# Patient Record
Sex: Female | Born: 1950 | Race: Black or African American | Hispanic: No | Marital: Married | Smoking: Former smoker
Health system: Southern US, Community
[De-identification: ages and names within clinical notes are randomized; demographics above are authoritative.]

## PROBLEM LIST (undated history)

## (undated) DIAGNOSIS — E119 Type 2 diabetes mellitus without complications: Secondary | ICD-10-CM

## (undated) DIAGNOSIS — Z8614 Personal history of Methicillin resistant Staphylococcus aureus infection: Secondary | ICD-10-CM

## (undated) DIAGNOSIS — C801 Malignant (primary) neoplasm, unspecified: Secondary | ICD-10-CM

## (undated) DIAGNOSIS — N183 Chronic kidney disease, stage 3 unspecified: Secondary | ICD-10-CM

## (undated) DIAGNOSIS — D649 Anemia, unspecified: Secondary | ICD-10-CM

## (undated) DIAGNOSIS — I10 Essential (primary) hypertension: Secondary | ICD-10-CM

## (undated) DIAGNOSIS — C55 Malignant neoplasm of uterus, part unspecified: Secondary | ICD-10-CM

## (undated) DIAGNOSIS — N189 Chronic kidney disease, unspecified: Secondary | ICD-10-CM

## (undated) DIAGNOSIS — M1990 Unspecified osteoarthritis, unspecified site: Secondary | ICD-10-CM

## (undated) DIAGNOSIS — I2510 Atherosclerotic heart disease of native coronary artery without angina pectoris: Secondary | ICD-10-CM

## (undated) DIAGNOSIS — I421 Obstructive hypertrophic cardiomyopathy: Secondary | ICD-10-CM

## (undated) DIAGNOSIS — E669 Obesity, unspecified: Secondary | ICD-10-CM

## (undated) DIAGNOSIS — L409 Psoriasis, unspecified: Secondary | ICD-10-CM

## (undated) DIAGNOSIS — I639 Cerebral infarction, unspecified: Secondary | ICD-10-CM

## (undated) DIAGNOSIS — K219 Gastro-esophageal reflux disease without esophagitis: Secondary | ICD-10-CM

## (undated) DIAGNOSIS — E785 Hyperlipidemia, unspecified: Secondary | ICD-10-CM

## (undated) HISTORY — DX: Hypercalcemia: E83.52

## (undated) HISTORY — DX: Essential (primary) hypertension: I10

## (undated) HISTORY — DX: Personal history of Methicillin resistant Staphylococcus aureus infection: Z86.14

## (undated) HISTORY — DX: Obesity, unspecified: E66.9

## (undated) HISTORY — DX: Malignant neoplasm of uterus, part unspecified: C55

## (undated) HISTORY — DX: Chronic kidney disease, stage 3 unspecified: N18.30

## (undated) HISTORY — PX: ABDOMINAL HYSTERECTOMY: SHX81

## (undated) HISTORY — PX: OTHER SURGICAL HISTORY: SHX169

## (undated) HISTORY — DX: Chronic kidney disease, unspecified: N18.9

## (undated) HISTORY — DX: Type 2 diabetes mellitus without complications: E11.9

## (undated) HISTORY — PX: PARTIAL HYSTERECTOMY: SHX80

## (undated) HISTORY — DX: Obstructive hypertrophic cardiomyopathy: I42.1

## (undated) HISTORY — DX: Psoriasis, unspecified: L40.9

## (undated) HISTORY — DX: Hyperlipidemia, unspecified: E78.5

---

## 2002-03-13 ENCOUNTER — Ambulatory Visit (HOSPITAL_COMMUNITY): Admission: RE | Admit: 2002-03-13 | Discharge: 2002-03-13 | Payer: Self-pay | Admitting: Internal Medicine

## 2002-09-25 ENCOUNTER — Ambulatory Visit (HOSPITAL_COMMUNITY): Admission: RE | Admit: 2002-09-25 | Discharge: 2002-09-25 | Payer: Self-pay | Admitting: Family Medicine

## 2002-09-25 ENCOUNTER — Encounter: Payer: Self-pay | Admitting: Family Medicine

## 2003-11-11 ENCOUNTER — Ambulatory Visit (HOSPITAL_COMMUNITY): Admission: RE | Admit: 2003-11-11 | Discharge: 2003-11-11 | Payer: Self-pay | Admitting: Family Medicine

## 2003-12-28 ENCOUNTER — Ambulatory Visit: Payer: Self-pay | Admitting: *Deleted

## 2003-12-29 ENCOUNTER — Ambulatory Visit (HOSPITAL_COMMUNITY): Admission: RE | Admit: 2003-12-29 | Discharge: 2003-12-29 | Payer: Self-pay | Admitting: Family Medicine

## 2003-12-30 ENCOUNTER — Ambulatory Visit: Payer: Self-pay | Admitting: *Deleted

## 2003-12-30 ENCOUNTER — Ambulatory Visit (HOSPITAL_COMMUNITY): Admission: RE | Admit: 2003-12-30 | Discharge: 2003-12-30 | Payer: Self-pay | Admitting: *Deleted

## 2003-12-31 ENCOUNTER — Ambulatory Visit: Payer: Self-pay | Admitting: Cardiology

## 2004-01-06 ENCOUNTER — Ambulatory Visit (HOSPITAL_COMMUNITY): Admission: RE | Admit: 2004-01-06 | Discharge: 2004-01-06 | Payer: Self-pay | Admitting: *Deleted

## 2004-01-06 ENCOUNTER — Ambulatory Visit: Payer: Self-pay | Admitting: *Deleted

## 2004-01-10 ENCOUNTER — Ambulatory Visit: Payer: Self-pay | Admitting: *Deleted

## 2004-01-11 ENCOUNTER — Inpatient Hospital Stay (HOSPITAL_BASED_OUTPATIENT_CLINIC_OR_DEPARTMENT_OTHER): Admission: RE | Admit: 2004-01-11 | Discharge: 2004-01-11 | Payer: Self-pay | Admitting: *Deleted

## 2004-01-25 ENCOUNTER — Ambulatory Visit: Payer: Self-pay | Admitting: *Deleted

## 2004-01-27 ENCOUNTER — Ambulatory Visit (HOSPITAL_COMMUNITY): Admission: RE | Admit: 2004-01-27 | Discharge: 2004-01-27 | Payer: Self-pay | Admitting: *Deleted

## 2004-02-09 ENCOUNTER — Ambulatory Visit: Payer: Self-pay | Admitting: Family Medicine

## 2004-03-10 ENCOUNTER — Ambulatory Visit: Payer: Self-pay | Admitting: Family Medicine

## 2004-04-21 ENCOUNTER — Ambulatory Visit: Payer: Self-pay | Admitting: Family Medicine

## 2004-05-01 ENCOUNTER — Ambulatory Visit: Payer: Self-pay | Admitting: Family Medicine

## 2004-05-15 ENCOUNTER — Ambulatory Visit: Payer: Self-pay | Admitting: Family Medicine

## 2004-06-07 ENCOUNTER — Ambulatory Visit: Payer: Self-pay | Admitting: Family Medicine

## 2004-06-14 ENCOUNTER — Ambulatory Visit: Payer: Self-pay | Admitting: *Deleted

## 2004-07-11 ENCOUNTER — Ambulatory Visit (HOSPITAL_COMMUNITY): Admission: RE | Admit: 2004-07-11 | Discharge: 2004-07-11 | Payer: Self-pay | Admitting: Nephrology

## 2004-08-29 ENCOUNTER — Ambulatory Visit: Payer: Self-pay | Admitting: *Deleted

## 2004-08-31 ENCOUNTER — Ambulatory Visit: Payer: Self-pay | Admitting: Family Medicine

## 2004-09-07 ENCOUNTER — Encounter (HOSPITAL_COMMUNITY): Admission: RE | Admit: 2004-09-07 | Discharge: 2004-10-07 | Payer: Self-pay | Admitting: *Deleted

## 2004-09-07 ENCOUNTER — Ambulatory Visit: Payer: Self-pay | Admitting: *Deleted

## 2004-09-21 ENCOUNTER — Ambulatory Visit: Payer: Self-pay | Admitting: *Deleted

## 2004-12-01 ENCOUNTER — Ambulatory Visit: Payer: Self-pay | Admitting: Family Medicine

## 2005-01-12 ENCOUNTER — Ambulatory Visit: Payer: Self-pay | Admitting: Family Medicine

## 2005-03-12 ENCOUNTER — Ambulatory Visit: Payer: Self-pay | Admitting: Family Medicine

## 2005-03-13 ENCOUNTER — Ambulatory Visit (HOSPITAL_COMMUNITY): Admission: RE | Admit: 2005-03-13 | Discharge: 2005-03-13 | Payer: Self-pay | Admitting: Family Medicine

## 2005-04-25 ENCOUNTER — Ambulatory Visit (HOSPITAL_COMMUNITY): Admission: RE | Admit: 2005-04-25 | Discharge: 2005-04-25 | Payer: Self-pay | Admitting: Orthopaedic Surgery

## 2005-06-05 ENCOUNTER — Ambulatory Visit: Payer: Self-pay | Admitting: Family Medicine

## 2005-08-08 ENCOUNTER — Other Ambulatory Visit: Admission: RE | Admit: 2005-08-08 | Discharge: 2005-08-08 | Payer: Self-pay | Admitting: Family Medicine

## 2005-08-08 ENCOUNTER — Ambulatory Visit: Payer: Self-pay | Admitting: Family Medicine

## 2005-08-08 LAB — CONVERTED CEMR LAB: Pap Smear: NORMAL

## 2005-08-24 ENCOUNTER — Ambulatory Visit (HOSPITAL_COMMUNITY): Admission: RE | Admit: 2005-08-24 | Discharge: 2005-08-24 | Payer: Self-pay | Admitting: Family Medicine

## 2005-08-24 ENCOUNTER — Ambulatory Visit: Payer: Self-pay | Admitting: Family Medicine

## 2005-08-31 ENCOUNTER — Ambulatory Visit: Payer: Self-pay | Admitting: Family Medicine

## 2005-10-03 ENCOUNTER — Ambulatory Visit: Payer: Self-pay | Admitting: Family Medicine

## 2005-10-31 ENCOUNTER — Ambulatory Visit: Payer: Self-pay | Admitting: Family Medicine

## 2006-01-02 ENCOUNTER — Ambulatory Visit: Payer: Self-pay | Admitting: Family Medicine

## 2006-03-15 ENCOUNTER — Ambulatory Visit (HOSPITAL_COMMUNITY): Admission: RE | Admit: 2006-03-15 | Discharge: 2006-03-15 | Payer: Self-pay | Admitting: Family Medicine

## 2006-04-12 ENCOUNTER — Encounter (INDEPENDENT_AMBULATORY_CARE_PROVIDER_SITE_OTHER): Payer: Self-pay | Admitting: Specialist

## 2006-04-12 ENCOUNTER — Ambulatory Visit (HOSPITAL_COMMUNITY): Admission: RE | Admit: 2006-04-12 | Discharge: 2006-04-12 | Payer: Self-pay | Admitting: Gastroenterology

## 2006-04-12 ENCOUNTER — Encounter: Payer: Self-pay | Admitting: Family Medicine

## 2006-04-12 ENCOUNTER — Ambulatory Visit: Payer: Self-pay | Admitting: Gastroenterology

## 2006-04-12 LAB — CONVERTED CEMR LAB
ALT: 32 units/L (ref 0–35)
AST: 27 units/L (ref 0–37)
Albumin: 4 g/dL (ref 3.5–5.2)
Alkaline Phosphatase: 69 units/L (ref 39–117)
BUN: 19 mg/dL (ref 6–23)
Bilirubin, Direct: 0.1 mg/dL (ref 0.0–0.3)
CO2: 25 meq/L (ref 19–32)
Calcium: 8.7 mg/dL (ref 8.4–10.5)
Chloride: 102 meq/L (ref 96–112)
Cholesterol: 139 mg/dL (ref 0–200)
Creatinine, Ser: 1.08 mg/dL (ref 0.40–1.20)
Glucose, Bld: 186 mg/dL — ABNORMAL HIGH (ref 70–99)
HDL: 30 mg/dL — ABNORMAL LOW (ref >39)
Hgb A1c MFr Bld: 8.3 % — ABNORMAL HIGH (ref 4.6–6.1)
Indirect Bilirubin: 0.3 mg/dL (ref 0.0–0.9)
LDL Cholesterol: 52 mg/dL (ref 0–99)
Potassium: 4.5 meq/L (ref 3.5–5.3)
Sodium: 139 meq/L (ref 135–145)
Total Bilirubin: 0.4 mg/dL (ref 0.3–1.2)
Total CHOL/HDL Ratio: 4.6
Total Protein: 6.6 g/dL (ref 6.0–8.3)
Triglycerides: 285 mg/dL — ABNORMAL HIGH (ref <150)
VLDL: 57 mg/dL — ABNORMAL HIGH (ref 0–40)

## 2006-04-12 LAB — HM COLONOSCOPY: HM Colonoscopy: NORMAL

## 2006-04-25 ENCOUNTER — Ambulatory Visit: Payer: Self-pay | Admitting: Family Medicine

## 2006-05-28 ENCOUNTER — Ambulatory Visit: Payer: Self-pay | Admitting: Family Medicine

## 2006-05-28 LAB — CONVERTED CEMR LAB
BUN: 57 mg/dL — ABNORMAL HIGH (ref 6–23)
CO2: 21 meq/L (ref 19–32)
Calcium: 9.6 mg/dL (ref 8.4–10.5)
Chloride: 104 meq/L (ref 96–112)
Creatinine, Ser: 1.86 mg/dL — ABNORMAL HIGH (ref 0.40–1.20)
Glucose, Bld: 168 mg/dL — ABNORMAL HIGH (ref 70–99)
Potassium: 4.7 meq/L (ref 3.5–5.3)
Sodium: 140 meq/L (ref 135–145)
Uric Acid, Serum: 7.6 mg/dL — ABNORMAL HIGH (ref 2.4–7.0)

## 2006-06-03 ENCOUNTER — Encounter: Payer: Self-pay | Admitting: Family Medicine

## 2006-06-03 LAB — CONVERTED CEMR LAB
BUN: 46 mg/dL — ABNORMAL HIGH (ref 6–23)
CO2: 23 meq/L (ref 19–32)
Calcium: 9.1 mg/dL (ref 8.4–10.5)
Chloride: 106 meq/L (ref 96–112)
Creatinine, Ser: 1.55 mg/dL — ABNORMAL HIGH (ref 0.40–1.20)
Glucose, Bld: 175 mg/dL — ABNORMAL HIGH (ref 70–99)
Potassium: 5 meq/L (ref 3.5–5.3)
Sodium: 137 meq/L (ref 135–145)
Uric Acid, Serum: 5.4 mg/dL (ref 2.4–7.0)

## 2006-07-12 ENCOUNTER — Ambulatory Visit: Payer: Self-pay | Admitting: Family Medicine

## 2006-07-13 ENCOUNTER — Encounter: Payer: Self-pay | Admitting: Family Medicine

## 2006-07-13 LAB — CONVERTED CEMR LAB
Candida species: NEGATIVE
Chlamydia, DNA Probe: NEGATIVE
GC Probe Amp, Genital: NEGATIVE
Gardnerella vaginalis: NEGATIVE
Trichomonal Vaginitis: NEGATIVE

## 2006-08-23 ENCOUNTER — Ambulatory Visit: Payer: Self-pay | Admitting: Family Medicine

## 2006-09-25 ENCOUNTER — Ambulatory Visit: Payer: Self-pay | Admitting: Family Medicine

## 2006-11-04 ENCOUNTER — Ambulatory Visit: Payer: Self-pay | Admitting: Family Medicine

## 2006-11-04 LAB — CONVERTED CEMR LAB: Microalb, Ur: 0.2 mg/dL (ref 0.00–1.89)

## 2007-01-07 ENCOUNTER — Ambulatory Visit: Payer: Self-pay | Admitting: Family Medicine

## 2007-02-27 ENCOUNTER — Encounter: Payer: Self-pay | Admitting: Family Medicine

## 2007-02-27 LAB — CONVERTED CEMR LAB
ALT: 38 units/L — ABNORMAL HIGH (ref 0–35)
AST: 24 units/L (ref 0–37)
Albumin: 4.2 g/dL (ref 3.5–5.2)
Alkaline Phosphatase: 69 units/L (ref 39–117)
BUN: 39 mg/dL — ABNORMAL HIGH (ref 6–23)
Basophils Absolute: 0 10*3/uL (ref 0.0–0.1)
Basophils Relative: 1 % (ref 0–1)
Bilirubin, Direct: 0.1 mg/dL (ref 0.0–0.3)
CO2: 25 meq/L (ref 19–32)
Calcium: 9.3 mg/dL (ref 8.4–10.5)
Chloride: 106 meq/L (ref 96–112)
Cholesterol: 148 mg/dL (ref 0–200)
Creatinine, Ser: 1.38 mg/dL — ABNORMAL HIGH (ref 0.40–1.20)
Eosinophils Absolute: 0.1 10*3/uL (ref 0.0–0.7)
Eosinophils Relative: 3 % (ref 0–5)
Glucose, Bld: 184 mg/dL — ABNORMAL HIGH (ref 70–99)
HCT: 38.3 % (ref 36.0–46.0)
HDL: 30 mg/dL — ABNORMAL LOW (ref >39)
Hemoglobin: 12 g/dL (ref 12.0–15.0)
Indirect Bilirubin: 0.2 mg/dL (ref 0.0–0.9)
LDL Cholesterol: 61 mg/dL (ref 0–99)
Lymphocytes Relative: 43 % (ref 12–46)
Lymphs Abs: 2.5 10*3/uL (ref 0.7–4.0)
MCHC: 31.3 g/dL (ref 30.0–36.0)
MCV: 91.6 fL (ref 78.0–100.0)
Monocytes Absolute: 0.7 10*3/uL (ref 0.1–1.0)
Monocytes Relative: 12 % (ref 3–12)
Neutro Abs: 2.4 10*3/uL (ref 1.7–7.7)
Neutrophils Relative %: 42 % — ABNORMAL LOW (ref 43–77)
Platelets: 181 10*3/uL (ref 150–400)
Potassium: 4.4 meq/L (ref 3.5–5.3)
RBC: 4.18 M/uL (ref 3.87–5.11)
RDW: 15.2 % (ref 11.5–15.5)
Sodium: 142 meq/L (ref 135–145)
Total Bilirubin: 0.3 mg/dL (ref 0.3–1.2)
Total CHOL/HDL Ratio: 4.9
Total Protein: 7 g/dL (ref 6.0–8.3)
Triglycerides: 285 mg/dL — ABNORMAL HIGH (ref <150)
VLDL: 57 mg/dL — ABNORMAL HIGH (ref 0–40)
WBC: 5.7 10*3/uL (ref 4.0–10.5)

## 2007-02-28 ENCOUNTER — Encounter: Payer: Self-pay | Admitting: Family Medicine

## 2007-02-28 LAB — CONVERTED CEMR LAB: Uric Acid, Serum: 7.1 mg/dL — ABNORMAL HIGH (ref 2.4–7.0)

## 2007-03-04 ENCOUNTER — Ambulatory Visit: Payer: Self-pay | Admitting: Family Medicine

## 2007-03-18 ENCOUNTER — Ambulatory Visit (HOSPITAL_COMMUNITY): Admission: RE | Admit: 2007-03-18 | Discharge: 2007-03-18 | Payer: Self-pay | Admitting: Family Medicine

## 2007-04-02 ENCOUNTER — Ambulatory Visit: Payer: Self-pay | Admitting: Family Medicine

## 2007-05-02 ENCOUNTER — Ambulatory Visit: Payer: Self-pay | Admitting: Family Medicine

## 2007-07-02 ENCOUNTER — Encounter: Payer: Self-pay | Admitting: Family Medicine

## 2007-07-02 DIAGNOSIS — E782 Mixed hyperlipidemia: Secondary | ICD-10-CM | POA: Insufficient documentation

## 2007-07-02 DIAGNOSIS — E785 Hyperlipidemia, unspecified: Secondary | ICD-10-CM

## 2007-07-02 DIAGNOSIS — E669 Obesity, unspecified: Secondary | ICD-10-CM | POA: Insufficient documentation

## 2007-07-02 DIAGNOSIS — I10 Essential (primary) hypertension: Secondary | ICD-10-CM | POA: Insufficient documentation

## 2007-07-03 ENCOUNTER — Ambulatory Visit: Payer: Self-pay | Admitting: Family Medicine

## 2007-08-29 ENCOUNTER — Encounter: Payer: Self-pay | Admitting: Family Medicine

## 2007-08-29 LAB — CONVERTED CEMR LAB
ALT: 36 units/L — ABNORMAL HIGH (ref 0–35)
AST: 30 units/L (ref 0–37)
Albumin: 4.2 g/dL (ref 3.5–5.2)
Alkaline Phosphatase: 67 units/L (ref 39–117)
BUN: 36 mg/dL — ABNORMAL HIGH (ref 6–23)
Bilirubin, Direct: 0.1 mg/dL (ref 0.0–0.3)
CO2: 24 meq/L (ref 19–32)
Calcium: 9.2 mg/dL (ref 8.4–10.5)
Chloride: 104 meq/L (ref 96–112)
Cholesterol: 135 mg/dL (ref 0–200)
Creatinine, Ser: 1.48 mg/dL — ABNORMAL HIGH (ref 0.40–1.20)
Glucose, Bld: 193 mg/dL — ABNORMAL HIGH (ref 70–99)
HDL: 29 mg/dL — ABNORMAL LOW (ref >39)
Indirect Bilirubin: 0.2 mg/dL (ref 0.0–0.9)
LDL Cholesterol: 32 mg/dL (ref 0–99)
Potassium: 4.7 meq/L (ref 3.5–5.3)
Sodium: 141 meq/L (ref 135–145)
Total Bilirubin: 0.3 mg/dL (ref 0.3–1.2)
Total Protein: 6.8 g/dL (ref 6.0–8.3)
Triglycerides: 370 mg/dL — ABNORMAL HIGH (ref <150)
VLDL: 74 mg/dL — ABNORMAL HIGH (ref 0–40)

## 2007-09-03 ENCOUNTER — Encounter: Payer: Self-pay | Admitting: Family Medicine

## 2007-09-03 ENCOUNTER — Other Ambulatory Visit: Admission: RE | Admit: 2007-09-03 | Discharge: 2007-09-03 | Payer: Self-pay | Admitting: Family Medicine

## 2007-09-03 ENCOUNTER — Ambulatory Visit: Payer: Self-pay | Admitting: Family Medicine

## 2007-09-03 LAB — CONVERTED CEMR LAB
Glucose, Bld: 197 mg/dL
OCCULT 1: NEGATIVE
Pap Smear: NORMAL
Uric Acid, Serum: 7.4 mg/dL — ABNORMAL HIGH (ref 2.4–7.0)

## 2007-09-11 ENCOUNTER — Encounter: Payer: Self-pay | Admitting: Family Medicine

## 2007-10-28 ENCOUNTER — Ambulatory Visit: Payer: Self-pay | Admitting: Family Medicine

## 2007-10-28 LAB — CONVERTED CEMR LAB
Glucose, Bld: 220 mg/dL
Hgb A1c MFr Bld: 7.8 %

## 2008-01-01 ENCOUNTER — Ambulatory Visit: Payer: Self-pay | Admitting: Family Medicine

## 2008-01-01 LAB — CONVERTED CEMR LAB: Glucose, Bld: 230 mg/dL

## 2008-01-26 ENCOUNTER — Telehealth: Payer: Self-pay | Admitting: Family Medicine

## 2008-02-05 ENCOUNTER — Ambulatory Visit: Payer: Self-pay | Admitting: Family Medicine

## 2008-02-05 LAB — CONVERTED CEMR LAB
Glucose, Bld: 215 mg/dL
Hgb A1c MFr Bld: 8.3 %

## 2008-02-06 ENCOUNTER — Encounter: Payer: Self-pay | Admitting: Family Medicine

## 2008-02-06 LAB — CONVERTED CEMR LAB
ALT: 38 units/L — ABNORMAL HIGH (ref 0–35)
AST: 24 units/L (ref 0–37)
Albumin: 4.1 g/dL (ref 3.5–5.2)
Alkaline Phosphatase: 72 units/L (ref 39–117)
BUN: 39 mg/dL — ABNORMAL HIGH (ref 6–23)
Bilirubin, Direct: 0.1 mg/dL (ref 0.0–0.3)
CO2: 24 meq/L (ref 19–32)
Calcium: 9.3 mg/dL (ref 8.4–10.5)
Chloride: 102 meq/L (ref 96–112)
Cholesterol: 132 mg/dL (ref 0–200)
Creatinine, Ser: 1.55 mg/dL — ABNORMAL HIGH (ref 0.40–1.20)
Creatinine, Urine: 36.5 mg/dL
Glucose, Bld: 169 mg/dL — ABNORMAL HIGH (ref 70–99)
HDL: 28 mg/dL — ABNORMAL LOW (ref >39)
Indirect Bilirubin: 0.3 mg/dL (ref 0.0–0.9)
LDL Cholesterol: 45 mg/dL (ref 0–99)
Microalb Creat Ratio: 5.5 mg/g (ref 0.0–30.0)
Microalb, Ur: 0.2 mg/dL (ref 0.00–1.89)
Potassium: 4.4 meq/L (ref 3.5–5.3)
Sodium: 139 meq/L (ref 135–145)
TSH: 2.552 microintl units/mL (ref 0.350–4.50)
Total Bilirubin: 0.4 mg/dL (ref 0.3–1.2)
Total CHOL/HDL Ratio: 4.7
Total Protein: 6.7 g/dL (ref 6.0–8.3)
Triglycerides: 294 mg/dL — ABNORMAL HIGH (ref <150)
VLDL: 59 mg/dL — ABNORMAL HIGH (ref 0–40)

## 2008-03-16 ENCOUNTER — Ambulatory Visit: Payer: Self-pay | Admitting: Family Medicine

## 2008-03-16 LAB — CONVERTED CEMR LAB: Glucose, Bld: 176 mg/dL

## 2008-05-26 ENCOUNTER — Ambulatory Visit: Payer: Self-pay | Admitting: Family Medicine

## 2008-05-26 DIAGNOSIS — B369 Superficial mycosis, unspecified: Secondary | ICD-10-CM | POA: Insufficient documentation

## 2008-05-26 LAB — CONVERTED CEMR LAB
Glucose, Bld: 172 mg/dL
Hgb A1c MFr Bld: 7.4 %

## 2008-05-30 DIAGNOSIS — F329 Major depressive disorder, single episode, unspecified: Secondary | ICD-10-CM | POA: Insufficient documentation

## 2008-05-30 DIAGNOSIS — F3289 Other specified depressive episodes: Secondary | ICD-10-CM | POA: Insufficient documentation

## 2008-07-28 ENCOUNTER — Ambulatory Visit: Payer: Self-pay | Admitting: Family Medicine

## 2008-07-28 DIAGNOSIS — T148XXA Other injury of unspecified body region, initial encounter: Secondary | ICD-10-CM | POA: Insufficient documentation

## 2008-07-28 LAB — CONVERTED CEMR LAB
Glucose, Bld: 225 mg/dL
Hgb A1c MFr Bld: 7.1 %

## 2008-08-09 ENCOUNTER — Encounter: Payer: Self-pay | Admitting: Family Medicine

## 2008-09-17 ENCOUNTER — Encounter: Payer: Self-pay | Admitting: Family Medicine

## 2008-09-21 ENCOUNTER — Encounter: Payer: Self-pay | Admitting: Family Medicine

## 2008-09-21 LAB — CONVERTED CEMR LAB
ALT: 41 units/L — ABNORMAL HIGH (ref 0–35)
AST: 28 units/L (ref 0–37)
Albumin: 3.9 g/dL (ref 3.5–5.2)
Alkaline Phosphatase: 65 units/L (ref 39–117)
BUN: 38 mg/dL — ABNORMAL HIGH (ref 6–23)
Basophils Absolute: 0.1 10*3/uL (ref 0.0–0.1)
Basophils Relative: 1 % (ref 0–1)
Bilirubin, Direct: 0.1 mg/dL (ref 0.0–0.3)
CO2: 27 meq/L (ref 19–32)
Calcium: 8.5 mg/dL (ref 8.4–10.5)
Chloride: 106 meq/L (ref 96–112)
Cholesterol: 117 mg/dL (ref 0–200)
Creatinine, Ser: 1.43 mg/dL — ABNORMAL HIGH (ref 0.40–1.20)
Eosinophils Absolute: 0.2 10*3/uL (ref 0.0–0.7)
Eosinophils Relative: 5 % (ref 0–5)
Glucose, Bld: 150 mg/dL — ABNORMAL HIGH (ref 70–99)
HCT: 36.8 % (ref 36.0–46.0)
HDL: 28 mg/dL — ABNORMAL LOW (ref >39)
Hemoglobin: 11.2 g/dL — ABNORMAL LOW (ref 12.0–15.0)
Indirect Bilirubin: 0.2 mg/dL (ref 0.0–0.9)
LDL Cholesterol: 43 mg/dL (ref 0–99)
Lymphocytes Relative: 41 % (ref 12–46)
Lymphs Abs: 2 10*3/uL (ref 0.7–4.0)
MCHC: 30.4 g/dL (ref 30.0–36.0)
MCV: 93.9 fL (ref 78.0–100.0)
Monocytes Absolute: 0.6 10*3/uL (ref 0.1–1.0)
Monocytes Relative: 12 % (ref 3–12)
Neutro Abs: 2.1 10*3/uL (ref 1.7–7.7)
Neutrophils Relative %: 42 % — ABNORMAL LOW (ref 43–77)
Platelets: 178 10*3/uL (ref 150–400)
Potassium: 4.5 meq/L (ref 3.5–5.3)
RBC: 3.92 M/uL (ref 3.87–5.11)
RDW: 16.1 % — ABNORMAL HIGH (ref 11.5–15.5)
Sodium: 143 meq/L (ref 135–145)
Total Bilirubin: 0.3 mg/dL (ref 0.3–1.2)
Total Protein: 6.4 g/dL (ref 6.0–8.3)
Triglycerides: 231 mg/dL — ABNORMAL HIGH (ref <150)
Uric Acid, Serum: 6 mg/dL (ref 2.4–7.0)
VLDL: 46 mg/dL — ABNORMAL HIGH (ref 0–40)
WBC: 5 10*3/uL (ref 4.0–10.5)

## 2008-09-22 ENCOUNTER — Ambulatory Visit: Payer: Self-pay | Admitting: Family Medicine

## 2008-09-22 LAB — CONVERTED CEMR LAB: Glucose, Bld: 305 mg/dL

## 2008-10-20 ENCOUNTER — Telehealth: Payer: Self-pay | Admitting: Family Medicine

## 2008-10-20 ENCOUNTER — Encounter: Payer: Self-pay | Admitting: Family Medicine

## 2008-10-30 ENCOUNTER — Emergency Department (HOSPITAL_COMMUNITY): Admission: EM | Admit: 2008-10-30 | Discharge: 2008-10-30 | Payer: Self-pay | Admitting: Emergency Medicine

## 2008-11-04 ENCOUNTER — Ambulatory Visit: Payer: Self-pay | Admitting: Family Medicine

## 2008-11-04 DIAGNOSIS — H60339 Swimmer's ear, unspecified ear: Secondary | ICD-10-CM | POA: Insufficient documentation

## 2008-11-04 DIAGNOSIS — E790 Hyperuricemia without signs of inflammatory arthritis and tophaceous disease: Secondary | ICD-10-CM | POA: Insufficient documentation

## 2008-11-04 LAB — CONVERTED CEMR LAB: Glucose, Bld: 182 mg/dL

## 2008-11-09 ENCOUNTER — Ambulatory Visit (HOSPITAL_COMMUNITY): Admission: RE | Admit: 2008-11-09 | Discharge: 2008-11-09 | Payer: Self-pay | Admitting: Family Medicine

## 2008-11-24 ENCOUNTER — Encounter: Payer: Self-pay | Admitting: Family Medicine

## 2008-12-31 ENCOUNTER — Encounter: Payer: Self-pay | Admitting: Family Medicine

## 2009-01-03 ENCOUNTER — Ambulatory Visit: Payer: Self-pay | Admitting: Family Medicine

## 2009-01-03 LAB — CONVERTED CEMR LAB
Glucose, Bld: 235 mg/dL
Hgb A1c MFr Bld: 7.6 %

## 2009-01-05 ENCOUNTER — Encounter: Payer: Self-pay | Admitting: Family Medicine

## 2009-01-10 ENCOUNTER — Encounter: Payer: Self-pay | Admitting: Family Medicine

## 2009-01-11 ENCOUNTER — Encounter: Payer: Self-pay | Admitting: Family Medicine

## 2009-01-12 ENCOUNTER — Telehealth: Payer: Self-pay | Admitting: Family Medicine

## 2009-01-12 ENCOUNTER — Encounter: Payer: Self-pay | Admitting: Family Medicine

## 2009-02-03 ENCOUNTER — Encounter: Payer: Self-pay | Admitting: Family Medicine

## 2009-03-01 DIAGNOSIS — Z8614 Personal history of Methicillin resistant Staphylococcus aureus infection: Secondary | ICD-10-CM

## 2009-03-01 HISTORY — DX: Personal history of Methicillin resistant Staphylococcus aureus infection: Z86.14

## 2009-03-08 ENCOUNTER — Telehealth: Payer: Self-pay | Admitting: Family Medicine

## 2009-03-09 ENCOUNTER — Ambulatory Visit: Payer: Self-pay | Admitting: Family Medicine

## 2009-03-09 DIAGNOSIS — J209 Acute bronchitis, unspecified: Secondary | ICD-10-CM | POA: Insufficient documentation

## 2009-05-02 ENCOUNTER — Encounter: Payer: Self-pay | Admitting: Family Medicine

## 2009-05-02 LAB — CONVERTED CEMR LAB
ALT: 21 units/L (ref 0–35)
AST: 19 units/L (ref 0–37)
Albumin: 4.1 g/dL (ref 3.5–5.2)
CO2: 29 meq/L (ref 19–32)
Calcium: 9.5 mg/dL (ref 8.4–10.5)
HDL: 26 mg/dL — ABNORMAL LOW (ref >39)
Sodium: 144 meq/L (ref 135–145)
Total Bilirubin: 0.4 mg/dL (ref 0.3–1.2)
Total CHOL/HDL Ratio: 5.1
Uric Acid, Serum: 6.6 mg/dL (ref 2.4–7.0)

## 2009-05-04 ENCOUNTER — Ambulatory Visit: Payer: Self-pay | Admitting: Family Medicine

## 2009-05-05 ENCOUNTER — Encounter: Payer: Self-pay | Admitting: Family Medicine

## 2009-05-05 LAB — CONVERTED CEMR LAB
Creatinine, Urine: 35.7 mg/dL
Microalb Creat Ratio: 14 mg/g
Microalb, Ur: 0.5 mg/dL

## 2009-05-06 ENCOUNTER — Telehealth: Payer: Self-pay | Admitting: Family Medicine

## 2009-05-09 ENCOUNTER — Encounter: Payer: Self-pay | Admitting: Family Medicine

## 2009-05-27 ENCOUNTER — Encounter: Payer: Self-pay | Admitting: Family Medicine

## 2009-07-14 ENCOUNTER — Ambulatory Visit: Payer: Self-pay | Admitting: Family Medicine

## 2009-07-14 DIAGNOSIS — L919 Hypertrophic disorder of the skin, unspecified: Secondary | ICD-10-CM

## 2009-07-14 DIAGNOSIS — L909 Atrophic disorder of skin, unspecified: Secondary | ICD-10-CM | POA: Insufficient documentation

## 2009-08-19 ENCOUNTER — Encounter: Payer: Self-pay | Admitting: Family Medicine

## 2009-09-06 ENCOUNTER — Ambulatory Visit: Payer: Self-pay | Admitting: Family Medicine

## 2009-09-06 ENCOUNTER — Other Ambulatory Visit: Admission: RE | Admit: 2009-09-06 | Discharge: 2009-09-06 | Payer: Self-pay | Admitting: Family Medicine

## 2009-09-06 DIAGNOSIS — G4730 Sleep apnea, unspecified: Secondary | ICD-10-CM | POA: Insufficient documentation

## 2009-09-06 DIAGNOSIS — R5381 Other malaise: Secondary | ICD-10-CM | POA: Insufficient documentation

## 2009-09-06 DIAGNOSIS — R5383 Other fatigue: Secondary | ICD-10-CM | POA: Insufficient documentation

## 2009-09-06 DIAGNOSIS — R609 Edema, unspecified: Secondary | ICD-10-CM | POA: Insufficient documentation

## 2009-09-06 LAB — CONVERTED CEMR LAB: OCCULT 1: NEGATIVE

## 2009-09-09 ENCOUNTER — Encounter: Payer: Self-pay | Admitting: Family Medicine

## 2009-09-15 ENCOUNTER — Encounter: Payer: Self-pay | Admitting: Family Medicine

## 2009-09-15 LAB — CONVERTED CEMR LAB: Pap Smear: NEGATIVE

## 2009-11-07 ENCOUNTER — Ambulatory Visit: Payer: Self-pay | Admitting: Family Medicine

## 2009-11-07 DIAGNOSIS — N183 Chronic kidney disease, stage 3 unspecified: Secondary | ICD-10-CM | POA: Insufficient documentation

## 2009-11-07 LAB — CONVERTED CEMR LAB
Albumin: 4.1 g/dL (ref 3.5–5.2)
Alkaline Phosphatase: 90 units/L (ref 39–117)
BUN: 65 mg/dL — ABNORMAL HIGH (ref 6–23)
CO2: 29 meq/L (ref 19–32)
Chloride: 102 meq/L (ref 96–112)
Creatinine, Ser: 1.97 mg/dL — ABNORMAL HIGH (ref 0.40–1.20)
Hemoglobin: 11.6 g/dL — ABNORMAL LOW (ref 12.0–15.0)
Hgb A1c MFr Bld: 7.9 % — ABNORMAL HIGH (ref <5.7)
Indirect Bilirubin: 0.2 mg/dL (ref 0.0–0.9)
LDL Cholesterol: 14 mg/dL (ref 0–99)
Lymphocytes Relative: 30 % (ref 12–46)
Lymphs Abs: 2.4 10*3/uL (ref 0.7–4.0)
Monocytes Absolute: 1.1 10*3/uL — ABNORMAL HIGH (ref 0.1–1.0)
Monocytes Relative: 15 % — ABNORMAL HIGH (ref 3–12)
Neutro Abs: 3.9 10*3/uL (ref 1.7–7.7)
RBC: 3.95 M/uL (ref 3.87–5.11)
TSH: 2.863 microintl units/mL (ref 0.350–4.500)
Total Bilirubin: 0.3 mg/dL (ref 0.3–1.2)
VLDL: 67 mg/dL — ABNORMAL HIGH (ref 0–40)
WBC: 7.8 10*3/uL (ref 4.0–10.5)

## 2009-11-14 ENCOUNTER — Ambulatory Visit (HOSPITAL_COMMUNITY): Admission: RE | Admit: 2009-11-14 | Discharge: 2009-11-14 | Payer: Self-pay | Admitting: Family Medicine

## 2009-12-12 ENCOUNTER — Ambulatory Visit (HOSPITAL_COMMUNITY): Admission: RE | Admit: 2009-12-12 | Discharge: 2009-12-12 | Payer: Self-pay | Admitting: Nephrology

## 2009-12-14 ENCOUNTER — Encounter: Payer: Self-pay | Admitting: Family Medicine

## 2009-12-15 ENCOUNTER — Encounter: Payer: Self-pay | Admitting: Family Medicine

## 2010-01-02 ENCOUNTER — Ambulatory Visit: Payer: Self-pay | Admitting: Family Medicine

## 2010-01-04 ENCOUNTER — Telehealth (INDEPENDENT_AMBULATORY_CARE_PROVIDER_SITE_OTHER): Payer: Self-pay | Admitting: *Deleted

## 2010-01-05 ENCOUNTER — Encounter: Payer: Self-pay | Admitting: Family Medicine

## 2010-01-06 ENCOUNTER — Encounter: Payer: Self-pay | Admitting: Family Medicine

## 2010-01-29 HISTORY — PX: APPENDECTOMY: SHX54

## 2010-02-18 ENCOUNTER — Encounter: Payer: Self-pay | Admitting: Family Medicine

## 2010-02-20 ENCOUNTER — Encounter: Payer: Self-pay | Admitting: Family Medicine

## 2010-02-27 ENCOUNTER — Ambulatory Visit
Admission: RE | Admit: 2010-02-27 | Discharge: 2010-02-27 | Payer: Self-pay | Source: Home / Self Care | Attending: Family Medicine | Admitting: Family Medicine

## 2010-02-27 DIAGNOSIS — L989 Disorder of the skin and subcutaneous tissue, unspecified: Secondary | ICD-10-CM | POA: Insufficient documentation

## 2010-02-27 LAB — HM DIABETES FOOT EXAM

## 2010-02-28 ENCOUNTER — Encounter: Payer: Self-pay | Admitting: Family Medicine

## 2010-02-28 NOTE — Assessment & Plan Note (Signed)
Summary: OV   Vital Signs:  Patient profile:   60 year old female Menstrual status:  hysterectomy Height:      66 inches Weight:      251.50 pounds BMI:     40.74 O2 Sat:      94 % on Room air Pulse rate:   75 / minute Pulse rhythm:   regular Resp:     16 per minute BP sitting:   130 / 62  (left arm)  Vitals Entered By: Jimmey Ralph LPN (February  9, 624THL 10:18 AM)  Nutrition Counseling: Patient's BMI is greater than 25 and therefore counseled on weight management options.  O2 Flow:  Room air CC: sore throat, hoarse Is Patient Diabetic? Yes Did you bring your meter with you today? No Pain Assessment Patient in pain? no        Primary Care Provider:  Tula Nakayama MD  CC:  sore throat and hoarse.  History of Present Illness: 2 day h/o sore throat and voice loss, with chest congestion and coughproductive of yellowsputum She has had chils, but no fever.  Pt has a lesion on the rifht forearm for 2 weeks which is not healing and which bled up until 1 night ago. she has alsonoted a new blistering lesion on her lowerabdomen, she actually lvs to see the dermatologist after this appt. She also continuesto c/o right ear discomfort, she has seen ENT for this  Current Medications (verified): 1)  Hydrocodone-Acetaminophen 7.5-325 Mg Tabs (Hydrocodone-Acetaminophen) .... One Tab Every 4 Hrs As Needed 2)  Benazepril Hcl 40 Mg  Tabs (Benazepril Hcl) .... Take 1 Tablet By Mouth Once A Day 3)  Klor-Con M10 10 Meq  Tbcr (Potassium Chloride Crys Cr) .... Take Four Tablets By Mouth Twice Daily 4)  Benicar 40 Mg  Tabs (Olmesartan Medoxomil) .... Take 1 Tablet By Mouth Once A Day 5)  Diltiazem Hcl Cr 180 Mg  Cp24 (Diltiazem Hcl) .... Take 1 Tablet By Mouth Once A Day 6)  Metoprolol Tartrate 100 Mg  Tabs (Metoprolol Tartrate) .... Take 1 Tablet By Mouth Two Times A Day 7)  Metolazone 5 Mg  Tabs (Metolazone) .... Take 1 Tablet By Mouth Once A Day 8)  Lasix 40 Mg  Tabs (Furosemide) .... Take  1 Tablet By Mouth Three Times A Day 9)  Pravastatin Sodium 40 Mg  Tabs (Pravastatin Sodium) .... Take Two Tablets By Mouth At Bedtime 10)  Glipizide 10 Mg  Tabs (Glipizide) .... Take 1 Tablet By Mouth Two Times A Day 11)  Allopurinol 300 Mg Tabs (Allopurinol) .... Take One Tab By Mouth Once Daily 12)  Naproxen 500 Mg Tabs (Naproxen) .... Take 1 Tablet By Mouth Two Times A Day 13)  Prodigy Glucose Meter .... Uad 14)  Lantus Solostar 100 Unit/ml Soln (Insulin Glargine) .Marland Kitchen.. 100 Units Daily 15)  Cyclobenzaprine Hcl 10 Mg Tabs (Cyclobenzaprine Hcl) .... One Tab By Mouth Three Times A Day As Needed  Allergies (verified): 1)  ! Pcn 2)  ! Flagyl 3)  ! Sulfa 4)  ! * Mobic  Review of Systems      See HPI General:  See HPI. Eyes:  Denies blurring and discharge. ENT:  Complains of sore throat; sore throat since yesterday. CV:  Denies chest pain or discomfort, palpitations, and swelling of feet. Resp:  Complains of cough and sputum productive; cough with yellow sputyum x 2 days, no nasal drainage, fever orr chills. GI:  Denies abdominal pain, constipation, diarrhea, nausea, and vomiting.  GU:  Denies dysuria and urinary frequency. MS:  Complains of joint pain. Derm:  Complains of lesion(s); skin lesiojn on right elbow x 2 weeks, not healing, and today  she noticed a new blister on a lower abd. Neuro:  Denies seizures and tremors. Psych:  Complains of anxiety; denies depression. Endo:  Denies excessive thirst, excessive urination, and polyuria; reports continued marked fluctuations in her blood sugars with fastings seldom under 155. Heme:  Denies abnormal bruising and bleeding. Allergy:  Denies hives or rash.  Physical Exam  General:  Well-developed,obese,in no acute distress; alert,appropriate and cooperative throughout examination. HEENT: No facial asymmetry,  EOMI, No sinus tenderness, TM's Clear, oropharynx  pink and moist.   Chest: decreased air entry , scattered crackles, no  wheezes CVS: S1, S2, No murmurs, No S3.   Abd: Soft, Nontender.  MS: Adequate ROM spine, hips, shoulders and knees.  Ext: No edema.   CNS: CN 2-12 intact, power tone and sensation normal throughout.   Skin: Intact, no visible lesions or rashes.  Psych: Good eye contact, normal affect.  Memory intact, not anxious or depressed appearing.    Impression & Recommendations:  Problem # 1:  ACUTE BRONCHITIS (ICD-466.0) Assessment Comment Only  Her updated medication list for this problem includes:    Doxycycline Hyclate 100 Mg Caps (Doxycycline hyclate) .Marland Kitchen... Take 1 capsule by mouth two times a day[    Tessalon Perles 100 Mg Caps (Benzonatate) .Marland Kitchen... Take 1 capsule by mouth three times a day  Problem # 2:  BLISTERS (ICD-919.2) Assessment: Comment Only pt has derm appt today  Problem # 3:  OBESITY (ICD-278.00) Assessment: Unchanged  Ht: 66 (03/09/2009)   Wt: 251.50 (03/09/2009)   BMI: 40.74 (03/09/2009)  Problem # 4:  HYPERTENSION (ICD-401.9) Assessment: Unchanged  Her updated medication list for this problem includes:    Benazepril Hcl 40 Mg Tabs (Benazepril hcl) .Marland Kitchen... Take 1 tablet by mouth once a day    Benicar 40 Mg Tabs (Olmesartan medoxomil) .Marland Kitchen... Take 1 tablet by mouth once a day    Diltiazem Hcl Cr 180 Mg Cp24 (Diltiazem hcl) .Marland Kitchen... Take 1 tablet by mouth once a day    Metoprolol Tartrate 100 Mg Tabs (Metoprolol tartrate) .Marland Kitchen... Take 1 tablet by mouth two times a day    Metolazone 5 Mg Tabs (Metolazone) .Marland Kitchen... Take 1 tablet by mouth once a day    Lasix 40 Mg Tabs (Furosemide) .Marland Kitchen... Take 1 tablet by mouth three times a day  BP today: 130/62 Prior BP: 130/70 (01/03/2009)  Labs Reviewed: K+: 4.5 (09/17/2008) Creat: : 1.43 (09/17/2008)   Chol: 117 (09/17/2008)   HDL: 28 (09/17/2008)   LDL: 43 (09/17/2008)   TG: 231 (09/17/2008)  Complete Medication List: 1)  Hydrocodone-acetaminophen 7.5-325 Mg Tabs (Hydrocodone-acetaminophen) .... One tab every 4 hrs as needed 2)  Benazepril  Hcl 40 Mg Tabs (Benazepril hcl) .... Take 1 tablet by mouth once a day 3)  Klor-con M10 10 Meq Tbcr (Potassium chloride crys cr) .... Take four tablets by mouth twice daily 4)  Benicar 40 Mg Tabs (Olmesartan medoxomil) .... Take 1 tablet by mouth once a day 5)  Diltiazem Hcl Cr 180 Mg Cp24 (Diltiazem hcl) .... Take 1 tablet by mouth once a day 6)  Metoprolol Tartrate 100 Mg Tabs (Metoprolol tartrate) .... Take 1 tablet by mouth two times a day 7)  Metolazone 5 Mg Tabs (Metolazone) .... Take 1 tablet by mouth once a day 8)  Lasix 40 Mg Tabs (Furosemide) .... Take  1 tablet by mouth three times a day 9)  Pravastatin Sodium 40 Mg Tabs (Pravastatin sodium) .... Take two tablets by mouth at bedtime 10)  Glipizide 10 Mg Tabs (Glipizide) .... Take 1 tablet by mouth two times a day 11)  Allopurinol 300 Mg Tabs (Allopurinol) .... Take one tab by mouth once daily 12)  Naproxen 500 Mg Tabs (Naproxen) .... Take 1 tablet by mouth two times a day 13)  Prodigy Glucose Meter  .... Uad 14)  Lantus Solostar 100 Unit/ml Soln (Insulin glargine) .Marland Kitchen.. 100 units daily 15)  Cyclobenzaprine Hcl 10 Mg Tabs (Cyclobenzaprine hcl) .... One tab by mouth three times a day as needed 16)  Doxycycline Hyclate 100 Mg Caps (Doxycycline hyclate) .... Take 1 capsule by mouth two times a day[ 17)  Tessalon Perles 100 Mg Caps (Benzonatate) .... Take 1 capsule by mouth three times a day  Patient Instructions: 1)  F/U as before. 2)  You are being treated with doxycycline for broncitis. 3)  Plas take all the med prescibed, 4)  The antibiotic is doxycycline for 10 days, this should also help yoour skin Prescriptions: TESSALON PERLES 100 MG CAPS (BENZONATATE) Take 1 capsule by mouth three times a day  #30 x 0   Entered and Authorized by:   Tula Nakayama MD   Signed by:   Tula Nakayama MD on 03/09/2009   Method used:   Electronically to        [facility] (retail)       [address]        [place], [place]  [address]       Ph: [phone]       Fax: [phone]   [id number] DOXYCYCLINE HYCLATE 100 MG CAPS (DOXYCYCLINE HYCLATE) Take 1 capsule by mouth two times a day[  #20 x 0   Entered and Authorized by:   Tula Nakayama MD   Signed by:   Tula Nakayama MD on 03/09/2009   Method used:   Electronically to        [facility] (retail)       [address]       [place], [place]  [address]       Ph: [phone]       Fax: [phone]   RxID:   [id number]

## 2010-02-28 NOTE — Letter (Signed)
Summary: MED REVIEW  MED REVIEW   Imported By: Dierdre Harness 11/07/2009 15:39:43  _____________________________________________________________________  External Attachment:    Type:   Image     Comment:   External Document

## 2010-02-28 NOTE — Progress Notes (Signed)
Summary: eye exam  eye exam   Imported By: Dierdre Harness 02/07/2009 15:28:49  _____________________________________________________________________  External Attachment:    Type:   Image     Comment:   External Document

## 2010-02-28 NOTE — Miscellaneous (Signed)
Summary: samples  Clinical Lists Changes     benicar samples given lot [id number] exp 01/14 lantus samples given

## 2010-02-28 NOTE — Letter (Signed)
Summary: nephrology care  nephrology care   Imported By: Dierdre Harness 01/06/2010 16:25:02  _____________________________________________________________________  External Attachment:    Type:   Image     Comment:   External Document

## 2010-02-28 NOTE — Assessment & Plan Note (Signed)
Summary: PHY   Vital Signs:  Patient profile:   60 year old female Menstrual status:  hysterectomy Height:      66 inches Weight:      252.25 pounds BMI:     40.86 O2 Sat:      97 % Pulse rate:   76 / minute Pulse rhythm:   regular Resp:     16 per minute BP sitting:   120 / 70  (left arm)  Vitals Entered By: Kate Sable LPN (August  9, 624THL 1:15 PM)  Nutrition Counseling: Patient's BMI is greater than 25 and therefore counseled on weight management options. CC: Follow up chronic problems   Primary Care Provider:  Tula Nakayama MD  CC:  Follow up chronic problems.  History of Present Illness: Reports  that she s dpoing fairly well, she reports excessive fatigue and dytime sl;eepiness. Denies recent fever or chills. Denies sinus pressure, nasal congestion , ear pain or sore throat. Denies chest congestion, or cough productive of sputum. Denies chest pain, palpitations, PND, orthopnea but has a 4 week h/o leg swelling. Denies abdominal pain, nausea, vomitting, diarrhea or constipation. Denies change in bowel movements or bloody stool. Denies dysuria , frequency, incontinence or hesitancy. Denies  joint pain, swelling, or reduced mobility. Denies headaches, vertigo, seizures. Denies depression, anxiety or insomnia. Denies  rash, lesions, or itch.     Allergies: 1)  ! Pcn 2)  ! Flagyl 3)  ! Sulfa 4)  ! * Mobic  Review of Systems      See HPI General:  Complains of fatigue and sleep disorder; excesssive daytime sleepiness and snoring with breath holding and fatigue. Eyes:  Denies blurring, discharge, eye pain, and red eye. CV:  Complains of swelling of feet; bilateral leg swelling since starting januvia. Endo:  Denies excessive thirst and excessive urination; still having uncontrolled blood sugars despite following her diet and taking meds as prescribed. Heme:  Denies abnormal bruising and bleeding. Allergy:  Denies hives or rash and itching eyes.  Physical  Exam  General:  Well-developed,obese,in no acute distress; alert,appropriate and cooperative throughout examination Head:  Normocephalic and atraumatic without obvious abnormalities. No apparent alopecia or balding. Eyes:  vision grossly intact, pupils equal, pupils round, and pupils reactive to light.   Ears:  External ear exam shows no significant lesions or deformities.  Otoscopic examination reveals clear canals, tympanic membranes are intact bilaterally without bulging, retraction, inflammation or discharge. Hearing is grossly normal bilaterally. Nose:  no external deformity, no external erythema, and no nasal discharge.   Mouth:  pharynx pink and moist, fair dentition, and poor dentition.   Neck:  No deformities, masses, or tenderness noted. Chest Wall:  No deformities, masses, or tenderness noted. Breasts:  No mass, nodules, thickening, tenderness, bulging, retraction, inflamation, nipple discharge or skin changes noted.   Lungs:  Normal respiratory effort, chest expands symmetrically. Lungs are clear to auscultation, no crackles or wheezes. Heart:  Normal rate and regular rhythm. S1 and S2 normal without gallop, murmur, click, rub or other extra sounds. Abdomen:  Bowel sounds positive,abdomen soft and non-tender without masses, organomegaly or hernias noted. Rectal:  No external abnormalities noted. Normal sphincter tone. No rectal masses or tenderness. Genitalia:  normal introitus, mucosa pink and moist, and no adnexal masses or tenderness.   Msk:  No deformity or scoliosis noted of thoracic or lumbar spine.   Pulses:  R and L carotid,radial,femoral,dorsalis pedis and posterior tibial pulses are full and equal bilaterally Extremities:  No  clubbing, cyanosis, edema, or deformity noted with normal full range of motion of all joints.   Neurologic:  No cranial nerve deficits noted. Station and gait are normal. Plantar reflexes are down-going bilaterally. DTRs are symmetrical throughout.  Sensory, motor and coordinative functions appear intact. Skin:  Intact multiple skin tags Cervical Nodes:  No lymphadenopathy noted Axillary Nodes:  No palpable lymphadenopathy Inguinal Nodes:  No significant adenopathy Psych:  Cognition and judgment appear intact. Alert and cooperative with normal attention span and concentration. No apparent delusions, illusions, hallucinations   Impression & Recommendations:  Problem # 1:  SPECIAL SCREENING FOR MALIGNANT NEOPLASMS COLON (ICD-V76.51) Assessment Comment Only  Orders: Hemoccult Guaiac-1 spec.(in office) ([id number])negative  Problem # 2:  SCREENING FOR MALIGNANT NEOPLASM OF THE CERVIX (ICD-V76.2) Assessment: Comment Only  Orders: Pap Smear [id number])  Problem # 3:  SLEEP APNEA (ICD-780.57) Assessment: Deteriorated  Orders: Sleep Disorder Referral (Sleep Disorder)  Problem # 4:  HYPERLIPIDEMIA (P102836.4) Assessment: Unchanged  Her updated medication list for this problem includes:    Pravastatin Sodium 40 Mg Tabs (Pravastatin sodium) .Marland Kitchen... Take two tablets by mouth at bedtime  Orders: T-Hepatic Function [phone]) T-Lipid Profile [phone])  Labs Reviewed: SGOT: 19 (04/29/2009)   SGPT: 21 (04/29/2009)   HDL:26 (04/29/2009), 28 (09/17/2008)  LDL:64 (04/29/2009), 43 (09/17/2008)  Chol:133 (04/29/2009), 117 (09/17/2008)  Trig:217 (04/29/2009), 231 (09/17/2008)  Problem # 5:  HYPERTENSION (ICD-401.9) Assessment: Unchanged  Her updated medication list for this problem includes:    Benazepril Hcl 40 Mg Tabs (Benazepril hcl) .Marland Kitchen... Take 1 tablet by mouth once a day    Benicar 40 Mg Tabs (Olmesartan medoxomil) .Marland Kitchen... Take 1 tablet by mouth once a day    Diltiazem Hcl Cr 180 Mg Cp24 (Diltiazem hcl) .Marland Kitchen... Take 1 tablet by mouth once a day    Metoprolol Tartrate 100 Mg Tabs (Metoprolol tartrate) .Marland Kitchen... Take 1 tablet by mouth two times a day    Metolazone 5 Mg Tabs (Metolazone) .Marland Kitchen... Take 1 tablet by mouth once a day    Lasix 40 Mg  Tabs (Furosemide) .Marland Kitchen... Take 1 tablet by mouth three times a day  Orders: T-Basic Metabolic Panel ([id number])  Prior BP: 124/60 (07/14/2009)  Labs Reviewed: K+: 4.6 (04/29/2009) Creat: : 1.48 (04/29/2009)   Chol: 133 (04/29/2009)   HDL: 26 (04/29/2009)   LDL: 64 (04/29/2009)   TG: 217 (04/29/2009)  Problem # 6:  DIABETES MELLITUS, TYPE II (ICD-250.00) Assessment: Improved  The following medications were removed from the medication list:    Januvia 50 Mg Tabs (Sitagliptin phosphate) .Marland Kitchen... Take 1 tablet by mouth once a day Her updated medication list for this problem includes:    Benazepril Hcl 40 Mg Tabs (Benazepril hcl) .Marland Kitchen... Take 1 tablet by mouth once a day    Benicar 40 Mg Tabs (Olmesartan medoxomil) .Marland Kitchen... Take 1 tablet by mouth once a day    Glipizide 10 Mg Tabs (Glipizide) .Marland Kitchen... Take 1 tablet by mouth two times a day    Lantus Solostar 100 Unit/ml Soln (Insulin glargine) .Marland KitchenMarland KitchenMarland KitchenMarland Kitchen 100 units daily  Orders: T- Hemoglobin A1C [id number])  Labs Reviewed: Creat: 1.48 (04/29/2009)    Reviewed HgBA1c results: 7.8 (08/02/2009)  7.9 (05/02/2009)  Complete Medication List: 1)  Hydrocodone-acetaminophen 7.5-325 Mg Tabs (Hydrocodone-acetaminophen) .... One tab every 4 hrs as needed 2)  Benazepril Hcl 40 Mg Tabs (Benazepril hcl) .... Take 1 tablet by mouth once a day 3)  Klor-con M10 10 Meq Tbcr (Potassium chloride crys cr) .... Take four tablets by mouth twice daily  4)  Benicar 40 Mg Tabs (Olmesartan medoxomil) .... Take 1 tablet by mouth once a day 5)  Diltiazem Hcl Cr 180 Mg Cp24 (Diltiazem hcl) .... Take 1 tablet by mouth once a day 6)  Metoprolol Tartrate 100 Mg Tabs (Metoprolol tartrate) .... Take 1 tablet by mouth two times a day 7)  Metolazone 5 Mg Tabs (Metolazone) .... Take 1 tablet by mouth once a day 8)  Lasix 40 Mg Tabs (Furosemide) .... Take 1 tablet by mouth three times a day 9)  Pravastatin Sodium 40 Mg Tabs (Pravastatin sodium) .... Take two tablets by mouth at  bedtime 10)  Glipizide 10 Mg Tabs (Glipizide) .... Take 1 tablet by mouth two times a day 11)  Allopurinol 300 Mg Tabs (Allopurinol) .... Take one tab by mouth once daily 12)  Naproxen 500 Mg Tabs (Naproxen) .... Take 1 tablet by mouth two times a day 13)  Prodigy Glucose Meter  .... Uad 14)  Lantus Solostar 100 Unit/ml Soln (Insulin glargine) .Marland Kitchen.. 100 units daily 15)  Cyclobenzaprine Hcl 10 Mg Tabs (Cyclobenzaprine hcl) .... One tab by mouth three times a day as needed 16)  Accu-chek Compact Strp (Glucose blood) .... Once daily testing  Other Orders: T-CBC w/Diff [id number]) T-TSH [phone]) T-Uric Acid (Blood) [id number]) Furosemide- Lasix Injection [id number]) Admin of Therapeutic Inj  intramuscular or subcutaneous [id number])  Patient Instructions: 1)  F/U second week in October. 2)  BMP prior to visit, ICD-9: 3)  Hepatic Panel prior to visit, ICD-9: 4)  Lipid Panel prior to visit, ICD-9:  fasting in October. 5)  You will be referred for a sleeep study as well as to see the diabetic educator. 6)  PLS STOP JANUVIA, 7)  You  may need to add an insulin mix , we will see how  you do when you start seeing the dietitian 8)    9)  TSH prior to visit, ICD-9: 10)  CBC w/ Diff prior to visit, ICD-9: 11)  HbgA1C prior to visit, ICD-9:   Medication Administration  Injection # 1:    Medication: Furosemide- Lasix Injection    Diagnosis: EDEMA (ICD-782.3)    Route: IM    Site: RUOQ gluteus    Exp Date: 03/2010    Lot #: [id number]     Mfr: [email or web address]    Comments: 10mg  given     Patient tolerated injection without complications    Given by: Kate Sable LPN (August  9, 624THL 3:19 PM)  Orders Added: 1)  Est. Patient 40-64 years [99396] 2)  T-Basic Metabolic Panel 0000000 3)  T-Hepatic Function [phone] 4)  T-Lipid Profile [id number] 5)  T-CBC w/Diff [id number] 6)  T-TSH [id number] 7)  T- Hemoglobin A1C [[id number]] 8)  T-Uric Acid (Blood) [id number] 9)   Furosemide- Lasix Injection [J1940] 10)  Admin of Therapeutic Inj  intramuscular or subcutaneous [96372] 11)  Sleep Disorder Referral [Sleep Disorder] 12)  Pap Smear [88150] 13)  Hemoccult Guaiac-1 spec.(in office) [id number]   Laboratory Results    Stool - Occult Blood Hemmoccult #1: negative Date: 09/06/2009 Comments: [id number] 1L 3/12 118 1012

## 2010-02-28 NOTE — Medication Information (Signed)
Summary: Visual merchandiser   Imported By: Dierdre Harness 09/09/2009 11:06:55  _____________________________________________________________________  External Attachment:    Type:   Image     Comment:   External Document

## 2010-02-28 NOTE — Letter (Signed)
Summary: Letter  Letter   Imported By: Dierdre Harness 09/16/2009 14:37:19  _____________________________________________________________________  External Attachment:    Type:   Image     Comment:   External Document

## 2010-02-28 NOTE — Assessment & Plan Note (Signed)
Summary: f up   Vital Signs:  Patient profile:   60 year old female Menstrual status:  hysterectomy Height:      66 inches Weight:      246 pounds BMI:     39.85 O2 Sat:      97 % Pulse rate:   73 / minute Pulse rhythm:   regular Resp:     16 per minute BP sitting:   124 / 60  (left arm) Cuff size:   large  Vitals Entered By: Kate Sable LPN (June 16, 624THL 075-GRM PM)  Nutrition Counseling: Patient's BMI is greater than 25 and therefore counseled on weight management options. CC: Follow up chronic problems   Primary Care Provider:  Tula Nakayama MD  CC:  Follow up chronic problems.  History of Present Illness: Reports  that she has been doing well. Denies recent fever or chills. Denies sinus pressure, nasal congestion , ear pain or sore throat. Denies chest congestion, or cough productive of sputum. Denies chest pain, palpitations, PND, orthopnea or leg swelling. Denies abdominal pain, nausea, vomitting, diarrhea or constipation. Denies change in bowel movements or bloody stool. Denies dysuria , frequency, incontinence or hesitancy. Denies  joint pain, swelling, or reduced mobility. Denies headaches, vertigo, seizures. Denies depression, anxiety or insomnia.    Current Medications (verified): 1)  Hydrocodone-Acetaminophen 7.5-325 Mg Tabs (Hydrocodone-Acetaminophen) .... One Tab Every 4 Hrs As Needed 2)  Benazepril Hcl 40 Mg  Tabs (Benazepril Hcl) .... Take 1 Tablet By Mouth Once A Day 3)  Klor-Con M10 10 Meq  Tbcr (Potassium Chloride Crys Cr) .... Take Four Tablets By Mouth Twice Daily 4)  Benicar 40 Mg  Tabs (Olmesartan Medoxomil) .... Take 1 Tablet By Mouth Once A Day 5)  Diltiazem Hcl Cr 180 Mg  Cp24 (Diltiazem Hcl) .... Take 1 Tablet By Mouth Once A Day 6)  Metoprolol Tartrate 100 Mg  Tabs (Metoprolol Tartrate) .... Take 1 Tablet By Mouth Two Times A Day 7)  Metolazone 5 Mg  Tabs (Metolazone) .... Take 1 Tablet By Mouth Once A Day 8)  Lasix 40 Mg  Tabs  (Furosemide) .... Take 1 Tablet By Mouth Three Times A Day 9)  Pravastatin Sodium 40 Mg  Tabs (Pravastatin Sodium) .... Take Two Tablets By Mouth At Bedtime 10)  Glipizide 10 Mg  Tabs (Glipizide) .... Take 1 Tablet By Mouth Two Times A Day 11)  Allopurinol 300 Mg Tabs (Allopurinol) .... Take One Tab By Mouth Once Daily 12)  Naproxen 500 Mg Tabs (Naproxen) .... Take 1 Tablet By Mouth Two Times A Day 13)  Prodigy Glucose Meter .... Uad 14)  Lantus Solostar 100 Unit/ml Soln (Insulin Glargine) .Marland Kitchen.. 100 Units Daily 15)  Cyclobenzaprine Hcl 10 Mg Tabs (Cyclobenzaprine Hcl) .... One Tab By Mouth Three Times A Day As Needed 16)  Accu-Chek Compact  Strp (Glucose Blood) .... Once Daily Testing 17)  Humulin 70/30 70-30 % Susp (Insulin Isophane & Regular) .... 30 Units Twice Daily  Allergies (verified): 1)  ! Pcn 2)  ! Flagyl 3)  ! Sulfa 4)  ! * Mobic  Review of Systems      See HPI Derm:  Complains of lesion(s) and rash; lesion on the back of her right elbow persits despite topical treatment from derm she will call back fopr re-eval, wants 4 skin tags removed from upper abdomen and back of neck. Endo:  Denies cold intolerance, excessive hunger, excessive thirst, excessive urination, heat intolerance, polyuria, and weight change; tests onse  or twice daily, did not fill humulin, fasting sugars avg about 90, and pre supper 135. Allergy:  Denies hives or rash and itching eyes.  Physical Exam  General:  Well-developed,obese,in no acute distress; alert,appropriate and cooperative throughout examination. HEENT: No facial asymmetry,  EOMI, No sinus tenderness, TM's Clear, oropharynx  pink and moist.   Chest: clear to ascultation, no crackles or wheezes CVS: S1, S2, No murmurs, No S3.   Abd: Soft, Nontender.  MS: Adequate ROM spine, hips, shoulders and knees.  Ext: No edema.   CNS: CN 2-12 intact, power tone and sensation normal throughout.   Skin: Intact,multiple ski tags, pigmented maculopapular rash  on right arm, post, with scaling Psych: Good eye contact, normal affect.  Memory intact, not anxious or depressed appearing.   Diabetes Management Exam:    Foot Exam (with socks and/or shoes not present):       Sensory-Monofilament:          Left foot: diminished          Right foot: diminished       Inspection:          Left foot: normal          Right foot: normal       Nails:          Left foot: thickened          Right foot: thickened   Impression & Recommendations:  Problem # 1:  SKIN TAG (ICD-701.9) Assessment Comment Only pt to return for removal of 4 tags, she has multiple others  Problem # 2:  GOUT, UNSPECIFIED (ICD-274.9) Assessment: Improved  Her updated medication list for this problem includes:    Allopurinol 300 Mg Tabs (Allopurinol) .Marland Kitchen... Take one tab by mouth once daily  Problem # 3:  OBESITY (ICD-278.00) Assessment: Unchanged  Ht: 66 (07/14/2009)   Wt: 246 (07/14/2009)   BMI: 39.85 (07/14/2009)  Problem # 4:  DIABETES MELLITUS, TYPE II (ICD-250.00) Assessment: Comment Only  The following medications were removed from the medication list:    Humulin 70/30 70-30 % Susp (Insulin isophane & regular) .Marland KitchenMarland KitchenMarland KitchenMarland Kitchen 30 units twice daily Her updated medication list for this problem includes:    Benazepril Hcl 40 Mg Tabs (Benazepril hcl) .Marland Kitchen... Take 1 tablet by mouth once a day    Benicar 40 Mg Tabs (Olmesartan medoxomil) .Marland Kitchen... Take 1 tablet by mouth once a day    Glipizide 10 Mg Tabs (Glipizide) .Marland Kitchen... Take 1 tablet by mouth two times a day    Lantus Solostar 100 Unit/ml Soln (Insulin glargine) .Marland KitchenMarland KitchenMarland KitchenMarland Kitchen 100 units daily  Orders: T- Hemoglobin A1C [id number]) Glucose, (CBG) [phone])  Labs Reviewed: Creat: 1.48 (04/29/2009)    Reviewed HgBA1c results: 7.9 (05/02/2009)  7.6 (01/03/2009)  Problem # 5:  HYPERTENSION (ICD-401.9) Assessment: Unchanged  Her updated medication list for this problem includes:    Benazepril Hcl 40 Mg Tabs (Benazepril hcl) .Marland Kitchen... Take 1  tablet by mouth once a day    Benicar 40 Mg Tabs (Olmesartan medoxomil) .Marland Kitchen... Take 1 tablet by mouth once a day    Diltiazem Hcl Cr 180 Mg Cp24 (Diltiazem hcl) .Marland Kitchen... Take 1 tablet by mouth once a day    Metoprolol Tartrate 100 Mg Tabs (Metoprolol tartrate) .Marland Kitchen... Take 1 tablet by mouth two times a day    Metolazone 5 Mg Tabs (Metolazone) .Marland Kitchen... Take 1 tablet by mouth once a day    Lasix 40 Mg Tabs (Furosemide) .Marland Kitchen... Take 1 tablet by mouth three times  a day  BP today: 124/60 Prior BP: 130/70 (05/04/2009)  Labs Reviewed: K+: 4.6 (04/29/2009) Creat: : 1.48 (04/29/2009)   Chol: 133 (04/29/2009)   HDL: 26 (04/29/2009)   LDL: 64 (04/29/2009)   TG: 217 (04/29/2009)  Complete Medication List: 1)  Hydrocodone-acetaminophen 7.5-325 Mg Tabs (Hydrocodone-acetaminophen) .... One tab every 4 hrs as needed 2)  Benazepril Hcl 40 Mg Tabs (Benazepril hcl) .... Take 1 tablet by mouth once a day 3)  Klor-con M10 10 Meq Tbcr (Potassium chloride crys cr) .... Take four tablets by mouth twice daily 4)  Benicar 40 Mg Tabs (Olmesartan medoxomil) .... Take 1 tablet by mouth once a day 5)  Diltiazem Hcl Cr 180 Mg Cp24 (Diltiazem hcl) .... Take 1 tablet by mouth once a day 6)  Metoprolol Tartrate 100 Mg Tabs (Metoprolol tartrate) .... Take 1 tablet by mouth two times a day 7)  Metolazone 5 Mg Tabs (Metolazone) .... Take 1 tablet by mouth once a day 8)  Lasix 40 Mg Tabs (Furosemide) .... Take 1 tablet by mouth three times a day 9)  Pravastatin Sodium 40 Mg Tabs (Pravastatin sodium) .... Take two tablets by mouth at bedtime 10)  Glipizide 10 Mg Tabs (Glipizide) .... Take 1 tablet by mouth two times a day 11)  Allopurinol 300 Mg Tabs (Allopurinol) .... Take one tab by mouth once daily 12)  Naproxen 500 Mg Tabs (Naproxen) .... Take 1 tablet by mouth two times a day 13)  Prodigy Glucose Meter  .... Uad 14)  Lantus Solostar 100 Unit/ml Soln (Insulin glargine) .Marland Kitchen.. 100 units daily 15)  Cyclobenzaprine Hcl 10 Mg Tabs  (Cyclobenzaprine hcl) .... One tab by mouth three times a day as needed 16)  Accu-chek Compact Strp (Glucose blood) .... Once daily testing  Patient Instructions: 1)  CPE August 5 or after. 2)  OV in 1 to 2 weeks removal of 4 tags 3)  HBA1C july 5 or after 4)  It is important that your Diabetic A1c level is checked every 3 months. 5)  It is important that you exercise regularly at least 20 minutes 5 times a week. If you develop chest pain, have severe difficulty breathing, or feel very tired , stop exercising immediately and seek medical attention. 6)  You need to lose weight. Consider a lower calorie diet and regular exercise.  7)  You need to call derm for an appt regarding your rash on the elbow  Laboratory Results   Blood Tests     Glucose (random): 226 mg/dL   (Normal Range: 70-105)

## 2010-02-28 NOTE — Letter (Signed)
Summary: medical release  medical release   Imported By: Dierdre Harness 05/27/2009 14:02:51  _____________________________________________________________________  External Attachment:    Type:   Image     Comment:   External Document

## 2010-02-28 NOTE — Progress Notes (Signed)
  Phone Note Call from Patient   Summary of Call: left message to pick up samples Initial call taken by: Dierdre Harness,  January 04, 2010 10:57 AM

## 2010-02-28 NOTE — Letter (Signed)
Summary: medical release  medical release   Imported By: Dierdre Harness 05/09/2009 13:00:33  _____________________________________________________________________  External Attachment:    Type:   Image     Comment:   External Document

## 2010-02-28 NOTE — Letter (Signed)
Summary: OUTPATIENT NUTRITIONAL CARE  OUTPATIENT NUTRITIONAL CARE   Imported By: Dierdre Harness 09/09/2009 11:12:33  _____________________________________________________________________  External Attachment:    Type:   Image     Comment:   External Document

## 2010-02-28 NOTE — Miscellaneous (Signed)
Summary: orders  Clinical Lists Changes  Orders: Added new Test order of T- Hemoglobin A1C [id number]) - Signed  Appended Document: orders Patient aware

## 2010-02-28 NOTE — Assessment & Plan Note (Signed)
Summary: F UP   Vital Signs:  Patient profile:   60 year old female Menstrual status:  hysterectomy Height:      66 inches Weight:      248.75 pounds BMI:     40.29 O2 Sat:      96 % on Room air Pulse rate:   76 / minute Pulse rhythm:   regular Resp:     16 per minute BP sitting:   120 / 60  (left arm)  Vitals Entered By: Baldomero Lamy LPN (October 10, 624THL 1:34 PM)  Nutrition Counseling: Patient's BMI is greater than 25 and therefore counseled on weight management options.  O2 Flow:  Room air CC: follow-up visit Is Patient Diabetic? Yes Did you bring your meter with you today? No Pain Assessment Patient in pain? no        Primary Care Provider:  Tula Nakayama MD  CC:  follow-up visit.  History of Present Illness: Reports  that she has been doing fairly well. she still reports uncontrolled blood sugars which fluctuate excessively. She did not fill the mixed insulin because of ost, however i advised that there was an affordable brand, and that she needed thisto get her sugars to goal. Denies recent fever or chills. Denies sinus pressure, nasal congestion , ear pain or sore throat. Denies chest congestion, or cough productive of sputum. Denies chest pain, palpitations, PND, orthopnea or leg swelling. Denies abdominal pain, nausea, vomitting, diarrhea or constipation. Denies change in bowel movements or bloody stool. Denies dysuria , frequency, incontinence or hesitancy. Denies  joint pain, swelling, or reduced mobility.No recent gout flare. Denies headaches, vertigo, seizures. Denies depression, or insomnia. Denies  rash, lesions, or itch.     Current Medications (verified): 1)  Hydrocodone-Acetaminophen 7.5-325 Mg Tabs (Hydrocodone-Acetaminophen) .... One Tab Every 4 Hrs As Needed 2)  Benazepril Hcl 40 Mg  Tabs (Benazepril Hcl) .... Take 1 Tablet By Mouth Once A Day 3)  Klor-Con M10 10 Meq  Tbcr (Potassium Chloride Crys Cr) .... Take Four Tablets By Mouth  Twice Daily 4)  Benicar 40 Mg  Tabs (Olmesartan Medoxomil) .... Take 1 Tablet By Mouth Once A Day 5)  Diltiazem Hcl Cr 180 Mg  Cp24 (Diltiazem Hcl) .... Take 1 Tablet By Mouth Once A Day 6)  Metoprolol Tartrate 100 Mg  Tabs (Metoprolol Tartrate) .... Take 1 Tablet By Mouth Two Times A Day 7)  Metolazone 5 Mg  Tabs (Metolazone) .... Take 1 Tablet By Mouth Once A Day 8)  Lasix 40 Mg  Tabs (Furosemide) .... Take 1 Tablet By Mouth Three Times A Day 9)  Pravastatin Sodium 40 Mg  Tabs (Pravastatin Sodium) .... Take Two Tablets By Mouth At Bedtime 10)  Glipizide 10 Mg  Tabs (Glipizide) .... Take 1 Tablet By Mouth Two Times A Day 11)  Allopurinol 300 Mg Tabs (Allopurinol) .... Take One Tab By Mouth Once Daily 12)  Naproxen 500 Mg Tabs (Naproxen) .... Take 1 Tablet By Mouth Two Times A Day 13)  Prodigy Glucose Meter .... Uad 14)  Lantus Solostar 100 Unit/ml Soln (Insulin Glargine) .Marland Kitchen.. 100 Units Daily 15)  Cyclobenzaprine Hcl 10 Mg Tabs (Cyclobenzaprine Hcl) .... One Tab By Mouth Three Times A Day As Needed 16)  Accu-Chek Compact  Strp (Glucose Blood) .... Once Daily Testing  Allergies (verified): 1)  ! Pcn 2)  ! Flagyl 3)  ! Sulfa 4)  ! * Mobic  Review of Systems      See HPI General:  Complains of fatigue. Eyes:  Denies blurring, discharge, eye pain, and red eye. Endo:  Denies excessive thirst and excessive urination; uncontrolled blood sugars. Heme:  Denies abnormal bruising and bleeding. Allergy:  Complains of seasonal allergies.  Physical Exam  General:  Well-developed,obese,in no acute distress; alert,appropriate and cooperative throughout examination HEENT: No facial asymmetry,  EOMI, No sinus tenderness, TM's Clear, oropharynx  pink and moist.   Chest: Clear to auscultation bilaterally.  CVS: S1, S2, No murmurs, No S3.   Abd: Soft, Nontender.  MS: Adequate ROM spine, hips, shoulders and knees.  Ext: No edema.   CNS: CN 2-12 intact, power tone and sensation normal throughout.    Skin: Intact, no visible lesions or rashes.  Psych: Good eye contact, normal affect.  Memory intact, not anxious or depressed appearing.   Diabetes Management Exam:    Foot Exam (with socks and/or shoes not present):       Sensory-Monofilament:          Left foot: diminished          Right foot: diminished       Inspection:          Left foot: normal          Right foot: normal       Nails:          Left foot: thickened          Right foot: thickened   Impression & Recommendations:  Problem # 1:  OBESITY (ICD-278.00) Assessment Deteriorated  Ht: 66 (11/07/2009)   Wt: 248.75 (11/07/2009)   BMI: 40.29 (11/07/2009) therapeutic lifestyle change discussed and encouraged  Problem # 2:  HYPERTENSION (ICD-401.9) Assessment: Unchanged  The following medications were removed from the medication list:    Lasix 40 Mg Tabs (Furosemide) .Marland Kitchen... Take 1 tablet by mouth three times a day Her updated medication list for this problem includes:    Benazepril Hcl 40 Mg Tabs (Benazepril hcl) .Marland Kitchen... Take 1 tablet by mouth once a day    Benicar 40 Mg Tabs (Olmesartan medoxomil) .Marland Kitchen... Take 1 tablet by mouth once a day    Diltiazem Hcl Cr 180 Mg Cp24 (Diltiazem hcl) .Marland Kitchen... Take 1 tablet by mouth once a day    Metoprolol Tartrate 100 Mg Tabs (Metoprolol tartrate) .Marland Kitchen... Take 1 tablet by mouth two times a day    Metolazone 5 Mg Tabs (Metolazone) .Marland Kitchen... Take 1 tablet by mouth once a day    Furosemide 40 Mg Tabs (Furosemide) .Marland Kitchen... Take 1 tablet by mouth two times a day  Orders: Nephrology Referral (Nephro)  BP today: 120/60 Prior BP: 120/70 (09/06/2009)  Labs Reviewed: K+: 4.5 (11/03/2009) Creat: : 1.97 (11/03/2009)   Chol: 105 (11/03/2009)   HDL: 24 (11/03/2009)   LDL: 14 (11/03/2009)   TG: 336 (11/03/2009)  Problem # 3:  DIABETES MELLITUS, TYPE II (ICD-250.00) Assessment: Unchanged  Her updated medication list for this problem includes:    Benazepril Hcl 40 Mg Tabs (Benazepril hcl) .Marland Kitchen... Take 1  tablet by mouth once a day    Benicar 40 Mg Tabs (Olmesartan medoxomil) .Marland Kitchen... Take 1 tablet by mouth once a day    Glipizide 10 Mg Tabs (Glipizide) .Marland Kitchen... Take 1 tablet by mouth two times a day    Lantus Solostar 100 Unit/ml Soln (Insulin glargine) .Marland KitchenMarland KitchenMarland KitchenMarland Kitchen 100 units daily    Novolin 70/30 70-30 % Susp (Insulin isophane & regular) .Marland KitchenMarland KitchenMarland KitchenMarland Kitchen 5 units twice daily   relion Patient advised to reduce carbs and sweets, commit  to regular physical activity, take meds as prescribed, test blood sugars as directed, and attempt to lose weight , to improve blood sugar control.  Orders: Nephrology Referral (Nephro)  Labs Reviewed: Creat: 1.97 (11/03/2009)    Reviewed HgBA1c results: 7.9 (11/03/2009)  7.8 (08/02/2009)  Problem # 4:  HYPERLIPIDEMIA (ICD-272.4) Assessment: Improved  The following medications were removed from the medication list:    Pravastatin Sodium 40 Mg Tabs (Pravastatin sodium) .Marland Kitchen... Take two tablets by mouth at bedtime Her updated medication list for this problem includes:    Pravastatin Sodium 20 Mg Tabs (Pravastatin sodium) .Marland Kitchen... Take 1 tab by mouth at bedtime  Labs Reviewed: SGOT: 19 (11/03/2009)   SGPT: 22 (11/03/2009)   HDL:24 (11/03/2009), 26 (04/29/2009)  LDL:14 (11/03/2009), 64 (04/29/2009)  Chol:105 (11/03/2009), 133 (04/29/2009)  Trig:336 (11/03/2009), 217 (04/29/2009)  Complete Medication List: 1)  Hydrocodone-acetaminophen 7.5-325 Mg Tabs (Hydrocodone-acetaminophen) .... One tab every 4 hrs as needed 2)  Benazepril Hcl 40 Mg Tabs (Benazepril hcl) .... Take 1 tablet by mouth once a day 3)  Klor-con M10 10 Meq Tbcr (Potassium chloride crys cr) .... Take four tablets by mouth twice daily 4)  Benicar 40 Mg Tabs (Olmesartan medoxomil) .... Take 1 tablet by mouth once a day 5)  Diltiazem Hcl Cr 180 Mg Cp24 (Diltiazem hcl) .... Take 1 tablet by mouth once a day 6)  Metoprolol Tartrate 100 Mg Tabs (Metoprolol tartrate) .... Take 1 tablet by mouth two times a day 7)  Metolazone 5 Mg  Tabs (Metolazone) .... Take 1 tablet by mouth once a day 8)  Glipizide 10 Mg Tabs (Glipizide) .... Take 1 tablet by mouth two times a day 9)  Allopurinol 300 Mg Tabs (Allopurinol) .... Take one tab by mouth once daily 10)  Lantus Solostar 100 Unit/ml Soln (Insulin glargine) .Marland Kitchen.. 100 units daily 11)  Cyclobenzaprine Hcl 10 Mg Tabs (Cyclobenzaprine hcl) .... One tab by mouth three times a day as needed 12)  Accu-chek Compact Strp (Glucose blood) .... Once daily testing 13)  Pravastatin Sodium 20 Mg Tabs (Pravastatin sodium) .... Take 1 tab by mouth at bedtime 14)  Furosemide 40 Mg Tabs (Furosemide) .... Take 1 tablet by mouth two times a day 15)  Novolin 70/30 70-30 % Susp (Insulin isophane & regular) .... 5 units twice daily   relion  Other Orders: Influenza Vaccine NON MCR [id number]) Pneumococcal Vaccine [id number]) Admin 1st Vaccine [id number])  Patient Instructions: 1)  F/u in 6 to 8 weeks 2)  test and record blood sugars 3 times daily, bring this record and meter to [facility]V. call sooner with quest/probs 3)  It is important that you exercise regularly at least 20 minutes 5 times a week. If you develop chest pain, have severe difficulty breathing, or feel very tired , stop exercising immediately and seek medical attention. 4)  You need to lose weight. Consider a lower calorie diet and regular exercise.  5)  reduced dose of prvastatin is 20mg  one at night, pls take 40mg  half at bedtime till done. 6)  You need to follow a low fat diet. 7)  stop naproxen 8)  you are being referred back to the kidney specialist, dr. Birdie Sons. 9)  The medication list was reviewed and reconciled..All changed/newly prescribed medications were explained. A complete medication list was provided to the patient/caregiver.  10)  you need to start additional insulin  relion mix 5, continuie the other diabetes meds as before. Prescriptions: NOVOLIN 70/30 70-30 % SUSP (INSULIN ISOPHANE & REGULAR) 5 units  twice daily   RELION  #300  units x 4   Entered and Authorized by:   Tula Nakayama MD   Signed by:   Tula Nakayama MD on 11/07/2009   Method used:   Print then Give to Patient   RxID:   [id number] FUROSEMIDE 40 MG TABS (FUROSEMIDE) Take 1 tablet by mouth two times a day  #60 x 5   Entered and Authorized by:   Tula Nakayama MD   Signed by:   Tula Nakayama MD on 11/07/2009   Method used:   Printed then faxed to ...       [facility]  [address] (retail)       [place], [place]  [address]       Ph: [phone]       Fax: [phone]   [id number]:[id number] PRAVASTATIN SODIUM 20 MG TABS (PRAVASTATIN SODIUM) Take 1 tab by mouth at bedtime  #30 x 3   Entered and Authorized by:   Tula Nakayama MD   Signed by:   Tula Nakayama MD on 11/07/2009   Method used:   Printed then faxed to ...       [facility]  [address] (retail)       [place],   [address]       Ph: [phone]       Fax: [phone]   RxIDDS:[id number]    Immunizations Administered:  Influenza Vaccine # 1:    Vaccine Type: Fluvax Non-MCR    Site: right deltoid    Mfr: novartis    Dose: 0.5 ml    Route: IM    Given by: Louie Casa, CMA    Exp. Date: 05/2010    Lot #: [id number] 5p    VIS given: 08/23/09 version given November 07, 2009.  Pneumonia Vaccine:    Vaccine Type: Pneumovax    Site: left deltoid    Mfr: Merck    Dose: 0.5 ml    Route: IM    Given by: Louie Casa, CMA    Exp. Date: 04/17/2011    Lot #: [id number]    VIS given: 01/03/09 version given November 07, 2009.     Orders Added: 1)  Est. Patient Level IV [id number] 2)  Influenza Vaccine NON MCR [00028] 3)  Pneumococcal Vaccine [90732] 4)  Admin 1st Vaccine [id number] 5)  Nephrology Referral [Nephro]

## 2010-02-28 NOTE — Progress Notes (Signed)
Summary: sick  Phone Note Call from Patient   Summary of Call: has a really bad cold and can't talk good and would like to get a rx [phone] Initial call taken by: Lenn Cal,  March 08, 2009 2:09 PM  Follow-up for Phone Call        no fever, no chills, no body aches cough with yellow sputum very hoarse, loosing voice  [facility] Follow-up by: Jimmey Ralph LPN,  February  8, 624THL 2:27 PM  Additional Follow-up for Phone Call Additional follow up Details #1::        ov in am or thursday Additional Follow-up by: Tula Nakayama MD,  March 08, 2009 4:48 PM    Additional Follow-up for Phone Call Additional follow up Details #2::    pt was called and left message for her to call office back. that she needed appt for tomorrow are thurs.  Follow-up by: Lenn Cal,  March 08, 2009 4:52 PM  Additional Follow-up for Phone Call Additional follow up Details #3:: Details for Additional Follow-up Action Taken: COMING IN TODAY AT 10:00 Additional Follow-up by: Dierdre Harness,  March 09, 2009 8:17 AM

## 2010-02-28 NOTE — Progress Notes (Signed)
Summary: [facility]  [facility]   Imported By: Dierdre Harness 09/06/2009 14:10:50  _____________________________________________________________________  External Attachment:    Type:   Image     Comment:   External Document

## 2010-02-28 NOTE — Progress Notes (Signed)
Summary: can't afford meds  Phone Note Call from Patient   Summary of Call: her meds id is going to be 246.00 and she can't afford that. [id number]   insulin Initial call taken by: Lenn Cal,  May 06, 2009 10:42 AM  Follow-up for Phone Call        advise a similar type is being sent in , pls wstamp and fax and write on d/c humalog 50/50,she needs to take it in the same dosage which i told her at the [facility]V which is more than on the prescription, pls remind her of this  and ensure she understands Follow-up by: Tula Nakayama MD,  May 06, 2009 1:26 PM  Additional Follow-up for Phone Call Additional follow up Details #1::        returned call, left message Additional Follow-up by: Baldomero Lamy LPN,  April  8, 624THL 1:29 PM    Additional Follow-up for Phone Call Additional follow up Details #2::    patient aware Follow-up by: Baldomero Lamy LPN,  April  8, 624THL 2:28 PM  New/Updated Medications: HUMULIN 70/30 70-30 % SUSP (INSULIN ISOPHANE & REGULAR) 30 units twice daily Prescriptions: HUMULIN 70/30 70-30 % SUSP (INSULIN ISOPHANE & REGULAR) 30 units twice daily  #1800 units x 3   Entered and Authorized by:   Tula Nakayama MD   Signed by:   Tula Nakayama MD on 05/06/2009   Method used:   Printed then faxed to ...       [facility]  [address] (retail)       [address] [place], [place]  [address]       Ph: [phone]       Fax: [phone]   RxID:   [id number]

## 2010-02-28 NOTE — Assessment & Plan Note (Signed)
Summary: FOLLOW UP   Vital Signs:  Patient profile:   60 year old female Menstrual status:  hysterectomy Height:      66 inches Weight:      248 pounds BMI:     40.17 O2 Sat:      97 % Pulse rate:   62 / minute Pulse rhythm:   regular Resp:     16 per minute BP sitting:   130 / 70  (left arm) Cuff size:   large  Vitals Entered By: Kate Sable LPN (April  6, 624THL [id number] PM)  Nutrition Counseling: Patient's BMI is greater than 25 and therefore counseled on weight management options. CC: has an area in the upper inner thigh that is purple, not tender or itchy. Has a mole on her right groin area that she wants cut off because its growing   Primary Care Provider:  Tula Nakayama MD  CC:  has an area in the upper inner thigh that is purple and not tender or itchy. Has a mole on her right groin area that she wants cut off because its growing.  History of Present Illness: Reports  that she has not been doing well. She states that despite her best attempts her blood sugars remain high and uncontrolled which both botheras and frustrates her. Denies recent fever or chills. Denies sinus pressure, nasal congestion , ear pain or sore throat. Denies chest congestion, or cough productive of sputum. Denies chest pain, palpitations, PND, orthopnea or leg swelling. Denies abdominal pain, nausea, vomitting, diarrhea or constipation. Denies change in bowel movements or bloody stool. Denies dysuria , frequency, incontinence or hesitancy. Denies  joint pain, swelling, or reduced mobility. Denies headaches, vertigo, seizures. Denies depression, anxiety or insomnia. Denies  rash, lesions, or itch.     Allergies: 1)  ! Pcn 2)  ! Flagyl 3)  ! Sulfa 4)  ! * Mobic  Past History:  Past Medical History:  HYPERLIPIDEMIA (ICD-272.4) OBESITY (ICD-278.00) DIABETES MELLITUS, TYPE II (ICD-250.00) HYPERTENSION (ICD-401.9) MRSA on ant abdomen 03/2009  Review of Systems General:  Complains of  fatigue; denies chills and fever. Eyes:  Denies blurring, double vision, eye pain, and red eye. ENT:  Denies hoarseness, nasal congestion, sinus pressure, and sore throat. CV:  Denies chest pain or discomfort, difficulty breathing while lying down, palpitations, and swelling of feet. Resp:  Denies cough, shortness of breath, sputum productive, and wheezing. GI:  Denies abdominal pain, constipation, diarrhea, nausea, and vomiting. GU:  Denies dysuria and urinary frequency. MS:  Complains of joint pain, low back pain, mid back pain, and stiffness; dens recent gout flares. Derm:  Complains of lesion(s); SKkin tag on right upper groin that she wants removed. Psych:  Complains of anxiety and depression; denies easily tearful, irritability, mental problems, suicidal thoughts/plans, thoughts of violence, and unusual visions or sounds; depressed over her diabetes management, feels elpless and hopeless at times. Endo:  Complains of excessive thirst and excessive urination; fasting blood sugars elevated between 18020 220 despite herbest efforts to control them. Heme:  Denies abnormal bruising and bleeding. Allergy:  Complains of seasonal allergies.  Physical Exam  General:  Well-developed,obese,in no acute distress; alert,appropriate and cooperative throughout examination. HEENT: No facial asymmetry,  EOMI, No sinus tenderness, TM's Clear, oropharynx  pink and moist.   Chest: decreased air entry , scattered crackles, no wheezes CVS: S1, S2, No murmurs, No S3.   Abd: Soft, Nontender.  MS: Adequate ROM spine, hips, shoulders and knees.  Ext: No edema.  CNS: CN 2-12 intact, power tone and sensation normal throughout.   Skin: Intact, no visible lesions or rashes.  Psych: Good eye contact, normal affect.  Memory intact, not anxious or depressed appearing.    Impression & Recommendations:  Problem # 1:  OBESITY (ICD-278.00) Assessment Deteriorated  Ht: 66 (05/04/2009)   Wt: 248 (05/04/2009)    BMI: 40.17 (05/04/2009)  Problem # 2:  HYPERTENSION (ICD-401.9) Assessment: Unchanged  Her updated medication list for this problem includes:    Benazepril Hcl 40 Mg Tabs (Benazepril hcl) .Marland Kitchen... Take 1 tablet by mouth once a day    Benicar 40 Mg Tabs (Olmesartan medoxomil) .Marland Kitchen... Take 1 tablet by mouth once a day    Diltiazem Hcl Cr 180 Mg Cp24 (Diltiazem hcl) .Marland Kitchen... Take 1 tablet by mouth once a day    Metoprolol Tartrate 100 Mg Tabs (Metoprolol tartrate) .Marland Kitchen... Take 1 tablet by mouth two times a day    Metolazone 5 Mg Tabs (Metolazone) .Marland Kitchen... Take 1 tablet by mouth once a day    Lasix 40 Mg Tabs (Furosemide) .Marland Kitchen... Take 1 tablet by mouth three times a day  BP today: 130/70 Prior BP: 130/62 (03/09/2009)  Labs Reviewed: K+: 4.6 (04/29/2009) Creat: : 1.48 (04/29/2009)   Chol: 133 (04/29/2009)   HDL: 26 (04/29/2009)   LDL: 64 (04/29/2009)   TG: 217 (04/29/2009)  Problem # 3:  DIABETES MELLITUS, TYPE II (ICD-250.00) Assessment: Deteriorated  Her updated medication list for this problem includes:    Benazepril Hcl 40 Mg Tabs (Benazepril hcl) .Marland Kitchen... Take 1 tablet by mouth once a day    Benicar 40 Mg Tabs (Olmesartan medoxomil) .Marland Kitchen... Take 1 tablet by mouth once a day    Glipizide 10 Mg Tabs (Glipizide) .Marland Kitchen... Take 1 tablet by mouth two times a day    Lantus Solostar 100 Unit/ml Soln (Insulin glargine) .Marland KitchenMarland KitchenMarland KitchenMarland Kitchen 100 units daily    Humulin 70/30 70-30 % Susp (Insulin isophane & regular) .Marland KitchenMarland KitchenMarland KitchenMarland Kitchen 30 units twice daily  Orders: T-Urine Microalbumin w/creat. ratio [phone])  Labs Reviewed: Creat: 1.48 (04/29/2009)    Reviewed HgBA1c results: 7.9 (05/02/2009)  7.6 (01/03/2009)  Problem # 4:  GOUT, UNSPECIFIED (ICD-274.9) Assessment: Improved  Her updated medication list for this problem includes:    Allopurinol 300 Mg Tabs (Allopurinol) .Marland Kitchen... Take one tab by mouth once daily  Complete Medication List: 1)  Hydrocodone-acetaminophen 7.5-325 Mg Tabs (Hydrocodone-acetaminophen) .... One tab  every 4 hrs as needed 2)  Benazepril Hcl 40 Mg Tabs (Benazepril hcl) .... Take 1 tablet by mouth once a day 3)  Klor-con M10 10 Meq Tbcr (Potassium chloride crys cr) .... Take four tablets by mouth twice daily 4)  Benicar 40 Mg Tabs (Olmesartan medoxomil) .... Take 1 tablet by mouth once a day 5)  Diltiazem Hcl Cr 180 Mg Cp24 (Diltiazem hcl) .... Take 1 tablet by mouth once a day 6)  Metoprolol Tartrate 100 Mg Tabs (Metoprolol tartrate) .... Take 1 tablet by mouth two times a day 7)  Metolazone 5 Mg Tabs (Metolazone) .... Take 1 tablet by mouth once a day 8)  Lasix 40 Mg Tabs (Furosemide) .... Take 1 tablet by mouth three times a day 9)  Pravastatin Sodium 40 Mg Tabs (Pravastatin sodium) .... Take two tablets by mouth at bedtime 10)  Glipizide 10 Mg Tabs (Glipizide) .... Take 1 tablet by mouth two times a day 11)  Allopurinol 300 Mg Tabs (Allopurinol) .... Take one tab by mouth once daily 12)  Naproxen 500 Mg Tabs (Naproxen) .Marland KitchenMarland KitchenMarland Kitchen  Take 1 tablet by mouth two times a day 13)  Prodigy Glucose Meter  .... Uad 14)  Lantus Solostar 100 Unit/ml Soln (Insulin glargine) .Marland Kitchen.. 100 units daily 15)  Cyclobenzaprine Hcl 10 Mg Tabs (Cyclobenzaprine hcl) .... One tab by mouth three times a day as needed 16)  Doxycycline Hyclate 100 Mg Caps (Doxycycline hyclate) .... Take 1 capsule by mouth two times a day[ 17)  Tessalon Perles 100 Mg Caps (Benzonatate) .... Take 1 capsule by mouth three times a day 18)  Accu-chek Compact Strp (Glucose blood) .... Once daily testing 19)  Humulin 70/30 70-30 % Susp (Insulin isophane & regular) .... 30 units twice daily  Patient Instructions: 1)  F/U in 6 weeks 2)  Start  new insulin and follow the written directions. 3)  Continue lantus and glipizide pls 4)  Call in 3 weeks if your numbers are still high Prescriptions: HUMALOG MIX 50/50 KWIKPEN 50-50 % SUSP (INSULIN LISPRO PROT & LISPRO) 30 units two times a day  #1800 x 3   Entered by:   Kate Sable LPN   Authorized by:    Tula Nakayama MD   Signed by:   Kate Sable LPN on D34-534   Method used:   Historical   [id number] BENICAR 40 MG  TABS (OLMESARTAN MEDOXOMIL) Take 1 tablet by mouth once a day  #112 x 0   Entered by:   Kate Sable LPN   Authorized by:   Tula Nakayama MD   Signed by:   Kate Sable LPN on [id number]   Method used:   Samples Given   RxID:   [id number]

## 2010-03-02 NOTE — Assessment & Plan Note (Signed)
Summary: office visit   Vital Signs:  Patient profile:   60 year old female Menstrual status:  hysterectomy Height:      66 inches Weight:      248.25 pounds BMI:     40.21 O2 Sat:      98 % on Room air Pulse rate:   75 / minute Pulse rhythm:   regular Resp:     16 per minute BP sitting:   120 / 60  (right arm)  Vitals Entered By: Baldomero Lamy LPN (December  5, 624THL 3:11 PM)  Nutrition Counseling: Patient's BMI is greater than 25 and therefore counseled on weight management options.  O2 Flow:  Room air CC: follow-up visit Is Patient Diabetic? Yes Did you bring your meter with you today? No Pain Assessment Patient in pain? no        Primary Care Anneth Brunell:  Tula Nakayama MD  CC:  follow-up visit.  History of Present Illness: Reports  that she has been doing fairly well. Denies recent fever or chills. Denies sinus pressure, nasal congestion , ear pain or sore throat. Denies chest congestion, or cough productive of sputum. Denies chest pain, palpitations, PND, orthopnea or leg swelling. Denies abdominal pain, nausea, vomitting, diarrhea or constipation. Denies change in bowel movements or bloody stool. Denies dysuria , frequency, incontinence or hesitancy. c/o   joint pain, with reduced mobility. Denies headaches, vertigo, seizures. Denies depression, anxiety or insomnia. Denies  rash, lesions, or itch.     Current Medications (verified): 1)  Hydrocodone-Acetaminophen 7.5-325 Mg Tabs (Hydrocodone-Acetaminophen) .... One Tab Every 4 Hrs As Needed 2)  Benazepril Hcl 40 Mg  Tabs (Benazepril Hcl) .... Take 1 Tablet By Mouth Once A Day 3)  Klor-Con M10 10 Meq  Tbcr (Potassium Chloride Crys Cr) .... Take Four Tablets By Mouth Twice Daily 4)  Benicar 40 Mg  Tabs (Olmesartan Medoxomil) .... Take 1 Tablet By Mouth Once A Day 5)  Diltiazem Hcl Cr 180 Mg  Cp24 (Diltiazem Hcl) .... Take 1 Tablet By Mouth Once A Day 6)  Metoprolol Tartrate 100 Mg  Tabs (Metoprolol Tartrate)  .... Take 1 Tablet By Mouth Two Times A Day 7)  Metolazone 5 Mg  Tabs (Metolazone) .... Take 1 Tablet By Mouth Once A Day 8)  Glipizide 10 Mg  Tabs (Glipizide) .... Take 1 Tablet By Mouth Two Times A Day 9)  Allopurinol 300 Mg Tabs (Allopurinol) .... Take One Tab By Mouth Once Daily 10)  Lantus Solostar 100 Unit/ml Soln (Insulin Glargine) .Marland Kitchen.. 100 Units Daily 11)  Cyclobenzaprine Hcl 10 Mg Tabs (Cyclobenzaprine Hcl) .... One Tab By Mouth Three Times A Day As Needed 12)  Accu-Chek Compact  Strp (Glucose Blood) .... Once Daily Testing 13)  Pravastatin Sodium 20 Mg Tabs (Pravastatin Sodium) .... Take 1 Tab By Mouth At Bedtime 14)  Furosemide 40 Mg Tabs (Furosemide) .... Take 1 Tablet By Mouth Two Times A Day 15)  Novolin 70/30 70-30 % Susp (Insulin Isophane & Regular) .... 5 Units Twice Daily   Relion  Allergies (verified): 1)  ! Pcn 2)  ! Flagyl 3)  ! Sulfa 4)  ! * Mobic  Review of Systems      See HPI General:  Complains of fatigue and sleep disorder. Eyes:  Denies blurring, discharge, eye pain, and red eye. MS:  Complains of joint pain, low back pain, mid back pain, and stiffness. Endo:  three times da ily fasting averages  120 , 2 hrs  after lunch  140 and before supper around 170. Heme:  Denies abnormal bruising, bleeding, and pallor. Allergy:  Denies hives or rash, itching eyes, and seasonal allergies.  Physical Exam  General:  Well-developed,obese,in no acute distress; alert,appropriate and cooperative throughout examination HEENT: No facial asymmetry,  EOMI, No sinus tenderness, TM's Clear, oropharynx  pink and moist.   Chest: Clear to auscultation bilaterally.  CVS: S1, S2, No murmurs, No S3.   Abd: Soft, Nontender.  MS: decreased  ROM spine, hips, shoulders and knees.  Ext: No edema.   CNS: CN 2-12 intact, power tone and sensation normal throughout.   Skin: Intact, no visible lesions or rashes.  Psych: Good eye contact, normal affect.  Memory intact, not anxious or  depressed appearing.    Impression & Recommendations:  Problem # 1:  HYPERTENSION (ICD-401.9) Assessment Unchanged  Her updated medication list for this problem includes:    Benazepril Hcl 40 Mg Tabs (Benazepril hcl) .Marland Kitchen... Take 1 tablet by mouth once a day    Benicar 40 Mg Tabs (Olmesartan medoxomil) .Marland Kitchen... Take 1 tablet by mouth once a day    Diltiazem Hcl Cr 180 Mg Cp24 (Diltiazem hcl) .Marland Kitchen... Take 1 tablet by mouth once a day    Metoprolol Tartrate 100 Mg Tabs (Metoprolol tartrate) .Marland Kitchen... Take 1 tablet by mouth two times a day    Metolazone 5 Mg Tabs (Metolazone) .Marland Kitchen... Take 1 tablet by mouth once a day    Furosemide 40 Mg Tabs (Furosemide) .Marland Kitchen... Take 1 tablet by mouth two times a day  Prior BP: 120/60 (11/07/2009)  Labs Reviewed: K+: 4.5 (11/03/2009) Creat: : 1.97 (11/03/2009)   Chol: 105 (11/03/2009)   HDL: 24 (11/03/2009)   LDL: 14 (11/03/2009)   TG: 336 (11/03/2009)  Problem # 2:  CHRONIC KIDNEY DISEASE STAGE III (MODERATE) (ICD-585.3) Assessment: Comment Only currently followed by nephrology  Problem # 3:  OBESITY (ICD-278.00) Assessment: Unchanged  Ht: 66 (01/02/2010)   Wt: 248.25 (01/02/2010)   BMI: 40.21 (01/02/2010) therapeutic lifestyle change discussed and encouraged  Problem # 4:  DIABETES MELLITUS, TYPE II (ICD-250.00) Assessment: Comment Only  Her updated medication list for this problem includes:    Benazepril Hcl 40 Mg Tabs (Benazepril hcl) .Marland Kitchen... Take 1 tablet by mouth once a day    Benicar 40 Mg Tabs (Olmesartan medoxomil) .Marland Kitchen... Take 1 tablet by mouth once a day    Glipizide 10 Mg Tabs (Glipizide) .Marland Kitchen... Take 1 tablet by mouth two times a day    Lantus Solostar 100 Unit/ml Soln (Insulin glargine) .Marland KitchenMarland KitchenMarland KitchenMarland Kitchen 100 units daily    Novolin 70/30 70-30 % Susp (Insulin isophane & regular) .Marland KitchenMarland KitchenMarland KitchenMarland Kitchen 5 units twice daily   relion based on diary which is brought in , pt has improved control, she is encouraged to remain diligent in this. She declines nutrtional education at this  time Orders: T- Hemoglobin A1C [id number])  Labs Reviewed: Creat: 1.97 (11/03/2009)    Reviewed HgBA1c results: 7.9 (11/03/2009)  7.8 (08/02/2009)  Problem # 5:  HYPERLIPIDEMIA (ICD-272.4) Assessment: Comment Only  Her updated medication list for this problem includes:    Pravastatin Sodium 20 Mg Tabs (Pravastatin sodium) .Marland Kitchen... Take 1 tab by mouth at bedtime Low fat dietdiscussed and encouraged  Labs Reviewed: SGOT: 19 (11/03/2009)   SGPT: 22 (11/03/2009)   HDL:24 (11/03/2009), 26 (04/29/2009)  LDL:14 (11/03/2009), 64 (04/29/2009)  Chol:105 (11/03/2009), 133 (04/29/2009)  Trig:336 (11/03/2009), 217 (04/29/2009)  Complete Medication List: 1)  Hydrocodone-acetaminophen 7.5-325 Mg Tabs (Hydrocodone-acetaminophen) .... One tab every 4 hrs as needed  2)  Benazepril Hcl 40 Mg Tabs (Benazepril hcl) .... Take 1 tablet by mouth once a day 3)  Klor-con M10 10 Meq Tbcr (Potassium chloride crys cr) .... Take four tablets by mouth twice daily 4)  Benicar 40 Mg Tabs (Olmesartan medoxomil) .... Take 1 tablet by mouth once a day 5)  Diltiazem Hcl Cr 180 Mg Cp24 (Diltiazem hcl) .... Take 1 tablet by mouth once a day 6)  Metoprolol Tartrate 100 Mg Tabs (Metoprolol tartrate) .... Take 1 tablet by mouth two times a day 7)  Metolazone 5 Mg Tabs (Metolazone) .... Take 1 tablet by mouth once a day 8)  Glipizide 10 Mg Tabs (Glipizide) .... Take 1 tablet by mouth two times a day 9)  Allopurinol 300 Mg Tabs (Allopurinol) .... Take one tab by mouth once daily 10)  Lantus Solostar 100 Unit/ml Soln (Insulin glargine) .Marland Kitchen.. 100 units daily 11)  Cyclobenzaprine Hcl 10 Mg Tabs (Cyclobenzaprine hcl) .... One tab by mouth three times a day as needed 12)  Accu-chek Compact Strp (Glucose blood) .... Once daily testing 13)  Pravastatin Sodium 20 Mg Tabs (Pravastatin sodium) .... Take 1 tab by mouth at bedtime 14)  Furosemide 40 Mg Tabs (Furosemide) .... Take 1 tablet by mouth two times a day 15)  Novolin 70/30 70-30 %  Susp (Insulin isophane & regular) .... 5 units twice daily   relion 16)  Benazepril Hcl 40 Mg Tabs (Benazepril hcl) .... Take 1 tablet by mouth once a day  Other Orders: Medicare Electronic Prescription [id number])  Patient Instructions: 1)  F/u end January. 2)  It is important that you exercise regularly at least 20 minutes 5 times a week. If you develop chest pain, have severe difficulty breathing, or feel very tired , stop exercising immediately and seek medical attention. 3)  You need to lose weight. Consider a lower calorie diet and regular exercise.  4)  HbgA1C prior to visit, ICD-9:  end January, non fasting Prescriptions: BENAZEPRIL HCL 40 MG TABS (BENAZEPRIL HCL) Take 1 tablet by mouth once a day  #30 x 4   Entered and Authorized by:   Tula Nakayama MD   Signed by:   Tula Nakayama MD on 01/08/2010   Method used:   Electronically to        [facility] (retail)       [address]       [place], [place]  [address]       Ph: [phone]       Fax: [phone]   RxID:   [id number]    Orders Added: 1)  Est. Patient Level IV [id number] 2)  Medicare Electronic Prescription [id number] 3)  T- Hemoglobin A1C [[id number]]   lantus samples given lot [id number] exp 01/14

## 2010-03-03 ENCOUNTER — Encounter: Payer: Self-pay | Admitting: Family Medicine

## 2010-03-06 LAB — CONVERTED CEMR LAB
CO2: 27 meq/L (ref 19–32)
Calcium: 9.4 mg/dL (ref 8.4–10.5)
Chloride: 104 meq/L (ref 96–112)
Hgb A1c MFr Bld: 8.8 % — ABNORMAL HIGH (ref <5.7)
Sodium: 143 meq/L (ref 135–145)

## 2010-03-08 ENCOUNTER — Encounter: Payer: Self-pay | Admitting: Family Medicine

## 2010-03-08 NOTE — Letter (Signed)
Summary: Letter  Letter   Imported By: Dierdre Harness 03/03/2010 09:20:35  _____________________________________________________________________  External Attachment:    Type:   Image     Comment:   External Document

## 2010-03-08 NOTE — Letter (Signed)
Summary: dose increase  dose increase   Imported By: Dierdre Harness 02/28/2010 13:11:57  _____________________________________________________________________  External Attachment:    Type:   Image     Comment:   External Document

## 2010-03-08 NOTE — Assessment & Plan Note (Signed)
Summary: follow-up   Vital Signs:  Patient profile:   60 year old female Menstrual status:  hysterectomy Height:      66 inches Weight:      250 pounds BMI:     40.50 O2 Sat:      94 % Pulse rate:   76 / minute Pulse rhythm:   regular Resp:     16 per minute BP sitting:   120 / 70  (left arm)  Vitals Entered By: Kate Sable LPN (January 30, [id number] 3:37 PM)  Nutrition Counseling: Patient's BMI is greater than 25 and therefore counseled on weight management options. CC: Follow up chronic problems, sugars have been elevated    Primary Care Provider:  Tula Nakayama MD  CC:  Follow up chronic problems and sugars have been elevated .  History of Present Illness: Reports  that tshe is doing fairly well, though her blood sugar continues to be a challenge Denies recent fever or chills. Denies sinus pressure, nasal congestion , ear pain or sore throat. Denies chest congestion, or cough productive of sputum. Denies chest pain, palpitations, PND, orthopnea or leg swelling. Denies abdominal pain, nausea, vomitting, diarrhea or constipation. Denies change in bowel movements or bloody stool. Denies dysuria , frequency, incontinence or hesitancy. Denies  joint pain, swelling, or reduced mobility. Denies headaches, vertigo, seizures. Denies depression, anxiety or insomnia. c/o worsening rash o both palms, and the back of the forearms by the elbows despite repeated evaluations by dermatology, wants 2nd opinion understandably.  1   Current Medications (verified): 1)  Hydrocodone-Acetaminophen 7.5-325 Mg Tabs (Hydrocodone-Acetaminophen) .... One Tab Every 4 Hrs As Needed 2)  Klor-Con M10 10 Meq  Tbcr (Potassium Chloride Crys Cr) .... Take Four Tablets By Mouth Twice Daily 3)  Benicar 40 Mg  Tabs (Olmesartan Medoxomil) .... Take 1 Tablet By Mouth Once A Day 4)  Diltiazem Hcl Cr 180 Mg  Cp24 (Diltiazem Hcl) .... Take 1 Tablet By Mouth Once A Day 5)  Metoprolol Tartrate 100 Mg  Tabs  (Metoprolol Tartrate) .... Take 1 Tablet By Mouth Two Times A Day 6)  Metolazone 5 Mg  Tabs (Metolazone) .... Take 1 Tablet By Mouth Once A Day 7)  Glipizide 10 Mg  Tabs (Glipizide) .... Take 1 Tablet By Mouth Two Times A Day 8)  Allopurinol 300 Mg Tabs (Allopurinol) .... Take One Tab By Mouth Once Daily 9)  Lantus Solostar 100 Unit/ml Soln (Insulin Glargine) .Marland Kitchen.. 100 Units Daily 10)  Cyclobenzaprine Hcl 10 Mg Tabs (Cyclobenzaprine Hcl) .... One Tab By Mouth Three Times A Day As Needed 11)  Accu-Chek Compact  Strp (Glucose Blood) .... Once Daily Testing 12)  Pravastatin Sodium 20 Mg Tabs (Pravastatin Sodium) .... Take 1 Tab By Mouth At Bedtime 13)  Furosemide 40 Mg Tabs (Furosemide) .... Take 1 Tablet By Mouth Two Times A Day 14)  Novolin 70/30 70-30 % Susp (Insulin Isophane & Regular) .... 5 Units Twice Daily   Relion 15)  Benazepril Hcl 40 Mg Tabs (Benazepril Hcl) .... Take 1 Tablet By Mouth Once A Day 16)  Aspir-Low 81 Mg Tbec (Aspirin) .... Take 1 Tablet By Mouth Once A Day  Allergies (verified): 1)  ! Pcn 2)  ! Flagyl 3)  ! Sulfa 4)  ! * Mobic  Review of Systems      See HPI General:  Complains of fatigue. Eyes:  Denies discharge and red eye. MS:  Complains of joint pain; no recent gout flares. Derm:  Complains of itching,  lesion(s), and rash; hyperpigmented lesions getting larger on palms, puritic lesions on back of both forearms, also hard painful lesions in anal fissure x 1 month. Endo:  Denies excessive thirst and excessive urination; blood sugars tested on avg twice daily, and average around 200 to 300. Heme:  Denies abnormal bruising and bleeding. Allergy:  Complains of seasonal allergies.  Physical Exam  General:  Well-developed,obese,in no acute distress; alert,appropriate and cooperative throughout examination HEENT: No facial asymmetry,  EOMI, No sinus tenderness, TM's Clear, oropharynx  pink and moist.   Chest: Clear to auscultation bilaterally.  CVS: S1, S2, No  murmurs, No S3.   Abd: Soft, Nontender.  MS: decreased  ROM spine, hips, shoulders and knees.  Ext: No edema.   CNS: CN 2-12 intact, power tone and sensation normal throughout.   Skin: Intact, hyperpigmented irregullar lesions on palms, also erythematous and hypopigmented lesions on posterior forearms Psych: Good eye contact, normal affect.  Memory intact, not anxious or depressed appearing.   Diabetes Management Exam:    Foot Exam (with socks and/or shoes not present):       Sensory-Monofilament:          Left foot: diminished          Right foot: diminished       Inspection:          Left foot: normal          Right foot: normal       Nails:          Left foot: fungal infection          Right foot: fungal infection   Impression & Recommendations:  Problem # 1:  SKIN LESION (ICD-709.9) Assessment Deteriorated  Future Orders: Dermatology Referral (Derma) ... 03/03/2010  Problem # 2:  OBESITY (ICD-278.00) Assessment: Unchanged  Ht: 66 (02/27/2010)   Wt: 250 (02/27/2010)   BMI: 40.50 (02/27/2010) therapeutic lifestyle change discussed and encouraged  Problem # 3:  DIABETES MELLITUS, TYPE II (ICD-250.00) Assessment: Comment Only  The following medications were removed from the medication list:    Benazepril Hcl 40 Mg Tabs (Benazepril hcl) .Marland Kitchen... Take 1 tablet by mouth once a day    Novolin 70/30 70-30 % Susp (Insulin isophane & regular) .Marland KitchenMarland KitchenMarland KitchenMarland Kitchen 5 units twice daily   relion Her updated medication list for this problem includes:    Benicar 40 Mg Tabs (Olmesartan medoxomil) .Marland Kitchen... Take 1 tablet by mouth once a day    Glipizide 10 Mg Tabs (Glipizide) .Marland Kitchen... Take 1 tablet by mouth two times a day    Lantus Solostar 100 Unit/ml Soln (Insulin glargine) .Marland KitchenMarland KitchenMarland KitchenMarland Kitchen 100 units daily    Benazepril Hcl 40 Mg Tabs (Benazepril hcl) .Marland Kitchen... Take 1 tablet by mouth once a day    Aspir-low 81 Mg Tbec (Aspirin) .Marland Kitchen... Take 1 tablet by mouth once a day    Novolin 70/30 70-30 % Susp (Insulin isophane &  regular) .Marland KitchenMarland KitchenMarland KitchenMarland Kitchen 10 units twice daily, dose inc effective 02/27/2010  Orders: Medicare Electronic Prescription [phone]) T- Hemoglobin A1C [id number]) T- Hemoglobin A1C [id number]) Patient advised to reduce carbs and sweets, commit to regular physical activity, take meds as prescribed, test blood sugars as directed, and attempt to lose weight , to improve blood sugar control.  Labs Reviewed: Creat: 1.97 (11/03/2009)    Reviewed HgBA1c results: 7.9 (11/03/2009)  7.8 (08/02/2009)  Problem # 4:  HYPERTENSION (ICD-401.9) Assessment: Unchanged  The following medications were removed from the medication list:    Benazepril Hcl 40 Mg Tabs (  Benazepril hcl) .Marland Kitchen... Take 1 tablet by mouth once a day Her updated medication list for this problem includes:    Benicar 40 Mg Tabs (Olmesartan medoxomil) .Marland Kitchen... Take 1 tablet by mouth once a day    Diltiazem Hcl Cr 180 Mg Cp24 (Diltiazem hcl) .Marland Kitchen... Take 1 tablet by mouth once a day    Metoprolol Tartrate 100 Mg Tabs (Metoprolol tartrate) .Marland Kitchen... Take 1 tablet by mouth two times a day    Metolazone 5 Mg Tabs (Metolazone) .Marland Kitchen... Take 1 tablet by mouth once a day    Furosemide 40 Mg Tabs (Furosemide) .Marland Kitchen... Take 1 tablet by mouth two times a day    Benazepril Hcl 40 Mg Tabs (Benazepril hcl) .Marland Kitchen... Take 1 tablet by mouth once a day  Orders: T-Basic Metabolic Panel ([id number]) T-CMP with estimated GFR ([id number])  BP today: 120/70 Prior BP: 120/60 (01/02/2010)  Labs Reviewed: K+: 4.5 (11/03/2009) Creat: : 1.97 (11/03/2009)   Chol: 105 (11/03/2009)   HDL: 24 (11/03/2009)   LDL: 14 (11/03/2009)   TG: 336 (11/03/2009)  Complete Medication List: 1)  Hydrocodone-acetaminophen 7.5-325 Mg Tabs (Hydrocodone-acetaminophen) .... One tab every 4 hrs as needed 2)  Klor-con M10 10 Meq Tbcr (Potassium chloride crys cr) .... Take four tablets by mouth twice daily 3)  Benicar 40 Mg Tabs (Olmesartan medoxomil) .... Take 1 tablet by mouth once a day 4)  Diltiazem Hcl Cr  180 Mg Cp24 (Diltiazem hcl) .... Take 1 tablet by mouth once a day 5)  Metoprolol Tartrate 100 Mg Tabs (Metoprolol tartrate) .... Take 1 tablet by mouth two times a day 6)  Metolazone 5 Mg Tabs (Metolazone) .... Take 1 tablet by mouth once a day 7)  Glipizide 10 Mg Tabs (Glipizide) .... Take 1 tablet by mouth two times a day 8)  Allopurinol 300 Mg Tabs (Allopurinol) .... Take one tab by mouth once daily 9)  Lantus Solostar 100 Unit/ml Soln (Insulin glargine) .Marland Kitchen.. 100 units daily 10)  Cyclobenzaprine Hcl 10 Mg Tabs (Cyclobenzaprine hcl) .... One tab by mouth three times a day as needed 11)  Accu-chek Compact Strp (Glucose blood) .... Once daily testing 12)  Pravastatin Sodium 20 Mg Tabs (Pravastatin sodium) .... Take 1 tab by mouth at bedtime 13)  Furosemide 40 Mg Tabs (Furosemide) .... Take 1 tablet by mouth two times a day 14)  Benazepril Hcl 40 Mg Tabs (Benazepril hcl) .... Take 1 tablet by mouth once a day 15)  Aspir-low 81 Mg Tbec (Aspirin) .... Take 1 tablet by mouth once a day 16)  Doxycycline Hyclate 100 Mg Caps (Doxycycline hyclate) .... Take 1 capsule by mouth two times a day 17)  Novolin 70/30 70-30 % Susp (Insulin isophane & regular) .Marland Kitchen.. 10 units twice daily, dose inc effective 02/27/2010  Other Orders: T-Uric Acid (Blood) [phone] [phone]) T-Hepatic Function [id number]) T-Lipid Profile [id number])  Patient Instructions: 1)  Please schedule a follow-up appointment in 3 months. 2)  It is important that you exercise regularly at least 30 minutes 5 times a week. If you develop chest pain, have severe difficulty breathing, or feel very tired , stop exercising immediately and seek medical attention. 3)  You need to lose weight. Consider a lower calorie diet and regular exercise.  4)  [facility], in the anal fissure  and the forearms 5)  BMP prior to visit, ICD-9: 6)  HbgA1C prior to visit, ICD-9:   today 7)  Uric acid  8)  Fasting  9)  BMP prior to visit, ICD-9:  and EGFR 10)  Hepatic Panel prior to visit, ICD-9: 11)  Lipid Panel prior to visit, ICD-9: 12)  HbgA1C prior to visit, ICD-9:   fasting in 3 months Prescriptions: FUROSEMIDE 40 MG TABS (FUROSEMIDE) Take 1 tablet by mouth two times a day  #60 x 3   Entered by:   Baldomero Lamy LPN   Authorized by:   Tula Nakayama MD   Signed by:   Baldomero Lamy LPN on [id number]   Method used:   Electronically to        [facility] (retail)       [address] [place] [place]       [place], [place]  [address]       Ph: [phone]       Fax: [phone]   [id number] METOLAZONE 5 MG  TABS (METOLAZONE) Take 1 tablet by mouth once a day  #30 Each x 3   Entered by:   Baldomero Lamy LPN   Authorized by:   Tula Nakayama MD   Signed by:   Baldomero Lamy LPN on [id number]   Method used:   Electronically to        [facility] (retail)       [address]       [place], [place]  [address]       Ph: [phone]       Fax: [phone]   [id number] METOPROLOL TARTRATE 100 MG  TABS (METOPROLOL TARTRATE) Take 1 tablet by mouth two times a day  #60 Each x 3   Entered by:   Baldomero Lamy LPN   Authorized by:   Tula Nakayama MD   Signed by:   Baldomero Lamy LPN on [id number]   Method used:   Electronically to        [facility] (retail)       [address]       [place], [place]  [address]       Ph: [phone]       Fax: [phone]   [id number] DILTIAZEM HCL CR 180 MG  CP24 (DILTIAZEM HCL) Take 1 tablet by mouth once a day  #30 Each x 3   Entered by:   Baldomero Lamy LPN   Authorized by:   Tula Nakayama MD   Signed by:   Baldomero Lamy LPN on [id number]   Method used:   Electronically to        [facility] (retail)       [address]       [place],   [address]       Ph: [phone]       Fax:  [phone]   RxID:   [id number] KLOR-CON M10 10 MEQ  TBCR (POTASSIUM CHLORIDE CRYS CR) Take four tablets by mouth twice daily  #240 Each x 3   Entered by:   Baldomero Lamy LPN   Authorized by:   Tula Nakayama MD   Signed by:   Baldomero Lamy LPN on [id number]   Method used:   Electronically to        [facility] (retail)  [place], [place]  [address]       Ph: [phone]       Fax: [phone]   RxID:   [id number] NOVOLIN 70/30 70-30 % SUSP (INSULIN ISOPHANE & REGULAR) 10 units twice daily, dose inc effective 02/27/2010  #600units x 2   Entered and Authorized by:   Tula Nakayama MD   Signed by:   Tula Nakayama MD on 02/27/2010   Method used:   Printed then faxed to ...       [facility]  [address] [facility] (retail)       [place], [place]  [address]       Ph: [phone]       Fax: [phone]   RxID:   [id number] DOXYCYCLINE HYCLATE 100 MG CAPS (DOXYCYCLINE HYCLATE) Take 1 capsule by mouth two times a day  #20 x 0   Entered and Authorized by:   Tula Nakayama MD   Signed by:   Tula Nakayama MD on 02/27/2010   Method used:   Electronically to        [facility] (retail)       [address]       [place], [place]  [address]       Ph: [phone]       Fax: [phone]   RxID:   [id number]    Orders Added: 1)  Est. Patient Level IV [id number] 2)  Medicare Electronic Prescription [id number])  T-Basic Metabolic Panel 0000000 4)  T- Hemoglobin A1C [83036-23375] 5)  T-Uric Acid (Blood) [84550-23180] 6)  T-CMP with estimated GFR [[address]] 7)  T-Hepatic Function [80076-22960] 8)  T-Lipid Profile [80061-22930] 9)  T- Hemoglobin A1C [83036-23375] 10)  Dermatology Referral [Derma]

## 2010-03-16 NOTE — Letter (Signed)
Summary: dermatology  dermatology   Imported By: Dierdre Harness 03/10/2010 10:45:57  _____________________________________________________________________  External Attachment:    Type:   Image     Comment:   External Document

## 2010-04-28 ENCOUNTER — Other Ambulatory Visit: Payer: Self-pay | Admitting: Family Medicine

## 2010-05-19 ENCOUNTER — Other Ambulatory Visit: Payer: Self-pay | Admitting: Family Medicine

## 2010-05-19 LAB — HEMOGLOBIN A1C
Hgb A1c MFr Bld: 9.3 % — ABNORMAL HIGH (ref <5.7)
Mean Plasma Glucose: 220 mg/dL — ABNORMAL HIGH (ref <117)

## 2010-05-19 LAB — LIPID PANEL
HDL: 26 mg/dL — ABNORMAL LOW (ref >39)
LDL Cholesterol: 32 mg/dL (ref 0–99)
Total CHOL/HDL Ratio: 4.6 Ratio
VLDL: 61 mg/dL — ABNORMAL HIGH (ref 0–40)

## 2010-05-20 LAB — COMPLETE METABOLIC PANEL WITH GFR
ALT: 28 U/L (ref 0–35)
AST: 18 U/L (ref 0–37)
Albumin: 3.8 g/dL (ref 3.5–5.2)
Alkaline Phosphatase: 91 U/L (ref 39–117)
GFR, Est Non African American: 34 mL/min — ABNORMAL LOW (ref >60)
Glucose, Bld: 193 mg/dL — ABNORMAL HIGH (ref 70–99)
Potassium: 4.8 mEq/L (ref 3.5–5.3)
Sodium: 141 mEq/L (ref 135–145)
Total Bilirubin: 0.3 mg/dL (ref 0.3–1.2)
Total Protein: 6.5 g/dL (ref 6.0–8.3)

## 2010-05-24 ENCOUNTER — Encounter: Payer: Self-pay | Admitting: Family Medicine

## 2010-05-25 ENCOUNTER — Encounter: Payer: Self-pay | Admitting: Family Medicine

## 2010-05-29 ENCOUNTER — Ambulatory Visit (HOSPITAL_COMMUNITY)
Admission: RE | Admit: 2010-05-29 | Discharge: 2010-05-29 | Disposition: A | Discharge status: Home / Self Care | Payer: Medicare PPO | Source: Ambulatory Visit | Attending: Family Medicine | Admitting: Family Medicine

## 2010-05-29 ENCOUNTER — Ambulatory Visit (INDEPENDENT_AMBULATORY_CARE_PROVIDER_SITE_OTHER): Payer: Medicare PPO | Admitting: Family Medicine

## 2010-05-29 ENCOUNTER — Inpatient Hospital Stay (HOSPITAL_COMMUNITY)
Admission: EM | Admit: 2010-05-29 | Discharge: 2010-06-01 | DRG: 343 | Disposition: A | Discharge status: Home / Self Care | Payer: Medicare PPO | Attending: General Surgery | Admitting: General Surgery

## 2010-05-29 ENCOUNTER — Other Ambulatory Visit: Payer: Self-pay | Admitting: General Surgery

## 2010-05-29 ENCOUNTER — Encounter: Payer: Self-pay | Admitting: Family Medicine

## 2010-05-29 ENCOUNTER — Emergency Department (HOSPITAL_COMMUNITY): Payer: Medicare PPO

## 2010-05-29 VITALS — BP 100/58 | HR 81 | Temp 100.2°F | Resp 16 | Wt 243.0 lb

## 2010-05-29 DIAGNOSIS — K3580 Unspecified acute appendicitis: Secondary | ICD-10-CM | POA: Insufficient documentation

## 2010-05-29 DIAGNOSIS — R1031 Right lower quadrant pain: Secondary | ICD-10-CM

## 2010-05-29 DIAGNOSIS — E119 Type 2 diabetes mellitus without complications: Secondary | ICD-10-CM

## 2010-05-29 DIAGNOSIS — I9589 Other hypotension: Secondary | ICD-10-CM | POA: Diagnosis not present

## 2010-05-29 DIAGNOSIS — E785 Hyperlipidemia, unspecified: Secondary | ICD-10-CM

## 2010-05-29 DIAGNOSIS — I10 Essential (primary) hypertension: Secondary | ICD-10-CM

## 2010-05-29 DIAGNOSIS — N289 Disorder of kidney and ureter, unspecified: Secondary | ICD-10-CM | POA: Diagnosis present

## 2010-05-29 DIAGNOSIS — E860 Dehydration: Secondary | ICD-10-CM | POA: Diagnosis present

## 2010-05-29 DIAGNOSIS — E7800 Pure hypercholesterolemia, unspecified: Secondary | ICD-10-CM | POA: Diagnosis present

## 2010-05-29 LAB — CBC
HCT: 38.3 % (ref 36.0–46.0)
Hemoglobin: 12.2 g/dL (ref 12.0–15.0)
MCH: 29.3 pg (ref 26.0–34.0)
MCHC: 31.9 g/dL (ref 30.0–36.0)
RDW: 15.7 % — ABNORMAL HIGH (ref 11.5–15.5)

## 2010-05-29 LAB — DIFFERENTIAL
Eosinophils Relative: 1 % (ref 0–5)
Lymphs Abs: 2.6 10*3/uL (ref 0.7–4.0)
Monocytes Absolute: 1.9 10*3/uL — ABNORMAL HIGH (ref 0.1–1.0)
Monocytes Relative: 11 % (ref 3–12)
Neutro Abs: 12.7 10*3/uL — ABNORMAL HIGH (ref 1.7–7.7)

## 2010-05-29 LAB — BASIC METABOLIC PANEL
BUN: 33 mg/dL — ABNORMAL HIGH (ref 6–23)
Chloride: 97 mEq/L (ref 96–112)
GFR calc non Af Amer: 24 mL/min — ABNORMAL LOW (ref >60)
Glucose, Bld: 131 mg/dL — ABNORMAL HIGH (ref 70–99)
Potassium: 4.3 mEq/L (ref 3.5–5.1)
Sodium: 136 mEq/L (ref 135–145)

## 2010-05-29 LAB — GLUCOSE, POCT (MANUAL RESULT ENTRY): POC Glucose: 233

## 2010-05-29 MED ORDER — PRAVASTATIN SODIUM 20 MG PO TABS: 20.0000 mg | ORAL_TABLET | Freq: Every day | ORAL | Status: DC

## 2010-05-29 MED ORDER — BENAZEPRIL HCL 40 MG PO TABS: 40.0000 mg | ORAL_TABLET | Freq: Every day | ORAL | Status: DC

## 2010-05-29 MED ORDER — DILTIAZEM HCL ER 180 MG PO CP24: 180.0000 mg | ORAL_CAPSULE | Freq: Every day | ORAL | Status: DC

## 2010-05-29 MED ORDER — FUROSEMIDE 40 MG PO TABS: 40.0000 mg | ORAL_TABLET | Freq: Two times a day (BID) | ORAL | Status: DC

## 2010-05-29 NOTE — Assessment & Plan Note (Signed)
Controlled, no change in medication  

## 2010-05-29 NOTE — Assessment & Plan Note (Signed)
Medication compliance addressed. Commitment to regular exercise and healthy  food choices, with portion control discussed. DASH diet and low fat diet discussed and literature offered. Changes in medication made at this visit. Referral to endo

## 2010-05-29 NOTE — Patient Instructions (Signed)
You are being referred for scans of your abdomen to further evaluate the pain you are having.Pls go over as soon as you leave here.  You are being referred to Dr. Dorris Fetch , a diabetes specialist here in [place] since your recent labs show worsening control  F/U in 8 weeks  I hope you feel better soon

## 2010-05-29 NOTE — Progress Notes (Signed)
  Subjective:    Patient ID: Krista Strickland, female    DOB: 04-Jan-1951, 60 y.o.   MRN: [id number]  HPI Sick x 3 days, burning in RLQ  And no apetite, , intermittent chills, no fever to her knowledge, no vomit, but nauseated, no change in bowel movements. HYPERTENSION Disease Monitoring Blood pressure range-unknown Chest pain- no      Dyspnea- no Medications Compliance- good Lightheadedness- no   Edema- no   DIABETES Disease Monitoring Blood Sugar ranges-often over 150 fasting Polyuria- no New Visual problems- no Medications Compliance- inadequate dietary coontrol Hypoglycemic symptoms- no   HYPERLIPIDEMIA Disease Monitoring See symptoms for Hypertension Medications Compliance- good RUQ pain- no  Muscle aches- no    Review of Systems    Pt in with acute abdominal symptoms as above. She alos recently hd labwork which is reviewed at this time also  Denies sinus pressure, nasal congestion, ear pain or sore throat. Denies chest congestion, productive cough or wheezing. Denies chest pains, palpitations, paroxysmal nocturnal dyspnea, orthopnea and leg swelling Denies joint pain, swelling and limitation in mobility. Denies headaches, seizure, numbness, or tingling. Denies depression, anxiety or insomnia. Denies skin break down or rash.      Objective:   Physical Exam Patient alert and oriented and in pain  HEENT: No facial asymmetry, EOMI, no sinus tenderness, TM's clear, Oropharynx pink and moist.  Neck supple no adenopathy.  Chest: Clear to auscultation bilaterally.  CVS: S1, S2 no murmurs, no S3.  ABD: obese with rebound tenderness in right lower quadrant  Ext: No edema  MS: decreased  ROM spine,adequate in  shoulders, hips and knees.  Skin: Intact, no ulcerations or rash noted.  Psych: Good eye contact, normal affect. Memory intact not anxious or depressed appearing.  CNS: CN 2-12 intact, power, tone and sensation normal throughout.        Assessment  & Plan:

## 2010-05-29 NOTE — Assessment & Plan Note (Signed)
Uncontrolled, high triglycerides, dietary change discussed,  No med change at this time

## 2010-05-29 NOTE — Assessment & Plan Note (Signed)
3 day history, with loss of apetite, nausea

## 2010-05-30 ENCOUNTER — Encounter: Payer: Self-pay | Admitting: Family Medicine

## 2010-05-30 LAB — BASIC METABOLIC PANEL
BUN: 37 mg/dL — ABNORMAL HIGH (ref 6–23)
CO2: 26 mEq/L (ref 19–32)
Chloride: 98 mEq/L (ref 96–112)
Creatinine, Ser: 2.28 mg/dL — ABNORMAL HIGH (ref 0.4–1.2)
GFR calc Af Amer: 26 mL/min — ABNORMAL LOW (ref >60)
Glucose, Bld: 190 mg/dL — ABNORMAL HIGH (ref 70–99)

## 2010-05-30 LAB — CBC
HCT: 33.7 % — ABNORMAL LOW (ref 36.0–46.0)
Hemoglobin: 10.6 g/dL — ABNORMAL LOW (ref 12.0–15.0)
MCH: 29 pg (ref 26.0–34.0)
MCHC: 31.5 g/dL (ref 30.0–36.0)
MCV: 92.1 fL (ref 78.0–100.0)
RBC: 3.66 MIL/uL — ABNORMAL LOW (ref 3.87–5.11)

## 2010-05-30 LAB — HEPATIC FUNCTION PANEL
Albumin: 2.4 g/dL — ABNORMAL LOW (ref 3.5–5.2)
Alkaline Phosphatase: 82 U/L (ref 39–117)
Bilirubin, Direct: 0.7 mg/dL — ABNORMAL HIGH (ref 0.0–0.3)
Total Bilirubin: 1.1 mg/dL (ref 0.3–1.2)

## 2010-05-30 LAB — DIFFERENTIAL
Lymphocytes Relative: 4 % — ABNORMAL LOW (ref 12–46)
Lymphs Abs: 0.9 10*3/uL (ref 0.7–4.0)
Monocytes Absolute: 2 10*3/uL — ABNORMAL HIGH (ref 0.1–1.0)
Monocytes Relative: 9 % (ref 3–12)
Neutro Abs: 18.3 10*3/uL — ABNORMAL HIGH (ref 1.7–7.7)
Neutrophils Relative %: 86 % — ABNORMAL HIGH (ref 43–77)

## 2010-05-30 LAB — CORTISOL: Cortisol, Plasma: 28.1 ug/dL

## 2010-05-30 LAB — GLUCOSE, CAPILLARY: Glucose-Capillary: 209 mg/dL — ABNORMAL HIGH (ref 70–99)

## 2010-05-30 LAB — LACTIC ACID, PLASMA: Lactic Acid, Venous: 2 mmol/L (ref 0.5–2.2)

## 2010-05-31 LAB — BASIC METABOLIC PANEL
Chloride: 100 mEq/L (ref 96–112)
GFR calc Af Amer: 26 mL/min — ABNORMAL LOW (ref >60)
Potassium: 4.5 mEq/L (ref 3.5–5.1)

## 2010-05-31 LAB — DIFFERENTIAL
Basophils Relative: 0 % (ref 0–1)
Eosinophils Absolute: 0 10*3/uL (ref 0.0–0.7)
Lymphs Abs: 1.2 10*3/uL (ref 0.7–4.0)
Neutro Abs: 14.3 10*3/uL — ABNORMAL HIGH (ref 1.7–7.7)
Neutrophils Relative %: 85 % — ABNORMAL HIGH (ref 43–77)

## 2010-05-31 LAB — GLUCOSE, CAPILLARY
Glucose-Capillary: 193 mg/dL — ABNORMAL HIGH (ref 70–99)
Glucose-Capillary: 158 mg/dL — ABNORMAL HIGH (ref 70–99)

## 2010-05-31 LAB — CBC
Platelets: 195 10*3/uL (ref 150–400)
RBC: 3.23 MIL/uL — ABNORMAL LOW (ref 3.87–5.11)
WBC: 16.7 10*3/uL — ABNORMAL HIGH (ref 4.0–10.5)

## 2010-05-31 NOTE — Op Note (Signed)
Krista Strickland, Krista Strickland              ACCOUNT NO.:  [id number]  MEDICAL RECORD NO.:  [id number]           PATIENT TYPE:  I  LOCATION:  IC06                          FACILITY:  [facility]  PHYSICIAN:  Jamesetta So, M.D.  DATE OF BIRTH:  1950-04-15  DATE OF PROCEDURE:  05/29/2010 DATE OF DISCHARGE:                              OPERATIVE REPORT   PREOPERATIVE DIAGNOSIS:  Acute appendicitis.  POSTOPERATIVE DIAGNOSIS:  Acute appendicitis.  PROCEDURE:  Laparoscopic appendectomy.  SURGEON:  Jamesetta So, MD  ANESTHESIA:  General endotracheal.  INDICATIONS:  The patient is a morbidly obese black female who presents with a 2-day history of worsening right lower quadrant abdominal pain. CT scan of the abdomen and pelvis reveals acute appendicitis.  The risks and benefits of the procedure including bleeding, infection, and the possibly of an open procedure were fully explained to the patient, gave informed consent.  PROCEDURE NOTE:  The patient was placed in supine position.  After induction of general endotracheal anesthesia, the abdomen was prepped and draped using the usual sterile technique with DuraPrep.  Surgical site confirmation was performed.  A supraumbilical incision was made down to the fascia.  A Veress needle was introduced into the abdominal cavity and confirmation of placement was done using the saline drop test.  The abdomen was then insufflated to 16 mmHg pressure.  An 11-mm trocar was introduced into the abdominal cavity under direct visualization without difficulty.  An additional 5- mm trocar was placed in the right lower quadrant region and also along the midline just below the umbilicus.  A 12-mm trocar was placed in the suprapubic region.  The right lower quadrant was inspected and the appendix was noted to be retrocecal in nature, having ruptured in the retrocecal area.  It appeared to be necrotic at its tip and I suspect that this had been present for some  time.  A small amount of purulent fluid was found and this was evacuated without difficulty.  The appendix was freed away from the its retrocecal attachments up to its base.  A vascular Endo-GIA was placed across the base of the appendix and fired. The appendix was removed using EndoCatch bag.  The right lower quadrant was copiously irrigated with normal saline.  All fluid and air were then evacuated from the abdominal cavity prior to removal of the trocars.  All wounds were irrigated with normal saline.  All wounds were injected with 0.5% Sensorcaine.  The supraumbilical fascia as well as suprapubic fascia reapproximated using 0 Vicryl interrupted sutures.  All skin incisions were closed using staples.  Betadine ointment and dry sterile dressings were applied.  All tape and needle counts were correct at the end of the procedure. The patient was extubated in the operating room and went back to recovery room awake in stable condition.  Complications none.  SPECIMEN:  Appendix.  BLOOD LOSS:  Minimal.     Jamesetta So, M.D.     MAJ/MEDQ  D:  05/29/2010  T:  05/30/2010  Job:  [id number]  cc:   Norwood Levo. Moshe Cipro, M.D. [phone]  Electronically Signed by Aviva Signs  M.D. on 05/31/2010 [id number] PM

## 2010-06-01 LAB — DIFFERENTIAL
Basophils Relative: 0 % (ref 0–1)
Eosinophils Absolute: 0.1 10*3/uL (ref 0.0–0.7)
Neutrophils Relative %: 75 % (ref 43–77)

## 2010-06-01 LAB — CBC
MCH: 29.1 pg (ref 26.0–34.0)
Platelets: 180 10*3/uL (ref 150–400)
RBC: 2.96 MIL/uL — ABNORMAL LOW (ref 3.87–5.11)
WBC: 12.3 10*3/uL — ABNORMAL HIGH (ref 4.0–10.5)

## 2010-06-01 LAB — BASIC METABOLIC PANEL
Chloride: 104 mEq/L (ref 96–112)
Creatinine, Ser: 1.49 mg/dL — ABNORMAL HIGH (ref 0.4–1.2)
GFR calc Af Amer: 43 mL/min — ABNORMAL LOW (ref >60)
Potassium: 4 mEq/L (ref 3.5–5.1)

## 2010-06-03 NOTE — Discharge Summary (Signed)
  [name], Krista Strickland              ACCOUNT NO.:  [id number]  MEDICAL RECORD NO.:  [id number]           PATIENT TYPE:  I  LOCATION:  [id number]                          FACILITY:  [facility]  PHYSICIAN:  Jamesetta So, M.D.  DATE OF BIRTH:  1950/05/26  DATE OF ADMISSION:  05/29/2010 DATE OF DISCHARGE:  05/03/2012LH                              DISCHARGE SUMMARY   HOSPITAL COURSE SUMMARY:  The patient is a 60 year old black female who was referred to the radiology department by Dr. Tula Nakayama for a CT scan of the abdomen and pelvis for abdominal pain.  This did reveal acute appendicitis.  Surgery consultation was obtained and the patient was taken to the operating room on May 29, 2010 and underwent a laparoscopic appendectomy.  The appendix was retrocecal and very inflamed.  Postop, the patient was monitored in the Intensive Care Unit due to relative hypotension secondary to surgery, appendicitis, dehydration.  Her situation improved with fluid hydration.  She was ultimately transferred to the regular floor and has continued to do well.  Final pathology did reveal acute appendicitis.  The patient is being discharged home on postoperative day #3 in good and stable condition.  DISCHARGE INSTRUCTIONS:  The patient is follow up with Dr. Aviva Signs on Jun 06, 2010.  DISCHARGE MEDICATIONS: 1. Vicodin 1-2 tablets p.o. q.4 h. p.r.n. pain. 2. Allopurinol 300 mg p.o. daily. 3. Benazepril 40 mg p.o. daily. 4. Diltiazem CD 180 mg p.o. daily. 5. Lasix 40 mg p.o. b.i.d. 6. Glipizide 10 mg p.o. b.i.d. 7. Humulin insulin subcutaneous twice a day. 8. Metolazone 5 mg p.o. daily. 9. Metoprolol 100 mg p.o. b.i.d. 10.Potassium supplements 10 mEq p.o. b.i.d. 11.Pravastatin 20 mg p.o. daily.  PRINCIPAL DIAGNOSES: 1. Acute appendicitis. 2. Hypertension. 3. Insulin-dependent diabetes mellitus. 4. Renal insufficiency. 5. High cholesterol levels. 6. History of gout.  PRINCIPAL PROCEDURE:   Laparoscopic appendectomy on May 29, 2010.     Jamesetta So, M.D.     MAJ/MEDQ  D:  06/01/2010  T:  06/01/2010  Job:  [id number]  cc:   [place]wood Levo. Moshe Cipro, M.D. [phone]  Electronically Signed by Aviva Signs M.D. on 06/03/2010 08:07:20 PM

## 2010-06-05 ENCOUNTER — Telehealth: Payer: Self-pay | Admitting: Family Medicine

## 2010-06-05 NOTE — Telephone Encounter (Signed)
Insulin isn't going too high either during the day.  Please advise.

## 2010-06-05 NOTE — Telephone Encounter (Signed)
Will not need as much medication at this time , she is doing the right thing holding the insulin. She just had her appendix removed, so is unable to eat as much at this time therefore need for insulin is less or none, follow the readings, ok to hold med based on sugar readings I tried to spk with heart lunch, no answer, I left a message

## 2010-06-05 NOTE — Telephone Encounter (Signed)
Called patient,busy signal.

## 2010-06-07 ENCOUNTER — Other Ambulatory Visit (HOSPITAL_COMMUNITY): Payer: Self-pay | Admitting: "Endocrinology

## 2010-06-07 DIAGNOSIS — E049 Nontoxic goiter, unspecified: Secondary | ICD-10-CM

## 2010-06-07 NOTE — Telephone Encounter (Signed)
Called patient, left message.

## 2010-06-09 ENCOUNTER — Ambulatory Visit (HOSPITAL_COMMUNITY)
Admission: RE | Admit: 2010-06-09 | Discharge: 2010-06-09 | Disposition: A | Discharge status: Home / Self Care | Payer: Medicare PPO | Source: Ambulatory Visit | Attending: "Endocrinology | Admitting: "Endocrinology

## 2010-06-09 DIAGNOSIS — E042 Nontoxic multinodular goiter: Secondary | ICD-10-CM | POA: Insufficient documentation

## 2010-06-09 DIAGNOSIS — E049 Nontoxic goiter, unspecified: Secondary | ICD-10-CM

## 2010-06-09 NOTE — Telephone Encounter (Signed)
Patient aware, states Dr Dorris Fetch put her on sliding scale yesterday

## 2010-06-13 ENCOUNTER — Other Ambulatory Visit: Payer: Self-pay | Admitting: Family Medicine

## 2010-06-16 NOTE — Procedures (Signed)
   NAME:  Krista Strickland, Krista Strickland                        ACCOUNT NO.:  [id number]   MEDICAL RECORD NO.:  [id number]                   PATIENT TYPE:  OUT   LOCATION:  RAD                                  FACILITY:  [facility]   PHYSICIAN:  Scarlett Presto, M.D.                DATE OF BIRTH:  02-20-1950   DATE OF PROCEDURE:  03/13/2002  DATE OF DISCHARGE:                                  ECHOCARDIOGRAM   REFERRING PHYSICIAN:  Tesfaye D. Legrand Rams, M.D.   PROCEDURE:  Echocardiogram.  Tape #[id number], tape count 5303 to [id number].   REASON FOR PROCEDURE:  This is a 60 year old woman with hypertension and no  previous cardiac history.  Technical quality of this study was fair.   M-MODE MEASUREMENTS:  1. The aorta is 30 mm.  2. The left atrium is 48 mm, which is enlarged.  3. The septum is 17 mm, which is enlarged.  4. The posterior wall is 15 mm, which is enlarged.  5. The left ventricular diastolic dimension is 40 mm.  6. The left ventricular systolic dimension is 36 mm.   2-D AND DOPPLER IMAGING:  1. The left ventricle is normal size with reasonably severe concentric left     ventricular hypertrophy.  There is  evidence of significant upper septal     hypertrophy with an outflow velocity of approximately 4 mm per second     with cavity obliteration and hyperdynamic ventricular function,     consistent with severe hypertensive heart disease.  There are no obvious     wall motion abnormalities seen and diastolic function was not assessed     due to the hyperdynamic left ventricle.  2. The right ventricle appears to be normal size with normal systolic     function.  3. Both atria appear to be mildly enlarged.  The left atrium most     significantly.  The atrial septum did not have a defect.  4. The aortic valve was not well seen but appeared to be trileaflet with no     significant stenosis or regurgitation.  5. The mitral valve was morphologically unremarkable with mild mitral     regurgitation.  6. The  pulmonic and tricuspid valves were not well seen.  Pericardial     structures were normal.                                               Scarlett Presto, M.D.    JH/MEDQ  D:  03/13/2002  T:  03/13/2002  Job:  [id number]

## 2010-06-16 NOTE — Procedures (Signed)
NAMESAMELLA, SISKO              ACCOUNT NO.:  [id number]   MEDICAL RECORD NO.:  [id number]          PATIENT TYPE:  REC   LOCATION:                                FACILITY:  [facility]   PHYSICIAN:  Scarlett Presto, M.D.   DATE OF BIRTH:  [birth year]   DATE OF PROCEDURE:  DATE OF DISCHARGE:                                    STRESS TEST   HISTORY:  Ms. Moleski is a 60 year old female with normal coronary arteries  by cardiac catheterization in December 2005, normal LV function at that  time, now with atypical chest discomfort.   BASELINE DATA:  Electrocardiogram reveals a sinus rhythm at 70 beats a  minute, nonspecific ST abnormalities and poor R-wave progression. Blood  pressure is 110/60.   Sixty milligrams of adenosine was infused over a 4-minute protocol with  Myoview injected at 3 minutes. The patient reported chest pressure and  flushed feeling that resolved in recovery. EKG revealed no arrhythmias. She  has a baseline abnormal electrocardiogram with worsening ST depression in  inferolateral leads with effusion and increased heart rate.   Final images and results are pending M.D. review.      Cherre Blanc, P.A. [facility]      Scarlett Presto, M.D.  Electronically Signed    AB/MEDQ  D:  09/07/2004  T:  09/07/2004  Job:  [id number]

## 2010-06-16 NOTE — Op Note (Signed)
Krista Strickland, Krista Strickland              ACCOUNT NO.:  [id number]   MEDICAL RECORD NO.:  [id number]          PATIENT TYPE:  AMB   LOCATION:  DAY                           FACILITY:  [facility]   PHYSICIAN:  Caro Hight, M.D.      DATE OF BIRTH:  [birth year]   DATE OF PROCEDURE:  04/12/2006  DATE OF DISCHARGE:                               OPERATIVE REPORT   PROCEDURE:  Colonoscopy with cold forceps polypectomy.   INDICATION FOR EXAM:  Ms. Krista Strickland is a 60 year old female with no  family history of colon cancer or colon polyps.  She presents for  average risk colon cancer screening.   FINDINGS:  5 mm transverse colon polyp removed via cold forceps.  Otherwise, no masses, inflammatory changes, diverticula, AVMs, or  hemorrhoids.   RECOMMENDATIONS:  1. I will call Ms. Krista Strickland with the results were biopsies.  If her      polyp is adenomatous, then her next colonoscopy should be in five      years.  If her polyp is adenomatous, then her brothers, sisters,      and children should have a screening colonoscopy at age 60 and then      every five years.  2. High fiber diet.  Ms. Krista Strickland is given a handout on high fiber      diet and polyps.  She is also given a handout on diverticulosis.  3. Follow up with Dr. Tula Nakayama.   MEDICATIONS:  1. Demerol 100 mg IV.  2. Versed 7 mg IV.   PROCEDURE TECHNIQUE:  Physical exam was performed and informed consent  was obtained from the patient after explaining the benefits, risks and  alternatives to the procedure.  The patient was connected to the monitor  and placed in the left lateral position.  Continuous oxygen was provided  by nasal cannula and IV medicine administered through an indwelling  cannula.  After administration of sedation and rectal exam, the  patient's rectum was intubated.  The scope was advanced under direct visualization to the cecum.  The  scope was subsequently removed carefully by examining the color,  texture, anatomy, and  integrity of the mucosa on the way out.  The  patient was recovered in the endoscopy suite and discharged home in  satisfactory condition.      Caro Hight, M.D.  Electronically Signed     SM/MEDQ  D:  04/12/2006  T:  04/13/2006  Job:  [id number]   cc:   Norwood Levo. Moshe Cipro, M.D.  Fax: [phone]

## 2010-06-16 NOTE — Cardiovascular Report (Signed)
Krista Strickland, Krista Strickland              ACCOUNT NO.:  [id number]   MEDICAL RECORD NO.:  [id number]          PATIENT TYPE:  OIB   LOCATION:  NA                           FACILITY:  [facility]   PHYSICIAN:  Krista Strickland, M.D.   DATE OF BIRTH:  1950-05-20   DATE OF PROCEDURE:  01/11/2004  DATE OF DISCHARGE:                              CARDIAC CATHETERIZATION   PRIMARY CARE PHYSICIAN:  Krista Strickland, M.D.   PROCEDURES PERFORMED:  1.  Right heart catheterization.  2.  Left heart catheterization.  3.  Selective coronary angiography.  4.  Left ventriculography.  5.  Distal aortography.   HISTORY OF PRESENT ILLNESS:  Krista Strickland is a 60 year old African-American  woman who has severe hypertension, chest discomfort, and palpitations.  Her  echocardiogram showed her to have severe left ventricular concentric  hypertrophy, and her chest discomfort was concerning for coronary artery  disease.  There was also a concern for the potential for renal artery  stenosis due to her severe hypertension on multiple medications, and so  after a discussion, it was determined that she would under heart  catheterization.   DETAILS OF THE PROCEDURE:  The patient was brought to the cardiac  catheterization laboratory in the fasting state.  After obtaining informed  consent, she was prepped and draped in the usual sterile manner.  Local  anesthetic was obtained over the right groin using 1% lidocaine without  epinephrine, and the right femoral artery was cannulated using a 5-French 10-  cm sheath.  Right femoral vein was cannulated using a 7-French 10-cm sheath.  Right heart catheterization was performed using a 7-French Swan-Ganz  catheter which was advanced into the pulmonary capillary wedge position  where tracings were obtained.  Pulmonary artery tracings were then obtained  after the balloon was deflated.  The catheter was left in the main pulmonary  artery where FICK cardiac output and thermodilution  cardiac output were both  determined.  The catheter was then moved in a retrograde manner through the  right ventricle, the right atrium, and then out of the body.  Left heart  catheterization was then performed using a 5-French Judkins left #4, a 5-  French Judkins right #4, and a 5-French pigtail catheter.  The pigtail  catheter was used for a left ventriculography in the 30-degree RAO view.  After the left ventriculogram was performed, the catheter was repositioned  at the distal aorta, and distal aortography was performed in the AP view  with a power injector.  At the conclusion of the procedure, the catheters  were removed. The patient was moved back to the cardiology holding area  where the femoral artery sheath and femoral venous sheath were removed.  Hemostasis was obtained using direct manual pressure.  At the completion of  the hold, there was no evidence of ecchymosis of hematoma formation.  Distal  pulses were intact.   RESULTS:   RIGHT HEART PRESSURES:  Right atrium A wave of 18, V wave of 15, mean of 12  mmHg.   Right ventricle 53/1 with an end-diastolic pressure of 12 mmHg.   Pulmonary  artery 56/19 with a mean of 37 mmHg.   Pulmonary capillary wedge pressure A wave of 29, V wave of 30, mean of 24  mmHg.   Left ventricular pressure 160/16 with an end-diastolic pressure of 25 mmHg.   Aortic pressure 154/74 with a mean arterial pressure of 106 mmHg.   FICK cardiac output was 5.0 L/minute, cardiac index is 2.2 L/minute per  meter squared.  Thermodilution cardiac out 6.5 L/minute, cardiac index 2.9  L/minute per meter squared.   Aortic oxygen saturation 92%.  Pulmonary artery saturation 67%.   SELECTIVE CORONARY ANGIOGRAPHY:  The left main coronary artery is a large  dominant vessel which is angiographically normal.   The left anterior descending coronary artery is a moderate caliber  transapical vessel which is angiographically normal.   The left circumflex  coronary artery is a moderate caliber vessel which is  angiographically normal.   The right coronary artery is a large dominant vessel which is  angiographically normal.   Renal arteries are widely patent bilaterally.   LEFT VENTRICULOGRAM:  This reveals hyperdynamic left ventricle and ejection  fraction of 75% with no mitral regurgitation.  There is significant cavity  obliteration.   ASSESSMENT:  1.  Essential hypertension.  2.  Normal coronaries, normal left ventricular size and function.  3.  Patent renal arteries.  4.  Elevated right heart pressures consistent with severe hypertension and      hypertensive heart disease.   PLAN:  Maximize medical therapy, increase diuretics.  I will see her back in  my office for followup.      Merry Proud   JH/MEDQ  D:  01/11/2004  T:  01/11/2004  Job:  [id number]

## 2010-06-16 NOTE — Procedures (Signed)
NAMEKAMBRI, BARTKIEWICZ              ACCOUNT NO.:  [id number]   MEDICAL RECORD NO.:  [id number]          PATIENT TYPE:  OUT   LOCATION:  RAD                           FACILITY:  [facility]   PHYSICIAN:  Jacqulyn Ducking, M.D.  DATE OF BIRTH:  [birth year]   DATE OF PROCEDURE:  12/30/2003  DATE OF DISCHARGE:                                  ECHOCARDIOGRAM   REFERRING PHYSICIAN:  Dr. Moshe Cipro.  Dr. Wilhemina Cash.   CLINICAL DATA:  A 60 year old woman with chest pain and palpitations.   M-MODE:  1.  Aorta 2.6.  2.  Left atrium 4.4.  3.  Septum 1.9.  4.  Posterior wall 1.4.  5.  LV diastole 4.0.  6.  LV systole 2.4.   FINDINGS:  1.  Technically suboptimal but adequate echocardiographic study.  2.  Mild left atrial enlargement; normal right atrial size.  3.  Normal right ventricular size with borderline hypertrophy and normal      function.  4.  Minimal aortic valvular sclerosis.  5.  Slight mitral valve thickening; moderate annular calcification.  6.  Reduced left ventricular internal dimension with moderate hypertrophy      and hyperdynamic function.  7.  Normal IVC.  8.  Comparison with prior study of March 13, 2002, no significant      interval change.     Robe   RR/MEDQ  D:  12/31/2003  T:  12/31/2003  Job:  [id number]

## 2010-06-23 ENCOUNTER — Telehealth: Payer: Self-pay | Admitting: Family Medicine

## 2010-06-23 NOTE — Telephone Encounter (Signed)
Samples should be on the way,  Called patient, no answer

## 2010-06-27 NOTE — Telephone Encounter (Signed)
Patient aware i will call when samples are available

## 2010-08-01 ENCOUNTER — Encounter: Payer: Self-pay | Admitting: Family Medicine

## 2010-08-03 ENCOUNTER — Encounter: Payer: Self-pay | Admitting: Family Medicine

## 2010-08-03 ENCOUNTER — Ambulatory Visit (INDEPENDENT_AMBULATORY_CARE_PROVIDER_SITE_OTHER): Payer: Medicare PPO | Admitting: Family Medicine

## 2010-08-03 VITALS — BP 120/80 | HR 74 | Resp 16 | Ht 65.5 in | Wt 229.1 lb

## 2010-08-03 DIAGNOSIS — E785 Hyperlipidemia, unspecified: Secondary | ICD-10-CM

## 2010-08-03 DIAGNOSIS — E669 Obesity, unspecified: Secondary | ICD-10-CM

## 2010-08-03 DIAGNOSIS — M109 Gout, unspecified: Secondary | ICD-10-CM

## 2010-08-03 DIAGNOSIS — I10 Essential (primary) hypertension: Secondary | ICD-10-CM

## 2010-08-03 DIAGNOSIS — E119 Type 2 diabetes mellitus without complications: Secondary | ICD-10-CM

## 2010-08-03 MED ORDER — POTASSIUM CHLORIDE 10 MEQ PO TBCR: EXTENDED_RELEASE_TABLET | ORAL | Status: DC

## 2010-08-03 MED ORDER — METOPROLOL TARTRATE 100 MG PO TABS: 100.0000 mg | ORAL_TABLET | Freq: Two times a day (BID) | ORAL | Status: DC

## 2010-08-03 NOTE — Assessment & Plan Note (Signed)
Low fat diet encouraged, fasting lipid profile to be obtained

## 2010-08-03 NOTE — Assessment & Plan Note (Signed)
Improved o current regime, she will hopefully be able to access victoza indefinitely

## 2010-08-03 NOTE — Assessment & Plan Note (Signed)
Improved. Pt applauded on succesful weight loss through lifestyle change, and encouraged to continue same. Weight loss goal set for the next several months.  

## 2010-08-03 NOTE — Progress Notes (Signed)
  Subjective:    Patient ID: Krista Strickland, female    DOB: 1950-08-08, 60 y.o.   MRN: [id number]  HPI The PT is here for follow up and re-evaluation of chronic medical conditions, medication management and review of recent lab and radiology data.  Preventive health is updated, specifically  Cancer screening,  and Immunization.   Questions or concerns regarding consultations or procedures which the PT has had in the interim are  addressed. The PT denies any adverse reactions to current medications since the last visit.  She is now on victoza for her diabetes, has very poor apetite with excellent weight loss and improved blood sugars.She is concerned about medication cost, and I am directing her to the health dept to see if she will qualify to get it free as well as to further discuss with the endocrinologist. She has been recording blood sugars at least 4 times daily, and the values are greatly improved , and she has lost a significant amount of weight.   Review of Systems Denies recent fever or chills. Denies sinus pressure, nasal congestion, ear pain or sore throat. Denies chest congestion, productive cough or wheezing. Denies chest pains, palpitations, paroxysmal nocturnal dyspnea, orthopnea and leg swelling Denies abdominal pain, nausea, vomiting,diarrhea or constipation.  Denies rectal bleeding or change in bowel movement. Denies dysuria, frequency, hesitancy or incontinence. Chronic  joint pain,  and limitation in mobility. Denies headaches, seizure, numbness, or tingling. Denies depression, or insomnia.worried about medication cost , and her health, recently had her best friend who was diabetic die unexpectedly  Denies skin break down or rash.        Objective:   Physical Exam Patient alert and oriented and in no Cardiopulmonary distress.  HEENT: No facial asymmetry, EOMI, no sinus tenderness,  Oropharynx pink and moist.  Neck supple no adenopathy.  Chest: Clear to  auscultation bilaterally.  CVS: S1, S2 no murmurs, no S3.  ABD: Soft non tender. Bowel sounds normal.  Ext: No edema  MS: Adequate though reduced ROM spine, shoulders, hips and knees.  Skin: Intact, no ulcerations or rash noted.  Psych: Good eye contact, normal affect. Memory intact not anxious or depressed appearing.  CNS: CN 2-12 intact, power and  tone  normal throughout.        Assessment & Plan:

## 2010-08-03 NOTE — Assessment & Plan Note (Signed)
Controlled, no change in medication  

## 2010-08-03 NOTE — Assessment & Plan Note (Signed)
No recent flares, will check uric acid level

## 2010-08-03 NOTE — Patient Instructions (Addendum)
F/u in 2.5 months.  I am happy that your blood sugars are much better, pls discuss the victoza with Dr Dorris Fetch, I will give a script for a 3 month supply to tru to get help through the health dept  You need a shingles vaccine , check with your insurance to see if covered, if it is , we are happy to give you  Fasting labs in 2.5 months, lipid, uric acid, cmp and egfr

## 2010-09-19 ENCOUNTER — Encounter: Payer: Self-pay | Admitting: Family Medicine

## 2010-09-19 LAB — COMPLETE METABOLIC PANEL WITH GFR
Alkaline Phosphatase: 80 U/L (ref 39–117)
BUN: 49 mg/dL — ABNORMAL HIGH (ref 6–23)
CO2: 29 mEq/L (ref 19–32)
GFR, Est African American: 41 mL/min — ABNORMAL LOW (ref >60)
GFR, Est Non African American: 34 mL/min — ABNORMAL LOW (ref >60)
Glucose, Bld: 117 mg/dL — ABNORMAL HIGH (ref 70–99)
Sodium: 142 mEq/L (ref 135–145)
Total Bilirubin: 0.3 mg/dL (ref 0.3–1.2)
Total Protein: 6.7 g/dL (ref 6.0–8.3)

## 2010-09-19 LAB — LIPID PANEL
Cholesterol: 133 mg/dL (ref 0–200)
HDL: 25 mg/dL — ABNORMAL LOW (ref >39)
LDL Cholesterol: 58 mg/dL (ref 0–99)
Total CHOL/HDL Ratio: 5.3 Ratio
Triglycerides: 249 mg/dL — ABNORMAL HIGH (ref <150)
VLDL: 50 mg/dL — ABNORMAL HIGH (ref 0–40)

## 2010-09-19 LAB — URIC ACID: Uric Acid, Serum: 5.7 mg/dL (ref 2.4–7.0)

## 2010-09-20 ENCOUNTER — Encounter: Payer: Self-pay | Admitting: Family Medicine

## 2010-09-20 ENCOUNTER — Ambulatory Visit (INDEPENDENT_AMBULATORY_CARE_PROVIDER_SITE_OTHER): Payer: Medicare PPO | Admitting: Family Medicine

## 2010-09-20 VITALS — BP 122/70 | HR 80 | Resp 16 | Ht 63.0 in | Wt 226.8 lb

## 2010-09-20 DIAGNOSIS — I10 Essential (primary) hypertension: Secondary | ICD-10-CM

## 2010-09-20 DIAGNOSIS — E785 Hyperlipidemia, unspecified: Secondary | ICD-10-CM

## 2010-09-20 DIAGNOSIS — R5383 Other fatigue: Secondary | ICD-10-CM

## 2010-09-20 DIAGNOSIS — IMO0001 Reserved for inherently not codable concepts without codable children: Secondary | ICD-10-CM

## 2010-09-20 DIAGNOSIS — E119 Type 2 diabetes mellitus without complications: Secondary | ICD-10-CM

## 2010-09-20 DIAGNOSIS — L409 Psoriasis, unspecified: Secondary | ICD-10-CM

## 2010-09-20 DIAGNOSIS — E669 Obesity, unspecified: Secondary | ICD-10-CM

## 2010-09-20 DIAGNOSIS — L408 Other psoriasis: Secondary | ICD-10-CM

## 2010-09-20 DIAGNOSIS — M109 Gout, unspecified: Secondary | ICD-10-CM

## 2010-09-20 DIAGNOSIS — R5381 Other malaise: Secondary | ICD-10-CM

## 2010-09-20 DIAGNOSIS — M899 Disorder of bone, unspecified: Secondary | ICD-10-CM

## 2010-09-20 MED ORDER — BENAZEPRIL HCL 40 MG PO TABS: 40.0000 mg | ORAL_TABLET | Freq: Every day | ORAL | Status: DC

## 2010-09-20 MED ORDER — PRAVASTATIN SODIUM 20 MG PO TABS: 20.0000 mg | ORAL_TABLET | Freq: Every day | ORAL | Status: DC

## 2010-09-20 MED ORDER — DILTIAZEM HCL ER 180 MG PO CP24: 180.0000 mg | ORAL_CAPSULE | Freq: Every day | ORAL | Status: DC

## 2010-09-20 MED ORDER — METOLAZONE 5 MG PO TABS: ORAL_TABLET | ORAL | Status: DC

## 2010-09-20 MED ORDER — FUROSEMIDE 40 MG PO TABS: 40.0000 mg | ORAL_TABLET | Freq: Two times a day (BID) | ORAL | Status: DC

## 2010-09-20 NOTE — Patient Instructions (Addendum)
F/U in early January. Your blood sugars and labs are great, congrats, and keep it up  No med changes at this time  Mammogram due October 18 or after , pls schedule  It is important that you exercise regularly at least 30 minutes 5 times a week. If you develop chest pain, have severe difficulty breathing, or feel very tired, stop exercising immediately and seek medical attention

## 2010-09-29 ENCOUNTER — Encounter: Payer: Self-pay | Admitting: Family Medicine

## 2010-09-29 NOTE — Assessment & Plan Note (Signed)
Controlled, no change in medication  

## 2010-10-02 NOTE — Progress Notes (Signed)
  Subjective:    Patient ID: Krista Strickland, female    DOB: Oct 24, 1950, 60 y.o.   MRN: [id number]  HPI The PT is here for follow up and re-evaluation of chronic medical conditions, medication management and review of any available recent lab and radiology data.  Preventive health is updated, specifically  Cancer screening and Immunization.   Questions or concerns regarding consultations or procedures which the PT has had in the interim are  addressed. The PT denies any adverse reactions to current medications since the last visit.  There are no new concerns.  There are no specific complaints . Since seeing endocrine her blood sugars have vastly improved, and she has also lost a significant amt of weight. I advised that if she stays on same med dose for approx 1 year, I may be able to resume responsibility for her diabetes      Review of Systems See HPI Denies recent fever or chills. Denies sinus pressure, nasal congestion, ear pain or sore throat. Denies chest congestion, productive cough or wheezing. Denies chest pains, palpitations and leg swelling Denies abdominal pain, nausea, vomiting,diarrhea or constipation.   Denies dysuria, frequency, hesitancy or incontinence. Denies joint pain, swelling and limitation in mobility. Denies headaches, seizures, numbness, or tingling. Denies depression, anxiety or insomnia. No recent outbreak of her psoriasis      Objective:   Physical Exam Patient alert and oriented and in no cardiopulmonary distress.  HEENT: No facial asymmetry, EOMI, no sinus tenderness,  oropharynx pink and moist.  Neck supple no adenopathy.  Chest: Clear to auscultation bilaterally.  CVS: S1, S2 no murmurs, no S3.  ABD: Soft non tender. Bowel sounds normal.  Ext: No edema  MS: Adequate ROM spine, shoulders, hips and knees.  Skin: Intact, no ulcerations or rash noted.  Psych: Good eye contact, normal affect. Memory intact not anxious or depressed  appearing.  CNS: CN 2-12 intact, power, tone and sensation normal throughout.        Assessment & Plan:

## 2010-10-02 NOTE — Assessment & Plan Note (Signed)
Elevated triglycerides, pt counseled to reduce butter, cheese and fatty foods to improve control

## 2010-10-02 NOTE — Assessment & Plan Note (Signed)
Improved. Pt applauded on succesful weight loss through lifestyle change, and encouraged to continue same. Weight loss goal set for the next several months.  

## 2010-10-02 NOTE — Assessment & Plan Note (Signed)
Improved and controlled, pt to continue to follow with endo at this time

## 2010-10-11 ENCOUNTER — Ambulatory Visit (INDEPENDENT_AMBULATORY_CARE_PROVIDER_SITE_OTHER): Payer: Medicare PPO

## 2010-10-11 DIAGNOSIS — Z23 Encounter for immunization: Secondary | ICD-10-CM

## 2010-10-11 MED ORDER — INFLUENZA VAC TYPES A & B PF IM SUSP: 0.5000 mL | Freq: Once | INTRAMUSCULAR | Status: DC

## 2010-10-15 NOTE — Progress Notes (Signed)
  Subjective:    Patient ID: Krista Strickland, female    DOB: April 05, 1950, 60 y.o.   MRN: [id number]  HPI    Review of Systems     Objective:   Physical Exam        Assessment & Plan:  Vaccine administered with no adverse effects

## 2010-10-17 ENCOUNTER — Other Ambulatory Visit: Payer: Self-pay | Admitting: Family Medicine

## 2010-10-17 DIAGNOSIS — Z139 Encounter for screening, unspecified: Secondary | ICD-10-CM

## 2010-11-09 ENCOUNTER — Other Ambulatory Visit: Payer: Self-pay | Admitting: Family Medicine

## 2010-11-20 ENCOUNTER — Ambulatory Visit (HOSPITAL_COMMUNITY)
Admission: RE | Admit: 2010-11-20 | Discharge: 2010-11-20 | Disposition: A | Discharge status: Home / Self Care | Payer: Medicare PPO | Source: Ambulatory Visit | Attending: Family Medicine | Admitting: Family Medicine

## 2010-11-20 DIAGNOSIS — Z139 Encounter for screening, unspecified: Secondary | ICD-10-CM

## 2010-11-20 DIAGNOSIS — Z1231 Encounter for screening mammogram for malignant neoplasm of breast: Secondary | ICD-10-CM | POA: Insufficient documentation

## 2010-11-21 ENCOUNTER — Other Ambulatory Visit (HOSPITAL_COMMUNITY): Payer: Self-pay | Admitting: "Endocrinology

## 2010-11-21 DIAGNOSIS — E049 Nontoxic goiter, unspecified: Secondary | ICD-10-CM

## 2010-11-24 ENCOUNTER — Ambulatory Visit (HOSPITAL_COMMUNITY)
Admission: RE | Admit: 2010-11-24 | Discharge: 2010-11-24 | Disposition: A | Discharge status: Home / Self Care | Payer: Medicare PPO | Source: Ambulatory Visit | Attending: "Endocrinology | Admitting: "Endocrinology

## 2010-11-24 DIAGNOSIS — E049 Nontoxic goiter, unspecified: Secondary | ICD-10-CM | POA: Insufficient documentation

## 2010-12-05 ENCOUNTER — Other Ambulatory Visit: Payer: Self-pay | Admitting: Family Medicine

## 2011-01-29 ENCOUNTER — Encounter: Payer: Self-pay | Admitting: Family Medicine

## 2011-01-30 DIAGNOSIS — Z8673 Personal history of transient ischemic attack (TIA), and cerebral infarction without residual deficits: Secondary | ICD-10-CM

## 2011-01-30 HISTORY — DX: Personal history of transient ischemic attack (TIA), and cerebral infarction without residual deficits: Z86.73

## 2011-01-31 ENCOUNTER — Other Ambulatory Visit: Payer: Self-pay | Admitting: Family Medicine

## 2011-02-06 LAB — COMPLETE METABOLIC PANEL WITH GFR
ALT: 44 U/L — ABNORMAL HIGH (ref 0–35)
AST: 34 U/L (ref 0–37)
CO2: 27 mEq/L (ref 19–32)
Calcium: 9.6 mg/dL (ref 8.4–10.5)
Chloride: 106 mEq/L (ref 96–112)
Creat: 1.54 mg/dL — ABNORMAL HIGH (ref 0.50–1.10)
Sodium: 143 mEq/L (ref 135–145)
Total Bilirubin: 0.3 mg/dL (ref 0.3–1.2)
Total Protein: 6.4 g/dL (ref 6.0–8.3)

## 2011-02-06 LAB — LIPID PANEL
HDL: 26 mg/dL — ABNORMAL LOW (ref >39)
LDL Cholesterol: 53 mg/dL (ref 0–99)
Triglycerides: 150 mg/dL — ABNORMAL HIGH (ref <150)
VLDL: 30 mg/dL (ref 0–40)

## 2011-02-06 LAB — TSH: TSH: 1.174 u[IU]/mL (ref 0.350–4.500)

## 2011-02-07 ENCOUNTER — Encounter: Payer: Self-pay | Admitting: Family Medicine

## 2011-02-07 ENCOUNTER — Ambulatory Visit (INDEPENDENT_AMBULATORY_CARE_PROVIDER_SITE_OTHER): Payer: Medicare PPO | Admitting: Family Medicine

## 2011-02-07 VITALS — BP 130/66 | HR 73 | Resp 18 | Ht 63.0 in | Wt 226.0 lb

## 2011-02-07 DIAGNOSIS — E785 Hyperlipidemia, unspecified: Secondary | ICD-10-CM

## 2011-02-07 DIAGNOSIS — E119 Type 2 diabetes mellitus without complications: Secondary | ICD-10-CM

## 2011-02-07 DIAGNOSIS — I10 Essential (primary) hypertension: Secondary | ICD-10-CM

## 2011-02-07 DIAGNOSIS — E669 Obesity, unspecified: Secondary | ICD-10-CM

## 2011-02-07 DIAGNOSIS — Z13820 Encounter for screening for osteoporosis: Secondary | ICD-10-CM

## 2011-02-07 MED ORDER — BENAZEPRIL HCL 40 MG PO TABS: 40.0000 mg | ORAL_TABLET | Freq: Every day | ORAL | Status: DC

## 2011-02-07 MED ORDER — PRAVASTATIN SODIUM 20 MG PO TABS: 20.0000 mg | ORAL_TABLET | Freq: Every day | ORAL | Status: DC

## 2011-02-07 MED ORDER — METOLAZONE 5 MG PO TABS: ORAL_TABLET | ORAL | Status: DC

## 2011-02-07 MED ORDER — POTASSIUM CHLORIDE CRYS ER 10 MEQ PO TBCR: 10.0000 meq | EXTENDED_RELEASE_TABLET | Freq: Two times a day (BID) | ORAL | Status: DC

## 2011-02-07 MED ORDER — METOPROLOL TARTRATE 100 MG PO TABS: 100.0000 mg | ORAL_TABLET | Freq: Two times a day (BID) | ORAL | Status: DC

## 2011-02-07 MED ORDER — FUROSEMIDE 40 MG PO TABS: 40.0000 mg | ORAL_TABLET | Freq: Two times a day (BID) | ORAL | Status: DC

## 2011-02-07 MED ORDER — DILTIAZEM HCL ER 180 MG PO CP24: 180.0000 mg | ORAL_CAPSULE | Freq: Every day | ORAL | Status: DC

## 2011-02-07 NOTE — Patient Instructions (Addendum)
F/U in 4 months  Your cholesterol is much better, continue low fat diet , and change in medication.  You need a bone densiity scan, an appt will be made.  Call Dr Dorris Fetch about your concern with the sugar  It is important that you exercise regularly at least 30 minutes 5 times a week. If you develop chest pain, have severe difficulty breathing, or feel very tired, stop exercising immediately and seek medical attention . I encourage you to join the [employer], and commit to regular exercise   A healthy diet is rich in fruit, vegetables and whole grains. Poultry fish, nuts and beans are a healthy choice for protein rather then red meat. A low sodium diet and drinking 64 ounces of water daily is generally recommended. Oils and sweet should be limited. Carbohydrates especially for those who are diabetic or overweight, should be limited to 34-45 gram per meal. It is important to eat on a regular schedule, at least 3 times daily. Snacks should be primarily fruits, vegetables or nuts.

## 2011-02-18 NOTE — Progress Notes (Signed)
  Subjective:    Patient ID: Krista Strickland, female    DOB: 03-28-1950, 61 y.o.   MRN: [id number]  HPI The PT is here for follow up and re-evaluation of chronic medical conditions, medication management and review of any available recent lab and radiology data.  Preventive health is updated, specifically  Cancer screening and Immunization.   Questions or concerns regarding consultations or procedures which the PT has had in the interim are  addressed. The PT denies any adverse reactions to current medications since the last visit.  C/o low blood sugars particularly in the mornings, will discuss with treating  endo, I have advised she call ahead of her appt since reports sugars aslow as in the 50's    Review of Systems See HPI Denies recent fever or chills. Denies sinus pressure, nasal congestion, ear pain or sore throat. Denies chest congestion, productive cough or wheezing. Denies chest pains, palpitations and leg swelling Denies abdominal pain, nausea, vomiting,diarrhea or constipation.   Denies dysuria, frequency, hesitancy or incontinence.  Denies uncontrolled joint pain, swelling and limitation in mobility. Denies headaches, seizures, numbness, or tingling. Denies depression, anxiety or insomnia. Denies skin break down or rash.No current psoriasis flare       Objective:   Physical Exam Patient alert and oriented and in no cardiopulmonary distress.  HEENT: No facial asymmetry, EOMI, no sinus tenderness,  oropharynx pink and moist.  Neck supple no adenopathy.  Chest: Clear to auscultation bilaterally.  CVS: S1, S2 no murmurs, no S3.  ABD: Soft non tender. Bowel sounds normal.  Ext: No edema  MS: Adequate ROM spine, shoulders, hips and knees.  Skin: Intact, no ulcerations or rash noted.  Psych: Good eye contact, normal affect. Memory intact not anxious or depressed appearing.  CNS: CN 2-12 intact, power, tone and sensation normal throughout.        Assessment  & Plan:

## 2011-02-18 NOTE — Assessment & Plan Note (Signed)
Improved though still not at goal, dietary indiscretion over the holiday season is a major part of the problem

## 2011-02-18 NOTE — Assessment & Plan Note (Signed)
Currently followed by endo, c/o hypoglycemic episodes recurrently in the past 3 to 4 weeks. She is to contact treating MD re these symptoms

## 2011-02-18 NOTE — Assessment & Plan Note (Signed)
Unchanged. Patient re-educated about  the importance of commitment to a  minimum of 150 minutes of exercise per week. The importance of healthy food choices with portion control discussed. Encouraged to start a food diary, count calories and to consider  joining a support group. Sample diet sheets offered. Goals set by the patient for the next several months.    

## 2011-02-18 NOTE — Assessment & Plan Note (Signed)
Controlled, no change in medication  

## 2011-03-21 ENCOUNTER — Encounter: Payer: Self-pay | Admitting: Gastroenterology

## 2011-06-07 ENCOUNTER — Encounter: Payer: Self-pay | Admitting: Family Medicine

## 2011-06-07 ENCOUNTER — Ambulatory Visit (INDEPENDENT_AMBULATORY_CARE_PROVIDER_SITE_OTHER): Payer: Medicare PPO | Admitting: Family Medicine

## 2011-06-07 VITALS — BP 120/80 | HR 85 | Resp 15 | Ht 63.0 in | Wt 222.1 lb

## 2011-06-07 DIAGNOSIS — E785 Hyperlipidemia, unspecified: Secondary | ICD-10-CM

## 2011-06-07 DIAGNOSIS — R5383 Other fatigue: Secondary | ICD-10-CM

## 2011-06-07 DIAGNOSIS — I10 Essential (primary) hypertension: Secondary | ICD-10-CM

## 2011-06-07 DIAGNOSIS — M109 Gout, unspecified: Secondary | ICD-10-CM

## 2011-06-07 DIAGNOSIS — B369 Superficial mycosis, unspecified: Secondary | ICD-10-CM

## 2011-06-07 DIAGNOSIS — E119 Type 2 diabetes mellitus without complications: Secondary | ICD-10-CM

## 2011-06-07 DIAGNOSIS — E669 Obesity, unspecified: Secondary | ICD-10-CM

## 2011-06-07 MED ORDER — METOPROLOL TARTRATE 100 MG PO TABS: 100.0000 mg | ORAL_TABLET | Freq: Two times a day (BID) | ORAL | Status: DC

## 2011-06-07 MED ORDER — POTASSIUM CHLORIDE CRYS ER 10 MEQ PO TBCR: 10.0000 meq | EXTENDED_RELEASE_TABLET | Freq: Two times a day (BID) | ORAL | Status: DC

## 2011-06-07 MED ORDER — DILTIAZEM HCL ER 180 MG PO CP24: 180.0000 mg | ORAL_CAPSULE | Freq: Every day | ORAL | Status: DC

## 2011-06-07 NOTE — Progress Notes (Signed)
  Subjective:    Patient ID: Krista Strickland, female    DOB: 14-Nov-1950, 61 y.o.   MRN: [id number]  HPI The PT is here for follow up and re-evaluation of chronic medical conditions, medication management and review of any available recent lab and radiology data.  Preventive health is updated, specifically  Cancer screening and Immunization.   Questions or concerns regarding consultations or procedures which the PT has had in the interim are  Addressed.She is extremely pleased with her blood sugar management and the fact that it i well controlled and she has consistently been losing weight with new eaing habits. Needs to work on regular exercise The PT denies any adverse reactions to current medications since the last visit.  There are no new concerns.  There are no specific complaints       Review of Systems See HPI Denies recent fever or chills. Denies sinus pressure, nasal congestion, ear pain or sore throat. Denies chest congestion, productive cough or wheezing. Denies chest pains, palpitations and leg swelling Denies abdominal pain, nausea, vomiting,diarrhea or constipation.   Denies dysuria, frequency, hesitancy or incontinence. Mild  joint pain, and limitation in mobility. Denies headaches, seizures, numbness, or tingling. Denies depression, anxiety or insomnia. Denies skin break down or rash.        Objective:   Physical Exam Patient alert and oriented and in no cardiopulmonary distress.  HEENT: No facial asymmetry, EOMI, no sinus tenderness,  oropharynx pink and moist.  Neck supple no adenopathy.  Chest: Clear to auscultation bilaterally.  CVS: S1, S2 no murmurs, no S3.  ABD: Soft non tender. Bowel sounds normal.  Ext: No edema  MS: Adequate ROM spine, shoulders, hips and knees.  Skin: Intact, no ulcerations or rash noted.  Psych: Good eye contact, normal affect. Memory intact not anxious or depressed appearing.  CNS: CN 2-12 intact, power normal  throughout.        Assessment & Plan:

## 2011-06-07 NOTE — Patient Instructions (Addendum)
F/U in 4 to 4.5 month.  Congrats on weight loss, if you continue you will be able to get off medications  It is important that you exercise regularly at least 30 minutes 5 times a week. If you develop chest pain, have severe difficulty breathing, or feel very tired, stop exercising immediately and seek medical attention   Please join the [facility] if possible and you like it, a lot of very good exercise opportunity there.  Fasting lipid, cmp and EGFR,CBC, uric acid level  Just before next visit  Call human a and see if they cover the shingles vaccine, you need it

## 2011-06-11 NOTE — Assessment & Plan Note (Signed)
Improved, no current flare

## 2011-06-11 NOTE — Assessment & Plan Note (Signed)
Controlled, no change in medication  

## 2011-06-11 NOTE — Assessment & Plan Note (Signed)
No recent flare, stable and controlled °

## 2011-06-11 NOTE — Assessment & Plan Note (Signed)
Improved. Pt applauded on succesful weight loss through lifestyle change, and encouraged to continue same. Weight loss goal set for the next several months. Pt to commit to increased and more consistent exercise, weight loss has slowed down

## 2011-06-11 NOTE — Assessment & Plan Note (Signed)
Hyperlipidemia:Low fat diet discussed and encouraged.  Triglycerides elevated, no changes in med at this time

## 2011-06-11 NOTE — Assessment & Plan Note (Signed)
Excellent control in the care of endo

## 2011-07-23 ENCOUNTER — Encounter: Payer: Self-pay | Admitting: Family Medicine

## 2011-07-23 ENCOUNTER — Ambulatory Visit (INDEPENDENT_AMBULATORY_CARE_PROVIDER_SITE_OTHER): Payer: Medicare PPO | Admitting: Family Medicine

## 2011-07-23 ENCOUNTER — Telehealth: Payer: Self-pay | Admitting: Family Medicine

## 2011-07-23 VITALS — BP 128/72 | HR 82 | Resp 18 | Ht 63.0 in | Wt 216.1 lb

## 2011-07-23 DIAGNOSIS — E785 Hyperlipidemia, unspecified: Secondary | ICD-10-CM

## 2011-07-23 DIAGNOSIS — I639 Cerebral infarction, unspecified: Secondary | ICD-10-CM | POA: Insufficient documentation

## 2011-07-23 DIAGNOSIS — I6350 Cerebral infarction due to unspecified occlusion or stenosis of unspecified cerebral artery: Secondary | ICD-10-CM

## 2011-07-23 DIAGNOSIS — I10 Essential (primary) hypertension: Secondary | ICD-10-CM

## 2011-07-23 DIAGNOSIS — E119 Type 2 diabetes mellitus without complications: Secondary | ICD-10-CM

## 2011-07-23 NOTE — Assessment & Plan Note (Signed)
Will need updated lab if none done in [place] trying to get data from the hospital

## 2011-07-23 NOTE — Assessment & Plan Note (Signed)
Still has new neurologic deficits, left weakness, unsteady gait, blurred vision, confusion and expressive aphasia which is mild, my dx is a CVA. Increase aspirin to 325 mg daily.  I believe she needs a rept MRI brain and neurology eval asap. I will contact neurology , will try and obtain any images from hosp in [place] should she have worsening or new neurologic symptoms to go directly to the ED

## 2011-07-23 NOTE — Telephone Encounter (Signed)
Needs appt for follow up

## 2011-07-23 NOTE — Progress Notes (Signed)
  Subjective:    Patient ID: Krista Strickland, female    DOB: 11/08/1950, 61 y.o.   MRN: [id number]  HPI Hospitalized 6/20 to 6/21 at hospital in [place], dx of tIA, as soon as she left the facility she  gait instability , blurry vision and "falling to the side" Also reports she seems to be getting confused more easily and having difficulty with speech. States she had 'multiple tests" in [place] ,  was told to take baby aspirin one daily and follow up with her  PCP. My concern is that not only does the pt report abnormal motor and sensory but her exam also suggests left sided weakness, and her gait is abnormal Pt had driven to [place] the Saturday before, was very busy the following day cooking, and by Thursday of the same week had to be hospitalized.  Review of Systems See HPI Denies recent fever or chills. Denies sinus pressure, nasal congestion, ear pain or sore throat. Denies chest congestion, productive cough or wheezing. Denies chest pains, palpitations and leg swelling Denies abdominal pain, nausea, vomiting,diarrhea or constipation.   Denies dysuria, frequency, hesitancy or incontinence. Denies joint pain, swelling and limitation in mobility. Denies skin break down or rash.        Objective:   Physical Exam Patient alert and oriented and in no cardiopulmonary distress.Pt slightly confused and anxious, during the interview, speech is not fluent, as if having slight difficulty in finding words  HEENT: mild facial asymmetry, left sided weakness, EOMI, no sinus tenderness,  oropharynx pink and moist.  Neck supple no adenopathy.  Chest: Clear to auscultation bilaterally.  CVS: S1, S2 no murmurs, no S3.  ABD: Soft non tender. Bowel sounds normal.  Ext: No edema  MS: Adequate ROM spine, shoulders, hips and knees.abnormal gait, pt tending to fall to left  Skin: Intact, no ulcerations or rash noted.  Psych: Good eye contact, normal affect. Memory intact not anxious or  depressed appearing.  CNS: CN 2-12 intact,decreased  power,and sensation in left upper and lower extremity        Assessment & Plan:

## 2011-07-23 NOTE — Assessment & Plan Note (Signed)
Controlled, no change in medication  

## 2011-07-23 NOTE — Patient Instructions (Signed)
F/u in 5 weeks.  You need to start Aspirin 325mg  one daily or  take 81mg  4 tablets once daily  We will attempt to get discharge summary, also mRI brain and all imaging studies done at the hospital in [place].  Since you still have left sided weakness, I believe you had a stroke rather than a TIA, which is a mini stroke  If you start having new or worsening  symptoms of weakness, blurry vision, numbness, you need to go directly to the ED

## 2011-07-23 NOTE — Assessment & Plan Note (Signed)
Controlled, no change in medication  Followed by endo

## 2011-08-27 ENCOUNTER — Ambulatory Visit (INDEPENDENT_AMBULATORY_CARE_PROVIDER_SITE_OTHER): Payer: Medicare PPO | Admitting: Family Medicine

## 2011-08-27 ENCOUNTER — Encounter: Payer: Self-pay | Admitting: Family Medicine

## 2011-08-27 VITALS — BP 138/70 | HR 65 | Resp 16 | Ht 63.0 in | Wt 225.0 lb

## 2011-08-27 DIAGNOSIS — I10 Essential (primary) hypertension: Secondary | ICD-10-CM

## 2011-08-27 DIAGNOSIS — E785 Hyperlipidemia, unspecified: Secondary | ICD-10-CM

## 2011-08-27 DIAGNOSIS — E669 Obesity, unspecified: Secondary | ICD-10-CM

## 2011-08-27 DIAGNOSIS — E119 Type 2 diabetes mellitus without complications: Secondary | ICD-10-CM

## 2011-08-27 DIAGNOSIS — N183 Chronic kidney disease, stage 3 unspecified: Secondary | ICD-10-CM

## 2011-08-27 NOTE — Patient Instructions (Addendum)
Pelvic and breast and annual wellness in Septnmber visit  Congrats on excellent blood sugar.   No medication changes at this time.  You need the shingles vaccine, I will give you a script  It is important that you exercise regularly at least 30 minutes 5 times a week. If you develop chest pain, have severe difficulty breathing, or feel very tired, stop exercising immediately and seek medical attention   A healthy diet is rich in fruit, vegetables and whole grains. Poultry fish, nuts and beans are a healthy choice for protein rather then red meat. A low sodium diet and drinking 64 ounces of water daily is generally recommended. Oils and sweet should be limited. Carbohydrates especially for those who are diabetic or overweight, should be limited to 30-45 gram per meal. It is important to eat on a regular schedule, at least 3 times daily. Snacks should be primarily fruits, vegetables or nuts.

## 2011-08-29 ENCOUNTER — Other Ambulatory Visit: Payer: Self-pay | Admitting: Family Medicine

## 2011-09-08 NOTE — Assessment & Plan Note (Signed)
Unchanged. Patient re-educated about  the importance of commitment to a  minimum of 150 minutes of exercise per week. The importance of healthy food choices with portion control discussed. Encouraged to start a food diary, count calories and to consider  joining a support group. Sample diet sheets offered. Goals set by the patient for the next several months.    

## 2011-09-08 NOTE — Assessment & Plan Note (Signed)
Controlled, no change in medication Followed by endo

## 2011-09-08 NOTE — Assessment & Plan Note (Signed)
Hyperlipidemia:Low fat diet discussed and encouraged.  Triglycerides elevated, no med change

## 2011-09-08 NOTE — Assessment & Plan Note (Signed)
Controlled, no change in medication DASH diet and commitment to daily physical activity for a minimum of 30 minutes discussed and encouraged, as a part of hypertension management. The importance of attaining a healthy weight is also discussed.  

## 2011-09-08 NOTE — Progress Notes (Signed)
  Subjective:    Patient ID: Krista Strickland, female    DOB: April 28, 1950, 61 y.o.   MRN: [id number]  HPI The PT is here for follow up and re-evaluation of chronic medical conditions, medication management and review of any available recent lab and radiology data.  Preventive health is updated, specifically  Cancer screening and Immunization.   Questions or concerns regarding consultations or procedures which the PT has had in the interim are  Addressed.Has been followed both by neurology for recent possible tIa, and also sees endo regularly, and she is doing well The PT denies any adverse reactions to current medications since the last visit.  There are no new concerns.  There are no specific complaints       Review of Systems See HPI Denies recent fever or chills. Denies sinus pressure, nasal congestion, ear pain or sore throat. Denies chest congestion, productive cough or wheezing. Denies chest pains, palpitations and leg swelling Denies abdominal pain, nausea, vomiting,diarrhea or constipation.   Denies dysuria, frequency, hesitancy or incontinence. Denies joint pain, swelling and limitation in mobility. Denies headaches, seizures, numbness, or tingling. Denies depression, anxiety or insomnia. Denies skin break down or rash.        Objective:   Physical Exam Patient alert and oriented and in no cardiopulmonary distress.  HEENT: No facial asymmetry, EOMI, no sinus tenderness,  oropharynx pink and moist.  Neck supple no adenopathy.  Chest: Clear to auscultation bilaterally.  CVS: S1, S2 no murmurs, no S3.  ABD: Soft non tender. Bowel sounds normal.  Ext: No edema  MS: Adequate ROM spine, shoulders, hips and knees.  Skin: Intact, no ulcerations or rash noted.  Psych: Good eye contact, normal affect. Memory intact not anxious or depressed appearing.  CNS: CN 2-12 intact, power, tone and sensation normal throughout.        Assessment & Plan:

## 2011-09-08 NOTE — Assessment & Plan Note (Signed)
Improved with improved blood sugar control

## 2011-09-13 ENCOUNTER — Emergency Department (HOSPITAL_COMMUNITY)
Admission: EM | Admit: 2011-09-13 | Discharge: 2011-09-13 | Disposition: A | Discharge status: Home / Self Care | Payer: Medicare PPO | Attending: Emergency Medicine | Admitting: Emergency Medicine

## 2011-09-13 ENCOUNTER — Encounter (HOSPITAL_COMMUNITY): Payer: Self-pay | Admitting: Emergency Medicine

## 2011-09-13 DIAGNOSIS — E669 Obesity, unspecified: Secondary | ICD-10-CM | POA: Insufficient documentation

## 2011-09-13 DIAGNOSIS — R221 Localized swelling, mass and lump, neck: Secondary | ICD-10-CM | POA: Insufficient documentation

## 2011-09-13 DIAGNOSIS — E119 Type 2 diabetes mellitus without complications: Secondary | ICD-10-CM | POA: Insufficient documentation

## 2011-09-13 DIAGNOSIS — R220 Localized swelling, mass and lump, head: Secondary | ICD-10-CM | POA: Insufficient documentation

## 2011-09-13 DIAGNOSIS — T7840XA Allergy, unspecified, initial encounter: Secondary | ICD-10-CM

## 2011-09-13 DIAGNOSIS — E785 Hyperlipidemia, unspecified: Secondary | ICD-10-CM | POA: Insufficient documentation

## 2011-09-13 DIAGNOSIS — Z8673 Personal history of transient ischemic attack (TIA), and cerebral infarction without residual deficits: Secondary | ICD-10-CM | POA: Insufficient documentation

## 2011-09-13 DIAGNOSIS — I129 Hypertensive chronic kidney disease with stage 1 through stage 4 chronic kidney disease, or unspecified chronic kidney disease: Secondary | ICD-10-CM | POA: Insufficient documentation

## 2011-09-13 DIAGNOSIS — Z79899 Other long term (current) drug therapy: Secondary | ICD-10-CM | POA: Insufficient documentation

## 2011-09-13 DIAGNOSIS — N189 Chronic kidney disease, unspecified: Secondary | ICD-10-CM | POA: Insufficient documentation

## 2011-09-13 DIAGNOSIS — T50995A Adverse effect of other drugs, medicaments and biological substances, initial encounter: Secondary | ICD-10-CM | POA: Insufficient documentation

## 2011-09-13 HISTORY — DX: Cerebral infarction, unspecified: I63.9

## 2011-09-13 LAB — CBC WITH DIFFERENTIAL/PLATELET
Basophils Relative: 1 % (ref 0–1)
Eosinophils Absolute: 0.2 10*3/uL (ref 0.0–0.7)
Eosinophils Relative: 2 % (ref 0–5)
MCH: 30 pg (ref 26.0–34.0)
MCHC: 33.3 g/dL (ref 30.0–36.0)
MCV: 90.1 fL (ref 78.0–100.0)
Monocytes Relative: 12 % (ref 3–12)
Neutrophils Relative %: 55 % (ref 43–77)
Platelets: 193 10*3/uL (ref 150–400)

## 2011-09-13 LAB — BASIC METABOLIC PANEL
BUN: 42 mg/dL — ABNORMAL HIGH (ref 6–23)
Calcium: 9.6 mg/dL (ref 8.4–10.5)
GFR calc Af Amer: 37 mL/min — ABNORMAL LOW (ref >90)
GFR calc non Af Amer: 32 mL/min — ABNORMAL LOW (ref >90)
Potassium: 4.2 mEq/L (ref 3.5–5.1)
Sodium: 138 mEq/L (ref 135–145)

## 2011-09-13 MED ORDER — PREDNISONE 10 MG PO TABS: 20.0000 mg | ORAL_TABLET | Freq: Every day | ORAL | Status: DC

## 2011-09-13 MED ORDER — METHYLPREDNISOLONE SODIUM SUCC 125 MG IJ SOLR
INTRAMUSCULAR | Status: AC
  Filled 2011-09-13: qty 2

## 2011-09-13 MED ORDER — METHYLPREDNISOLONE SODIUM SUCC 125 MG IJ SOLR
125.0000 mg | Freq: Once | INTRAMUSCULAR | Status: AC
  Administered 2011-09-13: 125 mg via INTRAVENOUS

## 2011-09-13 MED ORDER — SODIUM CHLORIDE 0.9 % IV SOLN
Freq: Once | INTRAVENOUS | Status: AC
  Administered 2011-09-13: 09:00:00 via INTRAVENOUS

## 2011-09-13 MED ORDER — DIPHENHYDRAMINE HCL 50 MG/ML IJ SOLN
50.0000 mg | Freq: Once | INTRAMUSCULAR | Status: AC
  Administered 2011-09-13: 50 mg via INTRAVENOUS

## 2011-09-13 MED ORDER — DIPHENHYDRAMINE HCL 50 MG/ML IJ SOLN
INTRAMUSCULAR | Status: AC
  Administered 2011-09-13: 50 mg via INTRAVENOUS
  Filled 2011-09-13: qty 1

## 2011-09-13 MED ORDER — FAMOTIDINE IN NACL 20-0.9 MG/50ML-% IV SOLN
INTRAVENOUS | Status: AC
  Administered 2011-09-13: 20 mg via INTRAVENOUS
  Filled 2011-09-13: qty 50

## 2011-09-13 MED ORDER — DIPHENHYDRAMINE HCL 50 MG/ML IJ SOLN
50.0000 mg | Freq: Once | INTRAMUSCULAR | Status: AC
  Administered 2011-09-13: 50 mg via INTRAVENOUS
  Filled 2011-09-13: qty 1

## 2011-09-13 MED ORDER — FAMOTIDINE IN NACL 20-0.9 MG/50ML-% IV SOLN
20.0000 mg | Freq: Once | INTRAVENOUS | Status: AC
  Administered 2011-09-13: 20 mg via INTRAVENOUS

## 2011-09-13 NOTE — ED Notes (Signed)
Pt states woke up with swelling to tongue this am. Pt denies new meds or new foods. Denies trouble breathing.

## 2011-09-13 NOTE — Discharge Instructions (Signed)
Stop taking your lotensin bp medicine.  Take benadryl 25 mg every 6 hours for swelling.  Follow up tomorrow with your md. If you cannot see your md tomorrow then come back here.  Return if getting worse

## 2011-09-13 NOTE — ED Notes (Signed)
Patient with no complaints at this time. Respirations even and unlabored. Skin warm/dry. Tongue less swollen and speech clearer.  Discharge instructions reviewed with patient at this time. Pt. Verbalized understanding concerning contacting her MD and not taking Lotensin until seeing MD.  She understands to return to ER if she cannot see PMD by tomorrow. Patient given opportunity to voice concerns/ask questions. IV removed per policy and band-aid applied to site. Patient discharged at this time and left Emergency Department with steady gait.

## 2011-09-13 NOTE — ED Provider Notes (Cosign Needed)
History  This chart was scribed for Krista Diego, MD by Kevan Rosebush. This patient was seen in room APA07/APA07 and the patient's care was started at 09:17.   CSN: [id number]  Arrival date & time 09/13/11  0910   First MD Initiated Contact with Patient 09/13/11 [phone]      Chief Complaint  Patient presents with  . Allergic Reaction    (Consider location/radiation/quality/duration/timing/severity/associated sxs/prior Treatment) Krista BABAUTA is a 61 y.o. female who presents to the Emergency Department complaining of an allergic reaction with associated tongue swelling and stinging since waking this morning. Pt denies every having similar symptoms before. Pt also denies any change in medication.   Patient is a 61 y.o. female presenting with allergic reaction. The history is provided by the patient. No language interpreter was used.  Allergic Reaction The primary symptoms do not include cough, diarrhea or rash. The current episode started 3 to 5 hours ago. The problem has not changed since onset.This is a new problem.  Associated with: bp medicine. Associated symptoms comments: Tongue swelling.   Dr. Moshe Cipro in the pt's PCP.   Past Medical History  Diagnosis Date  . Hyperlipidemia   . Obesity   . Diabetes mellitus, type 2   . Hypertension   . History of MRSA infection 03/2009    on ant abdomen   . Chronic kidney disease   . Gout   . Psoriasis   . Stroke     Past Surgical History  Procedure Date  . Partial hysterectomy   . Rt,. neck biopsy   . Excison of rt breast cyst   . Appendectomy 2012    Family History  Problem Relation Age of Onset  . Gout Sister   . Hypertension Sister   . Stroke Sister   . Gout Brother   . Gout Brother   . Gout Brother   . Hypertension Brother   . Hypertension Brother     History  Substance Use Topics  . Smoking status: Never Smoker   . Smokeless tobacco: Not on file  . Alcohol Use: No    OB History    Grav Para Term Preterm  Abortions TAB SAB Ect Mult Living                  Review of Systems  Constitutional: Negative for fatigue.  HENT: Negative for congestion, sinus pressure and ear discharge.        Tongue swelling  Eyes: Negative for discharge.  Respiratory: Negative for cough.   Gastrointestinal: Negative for diarrhea.  Genitourinary: Negative for frequency and hematuria.  Musculoskeletal: Negative for back pain.  Skin: Negative for rash.  Neurological: Negative for seizures.  Hematological: Negative.   Psychiatric/Behavioral: Negative for hallucinations.    Allergies  Meloxicam; Metronidazole; Penicillins; and Sulfonamide derivatives  Home Medications   Current Outpatient Rx  Name Route Sig Dispense Refill  . ACCU-CHEK COMPACT TEST DRUM VI STRP  USE STRIPS FOR THREE TIMES DAILY TESTING. 102 each 5  . ACCU-CHEK SOFTCLIX LANCETS MISC      . ALLOPURINOL 300 MG PO TABS Oral Take 300 mg by mouth daily.      . ASPIRIN 81 MG PO TBEC Oral Take 81 mg by mouth daily.      Marland Kitchen BENAZEPRIL HCL 40 MG PO TABS  TAKE ONE TABLET BY MOUTH EVERY DAY 30 tablet 3  . CALCITRIOL 0.25 MCG PO CAPS Oral Take 0.25 mcg by mouth daily.    . CYCLOBENZAPRINE HCL  10 MG PO TABS Oral Take 10 mg by mouth 3 (three) times daily as needed.      Marland Kitchen DILTIAZEM HCL ER 180 MG PO CP24 Oral Take 1 capsule (180 mg total) by mouth daily. 30 capsule 4  . DIPHENHYDRAMINE HCL 25 MG PO CAPS Oral Take 25 mg by mouth as needed.      Marland Kitchen DOCUSATE SODIUM 100 MG PO CAPS Oral Take 100 mg by mouth 2 (two) times daily.    Marland Kitchen EXENATIDE 10 MCG/0.04ML [place] SOLN Subcutaneous Inject 0.04 mLs (10 mcg total) into the skin 2 (two) times daily with a meal. 1.2 mL   . FUROSEMIDE 40 MG PO TABS Oral Take 1 tablet (40 mg total) by mouth 2 (two) times daily. 60 tablet 4  . HYDROCODONE-ACETAMINOPHEN 7.5-325 MG PO TABS Oral Take 1 tablet by mouth every 6 (six) hours as needed.      . INSULIN GLARGINE 100 UNIT/ML  SOLN  10 Units.     Marland Kitchen METOLAZONE 5 MG PO TABS  Take 5 mg  by mouth daily. 30 tablet 4  . METOPROLOL TARTRATE 100 MG PO TABS Oral Take 1 tablet (100 mg total) by mouth 2 (two) times daily. 60 tablet 4  . MIDODRINE HCL 5 MG PO TABS Oral Take 5 mg by mouth 2 (two) times daily.    Marland Kitchen POTASSIUM CHLORIDE CRYS ER 10 MEQ PO TBCR Oral Take 1 tablet (10 mEq total) by mouth 2 (two) times daily. 240 tablet 4  . PRAVASTATIN SODIUM 20 MG PO TABS  TAKE ONE TABLET BY MOUTH EVERY DAY AT BEDTIME 30 tablet 3  . RELION INSULIN SYRINGE 31G X 5/16" 0.5 ML MISC        Triage Vitals: BP 136/66  Pulse 75  Temp 98.9 F (37.2 C) (Oral)  Ht 5\' 6"  (1.676 m)  Wt 222 lb (100.699 kg)  BMI 35.83 kg/m2  SpO2 98%  Physical Exam  Nursing note and vitals reviewed. Constitutional: She is oriented to person, place, and time. She appears well-developed.  HENT:  Head: Normocephalic and atraumatic.       Right side of the tongue is swollen but the uvula is fine. Milde swelling to the chin and anterior upper neck.  Eyes: Conjunctivae and EOM are normal. No scleral icterus.  Neck: Normal range of motion. Neck supple. No tracheal deviation present. No thyromegaly present.  Cardiovascular: Normal rate and regular rhythm.  Exam reveals no gallop and no friction rub.   No murmur heard. Pulmonary/Chest: Effort normal and breath sounds normal. No stridor. She has no wheezes. She has no rales. She exhibits no tenderness.  Abdominal: Soft. Bowel sounds are normal. She exhibits no distension. There is no tenderness. There is no rebound.  Musculoskeletal: Normal range of motion. She exhibits edema.       2+ edema in both ankles  Lymphadenopathy:    She has no cervical adenopathy.  Neurological: She is alert and oriented to person, place, and time. Coordination normal.  Skin: Skin is warm and dry. No rash noted. No erythema.  Psychiatric: She has a normal mood and affect. Her behavior is normal.    ED Course  Procedures (including critical care time) DIAGNOSTIC STUDIES: Oxygen Saturation  is 98% on room air, normal by my interpretation.    COORDINATION OF CARE: 09:21--I evaluated the patient and we discussed a treatment plan including Benadryl and pepcid to which the pt agreed.   10:43--I rechecked the pt whose swelling has slightly improved.  11:37--I rechecked the pt who is still only slightly better. I notified her that we would watch her for about another hour.   13:07--I rechecked the pt who is doing better.  Labs Reviewed - No data to display No results found.   No diagnosis found.    MDM   The chart was scribed for me under my direct supervision.  I personally performed the history, physical, and medical decision making and all procedures in the evaluation of this patient.. Pt much improved at discharge  Krista Diego, MD 09/13/11 1409

## 2011-09-14 ENCOUNTER — Encounter: Payer: Self-pay | Admitting: Family Medicine

## 2011-09-14 ENCOUNTER — Ambulatory Visit (INDEPENDENT_AMBULATORY_CARE_PROVIDER_SITE_OTHER): Payer: Medicare PPO | Admitting: Family Medicine

## 2011-09-14 VITALS — BP 138/70 | HR 100 | Resp 18 | Ht 63.0 in | Wt 223.1 lb

## 2011-09-14 DIAGNOSIS — I10 Essential (primary) hypertension: Secondary | ICD-10-CM

## 2011-09-14 DIAGNOSIS — T7840XA Allergy, unspecified, initial encounter: Secondary | ICD-10-CM

## 2011-09-14 MED ORDER — PREDNISONE 10 MG PO TABS: 20.0000 mg | ORAL_TABLET | Freq: Every day | ORAL | Status: DC

## 2011-09-14 NOTE — Progress Notes (Signed)
  Subjective:    Patient ID: Krista Strickland, female    DOB: [birth year], 61 y.o.   MRN: [id number]  HPI Pt here for ER follow-up for allergic reaction. The patient presented to the ER yesterday with right-sided face swelling and tongue swelling. She was treated with high the Pepcid, Solu-Medrol and Benadryl. After a few hours her swelling resolves. She was sent home with prednisone 20 mg daily for 5 days as well as use of Benadryl as needed. She's unaware of cause of reaction there is no change in medications or food. She is on an ACE inhibitor. This was discontinued by the ER for possible cause of reaction. She currently is doing well has not had any further tongue swelling she's not had any difficulty breathing or difficulty swallowing.   Review of Systems   GEN- denies fatigue, fever, weight loss,weakness, recent illness HEENT- denies eye drainage, change in vision, nasal discharge, CVS- denies chest pain, palpitations RESP- denies SOB, cough, wheeze ABD- denies N/V, change in stools, abd pain Neuro- denies headache, dizziness, syncope, seizure activity      Objective:   Physical Exam GEN- NAD, alert and oriented x3 HEENT- PERRL, EOMI, non injected sclera, pink conjunctiva, MMM, oropharynx clear Neck- Supple, no thryomegaly CVS- RRR, no murmur RESP-CTAB EXT- No edema Pulses- Radial, DP- 2+        Assessment & Plan:

## 2011-09-14 NOTE — Patient Instructions (Signed)
Stop the benzapril Take the prednisone as directed Use the benadryl as needed Take blood pressure twice a week and record F/U 6 weeks for blood pressure - Dr. Moshe Cipro

## 2011-09-17 DIAGNOSIS — T7840XA Allergy, unspecified, initial encounter: Secondary | ICD-10-CM | POA: Insufficient documentation

## 2011-09-17 NOTE — Assessment & Plan Note (Signed)
BP stable currently off ACEI, recheck in 6 weeks, if this starts to run high pt to call in, consider trial or ARB in the future as she does have history of proteinuria Consider increase Diltiazem as HR allows in meantime

## 2011-09-17 NOTE — Assessment & Plan Note (Signed)
Will have her proceed with prednisone burst for reaction due to possible severity with recurrence, continue benadryl , off ACEI as possible culprit

## 2011-10-01 ENCOUNTER — Other Ambulatory Visit: Payer: Self-pay | Admitting: Family Medicine

## 2011-10-22 ENCOUNTER — Other Ambulatory Visit: Payer: Self-pay | Admitting: Family Medicine

## 2011-10-23 LAB — COMPLETE METABOLIC PANEL WITH GFR
ALT: 24 U/L (ref 0–35)
AST: 22 U/L (ref 0–37)
Alkaline Phosphatase: 72 U/L (ref 39–117)
BUN: 30 mg/dL — ABNORMAL HIGH (ref 6–23)
Calcium: 9.1 mg/dL (ref 8.4–10.5)
Chloride: 105 mEq/L (ref 96–112)
Creat: 1.33 mg/dL — ABNORMAL HIGH (ref 0.50–1.10)

## 2011-10-23 LAB — CBC WITH DIFFERENTIAL/PLATELET
Eosinophils Absolute: 0.1 10*3/uL (ref 0.0–0.7)
Eosinophils Relative: 2 % (ref 0–5)
HCT: 36.4 % (ref 36.0–46.0)
Hemoglobin: 12.3 g/dL (ref 12.0–15.0)
Lymphocytes Relative: 29 % (ref 12–46)
Lymphs Abs: 2 10*3/uL (ref 0.7–4.0)
MCH: 30 pg (ref 26.0–34.0)
MCV: 88.8 fL (ref 78.0–100.0)
Monocytes Absolute: 0.8 10*3/uL (ref 0.1–1.0)
Monocytes Relative: 11 % (ref 3–12)
Platelets: 212 10*3/uL (ref 150–400)
RBC: 4.1 MIL/uL (ref 3.87–5.11)
WBC: 7 10*3/uL (ref 4.0–10.5)

## 2011-10-23 LAB — LIPID PANEL
HDL: 27 mg/dL — ABNORMAL LOW (ref >39)
LDL Cholesterol: 56 mg/dL (ref 0–99)
Total CHOL/HDL Ratio: 4.6 Ratio
VLDL: 41 mg/dL — ABNORMAL HIGH (ref 0–40)

## 2011-10-24 ENCOUNTER — Ambulatory Visit: Payer: Medicare PPO | Admitting: Family Medicine

## 2011-10-24 ENCOUNTER — Other Ambulatory Visit (HOSPITAL_COMMUNITY)
Admission: RE | Admit: 2011-10-24 | Discharge: 2011-10-24 | Disposition: A | Discharge status: Home / Self Care | Payer: Medicare PPO | Source: Ambulatory Visit | Attending: Family Medicine | Admitting: Family Medicine

## 2011-10-24 ENCOUNTER — Ambulatory Visit (INDEPENDENT_AMBULATORY_CARE_PROVIDER_SITE_OTHER): Payer: Medicare PPO | Admitting: Family Medicine

## 2011-10-24 ENCOUNTER — Encounter: Payer: Self-pay | Admitting: Family Medicine

## 2011-10-24 VITALS — BP 140/70 | HR 86 | Resp 16 | Ht 63.0 in | Wt 228.0 lb

## 2011-10-24 DIAGNOSIS — Z01419 Encounter for gynecological examination (general) (routine) without abnormal findings: Secondary | ICD-10-CM | POA: Insufficient documentation

## 2011-10-24 DIAGNOSIS — Z23 Encounter for immunization: Secondary | ICD-10-CM

## 2011-10-24 DIAGNOSIS — H5461 Unqualified visual loss, right eye, normal vision left eye: Secondary | ICD-10-CM | POA: Insufficient documentation

## 2011-10-24 DIAGNOSIS — Z1211 Encounter for screening for malignant neoplasm of colon: Secondary | ICD-10-CM

## 2011-10-24 DIAGNOSIS — H5460 Unqualified visual loss, one eye, unspecified: Secondary | ICD-10-CM

## 2011-10-24 DIAGNOSIS — Z1272 Encounter for screening for malignant neoplasm of vagina: Secondary | ICD-10-CM

## 2011-10-24 DIAGNOSIS — Z0000 Encounter for general adult medical examination without abnormal findings: Secondary | ICD-10-CM

## 2011-10-24 HISTORY — DX: Unqualified visual loss, right eye, normal vision left eye: H54.61

## 2011-10-24 LAB — POC HEMOCCULT BLD/STL (OFFICE/1-CARD/DIAGNOSTIC): Fecal Occult Blood, POC: NEGATIVE

## 2011-10-24 NOTE — Patient Instructions (Addendum)
Annual wellness in 4.5 month, please call if you need me before  Your labs are excellent, except triglycerides are slightly high, pls cut back on fried and fatty foods and cheese  Kidney function has improved  Flu vaccine today.  Check the pharmacy re shingles vaccine   Please start walking daily, this will improve your health and help with weight loss which you need

## 2011-10-24 NOTE — Progress Notes (Signed)
  Subjective:    Patient ID: Krista Strickland, female    DOB: 30-Aug-1950, 61 y.o.   MRN: [id number]  HPI Pt here for pelvic and breast exam She has no concerns or complaints., except that she has been unable to lose weight Blood sugars are excellent under the care of endo.Has noted intermittent episodes of blurred vision in right eye for the apst several weeks, will refer for further eval by opthal C/o right foot pain also Requests flu vaccine   Review of Systems See HPI Denies recent fever or chills. Denies sinus pressure, nasal congestion, ear pain or sore throat. Denies chest congestion, productive cough or wheezing. Denies chest pains, palpitations and leg swelling Denies abdominal pain, nausea, vomiting,diarrhea or constipation.   Denies dysuria, frequency, hesitancy or incontinence.  Denies headaches, seizures, numbness, or tingling. Denies depression, anxiety or insomnia. Denies skin break down or rash.        Objective:   Physical Exam Pleasant well nourished female, alert and oriented x 3, in no cardio-pulmonary distress. Afebrile. HEENT No facial trauma or asymetry. Sinuses non tender.  EOMI, PERTL, fundoscopic exam no hemorhage or exudate.  External ears normal, tympanic membranes clear. Oropharynx moist, no exudate, fair  dentition. Neck: supple, no adenopathy,JVD or thyromegaly.No bruits.  Chest: Clear to ascultation bilaterally.No crackles or wheezes. Non tender to palpation  Breast: No asymetry,no masses. No nipple discharge or inversion. No axillary or supraclavicular adenopathy. No dimpling of skin or rash on breasts  Cardiovascular system; Heart sounds normal,  S1 and  S2 ,no S3.  No murmur, or thrill. Apical beat not displaced Peripheral pulses normal.  Abdomen: Soft, non tender, no organomegaly or masses. No bruits. Bowel sounds normal. No guarding, tenderness or rebound.  Rectal:  No mass. Guaiac negative stool.  GU:(pt had abnormal pap  in the past) External genitalia normal. No lesions. Vaginal canal normal.Physioplogic  discharge. Uterus absent, no adnexal masses, no  adnexal tenderness.  Musculoskeletal exam: Full ROM of spine, hips , shoulders and knees. No deformity ,swelling or crepitus noted. No muscle wasting or atrophy.   Neurologic: Cranial nerves 2 to 12 intact. Power, tone , and reflexes normal throughout. No disturbance in gait. No tremor.  Skin: Intact, no ulceration or  erythema  noted. Pigmentation normal throughout  Psych; Normal mood and affect. Judgement and concentration normal  Diabetic Foot Check:  Appearance - no lesions, ulcers or calluses Skin - no unusual pallor or redness, onychomycosis Sensation - grossly intact to light touch Monofilament testing -  Right - Great toe, medial, central, lateral ball and posterior foot diminished Left - Great toe, medial, central, lateral ball and posterior foot diminishedPulses Left - Dorsalis Pedis and Posterior Tibia normal Right - Dorsalis Pedis and Posterior Tibia normal       Assessment & Plan:

## 2011-10-25 ENCOUNTER — Other Ambulatory Visit: Payer: Self-pay

## 2011-10-25 ENCOUNTER — Other Ambulatory Visit: Payer: Self-pay | Admitting: Family Medicine

## 2011-10-25 DIAGNOSIS — Z139 Encounter for screening, unspecified: Secondary | ICD-10-CM

## 2011-10-25 MED ORDER — FUROSEMIDE 40 MG PO TABS: 40.0000 mg | ORAL_TABLET | Freq: Two times a day (BID) | ORAL | Status: DC

## 2011-10-25 MED ORDER — DILTIAZEM HCL ER 180 MG PO CP24: 180.0000 mg | ORAL_CAPSULE | Freq: Every day | ORAL | Status: DC

## 2011-10-25 MED ORDER — METOPROLOL TARTRATE 100 MG PO TABS: 100.0000 mg | ORAL_TABLET | Freq: Two times a day (BID) | ORAL | Status: DC

## 2011-10-28 DIAGNOSIS — Z0000 Encounter for general adult medical examination without abnormal findings: Secondary | ICD-10-CM | POA: Insufficient documentation

## 2011-10-28 NOTE — Assessment & Plan Note (Signed)
Reports recent intermittent blurring of right eye vision, will refer for opthal eval

## 2011-10-28 NOTE — Assessment & Plan Note (Signed)
Pelvic and breast exam within normal, documentation is in body of note. Reviewed the need to commit to regular  physical activity, as well as to reduce carb intake and total calories to help with weight loss and improved health

## 2011-11-22 ENCOUNTER — Ambulatory Visit (HOSPITAL_COMMUNITY)
Admission: RE | Admit: 2011-11-22 | Discharge: 2011-11-22 | Disposition: A | Discharge status: Home / Self Care | Payer: Medicare PPO | Source: Ambulatory Visit | Attending: Family Medicine | Admitting: Family Medicine

## 2011-11-22 DIAGNOSIS — Z1231 Encounter for screening mammogram for malignant neoplasm of breast: Secondary | ICD-10-CM | POA: Insufficient documentation

## 2011-11-22 DIAGNOSIS — Z139 Encounter for screening, unspecified: Secondary | ICD-10-CM

## 2012-01-01 ENCOUNTER — Other Ambulatory Visit: Payer: Self-pay | Admitting: Family Medicine

## 2012-02-02 ENCOUNTER — Other Ambulatory Visit: Payer: Self-pay | Admitting: Family Medicine

## 2012-02-20 ENCOUNTER — Other Ambulatory Visit (HOSPITAL_COMMUNITY): Payer: Self-pay | Admitting: "Endocrinology

## 2012-02-20 DIAGNOSIS — E042 Nontoxic multinodular goiter: Secondary | ICD-10-CM

## 2012-03-04 ENCOUNTER — Encounter: Payer: Self-pay | Admitting: Family Medicine

## 2012-03-04 ENCOUNTER — Ambulatory Visit (INDEPENDENT_AMBULATORY_CARE_PROVIDER_SITE_OTHER): Payer: Medicare PPO | Admitting: Family Medicine

## 2012-03-04 ENCOUNTER — Telehealth: Payer: Self-pay | Admitting: Family Medicine

## 2012-03-04 ENCOUNTER — Encounter (HOSPITAL_COMMUNITY): Payer: Self-pay

## 2012-03-04 ENCOUNTER — Ambulatory Visit (HOSPITAL_COMMUNITY)
Admission: RE | Admit: 2012-03-04 | Discharge: 2012-03-04 | Disposition: A | Discharge status: Home / Self Care | Payer: Medicare PPO | Source: Ambulatory Visit | Attending: Family Medicine | Admitting: Family Medicine

## 2012-03-04 VITALS — BP 140/68 | HR 80 | Resp 18 | Ht 63.0 in | Wt 226.1 lb

## 2012-03-04 DIAGNOSIS — R1012 Left upper quadrant pain: Secondary | ICD-10-CM | POA: Insufficient documentation

## 2012-03-04 DIAGNOSIS — R109 Unspecified abdominal pain: Secondary | ICD-10-CM

## 2012-03-04 DIAGNOSIS — E669 Obesity, unspecified: Secondary | ICD-10-CM

## 2012-03-04 DIAGNOSIS — R52 Pain, unspecified: Secondary | ICD-10-CM

## 2012-03-04 DIAGNOSIS — R1084 Generalized abdominal pain: Secondary | ICD-10-CM

## 2012-03-04 DIAGNOSIS — Z1211 Encounter for screening for malignant neoplasm of colon: Secondary | ICD-10-CM

## 2012-03-04 DIAGNOSIS — R1085 Abdominal pain of multiple sites: Secondary | ICD-10-CM

## 2012-03-04 DIAGNOSIS — R319 Hematuria, unspecified: Secondary | ICD-10-CM

## 2012-03-04 DIAGNOSIS — R5383 Other fatigue: Secondary | ICD-10-CM

## 2012-03-04 DIAGNOSIS — R9389 Abnormal findings on diagnostic imaging of other specified body structures: Secondary | ICD-10-CM | POA: Insufficient documentation

## 2012-03-04 DIAGNOSIS — E119 Type 2 diabetes mellitus without complications: Secondary | ICD-10-CM

## 2012-03-04 DIAGNOSIS — R351 Nocturia: Secondary | ICD-10-CM | POA: Insufficient documentation

## 2012-03-04 DIAGNOSIS — R0789 Other chest pain: Secondary | ICD-10-CM

## 2012-03-04 DIAGNOSIS — E785 Hyperlipidemia, unspecified: Secondary | ICD-10-CM

## 2012-03-04 LAB — POCT URINALYSIS DIPSTICK
Bilirubin, UA: NEGATIVE
Ketones, UA: NEGATIVE
Leukocytes, UA: NEGATIVE
Spec Grav, UA: 1.01

## 2012-03-04 MED ORDER — IOHEXOL 300 MG/ML  SOLN
100.0000 mL | Freq: Once | INTRAMUSCULAR | Status: AC | PRN
  Administered 2012-03-04: 100 mL via INTRAVENOUS

## 2012-03-04 MED ORDER — IOHEXOL 300 MG/ML  SOLN
50.0000 mL | Freq: Once | INTRAMUSCULAR | Status: AC | PRN
  Administered 2012-03-04: 50 mL via INTRAVENOUS

## 2012-03-04 NOTE — Patient Instructions (Addendum)
Annual wellness in June, call if you need me before  You are referred for a CT scan of abdomen and pelvis due to acute abdominal pain.  Labs today, cbc and diff, cmp, amylase and lipase   Urine is being tested for infection or blood due to pain and excessive urination.  EKG in office today since pain reportedly goes up left chest to arm and neck  Rectal exam due to change in bowel movement in recent times with abdominal pain.   You will most likely need to be refered to your GI doctor with these symptoms also

## 2012-03-04 NOTE — Progress Notes (Signed)
  Subjective:    Patient ID: Krista Strickland, female    DOB: Jul 20, 1950, 62 y.o.   MRN: [id number]  HPI  6 day h/o left upper quadrant pain radiates to chest, neck , left arm up to a 10. Also has RUQ pain, and tenderness , breathing not upper body movement makes it worse No nausea, vomiting , light headedness or chills , nocturia noted. In the past month stool has been long and stringy. c/o urinary frequency denies  dysuria, no hematuria, fever or chills  Review of Systems See HPI Denies recent fever or chills. Denies sinus pressure, nasal congestion, ear pain or sore throat. Denies chest congestion, productive cough or wheezing. Denies  palpitations and leg swelling   Denies dysuria, , hesitancy or incontinence. Denies joint pain, swelling and limitation in mobility. Denies headaches, seizures, numbness, or tingling. Denies depression, anxiety or insomnia. Denies skin break down or rash.        Objective:   Physical Exam  Patient alert and oriented and in no cardiopulmonary distress.  HEENT: No facial asymmetry, EOMI, no sinus tenderness,  oropharynx pink and moist.  Neck supple no adenopathy.  Chest: Clear to auscultation bilaterally.No reproducible chest wall tenderness  CVS: S1, S2 no murmurs, no S3. EKG: NSR , no ischemia ABD: Soft generalized tenderness, no guarding or rebound. No palpable organomegaly or masses Rectal: no mass heme negative stool  Ext: No edema  MS: Adequate ROM spine, shoulders, hips and knees.  Skin: Intact, no ulcerations or rash noted.  Psych: Good eye contact, normal affect. Memory intact not anxious or depressed appearing.  CNS: CN 2-12 intact, power, tone and sensation normal throughout.       Assessment & Plan:

## 2012-03-05 ENCOUNTER — Other Ambulatory Visit: Payer: Self-pay | Admitting: Family Medicine

## 2012-03-05 DIAGNOSIS — R1900 Intra-abdominal and pelvic swelling, mass and lump, unspecified site: Secondary | ICD-10-CM

## 2012-03-05 LAB — CBC WITH DIFFERENTIAL/PLATELET
Basophils Absolute: 0.1 10*3/uL (ref 0.0–0.1)
Basophils Relative: 1 % (ref 0–1)
Eosinophils Relative: 2 % (ref 0–5)
HCT: 39.1 % (ref 36.0–46.0)
Lymphocytes Relative: 29 % (ref 12–46)
MCHC: 33.5 g/dL (ref 30.0–36.0)
Monocytes Absolute: 0.9 10*3/uL (ref 0.1–1.0)
Neutro Abs: 5 10*3/uL (ref 1.7–7.7)
Platelets: 241 10*3/uL (ref 150–400)
RDW: 16.6 % — ABNORMAL HIGH (ref 11.5–15.5)
WBC: 8.7 10*3/uL (ref 4.0–10.5)

## 2012-03-05 LAB — COMPREHENSIVE METABOLIC PANEL
ALT: 34 U/L (ref 0–35)
AST: 24 U/L (ref 0–37)
Alkaline Phosphatase: 85 U/L (ref 39–117)
BUN: 30 mg/dL — ABNORMAL HIGH (ref 6–23)
Creat: 1.28 mg/dL — ABNORMAL HIGH (ref 0.50–1.10)
Potassium: 3.4 mEq/L — ABNORMAL LOW (ref 3.5–5.3)

## 2012-03-05 LAB — POCT I-STAT, CHEM 8
Calcium, Ion: 1.2 mmol/L (ref 1.13–1.30)
Creatinine, Ser: 1.4 mg/dL — ABNORMAL HIGH (ref 0.50–1.10)
Glucose, Bld: 122 mg/dL — ABNORMAL HIGH (ref 70–99)
Hemoglobin: 13.9 g/dL (ref 12.0–15.0)
TCO2: 36 mmol/L (ref 0–100)

## 2012-03-06 ENCOUNTER — Other Ambulatory Visit: Payer: Self-pay

## 2012-03-06 ENCOUNTER — Telehealth: Payer: Self-pay | Admitting: Family Medicine

## 2012-03-06 MED ORDER — FUROSEMIDE 40 MG PO TABS: 40.0000 mg | ORAL_TABLET | Freq: Two times a day (BID) | ORAL | Status: DC

## 2012-03-06 MED ORDER — POTASSIUM CHLORIDE CRYS ER 10 MEQ PO TBCR: 10.0000 meq | EXTENDED_RELEASE_TABLET | Freq: Two times a day (BID) | ORAL | Status: DC

## 2012-03-06 NOTE — Telephone Encounter (Signed)
Patient is aware 

## 2012-03-06 NOTE — Telephone Encounter (Signed)
authorization number

## 2012-03-07 LAB — URINE CULTURE: Colony Count: 80000

## 2012-03-10 ENCOUNTER — Ambulatory Visit (HOSPITAL_COMMUNITY): Payer: Medicare PPO

## 2012-03-10 ENCOUNTER — Ambulatory Visit (HOSPITAL_COMMUNITY)
Admission: RE | Admit: 2012-03-10 | Discharge: 2012-03-10 | Disposition: A | Discharge status: Home / Self Care | Payer: Medicare PPO | Source: Ambulatory Visit | Attending: Family Medicine | Admitting: Family Medicine

## 2012-03-10 ENCOUNTER — Other Ambulatory Visit (HOSPITAL_COMMUNITY): Payer: Medicare PPO

## 2012-03-10 DIAGNOSIS — R1900 Intra-abdominal and pelvic swelling, mass and lump, unspecified site: Secondary | ICD-10-CM

## 2012-03-10 DIAGNOSIS — R9389 Abnormal findings on diagnostic imaging of other specified body structures: Secondary | ICD-10-CM | POA: Insufficient documentation

## 2012-03-10 DIAGNOSIS — R1903 Right lower quadrant abdominal swelling, mass and lump: Secondary | ICD-10-CM | POA: Insufficient documentation

## 2012-03-10 DIAGNOSIS — N838 Other noninflammatory disorders of ovary, fallopian tube and broad ligament: Secondary | ICD-10-CM | POA: Insufficient documentation

## 2012-03-11 ENCOUNTER — Other Ambulatory Visit: Payer: Self-pay | Admitting: Family Medicine

## 2012-03-11 DIAGNOSIS — N83299 Other ovarian cyst, unspecified side: Secondary | ICD-10-CM

## 2012-03-11 DIAGNOSIS — Z780 Asymptomatic menopausal state: Secondary | ICD-10-CM

## 2012-03-14 NOTE — Assessment & Plan Note (Signed)
New onset severe abdominl pain, scan asap to determine underlying pathology

## 2012-03-14 NOTE — Assessment & Plan Note (Signed)
Controlled, no change in medication Followed by endo

## 2012-03-14 NOTE — Assessment & Plan Note (Signed)
History atypical for cAd and office EKG is normal

## 2012-03-14 NOTE — Assessment & Plan Note (Signed)
Updated lab needed, triglycerides elevated when last checked Hyperlipidemia:Low fat diet discussed and encouraged.

## 2012-03-14 NOTE — Assessment & Plan Note (Signed)
New urinary frequency esp at night, check urine for infection tart with CCUA in office which is essentially negative

## 2012-03-27 ENCOUNTER — Ambulatory Visit (HOSPITAL_COMMUNITY)
Admission: RE | Admit: 2012-03-27 | Discharge: 2012-03-27 | Disposition: A | Discharge status: Home / Self Care | Payer: Medicare PPO | Source: Ambulatory Visit | Attending: "Endocrinology | Admitting: "Endocrinology

## 2012-03-27 DIAGNOSIS — E042 Nontoxic multinodular goiter: Secondary | ICD-10-CM

## 2012-04-15 ENCOUNTER — Encounter (HOSPITAL_COMMUNITY): Payer: Self-pay | Admitting: Pharmacy Technician

## 2012-04-15 NOTE — Patient Instructions (Addendum)
Krista Strickland  04/15/2012   Your procedure is scheduled on:  04/22/2012  Report to [facility] at  900  AM.  Call this number if you have problems the morning of surgery: [phone]   Remember:   Do not eat food or drink liquids after midnight.   Take these medicines the morning of surgery with A SIP OF WATER: norco,allopurinol,flexaril,diltiazem,metoprolol   Do not wear jewelry, make-up or nail polish.  Do not wear lotions, powders, or perfumes.   Do not shave 48 hours prior to surgery. Men may shave face and neck.  Do not bring valuables to the hospital.  Contacts, dentures or bridgework may not be worn into surgery.  Leave suitcase in the car. After surgery it may be brought to your room.  For patients admitted to the hospital, checkout time is 11:00 AM the day of discharge.   Patients discharged the day of surgery will not be allowed to drive  home.  Name and phone number of your driver: family  Special Instructions: Shower using CHG 2 nights before surgery and the night before surgery.  If you shower the day of surgery use CHG.  Use special wash - you have one bottle of CHG for all showers.  You should use approximately 1/3 of the bottle for each shower.   Please read over the following fact sheets that you were given: Pain Booklet, Coughing and Deep Breathing, MRSA Information, Surgical Site Infection Prevention, Anesthesia Post-op Instructions and Care and Recovery After Surgery Bilateral Salpingo-Oophorectomy Removal of both fallopian tubes and ovaries is called a Bilateral Salpingo-oophorectomy (BSO). The fallopian tubes transport the egg from the ovary to the womb (uterus). The fallopian tube is also where the sperm and egg meet and become fertilized and move down into the uterus. Usually when a BSO is done, the uterus was previously removed. Removing both tubes and ovaries will:  Put you into the menopause. You will no longer have menstrual periods.  May cause you to  have symptoms of menopause (hot flashes, night sweats, mood changes).  Not affect your sex drive or physical relationship.  Cause you to not be able to become pregnant (sterile). LET YOUR CAREGIVER KNOW ABOUT:  Allergies to food or medication.  Medications taken including herbs, eye drops, over-the-counter medications, and creams.  Use of steroids (by mouth or creams).  Previous problems with anesthetics or numbing medication.  Possibility of pregnancy, if this applies.  Your smoking habits  History of blood clots (thrombophlebitis).  History of bleeding or blood problems.  Previous surgeries.  Other health problems. RISKS AND COMPLICATIONS All surgery is associated with risks. Some of these risks are:  Injury to surrounding organs.  Bleeding.  Infection.  Blood clots in the legs or lungs.  Problems with the anesthesia.  The surgery does not help the problem.  Death. BEFORE THE PROCEDURE  Do not take aspirin or blood thinners because it can make you bleed.  Do not eat or drink anything at least 8 hours before the surgery.  Let your caregiver know if you develop a cold or an infection.  If you are being admitted the day of surgery, arrive at least 1 hour before the surgery to read and sign the necessary forms and consents.  Arrange for help when you go home from the hospital.  If you smoke, do not smoke for at least 2 weeks before the surgery. PROCEDURE  You will change into a hospital gown. Then,  you will be given an IV (intravenous) and a medication to relax you. You will be put to sleep with an anesthetic. Any hair on your lower belly (abdomen) will be removed, and a catheter will be placed in your bladder. The fallopian tubes and ovaries will be removed either through 2 very small cuts (incisions) or through large incision in the lower abdomen. AFTER THE PROCEDURE  You will be taken to the recovery room for 1 to 3 hours until your blood pressure, pulse,  and temperature are stable and you are waking up.  If you had a laparoscopy, you may be discharged in several hours.  If you had a laparoscopy, you may have shoulder pain for a day or two from air left in the abdomen. The air can irritate the nerve that goes from the diaphragm to the shoulder.  You will be given pain medication as is necessary.  The intravenous and catheter will be removed.  Have someone available to take you home from the hospital. [place]   Only take over-the-counter or prescription medicines for pain, discomfort, or fever as directed by your caregiver.  Do not take aspirin. It can cause bleeding.  Do not drive when taking pain medication.  Follow your caregiver's advice regarding diet, exercise, lifting, driving, and general activities.  You may resume your usual diet as directed and allowed.  Get plenty of rest and sleep.  Do not douche, use tampons, or have sexual intercourse until your caregiver says it is okay.  Change your bandages (dressings) as directed.  Take your temperature twice a day and write it down.  Your caregiver may recommend showers instead of baths for a few weeks.  Do not drink alcohol until your caregiver says it is okay.  If you develop constipation, you may take a mild laxative with your caregiver's permission. Bran foods and drinking fluids helps with constipation problems.  Try to have someone home with you for a week or two to help with the household activities.  Make sure you and your family understands everything about your operation and recovery.  Do not sign any legal documents until you feel normal again.  Keep all your follow-up appointments. SEEK MEDICAL CARE IF:   There is swelling, redness, or increasing pain in the wound area.  Pus is coming from the wound.  You notice a bad smell from the wound or surgical dressing.  You have pain, redness, or swelling from the intravenous site.  The wound  is breaking open (the edges are not staying together).  You feel dizzy or feel like fainting.  You develop pain or bleeding when you urinate.  You develop diarrhea.  You develop nausea and vomiting.  You develop abnormal vaginal discharge.  You develop a rash.  You have any type of abnormal reaction or develop an allergy to your medication.  You need stronger pain medication for your pain. SEEK IMMEDIATE MEDICAL CARE IF:   You develop an unexplained temperature above 100 F (37.8 C).  You develop abdominal pain.  You develop chest pain.  You develop shortness of breath.  You pass out.  You develop pain, swelling, or redness of your leg.  You develop heavy vaginal bleeding with or without blood clots. Document Released: 01/15/2005 Document Revised: 04/09/2011 Document Reviewed: 06/11/2008 [facility] Patient Information 2013 [place]. PATIENT INSTRUCTIONS POST-ANESTHESIA  IMMEDIATELY FOLLOWING SURGERY:  Do not drive or operate machinery for the first twenty four hours after surgery.  Do not make any  important decisions for twenty four hours after surgery or while taking narcotic pain medications or sedatives.  If you develop intractable nausea and vomiting or a severe headache please notify your doctor immediately.  FOLLOW-UP:  Please make an appointment with your surgeon as instructed. You do not need to follow up with anesthesia unless specifically instructed to do so.  WOUND CARE INSTRUCTIONS (if applicable):  Keep a dry clean dressing on the anesthesia/puncture wound site if there is drainage.  Once the wound has quit draining you may leave it open to air.  Generally you should leave the bandage intact for twenty four hours unless there is drainage.  If the epidural site drains for more than 36-48 hours please call the anesthesia department.  QUESTIONS?:  Please feel free to call your physician or the hospital operator if you have any questions, and they will be  happy to assist you.

## 2012-04-16 ENCOUNTER — Encounter (HOSPITAL_COMMUNITY)
Admission: RE | Admit: 2012-04-16 | Discharge: 2012-04-16 | Disposition: A | Discharge status: Home / Self Care | Payer: Medicare PPO | Source: Ambulatory Visit | Attending: Obstetrics and Gynecology | Admitting: Obstetrics and Gynecology

## 2012-04-16 ENCOUNTER — Other Ambulatory Visit: Payer: Self-pay | Admitting: Obstetrics and Gynecology

## 2012-04-16 ENCOUNTER — Encounter (HOSPITAL_COMMUNITY): Payer: Self-pay

## 2012-04-16 HISTORY — DX: Gastro-esophageal reflux disease without esophagitis: K21.9

## 2012-04-16 HISTORY — DX: Atherosclerotic heart disease of native coronary artery without angina pectoris: I25.10

## 2012-04-16 LAB — URINALYSIS, ROUTINE W REFLEX MICROSCOPIC
Bilirubin Urine: NEGATIVE
Glucose, UA: NEGATIVE mg/dL
Ketones, ur: NEGATIVE mg/dL
Protein, ur: NEGATIVE mg/dL
Urobilinogen, UA: 0.2 mg/dL (ref 0.0–1.0)

## 2012-04-16 LAB — CBC
HCT: 35.8 % — ABNORMAL LOW (ref 36.0–46.0)
Hemoglobin: 12 g/dL (ref 12.0–15.0)
MCH: 29 pg (ref 26.0–34.0)
MCHC: 33.5 g/dL (ref 30.0–36.0)
RDW: 14.8 % (ref 11.5–15.5)

## 2012-04-16 LAB — BASIC METABOLIC PANEL
BUN: 22 mg/dL (ref 6–23)
Chloride: 100 mEq/L (ref 96–112)
Creatinine, Ser: 1.2 mg/dL — ABNORMAL HIGH (ref 0.50–1.10)
GFR calc Af Amer: 55 mL/min — ABNORMAL LOW (ref >90)
Glucose, Bld: 147 mg/dL — ABNORMAL HIGH (ref 70–99)

## 2012-04-16 LAB — URINE MICROSCOPIC-ADD ON

## 2012-04-16 LAB — SURGICAL PCR SCREEN: MRSA, PCR: NEGATIVE

## 2012-04-16 NOTE — Progress Notes (Signed)
04/16/12 1012  OBSTRUCTIVE SLEEP APNEA  Have you ever been diagnosed with sleep apnea through a sleep study? No  Do you snore loudly (loud enough to be heard through closed doors)?  1  Do you often feel tired, fatigued, or sleepy during the daytime? 1  Has anyone observed you stop breathing during your sleep? 0  Do you have, or are you being treated for high blood pressure? 1  BMI more than 35 kg/m2? 1  Age over 62 years old? 1  Neck circumference greater than 40 cm/18 inches? 0  Gender: 0  Obstructive Sleep Apnea Score 5  Score 4 or greater  Results sent to PCP

## 2012-04-17 NOTE — Pre-Procedure Instructions (Signed)
Potassium 3.0. Patient already on 20 MeQ bid. Dr Patsey Berthold suggests that patient take 20 MeQ qid with meals until surg date. Dr Glo Herring is aware of this and agrees with this treatment. Called pt to make her aware but got voice mail. Left her a message to call us back at [phone] April 2012.

## 2012-04-17 NOTE — Pre-Procedure Instructions (Signed)
Patient calls back and went over increase of potassium dosage from 2 times a day to 4 times a day and she verbalizes understanding.

## 2012-04-21 NOTE — H&P (Addendum)
Krista Strickland is an 62 y.o. female. She is admitted for laparoscopic bilateral salpingo-oophorectomy for a 7 cm postmenopausal cyst with normal CA 125 level of 5.9. She is referred by    her primary care physician Dr. Moshe Cipro the patient is aware that both benign and malignant causes may be a possibility, but benign etiology is suspected, and final testing will be performed over postop week.  Pertinent Gynecological History: Menses: post-menopausal Bleeding: None Contraception: post menopausal status DES exposure: denies Blood transfusions: none Sexually transmitted diseases: no past history Previous GYN Procedures: GC and Chlamydia cultures negative  Last mammogram: normal Date:  Last pap: normal Date:  OB History: G, P   Menstrual History: Menarche age:   No LMP recorded. Patient has had a hysterectomy.    Past Medical History  Diagnosis Date  . Hyperlipidemia   . Obesity   . Diabetes mellitus, type 2   . Hypertension   . History of MRSA infection 03/2009    on ant abdomen   . Chronic kidney disease   . Gout   . Psoriasis   . Stroke   . Coronary artery disease   . GERD (gastroesophageal reflux disease)     Past Surgical History  Procedure Laterality Date  . Partial hysterectomy    . Rt,. neck biopsy    . Excison of rt breast cyst    . Appendectomy  2012    Family History  Problem Relation Age of Onset  . Gout Sister   . Hypertension Sister   . Stroke Sister   . Gout Brother   . Gout Brother   . Gout Brother   . Hypertension Brother   . Hypertension Brother     Social History:  reports that she has quit smoking. She does not have any smokeless tobacco history on file. She reports that she does not drink alcohol or use illicit drugs.  Allergies:  Allergies  Allergen Reactions  . Benazepril Swelling  . Meloxicam Hives  . Metronidazole Hives  . Penicillins Swelling  . Sulfonamide Derivatives Hives    No prescriptions prior to admission    Review  of Systems  HENT: Negative for hearing loss and tinnitus.   Eyes: Negative.   Cardiovascular: Positive for chest pain. Negative for palpitations.  Gastrointestinal: Negative for heartburn, nausea, abdominal pain, diarrhea, constipation, blood in stool and melena.  Genitourinary: Negative for dysuria, frequency and hematuria.  Neurological: Positive for headaches.  . 2 There were no vitals taken for this visit. Physical Exam Physical Examination HEENT: MUltiple small skin tags on face and neck, bilateral, with removal planned. Pt aware that not all can be removed.  Neck - supple, no significant adenopathy, bilateral symmetric anterior adenopathy Lymphatics - no palpable lymphadenopathy, no hepatosplenomegaly, not examined Heart - normal rate and regular rhythm, S1 and S2 normal Pelvic - efg normal            Vagina normal            Healed cuff, good support            Adnexa cystic mass. Per u/s   No results found for this or any previous visit (from the past 24 hour(s)).  No results found.  Assessment/Plan: Cystic postmenopausal ovarian cyst wht normal ca125 Multiple skin tags, for removal Renate Danh V plan laparoscopic removal ovarian mass  Then ;removal of multiple skin tags.. 04/21/2012, 11:39 PM

## 2012-04-22 ENCOUNTER — Ambulatory Visit (HOSPITAL_COMMUNITY)
Admission: RE | Admit: 2012-04-22 | Discharge: 2012-04-22 | Disposition: A | Discharge status: Home / Self Care | Payer: Medicare PPO | Source: Ambulatory Visit | Attending: Obstetrics and Gynecology | Admitting: Obstetrics and Gynecology

## 2012-04-22 ENCOUNTER — Encounter (HOSPITAL_COMMUNITY): Payer: Self-pay | Admitting: Anesthesiology

## 2012-04-22 ENCOUNTER — Ambulatory Visit (HOSPITAL_COMMUNITY): Payer: Medicare PPO | Admitting: Anesthesiology

## 2012-04-22 ENCOUNTER — Encounter (HOSPITAL_COMMUNITY)
Admission: RE | Disposition: A | Discharge status: Home / Self Care | Payer: Self-pay | Source: Ambulatory Visit | Attending: Obstetrics and Gynecology

## 2012-04-22 DIAGNOSIS — Z01812 Encounter for preprocedural laboratory examination: Secondary | ICD-10-CM | POA: Insufficient documentation

## 2012-04-22 DIAGNOSIS — N83209 Unspecified ovarian cyst, unspecified side: Secondary | ICD-10-CM | POA: Insufficient documentation

## 2012-04-22 DIAGNOSIS — L909 Atrophic disorder of skin, unspecified: Secondary | ICD-10-CM

## 2012-04-22 DIAGNOSIS — L989 Disorder of the skin and subcutaneous tissue, unspecified: Secondary | ICD-10-CM

## 2012-04-22 DIAGNOSIS — N7013 Chronic salpingitis and oophoritis: Secondary | ICD-10-CM

## 2012-04-22 DIAGNOSIS — I10 Essential (primary) hypertension: Secondary | ICD-10-CM | POA: Insufficient documentation

## 2012-04-22 DIAGNOSIS — L919 Hypertrophic disorder of the skin, unspecified: Secondary | ICD-10-CM

## 2012-04-22 DIAGNOSIS — R1084 Generalized abdominal pain: Secondary | ICD-10-CM

## 2012-04-22 DIAGNOSIS — N959 Unspecified menopausal and perimenopausal disorder: Secondary | ICD-10-CM | POA: Insufficient documentation

## 2012-04-22 DIAGNOSIS — E119 Type 2 diabetes mellitus without complications: Secondary | ICD-10-CM | POA: Insufficient documentation

## 2012-04-22 HISTORY — PX: LAPAROSCOPIC SALPINGO OOPHERECTOMY: SHX5927

## 2012-04-22 HISTORY — PX: MASS EXCISION: SHX2000

## 2012-04-22 LAB — POCT I-STAT 4, (NA,K, GLUC, HGB,HCT)
Glucose, Bld: 177 mg/dL — ABNORMAL HIGH (ref 70–99)
HCT: 41 % (ref 36.0–46.0)
Hemoglobin: 13.9 g/dL (ref 12.0–15.0)
Potassium: 3.3 mEq/L — ABNORMAL LOW (ref 3.5–5.1)
Sodium: 141 mEq/L (ref 135–145)

## 2012-04-22 LAB — GLUCOSE, CAPILLARY: Glucose-Capillary: 215 mg/dL — ABNORMAL HIGH (ref 70–99)

## 2012-04-22 SURGERY — SALPINGO-OOPHORECTOMY, LAPAROSCOPIC
Anesthesia: General | Site: Abdomen | Laterality: Right | Wound class: Clean Contaminated

## 2012-04-22 MED ORDER — BUPIVACAINE-EPINEPHRINE PF 0.5-1:200000 % IJ SOLN
INTRAMUSCULAR | Status: AC
  Filled 2012-04-22: qty 10

## 2012-04-22 MED ORDER — ROCURONIUM BROMIDE 100 MG/10ML IV SOLN
INTRAVENOUS | Status: DC | PRN
  Administered 2012-04-22: 10 mg via INTRAVENOUS
  Administered 2012-04-22: 35 mg via INTRAVENOUS
  Administered 2012-04-22: 5 mg via INTRAVENOUS

## 2012-04-22 MED ORDER — ONDANSETRON HCL 4 MG/2ML IJ SOLN
INTRAMUSCULAR | Status: AC
  Filled 2012-04-22: qty 2

## 2012-04-22 MED ORDER — SUCCINYLCHOLINE CHLORIDE 20 MG/ML IJ SOLN
INTRAMUSCULAR | Status: AC
  Filled 2012-04-22: qty 1

## 2012-04-22 MED ORDER — FENTANYL CITRATE 0.05 MG/ML IJ SOLN
INTRAMUSCULAR | Status: AC
  Filled 2012-04-22: qty 5

## 2012-04-22 MED ORDER — GLYCOPYRROLATE 0.2 MG/ML IJ SOLN
INTRAMUSCULAR | Status: AC
  Filled 2012-04-22: qty 1

## 2012-04-22 MED ORDER — ONDANSETRON HCL 4 MG/2ML IJ SOLN: 4.0000 mg | Freq: Once | INTRAMUSCULAR | Status: DC | PRN

## 2012-04-22 MED ORDER — CEFAZOLIN SODIUM-DEXTROSE 2-3 GM-% IV SOLR
INTRAVENOUS | Status: AC
  Filled 2012-04-22: qty 50

## 2012-04-22 MED ORDER — GLYCOPYRROLATE 0.2 MG/ML IJ SOLN
INTRAMUSCULAR | Status: DC | PRN
  Administered 2012-04-22 (×2): 0.2 mg via INTRAVENOUS

## 2012-04-22 MED ORDER — LIDOCAINE HCL (PF) 1 % IJ SOLN
INTRAMUSCULAR | Status: AC
  Filled 2012-04-22: qty 5

## 2012-04-22 MED ORDER — SODIUM CHLORIDE 0.9 % IR SOLN
Status: DC | PRN
  Administered 2012-04-22: 3000 mL

## 2012-04-22 MED ORDER — ROCURONIUM BROMIDE 50 MG/5ML IV SOLN
INTRAVENOUS | Status: AC
  Filled 2012-04-22: qty 1

## 2012-04-22 MED ORDER — MIDAZOLAM HCL 2 MG/2ML IJ SOLN
1.0000 mg | INTRAMUSCULAR | Status: DC | PRN
  Administered 2012-04-22: 2 mg via INTRAVENOUS

## 2012-04-22 MED ORDER — METHYLENE BLUE 1 % INJ SOLN
INTRAMUSCULAR | Status: AC
  Filled 2012-04-22: qty 1

## 2012-04-22 MED ORDER — LACTATED RINGERS IV SOLN
INTRAVENOUS | Status: DC
  Administered 2012-04-22 (×4): via INTRAVENOUS

## 2012-04-22 MED ORDER — FENTANYL CITRATE 0.05 MG/ML IJ SOLN
INTRAMUSCULAR | Status: DC | PRN
  Administered 2012-04-22 (×3): 50 ug via INTRAVENOUS

## 2012-04-22 MED ORDER — SUCCINYLCHOLINE CHLORIDE 20 MG/ML IJ SOLN
INTRAMUSCULAR | Status: DC | PRN
  Administered 2012-04-22: 120 mg via INTRAVENOUS

## 2012-04-22 MED ORDER — MIDAZOLAM HCL 2 MG/2ML IJ SOLN
INTRAMUSCULAR | Status: AC
  Filled 2012-04-22: qty 2

## 2012-04-22 MED ORDER — CEFAZOLIN SODIUM-DEXTROSE 2-3 GM-% IV SOLR
2.0000 g | INTRAVENOUS | Status: AC
  Administered 2012-04-22: 2 g via INTRAVENOUS

## 2012-04-22 MED ORDER — LIDOCAINE HCL (CARDIAC) 10 MG/ML IV SOLN
INTRAVENOUS | Status: DC | PRN
  Administered 2012-04-22: 20 mg via INTRAVENOUS

## 2012-04-22 MED ORDER — OXYCODONE-ACETAMINOPHEN 5-325 MG PO TABS: 1.0000 | ORAL_TABLET | ORAL | Status: DC | PRN

## 2012-04-22 MED ORDER — NEOSTIGMINE METHYLSULFATE 1 MG/ML IJ SOLN
INTRAMUSCULAR | Status: DC | PRN
  Administered 2012-04-22: 1 mg via INTRAVENOUS

## 2012-04-22 MED ORDER — BUPIVACAINE HCL (PF) 0.5 % IJ SOLN
INTRAMUSCULAR | Status: AC
  Filled 2012-04-22: qty 30

## 2012-04-22 MED ORDER — BUPIVACAINE-EPINEPHRINE (PF) 0.5% -1:200000 IJ SOLN
INTRAMUSCULAR | Status: DC | PRN
  Administered 2012-04-22: 30 mL

## 2012-04-22 MED ORDER — ONDANSETRON HCL 4 MG/2ML IJ SOLN
4.0000 mg | Freq: Once | INTRAMUSCULAR | Status: AC
  Administered 2012-04-22: 4 mg via INTRAVENOUS

## 2012-04-22 MED ORDER — 0.9 % SODIUM CHLORIDE (POUR BTL) OPTIME
TOPICAL | Status: DC | PRN
  Administered 2012-04-22: 1000 mL

## 2012-04-22 MED ORDER — PROPOFOL 10 MG/ML IV BOLUS
INTRAVENOUS | Status: DC | PRN
  Administered 2012-04-22: 140 mg via INTRAVENOUS
  Administered 2012-04-22: 20 mg via INTRAVENOUS

## 2012-04-22 MED ORDER — FENTANYL CITRATE 0.05 MG/ML IJ SOLN: 25.0000 ug | INTRAMUSCULAR | Status: DC | PRN

## 2012-04-22 MED ORDER — PROPOFOL 10 MG/ML IV EMUL
INTRAVENOUS | Status: AC
  Filled 2012-04-22: qty 20

## 2012-04-22 SURGICAL SUPPLY — 47 items
BAG HAMPER (MISCELLANEOUS) ×3 IMPLANT
BLADE SURG SZ11 CARB STEEL (BLADE) ×3 IMPLANT
CLOTH BEACON ORANGE TIMEOUT ST (SAFETY) ×3 IMPLANT
COVER LIGHT HANDLE STERIS (MISCELLANEOUS) ×6 IMPLANT
DERMABOND ADVANCED (GAUZE/BANDAGES/DRESSINGS) ×1
DERMABOND ADVANCED .7 DNX12 (GAUZE/BANDAGES/DRESSINGS) ×2 IMPLANT
DRESSING COVERLET 3X1 FLEXIBLE (GAUZE/BANDAGES/DRESSINGS) ×18 IMPLANT
ELECT NEEDLE BLADE 2-5/6 (NEEDLE) ×3 IMPLANT
ELECT REM PT RETURN 9FT ADLT (ELECTROSURGICAL) ×3
ELECTRODE REM PT RTRN 9FT ADLT (ELECTROSURGICAL) ×2 IMPLANT
GLOVE BIOGEL PI IND STRL 7.0 (GLOVE) ×4 IMPLANT
GLOVE BIOGEL PI INDICATOR 7.0 (GLOVE) ×2
GLOVE ECLIPSE 9.0 STRL (GLOVE) ×6 IMPLANT
GLOVE INDICATOR STER SZ 9 (GLOVE) ×3 IMPLANT
GLOVE SS BIOGEL STRL SZ 6.5 (GLOVE) ×4 IMPLANT
GLOVE SUPERSENSE BIOGEL SZ 6.5 (GLOVE) ×2
GOWN STRL REIN 3XL LVL4 (GOWN DISPOSABLE) ×3 IMPLANT
GOWN STRL REIN XL XLG (GOWN DISPOSABLE) ×9 IMPLANT
INST SET LAPROSCOPIC GYN AP (KITS) ×3 IMPLANT
IV NS IRRIG 3000ML ARTHROMATIC (IV SOLUTION) ×3 IMPLANT
KIT ROOM TURNOVER AP CYSTO (KITS) ×3 IMPLANT
MANIFOLD NEPTUNE II (INSTRUMENTS) ×3 IMPLANT
NEEDLE HYPO 18GX1.5 BLUNT FILL (NEEDLE) IMPLANT
NEEDLE HYPO 25X1 1.5 SAFETY (NEEDLE) ×3 IMPLANT
NEEDLE INSUFFLATION 14GA 120MM (NEEDLE) ×3 IMPLANT
PACK PERI GYN (CUSTOM PROCEDURE TRAY) ×3 IMPLANT
PAD ARMBOARD 7.5X6 YLW CONV (MISCELLANEOUS) ×3 IMPLANT
SCALPEL HARMONIC ACE (MISCELLANEOUS) ×3 IMPLANT
SET BASIN LINEN APH (SET/KITS/TRAYS/PACK) ×3 IMPLANT
SET IRRIG TUBING LAPAROSCOPIC (IRRIGATION / IRRIGATOR) ×3 IMPLANT
SET TUBE IRRIG SUCTION NO TIP (IRRIGATION / IRRIGATOR) IMPLANT
SLEEVE Z-THREAD 5X100MM (TROCAR) ×3 IMPLANT
SOL PREP PROV IODINE SCRUB 4OZ (MISCELLANEOUS) IMPLANT
SOLUTION ANTI FOG 6CC (MISCELLANEOUS) ×3 IMPLANT
STRIP CLOSURE SKIN 1/4X3 (GAUZE/BANDAGES/DRESSINGS) ×3 IMPLANT
SUT VIC AB 4-0 PS2 27 (SUTURE) ×3 IMPLANT
SUT VICRYL 0 UR6 27IN ABS (SUTURE) ×3 IMPLANT
SYR BULB IRRIGATION 50ML (SYRINGE) ×3 IMPLANT
SYR CONTROL 10ML LL (SYRINGE) ×3 IMPLANT
SYRINGE 10CC LL (SYRINGE) ×3 IMPLANT
TRAY FOLEY CATH 16FR SILVER (SET/KITS/TRAYS/PACK) ×3 IMPLANT
TROCAR Z-THAD FIOS HNDL 12X100 (TROCAR) ×3 IMPLANT
TROCAR Z-THRD FIOS HNDL 11X100 (TROCAR) ×3 IMPLANT
TROCAR Z-THREAD FIOS 5X100MM (TROCAR) ×3 IMPLANT
TUBING DYE INJECTION 900-617 (MISCELLANEOUS) IMPLANT
TUBING INSUFFLATION HIGH FLOW (TUBING) ×3 IMPLANT
WARMER LAPAROSCOPE (MISCELLANEOUS) ×3 IMPLANT

## 2012-04-22 NOTE — Interval H&P Note (Signed)
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 After consideration of risks, benefits and other options for treatment, the patient has consented to  Procedure(s): LAPAROSCOPIC SALPINGO OOPHORECTOMY (Bilateral) EXCISION SKIN TAGS NECK AND HEAD (N/A) as a surgical intervention .  The patient's history has been reviewed, patient examined, no change in status, stable for surgery.  I have reviewed the patient's chart and labs.  Questions were answered to the patient's satisfaction.     Jonnie Kind

## 2012-04-22 NOTE — Anesthesia Postprocedure Evaluation (Signed)
Anesthesia Post Note  Patient: Krista Strickland  Procedure(s) Performed: Procedure(s) (LRB): LAPAROSCOPIC SALPINGO OOPHORECTOMY (Right) EXCISION SKIN TAGS NECK AND HEAD (N/A)  Anesthesia type: General  Patient location: PACU  Post pain: Pain level controlled  Post assessment: Post-op Vital signs reviewed, Patient's Cardiovascular Status Stable, Respiratory Function Stable, Patent Airway, No signs of Nausea or vomiting and Pain level controlled  Last Vitals:  Filed Vitals:   04/22/12 1210  BP: 170/69  Pulse: 75  Temp: 36.7 C  Resp: 18    Post vital signs: Reviewed and stable  Level of consciousness: awake and alert   Complications: No apparent anesthesia complications

## 2012-04-22 NOTE — Anesthesia Preprocedure Evaluation (Addendum)
Anesthesia Evaluation  Patient identified by MRN, date of birth, ID band Patient awake    Reviewed: Allergy & Precautions, H&P , NPO status , Patient's Chart, lab work & pertinent test results  Airway Mallampati: II TM Distance: >3 FB     Dental  (+) Teeth Intact   Pulmonary sleep apnea , former smoker,  breath sounds clear to auscultation        Cardiovascular hypertension, Pt. on medications + CAD Rhythm:Regular Rate:Normal     Neuro/Psych CVA    GI/Hepatic GERD-  Medicated and Controlled,  Endo/Other  diabetes, Type 2, Insulin Dependent  Renal/GU Renal InsufficiencyRenal disease     Musculoskeletal   Abdominal   Peds  Hematology   Anesthesia Other Findings   Reproductive/Obstetrics                          Anesthesia Physical Anesthesia Plan  ASA: III  Anesthesia Plan: General   Post-op Pain Management:    Induction: Intravenous, Rapid sequence and Cricoid pressure planned  Airway Management Planned: Oral ETT  Additional Equipment:   Intra-op Plan:   Post-operative Plan: Extubation in OR  Informed Consent: I have reviewed the patients History and Physical, chart, labs and discussed the procedure including the risks, benefits and alternatives for the proposed anesthesia with the patient or authorized representative who has indicated his/her understanding and acceptance.     Plan Discussed with:   Anesthesia Plan Comments:         Anesthesia Quick Evaluation

## 2012-04-22 NOTE — Anesthesia Procedure Notes (Signed)
Procedure Name: Intubation Date/Time: 04/22/2012 8:45 AM Performed by: Vista Deck Pre-anesthesia Checklist: Patient identified, Patient being monitored, Timeout performed, Emergency Drugs available and Suction available Patient Re-evaluated:Patient Re-evaluated prior to inductionOxygen Delivery Method: Circle System Utilized Preoxygenation: Pre-oxygenation with 100% oxygen Intubation Type: IV induction, Rapid sequence and Cricoid Pressure applied Laryngoscope Size: Miller and 2 Grade View: Grade I Tube type: Oral Tube size: 7.0 mm Number of attempts: 1 Airway Equipment and Method: stylet Placement Confirmation: ETT inserted through vocal cords under direct vision,  positive ETCO2 and breath sounds checked- equal and bilateral Secured at: 21 cm Tube secured with: Tape Dental Injury: Teeth and Oropharynx as per pre-operative assessment

## 2012-04-22 NOTE — Brief Op Note (Signed)
04/22/2012  12:32 PM  PATIENT:  Krista Strickland  62 y.o. 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  POST-OPERATIVE DIAGNOSIS:  Right hydrosalpinx ,hydrosalpinx right ovary  PROCEDURE:  Procedure(s) with comments: LAPAROSCOPIC SALPINGO OOPHORECTOMY (Right) - end 11:17 EXCISION SKIN TAGS NECK AND HEAD (N/A) - start 11:19  SURGEON:  Surgeon(s) and Role:    * Jonnie Kind, MD - Primary  PHYSICIAN ASSISTANT:   ASSISTANTS: None   ANESTHESIA:   general  EBL:  Total I/O In: 2300 [I.V.:2300] Out: 125 [Urine:100; Blood:25]  BLOOD ADMINISTERED:none  DRAINS: none   LOCAL MEDICATIONS USED:  NONE  SPECIMEN:  Source of Specimen:  Right tube and ovary  DISPOSITION OF SPECIMEN:  PATHOLOGY  COUNTS:  YES  TOURNIQUET:  * No tourniquets in log *  DICTATION: .Dragon Dictation Patient was taken operating room prepped and draped for combined abdominal and vaginal procedure, with Foley catheter in place and timeout conducted. Administration of antibiotics performed. Infraumbilical vertical 1 cm skin incisions as well as a transverse suprapubic incision was made Veress needle was used through the umbilicus due to the pneumoperitoneum under 12 mm mercury pressure. A 11 mm laparoscopic trocar was inserted through the umbilicus and suprapubic trocar inserted also under direct visualization. Right and left lower quadrant 5 mm trochars were inserted. Attention was directed to the pelvis were then adhesions were taken down from the sigmoid colon lying visualization the pelvis which included the cystic structure which came from the right adnexa. Thin filmy adhesions were progressively dissected such that it was identified as a right hydrosalpinx adjacent to relatively normal right ovary approximately 3-1/2 cm maximum length. Harmonic scalpel was used progressively to do to dissect the cystic structure for a from adjacent pelvic sidewall and  sigmoid colon. At no time was there suspicion of bowel injury. Once the specimen was mobilized sufficiently, the infundibulopelvic ligament was isolated by traction and countertraction, retroperitoneal entry allowing direct visualization of peristalsis of the right ureter which allowed isolation of the right infundibulopelvic ligament which was then transected with harmonic scalpel and the specimen amputated off carefully.  On the left side the tube and ovary were visualized but were densely adherent to the pelvic sidewall. One: Was further mobilized to allow visualization, and the retroperitoneum was entered lateral to the left IP ligament. An extensive efforts to identify the ureter were made and the ureter could not be identified. The ovary on the left was very atrophic, but densely adherent to the sidewall.f the clinical decision was made to risk jeopardizing the ureter was a greater risk the patient then leaving the ovary which appeared grossly normal and postmenopausal. Due to this the efforts to remove the left tube and ovary were discontinued. Placed the abdomen followed removal of the specimen through a suprapubic site using Endo Catch bag. Saline was left in the abdomen one 25 cc estimate. The trochars were removed, the fascia closed in the umbilical and suprapubic site and all 4 sites closed subcuticular at the level of the skin.  A skin tag removal. Face and neck were prepped on both sides on the right side and approximately 75 (seventy-five) skin tags varying from 2 mm to 9 mm were removed from the neck and face. A few of them required point cautery using needle tip cautery under very low coagulation, primarily on the neck,, but most responded to Steri-Strip placement or simply local pressure. Procedure was successfully completed and patient went  to recovery room in good condition sponge and needle counts correct pain control was good so she will be discharged later today   PLAN OF CARE:  Discharge to home after PACU  PATIENT DISPOSITION:  PACU - hemodynamically stable.   Delay start of Pharmacological VTE agent (>24hrs) due to surgical blood loss or risk of bleeding: not applicable

## 2012-04-22 NOTE — Transfer of Care (Signed)
Immediate Anesthesia Transfer of Care Note  Patient: Krista Strickland  Procedure(s) Performed: Procedure(s) (LRB): LAPAROSCOPIC SALPINGO OOPHORECTOMY (Right) EXCISION SKIN TAGS NECK AND HEAD (N/A)  Patient Location: PACU  Anesthesia Type: General  Level of Consciousness: awake  Airway & Oxygen Therapy: Patient Spontanous Breathing and non-rebreather face mask  Post-op Assessment: Report given to PACU RN, Post -op Vital signs reviewed and stable and Patient moving all extremities  Post vital signs: Reviewed and stable  Complications: No apparent anesthesia complications

## 2012-04-22 NOTE — Op Note (Signed)
See operative details included in the brief op note

## 2012-04-25 ENCOUNTER — Encounter (HOSPITAL_COMMUNITY): Payer: Self-pay | Admitting: Obstetrics and Gynecology

## 2012-04-27 ENCOUNTER — Other Ambulatory Visit: Payer: Self-pay | Admitting: Family Medicine

## 2012-05-06 ENCOUNTER — Encounter: Payer: Self-pay | Admitting: *Deleted

## 2012-05-07 ENCOUNTER — Encounter: Payer: Self-pay | Admitting: Obstetrics and Gynecology

## 2012-05-07 ENCOUNTER — Ambulatory Visit (INDEPENDENT_AMBULATORY_CARE_PROVIDER_SITE_OTHER): Payer: Medicare PPO | Admitting: Obstetrics and Gynecology

## 2012-05-07 VITALS — BP 142/50 | Wt 220.4 lb

## 2012-05-07 DIAGNOSIS — R109 Unspecified abdominal pain: Secondary | ICD-10-CM

## 2012-05-07 DIAGNOSIS — N7011 Chronic salpingitis: Secondary | ICD-10-CM

## 2012-05-07 DIAGNOSIS — N7013 Chronic salpingitis and oophoritis: Secondary | ICD-10-CM

## 2012-05-07 NOTE — Progress Notes (Deleted)
  Subjective:    Patient ID: Krista Strickland, female    DOB: Dec 12, 1950, 62 y.o.   MRN: [id number]  HPI    Review of Systems     Objective:   Physical Exam        Assessment & Plan:   Subjective:     Krista Strickland is a 62 y.o. female who presents to the clinic {1-10:13787} weeks status post {gyn surgeries:13997} for {gyn surg indications maj:13998}. Eating a regular diet {with-without:5700} difficulty. Bowel movements are {normal/abnormal***:19619}. {pain control:13522::"The patient is not having any pain."}  {Common ambulatory SmartLinks:19316}  Review of Systems {ros; complete:30496}    Objective:    BP 142/50  Wt 220 lb 6.4 oz (99.973 kg)  BMI 35.59 kg/m2 General:  {gen appearance:16600}  Abdomen: {post op abd exam:13497::"soft","bowel sounds active","non-tender"}  Incision:   {incision:13716::"no dehiscence","incision well approximated","healing well","no drainage","no erythema","no hernia","no seroma","no swelling"}     Assessment:    {doing well:13525::"Doing well postoperatively."} Operative findings again reviewed. Pathology report discussed.    Plan:    1. Continue any current medications. 2. Wound care discussed. 3. Activity restrictions: {restrictions:13723} 4. Anticipated return to work: {work return:14002}. 5. Follow up: {1-10:13787} {time; units:18646} for suture removal.

## 2012-05-07 NOTE — Patient Instructions (Signed)
Resume normal activites  May be sexually active followup if pains recur.

## 2012-05-07 NOTE — Progress Notes (Signed)
Postop check after lap salpingoophorectomy. Pain dramatically better. Incisions clean. Suprapubic slowest to heal, but wnl Face: and neck  Well healed x 75 sites Bimanual: slight fullness at apex, mild discomfort, dramatically better  A: normal postop check s/p lap s&o  p  Resume normal activity      Resume sex     F/u prn.

## 2012-06-29 ENCOUNTER — Other Ambulatory Visit: Payer: Self-pay | Admitting: Family Medicine

## 2012-07-09 ENCOUNTER — Ambulatory Visit (INDEPENDENT_AMBULATORY_CARE_PROVIDER_SITE_OTHER): Payer: Medicare PPO | Admitting: Family Medicine

## 2012-07-09 ENCOUNTER — Encounter: Payer: Self-pay | Admitting: Family Medicine

## 2012-07-09 VITALS — BP 130/70 | HR 68 | Resp 16 | Ht 63.0 in | Wt 224.0 lb

## 2012-07-09 DIAGNOSIS — Z0000 Encounter for general adult medical examination without abnormal findings: Secondary | ICD-10-CM

## 2012-07-09 DIAGNOSIS — E119 Type 2 diabetes mellitus without complications: Secondary | ICD-10-CM

## 2012-07-09 MED ORDER — DILTIAZEM HCL ER 180 MG PO CP24: 180.0000 mg | ORAL_CAPSULE | Freq: Every day | ORAL | Status: DC

## 2012-07-09 MED ORDER — METOPROLOL TARTRATE 100 MG PO TABS: ORAL_TABLET | ORAL | Status: DC

## 2012-07-09 NOTE — Patient Instructions (Addendum)
F/u in  4.5 month  Call if you need me before  You are referred to Dr Iona Hansen for eye exam , we will call wwih appointment  It is important that you exercise regularly at least 30 minutes 5 times a week. If you develop chest pain, have severe difficulty breathing, or feel very tired, stop exercising immediately and seek medical attention    A healthy diet is rich in fruit, vegetables and whole grains. Poultry fish, nuts and beans are a healthy choice for protein rather then red meat. A low sodium diet and drinking 64 ounces of water daily is generally recommended. Oils and sweet should be limited. Carbohydrates especially for those who are diabetic or overweight, should be limited to 30-45 gram per meal. It is important to eat on a regular schedule, at least 3 times daily. Snacks should be primarily fruits, vegetables or nuts.

## 2012-07-09 NOTE — Progress Notes (Signed)
Subjective:    Patient ID: Krista Strickland, female    DOB: 06-Apr-1950, 62 y.o.   MRN: [id number]  HPI Preventive Screening-Counseling & Management   Patient present here today for a Medicare annual wellness visit.   Current Problems (verified)   Medications Prior to Visit Allergies (verified)   PAST HISTORY  Family History: 7 sibs living no CAd, CVA , 2 brothers have prostae cancer. No depression or dementia Mom died a t 3, father age 54  Social History  Married mom of son age 19, 2 grand kids. No tobacco , alcohol or street drugs Disabled since 26  Risk Factors  Current exercise habits: walk daily for 1 hour daily   Dietary issues discussed:low sugar, low fat, rich i veg and fruit   Cardiac risk factors:   Depression Screen  (Note: if answer to either of the following is "Yes", a more complete depression screening is indicated)   Over the past two weeks, have you felt down, depressed or hopeless? No  Over the past two weeks, have you felt little interest or pleasure in doing things? No  Have you lost interest or pleasure in daily life? No  Do you often feel hopeless? No  Do you cry easily over simple problems? No   Activities of Daily Living  In your present state of health, do you have any difficulty performing the following activities?  Driving?: No Managing money?: No Feeding yourself?:No Getting from bed to chair?:No Climbing a flight of stairs?:No Preparing food and eating?:No Bathing or showering?:No Getting dressed?:No Getting to the toilet?:No Using the toilet?:No Moving around from place to place?: No  Fall Risk Assessment In the past year have you fallen or had a near fall?:No Are you currently taking any medications that make you dizziness?:No   Hearing Difficulties: No Do you often ask people to speak up or repeat themselves?:No Do you experience ringing or noises in your ears?:No Do you have difficulty understanding soft or whispered  voices?:No  Cognitive Testing  Alert? Yes Normal Appearance?Yes  Oriented to person? Yes Place? Yes  Time? Yes  Displays appropriate judgment?Yes  Can read the correct time from a watch face? yes Are you having problems remembering things?No  Advanced Directives have been discussed with the patient?Yes , full code   List the Names of Other Physician/Practitioners you currently use: dr Dorris Fetch, Dr Vedia Pereyra   Indicate any recent Medical Services you may have received from other than [facility] providers in the past year (date may be approximate).   Assessment:    Annual Wellness Exam   Plan:    During the course of the visit the patient was educated and counseled about appropriate screening and preventive services including:  A healthy diet is rich in fruit, vegetables and whole grains. Poultry fish, nuts and beans are a healthy choice for protein rather then red meat. A low sodium diet and drinking 64 ounces of water daily is generally recommended. Oils and sweet should be limited. Carbohydrates especially for those who are diabetic or overweight, should be limited to 30-45 gram per meal. It is important to eat on a regular schedule, at least 3 times daily. Snacks should be primarily fruits, vegetables or nuts. It is important that you exercise regularly at least 30 minutes 5 times a week. If you develop chest pain, have severe difficulty breathing, or feel very tired, stop exercising immediately and seek medical attention  Immunization reviewed and updated. Cancer screening reviewed and updated  Patient Instructions (the written plan) was given to the patient.  Medicare Attestation  I have personally reviewed:  The patient's medical and social history  Their use of alcohol, tobacco or illicit drugs  Their current medications and supplements  The patient's functional ability including ADLs,fall risks, home safety risks, cognitive, and hearing and visual impairment  Diet and physical  activities  Evidence for depression or mood disorders  The patient's weight, height, BMI, and visual acuity have been recorded in the chart. I have made referrals, counseling, and provided education to the patient based on review of the above and I have provided the patient with a written personalized care plan for preventive services.      Review of Systems     Objective:   Physical Exam        Assessment & Plan:

## 2012-07-20 ENCOUNTER — Encounter: Payer: Self-pay | Admitting: Family Medicine

## 2012-07-20 NOTE — Assessment & Plan Note (Signed)
annual wellness as documented Pt fully functional with no limitations She is motivated to improving her health by lifestyle change

## 2012-09-09 ENCOUNTER — Other Ambulatory Visit: Payer: Self-pay

## 2012-09-09 MED ORDER — PRAVASTATIN SODIUM 20 MG PO TABS: ORAL_TABLET | ORAL | Status: DC

## 2012-10-01 ENCOUNTER — Other Ambulatory Visit: Payer: Self-pay

## 2012-10-01 MED ORDER — METOLAZONE 5 MG PO TABS: ORAL_TABLET | ORAL | Status: DC

## 2012-10-28 ENCOUNTER — Ambulatory Visit (INDEPENDENT_AMBULATORY_CARE_PROVIDER_SITE_OTHER): Payer: Medicare PPO

## 2012-10-28 DIAGNOSIS — Z23 Encounter for immunization: Secondary | ICD-10-CM

## 2012-11-04 ENCOUNTER — Other Ambulatory Visit: Payer: Self-pay | Admitting: Family Medicine

## 2012-11-04 DIAGNOSIS — Z139 Encounter for screening, unspecified: Secondary | ICD-10-CM

## 2012-11-24 ENCOUNTER — Ambulatory Visit (HOSPITAL_COMMUNITY): Payer: Medicare PPO

## 2012-12-01 ENCOUNTER — Encounter: Payer: Self-pay | Admitting: Family Medicine

## 2012-12-01 ENCOUNTER — Encounter (INDEPENDENT_AMBULATORY_CARE_PROVIDER_SITE_OTHER): Payer: Self-pay

## 2012-12-01 ENCOUNTER — Ambulatory Visit (INDEPENDENT_AMBULATORY_CARE_PROVIDER_SITE_OTHER): Payer: Medicare PPO | Admitting: Family Medicine

## 2012-12-01 VITALS — BP 122/74 | HR 84 | Resp 16 | Wt 221.0 lb

## 2012-12-01 DIAGNOSIS — N058 Unspecified nephritic syndrome with other morphologic changes: Secondary | ICD-10-CM

## 2012-12-01 DIAGNOSIS — E1129 Type 2 diabetes mellitus with other diabetic kidney complication: Secondary | ICD-10-CM

## 2012-12-01 DIAGNOSIS — E785 Hyperlipidemia, unspecified: Secondary | ICD-10-CM

## 2012-12-01 DIAGNOSIS — I6350 Cerebral infarction due to unspecified occlusion or stenosis of unspecified cerebral artery: Secondary | ICD-10-CM

## 2012-12-01 DIAGNOSIS — E1169 Type 2 diabetes mellitus with other specified complication: Secondary | ICD-10-CM

## 2012-12-01 DIAGNOSIS — E669 Obesity, unspecified: Secondary | ICD-10-CM

## 2012-12-01 DIAGNOSIS — E11628 Type 2 diabetes mellitus with other skin complications: Secondary | ICD-10-CM

## 2012-12-01 DIAGNOSIS — E1121 Type 2 diabetes mellitus with diabetic nephropathy: Secondary | ICD-10-CM

## 2012-12-01 DIAGNOSIS — L84 Corns and callosities: Secondary | ICD-10-CM

## 2012-12-01 DIAGNOSIS — I639 Cerebral infarction, unspecified: Secondary | ICD-10-CM

## 2012-12-01 NOTE — Progress Notes (Signed)
  Subjective:    Patient ID: Krista Strickland, female    DOB: [birth year], 62 y.o.   MRN: [id number]  HPI The PT is here for follow up and re-evaluation of chronic medical conditions, medication management and review of any available recent lab and radiology data.  Preventive health is updated, specifically  Cancer screening and Immunization.   Questions or concerns regarding consultations or procedures which the PT has had in the interim are  Addressed.Follows regularly with nephrology as well as neurology and orthopedics The PT denies any adverse reactions to current medications since the last visit.  There are no new concerns.  There are no specific complaints       Review of Systems See HPI Denies recent fever or chills. Denies sinus pressure, nasal congestion, ear pain or sore throat. Denies chest congestion, productive cough or wheezing. Denies chest pains, palpitations and leg swelling Denies abdominal pain, nausea, vomiting,diarrhea or constipation.   Denies dysuria, frequency, hesitancy or incontinence. Chronic joint pain, swelling and minor  limitation in mobility. Denies headaches, seizures, numbness, or tingling. Denies depression, anxiety or insomnia. Denies skin break down or rash.        Objective:   Physical Exam  Patient alert and oriented and in no cardiopulmonary distress.  HEENT: No facial asymmetry, EOMI, no sinus tenderness,  oropharynx pink and moist.  Neck supple no adenopathy.  Chest: Clear to auscultation bilaterally.  CVS: S1, S2 no murmurs, no S3.  ABD: Soft non tender. Bowel sounds normal.  Ext: No edema  MS: Adequate ROM spine, shoulders, hips and knees.  Skin: Intact, no ulcerations or rash noted.  Psych: Good eye contact, normal affect. Memory intact not anxious or depressed appearing.  CNS: CN 2-12 intact, power, tone and sensation normal throughout.       Assessment & Plan:

## 2012-12-01 NOTE — Patient Instructions (Signed)
F/u in 3.5 month, call if you need me before  PLEASE call your insurance company, ask if they will cover tetanus and shingles vaccines, you need both, call [facility] if covered, you will be able to come in just for the vaccines.  Based on foot exam today, you do qualify for diabetic shoes and you will get a script, check at [facility] will contact you re labs needed, this week , once we have updated labs from Dr Liliane Channel office

## 2012-12-02 ENCOUNTER — Other Ambulatory Visit: Payer: Self-pay

## 2012-12-02 LAB — MICROALBUMIN / CREATININE URINE RATIO: Microalb, Ur: 1.48 mg/dL (ref 0.00–1.89)

## 2012-12-02 MED ORDER — FUROSEMIDE 40 MG PO TABS: 40.0000 mg | ORAL_TABLET | Freq: Two times a day (BID) | ORAL | Status: DC

## 2012-12-07 ENCOUNTER — Telehealth: Payer: Self-pay | Admitting: Family Medicine

## 2012-12-07 DIAGNOSIS — R5383 Other fatigue: Secondary | ICD-10-CM

## 2012-12-07 DIAGNOSIS — E1165 Type 2 diabetes mellitus with hyperglycemia: Secondary | ICD-10-CM | POA: Insufficient documentation

## 2012-12-07 DIAGNOSIS — E785 Hyperlipidemia, unspecified: Secondary | ICD-10-CM

## 2012-12-07 DIAGNOSIS — I10 Essential (primary) hypertension: Secondary | ICD-10-CM

## 2012-12-07 DIAGNOSIS — R5381 Other malaise: Secondary | ICD-10-CM

## 2012-12-07 DIAGNOSIS — E1121 Type 2 diabetes mellitus with diabetic nephropathy: Secondary | ICD-10-CM

## 2012-12-07 DIAGNOSIS — E559 Vitamin D deficiency, unspecified: Secondary | ICD-10-CM

## 2012-12-07 DIAGNOSIS — M109 Gout, unspecified: Secondary | ICD-10-CM

## 2012-12-07 NOTE — Telephone Encounter (Signed)
Please call pt and let her know that I was able to review labs. For her follow up next year in 3.5 month, she needs fasting lipid , cmp and EGFR, vit D , uric acid and cbc, please order and mail to her.

## 2012-12-07 NOTE — Assessment & Plan Note (Signed)
Deteriorated. Patient re-educated about  the importance of commitment to a  minimum of 150 minutes of exercise per week. The importance of healthy food choices with portion control discussed. Encouraged to start a food diary, count calories and to consider  joining a support group. Sample diet sheets offered. Goals set by the patient for the next several months.    

## 2012-12-07 NOTE — Assessment & Plan Note (Signed)
Lipid 11/11/2012, total cholesterol 107, LDL 31, HDL 27, tG 243, risk ratio 4.9 (average)

## 2012-12-07 NOTE — Assessment & Plan Note (Signed)
Followed by neurology.   

## 2012-12-07 NOTE — Assessment & Plan Note (Signed)
Needs diabetic shoes.  °

## 2012-12-10 NOTE — Addendum Note (Signed)
Addended by: Eual Fines on: 12/10/2012 01:38 PM   Modules accepted: Orders

## 2012-12-10 NOTE — Telephone Encounter (Signed)
Called patient and left message for them to return call at the office   

## 2012-12-11 NOTE — Telephone Encounter (Signed)
Patient aware and faxed to the lab

## 2012-12-31 ENCOUNTER — Encounter: Payer: Self-pay | Admitting: Family Medicine

## 2013-01-05 ENCOUNTER — Ambulatory Visit (HOSPITAL_COMMUNITY)
Admission: RE | Admit: 2013-01-05 | Discharge: 2013-01-05 | Disposition: A | Discharge status: Home / Self Care | Payer: Medicare PPO | Source: Ambulatory Visit | Attending: Family Medicine | Admitting: Family Medicine

## 2013-01-05 DIAGNOSIS — Z1231 Encounter for screening mammogram for malignant neoplasm of breast: Secondary | ICD-10-CM | POA: Insufficient documentation

## 2013-01-05 DIAGNOSIS — Z139 Encounter for screening, unspecified: Secondary | ICD-10-CM

## 2013-01-24 ENCOUNTER — Other Ambulatory Visit: Payer: Self-pay | Admitting: Family Medicine

## 2013-03-02 ENCOUNTER — Other Ambulatory Visit: Payer: Self-pay | Admitting: Family Medicine

## 2013-03-20 LAB — CBC WITH DIFFERENTIAL/PLATELET
Basophils Absolute: 0.1 10*3/uL (ref 0.0–0.1)
Basophils Relative: 1 % (ref 0–1)
Eosinophils Absolute: 0.2 10*3/uL (ref 0.0–0.7)
Eosinophils Relative: 3 % (ref 0–5)
HCT: 37.6 % (ref 36.0–46.0)
Hemoglobin: 12.6 g/dL (ref 12.0–15.0)
Lymphocytes Relative: 32 % (ref 12–46)
Lymphs Abs: 2.2 10*3/uL (ref 0.7–4.0)
MCH: 29.6 pg (ref 26.0–34.0)
MCHC: 33.5 g/dL (ref 30.0–36.0)
MCV: 88.5 fL (ref 78.0–100.0)
Monocytes Absolute: 0.8 10*3/uL (ref 0.1–1.0)
Monocytes Relative: 11 % (ref 3–12)
Neutro Abs: 3.8 10*3/uL (ref 1.7–7.7)
Neutrophils Relative %: 53 % (ref 43–77)
Platelets: 234 10*3/uL (ref 150–400)
RBC: 4.25 MIL/uL (ref 3.87–5.11)
RDW: 15.1 % (ref 11.5–15.5)
WBC: 7 10*3/uL (ref 4.0–10.5)

## 2013-03-20 LAB — LIPID PANEL
Cholesterol: 110 mg/dL (ref 0–200)
HDL: 28 mg/dL — ABNORMAL LOW (ref >39)
LDL Cholesterol: 26 mg/dL (ref 0–99)
Total CHOL/HDL Ratio: 3.9 Ratio
Triglycerides: 281 mg/dL — ABNORMAL HIGH (ref <150)
VLDL: 56 mg/dL — ABNORMAL HIGH (ref 0–40)

## 2013-03-20 LAB — COMPLETE METABOLIC PANEL WITH GFR
ALT: 25 U/L (ref 0–35)
AST: 21 U/L (ref 0–37)
Albumin: 4 g/dL (ref 3.5–5.2)
Alkaline Phosphatase: 68 U/L (ref 39–117)
BUN: 44 mg/dL — ABNORMAL HIGH (ref 6–23)
CO2: 28 mEq/L (ref 19–32)
Calcium: 9.3 mg/dL (ref 8.4–10.5)
Chloride: 101 mEq/L (ref 96–112)
Creat: 1.64 mg/dL — ABNORMAL HIGH (ref 0.50–1.10)
GFR, Est African American: 38 mL/min — ABNORMAL LOW
GFR, Est Non African American: 33 mL/min — ABNORMAL LOW
Glucose, Bld: 156 mg/dL — ABNORMAL HIGH (ref 70–99)
Potassium: 3.5 mEq/L (ref 3.5–5.3)
Sodium: 139 mEq/L (ref 135–145)
Total Bilirubin: 0.3 mg/dL (ref 0.2–1.2)
Total Protein: 6.7 g/dL (ref 6.0–8.3)

## 2013-03-20 LAB — URIC ACID: Uric Acid, Serum: 7.1 mg/dL — ABNORMAL HIGH (ref 2.4–7.0)

## 2013-03-21 LAB — VITAMIN D 25 HYDROXY (VIT D DEFICIENCY, FRACTURES): Vit D, 25-Hydroxy: 53 ng/mL (ref 30–89)

## 2013-03-24 ENCOUNTER — Ambulatory Visit: Payer: Medicare PPO | Admitting: Family Medicine

## 2013-04-15 ENCOUNTER — Emergency Department (HOSPITAL_COMMUNITY)
Admission: EM | Admit: 2013-04-15 | Discharge: 2013-04-16 | Disposition: A | Discharge status: Home / Self Care | Payer: Medicare Other | Attending: Emergency Medicine | Admitting: Emergency Medicine

## 2013-04-15 ENCOUNTER — Encounter (HOSPITAL_COMMUNITY): Payer: Self-pay | Admitting: Emergency Medicine

## 2013-04-15 DIAGNOSIS — M545 Low back pain, unspecified: Secondary | ICD-10-CM | POA: Insufficient documentation

## 2013-04-15 DIAGNOSIS — Z79899 Other long term (current) drug therapy: Secondary | ICD-10-CM | POA: Insufficient documentation

## 2013-04-15 DIAGNOSIS — Z794 Long term (current) use of insulin: Secondary | ICD-10-CM | POA: Insufficient documentation

## 2013-04-15 DIAGNOSIS — I2510 Atherosclerotic heart disease of native coronary artery without angina pectoris: Secondary | ICD-10-CM | POA: Insufficient documentation

## 2013-04-15 DIAGNOSIS — K219 Gastro-esophageal reflux disease without esophagitis: Secondary | ICD-10-CM | POA: Insufficient documentation

## 2013-04-15 DIAGNOSIS — I129 Hypertensive chronic kidney disease with stage 1 through stage 4 chronic kidney disease, or unspecified chronic kidney disease: Secondary | ICD-10-CM | POA: Insufficient documentation

## 2013-04-15 DIAGNOSIS — Z87891 Personal history of nicotine dependence: Secondary | ICD-10-CM | POA: Insufficient documentation

## 2013-04-15 DIAGNOSIS — M79609 Pain in unspecified limb: Secondary | ICD-10-CM | POA: Insufficient documentation

## 2013-04-15 DIAGNOSIS — N189 Chronic kidney disease, unspecified: Secondary | ICD-10-CM | POA: Insufficient documentation

## 2013-04-15 DIAGNOSIS — E119 Type 2 diabetes mellitus without complications: Secondary | ICD-10-CM | POA: Insufficient documentation

## 2013-04-15 DIAGNOSIS — E669 Obesity, unspecified: Secondary | ICD-10-CM | POA: Insufficient documentation

## 2013-04-15 DIAGNOSIS — M109 Gout, unspecified: Secondary | ICD-10-CM | POA: Insufficient documentation

## 2013-04-15 DIAGNOSIS — Z8673 Personal history of transient ischemic attack (TIA), and cerebral infarction without residual deficits: Secondary | ICD-10-CM | POA: Insufficient documentation

## 2013-04-15 DIAGNOSIS — Z7982 Long term (current) use of aspirin: Secondary | ICD-10-CM | POA: Insufficient documentation

## 2013-04-15 DIAGNOSIS — Z8614 Personal history of Methicillin resistant Staphylococcus aureus infection: Secondary | ICD-10-CM | POA: Insufficient documentation

## 2013-04-15 DIAGNOSIS — Z880 Allergy status to penicillin: Secondary | ICD-10-CM | POA: Insufficient documentation

## 2013-04-15 NOTE — ED Notes (Signed)
Pt c/o lower back pain since 1700, she denies any injury.

## 2013-04-16 ENCOUNTER — Encounter: Payer: Self-pay | Admitting: Family Medicine

## 2013-04-16 ENCOUNTER — Emergency Department (HOSPITAL_COMMUNITY): Payer: Medicare Other

## 2013-04-16 ENCOUNTER — Ambulatory Visit (INDEPENDENT_AMBULATORY_CARE_PROVIDER_SITE_OTHER): Payer: Medicare Other | Admitting: Family Medicine

## 2013-04-16 VITALS — BP 138/64 | HR 70 | Resp 18 | Ht 63.0 in | Wt 226.1 lb

## 2013-04-16 DIAGNOSIS — M5432 Sciatica, left side: Secondary | ICD-10-CM

## 2013-04-16 DIAGNOSIS — E785 Hyperlipidemia, unspecified: Secondary | ICD-10-CM

## 2013-04-16 DIAGNOSIS — E669 Obesity, unspecified: Secondary | ICD-10-CM

## 2013-04-16 DIAGNOSIS — I10 Essential (primary) hypertension: Secondary | ICD-10-CM

## 2013-04-16 DIAGNOSIS — N058 Unspecified nephritic syndrome with other morphologic changes: Secondary | ICD-10-CM

## 2013-04-16 DIAGNOSIS — E1129 Type 2 diabetes mellitus with other diabetic kidney complication: Secondary | ICD-10-CM

## 2013-04-16 DIAGNOSIS — E1169 Type 2 diabetes mellitus with other specified complication: Secondary | ICD-10-CM

## 2013-04-16 DIAGNOSIS — M549 Dorsalgia, unspecified: Secondary | ICD-10-CM | POA: Insufficient documentation

## 2013-04-16 DIAGNOSIS — L84 Corns and callosities: Secondary | ICD-10-CM

## 2013-04-16 DIAGNOSIS — E11628 Type 2 diabetes mellitus with other skin complications: Secondary | ICD-10-CM

## 2013-04-16 DIAGNOSIS — E1121 Type 2 diabetes mellitus with diabetic nephropathy: Secondary | ICD-10-CM

## 2013-04-16 DIAGNOSIS — M109 Gout, unspecified: Secondary | ICD-10-CM

## 2013-04-16 DIAGNOSIS — M5430 Sciatica, unspecified side: Secondary | ICD-10-CM

## 2013-04-16 MED ORDER — ONDANSETRON 4 MG PO TBDP
4.0000 mg | ORAL_TABLET | Freq: Once | ORAL | Status: AC
  Administered 2013-04-16: 4 mg via ORAL
  Filled 2013-04-16: qty 1

## 2013-04-16 MED ORDER — CYCLOBENZAPRINE HCL 10 MG PO TABS
10.0000 mg | ORAL_TABLET | Freq: Once | ORAL | Status: AC
  Administered 2013-04-16: 10 mg via ORAL
  Filled 2013-04-16: qty 1

## 2013-04-16 MED ORDER — PREDNISONE 5 MG PO TABS: 5.0000 mg | ORAL_TABLET | Freq: Two times a day (BID) | ORAL | Status: AC

## 2013-04-16 MED ORDER — HYDROMORPHONE HCL PF 1 MG/ML IJ SOLN
1.0000 mg | Freq: Once | INTRAMUSCULAR | Status: AC
  Administered 2013-04-16: 1 mg via INTRAMUSCULAR
  Filled 2013-04-16: qty 1

## 2013-04-16 MED ORDER — EZETIMIBE 10 MG PO TABS: 10.0000 mg | ORAL_TABLET | Freq: Every day | ORAL | Status: DC

## 2013-04-16 NOTE — Discharge Instructions (Signed)
Back Pain, Adult Low back pain is very common. About 1 in 5 people have back pain.The cause of low back pain is rarely dangerous. The pain often gets better over time.About half of people with a sudden onset of back pain feel better in just 2 weeks. About 8 in 10 people feel better by 6 weeks.  CAUSES Some common causes of back pain include:  Strain of the muscles or ligaments supporting the spine.  Wear and tear (degeneration) of the spinal discs.  Arthritis.  Direct injury to the back. DIAGNOSIS Most of the time, the direct cause of low back pain is not known.However, back pain can be treated effectively even when the exact cause of the pain is unknown.Answering your caregiver's questions about your overall health and symptoms is one of the most accurate ways to make sure the cause of your pain is not dangerous. If your caregiver needs more information, he or she may order lab work or imaging tests (X-rays or MRIs).However, even if imaging tests show changes in your back, this usually does not require surgery. HOME CARE INSTRUCTIONS For many people, back pain returns.Since low back pain is rarely dangerous, it is often a condition that people can learn to manageon their own.   Remain active. It is stressful on the back to sit or stand in one place. Do not sit, drive, or stand in one place for more than 30 minutes at a time. Take short walks on level surfaces as soon as pain allows.Try to increase the length of time you walk each day.  Do not stay in bed.Resting more than 1 or 2 days can delay your recovery.  Do not avoid exercise or work.Your body is made to move.It is not dangerous to be active, even though your back may hurt.Your back will likely heal faster if you return to being active before your pain is gone.  Pay attention to your body when you bend and lift. Many people have less discomfortwhen lifting if they bend their knees, keep the load close to their bodies,and  avoid twisting. Often, the most comfortable positions are those that put less stress on your recovering back.  Find a comfortable position to sleep. Use a firm mattress and lie on your side with your knees slightly bent. If you lie on your back, put a pillow under your knees.  Only take over-the-counter or prescription medicines as directed by your caregiver. Over-the-counter medicines to reduce pain and inflammation are often the most helpful.Your caregiver may prescribe muscle relaxant drugs.These medicines help dull your pain so you can more quickly return to your normal activities and healthy exercise.  Put ice on the injured area.  Put ice in a plastic bag.  Place a towel between your skin and the bag.  Leave the ice on for 15-20 minutes, 03-04 times a day for the first 2 to 3 days. After that, ice and heat may be alternated to reduce pain and spasms.  Ask your caregiver about trying back exercises and gentle massage. This may be of some benefit.  Avoid feeling anxious or stressed.Stress increases muscle tension and can worsen back pain.It is important to recognize when you are anxious or stressed and learn ways to manage it.Exercise is a great option. SEEK MEDICAL CARE IF:  You have pain that is not relieved with rest or medicine.  You have pain that does not improve in 1 week.  You have new symptoms.  You are generally not feeling well. SEEK   IMMEDIATE MEDICAL CARE IF:   You have pain that radiates from your back into your legs.  You develop new bowel or bladder control problems.  You have unusual weakness or numbness in your arms or legs.  You develop nausea or vomiting.  You develop abdominal pain.  You feel faint. Document Released: 01/15/2005 Document Revised: 07/17/2011 Document Reviewed: 06/05/2010 [employer] Patient Information 2014 [employer].  

## 2013-04-16 NOTE — ED Notes (Signed)
Pt reporting back pain began about 5 pm this evening.  Reports pain in lower back, radiating into left hip and into left leg.  Denies injury.

## 2013-04-16 NOTE — Patient Instructions (Addendum)
F/U in 4 month, call if you need me before  It is important to call your insurance company you NEED tetanus vaccine and also the shingles vaccine, call us if they will cover them  New additional medication for cholesterol is zetia ,also 5 day course of prednisone is prescribed for your back pain  Fasting lipid, cmp and EGFR in 4 month, before follow up

## 2013-04-16 NOTE — ED Provider Notes (Signed)
CSN: [id number]     Arrival date & time 04/15/13  2246 History  This chart was scribed for Teressa Lower, MD by Maree Erie, ED Scribe. The patient was seen in room APA05/APA05. Patient's care was started at 12:33 AM.    Chief Complaint  Patient presents with  . Back Pain    Patient is a 63 y.o. female presenting with back pain. The history is provided by the patient. No language interpreter was used.  Back Pain Location:  Lumbar spine Radiates to:  L thigh, L posterior upper leg and L knee Onset quality:  Gradual Duration:  7 hours Timing:  Constant Chronicity:  New Worsened by:  Movement Ineffective treatments: hydrocodone. Associated symptoms: no bladder incontinence, no bowel incontinence, no fever, no numbness, no perianal numbness, no tingling and no weakness     HPI Comments: Krista Strickland is a 63 y.o. female who presents to the Emergency Department complaining of constant lower back pain that began seven hours ago. The pain radiates down her left leg. She describes the pain as "spasming" and rates it as a 9/10 in severity. The pain is worsened by walking and movement. She reports taking Hydrocodone at home without relief. She denies recent lifting, bending, twisting, injury or trauma. She denies similar prior pain. She denies numbness or weakness in extremities, bowel or bladder incontinence, fever, cough or congestion.   PCP is Dr. Tula Nakayama.  Past Medical History  Diagnosis Date  . Hyperlipidemia   . Obesity   . Diabetes mellitus, type 2   . Hypertension   . History of MRSA infection 03/2009    on ant abdomen   . Chronic kidney disease   . Gout   . Psoriasis   . Stroke   . Coronary artery disease   . GERD (gastroesophageal reflux disease)    Past Surgical History  Procedure Laterality Date  . Partial hysterectomy    . Rt,. neck biopsy    . Excison of rt breast cyst    . Appendectomy  2012  . Laparoscopic salpingo oopherectomy Right 04/22/2012   Procedure: LAPAROSCOPIC SALPINGO OOPHORECTOMY;  Surgeon: Jonnie Kind, MD;  Location: [facility];  Service: Gynecology;  Laterality: Right;  end 11:17  . Mass excision N/A 04/22/2012    Procedure: EXCISION SKIN TAGS NECK AND HEAD;  Surgeon: Jonnie Kind, MD;  Location: [facility];  Service: Gynecology;  Laterality: N/A;  start 11:19   Family History  Problem Relation Age of Onset  . Gout Sister   . Hypertension Sister   . Stroke Sister   . Gout Brother   . Gout Brother   . Gout Brother   . Hypertension Brother   . Hypertension Brother    History  Substance Use Topics  . Smoking status: Former Research scientist (life sciences)  . Smokeless tobacco: Not on file  . Alcohol Use: No   OB History   Grav Para Term Preterm Abortions TAB SAB Ect Mult Living   1 1             Review of Systems  Constitutional: Negative for fever.  HENT: Negative for congestion.   Respiratory: Negative for cough.   Gastrointestinal: Negative for bowel incontinence.  Genitourinary: Negative for bladder incontinence.  Musculoskeletal: Positive for back pain.  Neurological: Negative for tingling, weakness and numbness.  All other systems reviewed and are negative.      Allergies  Benazepril; Fish allergy; Metronidazole; Mobic; Penicillins; and Sulfonamide derivatives  Home Medications  Current Outpatient Rx  Name  Route  Sig  Dispense  Refill  . allopurinol (ZYLOPRIM) 300 MG tablet   Oral   Take 300 mg by mouth daily.           Marland Kitchen aspirin 325 MG tablet   Oral   Take 325 mg by mouth daily.         . calcitRIOL (ROCALTROL) 0.25 MCG capsule   Oral   Take 0.25 mcg by mouth every other day.          . cyclobenzaprine (FLEXERIL) 10 MG tablet   Oral   Take 10 mg by mouth 3 (three) times daily as needed. Muscle Spasms         . diltiazem (DILACOR XR) 180 MG 24 hr capsule   Oral   Take 1 capsule (180 mg total) by mouth daily.   30 capsule   5   . diltiazem (DILACOR XR) 180 MG 24 hr capsule      TAKE ONE  CAPSULE BY MOUTH ONCE DAILY   30 capsule   1   . docusate sodium (COLACE) 100 MG capsule   Oral   Take 100 mg by mouth daily as needed. Constipation         . exenatide (BYETTA 10 MCG PEN) 10 MCG/0.04ML SOLN   Subcutaneous   Inject 5 mcg into the skin 2 (two) times daily with a meal.    1.2 mL      . furosemide (LASIX) 40 MG tablet   Oral   Take 1 tablet (40 mg total) by mouth 2 (two) times daily.   60 tablet   3   . HYDROcodone-acetaminophen (NORCO) 7.5-325 MG per tablet   Oral   Take 1 tablet by mouth every 6 (six) hours as needed. Pain         . insulin glargine (LANTUS SOLOSTAR) 100 UNIT/ML injection   Subcutaneous   Inject 40 Units into the skin at bedtime.          Marland Kitchen lisinopril (PRINIVIL,ZESTRIL) 5 MG tablet   Oral   Take 5 mg by mouth daily.         . metolazone (ZAROXOLYN) 5 MG tablet      TAKE ONE TABLET BY MOUTH ONCE DAILY   30 tablet   2   . metoprolol (LOPRESSOR) 100 MG tablet      TAKE ONE TABLET BY MOUTH TWICE DAILY   60 tablet   5   . metoprolol (LOPRESSOR) 100 MG tablet      TAKE ONE TABLET BY MOUTH TWICE DAILY   60 tablet   1   . midodrine (PROAMATINE) 5 MG tablet   Oral   Take 5 mg by mouth 2 (two) times daily.         . potassium chloride (K-DUR,KLOR-CON) 10 MEQ tablet   Oral   Take 20 mEq by mouth 2 (two) times daily.         . pravastatin (PRAVACHOL) 20 MG tablet      TAKE ONE TABLET BY MOUTH EVERY DAY AT BEDTIME   30 tablet   2    Ht 5\' 6"  (1.676 m)  Wt 220 lb (99.791 kg)  BMI 35.53 kg/m2 Physical Exam  Nursing note and vitals reviewed. Constitutional: She is oriented to person, place, and time. She appears well-developed and well-nourished.  HENT:  Head: Normocephalic and atraumatic.  Eyes: EOM are normal.  Cardiovascular: Normal rate and regular rhythm.   Pulmonary/Chest:  Effort normal.  Abdominal: Soft.  Musculoskeletal: She exhibits tenderness.  Left paralumbar tenderness.   Neurological: She is alert  and oriented to person, place, and time.  Symmetric DTR to lower extremites. Equal dorsal plantar flexion. Equal sensory to light touch intact.   Skin: Skin is warm and dry.  Psychiatric: She has a normal mood and affect.    ED Course  Procedures (including critical care time)      COORDINATION OF CARE: 12:37 AM -Will order Flexeril and Dilaudid injections and a x-ray of the lumbar spine. Patient verbalizes understanding and agrees with treatment plan.    Labs Review Labs Reviewed - No data to display Imaging Review Dg Lumbar Spine Complete  04/16/2013   CLINICAL DATA:  Pain.  EXAM: LUMBAR SPINE - COMPLETE 4+ VIEW  COMPARISON:  CT 03/04/2012.  FINDINGS: Diffuse degenerative change present. There is no evidence of fracture. No acute abnormality identified. Aortoiliac atherosclerotic vascular disease. Rounded opacities left upper quadrant most likely pill fragments.  IMPRESSION: Diffuse degenerative change, no acute abnormality.   Electronically Signed   By: Marcello Moores  Register   On: 04/16/2013 01:20    2:10 AM recheck after medications, feeling a lot better - now able to ambulate with pain and spasm much improved.   Plan d/c home. PT will continue her hydrocodone and flexeril as needed. She has scheduled follow up with her PCP tomorrow and agrees to keep that appt. Return precautions provided.   MDM   Dx: low back pain  Evaluated with xrays as above - no acute Fx or abnormality identified. No red flags to suggest indication for emergent MRI tonight, improved with medications including Im narcotics.  VS and nurses notes reviewed.    I personally performed the services described in this documentation, which was scribed in my presence. The recorded information has been reviewed and is accurate.     Teressa Lower, MD 04/16/13 [id number]

## 2013-04-16 NOTE — ED Notes (Signed)
Pt experiencing some nausea following returning from X-ray.

## 2013-04-16 NOTE — Progress Notes (Signed)
   Subjective:    Patient ID: Krista Strickland, female    DOB: Jun 09, 1950, 63 y.o.   MRN: [id number]  HPI The PT is here for follow up and re-evaluation of chronic medical conditions, medication management and review of any available recent lab and radiology data.  Preventive health is updated, specifically  Cancer screening and Immunization.   Was seen yesterday in the Ed due to severe disabling back pain radiiting down left leg, she has established disc disease, no trigger known for this episode. No recent gout flares The PT denies any adverse reactions to current medications since the last visit.  Blood sugar reportedly is well controlled, she denies polyuria, polydipsia , blurred vision or hypoglycemic episodes    Review of Systems See HPI Denies recent fever or chills. Denies sinus pressure, nasal congestion, ear pain or sore throat. Denies chest congestion, productive cough or wheezing. Denies chest pains, palpitations and leg swelling Denies abdominal pain, nausea, vomiting,diarrhea or constipation.   Denies dysuria, frequency, hesitancy or incontinence.  Denies headaches, seizures, numbness, or tingling. Denies depression, anxiety or insomnia. Denies skin break down or rash.        Objective:   Physical Exam BP 138/64  Pulse 70  Resp 18  Ht 5\' 3"  (1.6 m)  Wt 226 lb 1.9 oz (102.567 kg)  BMI 40.07 kg/m2  SpO2 94% Patient alert and oriented and in no cardiopulmonary distress.  HEENT: No facial asymmetry, EOMI, no sinus tenderness,  oropharynx pink and moist.  Neck supple no adenopathy.  Chest: Clear to auscultation bilaterally.  CVS: S1, S2 no murmurs, no S3.  ABD: Soft non tender. Bowel sounds normal.  Ext: No edema  MS: Adequate though reduced  ROM spine, shoulders, hips and knees.  Skin: Intact, no ulcerations or rash noted.  Psych: Good eye contact, normal affect. Memory intact not anxious or depressed appearing.  CNS: CN 2-12 intact, power, tone and  sensation normal throughout.        Assessment & Plan:  Back pain with left-sided sciatica Acute flare , treated in Ed 1 day ago, now resolved  HYPERTENSION Controlled, no change in medication DASH diet and commitment to daily physical activity for a minimum of 30 minutes discussed and encouraged, as a part of hypertension management. The importance of attaining a healthy weight is also discussed.   Type 2 diabetes mellitus with diabetic nephropathy Improved,  Treated by endo Patient advised to reduce carb and sweets, commit to regular physical activity, take meds as prescribed, test blood as directed, and attempt to lose weight, to improve blood sugar control.   HYPERLIPIDEMIA Elevated TG, add zetia, continue statin as before Hyperlipidemia:Low fat diet discussed and encouraged.  Updated lab needed at/ before next visit.   OBESITY Unchanged Patient re-educated about  the importance of commitment to a  minimum of 150 minutes of exercise per week. The importance of healthy food choices with portion control discussed. Encouraged to start a food diary, count calories and to consider  joining a support group. Sample diet sheets offered. Goals set by the patient for the next several months.     GOUT, UNSPECIFIED Stable, no recent f;lare, f/u uric acid level is slightly elevated at 7.1, dietary discretion stressed , no change in med at this time

## 2013-04-16 NOTE — ED Notes (Signed)
Pt ambulated around nurses station.  Reporting improvement in ease of movement.

## 2013-04-18 NOTE — Assessment & Plan Note (Addendum)
Stable, no recent f;lare, f/u uric acid level is slightly elevated at 7.1, dietary discretion stressed , no change in med at this time

## 2013-04-18 NOTE — Assessment & Plan Note (Signed)
Improved,  Treated by endo Patient advised to reduce carb and sweets, commit to regular physical activity, take meds as prescribed, test blood as directed, and attempt to lose weight, to improve blood sugar control.

## 2013-04-18 NOTE — Assessment & Plan Note (Signed)
Elevated TG, add zetia, continue statin as before Hyperlipidemia:Low fat diet discussed and encouraged.  Updated lab needed at/ before next visit.

## 2013-04-18 NOTE — Assessment & Plan Note (Signed)
Controlled, no change in medication DASH diet and commitment to daily physical activity for a minimum of 30 minutes discussed and encouraged, as a part of hypertension management. The importance of attaining a healthy weight is also discussed.  

## 2013-04-18 NOTE — Assessment & Plan Note (Signed)
Unchanged. Patient re-educated about  the importance of commitment to a  minimum of 150 minutes of exercise per week. The importance of healthy food choices with portion control discussed. Encouraged to start a food diary, count calories and to consider  joining a support group. Sample diet sheets offered. Goals set by the patient for the next several months.    

## 2013-04-18 NOTE — Assessment & Plan Note (Signed)
Acute flare , treated in Ed 1 day ago, now resolved

## 2013-04-23 ENCOUNTER — Other Ambulatory Visit: Payer: Self-pay

## 2013-04-23 MED ORDER — PRAVASTATIN SODIUM 20 MG PO TABS: ORAL_TABLET | ORAL | Status: DC

## 2013-04-23 MED ORDER — METOPROLOL TARTRATE 100 MG PO TABS: ORAL_TABLET | ORAL | Status: DC

## 2013-04-23 MED ORDER — METOLAZONE 5 MG PO TABS: ORAL_TABLET | ORAL | Status: DC

## 2013-04-23 MED ORDER — DILTIAZEM HCL ER 180 MG PO CP24: 180.0000 mg | ORAL_CAPSULE | Freq: Every day | ORAL | Status: DC

## 2013-06-11 ENCOUNTER — Encounter (HOSPITAL_COMMUNITY): Payer: Self-pay | Admitting: Emergency Medicine

## 2013-06-11 ENCOUNTER — Emergency Department (HOSPITAL_COMMUNITY)
Admission: EM | Admit: 2013-06-11 | Discharge: 2013-06-11 | Disposition: A | Discharge status: Home / Self Care | Payer: Medicare Other | Attending: Emergency Medicine | Admitting: Emergency Medicine

## 2013-06-11 DIAGNOSIS — T465X5A Adverse effect of other antihypertensive drugs, initial encounter: Secondary | ICD-10-CM | POA: Insufficient documentation

## 2013-06-11 DIAGNOSIS — Z8619 Personal history of other infectious and parasitic diseases: Secondary | ICD-10-CM | POA: Insufficient documentation

## 2013-06-11 DIAGNOSIS — N189 Chronic kidney disease, unspecified: Secondary | ICD-10-CM | POA: Insufficient documentation

## 2013-06-11 DIAGNOSIS — Z87891 Personal history of nicotine dependence: Secondary | ICD-10-CM | POA: Insufficient documentation

## 2013-06-11 DIAGNOSIS — Z7982 Long term (current) use of aspirin: Secondary | ICD-10-CM | POA: Insufficient documentation

## 2013-06-11 DIAGNOSIS — Z794 Long term (current) use of insulin: Secondary | ICD-10-CM | POA: Insufficient documentation

## 2013-06-11 DIAGNOSIS — T783XXA Angioneurotic edema, initial encounter: Secondary | ICD-10-CM | POA: Insufficient documentation

## 2013-06-11 DIAGNOSIS — I2510 Atherosclerotic heart disease of native coronary artery without angina pectoris: Secondary | ICD-10-CM | POA: Insufficient documentation

## 2013-06-11 DIAGNOSIS — Z8719 Personal history of other diseases of the digestive system: Secondary | ICD-10-CM | POA: Insufficient documentation

## 2013-06-11 DIAGNOSIS — Z8614 Personal history of Methicillin resistant Staphylococcus aureus infection: Secondary | ICD-10-CM | POA: Insufficient documentation

## 2013-06-11 DIAGNOSIS — Z880 Allergy status to penicillin: Secondary | ICD-10-CM | POA: Insufficient documentation

## 2013-06-11 DIAGNOSIS — Z79899 Other long term (current) drug therapy: Secondary | ICD-10-CM | POA: Insufficient documentation

## 2013-06-11 DIAGNOSIS — E119 Type 2 diabetes mellitus without complications: Secondary | ICD-10-CM | POA: Insufficient documentation

## 2013-06-11 DIAGNOSIS — Z791 Long term (current) use of non-steroidal anti-inflammatories (NSAID): Secondary | ICD-10-CM | POA: Insufficient documentation

## 2013-06-11 DIAGNOSIS — Z8673 Personal history of transient ischemic attack (TIA), and cerebral infarction without residual deficits: Secondary | ICD-10-CM | POA: Insufficient documentation

## 2013-06-11 DIAGNOSIS — E669 Obesity, unspecified: Secondary | ICD-10-CM | POA: Insufficient documentation

## 2013-06-11 DIAGNOSIS — T464X5A Adverse effect of angiotensin-converting-enzyme inhibitors, initial encounter: Secondary | ICD-10-CM

## 2013-06-11 DIAGNOSIS — E785 Hyperlipidemia, unspecified: Secondary | ICD-10-CM | POA: Insufficient documentation

## 2013-06-11 DIAGNOSIS — I129 Hypertensive chronic kidney disease with stage 1 through stage 4 chronic kidney disease, or unspecified chronic kidney disease: Secondary | ICD-10-CM | POA: Insufficient documentation

## 2013-06-11 MED ORDER — PREDNISONE 10 MG PO TABS: 20.0000 mg | ORAL_TABLET | Freq: Two times a day (BID) | ORAL | Status: DC

## 2013-06-11 MED ORDER — METHYLPREDNISOLONE SODIUM SUCC 125 MG IJ SOLR
125.0000 mg | Freq: Once | INTRAMUSCULAR | Status: AC
  Administered 2013-06-11: 125 mg via INTRAVENOUS
  Filled 2013-06-11 (×2): qty 2

## 2013-06-11 MED ORDER — DIPHENHYDRAMINE HCL 50 MG/ML IJ SOLN
25.0000 mg | Freq: Once | INTRAMUSCULAR | Status: AC
  Administered 2013-06-11: 25 mg via INTRAVENOUS
  Filled 2013-06-11: qty 1

## 2013-06-11 NOTE — ED Notes (Signed)
Pt reports mild decrease in swelling of tongue.  No drastic changes noted to swelling.  No evidence of swelling worsening.

## 2013-06-11 NOTE — ED Notes (Signed)
Pt states she woke up with her tongue swollen.

## 2013-06-11 NOTE — ED Provider Notes (Signed)
CSN: [id number]     Arrival date & time 06/11/13  0920 History  This chart was scribed for Veryl Speak, MD,  by Stacy Gardner, ED Scribe. The patient was seen in room APA08/[facility]08 and the patient's care was started at 9:38 AM.   First MD Initiated Contact with Patient 06/11/13 [phone]     Chief Complaint  Patient presents with  . Oral Swelling     (Consider location/radiation/quality/duration/timing/severity/associated sxs/prior Treatment) The history is provided by the patient and medical records. No language interpreter was used.   HPI Comments: Krista Strickland is a 63 y.o. female who presents to the Emergency Department complaining of oral swell, onset this morning. She was awaken this morning with a stinging sensation and that is when she noticed the swelling.  Pt takes  Lisinopril. She is able trouble talking.  Pt had a similar episode 2-3 years ago that was much worse than today. Pt reports she as unable to talk in the prior event. She was taken off of the HTN medication during that event and have not taken it since. Denies trouble breathing and trouble swallowing.  Past Medical History  Diagnosis Date  . Hyperlipidemia   . Obesity   . Diabetes mellitus, type 2   . Hypertension   . History of MRSA infection 03/2009    on ant abdomen   . Chronic kidney disease   . Gout   . Psoriasis   . Stroke   . Coronary artery disease   . GERD (gastroesophageal reflux disease)    Past Surgical History  Procedure Laterality Date  . Partial hysterectomy    . Rt,. neck biopsy    . Excison of rt breast cyst    . Appendectomy  2012  . Laparoscopic salpingo oopherectomy Right 04/22/2012    Procedure: LAPAROSCOPIC SALPINGO OOPHORECTOMY;  Surgeon: Jonnie Kind, MD;  Location: [facility];  Service: Gynecology;  Laterality: Right;  end 11:17  . Mass excision N/A 04/22/2012    Procedure: EXCISION SKIN TAGS NECK AND HEAD;  Surgeon: Jonnie Kind, MD;  Location: [facility];  Service: Gynecology;   Laterality: N/A;  start 11:19   Family History  Problem Relation Age of Onset  . Gout Sister   . Hypertension Sister   . Stroke Sister   . Gout Brother   . Gout Brother   . Gout Brother   . Hypertension Brother   . Hypertension Brother    History  Substance Use Topics  . Smoking status: Former Research scientist (life sciences)  . Smokeless tobacco: Not on file  . Alcohol Use: No   OB History   Grav Para Term Preterm Abortions TAB SAB Ect Mult Living   1 1             Review of Systems  HENT: Negative for trouble swallowing.        Oral swelling.      Allergies  Benazepril; Fish allergy; Metronidazole; Mobic; Penicillins; and Sulfonamide derivatives  Home Medications   Prior to Admission medications   Medication Sig Start Date End Date Taking? Authorizing Provider  allopurinol (ZYLOPRIM) 300 MG tablet Take 300 mg by mouth daily.      Historical Provider, MD  aspirin 325 MG tablet Take 325 mg by mouth daily.    Historical Provider, MD  calcitRIOL (ROCALTROL) 0.25 MCG capsule Take 0.25 mcg by mouth every other day.     Historical Provider, MD  cyclobenzaprine (FLEXERIL) 10 MG tablet Take 10 mg by mouth 3 (  three) times daily as needed. Muscle Spasms    Historical Provider, MD  diltiazem (DILACOR XR) 180 MG 24 hr capsule Take 1 capsule (180 mg total) by mouth daily. 04/23/13   Fayrene Helper, MD  docusate sodium (COLACE) 100 MG capsule Take 100 mg by mouth daily as needed. Constipation    Historical Provider, MD  exenatide (BYETTA 10 MCG PEN) 10 MCG/0.04ML SOLN Inject 5 mcg into the skin 2 (two) times daily with a meal.  02/07/11   Fayrene Helper, MD  ezetimibe (ZETIA) 10 MG tablet Take 1 tablet (10 mg total) by mouth daily. 04/16/13   Fayrene Helper, MD  furosemide (LASIX) 40 MG tablet Take 1 tablet (40 mg total) by mouth 2 (two) times daily. 12/02/12   Fayrene Helper, MD  HYDROcodone-acetaminophen (NORCO) 7.5-325 MG per tablet Take 1 tablet by mouth every 6 (six) hours as needed. Pain     Historical Provider, MD  insulin glargine (LANTUS SOLOSTAR) 100 UNIT/ML injection Inject 40 Units into the skin at bedtime.  11/09/10   Fayrene Helper, MD  lisinopril (PRINIVIL,ZESTRIL) 5 MG tablet Take 5 mg by mouth daily.    Historical Provider, MD  metolazone (ZAROXOLYN) 5 MG tablet TAKE ONE TABLET BY MOUTH ONCE DAILY 04/23/13   Fayrene Helper, MD  metoprolol (LOPRESSOR) 100 MG tablet TAKE ONE TABLET BY MOUTH TWICE DAILY 04/23/13   Fayrene Helper, MD  midodrine (PROAMATINE) 5 MG tablet Take 5 mg by mouth 2 (two) times daily.    Historical Provider, MD  potassium chloride (K-DUR,KLOR-CON) 10 MEQ tablet Take 20 mEq by mouth 2 (two) times daily. 03/06/12   Fayrene Helper, MD  pravastatin (PRAVACHOL) 20 MG tablet TAKE ONE TABLET BY MOUTH EVERY DAY AT BEDTIME 04/23/13   Fayrene Helper, MD  VOLTAREN 1 % GEL Apply 4 g topically 3 (three) times daily. 03/06/13   Sanjuana Kava, MD   BP 151/63  Pulse 71  Temp(Src) 98.9 F (37.2 C) (Oral)  Resp 16  Ht 5\' 6"  (1.676 m)  Wt 260 lb (117.935 kg)  BMI 41.99 kg/m2  SpO2 96% Physical Exam  Nursing note and vitals reviewed. Constitutional: She is oriented to person, place, and time. She appears well-developed and well-nourished.  HENT:  Head: Normocephalic.  Mouth/Throat: Oropharynx is clear and moist.  There is swelling of the left side of the tongue. There is no stridor and no airway occlusion.  Eyes: EOM are normal. Pupils are equal, round, and reactive to light.  Neck: Normal range of motion.  Cardiovascular: Normal rate, regular rhythm and normal heart sounds.  Exam reveals no gallop and no friction rub.   No murmur heard. Pulmonary/Chest: Effort normal and breath sounds normal. No respiratory distress. She has no wheezes. She has no rales.  Musculoskeletal: Normal range of motion.  Neurological: She is alert and oriented to person, place, and time. No cranial nerve deficit. She exhibits normal muscle tone. Coordination normal.   Skin: Skin is warm and dry.  Psychiatric: She has a normal mood and affect. Her behavior is normal.    ED Course  Procedures (including critical care time) DIAGNOSTIC STUDIES: Oxygen Saturation is 96% on room air, normal by my interpretation.    COORDINATION OF CARE:  9:40 AM Discussed course of care with pt which includes Benadryl and Solu-Medrol. Advised pt to discontinue the use of Lisinopril.  Pt understands and agrees.   Labs Review Labs Reviewed - No data to display  Imaging  Review No results found.   EKG Interpretation None      MDM   Final diagnoses:  None    Patient is a 63 year old female who presents with swelling of the tongue. This appears to be related to angioedema do to her ACE inhibitor treatment. She was given IV steroids and antihistamines and observed for a period of 3 hours. Her symptoms are improving and I feel as though she is appropriate for discharge. I will prescribe prednisone and Benadryl and advised her to stop her lisinopril. She understands to return if she develops difficulty swallowing or breathing.  I personally performed the services described in this documentation, which was scribed in my presence. The recorded information has been reviewed and is accurate.      Veryl Speak, MD 06/11/13 [phone]

## 2013-06-11 NOTE — Discharge Instructions (Signed)
Prednisone as prescribed. Benadryl 25 mg every 6 hours for the next 3 days.  Stop your lisinopril and keep a record of your blood pressures. Followup with your primary Dr. next week to discuss your medications.  Return to the emergency department if you develop difficulty breathing or swallowing or any other new and concerning symptoms.   Angioedema Angioedema is a sudden swelling of tissues, often of the skin. It can occur on the face or genitals or in the abdomen or other body parts. The swelling usually develops over a short period and gets better in 24 to 48 hours. It often begins during the night and is found when the person wakes up. The person may also get red, itchy patches of skin (hives). Angioedema can be dangerous if it involves swelling of the air passages.  Depending on the cause, episodes of angioedema may only happen once, come back in unpredictable patterns, or repeat for several years and then gradually fade away.  CAUSES  Angioedema can be caused by an allergic reaction to various triggers. It can also result from nonallergic causes, including reactions to drugs, immune system disorders, viral infections, or an abnormal gene that is passed to you from your parents (hereditary). For some people with angioedema, the cause is unknown.  Some things that can trigger angioedema include:   Foods.   Medicines, such as ACE inhibitors, ARBs, nonsteroidal anti-inflammatory agents, or estrogen.   Latex.   Animal saliva.   Insect stings.   Dyes used in X-rays.   Mild injury.   Dental work.  Surgery.  Stress.   Sudden changes in temperature.   Exercise. SIGNS AND SYMPTOMS   Swelling of the skin.  Hives. If these are present, there is also intense itching.  Redness in the affected area.   Pain in the affected area.  Swollen lips or tongue.  Breathing problems. This may happen if the air passages swell.  Wheezing. If internal organs are involved, there  may be:   Nausea.   Abdominal pain.   Vomiting.   Difficulty swallowing.   Difficulty passing urine. DIAGNOSIS   Your health care provider will examine the affected area and take a medical and family history.  Various tests may be done to help determine the cause. Tests may include:  Allergy skin tests to see if the problem is an allergic reaction.   Blood tests to check for hereditary angioedema.   Tests to check for underlying diseases that could cause the condition.   A review of your medicines, including over the counter medicines, may be done. TREATMENT  Treatment will depend on the cause of the angioedema. Possible treatments include:   Removal of anything that triggered the condition (such as stopping certain medicines).   Medicines to treat symptoms or prevent attacks. Medicines given may include:   Antihistamines.   Epinephrine injection.   Steroids.   Hospitalization may be required for severe attacks. If the air passages are affected, it can be an emergency. Tubes may need to be placed to keep the airway open. HOME CARE INSTRUCTIONS   Only take over-the-counter or prescription medicines as directed by your health care provider.  If you were given medicines for emergency allergy treatment, always carry them with you.  Wear a medical bracelet as directed by your health care provider.   Avoid known triggers. SEEK MEDICAL CARE IF:   You have repeat attacks of angioedema.   Your attacks are more frequent or more severe despite preventive measures.  You have hereditary angioedema and are considering having children. It is important to discuss the risks of passing the condition on to your children with your health care provider. SEEK IMMEDIATE MEDICAL CARE IF:   You have severe swelling of the mouth, tongue, or lips.  You have difficulty breathing.   You have difficulty swallowing.   You faint. MAKE SURE YOU:  Understand these  instructions.  Will watch your condition.  Will get help right away if you are not doing well or get worse. Document Released: 03/26/2001 Document Revised: 11/05/2012 Document Reviewed: 09/08/2012 [facility] Patient Information 2014 [place], [place].

## 2013-06-28 ENCOUNTER — Other Ambulatory Visit: Payer: Self-pay | Admitting: Family Medicine

## 2013-07-30 ENCOUNTER — Other Ambulatory Visit: Payer: Self-pay | Admitting: Family Medicine

## 2013-07-31 LAB — LIPID PANEL
Cholesterol: 115 mg/dL (ref 0–200)
HDL: 26 mg/dL — ABNORMAL LOW (ref >39)
LDL Cholesterol: 30 mg/dL (ref 0–99)
Total CHOL/HDL Ratio: 4.4 Ratio
Triglycerides: 293 mg/dL — ABNORMAL HIGH (ref <150)
VLDL: 59 mg/dL — ABNORMAL HIGH (ref 0–40)

## 2013-07-31 LAB — COMPLETE METABOLIC PANEL WITH GFR
ALBUMIN: 4 g/dL (ref 3.5–5.2)
ALK PHOS: 69 U/L (ref 39–117)
ALT: 23 U/L (ref 0–35)
AST: 19 U/L (ref 0–37)
BUN: 33 mg/dL — AB (ref 6–23)
CO2: 36 mEq/L — ABNORMAL HIGH (ref 19–32)
CREATININE: 1.28 mg/dL — AB (ref 0.50–1.10)
Calcium: 9.3 mg/dL (ref 8.4–10.5)
Chloride: 97 mEq/L (ref 96–112)
GFR, Est African American: 51 mL/min — ABNORMAL LOW
GFR, Est Non African American: 45 mL/min — ABNORMAL LOW
Glucose, Bld: 163 mg/dL — ABNORMAL HIGH (ref 70–99)
POTASSIUM: 2.9 meq/L — AB (ref 3.5–5.3)
Sodium: 142 mEq/L (ref 135–145)
Total Bilirubin: 0.5 mg/dL (ref 0.2–1.2)
Total Protein: 6.4 g/dL (ref 6.0–8.3)

## 2013-08-02 ENCOUNTER — Other Ambulatory Visit: Payer: Self-pay | Admitting: Family Medicine

## 2013-08-04 LAB — URIC ACID: Uric Acid, Serum: 8.2 mg/dL — ABNORMAL HIGH (ref 2.4–7.0)

## 2013-08-05 ENCOUNTER — Ambulatory Visit (INDEPENDENT_AMBULATORY_CARE_PROVIDER_SITE_OTHER): Payer: Medicare Other | Admitting: Family Medicine

## 2013-08-05 ENCOUNTER — Encounter (INDEPENDENT_AMBULATORY_CARE_PROVIDER_SITE_OTHER): Payer: Self-pay

## 2013-08-05 ENCOUNTER — Encounter: Payer: Self-pay | Admitting: Family Medicine

## 2013-08-05 VITALS — BP 142/76 | HR 76 | Resp 18 | Ht 63.0 in | Wt 212.0 lb

## 2013-08-05 DIAGNOSIS — N183 Chronic kidney disease, stage 3 unspecified: Secondary | ICD-10-CM

## 2013-08-05 DIAGNOSIS — I10 Essential (primary) hypertension: Secondary | ICD-10-CM

## 2013-08-05 DIAGNOSIS — E669 Obesity, unspecified: Secondary | ICD-10-CM

## 2013-08-05 DIAGNOSIS — E876 Hypokalemia: Secondary | ICD-10-CM

## 2013-08-05 DIAGNOSIS — E785 Hyperlipidemia, unspecified: Secondary | ICD-10-CM

## 2013-08-05 DIAGNOSIS — Z23 Encounter for immunization: Secondary | ICD-10-CM

## 2013-08-05 DIAGNOSIS — N058 Unspecified nephritic syndrome with other morphologic changes: Secondary | ICD-10-CM

## 2013-08-05 DIAGNOSIS — E1121 Type 2 diabetes mellitus with diabetic nephropathy: Secondary | ICD-10-CM

## 2013-08-05 DIAGNOSIS — M109 Gout, unspecified: Secondary | ICD-10-CM

## 2013-08-05 DIAGNOSIS — E781 Pure hyperglyceridemia: Secondary | ICD-10-CM | POA: Insufficient documentation

## 2013-08-05 DIAGNOSIS — E1129 Type 2 diabetes mellitus with other diabetic kidney complication: Secondary | ICD-10-CM

## 2013-08-05 MED ORDER — EZETIMIBE 10 MG PO TABS: 10.0000 mg | ORAL_TABLET | Freq: Every day | ORAL | Status: DC

## 2013-08-05 NOTE — Patient Instructions (Addendum)
Complete physical exam ion 3.5 month, call if you need me before  You are referred to diabetes specialist in [facility] per your request  Your tG are too high, reduce cheese , butter , red meat and egg yolk, new for TG is zetia, continue pravachol as before   Prevnar today  Fasting lipid, cmp and EGFr in 3.5 month  You are referred for eye exam

## 2013-08-06 LAB — BASIC METABOLIC PANEL
BUN: 38 mg/dL — ABNORMAL HIGH (ref 6–23)
CO2: 32 meq/L (ref 19–32)
Calcium: 10 mg/dL (ref 8.4–10.5)
Chloride: 98 mEq/L (ref 96–112)
Creat: 1.52 mg/dL — ABNORMAL HIGH (ref 0.50–1.10)
GLUCOSE: 130 mg/dL — AB (ref 70–99)
POTASSIUM: 3 meq/L — AB (ref 3.5–5.3)
SODIUM: 141 meq/L (ref 135–145)

## 2013-08-07 ENCOUNTER — Other Ambulatory Visit: Payer: Self-pay

## 2013-08-07 MED ORDER — FUROSEMIDE 40 MG PO TABS: ORAL_TABLET | ORAL | Status: DC

## 2013-08-09 NOTE — Assessment & Plan Note (Addendum)
No recent flare updated uric acid level is still elevated despite daily allopurinol 300mg , dietary change discussed to help with lowering uric acid , pt denies excessive red meat or cheese and does not drink wine or alcohol

## 2013-08-09 NOTE — Assessment & Plan Note (Signed)
Hyperlipidemia:Low fat diet discussed and encouraged.  Start zetia and continue statin

## 2013-08-09 NOTE — Progress Notes (Signed)
Subjective:    Patient ID: Krista Strickland, female    DOB: 1950/03/19, 63 y.o.   MRN: [id number]  HPI The PT is here for follow up and re-evaluation of chronic medical conditions, medication management and review of any available recent lab and radiology data.  Preventive health is updated, specifically  Cancer screening and Immunization.   Pt states categorically that she wishes to change her current endocrinologist, concerned that she is not listened to, states will go to [place], which is the nearest Md States had allergic reaction to ACE which had been prescribed by endo Denies polyuria, polydipsia or hypoglycemic episodes        Review of Systems See HPI Denies recent fever or chills. Denies sinus pressure, nasal congestion, ear pain or sore throat. Denies chest congestion, productive cough or wheezing. Denies chest pains, palpitations and leg swelling Denies abdominal pain, nausea, vomiting,diarrhea or constipation.   Denies dysuria, frequency, hesitancy or incontinence. Denies significant  joint pain, swelling and limitation in mobility. Denies headaches, seizures, numbness, or tingling. Denies depression, or insomnia.Does have anxiety as far as her health is concerned C/o lesion on buttock present several weeks ago, better , though it was related to her diabetes       Objective:   Physical Exam  BP 142/76  Pulse 76  Resp 18  Ht 5\' 3"  (1.6 m)  Wt 212 lb 0.6 oz (96.181 kg)  BMI 37.57 kg/m2  SpO2 94% Patient alert and oriented and in no cardiopulmonary distress.  HEENT: No facial asymmetry, EOMI,   oropharynx pink and moist.  Neck supple no JVD, no mass.  Chest: Clear to auscultation bilaterally.  CVS: S1, S2 no murmurs, no S3.Regular rate.  ABD: Soft non tender.   Ext: No edema  MS: Adequate ROM spine, shoulders, hips and knees.  Skin: Intact, no ulcerations or rash noted.No palpable or visible lesion in area of concern on buttock  Psych: Good eye  contact, normal affect. Memory intact not anxious or depressed appearing.  CNS: CN 2-12 intact, power,  normal throughout.no focal deficits noted.       Assessment & Plan:  Type 2 diabetes mellitus with diabetic nephropathy Improved, note from endo in 04/2013 reports HBA1C of 7.2 Patient advised to reduce carb and sweets, commit to regular physical activity, take meds as prescribed, test blood as directed, and attempt to lose weight, to improve blood sugar control. Pt reports  That she wishes to change to a new endo, she understands that she will need to go to [facility] , and she states that this is what she wants to do, I will therefore refer Med assistance sent by this office for her lantus and byetta  Hypertriglyceridemia Hyperlipidemia:Low fat diet discussed and encouraged.  Start zetia and continue statin  HYPERLIPIDEMIA Cholesterol controlled , though HDL is low, increased and more regular exercise is encouraged. No change in statin Hyperlipidemia:Low fat diet discussed and encouraged.    OBESITY Improved. Pt applauded on succesful weight loss through lifestyle change, and encouraged to continue same. Weight loss goal set for the next several months. Goals set by the patient for the next several months.     GOUT, UNSPECIFIED No recent flare updated uric acid level is still elevated despite daily allopurinol 300mg , dietary change discussed to help with lowering uric acid , pt denies excessive red meat or cheese and does not drink wine or alcohol  CHRONIC KIDNEY DISEASE STAGE III (MODERATE) Improved following acute decline, pt reports change  in med prescribed by endo positively affected this. Counselled re avoidance of NSAID use, and encouraged to improve blood sugar control  HYPERTENSION UnControlled, at this visit, no change in medication DASH diet and commitment to daily physical activity for a minimum of 30 minutes discussed and encouraged, as a part of  hypertension management. The importance of attaining a healthy weight is also discussed.

## 2013-08-09 NOTE — Assessment & Plan Note (Addendum)
Improved. Pt applauded on succesful weight loss through lifestyle change, and encouraged to continue same. Weight loss goal set for the next several months. Goals set by the patient for the next several months.

## 2013-08-09 NOTE — Assessment & Plan Note (Signed)
Improved following acute decline, pt reports change in med prescribed by endo positively affected this. Counselled re avoidance of NSAID use, and encouraged to improve blood sugar control

## 2013-08-09 NOTE — Assessment & Plan Note (Signed)
Improved, note from endo in 04/2013 reports HBA1C of 7.2 Patient advised to reduce carb and sweets, commit to regular physical activity, take meds as prescribed, test blood as directed, and attempt to lose weight, to improve blood sugar control. Pt reports  That she wishes to change to a new endo, she understands that she will need to go to [facility] , and she states that this is what she wants to do, I will therefore refer Med assistance sent by this office for her lantus and byetta

## 2013-08-09 NOTE — Assessment & Plan Note (Signed)
Cholesterol controlled , though HDL is low, increased and more regular exercise is encouraged. No change in statin Hyperlipidemia:Low fat diet discussed and encouraged.

## 2013-08-09 NOTE — Assessment & Plan Note (Addendum)
UnControlled, at this visit, no change in medication DASH diet and commitment to daily physical activity for a minimum of 30 minutes discussed and encouraged, as a part of hypertension management. The importance of attaining a healthy weight is also discussed.

## 2013-08-12 LAB — BASIC METABOLIC PANEL
BUN: 27 mg/dL — ABNORMAL HIGH (ref 6–23)
CALCIUM: 9.3 mg/dL (ref 8.4–10.5)
CO2: 32 meq/L (ref 19–32)
CREATININE: 1.33 mg/dL — AB (ref 0.50–1.10)
Chloride: 97 mEq/L (ref 96–112)
GLUCOSE: 146 mg/dL — AB (ref 70–99)
Potassium: 3 mEq/L — ABNORMAL LOW (ref 3.5–5.3)
Sodium: 140 mEq/L (ref 135–145)

## 2013-08-13 NOTE — Addendum Note (Signed)
Addended by: Eual Fines on: 08/13/2013 03:24 PM   Modules accepted: Orders

## 2013-08-20 ENCOUNTER — Encounter: Payer: Self-pay | Admitting: Endocrinology

## 2013-08-20 ENCOUNTER — Ambulatory Visit (INDEPENDENT_AMBULATORY_CARE_PROVIDER_SITE_OTHER): Payer: Medicare Other | Admitting: Endocrinology

## 2013-08-20 VITALS — BP 122/60 | HR 80 | Temp 99.2°F | Ht 65.5 in | Wt 208.0 lb

## 2013-08-20 DIAGNOSIS — E11628 Type 2 diabetes mellitus with other skin complications: Secondary | ICD-10-CM

## 2013-08-20 DIAGNOSIS — L84 Corns and callosities: Secondary | ICD-10-CM

## 2013-08-20 DIAGNOSIS — E1169 Type 2 diabetes mellitus with other specified complication: Secondary | ICD-10-CM

## 2013-08-20 LAB — MICROALBUMIN / CREATININE URINE RATIO
Creatinine,U: 58.6 mg/dL
MICROALB UR: 0.5 mg/dL (ref 0.0–1.9)
Microalb Creat Ratio: 0.9 mg/g (ref 0.0–30.0)

## 2013-08-20 LAB — HEMOGLOBIN A1C: Hgb A1c MFr Bld: 7.3 % — ABNORMAL HIGH (ref 4.6–6.5)

## 2013-08-20 MED ORDER — INSULIN NPH (HUMAN) (ISOPHANE) 100 UNIT/ML ~~LOC~~ SUSP: 40.0000 [IU] | SUBCUTANEOUS | Status: DC

## 2013-08-20 NOTE — Progress Notes (Signed)
Subjective:    Patient ID: Krista Strickland, female    DOB: 08-16-1950, 63 y.o.   MRN: [id number]  HPI pt states DM was dx'ed in 2005; she has moderate neuropathy of the lower extremities; she has associated renal insufficiency and CVA; he has been on insulin since 2010; pt says her diet and exercise are good; she has never had GDM, pancreatitis, severe hypoglycemia or DKA.   she brings a record of her cbg's which i have reviewed today.  It varies from 111-183.  There is no trend throughout the day.  She says she cannot afford meds.  Past Medical History  Diagnosis Date  . Hyperlipidemia   . Obesity   . Diabetes mellitus, type 2   . Hypertension   . History of MRSA infection 03/2009    on ant abdomen   . Chronic kidney disease   . Gout   . Psoriasis   . Stroke   . Coronary artery disease   . GERD (gastroesophageal reflux disease)     Past Surgical History  Procedure Laterality Date  . Partial hysterectomy    . Rt,. neck biopsy    . Excison of rt breast cyst    . Appendectomy  2012  . Laparoscopic salpingo oopherectomy Right 04/22/2012    Procedure: LAPAROSCOPIC SALPINGO OOPHORECTOMY;  Surgeon: Jonnie Kind, MD;  Location: [facility];  Service: Gynecology;  Laterality: Right;  end 11:17  . Mass excision N/A 04/22/2012    Procedure: EXCISION SKIN TAGS NECK AND HEAD;  Surgeon: Jonnie Kind, MD;  Location: [facility];  Service: Gynecology;  Laterality: N/A;  start 11:19    History   Social History  . Marital Status: Married    Spouse Name: N/A    Number of Children: 1  . Years of Education: N/A   Occupational History  . disabled     Social History Main Topics  . Smoking status: Former Research scientist (life sciences)  . Smokeless tobacco: Not on file  . Alcohol Use: No  . Drug Use: No  . Sexual Activity: Yes   Other Topics Concern  . Not on file   Social History Narrative  . No narrative on file    Current Outpatient Prescriptions on File Prior to Visit  Medication Sig Dispense Refill  .  allopurinol (ZYLOPRIM) 300 MG tablet Take 300 mg by mouth daily.        Marland Kitchen aspirin 325 MG tablet Take 325 mg by mouth daily.      . calcitRIOL (ROCALTROL) 0.25 MCG capsule Take 0.25 mcg by mouth every other day.       . cyclobenzaprine (FLEXERIL) 10 MG tablet Take 10 mg by mouth 3 (three) times daily as needed. Muscle Spasms      . diltiazem (DILACOR XR) 180 MG 24 hr capsule Take 1 capsule (180 mg total) by mouth daily.  30 capsule  5  . docusate sodium (COLACE) 100 MG capsule Take 100 mg by mouth daily as needed. Constipation      . ezetimibe (ZETIA) 10 MG tablet Take 1 tablet (10 mg total) by mouth daily.  30 tablet  5  . furosemide (LASIX) 40 MG tablet TAKE ONE TABLET BY MOUTH TWICE DAILY  60 tablet  2  . HYDROcodone-acetaminophen (NORCO) 7.5-325 MG per tablet Take 1 tablet by mouth every 6 (six) hours as needed. Pain      . metolazone (ZAROXOLYN) 5 MG tablet TAKE ONE TABLET BY MOUTH ONCE DAILY  30 tablet  4  . metoprolol (LOPRESSOR) 100 MG tablet TAKE ONE TABLET BY MOUTH TWICE DAILY  60 tablet  4  . midodrine (PROAMATINE) 5 MG tablet Take 5 mg by mouth daily.      . potassium chloride (KLOR-CON M10) 10 MEQ tablet Take 20 mEq by mouth 6 (six) times daily.   120 tablet  0  . pravastatin (PRAVACHOL) 20 MG tablet TAKE ONE TABLET BY MOUTH EVERY DAY AT BEDTIME  30 tablet  4  . VOLTAREN 1 % GEL Apply 4 g topically 3 (three) times daily.       No current facility-administered medications on file prior to visit.    Allergies  Allergen Reactions  . Benazepril Swelling  . Fish Allergy   . Metronidazole Hives  . Mobic [Meloxicam] Hives  . Penicillins Swelling  . Sulfonamide Derivatives Hives    Family History  Problem Relation Age of Onset  . Gout Sister   . Hypertension Sister   . Stroke Sister   . Gout Brother   . Gout Brother   . Gout Brother   . Hypertension Brother   . Hypertension Brother   . Diabetes Neg Hx     BP 122/60  Pulse 80  Temp(Src) 99.2 F (37.3 C) (Oral)  Ht 5'  5.5" (1.664 m)  Wt 208 lb (94.348 kg)  BMI 34.07 kg/m2  SpO2 91%   Review of Systems denies blurry vision, headache, chest pain, sob, n/v, muscle cramps, excessive diaphoresis, depression, cold intolerance, rhinorrhea, and easy bruising.  She has lost weight.  She has urinary frequency and leg cramps.      Objective:   Physical Exam VS: see vs page.  GEN: no distress. HEAD: head: no deformity. eyes: no periorbital swelling, no proptosis.  external nose and ears are normal.   mouth: no lesion seen NECK: supple, thyroid is not enlarged CHEST WALL: no deformity LUNGS:  Clear to auscultation CV: reg rate and rhythm, no murmur ABD: abdomen is soft, nontender.  no hepatosplenomegaly.  not distended.  no hernia MUSCULOSKELETAL: muscle bulk and strength are grossly normal.  no obvious joint swelling.  gait is normal and steady EXTEMITIES: no deformity.  no ulcer on the feet.  feet are of normal color and temp.  1+ bilat leg edema PULSES: dorsalis pedis intact bilat.  no carotid bruit NEURO:  cn 2-12 grossly intact.   readily moves all 4's.  sensation is intact to touch on the feet SKIN:  Normal texture and temperature.  No rash or suspicious lesion is visible.   NODES:  None palpable at the neck PSYCH: alert, well-oriented.  Does not appear anxious nor depressed.   Lab Results  Component Value Date   HGBA1C 7.3* 08/20/2013    i have reviewed the following outside records: Office notes   i reviewed electrocardiogram (04/16/12)    Assessment & Plan:  DM: mild exacerbation Obesity, new to me: byetta might help, but she cannot afford it. Hypertriglyceridemia: this may improve further with glycemic control--we'll plan to recheck in the future.      Patient is advised the following: Patient Instructions  good diet and exercise habits significanly improve the control of your diabetes.  please let me know if you wish to be referred to a dietician.  high blood sugar is very risky to  your health.  you should see an eye doctor and dentist every year.  You are at higher than average risk for pneumonia and hepatitis-B.  You should be vaccinated  against both.   controlling your blood pressure and cholesterol drastically reduces the damage diabetes does to your body.  this also applies to quitting smoking.  please discuss these with your doctor.  check your blood sugar twice a day.  vary the time of day when you check, between before the 3 meals, and at bedtime.  also check if you have symptoms of your blood sugar being too high or too low.  please keep a record of the readings and bring it to your next appointment here.  You can write it on any piece of paper.  please call us sooner if your blood sugar goes below 70, or if you have a lot of readings over 200. You can stop the byetta. Change lantus to NPH, 40 units each morning. Please come back for a follow-up appointment in 3 months.   blood tests are being requested for you today.  We'll contact you with results.

## 2013-08-20 NOTE — Patient Instructions (Addendum)
good diet and exercise habits significanly improve the control of your diabetes.  please let me know if you wish to be referred to a dietician.  high blood sugar is very risky to your health.  you should see an eye doctor and dentist every year.  You are at higher than average risk for pneumonia and hepatitis-B.  You should be vaccinated against both.   controlling your blood pressure and cholesterol drastically reduces the damage diabetes does to your body.  this also applies to quitting smoking.  please discuss these with your doctor.  check your blood sugar twice a day.  vary the time of day when you check, between before the 3 meals, and at bedtime.  also check if you have symptoms of your blood sugar being too high or too low.  please keep a record of the readings and bring it to your next appointment here.  You can write it on any piece of paper.  please call us sooner if your blood sugar goes below 70, or if you have a lot of readings over 200. You can stop the byetta. Change lantus to NPH, 40 units each morning. Please come back for a follow-up appointment in 3 months.   blood tests are being requested for you today.  We'll contact you with results.

## 2013-08-25 ENCOUNTER — Other Ambulatory Visit: Payer: Self-pay | Admitting: Family Medicine

## 2013-08-25 LAB — BASIC METABOLIC PANEL
BUN: 37 mg/dL — ABNORMAL HIGH (ref 6–23)
CALCIUM: 9.7 mg/dL (ref 8.4–10.5)
CO2: 31 mEq/L (ref 19–32)
CREATININE: 1.48 mg/dL — AB (ref 0.50–1.10)
Chloride: 96 mEq/L (ref 96–112)
Glucose, Bld: 222 mg/dL — ABNORMAL HIGH (ref 70–99)
Potassium: 2.8 mEq/L — ABNORMAL LOW (ref 3.5–5.3)
Sodium: 139 mEq/L (ref 135–145)

## 2013-08-26 ENCOUNTER — Telehealth: Payer: Self-pay | Admitting: Family Medicine

## 2013-08-26 DIAGNOSIS — E876 Hypokalemia: Secondary | ICD-10-CM

## 2013-08-26 LAB — CREATININE WITH EST GFR
Creat: 1.56 mg/dL — ABNORMAL HIGH (ref 0.50–1.10)
GFR, Est African American: 40 mL/min — ABNORMAL LOW
GFR, Est Non African American: 35 mL/min — ABNORMAL LOW

## 2013-08-26 MED ORDER — POTASSIUM CHLORIDE CRYS ER 10 MEQ PO TBCR: 30.0000 meq | EXTENDED_RELEASE_TABLET | Freq: Three times a day (TID) | ORAL | Status: DC

## 2013-08-26 MED ORDER — SPIRONOLACTONE 25 MG PO TABS: 25.0000 mg | ORAL_TABLET | Freq: Every day | ORAL | Status: DC

## 2013-08-26 NOTE — Telephone Encounter (Signed)
Spoke directly with the pt, she will take 4 tabs this morning, currently taking 120 meq daily, will spk with nephrology as to where we go from here

## 2013-08-26 NOTE — Telephone Encounter (Signed)
Direct contact made, he recommends an additional 20 meq daily of potassium and spironolactone 25 mg one daily, will need to rept in 2 days, may need to reduce an antihypertensive med, she will be in the office this pm with her spouse will adress at that time and sched close f/u here

## 2013-08-26 NOTE — Telephone Encounter (Signed)
Patient in to office.  Patient given instructions for Potassium and Spironolactone.  Scheduled appointment for 2 weeks from now.  Spironolactone 25mg  qd and KCL 10MEQ 3 TID sent to [facility].  Patient given repeat lab orders for 7/31 STAT and 8/5.

## 2013-08-26 NOTE — Telephone Encounter (Signed)
Noted, thanks!

## 2013-08-27 LAB — HM DIABETES EYE EXAM

## 2013-08-28 LAB — BASIC METABOLIC PANEL
BUN: 42 mg/dL — AB (ref 6–23)
CHLORIDE: 100 meq/L (ref 96–112)
CO2: 29 meq/L (ref 19–32)
Calcium: 9.7 mg/dL (ref 8.4–10.5)
Creat: 1.54 mg/dL — ABNORMAL HIGH (ref 0.50–1.10)
Glucose, Bld: 241 mg/dL — ABNORMAL HIGH (ref 70–99)
Potassium: 3.8 mEq/L (ref 3.5–5.3)
Sodium: 142 mEq/L (ref 135–145)

## 2013-08-31 ENCOUNTER — Telehealth: Payer: Self-pay

## 2013-08-31 NOTE — Telephone Encounter (Signed)
Pt advised. She states she will schedule with Vaughan Basta.

## 2013-08-31 NOTE — Telephone Encounter (Signed)
Pt called stating she is having issues with her injection sites. Pt is currently injecting in her legs. Pt confirms she is rotating site appropriately.Pt states that when she injects her skin around the injection site become knotted up and and very painful. Pt just started Humulin N on Wednesday of this past week. She states she has not had issues like this in the past and did not know if it was due to the insulin. Pt states she did not want to start injecting into her stomach due to the issues she is having with her legs. Please advise during Dr. Cordelia Pen absence. Thanks!

## 2013-08-31 NOTE — Telephone Encounter (Signed)
Please have her come and see Vaughan Basta

## 2013-09-02 ENCOUNTER — Encounter: Payer: Medicare Other | Attending: Endocrinology | Admitting: Nutrition

## 2013-09-02 ENCOUNTER — Telehealth: Payer: Self-pay | Admitting: Endocrinology

## 2013-09-02 DIAGNOSIS — N058 Unspecified nephritic syndrome with other morphologic changes: Secondary | ICD-10-CM | POA: Diagnosis not present

## 2013-09-02 DIAGNOSIS — Z794 Long term (current) use of insulin: Secondary | ICD-10-CM | POA: Insufficient documentation

## 2013-09-02 DIAGNOSIS — E1121 Type 2 diabetes mellitus with diabetic nephropathy: Secondary | ICD-10-CM

## 2013-09-02 DIAGNOSIS — E1129 Type 2 diabetes mellitus with other diabetic kidney complication: Secondary | ICD-10-CM | POA: Insufficient documentation

## 2013-09-02 LAB — BASIC METABOLIC PANEL
BUN: 41 mg/dL — AB (ref 6–23)
CHLORIDE: 102 meq/L (ref 96–112)
CO2: 26 mEq/L (ref 19–32)
CREATININE: 1.54 mg/dL — AB (ref 0.50–1.10)
Calcium: 9.3 mg/dL (ref 8.4–10.5)
Glucose, Bld: 240 mg/dL — ABNORMAL HIGH (ref 70–99)
Potassium: 3.9 mEq/L (ref 3.5–5.3)
Sodium: 138 mEq/L (ref 135–145)

## 2013-09-02 NOTE — Telephone Encounter (Signed)
This patient was started on NPH insulin last week--40u q AM.  She is having small hardened areas under the skin that are itching and are present for 3-5 days.  I am able to palpate 4 hardened area, where she has said that she has injected her NPH.  FBSs 189-200s,  AcS: 130-170. Please advise

## 2013-09-02 NOTE — Patient Instructions (Signed)
Take 20u of Humalog 75/25 10 min. Before breakfast and supper. Call if blood sugars drop low Make an appt to see Dr. Loanne Drilling next week and bring meter.

## 2013-09-02 NOTE — Progress Notes (Signed)
Pt. Reported having started the NPH insulin  "40 u q AM last week.  She immediately noticed hard areas under the skin where she injected, that remained there for 3-5 days.  These sites also itched   I was also to palpate 4 small hard areas where she said she has injected the NPH.  She reports that they eventually will go away, by are "very uncomfortable".    Dr. Dwyane Dee recommend that she change to Humalog 75/25 ,and she was given a vial of this, and a copay card for a free vial.  She was told to take 20u acB and acS.  Written instructions were given to her for this.   She was also told to make an appt. With Dr. Loanne Drilling for next week, when he returns,  She was told to call if blood sugars drop low

## 2013-09-03 NOTE — Telephone Encounter (Signed)
She can try Humalog mix 75/25, 20 units twice a day to see if it'll cause less nodules

## 2013-09-14 ENCOUNTER — Ambulatory Visit (INDEPENDENT_AMBULATORY_CARE_PROVIDER_SITE_OTHER): Payer: Medicare Other | Admitting: Family Medicine

## 2013-09-14 ENCOUNTER — Encounter (INDEPENDENT_AMBULATORY_CARE_PROVIDER_SITE_OTHER): Payer: Self-pay

## 2013-09-14 ENCOUNTER — Encounter: Payer: Self-pay | Admitting: Family Medicine

## 2013-09-14 VITALS — BP 140/72 | HR 76 | Resp 18 | Ht 63.0 in | Wt 205.0 lb

## 2013-09-14 DIAGNOSIS — E1121 Type 2 diabetes mellitus with diabetic nephropathy: Secondary | ICD-10-CM

## 2013-09-14 DIAGNOSIS — E042 Nontoxic multinodular goiter: Secondary | ICD-10-CM

## 2013-09-14 DIAGNOSIS — N183 Chronic kidney disease, stage 3 unspecified: Secondary | ICD-10-CM

## 2013-09-14 DIAGNOSIS — E669 Obesity, unspecified: Secondary | ICD-10-CM

## 2013-09-14 DIAGNOSIS — N058 Unspecified nephritic syndrome with other morphologic changes: Secondary | ICD-10-CM

## 2013-09-14 DIAGNOSIS — E1129 Type 2 diabetes mellitus with other diabetic kidney complication: Secondary | ICD-10-CM

## 2013-09-14 DIAGNOSIS — I10 Essential (primary) hypertension: Secondary | ICD-10-CM

## 2013-09-14 DIAGNOSIS — E781 Pure hyperglyceridemia: Secondary | ICD-10-CM

## 2013-09-14 DIAGNOSIS — I639 Cerebral infarction, unspecified: Secondary | ICD-10-CM

## 2013-09-14 DIAGNOSIS — E785 Hyperlipidemia, unspecified: Secondary | ICD-10-CM

## 2013-09-14 DIAGNOSIS — I6350 Cerebral infarction due to unspecified occlusion or stenosis of unspecified cerebral artery: Secondary | ICD-10-CM

## 2013-09-14 MED ORDER — POTASSIUM CHLORIDE CRYS ER 10 MEQ PO TBCR: 30.0000 meq | EXTENDED_RELEASE_TABLET | Freq: Three times a day (TID) | ORAL | Status: DC

## 2013-09-14 MED ORDER — EZETIMIBE 10 MG PO TABS: 10.0000 mg | ORAL_TABLET | Freq: Every day | ORAL | Status: DC

## 2013-09-14 MED ORDER — SPIRONOLACTONE 25 MG PO TABS: 25.0000 mg | ORAL_TABLET | Freq: Every day | ORAL | Status: DC

## 2013-09-14 NOTE — Patient Instructions (Signed)
Annual wellness in early December , call if you need me before  Continue medications for blood pressure, spironolactone and potassium are refilled for 5 months  Pls continue to work on weight loss and regular exercise, blood pressure is still a little too high  Cholesterol is good but triglycerides are high, new additional medication zetia 10 m,g will  Be sent in anmd you will get a $25  Coupon, CALL and let us knwo if you are unable to use this please  Fasting lipid, cmp and EGFR and TSH  in Dec  You are referred for [place] of thyroid gland

## 2013-09-20 ENCOUNTER — Encounter: Payer: Self-pay | Admitting: Family Medicine

## 2013-09-20 DIAGNOSIS — E042 Nontoxic multinodular goiter: Secondary | ICD-10-CM | POA: Insufficient documentation

## 2013-09-20 NOTE — Progress Notes (Signed)
   Subjective:    Patient ID: Krista Strickland, female    DOB: 10-Jul-1950, 63 y.o.   MRN: [id number]  HPI The PT is here for follow up and re-evaluation of chronic medical conditions, medication management and review of any available recent lab and radiology data. She is specifically here to re evaluate her blood pressure and potassium levels on new addition of spironolactone. She denies intolerance of the spironolactone and feels well Preventive health is updated, specifically  Cancer screening and Immunization.   She has established with a new endo per her decision The PT denies any adverse reactions to current medications since the last visit.  There are no new concerns.  There are no specific complaints       Review of Systems See HPI Denies recent fever or chills. Denies sinus pressure, nasal congestion, ear pain or sore throat. Denies chest congestion, productive cough or wheezing. Denies chest pains, palpitations and leg swelling Denies abdominal pain, nausea, vomiting,diarrhea or constipation.   Denies dysuria, frequency, hesitancy or incontinence. Denies headaches, seizures, numbness, or tingling. Denies depression, anxiety or insomnia. Denies skin break down or rash.         Objective:   Physical Exam BP 140/72  Pulse 76  Resp 18  Ht 5\' 3"  (1.6 m)  Wt 205 lb (92.987 kg)  BMI 36.32 kg/m2  SpO2 96% Patient alert and oriented and in no cardiopulmonary distress.  HEENT: No facial asymmetry, EOMI,   oropharynx pink and moist.  Neck supple no JVD, no mass.  Chest: Clear to auscultation bilaterally.  CVS: S1, S2 no murmurs, no S3.Regular rate.  ABD: Soft non tender.   Ext: No edema  MS: Adequate ROM spine, shoulders, hips and knees.  Skin: Intact, no ulcerations or rash noted.  Psych: Good eye contact, normal affect. Memory intact not anxious or depressed appearing.  CNS: CN 2-12 intact, power,  normal throughout.no focal deficits noted.           Assessment & Plan:  HYPERTENSION uncontrolled DASH diet and commitment to daily physical activity for a minimum of 30 minutes discussed and encouraged, as a part of hypertension management. The importance of attaining a healthy weight is also discussed. No med change at [facility] visit Recent labs reveal normal potassium. Main concern for voisit os to ensure that potassium remains normal and blood pressure is not overcorrected  Type 2 diabetes mellitus with diabetic nephropathy Pt has electively re established with new Provider, and has seen Dr Loanne Drilling once. She intends to remain in his care , and is now ab;le to afford her meds Continued close endo f/u is strongly advised, and she agrees to this  CVA (cerebral vascular accident) No residual motor  or sensory loss  CHRONIC KIDNEY DISEASE STAGE III (MODERATE) Pt aware of need to avoid NSAIDS and to continue to follow with nephrology  HYPERLIPIDEMIA uncontrolled with elevated TG, coupon for zetia   provided, hopefully this will work Hyperlipidemia:Low fat diet discussed and encouraged.  Updated lab needed at/ before next visit.   Multinodular goiter (nontoxic) Updated thyroid US, repeat US for re eval also lab update  OBESITY Improved. Pt applauded on succesful weight loss through lifestyle change, and encouraged to continue same. Weight loss goal set for the next several months.

## 2013-09-20 NOTE — Assessment & Plan Note (Signed)
Updated thyroid US, repeat US for re eval also lab update

## 2013-09-20 NOTE — Assessment & Plan Note (Signed)
No residual motor  or sensory loss

## 2013-09-20 NOTE — Assessment & Plan Note (Signed)
uncontrolled DASH diet and commitment to daily physical activity for a minimum of 30 minutes discussed and encouraged, as a part of hypertension management. The importance of attaining a healthy weight is also discussed. No med change at [facility] visit Recent labs reveal normal potassium. Main concern for voisit os to ensure that potassium remains normal and blood pressure is not overcorrected

## 2013-09-20 NOTE — Assessment & Plan Note (Signed)
Pt has electively re established with new Provider, and has seen Dr Loanne Drilling once. She intends to remain in his care , and is now ab;le to afford her meds Continued close endo f/u is strongly advised, and she agrees to this

## 2013-09-20 NOTE — Assessment & Plan Note (Signed)
uncontrolled with elevated TG, coupon for zetia   provided, hopefully this will work Hyperlipidemia:Low fat diet discussed and encouraged.  Updated lab needed at/ before next visit.

## 2013-09-20 NOTE — Assessment & Plan Note (Signed)
Improved. Pt applauded on succesful weight loss through lifestyle change, and encouraged to continue same. Weight loss goal set for the next several months.  

## 2013-09-20 NOTE — Assessment & Plan Note (Signed)
Pt aware of need to avoid NSAIDS and to continue to follow with nephrology

## 2013-09-21 ENCOUNTER — Telehealth: Payer: Self-pay | Admitting: Endocrinology

## 2013-09-21 ENCOUNTER — Other Ambulatory Visit: Payer: Self-pay | Admitting: Family Medicine

## 2013-09-21 NOTE — Telephone Encounter (Signed)
Pt advised, to start alternating injection sites. Pt states she would. While on the phone pt lat blood sugar readings. Pt states on 8/22 at breakfast 200, at lunch 190 and supper 213. On 2/23 at breakfast 214, at lunch 200 and at supper 181. Pt states that she is taking 20 units of Humulin every morning.  Please advise, Thanks!

## 2013-09-21 NOTE — Telephone Encounter (Signed)
Pt is having swelling and rash since starting humulin around the insulin injection site

## 2013-09-21 NOTE — Telephone Encounter (Signed)
Please increase to 30 units qam

## 2013-09-21 NOTE — Telephone Encounter (Signed)
There are alternative, but they are much more expensive.  For now, try injecting half into 1 area, and half into another area.

## 2013-09-21 NOTE — Telephone Encounter (Signed)
See below and please advise, Thanks!  

## 2013-09-22 NOTE — Telephone Encounter (Signed)
Pt advised of increase in medication. Pt to keep appointment as scheduled for 8.26.2015

## 2013-09-23 ENCOUNTER — Encounter: Payer: Self-pay | Admitting: Endocrinology

## 2013-09-23 ENCOUNTER — Other Ambulatory Visit: Payer: Self-pay

## 2013-09-23 ENCOUNTER — Ambulatory Visit (INDEPENDENT_AMBULATORY_CARE_PROVIDER_SITE_OTHER): Payer: Medicare Other | Admitting: Endocrinology

## 2013-09-23 VITALS — BP 126/66 | HR 58 | Temp 97.9°F | Ht 63.0 in | Wt 205.0 lb

## 2013-09-23 DIAGNOSIS — E1129 Type 2 diabetes mellitus with other diabetic kidney complication: Secondary | ICD-10-CM

## 2013-09-23 DIAGNOSIS — E1121 Type 2 diabetes mellitus with diabetic nephropathy: Secondary | ICD-10-CM

## 2013-09-23 DIAGNOSIS — N058 Unspecified nephritic syndrome with other morphologic changes: Secondary | ICD-10-CM

## 2013-09-23 MED ORDER — INSULIN DETEMIR 100 UNIT/ML FLEXPEN: PEN_INJECTOR | SUBCUTANEOUS | Status: DC

## 2013-09-23 MED ORDER — INSULIN DETEMIR 100 UNIT/ML ~~LOC~~ SOLN: 60.0000 [IU] | Freq: Every day | SUBCUTANEOUS | Status: DC

## 2013-09-23 NOTE — Patient Instructions (Addendum)
check your blood sugar twice a day.  vary the time of day when you check, between before the 3 meals, and at bedtime.  also check if you have symptoms of your blood sugar being too high or too low.  please keep a record of the readings and bring it to your next appointment here.  You can write it on any piece of paper.  please call us sooner if your blood sugar goes below 70, or if you have a lot of readings over 200. Change insulin to levemir, 60 units each morning.   Please come back for a follow-up appointment in 2 weeks.

## 2013-09-23 NOTE — Progress Notes (Signed)
Subjective:    Patient ID: Krista Strickland, female    DOB: 06-25-1950, 63 y.o.   MRN: [id number]  HPI pt returns for f/u of insulin-requiring DM (dx'ed 2005; she has moderate neuropathy of the lower extremities, renal insufficiency and CVA; she has been on insulin since 2010; she has never had GDM, pancreatitis, severe hypoglycemia or DKA).she brings a record of her cbg's which i have reviewed today.  It varies from 150-200.  There is no trend throughout the day.   She developed slight "nodules" under the skin of the abdomen, and assoc itching.  She was changed to humalog 75/25, but she developed the same problems.  Past Medical History  Diagnosis Date  . Hyperlipidemia   . Obesity   . Diabetes mellitus, type 2   . Hypertension   . History of MRSA infection 03/2009    on ant abdomen   . Chronic kidney disease   . Gout   . Psoriasis   . Stroke   . Coronary artery disease   . GERD (gastroesophageal reflux disease)     Past Surgical History  Procedure Laterality Date  . Partial hysterectomy    . Rt,. neck biopsy    . Excison of rt breast cyst    . Appendectomy  2012  . Laparoscopic salpingo oopherectomy Right 04/22/2012    Procedure: LAPAROSCOPIC SALPINGO OOPHORECTOMY;  Surgeon: Jonnie Kind, MD;  Location: [facility];  Service: Gynecology;  Laterality: Right;  end 11:17  . Mass excision N/A 04/22/2012    Procedure: EXCISION SKIN TAGS NECK AND HEAD;  Surgeon: Jonnie Kind, MD;  Location: [facility];  Service: Gynecology;  Laterality: N/A;  start 11:19    History   Social History  . Marital Status: Married    Spouse Name: N/A    Number of Children: 1  . Years of Education: N/A   Occupational History  . disabled     Social History Main Topics  . Smoking status: Former Research scientist (life sciences)  . Smokeless tobacco: Not on file  . Alcohol Use: No  . Drug Use: No  . Sexual Activity: Yes   Other Topics Concern  . Not on file   Social History Narrative  . No narrative on file    Current  Outpatient Prescriptions on File Prior to Visit  Medication Sig Dispense Refill  . allopurinol (ZYLOPRIM) 300 MG tablet Take 300 mg by mouth daily.        Marland Kitchen aspirin 325 MG tablet Take 325 mg by mouth daily.      . calcitRIOL (ROCALTROL) 0.25 MCG capsule Take 0.25 mcg by mouth every other day.       . cyclobenzaprine (FLEXERIL) 10 MG tablet Take 10 mg by mouth 3 (three) times daily as needed. Muscle Spasms      . diltiazem (DILACOR XR) 180 MG 24 hr capsule Take 1 capsule (180 mg total) by mouth daily.  30 capsule  5  . docusate sodium (COLACE) 100 MG capsule Take 100 mg by mouth daily as needed. Constipation      . ezetimibe (ZETIA) 10 MG tablet Take 1 tablet (10 mg total) by mouth daily.  30 tablet  5  . furosemide (LASIX) 40 MG tablet TAKE ONE TABLET BY MOUTH TWICE DAILY  60 tablet  2  . HYDROcodone-acetaminophen (NORCO) 7.5-325 MG per tablet Take 1 tablet by mouth every 6 (six) hours as needed. Pain      . metolazone (ZAROXOLYN) 5 MG tablet TAKE ONE  TABLET BY MOUTH ONCE DAILY  30 tablet  2  . metoprolol (LOPRESSOR) 100 MG tablet TAKE ONE TABLET BY MOUTH TWICE DAILY  60 tablet  2  . midodrine (PROAMATINE) 5 MG tablet Take 5 mg by mouth daily.      . potassium chloride (KLOR-CON M10) 10 MEQ tablet Take 3 tablets (30 mEq total) by mouth 3 (three) times daily.  270 tablet  4  . pravastatin (PRAVACHOL) 20 MG tablet TAKE ONE TABLET BY MOUTH ONCE DAILY AT BEDTIME  30 tablet  2  . spironolactone (ALDACTONE) 25 MG tablet Take 1 tablet (25 mg total) by mouth daily.  30 tablet  4  . VOLTAREN 1 % GEL Apply 4 g topically 3 (three) times daily.       No current facility-administered medications on file prior to visit.    Allergies  Allergen Reactions  . Benazepril Swelling  . Fish Allergy   . Metronidazole Hives  . Mobic [Meloxicam] Hives  . Penicillins Swelling  . Sulfonamide Derivatives Hives    Family History  Problem Relation Age of Onset  . Gout Sister   . Hypertension Sister   . Stroke  Sister   . Gout Brother   . Gout Brother   . Gout Brother   . Hypertension Brother   . Hypertension Brother   . Diabetes Neg Hx     BP 126/66  Pulse 58  Temp(Src) 97.9 F (36.6 C) (Oral)  Ht 5\' 3"  (1.6 m)  Wt 205 lb (92.987 kg)  BMI 36.32 kg/m2  SpO2 97%    Review of Systems She denies hypoglycemia and weight change.    Objective:   Physical Exam VITAL SIGNS:  See vs page GENERAL: no distress SKIN:  Insulin injection sites at the anterior abdomen are normal, except for a few ecchymoses.  EXTEMITIES: no deformity. no ulcer on the feet. feet are of normal color and temp. 1+ bilat leg edema  PULSES: dorsalis pedis intact bilat.  NEURO: sensation is intact to touch on the feet.    Lab Results  Component Value Date   HGBA1C 7.3* 08/20/2013      Assessment & Plan:  DM: moderate exacerbation. Skin irritation, new to me, possibly due to insulin   Patient is advised the following: Patient Instructions  check your blood sugar twice a day.  vary the time of day when you check, between before the 3 meals, and at bedtime.  also check if you have symptoms of your blood sugar being too high or too low.  please keep a record of the readings and bring it to your next appointment here.  You can write it on any piece of paper.  please call us sooner if your blood sugar goes below 70, or if you have a lot of readings over 200. Change insulin to levemir, 60 units each morning.   Please come back for a follow-up appointment in 2 weeks.

## 2013-09-24 ENCOUNTER — Ambulatory Visit (HOSPITAL_COMMUNITY)
Admission: RE | Admit: 2013-09-24 | Discharge: 2013-09-24 | Disposition: A | Discharge status: Home / Self Care | Payer: Medicare Other | Source: Ambulatory Visit | Attending: Family Medicine | Admitting: Family Medicine

## 2013-09-24 DIAGNOSIS — E042 Nontoxic multinodular goiter: Secondary | ICD-10-CM | POA: Diagnosis present

## 2013-09-25 ENCOUNTER — Telehealth: Payer: Self-pay | Admitting: Family Medicine

## 2013-09-25 DIAGNOSIS — M792 Neuralgia and neuritis, unspecified: Secondary | ICD-10-CM

## 2013-09-25 NOTE — Telephone Encounter (Signed)
Noted, will enter generic referral

## 2013-09-25 NOTE — Telephone Encounter (Signed)
Noted  

## 2013-10-19 ENCOUNTER — Ambulatory Visit (INDEPENDENT_AMBULATORY_CARE_PROVIDER_SITE_OTHER): Payer: Medicare Other | Admitting: Endocrinology

## 2013-10-19 ENCOUNTER — Encounter: Payer: Self-pay | Admitting: Endocrinology

## 2013-10-19 ENCOUNTER — Telehealth: Payer: Self-pay | Admitting: Endocrinology

## 2013-10-19 VITALS — BP 122/64 | HR 81 | Temp 98.5°F | Ht 63.0 in | Wt 202.0 lb

## 2013-10-19 DIAGNOSIS — E1169 Type 2 diabetes mellitus with other specified complication: Secondary | ICD-10-CM

## 2013-10-19 DIAGNOSIS — L84 Corns and callosities: Secondary | ICD-10-CM

## 2013-10-19 DIAGNOSIS — E11628 Type 2 diabetes mellitus with other skin complications: Secondary | ICD-10-CM

## 2013-10-19 MED ORDER — INSULIN DETEMIR 100 UNIT/ML FLEXPEN: 80.0000 [IU] | PEN_INJECTOR | SUBCUTANEOUS | Status: DC

## 2013-10-19 NOTE — Progress Notes (Signed)
Subjective:    Patient ID: Krista Strickland, female    DOB: 02-Dec-1950, 63 y.o.   MRN: [id number]  HPI Pt returns for f/u of diabetes mellitus:  DM type: insulin-requiring type 2 Dx'ed: [id number] Complications: sensory neuropathy of the lower extremities, renal insufficiency and CVA. Therapy: insulin since 2010 GDM: never DKA: never Severe hypoglycemia: never Pancreatitis: never Other info: she takes levemir, after cutaneous rxn to 2 other insulins.    Interval history: no cbg record, but states cbg's vary from 276-400.  There is no trend throughout the day.  She says she never mises the insulin.  pt states she feels well in general.  Pt was noted to have multinodular goiter in 2012 (bx was benign; he has been euthyroid).  Past Medical History  Diagnosis Date  . Hyperlipidemia   . Obesity   . Diabetes mellitus, type 2   . Hypertension   . History of MRSA infection 03/2009    on ant abdomen   . Chronic kidney disease   . Gout   . Psoriasis   . Stroke   . Coronary artery disease   . GERD (gastroesophageal reflux disease)     Past Surgical History  Procedure Laterality Date  . Partial hysterectomy    . Rt,. neck biopsy    . Excison of rt breast cyst    . Appendectomy  2012  . Laparoscopic salpingo oopherectomy Right 04/22/2012    Procedure: LAPAROSCOPIC SALPINGO OOPHORECTOMY;  Surgeon: Jonnie Kind, MD;  Location: [facility];  Service: Gynecology;  Laterality: Right;  end 11:17  . Mass excision N/A 04/22/2012    Procedure: EXCISION SKIN TAGS NECK AND HEAD;  Surgeon: Jonnie Kind, MD;  Location: [facility];  Service: Gynecology;  Laterality: N/A;  start 11:19    History   Social History  . Marital Status: Married    Spouse Name: N/A    Number of Children: 1  . Years of Education: N/A   Occupational History  . disabled     Social History Main Topics  . Smoking status: Former Research scientist (life sciences)  . Smokeless tobacco: Not on file  . Alcohol Use: No  . Drug Use: No  . Sexual  Activity: Yes   Other Topics Concern  . Not on file   Social History Narrative  . No narrative on file    Current Outpatient Prescriptions on File Prior to Visit  Medication Sig Dispense Refill  . allopurinol (ZYLOPRIM) 300 MG tablet Take 300 mg by mouth daily.        Marland Kitchen aspirin 325 MG tablet Take 325 mg by mouth daily.      . calcitRIOL (ROCALTROL) 0.25 MCG capsule Take 0.25 mcg by mouth every other day.       . cyclobenzaprine (FLEXERIL) 10 MG tablet Take 10 mg by mouth 3 (three) times daily as needed. Muscle Spasms      . diltiazem (DILACOR XR) 180 MG 24 hr capsule Take 1 capsule (180 mg total) by mouth daily.  30 capsule  5  . docusate sodium (COLACE) 100 MG capsule Take 100 mg by mouth daily as needed. Constipation      . ezetimibe (ZETIA) 10 MG tablet Take 1 tablet (10 mg total) by mouth daily.  30 tablet  5  . furosemide (LASIX) 40 MG tablet TAKE ONE TABLET BY MOUTH TWICE DAILY  60 tablet  2  . HYDROcodone-acetaminophen (NORCO) 7.5-325 MG per tablet Take 1 tablet by mouth every 6 (six) hours  as needed. Pain      . metolazone (ZAROXOLYN) 5 MG tablet TAKE ONE TABLET BY MOUTH ONCE DAILY  30 tablet  2  . metoprolol (LOPRESSOR) 100 MG tablet TAKE ONE TABLET BY MOUTH TWICE DAILY  60 tablet  2  . midodrine (PROAMATINE) 5 MG tablet Take 5 mg by mouth daily.      . potassium chloride (KLOR-CON M10) 10 MEQ tablet Take 3 tablets (30 mEq total) by mouth 3 (three) times daily.  270 tablet  4  . pravastatin (PRAVACHOL) 20 MG tablet TAKE ONE TABLET BY MOUTH ONCE DAILY AT BEDTIME  30 tablet  2  . spironolactone (ALDACTONE) 25 MG tablet Take 1 tablet (25 mg total) by mouth daily.  30 tablet  4  . VOLTAREN 1 % GEL Apply 4 g topically 3 (three) times daily.       No current facility-administered medications on file prior to visit.    Allergies  Allergen Reactions  . Benazepril Swelling  . Fish Allergy   . Metronidazole Hives  . Mobic [Meloxicam] Hives  . Penicillins Swelling  . Sulfonamide  Derivatives Hives    Family History  Problem Relation Age of Onset  . Gout Sister   . Hypertension Sister   . Stroke Sister   . Gout Brother   . Gout Brother   . Gout Brother   . Hypertension Brother   . Hypertension Brother   . Diabetes Neg Hx     BP 122/64  Pulse 81  Temp(Src) 98.5 F (36.9 C) (Oral)  Ht 5\' 3"  (1.6 m)  Wt 202 lb (91.627 kg)  BMI 35.79 kg/m2  SpO2 97%  Review of Systems Denies n/v.  She denies hypoglycemia    Objective:   Physical Exam VITAL SIGNS:  See vs page GENERAL: no distress Pulses: dorsalis pedis intact bilat.   Feet: no deformity.  2+ bilat pedal edema Skin:  no ulcer on the feet.  normal color and temp. Neuro: sensation is intact to touch on the feet  Lab Results  Component Value Date   HGBA1C 7.3* 08/20/2013   Lab Results  Component Value Date   TSH 1.174 02/05/2011   (i reviewed thyroid US result)  i have reviewed the following outside records: Office notes    Assessment & Plan:  DM: moderate exacerbation.  Multinodular goiter, new to me, benign.  i'll follow her with serial ultrasound.     Patient is advised the following: Patient Instructions  check your blood sugar twice a day.  vary the time of day when you check, between before the 3 meals, and at bedtime.  also check if you have symptoms of your blood sugar being too high or too low.  please keep a record of the readings and bring it to your next appointment here.  You can write it on any piece of paper.  please call us sooner if your blood sugar goes below 70, or if you have a lot of readings over 200. Please increase levemir to 80 units each morning.   Please come back for a follow-up appointment in 2 weeks.

## 2013-10-19 NOTE — Telephone Encounter (Signed)
please call patient: Dr Moshe Cipro asked me to address your thyroid. My apologies that I did not address this on Monday. i have reviewed your records.  All you need for the thyroid is an annual blood test, and for a doctor to examine your neck once a year.  I would be happy to do this for you. i'll see you next time.

## 2013-10-19 NOTE — Patient Instructions (Addendum)
check your blood sugar twice a day.  vary the time of day when you check, between before the 3 meals, and at bedtime.  also check if you have symptoms of your blood sugar being too high or too low.  please keep a record of the readings and bring it to your next appointment here.  You can write it on any piece of paper.  please call us sooner if your blood sugar goes below 70, or if you have a lot of readings over 200. Please increase levemir to 80 units each morning.   Please come back for a follow-up appointment in 2 weeks.

## 2013-10-20 NOTE — Telephone Encounter (Signed)
Contacted pt and advised of below. Pt voiced understanding.

## 2013-10-25 ENCOUNTER — Other Ambulatory Visit: Payer: Self-pay | Admitting: Family Medicine

## 2013-11-02 ENCOUNTER — Other Ambulatory Visit: Payer: Self-pay

## 2013-11-02 ENCOUNTER — Encounter: Payer: Self-pay | Admitting: Endocrinology

## 2013-11-02 ENCOUNTER — Ambulatory Visit (INDEPENDENT_AMBULATORY_CARE_PROVIDER_SITE_OTHER): Payer: Medicare Other | Admitting: Endocrinology

## 2013-11-02 VITALS — BP 124/64 | HR 69 | Temp 98.8°F | Ht 63.0 in | Wt 204.0 lb

## 2013-11-02 DIAGNOSIS — E1121 Type 2 diabetes mellitus with diabetic nephropathy: Secondary | ICD-10-CM

## 2013-11-02 MED ORDER — INSULIN PEN NEEDLE 31G X 8 MM MISC: Status: DC

## 2013-11-02 MED ORDER — INSULIN DETEMIR 100 UNIT/ML FLEXPEN: 120.0000 [IU] | PEN_INJECTOR | SUBCUTANEOUS | Status: DC

## 2013-11-02 NOTE — Patient Instructions (Addendum)
check your blood sugar twice a day.  vary the time of day when you check, between before the 3 meals, and at bedtime.  also check if you have symptoms of your blood sugar being too high or too low.  please keep a record of the readings and bring it to your next appointment here.  You can write it on any piece of paper.  please call us sooner if your blood sugar goes below 70, or if you have a lot of readings over 200. Please increase levemir to 120 units each morning.   Please come back for a follow-up appointment in 2 weeks.   It would be cheaper to take NPH insulin.  Let me know if you want to take a test injection, here in the office, to see if the skin reaction happens again.

## 2013-11-02 NOTE — Progress Notes (Signed)
Subjective:    Patient ID: Krista Strickland, female    DOB: 11/23/1950, 63 y.o.   MRN: [id number]  HPI DM type: insulin-requiring type 2 Dx'ed: [id number] Complications: sensory neuropathy of the lower extremities, renal insufficiency and CVA. Therapy: insulin since 2010 GDM: never DKA: never Severe hypoglycemia: never Pancreatitis: never Other info: she takes levemir, after cutaneous rxn to 2 other insulins.    Interval history: no cbg record, but states cbg's vary from 231-387.  There is no trend throughout the day.  She says she never mises the insulin.  pt states she feels well in general.   Past Medical History  Diagnosis Date  . Hyperlipidemia   . Obesity   . Diabetes mellitus, type 2   . Hypertension   . History of MRSA infection 03/2009    on ant abdomen   . Chronic kidney disease   . Gout   . Psoriasis   . Stroke   . Coronary artery disease   . GERD (gastroesophageal reflux disease)     Past Surgical History  Procedure Laterality Date  . Partial hysterectomy    . Rt,. neck biopsy    . Excison of rt breast cyst    . Appendectomy  2012  . Laparoscopic salpingo oopherectomy Right 04/22/2012    Procedure: LAPAROSCOPIC SALPINGO OOPHORECTOMY;  Surgeon: Jonnie Kind, MD;  Location: [facility];  Service: Gynecology;  Laterality: Right;  end 11:17  . Mass excision N/A 04/22/2012    Procedure: EXCISION SKIN TAGS NECK AND HEAD;  Surgeon: Jonnie Kind, MD;  Location: [facility];  Service: Gynecology;  Laterality: N/A;  start 11:19    History   Social History  . Marital Status: Married    Spouse Name: N/A    Number of Children: 1  . Years of Education: N/A   Occupational History  . disabled     Social History Main Topics  . Smoking status: Former Research scientist (life sciences)  . Smokeless tobacco: Not on file  . Alcohol Use: No  . Drug Use: No  . Sexual Activity: Yes   Other Topics Concern  . Not on file   Social History Narrative  . No narrative on file    Current Outpatient  Prescriptions on File Prior to Visit  Medication Sig Dispense Refill  . allopurinol (ZYLOPRIM) 300 MG tablet Take 300 mg by mouth daily.        Marland Kitchen aspirin 325 MG tablet Take 325 mg by mouth daily.      . calcitRIOL (ROCALTROL) 0.25 MCG capsule Take 0.25 mcg by mouth every other day.       . cyclobenzaprine (FLEXERIL) 10 MG tablet Take 10 mg by mouth 3 (three) times daily as needed. Muscle Spasms      . diltiazem (DILACOR XR) 180 MG 24 hr capsule TAKE ONE CAPSULE BY MOUTH ONCE DAILY  30 capsule  2  . docusate sodium (COLACE) 100 MG capsule Take 100 mg by mouth daily as needed. Constipation      . ezetimibe (ZETIA) 10 MG tablet Take 1 tablet (10 mg total) by mouth daily.  30 tablet  5  . furosemide (LASIX) 40 MG tablet TAKE ONE TABLET BY MOUTH TWICE DAILY  60 tablet  2  . furosemide (LASIX) 40 MG tablet TAKE ONE TABLET BY MOUTH TWICE DAILY  60 tablet  2  . HYDROcodone-acetaminophen (NORCO) 7.5-325 MG per tablet Take 1 tablet by mouth every 6 (six) hours as needed. Pain      .  metolazone (ZAROXOLYN) 5 MG tablet TAKE ONE TABLET BY MOUTH ONCE DAILY  30 tablet  2  . metoprolol (LOPRESSOR) 100 MG tablet TAKE ONE TABLET BY MOUTH TWICE DAILY  60 tablet  2  . midodrine (PROAMATINE) 5 MG tablet Take 5 mg by mouth daily.      . potassium chloride (KLOR-CON M10) 10 MEQ tablet Take 3 tablets (30 mEq total) by mouth 3 (three) times daily.  270 tablet  4  . pravastatin (PRAVACHOL) 20 MG tablet TAKE ONE TABLET BY MOUTH ONCE DAILY AT BEDTIME  30 tablet  2  . spironolactone (ALDACTONE) 25 MG tablet Take 1 tablet (25 mg total) by mouth daily.  30 tablet  4  . VOLTAREN 1 % GEL Apply 4 g topically 3 (three) times daily.       No current facility-administered medications on file prior to visit.    Allergies  Allergen Reactions  . Benazepril Swelling  . Fish Allergy   . Metronidazole Hives  . Mobic [Meloxicam] Hives  . Penicillins Swelling  . Sulfonamide Derivatives Hives    Family History  Problem Relation  Age of Onset  . Gout Sister   . Hypertension Sister   . Stroke Sister   . Gout Brother   . Gout Brother   . Gout Brother   . Hypertension Brother   . Hypertension Brother   . Diabetes Neg Hx     BP 124/64  Pulse 69  Temp(Src) 98.8 F (37.1 C) (Oral)  Ht 5\' 3"  (1.6 m)  Wt 204 lb (92.534 kg)  BMI 36.15 kg/m2  SpO2 94%   Review of Systems she denies hypoglycemia and weight change    Objective:   Physical Exam VITAL SIGNS:  See vs page GENERAL: no distress Pulses: dorsalis pedis intact bilat.   Feet: no deformity.  no edema.   Skin:  no ulcer on the feet.  normal color and temp. Neuro: sensation is intact to touch on the feet.    Lab Results  Component Value Date   HGBA1C 7.3* 08/20/2013      Assessment & Plan:  DM: severe exacerbation. Skin rxn, by hx.    Patient is advised the following: Patient Instructions  check your blood sugar twice a day.  vary the time of day when you check, between before the 3 meals, and at bedtime.  also check if you have symptoms of your blood sugar being too high or too low.  please keep a record of the readings and bring it to your next appointment here.  You can write it on any piece of paper.  please call us sooner if your blood sugar goes below 70, or if you have a lot of readings over 200. Please increase levemir to 120 units each morning.   Please come back for a follow-up appointment in 2 weeks.   It would be cheaper to take NPH insulin.  Let me know if you want to take a test injection, here in the office, to see if the skin reaction happens again.

## 2013-11-10 ENCOUNTER — Ambulatory Visit: Payer: Medicare Other | Admitting: Endocrinology

## 2013-11-16 ENCOUNTER — Telehealth: Payer: Self-pay

## 2013-11-16 NOTE — Telephone Encounter (Signed)
Please increase to 160 units qam i'll see you in a few days

## 2013-11-16 NOTE — Telephone Encounter (Signed)
Pt called reporting blood sugars.  Friday:  Am: 315  Evening: 366   Saturday:  Am: 435  Mid morning: 453  Lunch: 575  Evening 544   Sunday:  Am: 331  Evening: 389   Pt confirmed that she is taking 120 units of levemir every morning. Please advise, thanks!

## 2013-11-16 NOTE — Telephone Encounter (Signed)
Pt advised of new instructions.

## 2013-11-20 ENCOUNTER — Ambulatory Visit (INDEPENDENT_AMBULATORY_CARE_PROVIDER_SITE_OTHER): Payer: Medicare Other | Admitting: Endocrinology

## 2013-11-20 VITALS — BP 122/68 | HR 80 | Temp 98.6°F | Ht 64.0 in | Wt 204.0 lb

## 2013-11-20 DIAGNOSIS — E1121 Type 2 diabetes mellitus with diabetic nephropathy: Secondary | ICD-10-CM

## 2013-11-20 MED ORDER — INSULIN DETEMIR 100 UNIT/ML FLEXPEN: 200.0000 [IU] | PEN_INJECTOR | SUBCUTANEOUS | Status: DC

## 2013-11-20 NOTE — Patient Instructions (Addendum)
check your blood sugar twice a day.  vary the time of day when you check, between before the 3 meals, and at bedtime.  also check if you have symptoms of your blood sugar being too high or too low.  please keep a record of the readings and bring it to your next appointment here.  You can write it on any piece of paper.  please call us sooner if your blood sugar goes below 70, or if you have a lot of readings over 200. Please increase levemir to 200 units each morning.  i have sent a prescription to your pharmacy.   Please call in 3 days, to tell us how the blood sugar is doing.  Please come back for a follow-up appointment in 2 weeks.

## 2013-11-20 NOTE — Progress Notes (Signed)
Subjective:    Patient ID: Krista Strickland, female    DOB: 05/18/1950, 63 y.o.   MRN: [id number]  HPI DM type: insulin-requiring type 2 Dx'ed: [id number] Complications: sensory neuropathy of the lower extremities, renal insufficiency and CVA. Therapy: insulin since 2010 GDM: never DKA: never Severe hypoglycemia: never Pancreatitis: never Other info: she takes levemir, after cutaneous rxn to 2 other insulins.   Interval history: she brings a record of her cbg's which i have reviewed today.  It varies from 271-466.  There is no trend throughout the day.  She says she never mises the insulin.  pt states she feels well in general.  She has slight redness at the anterior abdomen, in the context of insulin injections.  No assoc itching. The redness gets better in 1-2 days, after each injection.   Past Medical History  Diagnosis Date  . Hyperlipidemia   . Obesity   . Diabetes mellitus, type 2   . Hypertension   . History of MRSA infection 03/2009    on ant abdomen   . Chronic kidney disease   . Gout   . Psoriasis   . Stroke   . Coronary artery disease   . GERD (gastroesophageal reflux disease)     Past Surgical History  Procedure Laterality Date  . Partial hysterectomy    . Rt,. neck biopsy    . Excison of rt breast cyst    . Appendectomy  2012  . Laparoscopic salpingo oopherectomy Right 04/22/2012    Procedure: LAPAROSCOPIC SALPINGO OOPHORECTOMY;  Surgeon: Jonnie Kind, MD;  Location: [facility];  Service: Gynecology;  Laterality: Right;  end 11:17  . Mass excision N/A 04/22/2012    Procedure: EXCISION SKIN TAGS NECK AND HEAD;  Surgeon: Jonnie Kind, MD;  Location: [facility];  Service: Gynecology;  Laterality: N/A;  start 11:19    History   Social History  . Marital Status: Married    Spouse Name: N/A    Number of Children: 1  . Years of Education: N/A   Occupational History  . disabled     Social History Main Topics  . Smoking status: Former Research scientist (life sciences)  . Smokeless tobacco:  Not on file  . Alcohol Use: No  . Drug Use: No  . Sexual Activity: Yes   Other Topics Concern  . Not on file   Social History Narrative  . No narrative on file    Current Outpatient Prescriptions on File Prior to Visit  Medication Sig Dispense Refill  . allopurinol (ZYLOPRIM) 300 MG tablet Take 300 mg by mouth daily.        Marland Kitchen aspirin 325 MG tablet Take 325 mg by mouth daily.      . calcitRIOL (ROCALTROL) 0.25 MCG capsule Take 0.25 mcg by mouth every other day.       . cyclobenzaprine (FLEXERIL) 10 MG tablet Take 10 mg by mouth 3 (three) times daily as needed. Muscle Spasms      . diltiazem (DILACOR XR) 180 MG 24 hr capsule TAKE ONE CAPSULE BY MOUTH ONCE DAILY  30 capsule  2  . docusate sodium (COLACE) 100 MG capsule Take 100 mg by mouth daily as needed. Constipation      . ezetimibe (ZETIA) 10 MG tablet Take 1 tablet (10 mg total) by mouth daily.  30 tablet  5  . furosemide (LASIX) 40 MG tablet TAKE ONE TABLET BY MOUTH TWICE DAILY  60 tablet  2  . furosemide (LASIX) 40 MG tablet  TAKE ONE TABLET BY MOUTH TWICE DAILY  60 tablet  2  . HYDROcodone-acetaminophen (NORCO) 7.5-325 MG per tablet Take 1 tablet by mouth every 6 (six) hours as needed. Pain      . Insulin Pen Needle (RELION PEN NEEDLE 31G/8MM) 31G X 8 MM MISC Use to inject 3 times per day  100 each  2  . metolazone (ZAROXOLYN) 5 MG tablet TAKE ONE TABLET BY MOUTH ONCE DAILY  30 tablet  2  . metoprolol (LOPRESSOR) 100 MG tablet TAKE ONE TABLET BY MOUTH TWICE DAILY  60 tablet  2  . midodrine (PROAMATINE) 5 MG tablet Take 5 mg by mouth daily.      . potassium chloride (KLOR-CON M10) 10 MEQ tablet Take 3 tablets (30 mEq total) by mouth 3 (three) times daily.  270 tablet  4  . pravastatin (PRAVACHOL) 20 MG tablet TAKE ONE TABLET BY MOUTH ONCE DAILY AT BEDTIME  30 tablet  2  . spironolactone (ALDACTONE) 25 MG tablet Take 1 tablet (25 mg total) by mouth daily.  30 tablet  4  . VOLTAREN 1 % GEL Apply 4 g topically 3 (three) times daily.        No current facility-administered medications on file prior to visit.    Allergies  Allergen Reactions  . Benazepril Swelling  . Fish Allergy   . Metronidazole Hives  . Mobic [Meloxicam] Hives  . Penicillins Swelling  . Sulfonamide Derivatives Hives    Family History  Problem Relation Age of Onset  . Gout Sister   . Hypertension Sister   . Stroke Sister   . Gout Brother   . Gout Brother   . Gout Brother   . Hypertension Brother   . Hypertension Brother   . Diabetes Neg Hx    BP 122/68  Pulse 80  Temp(Src) 98.6 F (37 C) (Oral)  Ht 5\' 4"  (1.626 m)  Wt 204 lb (92.534 kg)  BMI 35.00 kg/m2  SpO2 94%  Review of Systems She denies hypoglycemia and n/v.      Objective:   Physical Exam VITAL SIGNS:  See vs page GENERAL: no distress SKIN:  Insulin injection sites at the anterior abdomen have slight erythema  Lab Results  Component Value Date   HGBA1C 7.3* 08/20/2013      Assessment & Plan:  DM: moderate exacerbation.  Erythema, apparently due to insulin injections, recurrent.  We'll follow for now.    Patient is advised the following: Patient Instructions  check your blood sugar twice a day.  vary the time of day when you check, between before the 3 meals, and at bedtime.  also check if you have symptoms of your blood sugar being too high or too low.  please keep a record of the readings and bring it to your next appointment here.  You can write it on any piece of paper.  please call us sooner if your blood sugar goes below 70, or if you have a lot of readings over 200. Please increase levemir to 200 units each morning.  i have sent a prescription to your pharmacy.   Please call in 3 days, to tell us how the blood sugar is doing.  Please come back for a follow-up appointment in 2 weeks.

## 2013-11-26 ENCOUNTER — Telehealth: Payer: Self-pay | Admitting: Endocrinology

## 2013-11-26 NOTE — Telephone Encounter (Signed)
please call patient: Increase levemir to 250 units qam. i'll see you next time.

## 2013-11-26 NOTE — Telephone Encounter (Signed)
Pt called about blood sugar readings.  11/23/2013  Am: 282   Pm 273   11/24/2013 Am: 242  Pm: 360  11/25/2013   Am: 326  Pm: 275   11/26/2013:  Am 328. Pt is currently taking 200 units of Levemir every morning. Please advise pt. Thanks!

## 2013-11-26 NOTE — Telephone Encounter (Signed)
Pt advised of below and voiced understanding. Pt to call at the beginning of next week to give sugar readings

## 2013-11-26 NOTE — Telephone Encounter (Signed)
Pt needs to relay blood sugar levels to Dr. Loanne Drilling. Please call asap

## 2013-11-30 ENCOUNTER — Encounter: Payer: Self-pay | Admitting: Endocrinology

## 2013-12-04 ENCOUNTER — Encounter: Payer: Self-pay | Admitting: Endocrinology

## 2013-12-04 ENCOUNTER — Ambulatory Visit (INDEPENDENT_AMBULATORY_CARE_PROVIDER_SITE_OTHER): Payer: Medicare Other | Admitting: Endocrinology

## 2013-12-04 VITALS — BP 138/70 | HR 70 | Temp 98.7°F | Ht 64.0 in | Wt 208.0 lb

## 2013-12-04 DIAGNOSIS — E1121 Type 2 diabetes mellitus with diabetic nephropathy: Secondary | ICD-10-CM

## 2013-12-04 MED ORDER — INSULIN DETEMIR 100 UNIT/ML FLEXPEN: 300.0000 [IU] | PEN_INJECTOR | SUBCUTANEOUS | Status: DC

## 2013-12-04 NOTE — Progress Notes (Signed)
Subjective:    Patient ID: Krista Strickland, female    DOB: 12-16-1950, 63 y.o.   MRN: [id number]  HPI DM type: insulin-requiring type 2 Dx'ed: [id number] Complications: sensory neuropathy of the lower extremities, renal insufficiency and CVA. Therapy: insulin since 2010 GDM: never DKA: never Severe hypoglycemia: never Pancreatitis: never Other info: she takes levemir, after cutaneous rxn to 2 other insulins.  She has requested qd insulin. Interval history: she brings a record of her cbg's which i have reviewed today.  It varies from 151-350.  It is in general higher as the day goes on.    Past Medical History  Diagnosis Date  . Hyperlipidemia   . Obesity   . Diabetes mellitus, type 2   . Hypertension   . History of MRSA infection 03/2009    on ant abdomen   . Chronic kidney disease   . Gout   . Psoriasis   . Stroke   . Coronary artery disease   . GERD (gastroesophageal reflux disease)     Past Surgical History  Procedure Laterality Date  . Partial hysterectomy    . Rt,. neck biopsy    . Excison of rt breast cyst    . Appendectomy  2012  . Laparoscopic salpingo oopherectomy Right 04/22/2012    Procedure: LAPAROSCOPIC SALPINGO OOPHORECTOMY;  Surgeon: Jonnie Kind, MD;  Location: [facility];  Service: Gynecology;  Laterality: Right;  end 11:17  . Mass excision N/A 04/22/2012    Procedure: EXCISION SKIN TAGS NECK AND HEAD;  Surgeon: Jonnie Kind, MD;  Location: [facility];  Service: Gynecology;  Laterality: N/A;  start 11:19    History   Social History  . Marital Status: Married    Spouse Name: N/A    Number of Children: 1  . Years of Education: N/A   Occupational History  . disabled     Social History Main Topics  . Smoking status: Former Research scientist (life sciences)  . Smokeless tobacco: Not on file  . Alcohol Use: No  . Drug Use: No  . Sexual Activity: Yes   Other Topics Concern  . Not on file   Social History Narrative    Current Outpatient Prescriptions on File Prior to Visit    Medication Sig Dispense Refill  . allopurinol (ZYLOPRIM) 300 MG tablet Take 300 mg by mouth daily.      Marland Kitchen aspirin 325 MG tablet Take 325 mg by mouth daily.    . calcitRIOL (ROCALTROL) 0.25 MCG capsule Take 0.25 mcg by mouth every other day.     . cyclobenzaprine (FLEXERIL) 10 MG tablet Take 10 mg by mouth 3 (three) times daily as needed. Muscle Spasms    . diltiazem (DILACOR XR) 180 MG 24 hr capsule TAKE ONE CAPSULE BY MOUTH ONCE DAILY 30 capsule 2  . docusate sodium (COLACE) 100 MG capsule Take 100 mg by mouth daily as needed. Constipation    . ezetimibe (ZETIA) 10 MG tablet Take 1 tablet (10 mg total) by mouth daily. 30 tablet 5  . furosemide (LASIX) 40 MG tablet TAKE ONE TABLET BY MOUTH TWICE DAILY 60 tablet 2  . furosemide (LASIX) 40 MG tablet TAKE ONE TABLET BY MOUTH TWICE DAILY 60 tablet 2  . HYDROcodone-acetaminophen (NORCO) 7.5-325 MG per tablet Take 1 tablet by mouth every 6 (six) hours as needed. Pain    . Insulin Pen Needle (RELION PEN NEEDLE 31G/8MM) 31G X 8 MM MISC Use to inject 3 times per day 100 each 2  .  metolazone (ZAROXOLYN) 5 MG tablet TAKE ONE TABLET BY MOUTH ONCE DAILY 30 tablet 2  . metoprolol (LOPRESSOR) 100 MG tablet TAKE ONE TABLET BY MOUTH TWICE DAILY 60 tablet 2  . midodrine (PROAMATINE) 5 MG tablet Take 5 mg by mouth daily.    . potassium chloride (KLOR-CON M10) 10 MEQ tablet Take 3 tablets (30 mEq total) by mouth 3 (three) times daily. 270 tablet 4  . pravastatin (PRAVACHOL) 20 MG tablet TAKE ONE TABLET BY MOUTH ONCE DAILY AT BEDTIME 30 tablet 2  . spironolactone (ALDACTONE) 25 MG tablet Take 1 tablet (25 mg total) by mouth daily. 30 tablet 4  . VOLTAREN 1 % GEL Apply 4 g topically 3 (three) times daily.     No current facility-administered medications on file prior to visit.    Allergies  Allergen Reactions  . Benazepril Swelling  . Fish Allergy   . Metronidazole Hives  . Mobic [Meloxicam] Hives  . Penicillins Swelling  . Sulfonamide Derivatives Hives     Family History  Problem Relation Age of Onset  . Gout Sister   . Hypertension Sister   . Stroke Sister   . Gout Brother   . Gout Brother   . Gout Brother   . Hypertension Brother   . Hypertension Brother   . Diabetes Neg Hx     BP 138/70 mmHg  Pulse 70  Temp(Src) 98.7 F (37.1 C) (Oral)  Ht 5\' 4"  (1.626 m)  Wt 208 lb (94.348 kg)  BMI 35.69 kg/m2  SpO2 96%    Review of Systems She denies hypoglycemia and weight change.    Objective:   Physical Exam VITAL SIGNS:  See vs page GENERAL: no distress Pulses: dorsalis pedis intact bilat.   Feet: no deformity.  1+ bilat leg and foot edema Skin:  no ulcer on the feet.  normal color and temp. Neuro: sensation is intact to touch on the feet       Assessment & Plan:  DM: she needs increased rx  Patient is advised the following: Patient Instructions  check your blood sugar twice a day.  vary the time of day when you check, between before the 3 meals, and at bedtime.  also check if you have symptoms of your blood sugar being too high or too low.  please keep a record of the readings and bring it to your next appointment here.  You can write it on any piece of paper.  please call us sooner if your blood sugar goes below 70, or if you have a lot of readings over 200. Please increase levemir to 300 units each morning.  i have sent a prescription to your pharmacy.   Please call next week, to tell us how the blood sugar is doing.    Please come back for a follow-up appointment in 1 month.

## 2013-12-04 NOTE — Patient Instructions (Addendum)
check your blood sugar twice a day.  vary the time of day when you check, between before the 3 meals, and at bedtime.  also check if you have symptoms of your blood sugar being too high or too low.  please keep a record of the readings and bring it to your next appointment here.  You can write it on any piece of paper.  please call us sooner if your blood sugar goes below 70, or if you have a lot of readings over 200. Please increase levemir to 300 units each morning.  i have sent a prescription to your pharmacy.   Please call next week, to tell us how the blood sugar is doing.    Please come back for a follow-up appointment in 1 month.

## 2013-12-04 NOTE — Progress Notes (Signed)
Pre visit review using our clinic review tool, if applicable. No additional management support is needed unless otherwise documented below in the visit note. 

## 2013-12-09 ENCOUNTER — Telehealth: Payer: Self-pay | Admitting: Endocrinology

## 2013-12-09 NOTE — Telephone Encounter (Signed)
See below, Pt is currently taking 300 units of Levemir each morning.  Please advise, Thanks!

## 2013-12-09 NOTE — Telephone Encounter (Addendum)
Patient B/S readings  Morning                  Evening 11/7    Sat    131     242 11/8    Sun   117     179 11/10  Tue   149     330 11/11  Wed 127

## 2013-12-09 NOTE — Telephone Encounter (Signed)
PT advised of note below and voiced understanding. Pt advised to call back in a few days to let us know how her sugars are doing.

## 2013-12-09 NOTE — Telephone Encounter (Signed)
Please increase to 330 units qam i'll see you next time.

## 2013-12-09 NOTE — Telephone Encounter (Signed)
Pt is returning your call

## 2013-12-09 NOTE — Telephone Encounter (Signed)
Requested call back to discuss.  

## 2013-12-14 ENCOUNTER — Other Ambulatory Visit: Payer: Self-pay | Admitting: Family Medicine

## 2013-12-14 DIAGNOSIS — Z1231 Encounter for screening mammogram for malignant neoplasm of breast: Secondary | ICD-10-CM

## 2013-12-14 LAB — LIPID PANEL
CHOLESTEROL: 113 mg/dL (ref 0–200)
HDL: 28 mg/dL — ABNORMAL LOW (ref >39)
LDL Cholesterol: 48 mg/dL (ref 0–99)
Total CHOL/HDL Ratio: 4 Ratio
Triglycerides: 187 mg/dL — ABNORMAL HIGH (ref <150)
VLDL: 37 mg/dL (ref 0–40)

## 2013-12-14 LAB — COMPLETE METABOLIC PANEL WITH GFR
ALBUMIN: 3.9 g/dL (ref 3.5–5.2)
ALK PHOS: 81 U/L (ref 39–117)
ALT: 39 U/L — ABNORMAL HIGH (ref 0–35)
AST: 24 U/L (ref 0–37)
BUN: 54 mg/dL — AB (ref 6–23)
CO2: 27 mEq/L (ref 19–32)
Calcium: 9.5 mg/dL (ref 8.4–10.5)
Chloride: 99 mEq/L (ref 96–112)
Creat: 1.91 mg/dL — ABNORMAL HIGH (ref 0.50–1.10)
GFR, Est African American: 32 mL/min — ABNORMAL LOW
GFR, Est Non African American: 27 mL/min — ABNORMAL LOW
Glucose, Bld: 167 mg/dL — ABNORMAL HIGH (ref 70–99)
Potassium: 4.5 mEq/L (ref 3.5–5.3)
Sodium: 137 mEq/L (ref 135–145)
Total Bilirubin: 0.4 mg/dL (ref 0.2–1.2)
Total Protein: 6.6 g/dL (ref 6.0–8.3)

## 2013-12-16 ENCOUNTER — Encounter: Payer: Self-pay | Admitting: Family Medicine

## 2013-12-16 ENCOUNTER — Encounter (INDEPENDENT_AMBULATORY_CARE_PROVIDER_SITE_OTHER): Payer: Self-pay

## 2013-12-16 ENCOUNTER — Ambulatory Visit (INDEPENDENT_AMBULATORY_CARE_PROVIDER_SITE_OTHER): Payer: Medicare Other | Admitting: Family Medicine

## 2013-12-16 ENCOUNTER — Ambulatory Visit (INDEPENDENT_AMBULATORY_CARE_PROVIDER_SITE_OTHER): Payer: Medicare Other

## 2013-12-16 ENCOUNTER — Other Ambulatory Visit (HOSPITAL_COMMUNITY)
Admission: RE | Admit: 2013-12-16 | Discharge: 2013-12-16 | Disposition: A | Discharge status: Home / Self Care | Payer: Medicare Other | Source: Ambulatory Visit | Attending: Family Medicine | Admitting: Family Medicine

## 2013-12-16 VITALS — BP 146/78 | HR 70 | Resp 18 | Ht 64.0 in | Wt 207.0 lb

## 2013-12-16 DIAGNOSIS — Z124 Encounter for screening for malignant neoplasm of cervix: Secondary | ICD-10-CM

## 2013-12-16 DIAGNOSIS — Z01419 Encounter for gynecological examination (general) (routine) without abnormal findings: Secondary | ICD-10-CM | POA: Diagnosis present

## 2013-12-16 DIAGNOSIS — Z1151 Encounter for screening for human papillomavirus (HPV): Secondary | ICD-10-CM | POA: Diagnosis present

## 2013-12-16 DIAGNOSIS — E1121 Type 2 diabetes mellitus with diabetic nephropathy: Secondary | ICD-10-CM

## 2013-12-16 DIAGNOSIS — Z23 Encounter for immunization: Secondary | ICD-10-CM | POA: Insufficient documentation

## 2013-12-16 DIAGNOSIS — E790 Hyperuricemia without signs of inflammatory arthritis and tophaceous disease: Secondary | ICD-10-CM

## 2013-12-16 DIAGNOSIS — Z1211 Encounter for screening for malignant neoplasm of colon: Secondary | ICD-10-CM

## 2013-12-16 DIAGNOSIS — G479 Sleep disorder, unspecified: Secondary | ICD-10-CM

## 2013-12-16 DIAGNOSIS — Z0000 Encounter for general adult medical examination without abnormal findings: Secondary | ICD-10-CM | POA: Insufficient documentation

## 2013-12-16 LAB — HEMOCCULT GUIAC POC 1CARD (OFFICE): Fecal Occult Blood, POC: NEGATIVE

## 2013-12-16 MED ORDER — PRAVASTATIN SODIUM 20 MG PO TABS: 20.0000 mg | ORAL_TABLET | Freq: Every day | ORAL | Status: DC

## 2013-12-16 MED ORDER — METOLAZONE 5 MG PO TABS: 5.0000 mg | ORAL_TABLET | Freq: Every day | ORAL | Status: DC

## 2013-12-16 MED ORDER — SPIRONOLACTONE 25 MG PO TABS: 25.0000 mg | ORAL_TABLET | Freq: Every day | ORAL | Status: DC

## 2013-12-16 MED ORDER — METOPROLOL TARTRATE 100 MG PO TABS: 100.0000 mg | ORAL_TABLET | Freq: Two times a day (BID) | ORAL | Status: DC

## 2013-12-16 NOTE — Assessment & Plan Note (Signed)
Vaccine administered at visit.  

## 2013-12-16 NOTE — Patient Instructions (Addendum)
F/u in  4 month, call if you need me before  Flu vaccine and TdAP today  Pls sign up for the diabetic class here, and also look into getting an individual appt with a nutrtionist for help with you diabetes, to make sure youa re eating as you should  Please try to commit to walking every day for a total of 30 minutes  Triglycerides and cholesterol are better  Examine feet every day since you do not feel as well as you should  You are referred to Dr Merlene Laughter to be evaluated for sleep apnea  Fasting lipid, cmp and EGFr, Uric acid level in 4 month

## 2013-12-18 LAB — CYTOLOGY - PAP

## 2014-01-04 ENCOUNTER — Other Ambulatory Visit: Payer: Self-pay

## 2014-01-04 ENCOUNTER — Encounter: Payer: Self-pay | Admitting: Endocrinology

## 2014-01-04 ENCOUNTER — Ambulatory Visit (INDEPENDENT_AMBULATORY_CARE_PROVIDER_SITE_OTHER): Payer: Medicare Other | Admitting: Endocrinology

## 2014-01-04 VITALS — BP 130/84 | HR 86 | Temp 99.2°F | Ht 63.0 in | Wt 208.0 lb

## 2014-01-04 DIAGNOSIS — E1121 Type 2 diabetes mellitus with diabetic nephropathy: Secondary | ICD-10-CM

## 2014-01-04 LAB — HEMOGLOBIN A1C: Hgb A1c MFr Bld: 10.9 % — ABNORMAL HIGH (ref 4.6–6.5)

## 2014-01-04 MED ORDER — INSULIN DETEMIR 100 UNIT/ML FLEXPEN: 330.0000 [IU] | PEN_INJECTOR | Freq: Every day | SUBCUTANEOUS | Status: DC

## 2014-01-04 MED ORDER — INSULIN PEN NEEDLE 31G X 8 MM MISC: Status: DC

## 2014-01-04 NOTE — Patient Instructions (Addendum)
check your blood sugar twice a day.  vary the time of day when you check, between before the 3 meals, and at bedtime.  also check if you have symptoms of your blood sugar being too high or too low.  please keep a record of the readings and bring it to your next appointment here.  You can write it on any piece of paper.  please call us sooner if your blood sugar goes below 70, or if you have a lot of readings over 200. Please continue the same insulin.  There is noting we can do about the redness at the insulin injection sites. blood tests are being requested for you today.  We'll let you know about the results. good diet and exercise habits significanly improve the control of your diabetes.  please let me know if you wish to be referred to a dietician.  Please come back for a follow-up appointment in 2 months.

## 2014-01-04 NOTE — Progress Notes (Signed)
Subjective:    Patient ID: Krista Strickland, female    DOB: February 23, 1950, 63 y.o.   MRN: [id number]  HPI DM type: insulin-requiring type 2 Dx'ed: [id number] Complications: sensory neuropathy of the lower extremities, renal insufficiency and CVA. Therapy: insulin since 2010 GDM: never DKA: never Severe hypoglycemia: never.   Pancreatitis: never. Other info: she takes levemir, after cutaneous reactions to NPH and lantus.  She has requested qd insulin.  Interval history: she brings a record of her cbg's which i have reviewed today.  It varies from 72-235.  It is in general higher as the day goes on.  Past Medical History  Diagnosis Date  . Hyperlipidemia   . Obesity   . Diabetes mellitus, type 2   . Hypertension   . History of MRSA infection 03/2009    on ant abdomen   . Chronic kidney disease   . Gout   . Psoriasis   . Stroke   . Coronary artery disease   . GERD (gastroesophageal reflux disease)     Past Surgical History  Procedure Laterality Date  . Partial hysterectomy    . Rt,. neck biopsy    . Excison of rt breast cyst    . Appendectomy  2012  . Laparoscopic salpingo oopherectomy Right 04/22/2012    Procedure: LAPAROSCOPIC SALPINGO OOPHORECTOMY;  Surgeon: Jonnie Kind, MD;  Location: [facility];  Service: Gynecology;  Laterality: Right;  end 11:17  . Mass excision N/A 04/22/2012    Procedure: EXCISION SKIN TAGS NECK AND HEAD;  Surgeon: Jonnie Kind, MD;  Location: [facility];  Service: Gynecology;  Laterality: N/A;  start 11:19    History   Social History  . Marital Status: Married    Spouse Name: N/A    Number of Children: 1  . Years of Education: N/A   Occupational History  . disabled     Social History Main Topics  . Smoking status: Former Research scientist (life sciences)  . Smokeless tobacco: Not on file  . Alcohol Use: No  . Drug Use: No  . Sexual Activity: Yes   Other Topics Concern  . Not on file   Social History Narrative    Current Outpatient Prescriptions on File Prior to  Visit  Medication Sig Dispense Refill  . allopurinol (ZYLOPRIM) 300 MG tablet Take 300 mg by mouth daily.      Marland Kitchen aspirin 325 MG tablet Take 325 mg by mouth daily.    . calcitRIOL (ROCALTROL) 0.25 MCG capsule Take 0.25 mcg by mouth every other day.     . cyclobenzaprine (FLEXERIL) 10 MG tablet Take 10 mg by mouth 3 (three) times daily as needed. Muscle Spasms    . diltiazem (DILACOR XR) 180 MG 24 hr capsule TAKE ONE CAPSULE BY MOUTH ONCE DAILY 30 capsule 2  . docusate sodium (COLACE) 100 MG capsule Take 100 mg by mouth daily as needed. Constipation    . ezetimibe (ZETIA) 10 MG tablet Take 1 tablet (10 mg total) by mouth daily. 30 tablet 5  . furosemide (LASIX) 40 MG tablet TAKE ONE TABLET BY MOUTH TWICE DAILY 60 tablet 2  . HYDROcodone-acetaminophen (NORCO) 7.5-325 MG per tablet Take 1 tablet by mouth every 6 (six) hours as needed. Pain    . metolazone (ZAROXOLYN) 5 MG tablet Take 1 tablet (5 mg total) by mouth daily. 30 tablet 2  . metoprolol (LOPRESSOR) 100 MG tablet Take 1 tablet (100 mg total) by mouth 2 (two) times daily. 60 tablet 2  .  midodrine (PROAMATINE) 5 MG tablet Take 5 mg by mouth daily.    . potassium chloride (KLOR-CON M10) 10 MEQ tablet Take 3 tablets (30 mEq total) by mouth 3 (three) times daily. 270 tablet 4  . pravastatin (PRAVACHOL) 20 MG tablet Take 1 tablet (20 mg total) by mouth daily with breakfast. 30 tablet 2  . spironolactone (ALDACTONE) 25 MG tablet Take 1 tablet (25 mg total) by mouth daily. 30 tablet 4  . VOLTAREN 1 % GEL Apply 4 g topically 3 (three) times daily.     No current facility-administered medications on file prior to visit.    Allergies  Allergen Reactions  . Benazepril Swelling  . Fish Allergy   . Metronidazole Hives  . Mobic [Meloxicam] Hives  . Penicillins Swelling  . Sulfonamide Derivatives Hives    Family History  Problem Relation Age of Onset  . Gout Sister   . Hypertension Sister   . Stroke Sister   . Gout Brother   . Gout Brother    . Gout Brother   . Hypertension Brother   . Hypertension Brother   . Diabetes Neg Hx     BP 130/84 mmHg  Pulse 86  Temp(Src) 99.2 F (37.3 C) (Oral)  Ht 5\' 3"  (1.6 m)  Wt 208 lb (94.348 kg)  BMI 36.85 kg/m2  SpO2 97%    Review of Systems She denies hypoglycemia.  She has gained a few lbs.    Objective:   Physical Exam VITAL SIGNS:  See vs page GENERAL: no distress Pulses: dorsalis pedis intact bilat.   Feet: no deformity. 1+ bilat leg and foot edema  Skin: no ulcer on the feet. normal color and temp.  Neuro: sensation is intact to touch on the feet SKIN:  Insulin injection sites at the anterior abdomen continue to have erythema.   Lab Results  Component Value Date   HGBA1C 10.9* 01/04/2014      Assessment & Plan:  DM: moderate exacerbation Cutaneous reaction to insulin injections, persistent.  This limits rx options, as the pattern of cbg's suggests she should take a faster-acting insulin.     Patient is advised the following: Patient Instructions  check your blood sugar twice a day.  vary the time of day when you check, between before the 3 meals, and at bedtime.  also check if you have symptoms of your blood sugar being too high or too low.  please keep a record of the readings and bring it to your next appointment here.  You can write it on any piece of paper.  please call us sooner if your blood sugar goes below 70, or if you have a lot of readings over 200. Please continue the same insulin.  There is noting we can do about the redness at the insulin injection sites. blood tests are being requested for you today.  We'll let you know about the results. good diet and exercise habits significanly improve the control of your diabetes.  please let me know if you wish to be referred to a dietician.  Please come back for a follow-up appointment in 2 months.     increase levemir to 370 units qam.

## 2014-01-05 ENCOUNTER — Telehealth: Payer: Self-pay | Admitting: Endocrinology

## 2014-01-05 MED ORDER — INSULIN DETEMIR 100 UNIT/ML FLEXPEN: 370.0000 [IU] | PEN_INJECTOR | SUBCUTANEOUS | Status: DC

## 2014-01-05 NOTE — Telephone Encounter (Signed)
Pt advised note below and voiced understanding.

## 2014-01-05 NOTE — Telephone Encounter (Signed)
please call patient: Blood sugar is high.  Please increase levemir to 370 units qam Please come back for a follow-up appointment in 6 weeks

## 2014-01-05 NOTE — Telephone Encounter (Signed)
Requested call back to discuss.  

## 2014-01-07 ENCOUNTER — Ambulatory Visit (HOSPITAL_COMMUNITY)
Admission: RE | Admit: 2014-01-07 | Discharge: 2014-01-07 | Disposition: A | Discharge status: Home / Self Care | Payer: Medicare Other | Source: Ambulatory Visit | Attending: Family Medicine | Admitting: Family Medicine

## 2014-01-07 DIAGNOSIS — Z1231 Encounter for screening mammogram for malignant neoplasm of breast: Secondary | ICD-10-CM | POA: Diagnosis present

## 2014-01-15 ENCOUNTER — Encounter: Payer: Medicare Other | Admitting: Family Medicine

## 2014-01-30 ENCOUNTER — Other Ambulatory Visit: Payer: Self-pay | Admitting: Family Medicine

## 2014-02-02 ENCOUNTER — Other Ambulatory Visit: Payer: Self-pay | Admitting: Family Medicine

## 2014-02-08 DIAGNOSIS — Z1211 Encounter for screening for malignant neoplasm of colon: Secondary | ICD-10-CM | POA: Insufficient documentation

## 2014-02-08 NOTE — Progress Notes (Signed)
   Subjective:    Patient ID: Krista Strickland, female    DOB: [birth year], 64 y.o.   MRN: [id number]  HPI Patient is in for pelvic and breast exam. Diabetic foot exam is performed Immunization needs addressed Pt is referred for re eval and management of sleep apnea Referred to local diabetic class in house and encouraged to seek individual conselling since her blood suagr is uncontrolled   Review of Systems See HPI     Objective:   Physical Exam BP 146/78 mmHg  Pulse 70  Resp 18  Ht 5\' 4"  (1.626 m)  Wt 207 lb (93.895 kg)  BMI 35.51 kg/m2  SpO2 92%  Pleasant well nourished female, alert and oriented x 3, in no cardio-pulmonary distress. Afebrile. HEENT No facial trauma or asymetry. Sinuses non tender.  Extra occullar muscles intact, pupils equally reactive to light. External ears normal, tympanic membranes clear. Oropharynx moist, no exudate, fairly good dentition. Neck: supple, no adenopathy,JVD or thyromegaly.No bruits.  Chest: Clear to ascultation bilaterally.No crackles or wheezes. Non tender to palpation  Breast: No asymetry,no masses or lumps. No tenderness. No nipple discharge or inversion. No axillary or supraclavicular adenopathy  Cardiovascular system; Heart sounds normal,  S1 and  S2 ,no S3.  No murmur, or thrill. Apical beat not displaced Peripheral pulses normal.  Abdomen: Soft, non tender, no organomegaly or masses. No bruits. Bowel sounds normal. No guarding, tenderness or rebound.  Rectal:  Normal sphincter tone. No mass.No rectal masses.  Guaiac negative stool.  GU: External genitalia normal female genitalia , female distribution of hair. No lesions. Urethral meatus normal in size, no  Prolapse, no lesions visibly  Present. Bladder non tender. Vagina pink and moist , with no visible lesions , discharge present . Adequate pelvic support no  cystocele or rectocele noted Uterus absent , no adnexal masses, no  adnexal  tenderness.   Musculoskeletal exam: Decreased though adequate ROM of spine, hips , shoulders and knees. No deformity ,swelling or crepitus noted. No muscle wasting or atrophy.   Neurologic: Cranial nerves 2 to 12 intact. Power, tone ,sensation and reflexes normal throughout. No disturbance in gait. No tremor.  Skin: Intact, no ulceration, erythema , scaling or rash noted. Pigmentation normal throughout  Psych; Normal mood and affect. Judgement and concentration normal         Assessment & Plan:  Need for prophylactic vaccination and inoculation against influenza Vaccine administered at visit.   Need for Tdap vaccination Vaccine administered at visit.   Annual physical exam Annual exam as documented. Counseling done  re healthy lifestyle involving commitment to 150 minutes exercise per week, heart healthy diet, and attaining healthy weight.The importance of adequate sleep also discussed. Regular seat belt use and home safety, is also discussed. Changes in health habits are decided on by the patient with goals and time frames  set for achieving them. Immunization and cancer screening needs are specifically addressed at this visit.   Special screening for malignant neoplasms, colon Heme negative stool, no mass

## 2014-02-08 NOTE — Assessment & Plan Note (Signed)
Heme negative stool, no mass 

## 2014-02-08 NOTE — Assessment & Plan Note (Signed)

## 2014-03-04 ENCOUNTER — Other Ambulatory Visit: Payer: Self-pay | Admitting: Family Medicine

## 2014-03-08 ENCOUNTER — Ambulatory Visit (INDEPENDENT_AMBULATORY_CARE_PROVIDER_SITE_OTHER): Payer: PPO | Admitting: Endocrinology

## 2014-03-08 ENCOUNTER — Encounter: Payer: Self-pay | Admitting: Endocrinology

## 2014-03-08 VITALS — BP 134/84 | HR 73 | Ht 63.0 in | Wt 220.0 lb

## 2014-03-08 DIAGNOSIS — E11628 Type 2 diabetes mellitus with other skin complications: Secondary | ICD-10-CM

## 2014-03-08 DIAGNOSIS — L84 Corns and callosities: Secondary | ICD-10-CM

## 2014-03-08 MED ORDER — INSULIN NPH (HUMAN) (ISOPHANE) 100 UNIT/ML ~~LOC~~ SUSP: 250.0000 [IU] | SUBCUTANEOUS | Status: DC

## 2014-03-08 NOTE — Progress Notes (Signed)
Subjective:    Patient ID: Krista Strickland, female    DOB: February 22, 1950, 64 y.o.   MRN: [id number]  HPI DM type: insulin-requiring type 2 Dx'ed: [id number] Complications: sensory neuropathy of the lower extremities, renal insufficiency and CVA. Therapy: insulin since 2010 GDM: never DKA: never Severe hypoglycemia: never.   Pancreatitis: never. Other info: she takes levemir, after cutaneous reactions to NPH and lantus.  She has requested qd insulin.   Interval history: Pt says she cannot afford the levemir any longer.  pt states she feels well in general.   no cbg record, but states cbg's vary from 100-200's.  There is no trend throughout the day.   Past Medical History  Diagnosis Date  . Hyperlipidemia   . Obesity   . Diabetes mellitus, type 2   . Hypertension   . History of MRSA infection 03/2009    on ant abdomen   . Chronic kidney disease   . Gout   . Psoriasis   . Stroke   . Coronary artery disease   . GERD (gastroesophageal reflux disease)     Past Surgical History  Procedure Laterality Date  . Partial hysterectomy    . Rt,. neck biopsy    . Excison of rt breast cyst    . Appendectomy  2012  . Laparoscopic salpingo oopherectomy Right 04/22/2012    Procedure: LAPAROSCOPIC SALPINGO OOPHORECTOMY;  Surgeon: Jonnie Kind, MD;  Location: [facility];  Service: Gynecology;  Laterality: Right;  end 11:17  . Mass excision N/A 04/22/2012    Procedure: EXCISION SKIN TAGS NECK AND HEAD;  Surgeon: Jonnie Kind, MD;  Location: [facility];  Service: Gynecology;  Laterality: N/A;  start 11:19    History   Social History  . Marital Status: Married    Spouse Name: N/A    Number of Children: 1  . Years of Education: N/A   Occupational History  . disabled     Social History Main Topics  . Smoking status: Former Research scientist (life sciences)  . Smokeless tobacco: Not on file  . Alcohol Use: No  . Drug Use: No  . Sexual Activity: Yes   Other Topics Concern  . Not on file   Social History Narrative     Current Outpatient Prescriptions on File Prior to Visit  Medication Sig Dispense Refill  . ACCU-CHEK SOFTCLIX LANCETS lancets USE LANCETS TO CHECK BLOOD SUGAR TWICE DAILY 100 each 2  . allopurinol (ZYLOPRIM) 300 MG tablet Take 300 mg by mouth daily.      Marland Kitchen aspirin 325 MG tablet Take 325 mg by mouth daily.    . calcitRIOL (ROCALTROL) 0.25 MCG capsule Take 0.25 mcg by mouth every other day.     . cyclobenzaprine (FLEXERIL) 10 MG tablet Take 10 mg by mouth 3 (three) times daily as needed. Muscle Spasms    . diltiazem (DILACOR XR) 180 MG 24 hr capsule TAKE ONE CAPSULE BY MOUTH ONCE DAILY 30 capsule 3  . docusate sodium (COLACE) 100 MG capsule Take 100 mg by mouth daily as needed. Constipation    . ezetimibe (ZETIA) 10 MG tablet Take 1 tablet (10 mg total) by mouth daily. 30 tablet 5  . furosemide (LASIX) 40 MG tablet TAKE ONE TABLET BY MOUTH TWICE DAILY 60 tablet 3  . HYDROcodone-acetaminophen (NORCO) 7.5-325 MG per tablet Take 1 tablet by mouth every 6 (six) hours as needed. Pain    . metolazone (ZAROXOLYN) 5 MG tablet Take 1 tablet (5 mg total) by mouth daily.  30 tablet 2  . metoprolol (LOPRESSOR) 100 MG tablet Take 1 tablet (100 mg total) by mouth 2 (two) times daily. 60 tablet 2  . midodrine (PROAMATINE) 5 MG tablet Take 5 mg by mouth daily.    . potassium chloride (K-DUR) 10 MEQ tablet TAKE THREE TABLETS BY MOUTH THREE TIMES DAILY 270 tablet 0  . potassium chloride (KLOR-CON M10) 10 MEQ tablet Take 3 tablets (30 mEq total) by mouth 3 (three) times daily. 270 tablet 4  . pravastatin (PRAVACHOL) 20 MG tablet Take 1 tablet (20 mg total) by mouth daily with breakfast. 30 tablet 2  . spironolactone (ALDACTONE) 25 MG tablet Take 1 tablet (25 mg total) by mouth daily. 30 tablet 4  . VOLTAREN 1 % GEL Apply 4 g topically 3 (three) times daily.     No current facility-administered medications on file prior to visit.    Allergies  Allergen Reactions  . Benazepril Swelling  . Fish Allergy   .  Metronidazole Hives  . Mobic [Meloxicam] Hives  . Penicillins Swelling  . Sulfonamide Derivatives Hives    Family History  Problem Relation Age of Onset  . Gout Sister   . Hypertension Sister   . Stroke Sister   . Gout Brother   . Gout Brother   . Gout Brother   . Hypertension Brother   . Hypertension Brother   . Diabetes Neg Hx     BP 134/84 mmHg  Pulse 73  Temp(Src)   Ht 5\' 3"  (1.6 m)  Wt 220 lb (99.791 kg)  BMI 38.98 kg/m2  SpO2 97%   Review of Systems She denies hypoglycemia.  She has gained weight.      Objective:   Physical Exam VITAL SIGNS:  See vs page.  GENERAL: no distress.  SKIN:  Insulin injection sites at the anterior abdomen are normal, except for a few ecchymoses.  Pulses: dorsalis pedis intact bilat.   MSK: no deformity of the feet CV: no leg edema Skin:  no ulcer on the feet.  normal color and temp on the feet. Neuro: sensation is intact to touch on the feet, but decreased from normal.      Assessment & Plan:  Morbid obesity, worse DM: moderate exacerbation.   Cutaneous reaction to insulin: due to financial factors, we have no choice but to re-try the NPH insulin.   Patient is advised the following: Patient Instructions  check your blood sugar twice a day.  vary the time of day when you check, between before the 3 meals, and at bedtime.  also check if you have symptoms of your blood sugar being too high or too low.  please keep a record of the readings and bring it to your next appointment here.  You can write it on any piece of paper.  please call us sooner if your blood sugar goes below 70, or if you have a lot of readings over 200. Please change to NPH insulin.  i have sent a prescription to your pharmacy.  If you have some skin irritation with this, please see how it goes.   Please come back for a follow-up appointment in 1 month.   Please let me know if you decide to consider the weight loss surgery.

## 2014-03-08 NOTE — Patient Instructions (Addendum)
check your blood sugar twice a day.  vary the time of day when you check, between before the 3 meals, and at bedtime.  also check if you have symptoms of your blood sugar being too high or too low.  please keep a record of the readings and bring it to your next appointment here.  You can write it on any piece of paper.  please call us sooner if your blood sugar goes below 70, or if you have a lot of readings over 200. Please change to NPH insulin.  i have sent a prescription to your pharmacy.  If you have some skin irritation with this, please see how it goes.   Please come back for a follow-up appointment in 1 month.   Please let me know if you decide to consider the weight loss surgery.

## 2014-03-12 ENCOUNTER — Telehealth: Payer: Self-pay | Admitting: Family Medicine

## 2014-03-12 NOTE — Telephone Encounter (Signed)
Pls let pt know that I see from Dr Cordelia Pen note that her blood sugar, continues to be uncontrolled, despite her best efforts, and that she is needing a lot of insulin Let her know that i agree with his suggestion of bariatric surgery to treat her diabetes. Offer info on how to attend the education sessions so that she can understand the process if she is  Interested. Let her know that this is not an attempt to pressure her into surgery that she does not want or understand the benefit of  For her. It is just adding support to recommendation by her endo Doc, because of her diabetes

## 2014-03-12 NOTE — Telephone Encounter (Signed)
Called and left message for patient to return call.  

## 2014-03-16 NOTE — Telephone Encounter (Signed)
Patient aware.  She will look for information.

## 2014-03-31 ENCOUNTER — Other Ambulatory Visit: Payer: Self-pay | Admitting: Family Medicine

## 2014-04-06 ENCOUNTER — Encounter: Payer: Self-pay | Admitting: Endocrinology

## 2014-04-06 ENCOUNTER — Ambulatory Visit (INDEPENDENT_AMBULATORY_CARE_PROVIDER_SITE_OTHER): Payer: PPO | Admitting: Endocrinology

## 2014-04-06 DIAGNOSIS — E1121 Type 2 diabetes mellitus with diabetic nephropathy: Secondary | ICD-10-CM

## 2014-04-06 LAB — HEMOGLOBIN A1C: Hgb A1c MFr Bld: 8.8 % — ABNORMAL HIGH (ref 4.6–6.5)

## 2014-04-06 MED ORDER — INSULIN NPH ISOPHANE & REGULAR (70-30) 100 UNIT/ML ~~LOC~~ SUSP: 250.0000 [IU] | Freq: Every day | SUBCUTANEOUS | Status: DC

## 2014-04-06 MED ORDER — ONETOUCH DELICA LANCETS 33G MISC: 1.0000 | Freq: Two times a day (BID) | Status: DC

## 2014-04-06 NOTE — Progress Notes (Signed)
Subjective:    Patient ID: Krista Strickland, female    DOB: [birth year], 64 y.o.   MRN: [id number]  HPI DM type: insulin-requiring type 2 Dx'ed: [id number] Complications: polyneuropathy, renal insufficiency and CVA. Therapy: insulin since 2010 GDM: never DKA: never Severe hypoglycemia: never.   Pancreatitis: never.   Other: she declines weight loss surgery; she takes levemir, after cutaneous reactions to NPH and lantus; she has requested qd insulin, but she cannot afford analogs, so we had to re-try NPH (no reaction this time).   Interval history: pt states she feels well in general.   no cbg record, but states cbg's vary from 76-236.  It is in general higher as the day goes on.  Pt says she never misses the insulin.   Past Medical History  Diagnosis Date  . Hyperlipidemia   . Obesity   . Diabetes mellitus, type 2   . Hypertension   . History of MRSA infection 03/2009    on ant abdomen   . Chronic kidney disease   . Gout   . Psoriasis   . Stroke   . Coronary artery disease   . GERD (gastroesophageal reflux disease)     Past Surgical History  Procedure Laterality Date  . Partial hysterectomy    . Rt,. neck biopsy    . Excison of rt breast cyst    . Appendectomy  2012  . Laparoscopic salpingo oopherectomy Right 04/22/2012    Procedure: LAPAROSCOPIC SALPINGO OOPHORECTOMY;  Surgeon: Jonnie Kind, MD;  Location: [facility];  Service: Gynecology;  Laterality: Right;  end 11:17  . Mass excision N/A 04/22/2012    Procedure: EXCISION SKIN TAGS NECK AND HEAD;  Surgeon: Jonnie Kind, MD;  Location: [facility];  Service: Gynecology;  Laterality: N/A;  start 11:19    History   Social History  . Marital Status: Married    Spouse Name: N/A  . Number of Children: 1  . Years of Education: N/A   Occupational History  . disabled     Social History Main Topics  . Smoking status: Former Research scientist (life sciences)  . Smokeless tobacco: Not on file  . Alcohol Use: No  . Drug Use: No  . Sexual Activity: Yes    Other Topics Concern  . Not on file   Social History Narrative    Current Outpatient Prescriptions on File Prior to Visit  Medication Sig Dispense Refill  . allopurinol (ZYLOPRIM) 300 MG tablet Take 300 mg by mouth daily.      Marland Kitchen aspirin 325 MG tablet Take 325 mg by mouth daily.    . calcitRIOL (ROCALTROL) 0.25 MCG capsule Take 0.25 mcg by mouth every other day.     . cyclobenzaprine (FLEXERIL) 10 MG tablet Take 10 mg by mouth 3 (three) times daily as needed. Muscle Spasms    . diltiazem (DILACOR XR) 180 MG 24 hr capsule TAKE ONE CAPSULE BY MOUTH ONCE DAILY 30 capsule 3  . docusate sodium (COLACE) 100 MG capsule Take 100 mg by mouth daily as needed. Constipation    . ezetimibe (ZETIA) 10 MG tablet Take 1 tablet (10 mg total) by mouth daily. 30 tablet 5  . furosemide (LASIX) 40 MG tablet TAKE ONE TABLET BY MOUTH TWICE DAILY 60 tablet 3  . HYDROcodone-acetaminophen (NORCO) 7.5-325 MG per tablet Take 1 tablet by mouth every 6 (six) hours as needed. Pain    . metolazone (ZAROXOLYN) 5 MG tablet TAKE ONE TABLET BY MOUTH ONCE DAILY 30 tablet 1  .  metoprolol (LOPRESSOR) 100 MG tablet TAKE ONE TABLET BY MOUTH TWICE DAILY 60 tablet 1  . midodrine (PROAMATINE) 5 MG tablet Take 5 mg by mouth daily.    . potassium chloride (K-DUR) 10 MEQ tablet TAKE THREE TABLETS BY MOUTH THREE TIMES DAILY 270 tablet 0  . potassium chloride (KLOR-CON M10) 10 MEQ tablet Take 3 tablets (30 mEq total) by mouth 3 (three) times daily. 270 tablet 4  . pravastatin (PRAVACHOL) 20 MG tablet TAKE ONE TABLET BY MOUTH ONCE DAILY WITH BREAKFAST 30 tablet 1  . spironolactone (ALDACTONE) 25 MG tablet Take 1 tablet (25 mg total) by mouth daily. 30 tablet 4  . VOLTAREN 1 % GEL Apply 4 g topically 3 (three) times daily.     No current facility-administered medications on file prior to visit.    Allergies  Allergen Reactions  . Benazepril Swelling  . Fish Allergy   . Metronidazole Hives  . Mobic [Meloxicam] Hives  .  Penicillins Swelling  . Sulfonamide Derivatives Hives    Family History  Problem Relation Age of Onset  . Gout Sister   . Hypertension Sister   . Stroke Sister   . Gout Brother   . Gout Brother   . Gout Brother   . Hypertension Brother   . Hypertension Brother   . Diabetes Neg Hx     BP 151/71 mmHg  Pulse 71  Temp(Src) 98.2 F (36.8 C) (Oral)  Wt 221 lb 9.6 oz (100.517 kg)  Review of Systems She denies hypoglycemia and weight change.      Objective:   Physical Exam VITAL SIGNS:  See vs page GENERAL: no distress Pulses: dorsalis pedis intact bilat.   MSK: no deformity of the feet CV: 1+ bilat leg edema Skin:  no ulcer on the feet.  normal color and temp on the feet. Neuro: sensation is intact to touch on the feet, but decreased from normal   Lab Results  Component Value Date   HGBA1C 8.8* 04/06/2014      Assessment & Plan:  DM: moderate exacerbation.  Based on the pattern of her cbg's, she needs a faster-acting insulin.   Patient is advised the following: Patient Instructions  check your blood sugar twice a day.  vary the time of day when you check, between before the 3 meals, and at bedtime.  also check if you have symptoms of your blood sugar being too high or too low.  please keep a record of the readings and bring it to your next appointment here.  You can write it on any piece of paper.  please call us sooner if your blood sugar goes below 70, or if you have a lot of readings over 200. blood tests are being requested for you today.  We'll let you know about the results. If it is high, we can change to "70/30" insulin.  Please come back for a follow-up appointment in 3 months    addendum: i have sent a prescription to your pharmacy, to change NPH to "70/30."

## 2014-04-06 NOTE — Patient Instructions (Addendum)
check your blood sugar twice a day.  vary the time of day when you check, between before the 3 meals, and at bedtime.  also check if you have symptoms of your blood sugar being too high or too low.  please keep a record of the readings and bring it to your next appointment here.  You can write it on any piece of paper.  please call us sooner if your blood sugar goes below 70, or if you have a lot of readings over 200. blood tests are being requested for you today.  We'll let you know about the results. If it is high, we can change to "70/30" insulin.  Please come back for a follow-up appointment in 3 months

## 2014-04-08 ENCOUNTER — Encounter: Payer: Self-pay | Admitting: Family Medicine

## 2014-04-08 ENCOUNTER — Ambulatory Visit (INDEPENDENT_AMBULATORY_CARE_PROVIDER_SITE_OTHER): Payer: PPO | Admitting: Family Medicine

## 2014-04-08 VITALS — BP 128/84 | HR 73 | Resp 16 | Ht 63.0 in | Wt 219.0 lb

## 2014-04-08 DIAGNOSIS — E790 Hyperuricemia without signs of inflammatory arthritis and tophaceous disease: Secondary | ICD-10-CM

## 2014-04-08 DIAGNOSIS — I10 Essential (primary) hypertension: Secondary | ICD-10-CM

## 2014-04-08 DIAGNOSIS — Z1159 Encounter for screening for other viral diseases: Secondary | ICD-10-CM

## 2014-04-08 DIAGNOSIS — E785 Hyperlipidemia, unspecified: Secondary | ICD-10-CM

## 2014-04-08 DIAGNOSIS — E781 Pure hyperglyceridemia: Secondary | ICD-10-CM

## 2014-04-08 DIAGNOSIS — Z0000 Encounter for general adult medical examination without abnormal findings: Secondary | ICD-10-CM

## 2014-04-08 LAB — COMPLETE METABOLIC PANEL WITH GFR
ALK PHOS: 82 U/L (ref 39–117)
ALT: 48 U/L — AB (ref 0–35)
AST: 31 U/L (ref 0–37)
Albumin: 3.9 g/dL (ref 3.5–5.2)
BILIRUBIN TOTAL: 0.4 mg/dL (ref 0.2–1.2)
BUN: 51 mg/dL — ABNORMAL HIGH (ref 6–23)
CHLORIDE: 103 meq/L (ref 96–112)
CO2: 24 mEq/L (ref 19–32)
CREATININE: 1.52 mg/dL — AB (ref 0.50–1.10)
Calcium: 10.1 mg/dL (ref 8.4–10.5)
GFR, EST AFRICAN AMERICAN: 41 mL/min — AB
GFR, EST NON AFRICAN AMERICAN: 36 mL/min — AB
Glucose, Bld: 73 mg/dL (ref 70–99)
POTASSIUM: 3.9 meq/L (ref 3.5–5.3)
SODIUM: 143 meq/L (ref 135–145)
Total Protein: 6.8 g/dL (ref 6.0–8.3)

## 2014-04-08 LAB — LIPID PANEL
CHOLESTEROL: 141 mg/dL (ref 0–200)
HDL: 31 mg/dL — ABNORMAL LOW (ref >=46)
LDL Cholesterol: 73 mg/dL (ref 0–99)
TRIGLYCERIDES: 183 mg/dL — AB (ref <150)
Total CHOL/HDL Ratio: 4.5 Ratio
VLDL: 37 mg/dL (ref 0–40)

## 2014-04-08 LAB — URIC ACID: Uric Acid, Serum: 7.7 mg/dL — ABNORMAL HIGH (ref 2.4–7.0)

## 2014-04-08 NOTE — Progress Notes (Signed)
Subjective:    Patient ID: Krista Strickland, female    DOB: April 06, 1950, 64 y.o.   MRN: [id number]  HPI  Preventive Screening-Counseling & Management   Patient present here today for a Medicare annual wellness visit.   Current Problems (verified)   Medications Prior to Visit Allergies (verified)   PAST HISTORY  Family History (verified)   Social History Married for 33 years, 2 children between them. Retired    Risk Factors  Current exercise habits:  Walks a few days a week for 20-30 mins   Dietary issues discussed: Heart healthy diet discussed, eats more fruits and vegetables and limit fried fatty foods    Cardiac risk factors: dm   Depression Screen  (Note: if answer to either of the following is "Yes", a more complete depression screening is indicated)   Over the past two weeks, have you felt down, depressed or hopeless? No  Over the past two weeks, have you felt little interest or pleasure in doing things? No  Have you lost interest or pleasure in daily life? No  Do you often feel hopeless? No  Do you cry easily over simple problems? No   Activities of Daily Living  In your present state of health, do you have any difficulty performing the following activities?  Driving?: No Managing money?: No Feeding yourself?:No Getting from bed to chair?:No Climbing a flight of stairs?:yes due to right hip pain Preparing food and eating?:No Bathing or showering?:No Getting dressed?:No Getting to the toilet?:No Using the toilet?:No Moving around from place to place?: No  Fall Risk Assessment In the past year have you fallen or had a near fall?:No Are you currently taking any medications that make you dizzy?:No   Hearing Difficulties: No Do you often ask people to speak up or repeat themselves?:No Do you experience ringing or noises in your ears?:No Do you have difficulty understanding soft or whispered voices?:No  Cognitive Testing  Alert? Yes Normal  Appearance?Yes  Oriented to person? Yes Place? Yes  Time? Yes  Displays appropriate judgment?Yes  Can read the correct time from a watch face? yes Are you having problems remembering things?No  Advanced Directives have been discussed with the patient?Yes, brochure given    List the Names of Other Physician/Practitioners you currently use:  Dr Loanne Drilling (END[facility]) , Ilda Basset any recent Medical Services you may have received from other than [facility] providers in the past year (date may be approximate).   Assessment:    Annual Wellness Exam   Plan:    Medicare Attestation  I have personally reviewed:  The patient's medical and social history  Their use of alcohol, tobacco or illicit drugs  Their current medications and supplements  The patient's functional ability including ADLs,fall risks, home safety risks, cognitive, and hearing and visual impairment  Diet and physical activities  Evidence for depression or mood disorders  The patient's weight, height, BMI, and visual acuity have been recorded in the chart. I have made referrals, counseling, and provided education to the patient based on review of the above and I have provided the patient with a written personalized care plan for preventive services.     Review of Systems     Objective:   Physical Exam BP 128/84 mmHg  Pulse 73  Resp 16  Ht 5\' 3"  (1.6 m)  Wt 219 lb (99.338 kg)  BMI 38.80 kg/m2  SpO2 98%        Assessment & Plan:  Medicare annual wellness  visit, subsequent Annual exam as documented. Counseling done  re healthy lifestyle involving commitment to 150 minutes exercise per week, heart healthy diet, and attaining healthy weight.The importance of adequate sleep also discussed. Regular seat belt use and home safety, is also discussed. Changes in health habits are decided on by the patient with goals and time frames  set for achieving them. Immunization and cancer screening needs are specifically  addressed at this visit.

## 2014-04-08 NOTE — Patient Instructions (Addendum)
F/u in 4 months, call if you need me before  Please work on living will  You need to have a small snack at bedtime IF your bedtime sugar is unde 130, (like crackers with peanut butter) Bedtime sugar should be between 130 to 170  Please reduce fatty  Foods, eggs, butter, cheese and red meat, triglycerides are high  Fasting lipid, cmp and EGFR, lipids, uric acid and TSH , hIV in 4 month

## 2014-04-08 NOTE — Assessment & Plan Note (Signed)

## 2014-04-09 MED ORDER — EZETIMIBE 10 MG PO TABS: 10.0000 mg | ORAL_TABLET | Freq: Every day | ORAL | Status: DC

## 2014-04-09 MED ORDER — POTASSIUM CHLORIDE CRYS ER 10 MEQ PO TBCR: 30.0000 meq | EXTENDED_RELEASE_TABLET | Freq: Three times a day (TID) | ORAL | Status: DC

## 2014-04-09 MED ORDER — SPIRONOLACTONE 25 MG PO TABS
25.0000 mg | ORAL_TABLET | Freq: Every day | ORAL | Status: DC
Start: 2014-04-09 — End: 2015-02-13

## 2014-04-09 MED ORDER — PRAVASTATIN SODIUM 20 MG PO TABS: ORAL_TABLET | ORAL | Status: DC

## 2014-05-27 ENCOUNTER — Other Ambulatory Visit: Payer: Self-pay | Admitting: Family Medicine

## 2014-07-07 ENCOUNTER — Ambulatory Visit (INDEPENDENT_AMBULATORY_CARE_PROVIDER_SITE_OTHER): Payer: PPO | Admitting: Endocrinology

## 2014-07-07 ENCOUNTER — Encounter: Payer: Self-pay | Admitting: Endocrinology

## 2014-07-07 VITALS — BP 132/80 | HR 72 | Ht 63.0 in | Wt 223.0 lb

## 2014-07-07 DIAGNOSIS — E1121 Type 2 diabetes mellitus with diabetic nephropathy: Secondary | ICD-10-CM | POA: Diagnosis not present

## 2014-07-07 LAB — HEMOGLOBIN A1C: Hgb A1c MFr Bld: 8.6 % — ABNORMAL HIGH (ref 4.6–6.5)

## 2014-07-07 NOTE — Patient Instructions (Addendum)
check your blood sugar twice a day.  vary the time of day when you check, between before the 3 meals, and at bedtime.  also check if you have symptoms of your blood sugar being too high or too low.  please keep a record of the readings and bring it to your next appointment here.  You can write it on any piece of paper.  please call us sooner if your blood sugar goes below 70, or if you have a lot of readings over 200. A diabetes blood test is requested for you today.  We'll let you know about the results.  Please come back for a follow-up appointment in 3 months.

## 2014-07-07 NOTE — Progress Notes (Signed)
Subjective:    Patient ID: Krista Strickland, female    DOB: 1950/06/14, 64 y.o.   MRN: [id number]  HPI DM type: insulin-requiring type 2 Dx'ed: [id number] Complications: polyneuropathy, renal insufficiency and CVA. Therapy: insulin since 2010 GDM: never DKA: never Severe hypoglycemia: never.   Pancreatitis: never.   Other: she declines weight loss surgery; she takes levemir, after cutaneous reactions to NPH and lantus; she has requested qd insulin, but she cannot afford analogs, so we had to re-try NPH (no reaction this time).   Interval history: pt states she feels well in general.   no cbg record, but states cbg's vary from 64-300, but most are in the 100's.  It is in general highest in am.  Pt says she never misses the insulin.   Past Medical History  Diagnosis Date  . Hyperlipidemia   . Obesity   . Diabetes mellitus, type 2   . Hypertension   . History of MRSA infection 03/2009    on ant abdomen   . Chronic kidney disease   . Gout   . Psoriasis   . Stroke   . Coronary artery disease   . GERD (gastroesophageal reflux disease)     Past Surgical History  Procedure Laterality Date  . Partial hysterectomy    . Rt,. neck biopsy    . Excison of rt breast cyst    . Appendectomy  2012  . Laparoscopic salpingo oopherectomy Right 04/22/2012    Procedure: LAPAROSCOPIC SALPINGO OOPHORECTOMY;  Surgeon: Jonnie Kind, MD;  Location: [facility];  Service: Gynecology;  Laterality: Right;  end 11:17  . Mass excision N/A 04/22/2012    Procedure: EXCISION SKIN TAGS NECK AND HEAD;  Surgeon: Jonnie Kind, MD;  Location: [facility];  Service: Gynecology;  Laterality: N/A;  start 11:19    History   Social History  . Marital Status: Married    Spouse Name: N/A  . Number of Children: 1  . Years of Education: N/A   Occupational History  . disabled     Social History Main Topics  . Smoking status: Former Research scientist (life sciences)  . Smokeless tobacco: Not on file  . Alcohol Use: No  . Drug Use: No  . Sexual  Activity: Yes   Other Topics Concern  . Not on file   Social History Narrative    Current Outpatient Prescriptions on File Prior to Visit  Medication Sig Dispense Refill  . allopurinol (ZYLOPRIM) 300 MG tablet Take 300 mg by mouth daily.      Marland Kitchen aspirin 325 MG tablet Take 325 mg by mouth daily.    . calcitRIOL (ROCALTROL) 0.25 MCG capsule Take 0.25 mcg by mouth every other day.     . cyclobenzaprine (FLEXERIL) 10 MG tablet Take 10 mg by mouth 3 (three) times daily as needed. Muscle Spasms    . diltiazem (DILACOR XR) 180 MG 24 hr capsule TAKE ONE CAPSULE BY MOUTH ONCE DAILY 30 capsule 1  . docusate sodium (COLACE) 100 MG capsule Take 100 mg by mouth daily as needed. Constipation    . ezetimibe (ZETIA) 10 MG tablet Take 1 tablet (10 mg total) by mouth daily. 30 tablet 5  . furosemide (LASIX) 40 MG tablet TAKE ONE TABLET BY MOUTH TWICE DAILY 60 tablet 1  . HYDROcodone-acetaminophen (NORCO) 7.5-325 MG per tablet Take 1 tablet by mouth every 4 (four) hours as needed. Pain    . insulin NPH-regular Human (NOVOLIN 70/30) (70-30) 100 UNIT/ML injection Inject 250 Units into  the skin daily with breakfast. (Patient taking differently: 225 units with breakfast, and 25 units with supper) 90 mL 11  . metolazone (ZAROXOLYN) 5 MG tablet TAKE ONE TABLET BY MOUTH ONCE DAILY 30 tablet 1  . metoprolol (LOPRESSOR) 100 MG tablet TAKE ONE TABLET BY MOUTH TWICE DAILY 60 tablet 1  . midodrine (PROAMATINE) 5 MG tablet Take 5 mg by mouth daily.    Glory Rosebush DELICA LANCETS [id number] MISC 1 Device by Does not apply route 2 (two) times daily. 60 each 11  . potassium chloride (K-DUR) 10 MEQ tablet TAKE THREE TABLETS BY MOUTH THREE TIMES DAILY 270 tablet 1  . potassium chloride (KLOR-CON M10) 10 MEQ tablet Take 3 tablets (30 mEq total) by mouth 3 (three) times daily. 270 tablet 5  . pravastatin (PRAVACHOL) 20 MG tablet TAKE ONE TABLET BY MOUTH ONCE DAILY WITH BREAKFAST 30 tablet 5  . spironolactone (ALDACTONE) 25 MG tablet Take  1 tablet (25 mg total) by mouth daily. 30 tablet 5  . VOLTAREN 1 % GEL Apply 4 g topically 3 (three) times daily.     No current facility-administered medications on file prior to visit.    Allergies  Allergen Reactions  . Benazepril Swelling  . Fish Allergy   . Metronidazole Hives  . Mobic [Meloxicam] Hives  . Penicillins Swelling  . Sulfonamide Derivatives Hives    Family History  Problem Relation Age of Onset  . Gout Sister   . Hypertension Brother   . Gout Brother   . Hypertension Brother   . Diabetes Neg Hx   . Hypertension Sister   . Gout Sister     BP 132/80 mmHg  Pulse 72  Ht 5\' 3"  (1.6 m)  Wt 223 lb (101.152 kg)  BMI 39.51 kg/m2  SpO2 95%  Review of Systems Denies LOC    Objective:   Physical Exam VITAL SIGNS:  See vs page GENERAL: no distress Pulses: dorsalis pedis intact bilat.   MSK: no deformity of the feet CV: no leg edema Skin:  no ulcer on the feet.  normal color and temp on the feet. Neuro: sensation is intact to touch on the feet.     Lab Results  Component Value Date   HGBA1C 8.6* 07/07/2014      Assessment & Plan:  DM: Based on the pattern of her cbg's, she needs some adjustment in her therapy.   Patient is advised the following: Patient Instructions  check your blood sugar twice a day.  vary the time of day when you check, between before the 3 meals, and at bedtime.  also check if you have symptoms of your blood sugar being too high or too low.  please keep a record of the readings and bring it to your next appointment here.  You can write it on any piece of paper.  please call us sooner if your blood sugar goes below 70, or if you have a lot of readings over 200. A diabetes blood test is requested for you today.  We'll let you know about the results.  Please come back for a follow-up appointment in 3 months.    addendum: change insulin to 225 units with breakfast, and 25 units with supper.

## 2014-07-29 ENCOUNTER — Other Ambulatory Visit: Payer: Self-pay | Admitting: Family Medicine

## 2014-08-07 LAB — COMPLETE METABOLIC PANEL WITH GFR
ALK PHOS: 81 U/L (ref 39–117)
ALT: 33 U/L (ref 0–35)
AST: 29 U/L (ref 0–37)
Albumin: 3.9 g/dL (ref 3.5–5.2)
BUN: 34 mg/dL — ABNORMAL HIGH (ref 6–23)
CHLORIDE: 103 meq/L (ref 96–112)
CO2: 26 mEq/L (ref 19–32)
Calcium: 9.6 mg/dL (ref 8.4–10.5)
Creat: 1.57 mg/dL — ABNORMAL HIGH (ref 0.50–1.10)
GFR, Est African American: 40 mL/min — ABNORMAL LOW
GFR, Est Non African American: 35 mL/min — ABNORMAL LOW
Glucose, Bld: 139 mg/dL — ABNORMAL HIGH (ref 70–99)
Potassium: 4.3 mEq/L (ref 3.5–5.3)
Sodium: 141 mEq/L (ref 135–145)
TOTAL PROTEIN: 6.5 g/dL (ref 6.0–8.3)
Total Bilirubin: 0.4 mg/dL (ref 0.2–1.2)

## 2014-08-07 LAB — TSH: TSH: 3.059 u[IU]/mL (ref 0.350–4.500)

## 2014-08-07 LAB — HIV ANTIBODY (ROUTINE TESTING W REFLEX): HIV: NONREACTIVE

## 2014-08-07 LAB — URIC ACID: Uric Acid, Serum: 6 mg/dL (ref 2.4–7.0)

## 2014-08-10 ENCOUNTER — Encounter: Payer: Self-pay | Admitting: Family Medicine

## 2014-08-10 ENCOUNTER — Ambulatory Visit (INDEPENDENT_AMBULATORY_CARE_PROVIDER_SITE_OTHER): Payer: PPO | Admitting: Family Medicine

## 2014-08-10 VITALS — BP 142/70 | HR 90 | Resp 18 | Ht 63.0 in | Wt 222.1 lb

## 2014-08-10 DIAGNOSIS — M5432 Sciatica, left side: Secondary | ICD-10-CM

## 2014-08-10 DIAGNOSIS — E785 Hyperlipidemia, unspecified: Secondary | ICD-10-CM

## 2014-08-10 DIAGNOSIS — N183 Chronic kidney disease, stage 3 unspecified: Secondary | ICD-10-CM

## 2014-08-10 DIAGNOSIS — M1000 Idiopathic gout, unspecified site: Secondary | ICD-10-CM

## 2014-08-10 DIAGNOSIS — E559 Vitamin D deficiency, unspecified: Secondary | ICD-10-CM

## 2014-08-10 DIAGNOSIS — M5431 Sciatica, right side: Secondary | ICD-10-CM

## 2014-08-10 DIAGNOSIS — I10 Essential (primary) hypertension: Secondary | ICD-10-CM | POA: Diagnosis not present

## 2014-08-10 DIAGNOSIS — E1121 Type 2 diabetes mellitus with diabetic nephropathy: Secondary | ICD-10-CM

## 2014-08-10 MED ORDER — METHYLPREDNISOLONE ACETATE 80 MG/ML IJ SUSP
80.0000 mg | Freq: Once | INTRAMUSCULAR | Status: AC
  Administered 2014-08-10: 80 mg via INTRAMUSCULAR

## 2014-08-10 MED ORDER — PREDNISONE 5 MG (21) PO TBPK: 5.0000 mg | ORAL_TABLET | ORAL | Status: DC

## 2014-08-10 NOTE — Assessment & Plan Note (Signed)
Uncontrolled, no med change ,nurse BP re check in 6 weeks DASH diet and commitment to daily physical activity for a minimum of 30 minutes discussed and encouraged, as a part of hypertension management. The importance of attaining a healthy weight is also discussed.  BP/Weight 08/10/2014 07/07/2014 04/08/2014 04/06/2014 03/08/2014 01/04/2014 [id number]  Systolic BP [id number] [id number]  Diastolic BP 70 80 84 71 84 84 78  Wt. (Lbs) 222.12 223 219 221.6 220 208 207  BMI 39.36 39.51 38.8 39.26 38.98 36.85 35.51

## 2014-08-10 NOTE — Patient Instructions (Addendum)
Annual physical exam 11/20 or after, call if you need me before  Nurse BP eval in 6 weeks  Your labs show good liver function, good uric acid level and kidney function about the same, we will fax to you kidney Doc  You will get injection in office for sciatic nerve pain and 6 days of prednisone prescribed , call back if persists  It is important that you exercise regularly at least 30 minutes 5 times a week. If you develop chest pain, have severe difficulty breathing, or feel very tired, stop exercising immediately and seek medical attention   Please work on weight loss, increase vegetable , reduce carbs , sweets and protein portion size  Thanks for choosing [facility], we consider it a privelige to serve you.   Fasting lipid, cmp and EGFr and CBc and vit D and microalb in [employer]

## 2014-08-10 NOTE — Assessment & Plan Note (Signed)
1 week huistory, depo medrol 80 mg in office , then pred dose pack

## 2014-08-27 ENCOUNTER — Other Ambulatory Visit: Payer: Self-pay | Admitting: Family Medicine

## 2014-09-01 LAB — HM DIABETES EYE EXAM

## 2014-09-21 ENCOUNTER — Ambulatory Visit: Payer: PPO

## 2014-09-21 VITALS — BP 130/80 | Wt 223.0 lb

## 2014-09-21 DIAGNOSIS — I10 Essential (primary) hypertension: Secondary | ICD-10-CM

## 2014-09-21 NOTE — Progress Notes (Signed)
BP was 130/80. Advised to continue same meds and return for follow up visit

## 2014-10-11 ENCOUNTER — Ambulatory Visit (INDEPENDENT_AMBULATORY_CARE_PROVIDER_SITE_OTHER): Payer: PPO | Admitting: Endocrinology

## 2014-10-11 ENCOUNTER — Encounter: Payer: Self-pay | Admitting: Endocrinology

## 2014-10-11 VITALS — BP 137/86 | HR 78 | Temp 98.3°F | Ht 63.0 in | Wt 219.0 lb

## 2014-10-11 DIAGNOSIS — E1121 Type 2 diabetes mellitus with diabetic nephropathy: Secondary | ICD-10-CM

## 2014-10-11 LAB — POCT GLYCOSYLATED HEMOGLOBIN (HGB A1C): Hemoglobin A1C: 8.4

## 2014-10-11 MED ORDER — INSULIN REGULAR HUMAN 100 UNIT/ML IJ SOLN: 50.0000 [IU] | Freq: Every day | INTRAMUSCULAR | Status: DC

## 2014-10-11 MED ORDER — INSULIN NPH ISOPHANE & REGULAR (70-30) 100 UNIT/ML ~~LOC~~ SUSP: 200.0000 [IU] | Freq: Every day | SUBCUTANEOUS | Status: DC

## 2014-10-11 NOTE — Progress Notes (Signed)
Subjective:    Patient ID: Krista Strickland, female    DOB: Apr 19, 1950, 64 y.o.   MRN: [id number]  HPI DM type: insulin-requiring type 2 Dx'ed: [id number] Complications: polyneuropathy, renal insufficiency and CVA. Therapy: insulin since 2010 GDM: never DKA: never Severe hypoglycemia: never.   Pancreatitis: never.   Other: she declines weight loss surgery; she takes levemir, after cutaneous reactions to NPH and lantus; she has requested qd insulin, but she cannot afford analogs, so we had to re-try NPH (no reaction this time).   Interval history: Pt says she never misses the insulin.  She takes 200 units qam and 50 units qpm.  Meter is downloaded today, and the printout is scanned into the record.  It varies from 70-400.  It is highest at hs, and lower at other times of day.   Past Medical History  Diagnosis Date  . Hyperlipidemia   . Obesity   . Diabetes mellitus, type 2   . Hypertension   . History of MRSA infection 03/2009    on ant abdomen   . Chronic kidney disease   . Gout   . Psoriasis   . Stroke   . Coronary artery disease   . GERD (gastroesophageal reflux disease)     Past Surgical History  Procedure Laterality Date  . Partial hysterectomy    . Rt,. neck biopsy    . Excison of rt breast cyst    . Appendectomy  2012  . Laparoscopic salpingo oopherectomy Right 04/22/2012    Procedure: LAPAROSCOPIC SALPINGO OOPHORECTOMY;  Surgeon: Jonnie Kind, MD;  Location: [facility];  Service: Gynecology;  Laterality: Right;  end 11:17  . Mass excision N/A 04/22/2012    Procedure: EXCISION SKIN TAGS NECK AND HEAD;  Surgeon: Jonnie Kind, MD;  Location: [facility];  Service: Gynecology;  Laterality: N/A;  start 11:19    Social History   Social History  . Marital Status: Married    Spouse Name: N/A  . Number of Children: 1  . Years of Education: N/A   Occupational History  . disabled     Social History Main Topics  . Smoking status: Former Research scientist (life sciences)  . Smokeless tobacco: Not on  file  . Alcohol Use: No  . Drug Use: No  . Sexual Activity: Yes   Other Topics Concern  . Not on file   Social History Narrative    Current Outpatient Prescriptions on File Prior to Visit  Medication Sig Dispense Refill  . allopurinol (ZYLOPRIM) 300 MG tablet Take 300 mg by mouth daily.      Marland Kitchen aspirin 325 MG tablet Take 325 mg by mouth daily.    . calcitRIOL (ROCALTROL) 0.25 MCG capsule Take 0.25 mcg by mouth every other day.     . cyclobenzaprine (FLEXERIL) 10 MG tablet Take 10 mg by mouth 3 (three) times daily as needed. Muscle Spasms    . diltiazem (DILACOR XR) 180 MG 24 hr capsule TAKE ONE CAPSULE BY MOUTH ONCE DAILY 30 capsule 2  . docusate sodium (COLACE) 100 MG capsule Take 100 mg by mouth daily as needed. Constipation    . ezetimibe (ZETIA) 10 MG tablet Take 1 tablet (10 mg total) by mouth daily. 30 tablet 5  . furosemide (LASIX) 40 MG tablet TAKE ONE TABLET BY MOUTH TWICE DAILY 60 tablet 2  . HYDROcodone-acetaminophen (NORCO) 7.5-325 MG per tablet Take 1 tablet by mouth every 4 (four) hours as needed. Pain    . metolazone (ZAROXOLYN) 5 MG  tablet TAKE ONE TABLET BY MOUTH ONCE DAILY 30 tablet 2  . metoprolol (LOPRESSOR) 100 MG tablet TAKE ONE TABLET BY MOUTH TWICE DAILY 60 tablet 2  . midodrine (PROAMATINE) 5 MG tablet Take 5 mg by mouth daily.    Glory Rosebush DELICA LANCETS [id number] MISC 1 Device by Does not apply route 2 (two) times daily. 60 each 11  . potassium chloride (K-DUR) 10 MEQ tablet TAKE THREE TABLETS BY MOUTH THREE TIMES DAILY 270 tablet 2  . predniSONE (STERAPRED UNI-PAK 21 TAB) 5 MG (21) TBPK tablet Take 1 tablet (5 mg total) by mouth as directed. Use as directed 21 tablet 0  . spironolactone (ALDACTONE) 25 MG tablet Take 1 tablet (25 mg total) by mouth daily. 30 tablet 5  . VOLTAREN 1 % GEL Apply 4 g topically 3 (three) times daily.     No current facility-administered medications on file prior to visit.    Allergies  Allergen Reactions  . Benazepril Swelling  .  Fish Allergy   . Metronidazole Hives  . Mobic [Meloxicam] Hives  . Penicillins Swelling  . Sulfonamide Derivatives Hives    Family History  Problem Relation Age of Onset  . Gout Sister   . Hypertension Brother   . Gout Brother   . Hypertension Brother   . Diabetes Neg Hx   . Hypertension Sister   . Gout Sister     BP 137/86 mmHg  Pulse 78  Temp(Src) 98.3 F (36.8 C) (Oral)  Ht 5\' 3"  (1.6 m)  Wt 219 lb (99.338 kg)  BMI 38.80 kg/m2  SpO2 94%  Review of Systems She denies hypoglycemia.     Objective:   Physical Exam VITAL SIGNS:  See vs page GENERAL: no distress Pulses: dorsalis pedis intact bilat.   MSK: no deformity of the feet CV: trace bilat leg edema Skin:  no ulcer on the feet.  normal color and temp on the feet. Neuro: sensation is intact to touch on the feet.   Ext: There is bilateral onychomycosis of the toenails.    A1c=8.4%    Assessment & Plan:  DM: Based on the pattern of her cbg's, she needs some adjustment in her therapy.  Patient is advised the following: Patient Instructions  check your blood sugar twice a day.  vary the time of day when you check, between before the 3 meals, and at bedtime.  also check if you have symptoms of your blood sugar being too high or too low.  please keep a record of the readings and bring it to your next appointment here.  You can write it on any piece of paper.  please call us sooner if your blood sugar goes below 70, or if you have a lot of readings over 200.   Please continue the same 70/30 insulin with breakfast.  However, change the evening insulin to regular (same 50 units).   Please come back for a follow-up appointment in 2-3 months.

## 2014-10-11 NOTE — Patient Instructions (Addendum)
check your blood sugar twice a day.  vary the time of day when you check, between before the 3 meals, and at bedtime.  also check if you have symptoms of your blood sugar being too high or too low.  please keep a record of the readings and bring it to your next appointment here.  You can write it on any piece of paper.  please call us sooner if your blood sugar goes below 70, or if you have a lot of readings over 200.   Please continue the same 70/30 insulin with breakfast.  However, change the evening insulin to regular (same 50 units).   Please come back for a follow-up appointment in 2-3 months.

## 2014-10-18 NOTE — Assessment & Plan Note (Signed)
Unchanged, importance of good blood sugar and blood pressure  control is stressed. Recent lab sent to pt's nephrologist

## 2014-10-18 NOTE — Assessment & Plan Note (Signed)
Uncontrolled.Toradol and depo medrol administered IM in the office , to be followed by a short course of oral prednisone and NSAIDS.  

## 2014-10-18 NOTE — Progress Notes (Signed)
Subjective:    Patient ID: Krista Strickland, female    DOB: Oct 16, 1950, 64 y.o.   MRN: [id number]  HPI The PT is here for follow up and re-evaluation of chronic medical conditions, medication management and review of any available recent lab and radiology data.  Preventive health is updated, specifically  Cancer screening and Immunization.   Questions or concerns regarding consultations or procedures which the PT has had in the interim are  addressed. The PT denies any adverse reactions to current medications since the last visit.  2 week h/o back pain radiating to both lower extremities, has established disc disease, no inciting agent. Denies weakness, numbness or incontinence Denies polyuria, polydipsia, blurred vision , or hypoglycemic episodes.  followed by endo      Review of Systems See HPI Denies recent fever or chills. Denies sinus pressure, nasal congestion, ear pain or sore throat. Denies chest congestion, productive cough or wheezing. Denies chest pains, palpitations and leg swelling Denies abdominal pain, nausea, vomiting,diarrhea or constipation.   Denies dysuria, frequency, hesitancy or incontinence. Denies headaches, seizures, numbness, or tingling. Denies depression, anxiety or insomnia. Denies skin break down or rash.        Objective:   Physical Exam BP 142/70 mmHg  Pulse 90  Resp 18  Ht 5\' 3"  (1.6 m)  Wt 222 lb 1.9 oz (100.753 kg)  BMI 39.36 kg/m2  SpO2 97% Pt in pain  Patient alert and oriented and in no cardiopulmonary distress.  HEENT: No facial asymmetry, EOMI,   oropharynx pink and moist.  Neck supple no JVD, no mass.  Chest: Clear to auscultation bilaterally.  CVS: S1, S2 no murmurs, no S3.Regular rate.  ABD: Soft non tender.   Ext: No edema  MS: Decreased  ROM spine, adequate in shoulders, hips and knees.  Skin: Intact, no ulcerations or rash noted.  Psych: Good eye contact, normal affect. Memory intact not anxious or depressed  appearing.  CNS: CN 2-12 intact, power,  normal throughout.no focal deficits noted.        Assessment & Plan:  Bilateral sciatica 1 week huistory, depo medrol 80 mg in office , then pred dose pack  Essential hypertension Uncontrolled, no med change ,nurse BP re check in 6 weeks DASH diet and commitment to daily physical activity for a minimum of 30 minutes discussed and encouraged, as a part of hypertension management. The importance of attaining a healthy weight is also discussed.  BP/Weight 08/10/2014 07/07/2014 04/08/2014 04/06/2014 03/08/2014 01/04/2014 Q000111Q  Systolic BP A999333 Q000111Q 0000000 [id number] Q000111Q [id number]  Diastolic BP 70 80 84 71 84 84 78  Wt. (Lbs) 222.12 223 219 221.6 220 208 207  BMI 39.36 39.51 38.8 39.26 38.98 36.85 35.51        Back pain with left-sided sciatica Uncontrolled.Toradol and depo medrol administered IM in the office , to be followed by a short course of oral prednisone and NSAIDS.   Hyperlipidemia LDL goal <100 Hyperlipidemia:Low fat diet discussed and encouraged.   Lipid Panel  Lab Results  Component Value Date   CHOL 141 04/07/2014   HDL 31* 04/07/2014   LDLCALC 73 04/07/2014   TRIG 183* 04/07/2014   CHOLHDL 4.5 04/07/2014   Needs to reduce fried and fatty foods Updated lab needed at/ before next visit.      Morbid obesity Deteriorated. Patient re-educated about  the importance of commitment to a  minimum of 150 minutes of exercise per week.  The importance of healthy food choices  with portion control discussed. Encouraged to start a food diary, count calories and to consider  joining a support group. Sample diet sheets offered. Goals set by the patient for the next several months.   Weight /BMI 10/11/2014 09/21/2014 08/10/2014  WEIGHT 219 lb 223 lb 222 lb 1.9 oz  HEIGHT 5\' 3"  - 5\' 3"   BMI 38.8 kg/m2 39.51 kg/m2 39.36 kg/m2    Current exercise per week 90 minutes.   Gout No recent flare and uric acid level well controlled  CHRONIC  KIDNEY DISEASE STAGE III (MODERATE) Unchanged, importance of good blood sugar and blood pressure  control is stressed. Recent lab sent to pt's nephrologist

## 2014-10-18 NOTE — Assessment & Plan Note (Signed)
Hyperlipidemia:Low fat diet discussed and encouraged.   Lipid Panel  Lab Results  Component Value Date   CHOL 141 04/07/2014   HDL 31* 04/07/2014   LDLCALC 73 04/07/2014   TRIG 183* 04/07/2014   CHOLHDL 4.5 04/07/2014   Needs to reduce fried and fatty foods Updated lab needed at/ before next visit.

## 2014-10-18 NOTE — Assessment & Plan Note (Signed)
No recent flare and uric acid level well controlled

## 2014-10-18 NOTE — Assessment & Plan Note (Signed)
Deteriorated. Patient re-educated about  the importance of commitment to a  minimum of 150 minutes of exercise per week.  The importance of healthy food choices with portion control discussed. Encouraged to start a food diary, count calories and to consider  joining a support group. Sample diet sheets offered. Goals set by the patient for the next several months.   Weight /BMI 10/11/2014 09/21/2014 08/10/2014  WEIGHT 219 lb 223 lb 222 lb 1.9 oz  HEIGHT 5\' 3"  - 5\' 3"   BMI 38.8 kg/m2 39.51 kg/m2 39.36 kg/m2    Current exercise per week 90 minutes.

## 2014-11-11 DIAGNOSIS — M5416 Radiculopathy, lumbar region: Secondary | ICD-10-CM | POA: Insufficient documentation

## 2014-11-29 ENCOUNTER — Telehealth: Payer: Self-pay

## 2014-11-29 ENCOUNTER — Other Ambulatory Visit: Payer: Self-pay | Admitting: Family Medicine

## 2014-11-29 ENCOUNTER — Telehealth: Payer: Self-pay | Admitting: Endocrinology

## 2014-11-29 DIAGNOSIS — R601 Generalized edema: Secondary | ICD-10-CM

## 2014-11-29 DIAGNOSIS — E785 Hyperlipidemia, unspecified: Secondary | ICD-10-CM

## 2014-11-29 NOTE — Telephone Encounter (Signed)
Pt is not feeling well, is swelling, when she takes the insulin as directed it makes her levels drop too low

## 2014-11-29 NOTE — Addendum Note (Signed)
Addended by: Denman George B on: 11/29/2014 01:48 PM   Modules accepted: Orders

## 2014-11-29 NOTE — Telephone Encounter (Signed)
See note below and please advise, Thanks! 

## 2014-11-29 NOTE — Telephone Encounter (Signed)
Patient will go and have labs stat in the am at [facility] and then will come for appt on 11/1 @ 2

## 2014-11-29 NOTE — Telephone Encounter (Signed)
please call patient: What time of day is it going low?

## 2014-11-29 NOTE — Telephone Encounter (Signed)
pls advise fassting lipid, cmp and EGFR, but drink water , tho fasting , needs visit this week as work in for further eval and mabmanagement pls add on

## 2014-11-30 ENCOUNTER — Encounter: Payer: Self-pay | Admitting: Family Medicine

## 2014-11-30 ENCOUNTER — Ambulatory Visit (INDEPENDENT_AMBULATORY_CARE_PROVIDER_SITE_OTHER): Payer: PPO | Admitting: Family Medicine

## 2014-11-30 ENCOUNTER — Other Ambulatory Visit (HOSPITAL_COMMUNITY)
Admission: RE | Admit: 2014-11-30 | Discharge: 2014-11-30 | Disposition: A | Discharge status: Home / Self Care | Payer: PPO | Source: Ambulatory Visit | Attending: Family Medicine | Admitting: Family Medicine

## 2014-11-30 VITALS — BP 146/76 | HR 80 | Resp 18 | Ht 63.0 in | Wt 225.0 lb

## 2014-11-30 DIAGNOSIS — I10 Essential (primary) hypertension: Secondary | ICD-10-CM | POA: Diagnosis present

## 2014-11-30 DIAGNOSIS — R600 Localized edema: Secondary | ICD-10-CM | POA: Diagnosis not present

## 2014-11-30 DIAGNOSIS — E11628 Type 2 diabetes mellitus with other skin complications: Secondary | ICD-10-CM | POA: Diagnosis not present

## 2014-11-30 DIAGNOSIS — E785 Hyperlipidemia, unspecified: Secondary | ICD-10-CM | POA: Insufficient documentation

## 2014-11-30 DIAGNOSIS — M5432 Sciatica, left side: Secondary | ICD-10-CM | POA: Diagnosis not present

## 2014-11-30 DIAGNOSIS — L84 Corns and callosities: Secondary | ICD-10-CM

## 2014-11-30 DIAGNOSIS — Z23 Encounter for immunization: Secondary | ICD-10-CM

## 2014-11-30 LAB — COMPREHENSIVE METABOLIC PANEL
ALBUMIN: 3.6 g/dL (ref 3.5–5.0)
ALT: 52 U/L (ref 14–54)
ANION GAP: 8 (ref 5–15)
AST: 42 U/L — ABNORMAL HIGH (ref 15–41)
Alkaline Phosphatase: 54 U/L (ref 38–126)
BUN: 50 mg/dL — ABNORMAL HIGH (ref 6–20)
CHLORIDE: 102 mmol/L (ref 101–111)
CO2: 30 mmol/L (ref 22–32)
Calcium: 10.3 mg/dL (ref 8.9–10.3)
Creatinine, Ser: 1.47 mg/dL — ABNORMAL HIGH (ref 0.44–1.00)
GFR calc non Af Amer: 37 mL/min — ABNORMAL LOW (ref >60)
GFR, EST AFRICAN AMERICAN: 42 mL/min — AB (ref >60)
GLUCOSE: 80 mg/dL (ref 65–99)
Potassium: 3.9 mmol/L (ref 3.5–5.1)
SODIUM: 140 mmol/L (ref 135–145)
Total Bilirubin: 0.3 mg/dL (ref 0.3–1.2)
Total Protein: 7 g/dL (ref 6.5–8.1)

## 2014-11-30 LAB — LIPID PANEL
CHOL/HDL RATIO: 4.1 ratio
Cholesterol: 196 mg/dL (ref 0–200)
HDL: 48 mg/dL (ref >40)
LDL CALC: 129 mg/dL — AB (ref 0–99)
TRIGLYCERIDES: 95 mg/dL (ref <150)
VLDL: 19 mg/dL (ref 0–40)

## 2014-11-30 NOTE — Assessment & Plan Note (Addendum)
5 day history, increase lasix to two in am, and one in pm for 1 week, then re eval, likley due to nephropathy, no symptoms or signs of CHF

## 2014-11-30 NOTE — Patient Instructions (Signed)
F/u in 8 days, call if you need me before  Chem 7 and EGFR in 7 days  INCREASe furosemide  (lasix) for this week start today to THREE tabs EVERY DAY, reduce salt in diet   Blood pressure slight high, again reduce salt, eat fresh or frozen vegetable  Flu vaccine today  Reduce nighttime insulin to 25 units tonight if you  Do not get a call about it since blood sugar in the morning is still low at 79 REMEMBER you mUST EAT regularly on aschedule

## 2014-11-30 NOTE — Progress Notes (Signed)
   Subjective:    Patient ID: Krista Strickland, female    DOB: 23-May-1950, 64 y.o.   MRN: [id number]  HPI    Review of Systems     Objective:   Physical Exam        Assessment & Plan:

## 2014-12-07 ENCOUNTER — Telehealth: Payer: Self-pay | Admitting: Family Medicine

## 2014-12-07 ENCOUNTER — Other Ambulatory Visit: Payer: Self-pay | Admitting: Family Medicine

## 2014-12-07 ENCOUNTER — Encounter: Payer: Self-pay | Admitting: Family Medicine

## 2014-12-07 ENCOUNTER — Ambulatory Visit (INDEPENDENT_AMBULATORY_CARE_PROVIDER_SITE_OTHER): Payer: PPO | Admitting: Family Medicine

## 2014-12-07 VITALS — BP 132/68 | HR 76 | Resp 18 | Ht 63.0 in | Wt 227.0 lb

## 2014-12-07 DIAGNOSIS — N183 Chronic kidney disease, stage 3 unspecified: Secondary | ICD-10-CM

## 2014-12-07 DIAGNOSIS — E1121 Type 2 diabetes mellitus with diabetic nephropathy: Secondary | ICD-10-CM | POA: Diagnosis not present

## 2014-12-07 DIAGNOSIS — R600 Localized edema: Secondary | ICD-10-CM

## 2014-12-07 DIAGNOSIS — I10 Essential (primary) hypertension: Secondary | ICD-10-CM

## 2014-12-07 DIAGNOSIS — Z794 Long term (current) use of insulin: Secondary | ICD-10-CM

## 2014-12-07 DIAGNOSIS — Z1231 Encounter for screening mammogram for malignant neoplasm of breast: Secondary | ICD-10-CM

## 2014-12-07 LAB — COMPLETE METABOLIC PANEL WITH GFR
ALT: 60 U/L — ABNORMAL HIGH (ref 6–29)
AST: 40 U/L — ABNORMAL HIGH (ref 10–35)
Albumin: 3.8 g/dL (ref 3.6–5.1)
Alkaline Phosphatase: 65 U/L (ref 33–130)
BUN: 52 mg/dL — ABNORMAL HIGH (ref 7–25)
CHLORIDE: 99 mmol/L (ref 98–110)
CO2: 33 mmol/L — AB (ref 20–31)
Calcium: 9.5 mg/dL (ref 8.6–10.4)
Creat: 1.72 mg/dL — ABNORMAL HIGH (ref 0.50–0.99)
GFR, EST NON AFRICAN AMERICAN: 31 mL/min — AB (ref >=60)
GFR, Est African American: 36 mL/min — ABNORMAL LOW (ref >=60)
GLUCOSE: 60 mg/dL — AB (ref 65–99)
POTASSIUM: 4.3 mmol/L (ref 3.5–5.3)
SODIUM: 143 mmol/L (ref 135–146)
Total Bilirubin: 0.4 mg/dL (ref 0.2–1.2)
Total Protein: 6.7 g/dL (ref 6.1–8.1)

## 2014-12-07 NOTE — Patient Instructions (Addendum)
Annual wellness March 12 or after , call if you need me sooner  Return to prior dose of lasix one twice daily  You are referred to you kidney specialist for help with swelling  If you  Do not get a message from your diabetes Doc by end week I recommend that  you call for an appt, if you need [place] to help with that pls call and let us know  Please work on good  health habits so that your health will improve. 1. Commitment to daily physical activity for 30 to 60  minutes, if you are able to do this.  2. Commitment to wise food choices. Aim for half of your  food intake to be vegetable and fruit, one quarter starchy foods, and one quarter protein. Try to eat on a regular schedule  3 meals per day, snacking between meals should be limited to vegetables or fruits or small portions of nuts. 64 ounces of water per day is generally recommended, unless you have specific health conditions, like heart failure or kidney failure where you will need to limit fluid intake.  3. Commitment to sufficient and a  good quality of physical and mental rest daily, generally between 6 to 8 hours per day.  WITH PERSISTANCE AND PERSEVERANCE, THE IMPOSSIBLE , BECOMES THE NORM! Thanks for choosing [facility], we consider it a privelige to serve you.

## 2014-12-07 NOTE — Assessment & Plan Note (Signed)
After obtaining informed consent, the vaccine is  administered by LPN.  

## 2014-12-07 NOTE — Progress Notes (Signed)
Subjective:    Patient ID: Krista Strickland, female    DOB: [birth year], 65 y.o.   MRN: [id number]  HPI Pt i for re evaluation of bilateral leg edema following recent increase in diuretic. Unfortunately, she has had no response and renal function has deteriorated. She will return to prior dose of lasix and is to see nephrology as soon as is possible  She denies pND , orthopnea, palpitation or chest pain She continues tpo experience hypoglycemia and I have advised that apart from trying to self adjust med, if unable to et  Advice from her endo office , she needs to sched an appt   Review of Systems See HPI Denies recent fever or chills.c/o fatigue Denies sinus pressure, nasal congestion, ear pain or sore throat. Denies chest congestion, productive cough or wheezing.  Denies abdominal pain, nausea, vomiting,diarrhea or constipation.   Denies dysuria, frequency, hesitancy or incontinence. Chronic  joint pain, swelling and limitation in mobility. Denies headaches, seizures, numbness, or tingling. Denies depression, does have some  anxiety Denies skin break down or rash.         Objective:   Physical Exam BP 132/68 mmHg  Pulse 76  Resp 18  Ht 5\' 3"  (1.6 m)  Wt 227 lb (102.967 kg)  BMI 40.22 kg/m2  SpO2 95% Patient alert and oriented and in no cardiopulmonary distress.  HEENT: No facial asymmetry, EOMI,   oropharynx pink and moist.  Neck supple no JVD, no mass.  Chest: Clear to auscultation bilaterally.  CVS: S1, S2 no murmurs, no S3.Regular rate.  ABD: Soft non tender.   Ext: one plus pitting  Edema bilaterally  MS: decreased  ROM spine, shoulders, hips and knees.  Skin: Intact, no ulcerations or rash noted.  Psych: Good eye contact, normal affect. Memory intact not anxious or depressed appearing.  CNS: CN 2-12 intact, power,  normal throughout.no focal deficits noted.        Assessment & Plan:  CKD (chronic kidney disease) stage 3, GFR 30-59 ml/min No  significant response to increased dose of diuretic as far as fluid retention is concerned, weight is unchanged, and renal failure appears worsened  Bilateral leg edema unchanged , despite increased diuretic dose , no s/s of heart failure , needs nephrology input as soon as possible  Essential hypertension Controlled, no change in medication DASH diet and commitment to daily physical activity for a minimum of 30 minutes discussed and encouraged, as a part of hypertension management. The importance of attaining a healthy weight is also discussed.  BP/Weight 12/07/2014 11/30/2014 10/11/2014 09/21/2014 08/10/2014 07/07/2014 Q000111Q  Systolic BP Q000111Q 123456 0000000 [id number] Q000111Q 0000000  Diastolic BP 68 76 86 80 70 80 84  Wt. (Lbs) 227 225 219 223 222.12 223 219  BMI 40.22 39.87 38.8 39.51 39.36 39.51 38.8        Type 2 diabetes mellitus with diabetic nephropathy Followed by endo Reports multiple episodeds of hypoglycemia and has been adjusting her medication. Has attempted contact with endo office , i have advised her to sched appt for re eval if this continues to be a problem  Morbid obesity Deteriorated. Patient re-educated about  the importance of commitment to a  minimum of 150 minutes of exercise per week.  The importance of healthy food choices with portion control discussed. Encouraged to start a food diary, count calories and to consider  joining a support group. Sample diet sheets offered. Goals set by the patient for the next several months.  Weight /BMI 12/07/2014 11/30/2014 10/11/2014  WEIGHT 227 lb 225 lb 219 lb  HEIGHT 5\' 3"  5\' 3"  5\' 3"   BMI 40.22 kg/m2 39.87 kg/m2 38.8 kg/m2    Current exercise per week 48minutes.

## 2014-12-07 NOTE — Assessment & Plan Note (Signed)
Uncontrolled, hopefully additional lasix will be benficial DASH diet and commitment to daily physical activity for a minimum of 30 minutes discussed and encouraged, as a part of hypertension management. The importance of attaining a healthy weight is also discussed.  BP/Weight 11/30/2014 10/11/2014 09/21/2014 08/10/2014 07/07/2014 Q000111Q 0000000  Systolic BP 123456 0000000 [id number] Q000111Q 0000000 123XX123  Diastolic BP 76 86 80 70 80 84 71  Wt. (Lbs) 225 219 223 222.12 223 219 221.6  BMI 39.87 38.8 39.51 39.36 39.51 38.8 39.26

## 2014-12-07 NOTE — Assessment & Plan Note (Signed)
Ms. Krista Strickland is reminded of the importance of commitment to daily physical activity for 30 minutes or more, as able and the need to limit carbohydrate intake to 30 to 60 grams per meal to help with blood sugar control.   The need to take medication as prescribed, test blood sugar as directed, and to call between visits if there is a concern that blood sugar is uncontrolled is also discussed.   Ms. Krista Strickland is reminded of the importance of daily foot exam, annual eye examination, and good blood sugar, blood pressure and cholesterol control.  Diabetic Labs Latest Ref Rng 12/06/2014 11/30/2014 10/11/2014 08/06/2014 07/07/2014  HbA1c - - - 8.4 - 8.6(H)  Microalbumin 0.0 - 1.9 mg/dL - - - - -  Micro/Creat Ratio 0.0 - 30.0 mg/g - - - - -  Chol 0 - 200 mg/dL - 196 - - -  HDL >40 mg/dL - 48 - - -  Calc LDL 0 - 99 mg/dL - 129(H) - - -  Triglycerides <150 mg/dL - 95 - - -  Creatinine 0.50 - 0.99 mg/dL 1.72(H) 1.47(H) - 1.57(H) -   BP/Weight 11/30/2014 10/11/2014 09/21/2014 08/10/2014 07/07/2014 Q000111Q 0000000  Systolic BP 123456 0000000 [id number] Q000111Q 0000000 123XX123  Diastolic BP 76 86 80 70 80 84 71  Wt. (Lbs) 225 219 223 222.12 223 219 221.6  BMI 39.87 38.8 39.51 39.36 39.51 38.8 39.26   Foot/eye exam completion dates Latest Ref Rng 10/11/2014 09/01/2014  Eye Exam No Retinopathy - No Retinopathy  Foot exam Order - - -  Foot Form Completion - Done -   Followed by endo, improving but still inadequately controlled

## 2014-12-07 NOTE — Assessment & Plan Note (Signed)
Improved, had recent epidurals which caused signifiant fluctuation in blood sugar

## 2014-12-07 NOTE — Assessment & Plan Note (Signed)
Deteriorated. Patient re-educated about  the importance of commitment to a  minimum of 150 minutes of exercise per week.  The importance of healthy food choices with portion control discussed. Encouraged to start a food diary, count calories and to consider  joining a support group. Sample diet sheets offered. Goals set by the patient for the next several months.   Weight /BMI 11/30/2014 10/11/2014 09/21/2014  WEIGHT 225 lb 219 lb 223 lb  HEIGHT 5\' 3"  5\' 3"  -  BMI 39.87 kg/m2 38.8 kg/m2 39.51 kg/m2    Current exercise per week 90 minutes.

## 2014-12-07 NOTE — Progress Notes (Signed)
Krista Strickland     MRN: [id number]      DOB: [birth year]   HPI Ms. Krista Strickland is here for follow up and re-evaluation of chronic medical conditions, medication management and review of any available recent lab and radiology data.  Main reason for visit is 4 day h/o increased swelling noted in legs, hands and face. Denies change in diet. Denies PND, orthopnea or new exertional fatigue or chest pain Preventive health is updated, specifically  Cancer screening and Immunization.   Questions or concerns regarding consultations or procedures which the PT has had in the interim are  Addressed.Epidural injections improved back pain but have caused worsened blood sugar control The PT denies any adverse reactions to current medications since the last visit.   ROS Denies recent fever or chills. Denies sinus pressure, nasal congestion, ear pain or sore throat. Denies chest congestion, productive cough or wheezing.  Denies abdominal pain, nausea, vomiting,diarrhea or constipation.   Denies dysuria, frequency, hesitancy or incontinence.  Denies headaches, seizures, numbness, or tingling. Denies depression, anxiety or insomnia. Denies skin break down or rash.   PE  BP 146/76 mmHg  Pulse 80  Resp 18  Ht 5\' 3"  (1.6 m)  Wt 225 lb (102.059 kg)  BMI 39.87 kg/m2  SpO2 97%  Patient alert and oriented and in no cardiopulmonary distress.  HEENT: No facial asymmetry, EOMI,   oropharynx pink and moist.  Neck supple no JVD, no mass.  Chest: Clear to auscultation bilaterally.  CVS: S1, S2 no murmurs, no S3.Regular rate.  ABD: Soft non tender.   Ext: one plus pitting  Edema in both lower extremities  MS: Adequate though reduced  ROM spine, normal in  shoulders, hips and knees.  Skin: Intact, no ulcerations or rash noted.  Psych: Good eye contact, normal affect. Memory intact not anxious or depressed appearing.  CNS: CN 2-12 intact, power,  normal throughout.no focal deficits  noted.   Assessment & Plan   Bilateral leg edema 5 day history, increase lasix to two in am, and one in pm for 1 week, then re eval, likley due to nephropathy, no symptoms or signs of CHF   Type 2 diabetes mellitus with pressure callus Ms. Krista Strickland is reminded of the importance of commitment to daily physical activity for 30 minutes or more, as able and the need to limit carbohydrate intake to 30 to 60 grams per meal to help with blood sugar control.   The need to take medication as prescribed, test blood sugar as directed, and to call between visits if there is a concern that blood sugar is uncontrolled is also discussed.   Ms. Krista Strickland is reminded of the importance of daily foot exam, annual eye examination, and good blood sugar, blood pressure and cholesterol control.  Diabetic Labs Latest Ref Rng 12/06/2014 11/30/2014 10/11/2014 08/06/2014 07/07/2014  HbA1c - - - 8.4 - 8.6(H)  Microalbumin 0.0 - 1.9 mg/dL - - - - -  Micro/Creat Ratio 0.0 - 30.0 mg/g - - - - -  Chol 0 - 200 mg/dL - 196 - - -  HDL >40 mg/dL - 48 - - -  Calc LDL 0 - 99 mg/dL - 129(H) - - -  Triglycerides <150 mg/dL - 95 - - -  Creatinine 0.50 - 0.99 mg/dL 1.72(H) 1.47(H) - 1.57(H) -   BP/Weight 11/30/2014 10/11/2014 09/21/2014 08/10/2014 07/07/2014 Q000111Q 0000000  Systolic BP 123456 0000000 [id number] Q000111Q 0000000 [id number]  Diastolic BP 76 86 80 70 80 84  71  Wt. (Lbs) 225 219 223 222.12 223 219 221.6  BMI 39.87 38.8 39.51 39.36 39.51 38.8 39.26   Foot/eye exam completion dates Latest Ref Rng 10/11/2014 09/01/2014  Eye Exam No Retinopathy - No Retinopathy  Foot exam Order - - -  Foot Form Completion - Done -   Followed by endo, improving but still inadequately controlled      Back pain with left-sided sciatica Improved, had recent epidurals which caused signifiant fluctuation in blood sugar  Need for prophylactic vaccination and inoculation against influenza After obtaining informed consent, the vaccine is  administered by  LPN.   Essential hypertension Uncontrolled, hopefully additional lasix will be benficial DASH diet and commitment to daily physical activity for a minimum of 30 minutes discussed and encouraged, as a part of hypertension management. The importance of attaining a healthy weight is also discussed.  BP/Weight 11/30/2014 10/11/2014 09/21/2014 08/10/2014 07/07/2014 Q000111Q 0000000  Systolic BP 123456 0000000 [id number] Q000111Q 0000000 [id number]  Diastolic BP 76 86 80 70 80 84 71  Wt. (Lbs) 225 219 223 222.12 223 219 221.6  BMI 39.87 38.8 39.51 39.36 39.51 38.8 39.26        Morbid obesity Deteriorated. Patient re-educated about  the importance of commitment to a  minimum of 150 minutes of exercise per week.  The importance of healthy food choices with portion control discussed. Encouraged to start a food diary, count calories and to consider  joining a support group. Sample diet sheets offered. Goals set by the patient for the next several months.   Weight /BMI 11/30/2014 10/11/2014 09/21/2014  WEIGHT 225 lb 219 lb 223 lb  HEIGHT 5\' 3"  5\' 3"  -  BMI 39.87 kg/m2 38.8 kg/m2 39.51 kg/m2    Current exercise per week 90 minutes.

## 2014-12-09 LAB — MICROALBUMIN / CREATININE URINE RATIO
CREATININE, URINE: 63 mg/dL (ref 20–320)
MICROALB UR: 6.1 mg/dL
MICROALB/CREAT RATIO: 97 ug/mg{creat} — AB (ref <30)

## 2014-12-19 NOTE — Assessment & Plan Note (Signed)
Followed by endo Reports multiple episodeds of hypoglycemia and has been adjusting her medication. Has attempted contact with endo office , i have advised her to sched appt for re eval if this continues to be a problem

## 2014-12-19 NOTE — Assessment & Plan Note (Signed)
Deteriorated. Patient re-educated about  the importance of commitment to a  minimum of 150 minutes of exercise per week.  The importance of healthy food choices with portion control discussed. Encouraged to start a food diary, count calories and to consider  joining a support group. Sample diet sheets offered. Goals set by the patient for the next several months.   Weight /BMI 12/07/2014 11/30/2014 10/11/2014  WEIGHT 227 lb 225 lb 219 lb  HEIGHT 5\' 3"  5\' 3"  5\' 3"   BMI 40.22 kg/m2 39.87 kg/m2 38.8 kg/m2    Current exercise per week 56minutes.

## 2014-12-19 NOTE — Assessment & Plan Note (Signed)
unchanged , despite increased diuretic dose , no s/s of heart failure , needs nephrology input as soon as possible

## 2014-12-19 NOTE — Assessment & Plan Note (Signed)
No significant response to increased dose of diuretic as far as fluid retention is concerned, weight is unchanged, and renal failure appears worsened

## 2014-12-19 NOTE — Assessment & Plan Note (Signed)
Controlled, no change in medication DASH diet and commitment to daily physical activity for a minimum of 30 minutes discussed and encouraged, as a part of hypertension management. The importance of attaining a healthy weight is also discussed.  BP/Weight 12/07/2014 11/30/2014 10/11/2014 09/21/2014 08/10/2014 07/07/2014 Q000111Q  Systolic BP Q000111Q 123456 0000000 [id number] Q000111Q 0000000  Diastolic BP 68 76 86 80 70 80 84  Wt. (Lbs) 227 225 219 223 222.12 223 219  BMI 40.22 39.87 38.8 39.51 39.36 39.51 38.8

## 2014-12-28 ENCOUNTER — Other Ambulatory Visit: Payer: Self-pay | Admitting: Family Medicine

## 2015-01-10 ENCOUNTER — Other Ambulatory Visit (INDEPENDENT_AMBULATORY_CARE_PROVIDER_SITE_OTHER): Payer: PPO | Admitting: *Deleted

## 2015-01-10 ENCOUNTER — Ambulatory Visit (INDEPENDENT_AMBULATORY_CARE_PROVIDER_SITE_OTHER): Payer: PPO | Admitting: Endocrinology

## 2015-01-10 ENCOUNTER — Encounter: Payer: Self-pay | Admitting: Endocrinology

## 2015-01-10 ENCOUNTER — Ambulatory Visit (HOSPITAL_COMMUNITY)
Admission: RE | Admit: 2015-01-10 | Discharge: 2015-01-10 | Disposition: A | Discharge status: Home / Self Care | Payer: PPO | Source: Ambulatory Visit | Attending: Family Medicine | Admitting: Family Medicine

## 2015-01-10 VITALS — BP 132/88 | HR 77 | Temp 98.1°F | Resp 12 | Wt 228.0 lb

## 2015-01-10 DIAGNOSIS — E1121 Type 2 diabetes mellitus with diabetic nephropathy: Secondary | ICD-10-CM | POA: Diagnosis not present

## 2015-01-10 DIAGNOSIS — Z1231 Encounter for screening mammogram for malignant neoplasm of breast: Secondary | ICD-10-CM | POA: Diagnosis present

## 2015-01-10 LAB — POCT GLYCOSYLATED HEMOGLOBIN (HGB A1C): HEMOGLOBIN A1C: 7.7

## 2015-01-10 MED ORDER — INSULIN NPH ISOPHANE & REGULAR (70-30) 100 UNIT/ML ~~LOC~~ SUSP: 190.0000 [IU] | Freq: Every day | SUBCUTANEOUS | Status: DC

## 2015-01-10 MED ORDER — INSULIN REGULAR HUMAN 100 UNIT/ML IJ SOLN: 10.0000 [IU] | Freq: Every day | INTRAMUSCULAR | Status: DC

## 2015-01-10 NOTE — Patient Instructions (Addendum)
check your blood sugar twice a day.  vary the time of day when you check, between before the 3 meals, and at bedtime.  also check if you have symptoms of your blood sugar being too high or too low.  please keep a record of the readings and bring it to your next appointment here.  You can write it on any piece of paper.  please call us sooner if your blood sugar goes below 70, or if you have a lot of readings over 200.   Please reduce the 70/30 insulin to 190 units with breakfast.  Please continue the same reg insulin with supper.   Please come back for a follow-up appointment in 3 months.

## 2015-01-10 NOTE — Progress Notes (Signed)
Subjective:    Patient ID: Krista Strickland, female    DOB: 1950/12/03, 64 y.o.   MRN: [id number]  HPI DM type: insulin-requiring type 2 Dx'ed: [id number] Complications: polyneuropathy, renal insufficiency and CVA. Therapy: insulin since 2010 GDM: never DKA: never Severe hypoglycemia: never.   Pancreatitis: never.   Other: she declines weight loss surgery; she takes levemir, after cutaneous reactions to NPH and lantus; she has requested a bid insulin regimen, but she cannot afford analogs, so we had to re-try NPH (no reaction this time).   Interval history: Pt says she never misses the insulin.  She takes 70/30, 200 units qam and Reg, 10 units with the evening meal.  no cbg record, but states cbg's are still mildly low in the afternoon, or the middle of the night.  pt states she feels well in general, except during these episodes.  Past Medical History  Diagnosis Date  . Hyperlipidemia   . Obesity   . Diabetes mellitus, type 2 ([place])   . Hypertension   . History of MRSA infection 03/2009    on ant abdomen   . Chronic kidney disease   . Gout   . Psoriasis   . Stroke ([place])   . Coronary artery disease   . GERD (gastroesophageal reflux disease)     Past Surgical History  Procedure Laterality Date  . Partial hysterectomy    . Rt,. neck biopsy    . Excison of rt breast cyst    . Appendectomy  2012  . Laparoscopic salpingo oopherectomy Right 04/22/2012    Procedure: LAPAROSCOPIC SALPINGO OOPHORECTOMY;  Surgeon: Jonnie Kind, MD;  Location: [facility];  Service: Gynecology;  Laterality: Right;  end 11:17  . Mass excision N/A 04/22/2012    Procedure: EXCISION SKIN TAGS NECK AND HEAD;  Surgeon: Jonnie Kind, MD;  Location: [facility];  Service: Gynecology;  Laterality: N/A;  start 11:19    Social History   Social History  . Marital Status: Married    Spouse Name: N/A  . Number of Children: 1  . Years of Education: N/A   Occupational History  . disabled     Social History Main Topics   . Smoking status: Former Research scientist (life sciences)  . Smokeless tobacco: Not on file  . Alcohol Use: No  . Drug Use: No  . Sexual Activity: Yes   Other Topics Concern  . Not on file   Social History Narrative    Current Outpatient Prescriptions on File Prior to Visit  Medication Sig Dispense Refill  . allopurinol (ZYLOPRIM) 300 MG tablet Take 300 mg by mouth daily.      Marland Kitchen aspirin 325 MG tablet Take 325 mg by mouth daily.    . calcitRIOL (ROCALTROL) 0.25 MCG capsule Take 0.25 mcg by mouth every other day.     . cyclobenzaprine (FLEXERIL) 10 MG tablet Take 10 mg by mouth 3 (three) times daily as needed. Muscle Spasms    . diltiazem (DILACOR XR) 180 MG 24 hr capsule TAKE ONE CAPSULE BY MOUTH ONCE DAILY 30 capsule 3  . docusate sodium (COLACE) 100 MG capsule Take 100 mg by mouth daily as needed. Constipation    . ezetimibe (ZETIA) 10 MG tablet Take 1 tablet (10 mg total) by mouth daily. 30 tablet 5  . furosemide (LASIX) 40 MG tablet TAKE ONE TABLET BY MOUTH TWICE DAILY (Patient taking differently: TAKE TWO TABLETS IN THE MORNING AND ONE TABLET AT NIGHT) 60 tablet 0  . HYDROcodone-acetaminophen (NORCO) 7.5-325  MG per tablet Take 1 tablet by mouth every 4 (four) hours as needed. Pain    . metolazone (ZAROXOLYN) 5 MG tablet TAKE ONE TABLET BY MOUTH ONCE DAILY 30 tablet 3  . metoprolol (LOPRESSOR) 100 MG tablet TAKE ONE TABLET BY MOUTH TWICE DAILY 60 tablet 3  . midodrine (PROAMATINE) 5 MG tablet Take 5 mg by mouth daily.    Glory Rosebush DELICA LANCETS 99991111 MISC 1 Device by Does not apply route 2 (two) times daily. 60 each 11  . potassium chloride (K-DUR) 10 MEQ tablet TAKE THREE TABLETS BY MOUTH THREE TIMES DAILY 270 tablet 3  . pravastatin (PRAVACHOL) 20 MG tablet TAKE ONE TABLET BY MOUTH ONCE DAILY WITH BREAKFAST 30 tablet 3  . spironolactone (ALDACTONE) 25 MG tablet Take 1 tablet (25 mg total) by mouth daily. 30 tablet 5  . VOLTAREN 1 % GEL Apply 4 g topically 3 (three) times daily.     No current  facility-administered medications on file prior to visit.    Allergies  Allergen Reactions  . Benazepril Swelling  . Fish Allergy   . Metronidazole Hives  . Mobic [Meloxicam] Hives  . Penicillins Swelling  . Sulfonamide Derivatives Hives    Family History  Problem Relation Age of Onset  . Gout Sister   . Hypertension Brother   . Gout Brother   . Hypertension Brother   . Diabetes Neg Hx   . Hypertension Sister   . Gout Sister     BP 132/88 mmHg  Pulse 77  Temp(Src) 98.1 F (36.7 C) (Oral)  Resp 12  Wt 228 lb (103.42 kg)  SpO2 95%  Review of Systems Denies LOC    Objective:   Physical Exam VITAL SIGNS:  See vs page GENERAL: no distress SKIN:  Insulin injection sites at the anterior abdomen are normal, except for a few ecchymoses.     A1c=7.7%    Assessment & Plan:  DM: this is the best control this pt should aim for, given this regimen, which does match insulin to her changing needs throughout the day  Patient is advised the following: Patient Instructions  check your blood sugar twice a day.  vary the time of day when you check, between before the 3 meals, and at bedtime.  also check if you have symptoms of your blood sugar being too high or too low.  please keep a record of the readings and bring it to your next appointment here.  You can write it on any piece of paper.  please call us sooner if your blood sugar goes below 70, or if you have a lot of readings over 200.   Please reduce the 70/30 insulin to 190 units with breakfast.  Please continue the same reg insulin with supper.   Please come back for a follow-up appointment in 3 months.

## 2015-01-11 ENCOUNTER — Encounter: Payer: PPO | Admitting: Family Medicine

## 2015-01-30 HISTORY — DX: Hypercalcemia: E83.52

## 2015-02-13 ENCOUNTER — Other Ambulatory Visit: Payer: Self-pay | Admitting: Family Medicine

## 2015-02-26 ENCOUNTER — Other Ambulatory Visit: Payer: Self-pay | Admitting: "Endocrinology

## 2015-02-27 NOTE — Telephone Encounter (Signed)
No message sent

## 2015-03-02 ENCOUNTER — Other Ambulatory Visit: Payer: Self-pay

## 2015-03-02 MED ORDER — GLUCOSE BLOOD VI STRP: ORAL_STRIP | Status: DC

## 2015-03-08 DIAGNOSIS — M4806 Spinal stenosis, lumbar region: Secondary | ICD-10-CM | POA: Diagnosis not present

## 2015-03-08 DIAGNOSIS — M5416 Radiculopathy, lumbar region: Secondary | ICD-10-CM | POA: Diagnosis not present

## 2015-03-15 ENCOUNTER — Other Ambulatory Visit: Payer: Self-pay

## 2015-03-15 MED ORDER — "INSULIN SYRINGE 30G X 5/16"" 1 ML MISC": Status: DC

## 2015-03-18 DIAGNOSIS — Z79899 Other long term (current) drug therapy: Secondary | ICD-10-CM | POA: Diagnosis not present

## 2015-03-18 DIAGNOSIS — N183 Chronic kidney disease, stage 3 (moderate): Secondary | ICD-10-CM | POA: Diagnosis not present

## 2015-03-18 DIAGNOSIS — E559 Vitamin D deficiency, unspecified: Secondary | ICD-10-CM | POA: Diagnosis not present

## 2015-03-18 DIAGNOSIS — D509 Iron deficiency anemia, unspecified: Secondary | ICD-10-CM | POA: Diagnosis not present

## 2015-03-18 DIAGNOSIS — I10 Essential (primary) hypertension: Secondary | ICD-10-CM | POA: Diagnosis not present

## 2015-03-18 DIAGNOSIS — R809 Proteinuria, unspecified: Secondary | ICD-10-CM | POA: Diagnosis not present

## 2015-03-23 DIAGNOSIS — I509 Heart failure, unspecified: Secondary | ICD-10-CM | POA: Diagnosis not present

## 2015-03-23 DIAGNOSIS — E1129 Type 2 diabetes mellitus with other diabetic kidney complication: Secondary | ICD-10-CM | POA: Diagnosis not present

## 2015-03-23 DIAGNOSIS — N184 Chronic kidney disease, stage 4 (severe): Secondary | ICD-10-CM | POA: Diagnosis not present

## 2015-03-23 DIAGNOSIS — D649 Anemia, unspecified: Secondary | ICD-10-CM | POA: Diagnosis not present

## 2015-03-25 ENCOUNTER — Other Ambulatory Visit: Payer: Self-pay | Admitting: Family Medicine

## 2015-03-25 ENCOUNTER — Telehealth: Payer: Self-pay | Admitting: Orthopaedic Surgery

## 2015-03-28 NOTE — Telephone Encounter (Signed)
Rx done. 

## 2015-04-04 DIAGNOSIS — D649 Anemia, unspecified: Secondary | ICD-10-CM | POA: Diagnosis not present

## 2015-04-04 DIAGNOSIS — E559 Vitamin D deficiency, unspecified: Secondary | ICD-10-CM | POA: Diagnosis not present

## 2015-04-04 DIAGNOSIS — N183 Chronic kidney disease, stage 3 (moderate): Secondary | ICD-10-CM | POA: Diagnosis not present

## 2015-04-04 DIAGNOSIS — R809 Proteinuria, unspecified: Secondary | ICD-10-CM | POA: Diagnosis not present

## 2015-04-04 DIAGNOSIS — Z79899 Other long term (current) drug therapy: Secondary | ICD-10-CM | POA: Diagnosis not present

## 2015-04-04 DIAGNOSIS — I10 Essential (primary) hypertension: Secondary | ICD-10-CM | POA: Diagnosis not present

## 2015-04-06 DIAGNOSIS — E1129 Type 2 diabetes mellitus with other diabetic kidney complication: Secondary | ICD-10-CM | POA: Diagnosis not present

## 2015-04-06 DIAGNOSIS — I509 Heart failure, unspecified: Secondary | ICD-10-CM | POA: Diagnosis not present

## 2015-04-06 DIAGNOSIS — N183 Chronic kidney disease, stage 3 (moderate): Secondary | ICD-10-CM | POA: Diagnosis not present

## 2015-04-06 DIAGNOSIS — D649 Anemia, unspecified: Secondary | ICD-10-CM | POA: Diagnosis not present

## 2015-04-10 DIAGNOSIS — E119 Type 2 diabetes mellitus without complications: Secondary | ICD-10-CM | POA: Insufficient documentation

## 2015-04-10 NOTE — Progress Notes (Signed)
Subjective:    Patient ID: Krista Strickland, female    DOB: [birth year], 65 y.o.   MRN: [id number]  HPI DM type: insulin-requiring type 2 Dx'ed: [id number] Complications: polyneuropathy, renal insufficiency and CVA. Therapy: insulin since 2010 GDM: never DKA: never Severe hypoglycemia: never.   Pancreatitis: never.   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 she cannot afford analogs, so we had to try 70/30 (the NPH caused no reaction this time).   Interval history: Pt says she never misses the insulin.  She takes 70/30, 190 units qam and Reg, 10 units with the evening meal.  no cbg record, but states cbg's are well-controlled.  pt states she feels well in general.  She says she never misses the insulin.   Past Medical History  Diagnosis Date  . Hyperlipidemia   . Obesity   . Diabetes mellitus, type 2 ([place])   . Hypertension   . History of MRSA infection 03/2009    on ant abdomen   . Chronic kidney disease   . Gout   . Psoriasis   . Stroke ([place])   . Coronary artery disease   . GERD (gastroesophageal reflux disease)     Past Surgical History  Procedure Laterality Date  . Partial hysterectomy    . Rt,. neck biopsy    . Excison of rt breast cyst    . Appendectomy  2012  . Laparoscopic salpingo oopherectomy Right 04/22/2012    Procedure: LAPAROSCOPIC SALPINGO OOPHORECTOMY;  Surgeon: Jonnie Kind, MD;  Location: [facility];  Service: Gynecology;  Laterality: Right;  end 11:17  . Mass excision N/A 04/22/2012    Procedure: EXCISION SKIN TAGS NECK AND HEAD;  Surgeon: Jonnie Kind, MD;  Location: [facility];  Service: Gynecology;  Laterality: N/A;  start 11:19    Social History   Social History  . Marital Status: Married    Spouse Name: N/A  . Number of Children: 1  . Years of Education: N/A   Occupational History  . disabled     Social History Main Topics  . Smoking status: Former Research scientist (life sciences)  .  Smokeless tobacco: Not on file  . Alcohol Use: No  . Drug Use: No  . Sexual Activity: Yes   Other Topics Concern  . Not on file   Social History Narrative    Current Outpatient Prescriptions on File Prior to Visit  Medication Sig Dispense Refill  . allopurinol (ZYLOPRIM) 300 MG tablet TAKE ONE TABLET BY MOUTH ONCE DAILY 30 tablet 5  . aspirin 325 MG tablet Take 325 mg by mouth daily.    . calcitRIOL (ROCALTROL) 0.25 MCG capsule Take 0.25 mcg by mouth every other day.     . cyclobenzaprine (FLEXERIL) 10 MG tablet Take 10 mg by mouth 3 (three) times daily as needed. Muscle Spasms    . diltiazem (DILACOR XR) 180 MG 24 hr capsule TAKE ONE CAPSULE BY MOUTH ONCE DAILY 30 capsule 3  . docusate sodium (COLACE) 100 MG capsule Take 100 mg by mouth daily as needed. Constipation    . glucose blood (ONE TOUCH ULTRA TEST) test strip Use to check blood sugar 2 times per day. DX CODE: e11.9 100 each 5  . HYDROcodone-acetaminophen (NORCO) 7.5-325 MG per tablet Take 1 tablet by mouth every 4 (four) hours as needed. Pain    . insulin NPH-regular Human (NOVOLIN 70/30) (70-30) 100 UNIT/ML injection Inject 190 Units into  the skin daily with breakfast. 90 mL 11  . Insulin Syringe-Needle U-100 (INSULIN SYRINGE 1CC/30GX5/16") 30G X 5/16" 1 ML MISC Use to inject insulin 3 times per day. 150 each 2  . metolazone (ZAROXOLYN) 5 MG tablet TAKE ONE TABLET BY MOUTH ONCE DAILY 30 tablet 3  . metoprolol (LOPRESSOR) 100 MG tablet TAKE ONE TABLET BY MOUTH TWICE DAILY 60 tablet 3  . midodrine (PROAMATINE) 5 MG tablet Take 5 mg by mouth daily.    Glory Rosebush DELICA LANCETS 99991111 MISC 1 Device by Does not apply route 2 (two) times daily. 60 each 11  . potassium chloride (K-DUR) 10 MEQ tablet TAKE THREE TABLETS BY MOUTH THREE TIMES DAILY 270 tablet 3  . pravastatin (PRAVACHOL) 20 MG tablet TAKE ONE TABLET BY MOUTH ONCE DAILY WITH BREAKFAST 30 tablet 3  . spironolactone (ALDACTONE) 25 MG tablet TAKE ONE TABLET BY MOUTH ONCE DAILY  30 tablet 2  . VOLTAREN 1 % GEL Apply 4 g topically 3 (three) times daily.    Marland Kitchen ZETIA 10 MG tablet TAKE ONE TABLET BY MOUTH ONCE DAILY 30 tablet 3   No current facility-administered medications on file prior to visit.    Allergies  Allergen Reactions  . Benazepril Swelling  . Fish Allergy   . Metronidazole Hives  . Mobic [Meloxicam] Hives  . Penicillins Swelling  . Sulfonamide Derivatives Hives    Family History  Problem Relation Age of Onset  . Gout Sister   . Hypertension Brother   . Gout Brother   . Hypertension Brother   . Diabetes Neg Hx   . Hypertension Sister   . Gout Sister     BP 120/76 mmHg  Pulse 83  Temp(Src) 98.3 F (36.8 C) (Oral)  Resp 12  Wt 221 lb 12.8 oz (100.608 kg)  SpO2 93%  Review of Systems She denies hypoglycemia.      Objective:   Physical Exam VITAL SIGNS:  See vs page GENERAL: no distress.  Pulses: dorsalis pedis intact bilat.  MSK: no deformity of the feet CV: 2+ bilat leg edema Skin: no ulcer on the feet. normal color and temp on the feet. Neuro: sensation is intact to touch on the feet.  Ext: There is bilateral onychomycosis of the toenails.   A1c=8.2%  Lab Results  Component Value Date   CREATININE 1.72* 12/06/2014   BUN 52* 12/06/2014   NA 143 12/06/2014   K 4.3 12/06/2014   CL 99 12/06/2014   CO2 33* 12/06/2014      Assessment & Plan:  DM: slightly worse.  Renal insufficiency: in this setting, she does not need a slower-release premixed insulin.    Patient is advised the following: Patient Instructions  check your blood sugar twice a day.  vary the time of day when you check, between before the 3 meals, and at bedtime.  also check if you have symptoms of your blood sugar being too high or too low.  please keep a record of the readings and bring it to your next appointment here.  You can write it on any piece of paper.  please call us sooner if your blood sugar goes below 70, or if you have a lot of readings  over 200.   Please continue the same 70/30 insulin (190 units with breakfast).  Please increase the reg insulin to 20 units with supper.   On this type of insulin schedule, you should eat meals on a regular schedule.  If a meal is missed  or significantly delayed, your blood sugar could go low.  Please come back for a follow-up appointment in 2 months.

## 2015-04-10 NOTE — Patient Instructions (Addendum)
check your blood sugar twice a day.  vary the time of day when you check, between before the 3 meals, and at bedtime.  also check if you have symptoms of your blood sugar being too high or too low.  please keep a record of the readings and bring it to your next appointment here.  You can write it on any piece of paper.  please call us sooner if your blood sugar goes below 70, or if you have a lot of readings over 200.   Please continue the same 70/30 insulin (190 units with breakfast).  Please increase the reg insulin to 20 units with supper.   On this type of insulin schedule, you should eat meals on a regular schedule.  If a meal is missed or significantly delayed, your blood sugar could go low.  Please come back for a follow-up appointment in 2 months.

## 2015-04-11 ENCOUNTER — Encounter: Payer: Self-pay | Admitting: Endocrinology

## 2015-04-11 ENCOUNTER — Ambulatory Visit (INDEPENDENT_AMBULATORY_CARE_PROVIDER_SITE_OTHER): Payer: PPO | Admitting: Endocrinology

## 2015-04-11 ENCOUNTER — Other Ambulatory Visit (INDEPENDENT_AMBULATORY_CARE_PROVIDER_SITE_OTHER): Payer: PPO | Admitting: *Deleted

## 2015-04-11 ENCOUNTER — Encounter: Payer: PPO | Admitting: Family Medicine

## 2015-04-11 VITALS — BP 120/76 | HR 83 | Temp 98.3°F | Resp 12 | Wt 221.8 lb

## 2015-04-11 DIAGNOSIS — Z794 Long term (current) use of insulin: Secondary | ICD-10-CM

## 2015-04-11 DIAGNOSIS — L84 Corns and callosities: Secondary | ICD-10-CM

## 2015-04-11 DIAGNOSIS — E11628 Type 2 diabetes mellitus with other skin complications: Secondary | ICD-10-CM

## 2015-04-11 DIAGNOSIS — N183 Chronic kidney disease, stage 3 (moderate): Secondary | ICD-10-CM | POA: Diagnosis not present

## 2015-04-11 DIAGNOSIS — E1122 Type 2 diabetes mellitus with diabetic chronic kidney disease: Secondary | ICD-10-CM

## 2015-04-11 LAB — POCT GLYCOSYLATED HEMOGLOBIN (HGB A1C): Hemoglobin A1C: 8.2

## 2015-04-11 MED ORDER — INSULIN REGULAR HUMAN 100 UNIT/ML IJ SOLN: 20.0000 [IU] | Freq: Every day | INTRAMUSCULAR | Status: DC

## 2015-04-23 ENCOUNTER — Other Ambulatory Visit: Payer: Self-pay | Admitting: Family Medicine

## 2015-05-26 DIAGNOSIS — M4806 Spinal stenosis, lumbar region: Secondary | ICD-10-CM | POA: Diagnosis not present

## 2015-05-26 DIAGNOSIS — M5416 Radiculopathy, lumbar region: Secondary | ICD-10-CM | POA: Diagnosis not present

## 2015-05-27 ENCOUNTER — Other Ambulatory Visit: Payer: Self-pay | Admitting: Family Medicine

## 2015-06-01 DIAGNOSIS — M4806 Spinal stenosis, lumbar region: Secondary | ICD-10-CM | POA: Diagnosis not present

## 2015-06-09 ENCOUNTER — Other Ambulatory Visit: Payer: Self-pay | Admitting: Neurosurgery

## 2015-06-09 DIAGNOSIS — M48061 Spinal stenosis, lumbar region without neurogenic claudication: Secondary | ICD-10-CM

## 2015-06-13 ENCOUNTER — Ambulatory Visit (INDEPENDENT_AMBULATORY_CARE_PROVIDER_SITE_OTHER): Payer: PPO | Admitting: Endocrinology

## 2015-06-13 ENCOUNTER — Encounter: Payer: Self-pay | Admitting: Endocrinology

## 2015-06-13 VITALS — BP 136/84 | HR 80 | Ht 63.0 in | Wt 210.0 lb

## 2015-06-13 DIAGNOSIS — L84 Corns and callosities: Secondary | ICD-10-CM

## 2015-06-13 DIAGNOSIS — E11628 Type 2 diabetes mellitus with other skin complications: Secondary | ICD-10-CM

## 2015-06-13 LAB — POCT GLYCOSYLATED HEMOGLOBIN (HGB A1C): HEMOGLOBIN A1C: 8.5

## 2015-06-13 MED ORDER — INSULIN REGULAR HUMAN 100 UNIT/ML IJ SOLN: 30.0000 [IU] | Freq: Every day | INTRAMUSCULAR | Status: DC

## 2015-06-13 MED ORDER — INSULIN NPH ISOPHANE & REGULAR (70-30) 100 UNIT/ML ~~LOC~~ SUSP: 180.0000 [IU] | Freq: Every day | SUBCUTANEOUS | Status: DC

## 2015-06-13 NOTE — Patient Instructions (Addendum)
check your blood sugar twice a day.  vary the time of day when you check, between before the 3 meals, and at bedtime.  also check if you have symptoms of your blood sugar being too high or too low.  please keep a record of the readings and bring it to your next appointment here.  You can write it on any piece of paper.  please call us sooner if your blood sugar goes below 70, or if you have a lot of readings over 200.   Please reduce 70/30 insulin to 180 units with breakfast, and increase the reg insulin to 30 units with supper.   On this type of insulin schedule, you should eat meals on a regular schedule.  If a meal is missed or significantly delayed, your blood sugar could go low.  Please come back for a follow-up appointment in 2 months.

## 2015-06-13 NOTE — Progress Notes (Signed)
Subjective:    Patient ID: Krista Strickland, female    DOB: 04/10/1950, 65 y.o.   MRN: [id number]  HPI DM type: insulin-requiring type 2 Dx'ed: [id number] Complications: polyneuropathy, renal insufficiency and CVA. Therapy: insulin since 2010 GDM: never DKA: never Severe hypoglycemia: never.   Pancreatitis: never.   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 she cannot afford analogs, so we had to try 70/30 (the NPH caused no reaction this time).   Interval history:  pt states she feels well in general.  She says she never misses the insulin.  Pt says she has mild hypoglycemia in the afternoon, approx twice a week, in the afternoon.  She says cbg is highest at hs (200).   Past Medical History  Diagnosis Date  . Hyperlipidemia   . Obesity   . Diabetes mellitus, type 2 ([place])   . Hypertension   . History of MRSA infection 03/2009    on ant abdomen   . Chronic kidney disease   . Gout   . Psoriasis   . Stroke ([place])   . Coronary artery disease   . GERD (gastroesophageal reflux disease)     Past Surgical History  Procedure Laterality Date  . Partial hysterectomy    . Rt,. neck biopsy    . Excison of rt breast cyst    . Appendectomy  2012  . Laparoscopic salpingo oopherectomy Right 04/22/2012    Procedure: LAPAROSCOPIC SALPINGO OOPHORECTOMY;  Surgeon: Jonnie Kind, MD;  Location: [facility];  Service: Gynecology;  Laterality: Right;  end 11:17  . Mass excision N/A 04/22/2012    Procedure: EXCISION SKIN TAGS NECK AND HEAD;  Surgeon: Jonnie Kind, MD;  Location: [facility];  Service: Gynecology;  Laterality: N/A;  start 11:19    Social History   Social History  . Marital Status: Married    Spouse Name: N/A  . Number of Children: 1  . Years of Education: N/A   Occupational History  . disabled     Social History Main Topics  . Smoking status: Former Research scientist (life sciences)  . Smokeless tobacco: Not on file  .  Alcohol Use: No  . Drug Use: No  . Sexual Activity: Yes   Other Topics Concern  . Not on file   Social History Narrative    Current Outpatient Prescriptions on File Prior to Visit  Medication Sig Dispense Refill  . allopurinol (ZYLOPRIM) 300 MG tablet TAKE ONE TABLET BY MOUTH ONCE DAILY 30 tablet 5  . aspirin 325 MG tablet Take 325 mg by mouth daily.    . cyclobenzaprine (FLEXERIL) 10 MG tablet Take 10 mg by mouth 3 (three) times daily as needed. Muscle Spasms    . diltiazem (DILACOR XR) 180 MG 24 hr capsule TAKE ONE CAPSULE BY MOUTH ONCE DAILY 30 capsule 3  . docusate sodium (COLACE) 100 MG capsule Take 100 mg by mouth daily as needed. Constipation    . glucose blood (ONE TOUCH ULTRA TEST) test strip Use to check blood sugar 2 times per day. DX CODE: e11.9 100 each 5  . HYDROcodone-acetaminophen (NORCO) 7.5-325 MG per tablet Take 1 tablet by mouth every 4 (four) hours as needed. Pain    . Insulin Syringe-Needle U-100 (INSULIN SYRINGE 1CC/30GX5/16") 30G X 5/16" 1 ML MISC Use to inject insulin 3 times per day. 150 each 2  . metolazone (ZAROXOLYN) 5 MG tablet TAKE ONE TABLET BY MOUTH ONCE  DAILY 30 tablet 3  . metoprolol (LOPRESSOR) 100 MG tablet TAKE ONE TABLET BY MOUTH TWICE DAILY 60 tablet 3  . midodrine (PROAMATINE) 5 MG tablet Take 5 mg by mouth daily.    Glory Rosebush DELICA LANCETS 99991111 MISC 1 Device by Does not apply route 2 (two) times daily. 60 each 11  . potassium chloride (K-DUR) 10 MEQ tablet TAKE THREE TABLETS BY MOUTH THREE TIMES DAILY 270 tablet 3  . pravastatin (PRAVACHOL) 20 MG tablet TAKE ONE TABLET BY MOUTH ONCE DAILY WITH  BREAKFAST 30 tablet 3  . spironolactone (ALDACTONE) 25 MG tablet TAKE ONE TABLET BY MOUTH ONCE DAILY 30 tablet 3  . torsemide (DEMADEX) 20 MG tablet Take 20 mg by mouth. TAKE 2 TABLETS EVERY OTHER DAY AND THEN 4 TABLETS EVERY OTHER DAY    . VOLTAREN 1 % GEL Apply 4 g topically 3 (three) times daily.    Marland Kitchen ZETIA 10 MG tablet TAKE ONE TABLET BY MOUTH ONCE  DAILY 30 tablet 3  . calcitRIOL (ROCALTROL) 0.25 MCG capsule Take 0.25 mcg by mouth every other day. Reported on 06/13/2015     No current facility-administered medications on file prior to visit.    Allergies  Allergen Reactions  . Benazepril Swelling  . Fish Allergy   . Metronidazole Hives  . Mobic [Meloxicam] Hives  . Penicillins Swelling  . Sulfonamide Derivatives Hives    Family History  Problem Relation Age of Onset  . Gout Sister   . Hypertension Brother   . Gout Brother   . Hypertension Brother   . Diabetes Neg Hx   . Hypertension Sister   . Gout Sister     BP 136/84 mmHg  Pulse 80  Ht 5\' 3"  (1.6 m)  Wt 210 lb (95.255 kg)  BMI 37.21 kg/m2  SpO2 96%  Review of Systems Denies LOC.      Objective:   Physical Exam VITAL SIGNS:  See vs page GENERAL: no distress.  Pulses: dorsalis pedis intact bilat.  MSK: no deformity of the feet CV: 2+ bilat leg edema Skin: no ulcer on the feet. normal color and temp on the feet. Neuro: sensation is intact to touch on the feet.  Ext: There is bilateral onychomycosis of the toenails.    A1c=8.5%    Assessment & Plan:  DM: Based on the pattern of her cbg's, she needs some adjustment in her therapy  Patient is advised the following: Patient Instructions  check your blood sugar twice a day.  vary the time of day when you check, between before the 3 meals, and at bedtime.  also check if you have symptoms of your blood sugar being too high or too low.  please keep a record of the readings and bring it to your next appointment here.  You can write it on any piece of paper.  please call us sooner if your blood sugar goes below 70, or if you have a lot of readings over 200.   Please reduce 70/30 insulin to 180 units with breakfast, and increase the reg insulin to 30 units with supper.   On this type of insulin schedule, you should eat meals on a regular schedule.  If a meal is missed or significantly delayed, your blood  sugar could go low.  Please come back for a follow-up appointment in 2 months.

## 2015-06-17 ENCOUNTER — Ambulatory Visit
Admission: RE | Admit: 2015-06-17 | Discharge: 2015-06-17 | Disposition: A | Discharge status: Home / Self Care | Payer: PPO | Source: Ambulatory Visit | Attending: Neurosurgery | Admitting: Neurosurgery

## 2015-06-17 VITALS — BP 110/50 | HR 68

## 2015-06-17 DIAGNOSIS — M5432 Sciatica, left side: Secondary | ICD-10-CM

## 2015-06-17 DIAGNOSIS — M5431 Sciatica, right side: Secondary | ICD-10-CM

## 2015-06-17 DIAGNOSIS — M4806 Spinal stenosis, lumbar region: Secondary | ICD-10-CM | POA: Diagnosis not present

## 2015-06-17 DIAGNOSIS — M48061 Spinal stenosis, lumbar region without neurogenic claudication: Secondary | ICD-10-CM

## 2015-06-17 MED ORDER — MEPERIDINE HCL 100 MG/ML IJ SOLN
75.0000 mg | Freq: Once | INTRAMUSCULAR | Status: AC
  Administered 2015-06-17: 75 mg via INTRAMUSCULAR

## 2015-06-17 MED ORDER — DIAZEPAM 5 MG PO TABS
5.0000 mg | ORAL_TABLET | Freq: Once | ORAL | Status: AC
  Administered 2015-06-17: 5 mg via ORAL

## 2015-06-17 MED ORDER — IOPAMIDOL (ISOVUE-M 200) INJECTION 41%
15.0000 mL | Freq: Once | INTRAMUSCULAR | Status: AC
  Administered 2015-06-17: 15 mL via INTRATHECAL

## 2015-06-17 MED ORDER — ONDANSETRON HCL 4 MG/2ML IJ SOLN
4.0000 mg | Freq: Once | INTRAMUSCULAR | Status: AC
  Administered 2015-06-17: 4 mg via INTRAMUSCULAR

## 2015-06-17 NOTE — Discharge Instructions (Signed)

## 2015-06-23 ENCOUNTER — Other Ambulatory Visit: Payer: Self-pay | Admitting: Family Medicine

## 2015-06-24 DIAGNOSIS — E559 Vitamin D deficiency, unspecified: Secondary | ICD-10-CM | POA: Diagnosis not present

## 2015-06-24 DIAGNOSIS — I10 Essential (primary) hypertension: Secondary | ICD-10-CM | POA: Diagnosis not present

## 2015-06-24 DIAGNOSIS — R809 Proteinuria, unspecified: Secondary | ICD-10-CM | POA: Diagnosis not present

## 2015-06-24 DIAGNOSIS — D509 Iron deficiency anemia, unspecified: Secondary | ICD-10-CM | POA: Diagnosis not present

## 2015-06-24 DIAGNOSIS — Z79899 Other long term (current) drug therapy: Secondary | ICD-10-CM | POA: Diagnosis not present

## 2015-06-24 DIAGNOSIS — N183 Chronic kidney disease, stage 3 (moderate): Secondary | ICD-10-CM | POA: Diagnosis not present

## 2015-06-28 DIAGNOSIS — I509 Heart failure, unspecified: Secondary | ICD-10-CM | POA: Diagnosis not present

## 2015-06-28 DIAGNOSIS — R809 Proteinuria, unspecified: Secondary | ICD-10-CM | POA: Diagnosis not present

## 2015-06-28 DIAGNOSIS — N183 Chronic kidney disease, stage 3 (moderate): Secondary | ICD-10-CM | POA: Diagnosis not present

## 2015-06-28 DIAGNOSIS — D638 Anemia in other chronic diseases classified elsewhere: Secondary | ICD-10-CM | POA: Diagnosis not present

## 2015-06-28 DIAGNOSIS — E1129 Type 2 diabetes mellitus with other diabetic kidney complication: Secondary | ICD-10-CM | POA: Diagnosis not present

## 2015-07-04 DIAGNOSIS — I639 Cerebral infarction, unspecified: Secondary | ICD-10-CM | POA: Diagnosis not present

## 2015-07-04 DIAGNOSIS — E785 Hyperlipidemia, unspecified: Secondary | ICD-10-CM | POA: Diagnosis not present

## 2015-07-04 DIAGNOSIS — I951 Orthostatic hypotension: Secondary | ICD-10-CM | POA: Diagnosis not present

## 2015-07-04 DIAGNOSIS — E119 Type 2 diabetes mellitus without complications: Secondary | ICD-10-CM | POA: Diagnosis not present

## 2015-07-04 DIAGNOSIS — R2681 Unsteadiness on feet: Secondary | ICD-10-CM | POA: Diagnosis not present

## 2015-07-05 DIAGNOSIS — N183 Chronic kidney disease, stage 3 (moderate): Secondary | ICD-10-CM | POA: Diagnosis not present

## 2015-07-05 DIAGNOSIS — Z79899 Other long term (current) drug therapy: Secondary | ICD-10-CM | POA: Diagnosis not present

## 2015-07-05 DIAGNOSIS — I10 Essential (primary) hypertension: Secondary | ICD-10-CM | POA: Diagnosis not present

## 2015-07-20 ENCOUNTER — Encounter (INDEPENDENT_AMBULATORY_CARE_PROVIDER_SITE_OTHER): Payer: Self-pay

## 2015-07-20 ENCOUNTER — Ambulatory Visit (INDEPENDENT_AMBULATORY_CARE_PROVIDER_SITE_OTHER): Payer: PPO | Admitting: Family Medicine

## 2015-07-20 ENCOUNTER — Encounter: Payer: Self-pay | Admitting: Family Medicine

## 2015-07-20 VITALS — BP 130/70 | HR 71 | Resp 16 | Ht 63.0 in | Wt 209.0 lb

## 2015-07-20 DIAGNOSIS — E785 Hyperlipidemia, unspecified: Secondary | ICD-10-CM

## 2015-07-20 DIAGNOSIS — I10 Essential (primary) hypertension: Secondary | ICD-10-CM

## 2015-07-20 DIAGNOSIS — Z23 Encounter for immunization: Secondary | ICD-10-CM | POA: Diagnosis not present

## 2015-07-20 DIAGNOSIS — Z0000 Encounter for general adult medical examination without abnormal findings: Secondary | ICD-10-CM | POA: Diagnosis not present

## 2015-07-20 DIAGNOSIS — Z1159 Encounter for screening for other viral diseases: Secondary | ICD-10-CM

## 2015-07-20 DIAGNOSIS — Z13820 Encounter for screening for osteoporosis: Secondary | ICD-10-CM

## 2015-07-20 DIAGNOSIS — E559 Vitamin D deficiency, unspecified: Secondary | ICD-10-CM | POA: Diagnosis not present

## 2015-07-20 NOTE — Patient Instructions (Signed)
Annual exam in 3.5 month, call if you need me before  Pneumonia 23 vsccine today  Labs today  You are referred for bone density test  Please work on good  health habits so that your health will improve. 1. Commitment to daily physical activity for 30 to 60  minutes, if you are able to do this.  2. Commitment to wise food choices. Aim for half of your  food intake to be vegetable and fruit, one quarter starchy foods, and one quarter protein. Try to eat on a regular schedule  3 meals per day, snacking between meals should be limited to vegetables or fruits or small portions of nuts. 64 ounces of water per day is generally recommended, unless you have specific health conditions, like heart failure or kidney failure where you will need to limit fluid intake.  3. Commitment to sufficient and a  good quality of physical and mental rest daily, generally between 6 to 8 hours per day.  WITH PERSISTANCE AND PERSEVERANCE, THE IMPOSSIBLE , BECOMES THE NORM!  Thank you  for choosing [facility]. We consider it a privelige to serve you.  Delivering excellent health care in a caring and  compassionate way is our goal.  Partnering with you,  so that together we can achieve this goal is our strategy.

## 2015-07-20 NOTE — Progress Notes (Signed)
Preventive Screening-Counseling & Management   Patient present here today for a Medicare annual wellness visit.   Current Problems (verified)   Medications Prior to Visit Allergies (verified)   PAST HISTORY  Family History (verified)   Social History Married 34 years, 2 children, retired    Risk Factors  Current exercise habits:  Walks a few days a week for 30 minutes   Dietary issues discussed: heart healthy diet discussed, limiting fried fatty foods and red meat and carbs    Cardiac risk factors:  Type 2 dm   Depression Screen  (Note: if answer to either of the following is "Yes", a more complete depression screening is indicated)   Over the past two weeks, have you felt down, depressed or hopeless? No  Over the past two weeks, have you felt little interest or pleasure in doing things? No  Have you lost interest or pleasure in daily life? No  Do you often feel hopeless? No  Do you cry easily over simple problems? No   Activities of Daily Living  In your present state of health, do you have any difficulty performing the following activities?  Driving?: No Managing money?: No Feeding yourself?:No Getting from bed to chair?:No Climbing a flight of stairs?: not well due to hip pain  Preparing food and eating?:No Bathing or showering?: no, uses rail and shower chair  Getting dressed?:No Getting to the toilet?:No Using the toilet?:No Moving around from place to place?: No  Fall Risk Assessment In the past year have you fallen or had a near fall?:No Are you currently taking any medications that make you dizzy?:No   Hearing Difficulties: No Do you often ask people to speak up or repeat themselves?:No Do you experience ringing or noises in your ears?:No Do you have difficulty understanding soft or whispered voices?:No  Cognitive Testing  Alert? Yes Normal Appearance?Yes  Oriented to person? Yes Place? Yes  Time? Yes  Displays appropriate judgment?Yes  Can read  the correct time from a watch face? yes Are you having problems remembering things?No  Advanced Directives have been discussed with the patient?Yes, will given another brochure , full code   List the Names of Other Physician/Practitioners you currently use: updated    Indicate any recent Medical Services you may have received from other than [facility] providers in the past year (date may be approximate).   Assessment:    Annual Wellness Exam   Plan:      Patient Instructions (the written plan) was given to the patient.  Medicare Attestation  I have personally reviewed:  The patient's medical and social history  Their use of alcohol, tobacco or illicit drugs  Their current medications and supplements  The patient's functional ability including ADLs,fall risks, home safety risks, cognitive, and hearing and visual impairment  Diet and physical activities  Evidence for depression or mood disorders  The patient's weight, height, BMI, and visual acuity have been recorded in the chart. I have made referrals, counseling, and provided education to the patient based on review of the above and I have provided the patient with a written personalized care plan for preventive services.    BP 130/70 mmHg  Pulse 71  Resp 16  Ht 5\' 3"  (1.6 m)  Wt 209 lb (94.802 kg)  BMI 37.03 kg/m2  SpO2 98%   Medicare annual wellness visit, subsequent Annual exam as documented. Counseling done  re healthy lifestyle involving commitment to 150 minutes exercise per week, heart healthy diet, and attaining healthy  weight.The importance of adequate sleep also discussed. Regular seat belt use and home safety, is also discussed. Changes in health habits are decided on by the patient with goals and time frames  set for achieving them. Immunization and cancer screening needs are specifically addressed at this visit.

## 2015-07-21 LAB — LIPID PANEL
CHOL/HDL RATIO: 4.8 ratio (ref <=5.0)
Cholesterol: 100 mg/dL — ABNORMAL LOW (ref 125–200)
HDL: 21 mg/dL — ABNORMAL LOW (ref >=46)
LDL CALC: 29 mg/dL (ref <130)
TRIGLYCERIDES: 252 mg/dL — AB (ref <150)
VLDL: 50 mg/dL — AB (ref <30)

## 2015-07-21 LAB — HEPATITIS C ANTIBODY: HCV AB: NEGATIVE

## 2015-07-21 LAB — HEPATIC FUNCTION PANEL
ALT: 31 U/L — ABNORMAL HIGH (ref 6–29)
AST: 31 U/L (ref 10–35)
Albumin: 4 g/dL (ref 3.6–5.1)
Alkaline Phosphatase: 83 U/L (ref 33–130)
BILIRUBIN DIRECT: 0.1 mg/dL (ref <=0.2)
BILIRUBIN INDIRECT: 0.3 mg/dL (ref 0.2–1.2)
TOTAL PROTEIN: 6.8 g/dL (ref 6.1–8.1)
Total Bilirubin: 0.4 mg/dL (ref 0.2–1.2)

## 2015-07-21 LAB — VITAMIN D 25 HYDROXY (VIT D DEFICIENCY, FRACTURES): VIT D 25 HYDROXY: 23 ng/mL — AB (ref 30–100)

## 2015-07-22 DIAGNOSIS — Z0000 Encounter for general adult medical examination without abnormal findings: Secondary | ICD-10-CM | POA: Insufficient documentation

## 2015-07-22 NOTE — Assessment & Plan Note (Signed)

## 2015-07-26 ENCOUNTER — Other Ambulatory Visit: Payer: Self-pay | Admitting: Endocrinology

## 2015-07-27 ENCOUNTER — Other Ambulatory Visit: Payer: Self-pay

## 2015-07-27 ENCOUNTER — Other Ambulatory Visit: Payer: Self-pay | Admitting: Family Medicine

## 2015-07-27 MED ORDER — ERGOCALCIFEROL 1.25 MG (50000 UT) PO CAPS: 50000.0000 [IU] | ORAL_CAPSULE | ORAL | Status: DC

## 2015-08-04 ENCOUNTER — Other Ambulatory Visit: Payer: Self-pay

## 2015-08-04 MED ORDER — EZETIMIBE 10 MG PO TABS: 10.0000 mg | ORAL_TABLET | Freq: Every day | ORAL | Status: DC

## 2015-08-05 ENCOUNTER — Other Ambulatory Visit: Payer: Self-pay | Admitting: Family Medicine

## 2015-08-15 ENCOUNTER — Ambulatory Visit (INDEPENDENT_AMBULATORY_CARE_PROVIDER_SITE_OTHER): Payer: PPO | Admitting: Endocrinology

## 2015-08-15 ENCOUNTER — Encounter: Payer: Self-pay | Admitting: Endocrinology

## 2015-08-15 VITALS — BP 122/58 | HR 67 | Wt 207.0 lb

## 2015-08-15 DIAGNOSIS — Z794 Long term (current) use of insulin: Secondary | ICD-10-CM

## 2015-08-15 DIAGNOSIS — IMO0001 Reserved for inherently not codable concepts without codable children: Secondary | ICD-10-CM

## 2015-08-15 DIAGNOSIS — E119 Type 2 diabetes mellitus without complications: Secondary | ICD-10-CM | POA: Diagnosis not present

## 2015-08-15 LAB — POCT GLYCOSYLATED HEMOGLOBIN (HGB A1C): Hemoglobin A1C: 7.7

## 2015-08-15 NOTE — Progress Notes (Signed)
Subjective:    Patient ID: Krista Strickland, female    DOB: [birth year], 65 y.o.   MRN: [id number]  HPI DM type: insulin-requiring type 2 Dx'ed: [id number] Complications: polyneuropathy, renal insufficiency and CVA. Therapy: insulin since 2010 GDM: never DKA: never Severe hypoglycemia: never.   Pancreatitis: never.   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 she cannot afford analogs, so we had to try 70/30 (the NPH caused no reaction this time).  She takes 70/30 QAM and reg QPM, due to the pattern of cbg's.   Interval history:  pt states she feels well in general.  She says she never misses the insulin.  Meter is downloaded today, and the printout is scanned into the record.  cbg varies from 100-300.  It is in general higher as the day goes on, but not necessarily so.  Past Medical History  Diagnosis Date  . Hyperlipidemia   . Obesity   . Diabetes mellitus, type 2 ([place])   . Hypertension   . History of MRSA infection 03/2009    on ant abdomen   . Chronic kidney disease   . Gout   . Psoriasis   . Stroke ([place])   . Coronary artery disease   . GERD (gastroesophageal reflux disease)     no medication in 2017  . Hypercalcemia 2017    managed by nephrology    Past Surgical History  Procedure Laterality Date  . Partial hysterectomy    . Rt,. neck biopsy    . Excison of rt breast cyst    . Appendectomy  2012  . Laparoscopic salpingo oopherectomy Right 04/22/2012    Procedure: LAPAROSCOPIC SALPINGO OOPHORECTOMY;  Surgeon: Jonnie Kind, MD;  Location: [facility];  Service: Gynecology;  Laterality: Right;  end 11:17  . Mass excision N/A 04/22/2012    Procedure: EXCISION SKIN TAGS NECK AND HEAD;  Surgeon: Jonnie Kind, MD;  Location: [facility];  Service: Gynecology;  Laterality: N/A;  start 11:19    Social History   Social History  . Marital Status: Married    Spouse Name: N/A  . Number of Children:  1  . Years of Education: N/A   Occupational History  . disabled     Social History Main Topics  . Smoking status: Former Smoker    Quit date: 07/16/1978  . Smokeless tobacco: Not on file  . Alcohol Use: No  . Drug Use: No  . Sexual Activity: Yes   Other Topics Concern  . Not on file   Social History Narrative    Current Outpatient Prescriptions on File Prior to Visit  Medication Sig Dispense Refill  . allopurinol (ZYLOPRIM) 300 MG tablet TAKE ONE TABLET BY MOUTH ONCE DAILY 30 tablet 5  . aspirin 325 MG tablet Take 325 mg by mouth daily.    . cyclobenzaprine (FLEXERIL) 10 MG tablet Take 10 mg by mouth 3 (three) times daily as needed. Muscle Spasms    . diltiazem (DILACOR XR) 180 MG 24 hr capsule TAKE ONE CAPSULE BY MOUTH ONCE DAILY 30 capsule 3  . docusate sodium (COLACE) 100 MG capsule Take 100 mg by mouth daily as needed. Constipation    . ergocalciferol (VITAMIN D2) 50000 units capsule Take 1 capsule (50,000 Units total) by mouth once a week. One capsule once weekly 4 capsule 11  . ezetimibe (ZETIA) 10 MG tablet TAKE ONE TABLET BY MOUTH ONCE DAILY 30 tablet  2  . glucose blood (ONE TOUCH ULTRA TEST) test strip Use to check blood sugar 2 times per day. DX CODE: e11.9 100 each 5  . HYDROcodone-acetaminophen (NORCO) 7.5-325 MG per tablet Take 1 tablet by mouth every 4 (four) hours as needed. Pain    . insulin NPH-regular Human (NOVOLIN 70/30) (70-30) 100 UNIT/ML injection Inject 180 Units into the skin daily with breakfast. 90 mL 11  . insulin regular (NOVOLIN R RELION) 100 units/mL injection Inject 0.3 mLs (30 Units total) into the skin daily with supper. 10 mL 11  . metolazone (ZAROXOLYN) 5 MG tablet TAKE ONE TABLET BY MOUTH ONCE DAILY 30 tablet 3  . metoprolol (LOPRESSOR) 100 MG tablet TAKE ONE TABLET BY MOUTH TWICE DAILY 60 tablet 3  . midodrine (PROAMATINE) 5 MG tablet Take 5 mg by mouth daily.    Glory Rosebush DELICA LANCETS 99991111 MISC 1 Device by Does not apply route 2 (two) times  daily. 60 each 11  . potassium chloride (K-DUR) 10 MEQ tablet TAKE THREE TABLETS BY MOUTH THREE TIMES DAILY 270 tablet 0  . pravastatin (PRAVACHOL) 20 MG tablet TAKE ONE TABLET BY MOUTH ONCE DAILY WITH  BREAKFAST 30 tablet 3  . RELION INSULIN SYR 1CC/30G 30G X 5/16" 1 ML MISC USE ONE SYRINGE TO INJECT INSULIN SUBCUTANEOUSLY THREE TIMES DAILY AS DIRECTED 150 each 0  . spironolactone (ALDACTONE) 25 MG tablet TAKE ONE TABLET BY MOUTH ONCE DAILY 30 tablet 3  . torsemide (DEMADEX) 20 MG tablet Take 20 mg by mouth. TAKE 2 TABLETS EVERY OTHER DAY AND THEN 4 TABLETS EVERY OTHER DAY     No current facility-administered medications on file prior to visit.    Allergies  Allergen Reactions  . Benazepril Swelling  . Fish Allergy   . Metronidazole Hives  . Mobic [Meloxicam] Hives  . Penicillins Swelling  . Sulfonamide Derivatives Hives    Family History  Problem Relation Age of Onset  . Hypertension Brother   . Gout Brother   . Hypertension Brother   . Diabetes Neg Hx   . Hypertension Sister   . Gout Sister   . Cancer Sister 28    pancreatic   . Gout Sister     BP 122/58 mmHg  Pulse 67  Wt 207 lb (93.895 kg)  SpO2 97%  Review of Systems She denies hypoglycemia.      Objective:   Physical Exam VITAL SIGNS:  See vs page GENERAL: no distress.  Pulses: dorsalis pedis intact bilat.  MSK: no deformity of the feet CV: 2+ bilat leg edema Skin: no ulcer on the feet. normal color and temp on the feet. Neuro: sensation is intact to touch on the feet.  Ext: There is bilateral onychomycosis of the toenails.    A1c=7.7%    Assessment & Plan:  Insulin-requiring type 2: this is the best control this pt should aim for, given this regimen, which does match insulin to her changing needs throughout the day.  Patient is advised the following: Patient Instructions  check your blood sugar twice a day.  vary the time of day when you check, between before the 3 meals, and at bedtime.   also check if you have symptoms of your blood sugar being too high or too low.  please keep a record of the readings and bring it to your next appointment here.  You can write it on any piece of paper.  please call us sooner if your blood sugar goes below 70, or  if you have a lot of readings over 200.   Please continue the same insulins.   On this type of insulin schedule, you should eat meals on a regular schedule.  If a meal is missed or significantly delayed, your blood sugar could go low.  Please come back for a follow-up appointment in 3-4 months.      Renato Shin, MD

## 2015-08-15 NOTE — Patient Instructions (Addendum)
check your blood sugar twice a day.  vary the time of day when you check, between before the 3 meals, and at bedtime.  also check if you have symptoms of your blood sugar being too high or too low.  please keep a record of the readings and bring it to your next appointment here.  You can write it on any piece of paper.  please call us sooner if your blood sugar goes below 70, or if you have a lot of readings over 200.   Please continue the same insulins.   On this type of insulin schedule, you should eat meals on a regular schedule.  If a meal is missed or significantly delayed, your blood sugar could go low.  Please come back for a follow-up appointment in 3-4 months.

## 2015-08-17 DIAGNOSIS — M5416 Radiculopathy, lumbar region: Secondary | ICD-10-CM | POA: Diagnosis not present

## 2015-08-17 DIAGNOSIS — M4806 Spinal stenosis, lumbar region: Secondary | ICD-10-CM | POA: Diagnosis not present

## 2015-08-17 DIAGNOSIS — Z6832 Body mass index (BMI) 32.0-32.9, adult: Secondary | ICD-10-CM | POA: Diagnosis not present

## 2015-08-18 ENCOUNTER — Other Ambulatory Visit: Payer: Self-pay | Admitting: Family Medicine

## 2015-08-31 DIAGNOSIS — H20012 Primary iridocyclitis, left eye: Secondary | ICD-10-CM | POA: Diagnosis not present

## 2015-09-02 DIAGNOSIS — I10 Essential (primary) hypertension: Secondary | ICD-10-CM | POA: Diagnosis not present

## 2015-09-02 DIAGNOSIS — Z79899 Other long term (current) drug therapy: Secondary | ICD-10-CM | POA: Diagnosis not present

## 2015-09-02 DIAGNOSIS — E559 Vitamin D deficiency, unspecified: Secondary | ICD-10-CM | POA: Diagnosis not present

## 2015-09-02 DIAGNOSIS — D509 Iron deficiency anemia, unspecified: Secondary | ICD-10-CM | POA: Diagnosis not present

## 2015-09-02 DIAGNOSIS — N183 Chronic kidney disease, stage 3 (moderate): Secondary | ICD-10-CM | POA: Diagnosis not present

## 2015-09-02 DIAGNOSIS — D649 Anemia, unspecified: Secondary | ICD-10-CM | POA: Diagnosis not present

## 2015-09-05 DIAGNOSIS — H20012 Primary iridocyclitis, left eye: Secondary | ICD-10-CM | POA: Diagnosis not present

## 2015-09-06 DIAGNOSIS — E1129 Type 2 diabetes mellitus with other diabetic kidney complication: Secondary | ICD-10-CM | POA: Diagnosis not present

## 2015-09-06 DIAGNOSIS — I509 Heart failure, unspecified: Secondary | ICD-10-CM | POA: Diagnosis not present

## 2015-09-06 DIAGNOSIS — R809 Proteinuria, unspecified: Secondary | ICD-10-CM | POA: Diagnosis not present

## 2015-09-06 DIAGNOSIS — N183 Chronic kidney disease, stage 3 (moderate): Secondary | ICD-10-CM | POA: Diagnosis not present

## 2015-09-06 DIAGNOSIS — N250 Renal osteodystrophy: Secondary | ICD-10-CM | POA: Diagnosis not present

## 2015-09-19 ENCOUNTER — Other Ambulatory Visit: Payer: Self-pay | Admitting: Family Medicine

## 2015-09-21 DIAGNOSIS — Z6833 Body mass index (BMI) 33.0-33.9, adult: Secondary | ICD-10-CM | POA: Diagnosis not present

## 2015-09-21 DIAGNOSIS — M4310 Spondylolisthesis, site unspecified: Secondary | ICD-10-CM | POA: Insufficient documentation

## 2015-09-27 ENCOUNTER — Other Ambulatory Visit: Payer: Self-pay | Admitting: Endocrinology

## 2015-09-27 ENCOUNTER — Telehealth: Payer: Self-pay | Admitting: Orthopaedic Surgery

## 2015-10-05 DIAGNOSIS — E119 Type 2 diabetes mellitus without complications: Secondary | ICD-10-CM | POA: Diagnosis not present

## 2015-10-05 DIAGNOSIS — H2513 Age-related nuclear cataract, bilateral: Secondary | ICD-10-CM | POA: Diagnosis not present

## 2015-10-05 LAB — HM DIABETES EYE EXAM

## 2015-10-21 ENCOUNTER — Other Ambulatory Visit: Payer: Self-pay

## 2015-10-21 MED ORDER — "INSULIN SYRINGE-NEEDLE U-100 31G X 5/16"" 1 ML MISC": 0 refills | Status: DC

## 2015-11-09 ENCOUNTER — Encounter: Payer: Self-pay | Admitting: Family Medicine

## 2015-11-09 ENCOUNTER — Ambulatory Visit (INDEPENDENT_AMBULATORY_CARE_PROVIDER_SITE_OTHER): Payer: PPO | Admitting: Family Medicine

## 2015-11-09 VITALS — BP 148/70 | HR 79 | Ht 63.0 in | Wt 205.0 lb

## 2015-11-09 DIAGNOSIS — E119 Type 2 diabetes mellitus without complications: Secondary | ICD-10-CM

## 2015-11-09 DIAGNOSIS — E0841 Diabetes mellitus due to underlying condition with diabetic mononeuropathy: Secondary | ICD-10-CM

## 2015-11-09 DIAGNOSIS — IMO0001 Reserved for inherently not codable concepts without codable children: Secondary | ICD-10-CM

## 2015-11-09 DIAGNOSIS — E790 Hyperuricemia without signs of inflammatory arthritis and tophaceous disease: Secondary | ICD-10-CM

## 2015-11-09 DIAGNOSIS — Z23 Encounter for immunization: Secondary | ICD-10-CM

## 2015-11-09 DIAGNOSIS — E559 Vitamin D deficiency, unspecified: Secondary | ICD-10-CM

## 2015-11-09 DIAGNOSIS — E781 Pure hyperglyceridemia: Secondary | ICD-10-CM

## 2015-11-09 DIAGNOSIS — E785 Hyperlipidemia, unspecified: Secondary | ICD-10-CM | POA: Diagnosis not present

## 2015-11-09 DIAGNOSIS — E1140 Type 2 diabetes mellitus with diabetic neuropathy, unspecified: Secondary | ICD-10-CM | POA: Insufficient documentation

## 2015-11-09 DIAGNOSIS — I10 Essential (primary) hypertension: Secondary | ICD-10-CM

## 2015-11-09 DIAGNOSIS — Z794 Long term (current) use of insulin: Secondary | ICD-10-CM

## 2015-11-09 MED ORDER — GABAPENTIN 100 MG PO CAPS: 100.0000 mg | ORAL_CAPSULE | Freq: Every day | ORAL | 5 refills | Status: DC

## 2015-11-09 NOTE — Patient Instructions (Addendum)
Annual physical exam in 7 to 8 weeks, call if you need me sooner  Flu vaccine today  New is gabapentin for pain, to take at night   Congrats on improved blood sugar, keep it up  Blood pressure elevated today, work on plant based diet and also smaller portions so you continue to lose weight and improve overall health , including blood sugar, limit sodium   It is important that you exercise regularly at least 30 minutes 5 times a week. If you develop chest pain, have severe difficulty breathing, or feel very tired, stop exercising immediately and seek medical attention    Fasting lipid, cmp and EGFr, TSH, vit D, uric acid in this next 1 week

## 2015-11-13 ENCOUNTER — Encounter: Payer: Self-pay | Admitting: Family Medicine

## 2015-11-13 NOTE — Assessment & Plan Note (Signed)
Hyperlipidemia:Low fat diet discussed and encouraged.   Lipid Panel  Lab Results  Component Value Date   CHOL 100 (L) 07/20/2015   HDL 21 (L) 07/20/2015   LDLCALC 29 07/20/2015   TRIG 252 (H) 07/20/2015   CHOLHDL 4.8 07/20/2015     Updated lab needed at/ before next visit.

## 2015-11-13 NOTE — Assessment & Plan Note (Signed)
Updated lab needed at/ before next visit. No recent gout flares

## 2015-11-13 NOTE — Assessment & Plan Note (Signed)
Sub optimal control despite med compliance and multiple meds. No change in meds DASH diet and commitment to daily physical activity for a minimum of 30 minutes discussed and encouraged, as a part of hypertension management. The importance of attaining a healthy weight is also discussed.  BP/Weight 11/09/2015 08/15/2015 07/20/2015 06/17/2015 06/13/2015 04/11/2015 84/41/7127  Systolic BP 871 836 725 500 164 290 379  Diastolic BP 70 58 70 50 84 76 88  Wt. (Lbs) 205 207 209 - 210 221.8 228  BMI 36.31 36.68 37.03 - 37.21 39.3 40.4

## 2015-11-13 NOTE — Assessment & Plan Note (Signed)
Improved and treated by endo Ms. Hubers is reminded of the importance of commitment to daily physical activity for 30 minutes or more, as able and the need to limit carbohydrate intake to 30 to 60 grams per meal to help with blood sugar control.   The need to take medication as prescribed, test blood sugar as directed, and to call between visits if there is a concern that blood sugar is uncontrolled is also discussed.   Ms. Guerry is reminded of the importance of daily foot exam, annual eye examination, and good blood sugar, blood pressure and cholesterol control. Improved , treated by endo  Diabetic Labs Latest Ref Rng & Units 08/15/2015 07/20/2015 06/13/2015 04/11/2015 01/10/2015  HbA1c - 7.7 - 8.5 8.2 7.7  Microalbumin Not estab mg/dL - - - - -  Micro/Creat Ratio <30 mcg/mg creat - - - - -  Chol 125 - 200 mg/dL - 100(L) - - -  HDL >=46 mg/dL - 21(L) - - -  Calc LDL <130 mg/dL - 29 - - -  Triglycerides <150 mg/dL - 252(H) - - -  Creatinine 0.50 - 0.99 mg/dL - - - - -   BP/Weight 11/09/2015 08/15/2015 07/20/2015 06/17/2015 06/13/2015 04/11/2015 45/99/7741  Systolic BP 423 953 202 334 356 861 683  Diastolic BP 70 58 70 50 84 76 88  Wt. (Lbs) 205 207 209 - 210 221.8 228  BMI 36.31 36.68 37.03 - 37.21 39.3 40.4   Foot/eye exam completion dates Latest Ref Rng & Units 10/05/2015 08/15/2015  Eye Exam No Retinopathy No Retinopathy -  Foot exam Order - - -  Foot Form Completion - - Done

## 2015-11-13 NOTE — Assessment & Plan Note (Signed)
Improved Patient re-educated about  the importance of commitment to a  minimum of 150 minutes of exercise per week.  The importance of healthy food choices with portion control discussed. Encouraged to start a food diary, count calories and to consider  joining a support group. Sample diet sheets offered. Goals set by the patient for the next several months.   Weight /BMI 11/09/2015 08/15/2015 07/20/2015  WEIGHT 205 lb 207 lb 209 lb  HEIGHT 5\' 3"  - 5\' 3"   BMI 36.31 kg/m2 36.68 kg/m2 37.03 kg/m2

## 2015-11-13 NOTE — Assessment & Plan Note (Signed)
uncontrolled pain at night, resume gabapentin low dose

## 2015-11-13 NOTE — Progress Notes (Signed)
Krista Strickland     MRN: [id number]      DOB: 26-Aug-1950   HPI Krista Strickland is here for follow up and re-evaluation of chronic medical conditions, medication management and review of any available recent lab and radiology data.  Preventive health is updated, specifically  Cancer screening and Immunization.   Questions or concerns regarding consultations or procedures which the PT has had in the interim are  addressed. The PT denies any adverse reactions to current medications since the last visit.  Denies polyuria, polydipsia, blurred vision , or hypoglycemic episodes.Reports improved blood sugars and feels generally better, able to afford medication also C/o increased and uncontrolled lower extremity pain and numbness, wants medication esp at night  ROS Denies recent fever or chills. Denies sinus pressure, nasal congestion, ear pain or sore throat. Denies chest congestion, productive cough or wheezing. Denies chest pains, palpitations and leg swelling Denies abdominal pain, nausea, vomiting,diarrhea or constipation.   Denies dysuria, frequency, hesitancy or incontinence. Denies joint pain, swelling and limitation in mobility. Denies headaches, seizures,  Denies depression, anxiety or insomnia. Denies skin break down or rash.   PE  BP (!) 148/70   Pulse 79   Ht 5\' 3"  (1.6 m)   Wt 205 lb (93 kg)   SpO2 95%   BMI 36.31 kg/m   Patient alert and oriented and in no cardiopulmonary distress.  HEENT: No facial asymmetry, EOMI,   oropharynx pink and moist.  Neck supple no JVD, no mass.  Chest: Clear to auscultation bilaterally.  CVS: S1, S2 no murmurs, no S3.Regular rate.  ABD: Soft non tender.   Ext: No edema  MS: Adequate ROM spine, shoulders, hips and knees.  Skin: Intact, no ulcerations or rash noted.  Psych: Good eye contact, normal affect. Memory intact not anxious or depressed appearing.  CNS: CN 2-12 intact, power,  normal throughout.no focal deficits  noted.   Assessment & Plan  Essential hypertension Sub optimal control despite med compliance and multiple meds. No change in meds DASH diet and commitment to daily physical activity for a minimum of 30 minutes discussed and encouraged, as a part of hypertension management. The importance of attaining a healthy weight is also discussed.  BP/Weight 11/09/2015 08/15/2015 07/20/2015 06/17/2015 06/13/2015 04/11/2015 67/54/4920  Systolic BP 100 712 197 588 325 498 264  Diastolic BP 70 58 70 50 84 76 88  Wt. (Lbs) 205 207 209 - 210 221.8 228  BMI 36.31 36.68 37.03 - 37.21 39.3 40.4       Diabetes mellitus, insulin dependent (IDDM), controlled ([place]) Improved and treated by endo Krista Strickland is reminded of the importance of commitment to daily physical activity for 30 minutes or more, as able and the need to limit carbohydrate intake to 30 to 60 grams per meal to help with blood sugar control.   The need to take medication as prescribed, test blood sugar as directed, and to call between visits if there is a concern that blood sugar is uncontrolled is also discussed.   Krista Strickland is reminded of the importance of daily foot exam, annual eye examination, and good blood sugar, blood pressure and cholesterol control. Improved , treated by endo  Diabetic Labs Latest Ref Rng & Units 08/15/2015 07/20/2015 06/13/2015 04/11/2015 01/10/2015  HbA1c - 7.7 - 8.5 8.2 7.7  Microalbumin Not estab mg/dL - - - - -  Micro/Creat Ratio <30 mcg/mg creat - - - - -  Chol 125 - 200 mg/dL - 100(L) - - -  HDL >=46 mg/dL - 21(L) - - -  Calc LDL <130 mg/dL - 29 - - -  Triglycerides <150 mg/dL - 252(H) - - -  Creatinine 0.50 - 0.99 mg/dL - - - - -   BP/Weight 11/09/2015 08/15/2015 07/20/2015 06/17/2015 06/13/2015 04/11/2015 19/37/9024  Systolic BP 097 353 299 242 683 419 622  Diastolic BP 70 58 70 50 84 76 88  Wt. (Lbs) 205 207 209 - 210 221.8 228  BMI 36.31 36.68 37.03 - 37.21 39.3 40.4   Foot/eye exam completion dates  Latest Ref Rng & Units 10/05/2015 08/15/2015  Eye Exam No Retinopathy No Retinopathy -  Foot exam Order - - -  Foot Form Completion - - Done        Diabetic neuropathy (HCC) uncontrolled pain at night, resume gabapentin low dose  Hypertriglyceridemia Hyperlipidemia:Low fat diet discussed and encouraged.   Lipid Panel  Lab Results  Component Value Date   CHOL 100 (L) 07/20/2015   HDL 21 (L) 07/20/2015   LDLCALC 29 07/20/2015   TRIG 252 (H) 07/20/2015   CHOLHDL 4.8 07/20/2015     Updated lab needed at/ before next visit.   Morbid obesity Improved Patient re-educated about  the importance of commitment to a  minimum of 150 minutes of exercise per week.  The importance of healthy food choices with portion control discussed. Encouraged to start a food diary, count calories and to consider  joining a support group. Sample diet sheets offered. Goals set by the patient for the next several months.   Weight /BMI 11/09/2015 08/15/2015 07/20/2015  WEIGHT 205 lb 207 lb 209 lb  HEIGHT 5\' 3"  - 5\' 3"   BMI 36.31 kg/m2 36.68 kg/m2 37.03 kg/m2      Hyperuricemia Updated lab needed at/ before next visit. No recent gout flares

## 2015-11-16 DIAGNOSIS — M5416 Radiculopathy, lumbar region: Secondary | ICD-10-CM | POA: Diagnosis not present

## 2015-11-16 DIAGNOSIS — M48062 Spinal stenosis, lumbar region with neurogenic claudication: Secondary | ICD-10-CM | POA: Diagnosis not present

## 2015-11-16 DIAGNOSIS — M4310 Spondylolisthesis, site unspecified: Secondary | ICD-10-CM | POA: Diagnosis not present

## 2015-11-16 DIAGNOSIS — Z6833 Body mass index (BMI) 33.0-33.9, adult: Secondary | ICD-10-CM | POA: Diagnosis not present

## 2015-11-23 ENCOUNTER — Other Ambulatory Visit: Payer: Self-pay

## 2015-11-23 MED ORDER — "INSULIN SYRINGE-NEEDLE U-100 31G X 5/16"" 1 ML MISC": 0 refills | Status: DC

## 2015-11-25 DIAGNOSIS — I10 Essential (primary) hypertension: Secondary | ICD-10-CM | POA: Diagnosis not present

## 2015-11-25 DIAGNOSIS — D509 Iron deficiency anemia, unspecified: Secondary | ICD-10-CM | POA: Diagnosis not present

## 2015-11-25 DIAGNOSIS — D649 Anemia, unspecified: Secondary | ICD-10-CM | POA: Diagnosis not present

## 2015-11-25 DIAGNOSIS — R809 Proteinuria, unspecified: Secondary | ICD-10-CM | POA: Diagnosis not present

## 2015-11-25 DIAGNOSIS — Z79899 Other long term (current) drug therapy: Secondary | ICD-10-CM | POA: Diagnosis not present

## 2015-11-25 DIAGNOSIS — N183 Chronic kidney disease, stage 3 (moderate): Secondary | ICD-10-CM | POA: Diagnosis not present

## 2015-11-29 DIAGNOSIS — E1129 Type 2 diabetes mellitus with other diabetic kidney complication: Secondary | ICD-10-CM | POA: Diagnosis not present

## 2015-11-29 DIAGNOSIS — D649 Anemia, unspecified: Secondary | ICD-10-CM | POA: Diagnosis not present

## 2015-11-29 DIAGNOSIS — I509 Heart failure, unspecified: Secondary | ICD-10-CM | POA: Diagnosis not present

## 2015-11-29 DIAGNOSIS — N183 Chronic kidney disease, stage 3 (moderate): Secondary | ICD-10-CM | POA: Diagnosis not present

## 2015-11-29 DIAGNOSIS — R809 Proteinuria, unspecified: Secondary | ICD-10-CM | POA: Diagnosis not present

## 2015-12-11 NOTE — Progress Notes (Signed)
Subjective:    Patient ID: Krista Strickland, female    DOB: 1950/09/19, 65 y.o.   MRN: [id number]  HPI DM type: insulin-requiring type 2 Dx'ed: 7619 Complications: polyneuropathy, renal insufficiency and CVA.  Therapy: insulin since 2010 GDM: never DKA: never Severe hypoglycemia: never.   Pancreatitis: never.   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 she cannot afford analogs, so we had to try 70/30 (the NPH caused no reaction this time).  She takes 70/30 QAM and reg QPM, due to the pattern of cbg's.   Interval history:  pt states she feels well in general.  She says she never misses the insulin.  Meter is downloaded today, and the printout is scanned into the record.  cbg varies from 70-340.  It is lowest fasting and in the afternoon, and highest at hs  Past Medical History:  Diagnosis Date  . Chronic kidney disease   . Coronary artery disease   . Diabetes mellitus, type 2 ([place])   . GERD (gastroesophageal reflux disease)    no medication in 2017  . Gout   . History of MRSA infection 03/2009   on ant abdomen   . Hypercalcemia 2017   managed by nephrology  . Hyperlipidemia   . Hypertension   . Obesity   . Psoriasis   . Stroke [facility])     Past Surgical History:  Procedure Laterality Date  . APPENDECTOMY  2012  . excison of rt breast cyst    . LAPAROSCOPIC SALPINGO OOPHERECTOMY Right 04/22/2012   Procedure: LAPAROSCOPIC SALPINGO OOPHORECTOMY;  Surgeon: Jonnie Kind, MD;  Location: [facility];  Service: Gynecology;  Laterality: Right;  end 11:17  . MASS EXCISION N/A 04/22/2012   Procedure: EXCISION SKIN TAGS NECK AND HEAD;  Surgeon: Jonnie Kind, MD;  Location: [facility];  Service: Gynecology;  Laterality: N/A;  start 11:19  . PARTIAL HYSTERECTOMY    . rt,. neck biopsy      Social History   Social History  . Marital status: Married    Spouse name: N/A  . Number of children: 1  .  Years of education: N/A   Occupational History  . disabled  Unemployed   Social History Main Topics  . Smoking status: Former Smoker    Quit date: 07/16/1978  . Smokeless tobacco: Not on file  . Alcohol use No  . Drug use: No  . Sexual activity: Yes   Other Topics Concern  . Not on file   Social History Narrative  . No narrative on file    Current Outpatient Prescriptions on File Prior to Visit  Medication Sig Dispense Refill  . allopurinol (ZYLOPRIM) 300 MG tablet TAKE ONE TABLET BY MOUTH ONCE DAILY 30 tablet 5  . aspirin 325 MG tablet Take 325 mg by mouth daily.    . cyclobenzaprine (FLEXERIL) 10 MG tablet Take 10 mg by mouth 3 (three) times daily as needed. Muscle Spasms    . diltiazem (DILACOR XR) 180 MG 24 hr capsule TAKE ONE CAPSULE BY MOUTH ONCE DAILY 30 capsule 3  . docusate sodium (COLACE) 100 MG capsule Take 100 mg by mouth daily as needed. Constipation    . ergocalciferol (VITAMIN D2) 50000 units capsule Take 1 capsule (50,000 Units total) by mouth once a week. One capsule once weekly 4 capsule 11  . ezetimibe (ZETIA) 10 MG tablet TAKE ONE TABLET BY MOUTH ONCE DAILY 30 tablet  2  . gabapentin (NEURONTIN) 100 MG capsule Take 1 capsule (100 mg total) by mouth at bedtime. 30 capsule 5  . glucose blood (ONE TOUCH ULTRA TEST) test strip Use to check blood sugar 2 times per day. DX CODE: e11.9 100 each 5  . HYDROcodone-acetaminophen (NORCO) 7.5-325 MG per tablet Take 1 tablet by mouth every 4 (four) hours as needed. Pain    . Insulin Syringe-Needle U-100 (RELION INSULIN SYRINGE 1ML/31G) 31G X 5/16" 1 ML MISC USE ONE SYRINGE TO INJECT INSULIN SUBCUTANEOUSLY THREE TIMES DAILY AS DIRECTED 150 each 0  . metolazone (ZAROXOLYN) 5 MG tablet TAKE ONE TABLET BY MOUTH ONCE DAILY 30 tablet 3  . metoprolol (LOPRESSOR) 100 MG tablet TAKE ONE TABLET BY MOUTH TWICE DAILY 60 tablet 3  . midodrine (PROAMATINE) 5 MG tablet Take 5 mg by mouth daily.    Glory Rosebush DELICA LANCETS 53G MISC 1 Device  by Does not apply route 2 (two) times daily. 60 each 11  . potassium chloride (K-DUR) 10 MEQ tablet TAKE THREE TABLETS BY MOUTH THREE TIMES DAILY 270 tablet 3  . pravastatin (PRAVACHOL) 20 MG tablet TAKE ONE TABLET BY MOUTH ONCE DAILY WITH BREAKFAST 30 tablet 3  . RELION INSULIN SYR 1CC/30G 30G X 5/16" 1 ML MISC USE ONE SYRINGE TO INJECT INSULIN SUBCUTANEOUSLY THREE TIMES DAILY AS DIRECTED 150 each 0  . spironolactone (ALDACTONE) 25 MG tablet TAKE ONE TABLET BY MOUTH ONCE DAILY 30 tablet 3  . torsemide (DEMADEX) 20 MG tablet Take 20 mg by mouth. TAKE 2 TABLETS EVERY OTHER DAY AND THEN 4 TABLETS EVERY OTHER DAY     No current facility-administered medications on file prior to visit.     Allergies  Allergen Reactions  . Benazepril Swelling  . Fish Allergy   . Metronidazole Hives  . Mobic [Meloxicam] Hives  . Penicillins Swelling  . Sulfonamide Derivatives Hives    Family History  Problem Relation Age of Onset  . Hypertension Brother   . Gout Brother   . Hypertension Brother   . Diabetes Neg Hx   . Hypertension Sister   . Gout Sister   . Cancer Sister 54    pancreatic   . Gout Sister     BP 136/70   Pulse 66   Ht 5\' 3"  (1.6 m)   Wt 201 lb (91.2 kg)   SpO2 97%   BMI 35.61 kg/m    Review of Systems She has lost a few lbs, due to her efforts.      Objective:   Physical Exam VITAL SIGNS:  See vs page GENERAL: no distress.  NECK: the thyroid is slightly enlarged, with an irreg surface, but no palpable nodule Pulses: dorsalis pedis intact bilat.  MSK: no deformity of the feet CV: 1+ bilat leg edema Skin: no ulcer on the feet. normal color and temp on the feet. Neuro: sensation is intact to touch on the feet.  Ext: There is bilateral onychomycosis of the toenails.     A1c=7.1%  Lab Results  Component Value Date   TSH 1.41 12/12/2015      Assessment & Plan:  Insulin-requiring type 2 DM: Based on the pattern of her cbg's, she needs some adjustment in her  therapy Multinodular goiter, new to me: euthyroid.  due for recheck.   Patient is advised the following: Patient Instructions  check your blood sugar twice a day.  vary the time of day when you check, between before the 3 meals, and at bedtime.  also check if you have symptoms of your blood sugar being too high or too low.  please keep a record of the readings and bring it to your next appointment here.  You can write it on any piece of paper.  please call us sooner if your blood sugar goes below 70, or if you have a lot of readings over 200.   Please change the insulins to the numbers listed below. On this type of insulin schedule, you should eat meals on a regular schedule.  If a meal is missed or significantly delayed, your blood sugar could go low.  Let's recheck the ultrasound.  you will receive a phone call, about a day and time for an appointment. blood tests are requested for you today.  We'll let you know about the results.  Please come back for a follow-up appointment in 4 months.

## 2015-12-12 ENCOUNTER — Encounter: Payer: Self-pay | Admitting: Endocrinology

## 2015-12-12 ENCOUNTER — Ambulatory Visit (INDEPENDENT_AMBULATORY_CARE_PROVIDER_SITE_OTHER): Payer: PPO | Admitting: Endocrinology

## 2015-12-12 VITALS — BP 136/70 | HR 66 | Ht 63.0 in | Wt 201.0 lb

## 2015-12-12 DIAGNOSIS — E119 Type 2 diabetes mellitus without complications: Secondary | ICD-10-CM

## 2015-12-12 DIAGNOSIS — E042 Nontoxic multinodular goiter: Secondary | ICD-10-CM

## 2015-12-12 DIAGNOSIS — Z794 Long term (current) use of insulin: Secondary | ICD-10-CM

## 2015-12-12 DIAGNOSIS — IMO0001 Reserved for inherently not codable concepts without codable children: Secondary | ICD-10-CM

## 2015-12-12 LAB — POCT GLYCOSYLATED HEMOGLOBIN (HGB A1C): HEMOGLOBIN A1C: 7.1

## 2015-12-12 LAB — TSH: TSH: 1.41 u[IU]/mL (ref 0.35–4.50)

## 2015-12-12 MED ORDER — INSULIN NPH ISOPHANE & REGULAR (70-30) 100 UNIT/ML ~~LOC~~ SUSP: 170.0000 [IU] | Freq: Every day | SUBCUTANEOUS | 11 refills | Status: DC

## 2015-12-12 MED ORDER — INSULIN REGULAR HUMAN 100 UNIT/ML IJ SOLN: 40.0000 [IU] | Freq: Every day | INTRAMUSCULAR | 11 refills | Status: DC

## 2015-12-12 NOTE — Patient Instructions (Addendum)
check your blood sugar twice a day.  vary the time of day when you check, between before the 3 meals, and at bedtime.  also check if you have symptoms of your blood sugar being too high or too low.  please keep a record of the readings and bring it to your next appointment here.  You can write it on any piece of paper.  please call us sooner if your blood sugar goes below 70, or if you have a lot of readings over 200.   Please change the insulins to the numbers listed below. On this type of insulin schedule, you should eat meals on a regular schedule.  If a meal is missed or significantly delayed, your blood sugar could go low.  Let's recheck the ultrasound.  you will receive a phone call, about a day and time for an appointment. blood tests are requested for you today.  We'll let you know about the results.  Please come back for a follow-up appointment in 4 months.

## 2015-12-15 ENCOUNTER — Other Ambulatory Visit: Payer: Self-pay | Admitting: Family Medicine

## 2015-12-15 DIAGNOSIS — Z1231 Encounter for screening mammogram for malignant neoplasm of breast: Secondary | ICD-10-CM

## 2015-12-20 ENCOUNTER — Ambulatory Visit
Admission: RE | Admit: 2015-12-20 | Discharge: 2015-12-20 | Disposition: A | Discharge status: Home / Self Care | Payer: PPO | Source: Ambulatory Visit | Attending: Endocrinology | Admitting: Endocrinology

## 2015-12-20 DIAGNOSIS — E042 Nontoxic multinodular goiter: Secondary | ICD-10-CM

## 2015-12-23 ENCOUNTER — Other Ambulatory Visit: Payer: Self-pay | Admitting: Family Medicine

## 2015-12-27 DIAGNOSIS — I69898 Other sequelae of other cerebrovascular disease: Secondary | ICD-10-CM | POA: Diagnosis not present

## 2015-12-27 DIAGNOSIS — E785 Hyperlipidemia, unspecified: Secondary | ICD-10-CM | POA: Diagnosis not present

## 2015-12-27 DIAGNOSIS — I951 Orthostatic hypotension: Secondary | ICD-10-CM | POA: Diagnosis not present

## 2015-12-27 DIAGNOSIS — R2681 Unsteadiness on feet: Secondary | ICD-10-CM | POA: Diagnosis not present

## 2015-12-27 DIAGNOSIS — E1143 Type 2 diabetes mellitus with diabetic autonomic (poly)neuropathy: Secondary | ICD-10-CM | POA: Diagnosis not present

## 2016-01-11 ENCOUNTER — Ambulatory Visit (HOSPITAL_COMMUNITY)
Admission: RE | Admit: 2016-01-11 | Discharge: 2016-01-11 | Disposition: A | Discharge status: Home / Self Care | Payer: PPO | Source: Ambulatory Visit | Attending: Family Medicine | Admitting: Family Medicine

## 2016-01-11 DIAGNOSIS — Z1231 Encounter for screening mammogram for malignant neoplasm of breast: Secondary | ICD-10-CM | POA: Insufficient documentation

## 2016-01-16 ENCOUNTER — Other Ambulatory Visit: Payer: Self-pay | Admitting: Endocrinology

## 2016-01-16 ENCOUNTER — Other Ambulatory Visit: Payer: Self-pay | Admitting: Family Medicine

## 2016-01-20 DIAGNOSIS — E559 Vitamin D deficiency, unspecified: Secondary | ICD-10-CM | POA: Diagnosis not present

## 2016-01-20 DIAGNOSIS — E119 Type 2 diabetes mellitus without complications: Secondary | ICD-10-CM | POA: Diagnosis not present

## 2016-01-20 DIAGNOSIS — Z794 Long term (current) use of insulin: Secondary | ICD-10-CM | POA: Diagnosis not present

## 2016-01-20 DIAGNOSIS — E785 Hyperlipidemia, unspecified: Secondary | ICD-10-CM | POA: Diagnosis not present

## 2016-01-20 DIAGNOSIS — E790 Hyperuricemia without signs of inflammatory arthritis and tophaceous disease: Secondary | ICD-10-CM | POA: Diagnosis not present

## 2016-01-20 DIAGNOSIS — I10 Essential (primary) hypertension: Secondary | ICD-10-CM | POA: Diagnosis not present

## 2016-01-21 LAB — COMPLETE METABOLIC PANEL WITH GFR
ALT: 26 U/L (ref 6–29)
AST: 25 U/L (ref 10–35)
Albumin: 4.1 g/dL (ref 3.6–5.1)
Alkaline Phosphatase: 84 U/L (ref 33–130)
BUN: 79 mg/dL — AB (ref 7–25)
CHLORIDE: 95 mmol/L — AB (ref 98–110)
CO2: 24 mmol/L (ref 20–31)
Calcium: 10.1 mg/dL (ref 8.6–10.4)
Creat: 1.95 mg/dL — ABNORMAL HIGH (ref 0.50–0.99)
GFR, Est African American: 30 mL/min — ABNORMAL LOW (ref >=60)
GFR, Est Non African American: 26 mL/min — ABNORMAL LOW (ref >=60)
GLUCOSE: 141 mg/dL — AB (ref 65–99)
POTASSIUM: 3.6 mmol/L (ref 3.5–5.3)
SODIUM: 140 mmol/L (ref 135–146)
Total Bilirubin: 0.5 mg/dL (ref 0.2–1.2)
Total Protein: 7 g/dL (ref 6.1–8.1)

## 2016-01-21 LAB — LIPID PANEL
Cholesterol: 110 mg/dL (ref <200)
HDL: 25 mg/dL — AB (ref >50)
LDL CALC: 44 mg/dL (ref <100)
Total CHOL/HDL Ratio: 4.4 Ratio (ref <5.0)
Triglycerides: 203 mg/dL — ABNORMAL HIGH (ref <150)
VLDL: 41 mg/dL — ABNORMAL HIGH (ref <30)

## 2016-01-21 LAB — VITAMIN D 25 HYDROXY (VIT D DEFICIENCY, FRACTURES): VIT D 25 HYDROXY: 66 ng/mL (ref 30–100)

## 2016-01-21 LAB — TSH: TSH: 2.6 m[IU]/L

## 2016-01-21 LAB — URIC ACID: URIC ACID, SERUM: 6.2 mg/dL (ref 2.5–7.0)

## 2016-01-25 ENCOUNTER — Ambulatory Visit (INDEPENDENT_AMBULATORY_CARE_PROVIDER_SITE_OTHER): Payer: PPO | Admitting: Family Medicine

## 2016-01-25 ENCOUNTER — Other Ambulatory Visit (HOSPITAL_COMMUNITY)
Admission: RE | Admit: 2016-01-25 | Discharge: 2016-01-25 | Disposition: A | Discharge status: Home / Self Care | Payer: PPO | Source: Ambulatory Visit | Attending: Family Medicine | Admitting: Family Medicine

## 2016-01-25 ENCOUNTER — Encounter: Payer: Self-pay | Admitting: Family Medicine

## 2016-01-25 VITALS — BP 140/62 | HR 67 | Resp 16 | Ht 63.0 in | Wt 205.0 lb

## 2016-01-25 DIAGNOSIS — E119 Type 2 diabetes mellitus without complications: Secondary | ICD-10-CM | POA: Diagnosis not present

## 2016-01-25 DIAGNOSIS — IMO0001 Reserved for inherently not codable concepts without codable children: Secondary | ICD-10-CM

## 2016-01-25 DIAGNOSIS — Z0000 Encounter for general adult medical examination without abnormal findings: Secondary | ICD-10-CM

## 2016-01-25 DIAGNOSIS — E781 Pure hyperglyceridemia: Secondary | ICD-10-CM

## 2016-01-25 DIAGNOSIS — Z1211 Encounter for screening for malignant neoplasm of colon: Secondary | ICD-10-CM | POA: Diagnosis not present

## 2016-01-25 DIAGNOSIS — E0849 Diabetes mellitus due to underlying condition with other diabetic neurological complication: Secondary | ICD-10-CM

## 2016-01-25 DIAGNOSIS — M899 Disorder of bone, unspecified: Secondary | ICD-10-CM

## 2016-01-25 DIAGNOSIS — Z794 Long term (current) use of insulin: Secondary | ICD-10-CM

## 2016-01-25 DIAGNOSIS — M949 Disorder of cartilage, unspecified: Secondary | ICD-10-CM

## 2016-01-25 DIAGNOSIS — Z13820 Encounter for screening for osteoporosis: Secondary | ICD-10-CM

## 2016-01-25 LAB — HEMOCCULT GUIAC POC 1CARD (OFFICE): FECAL OCCULT BLD: NEGATIVE

## 2016-01-25 MED ORDER — GABAPENTIN 100 MG PO CAPS: 100.0000 mg | ORAL_CAPSULE | Freq: Every day | ORAL | 3 refills | Status: DC

## 2016-01-25 MED ORDER — GABAPENTIN 100 MG PO CAPS: 100.0000 mg | ORAL_CAPSULE | Freq: Every day | ORAL | 5 refills | Status: DC

## 2016-01-25 NOTE — Patient Instructions (Addendum)
F/u ion early may, call if you need me sooner  Foot exam and microalb today  You are referred for bone density test  Gabapentin sent for bedtime use for diabetic nerve pain  Reduce fat in diet, triglycerides still high  CBC, fasting lipid, cmp and eGFR, vit d for May appt   Please work on good  health habits so that your health will improve. 1. Commitment to daily physical activity for 30 to 60  minutes, if you are able to do this.  2. Commitment to wise food choices. Aim for half of your  food intake to be vegetable and fruit, one quarter starchy foods, and one quarter protein. Try to eat on a regular schedule  3 meals per day, snacking between meals should be limited to vegetables or fruits or small portions of nuts. 64 ounces of water per day is generally recommended, unless you have specific health conditions, like heart failure or kidney failure where you will need to limit fluid intake.  3. Commitment to sufficient and a  good quality of physical and mental rest daily, generally between 6 to 8 hours per day.  WITH PERSISTANCE AND PERSEVERANCE, THE IMPOSSIBLE , BECOMES THE NORM!   Thanks for choosing [facility], we consider it a privelige to serve you.

## 2016-01-25 NOTE — Assessment & Plan Note (Signed)

## 2016-01-25 NOTE — Assessment & Plan Note (Signed)
Unchanged, send in gabapentin

## 2016-01-26 LAB — MICROALBUMIN / CREATININE URINE RATIO
Creatinine, Urine: 30.4 mg/dL
Microalb Creat Ratio: <9.9 mg/g creat (ref 0.0–30.0)
Microalb, Ur: <3 ug/mL — ABNORMAL HIGH

## 2016-01-26 NOTE — Assessment & Plan Note (Signed)
Managed by endo and controlled 

## 2016-01-26 NOTE — Progress Notes (Signed)
    Krista Strickland     MRN: [id number]      DOB: 1950/10/26  HPI: Patient is in for annual physical exam. No other health concerns are expressed or addressed at the visit. Recent labs, if available are reviewed. Immunization is reviewed , and  updated if needed.   PE: BP 140/62   Pulse 67   Resp 16   Ht 5\' 3"  (1.6 m)   Wt 205 lb (93 kg)   SpO2 97%   BMI 36.31 kg/m   Pleasant  female, alert and oriented x 3, in no cardio-pulmonary distress. Afebrile. HEENT No facial trauma or asymetry. Sinuses non tender.  Extra occullar muscles intact,  External ears normal, tympanic membranes clear. Oropharynx moist, no exudate. Neck: supple, no adenopathy,JVD or thyromegaly.No bruits.  Chest: Clear to ascultation bilaterally.No crackles or wheezes. Non tender to palpation  Breast: No asymetry,no masses or lumps. No tenderness. No nipple discharge or inversion. No axillary or supraclavicular adenopathy  Cardiovascular system; Heart sounds normal,  S1 and  S2 ,no S3.  No murmur, or thrill. Apical beat not displaced Peripheral pulses normal.  Abdomen: Soft, non tender, no organomegaly or masses. No bruits. Bowel sounds normal. No guarding, tenderness or rebound.  Rectal:  Normal sphincter tone. No rectal mass. Guaiac negative stool.  GU: External genitalia normal female genitalia , normal female distribution of hair. No lesions. Urethral meatus normal in size, no  Prolapse, no lesions visibly  Present. Bladder non tender. Vagina pink and moist , with no visible lesions , discharge present . Adequate pelvic support no  cystocele or rectocele noted  Uterus absent, no adnexal masses, no  adnexal tenderness.   Musculoskeletal exam: Full ROM of spine, hips , shoulders and knees. No deformity ,swelling or crepitus noted. No muscle wasting or atrophy.   Neurologic: Cranial nerves 2 to 12 intact. Power, tone ,sensation and reflexes normal throughout. No disturbance in  gait. No tremor.  Skin: Intact, no ulceration, erythema , scaling or rash noted. Pigmentation normal throughout  Psych; Normal mood and affect. Judgement and concentration normal   Assessment & Plan:  Annual physical exam Annual exam as documented. Counseling done  re healthy lifestyle involving commitment to 150 minutes exercise per week, heart healthy diet, and attaining healthy weight.The importance of adequate sleep also discussed. Regular seat belt use and home safety, is also discussed. Changes in health habits are decided on by the patient with goals and time frames  set for achieving them. Immunization and cancer screening needs are specifically addressed at this visit.   Diabetic neuropathy (HCC) Unchanged, send in gabapentin

## 2016-02-02 ENCOUNTER — Ambulatory Visit (HOSPITAL_COMMUNITY)
Admission: RE | Admit: 2016-02-02 | Discharge: 2016-02-02 | Disposition: A | Discharge status: Home / Self Care | Payer: PPO | Source: Ambulatory Visit | Attending: Family Medicine | Admitting: Family Medicine

## 2016-02-02 DIAGNOSIS — E2839 Other primary ovarian failure: Secondary | ICD-10-CM | POA: Diagnosis not present

## 2016-02-02 DIAGNOSIS — M899 Disorder of bone, unspecified: Secondary | ICD-10-CM | POA: Diagnosis not present

## 2016-02-02 DIAGNOSIS — M949 Disorder of cartilage, unspecified: Secondary | ICD-10-CM | POA: Insufficient documentation

## 2016-02-03 ENCOUNTER — Encounter: Payer: Self-pay | Admitting: Family Medicine

## 2016-02-18 ENCOUNTER — Other Ambulatory Visit: Payer: Self-pay | Admitting: Family Medicine

## 2016-02-23 DIAGNOSIS — I10 Essential (primary) hypertension: Secondary | ICD-10-CM | POA: Diagnosis not present

## 2016-02-23 DIAGNOSIS — M5416 Radiculopathy, lumbar region: Secondary | ICD-10-CM | POA: Diagnosis not present

## 2016-02-23 DIAGNOSIS — M48062 Spinal stenosis, lumbar region with neurogenic claudication: Secondary | ICD-10-CM | POA: Diagnosis not present

## 2016-02-23 DIAGNOSIS — Z6833 Body mass index (BMI) 33.0-33.9, adult: Secondary | ICD-10-CM | POA: Diagnosis not present

## 2016-02-24 DIAGNOSIS — Z79899 Other long term (current) drug therapy: Secondary | ICD-10-CM | POA: Diagnosis not present

## 2016-02-24 DIAGNOSIS — I10 Essential (primary) hypertension: Secondary | ICD-10-CM | POA: Diagnosis not present

## 2016-02-24 DIAGNOSIS — R809 Proteinuria, unspecified: Secondary | ICD-10-CM | POA: Diagnosis not present

## 2016-02-24 DIAGNOSIS — D509 Iron deficiency anemia, unspecified: Secondary | ICD-10-CM | POA: Diagnosis not present

## 2016-02-24 DIAGNOSIS — E559 Vitamin D deficiency, unspecified: Secondary | ICD-10-CM | POA: Diagnosis not present

## 2016-02-24 DIAGNOSIS — N183 Chronic kidney disease, stage 3 (moderate): Secondary | ICD-10-CM | POA: Diagnosis not present

## 2016-02-27 ENCOUNTER — Telehealth: Payer: Self-pay | Admitting: Family Medicine

## 2016-02-28 DIAGNOSIS — E1129 Type 2 diabetes mellitus with other diabetic kidney complication: Secondary | ICD-10-CM | POA: Diagnosis not present

## 2016-02-28 DIAGNOSIS — E559 Vitamin D deficiency, unspecified: Secondary | ICD-10-CM | POA: Diagnosis not present

## 2016-02-28 DIAGNOSIS — I509 Heart failure, unspecified: Secondary | ICD-10-CM | POA: Diagnosis not present

## 2016-02-28 DIAGNOSIS — N250 Renal osteodystrophy: Secondary | ICD-10-CM | POA: Diagnosis not present

## 2016-02-28 DIAGNOSIS — D649 Anemia, unspecified: Secondary | ICD-10-CM | POA: Diagnosis not present

## 2016-02-28 DIAGNOSIS — N183 Chronic kidney disease, stage 3 (moderate): Secondary | ICD-10-CM | POA: Diagnosis not present

## 2016-03-01 ENCOUNTER — Other Ambulatory Visit: Payer: Self-pay | Admitting: Endocrinology

## 2016-03-04 ENCOUNTER — Other Ambulatory Visit: Payer: Self-pay | Admitting: Family Medicine

## 2016-03-09 NOTE — Telephone Encounter (Signed)
Appt scheduled for 03-13-2016

## 2016-03-10 ENCOUNTER — Other Ambulatory Visit: Payer: Self-pay | Admitting: Family Medicine

## 2016-03-13 ENCOUNTER — Ambulatory Visit (INDEPENDENT_AMBULATORY_CARE_PROVIDER_SITE_OTHER): Payer: PPO

## 2016-03-13 VITALS — BP 138/76 | HR 78 | Temp 98.7°F | Ht 63.0 in | Wt 201.1 lb

## 2016-03-13 DIAGNOSIS — Z0000 Encounter for general adult medical examination without abnormal findings: Secondary | ICD-10-CM

## 2016-03-13 NOTE — Patient Instructions (Addendum)
Health maintenance: You are due for your shingles vaccine, please check with your insurance company to see if they cover any part of this vaccine. If you decide you want this vaccine please contact our office.   Abnormal screenings: None   Patient concerns: None   Nurse concerns: Obesity. Recommend eating 3 meals a day and cutting back on late night snacks.   Next PCP appt: Follow up with Dr. Moshe Cipro on 06/05/2016 at 10:20 am. Follow up in 1 year for your annual wellness visit.   Advance directive discussed with patient today. Copy provided for patient to complete at home and have notarized. Patient agrees to have copy sent to our office once it is complete.   Preventive Care 66 Years and Older, Female Preventive care refers to lifestyle choices and visits with your health care provider that can promote health and wellness. What does preventive care include?  A yearly physical exam. This is also called an annual well check.  Dental exams once or twice a year.  Routine eye exams. Ask your health care provider how often you should have your eyes checked.  Personal lifestyle choices, including:  Daily care of your teeth and gums.  Regular physical activity.  Eating a healthy diet.  Avoiding tobacco and drug use.  Limiting alcohol use.  Taking low-dose aspirin every day.  Taking vitamin and mineral supplements as recommended by your health care provider. What happens during an annual well check? The services and screenings done by your health care provider during your annual well check will depend on your age, overall health, lifestyle risk factors, and family history of disease. Counseling  Your health care provider may ask you questions about your:  Alcohol use.  Tobacco use.  Drug use.  Emotional well-being.  Home and relationship well-being.  Sexual activity.  Eating habits.  History of falls.  Memory and ability to understand (cognition).  Work and work  Statistician.  Reproductive health. Screening  You may have the following tests or measurements:  Height, weight, and BMI.  Blood pressure.  Lipid and cholesterol levels. These may be checked every 5 years, or more frequently if you are over 38 years old.  Skin check.  Lung cancer screening. You may have this screening every year starting at age 90 if you have a 30-pack-year history of smoking and currently smoke or have quit within the past 15 years.  Fecal occult blood test (FOBT) of the stool. You may have this test every year starting at age 52.  Flexible sigmoidoscopy or colonoscopy. You may have a sigmoidoscopy every 5 years or a colonoscopy every 10 years starting at age 62.  Hepatitis C blood test.  Diabetes screening. This is done by checking your blood sugar (glucose) after you have not eaten for a while (fasting). You may have this done every 1-3 years.  Bone density scan. This is done to screen for osteoporosis. You may have this done starting at age 90.  Mammogram. This may be done every 1-2 years. Talk to your health care provider about how often you should have regular mammograms. Talk with your health care provider about your test results, treatment options, and if necessary, the need for more tests. Vaccines  Your health care provider may recommend certain vaccines, such as:  Influenza vaccine. This is recommended every year.  Tetanus, diphtheria, and acellular pertussis (Tdap, Td) vaccine. You may need a Td booster every 10 years.  Zoster vaccine. You may need this after age 94.Marland Kitchen  Pneumococcal 13-valent conjugate (PCV13) vaccine. One dose is recommended after age 10.  Pneumococcal polysaccharide (PPSV23) vaccine. One dose is recommended after age 45.  Talk to your health care provider about which screenings and vaccines you need and how often you need them. This information is not intended to replace advice given to you by your health care provider. Make  sure you discuss any questions you have with your health care provider. Document Released: 02/11/2015 Document Revised: 10/05/2015 Document Reviewed: 11/16/2014 Elsevier Interactive Patient Education  2017 [employer].

## 2016-03-13 NOTE — Progress Notes (Signed)
Subjective:   Krista Strickland is a 66 y.o. female who presents for Medicare Annual (Subsequent) preventive examination.  Review of Systems: N/A  Cardiac Risk Factors include: advanced age (>51men, >52 women);diabetes mellitus;dyslipidemia;hypertension;family history of premature cardiovascular disease;obesity (BMI >30kg/m2)     Objective:     Vitals: BP 138/76   Pulse 78   Temp 98.7 F (37.1 C) (Oral)   Ht 5\' 3"  (1.6 m)   Wt 201 lb 1.3 oz (91.2 kg)   SpO2 96%   BMI 35.62 kg/m   Body mass index is 35.62 kg/m.   Tobacco History  Smoking Status  . Former Smoker  . Packs/day: 0.25  . Years: 12.00  . Quit date: 07/16/1978  Smokeless Tobacco  . Never Used     Counseling given: Not Answered   Past Medical History:  Diagnosis Date  . Chronic kidney disease   . Coronary artery disease   . Diabetes mellitus, type 2 ([place])   . GERD (gastroesophageal reflux disease)    no medication in 2017  . Gout   . History of MRSA infection 03/2009   on ant abdomen   . Hypercalcemia 2017   managed by nephrology  . Hyperlipidemia   . Hypertension   . Obesity   . Psoriasis   . Stroke [facility])    Past Surgical History:  Procedure Laterality Date  . APPENDECTOMY  2012  . excison of rt breast cyst    . LAPAROSCOPIC SALPINGO OOPHERECTOMY Right 04/22/2012   Procedure: LAPAROSCOPIC SALPINGO OOPHORECTOMY;  Surgeon: Jonnie Kind, MD;  Location: [facility];  Service: Gynecology;  Laterality: Right;  end 11:17  . MASS EXCISION N/A 04/22/2012   Procedure: EXCISION SKIN TAGS NECK AND HEAD;  Surgeon: Jonnie Kind, MD;  Location: [facility];  Service: Gynecology;  Laterality: N/A;  start 11:19  . PARTIAL HYSTERECTOMY    . rt,. neck biopsy     Family History  Problem Relation Age of Onset  . Hypertension Brother   . Gout Brother   . Prostate cancer Brother   . Hypertension Brother   . Prostate cancer Brother   . Gout Brother   . Hypertension Sister   . Gout Sister   . Cancer Sister 34   pancreatic   . Leukemia Sister 85  . Gout Sister   . Prostate cancer Brother   . Diabetes Neg Hx    History  Sexual Activity  . Sexual activity: Yes    Outpatient Encounter Prescriptions as of 03/13/2016  Medication Sig  . allopurinol (ZYLOPRIM) 300 MG tablet TAKE ONE TABLET BY MOUTH ONCE DAILY  . aspirin 325 MG tablet Take 325 mg by mouth daily.  . calcitRIOL (ROCALTROL) 0.25 MCG capsule Take 0.25 mcg by mouth daily.  . cyclobenzaprine (FLEXERIL) 10 MG tablet Take 10 mg by mouth 3 (three) times daily as needed. Muscle Spasms  . diltiazem (DILACOR XR) 180 MG 24 hr capsule TAKE ONE CAPSULE BY MOUTH ONCE DAILY  . docusate sodium (COLACE) 100 MG capsule Take 100 mg by mouth 2 (two) times daily. Constipation  . ergocalciferol (VITAMIN D2) 50000 units capsule Take 1 capsule (50,000 Units total) by mouth once a week. One capsule once weekly  . ezetimibe (ZETIA) 10 MG tablet TAKE ONE TABLET BY MOUTH ONCE DAILY  . gabapentin (NEURONTIN) 100 MG capsule Take 1 capsule (100 mg total) by mouth at bedtime.  Marland Kitchen HYDROcodone-acetaminophen (NORCO) 7.5-325 MG per tablet Take 1 tablet by mouth every 4 (four) hours  as needed. Pain  . insulin NPH-regular Human (NOVOLIN 70/30) (70-30) 100 UNIT/ML injection Inject 170 Units into the skin daily with breakfast. (Patient taking differently: Inject 170 Units into the skin daily with breakfast. 170 UNITS DAILY WITH BREAKFAST AND 10 UNITS DAILY AT 5PM)  . insulin regular (NOVOLIN R RELION) 250 units/2.89mL (100 units/mL) injection Inject 0.4 mLs (40 Units total) into the skin daily with supper. (Patient taking differently: Inject 30 Units into the skin daily with supper. )  . metolazone (ZAROXOLYN) 5 MG tablet TAKE ONE TABLET BY MOUTH ONCE DAILY  . metoprolol (LOPRESSOR) 100 MG tablet TAKE ONE TABLET BY MOUTH TWICE DAILY  . midodrine (PROAMATINE) 5 MG tablet Take 5 mg by mouth daily.  . ONE TOUCH ULTRA TEST test strip USE ONE STRIP TO CHECK GLUCOSE TWICE DAILY  .  ONETOUCH DELICA LANCETS 75Z MISC 1 Device by Does not apply route 2 (two) times daily.  . potassium chloride (K-DUR) 10 MEQ tablet TAKE THREE TABLETS BY MOUTH THREE TIMES DAILY  . pravastatin (PRAVACHOL) 20 MG tablet TAKE ONE TABLET BY MOUTH ONCE DAILY WITH  BREAKFAST  . RELION INSULIN SYR 1CC/30G 30G X 5/16" 1 ML MISC USE ONE SYRINGE TO INJECT INSULIN SUBCUTANEOUSLY THREE TIMES DAILY AS DIRECTED  . RELION INSULIN SYRINGE 31G X 15/64" 1 ML MISC USE ONE SYRINGE THREE TIMES DAILY AS DIRECTED  . spironolactone (ALDACTONE) 25 MG tablet TAKE ONE TABLET BY MOUTH ONCE DAILY  . torsemide (DEMADEX) 20 MG tablet Take 20 mg by mouth daily. TAKE 4 TABLETS EVERY DAY (80MG )  . [DISCONTINUED] NOVOLIN 70/30 RELION (70-30) 100 UNIT/ML injection INJECT 190 UNITS SUBCUTANEOUSLY DAILY WITH BREAKFAST (Patient taking differently: INJECT 170 UNITS SUBCUTANEOUSLY DAILY WITH BREAKFAST)   No facility-administered encounter medications on file as of 03/13/2016.     Activities of Daily Living In your present state of health, do you have any difficulty performing the following activities: 03/13/2016  Hearing? N  Vision? N  Difficulty concentrating or making decisions? N  Walking or climbing stairs? Y  Dressing or bathing? N  Doing errands, shopping? N  Preparing Food and eating ? Y  Using the Toilet? N  In the past six months, have you accidently leaked urine? N  Do you have problems with loss of bowel control? N  Managing your Medications? N  Managing your Finances? N  Housekeeping or managing your Housekeeping? N  Some recent data might be hidden    Patient Care Team: Fayrene Helper, MD as PCP - General Phillips Odor, MD as Attending Physician (Neurology) Fran Lowes, MD as Attending Physician (Nephrology) Renato Shin, MD as Consulting Physician (Endocrinology) Nelwyn Salisbury Ardis Rowan, PA-C as Physician Assistant (Neurosurgery)    Assessment:    Exercise Activities and Dietary  recommendations Current Exercise Habits: Home exercise routine, Type of exercise: walking;stretching, Time (Minutes): 30, Frequency (Times/Week): 5, Weekly Exercise (Minutes/Week): 150, Intensity: Mild  Goals    . Have 3 meals a day          Recommend eating 3 balanced meals a day.      Fall Risk Fall Risk  03/13/2016 07/20/2015 04/08/2014 07/09/2012  Falls in the past year? No No No No   Depression Screen PHQ 2/9 Scores 03/13/2016 07/20/2015 09/14/2013  PHQ - 2 Score 0 0 0  PHQ- 9 Score - 7 6     Cognitive Function: Normal MMSE - Mini Mental State Exam 04/08/2014  Orientation to time 5  Orientation to Place 5  Registration 3  Attention/ Calculation 5  Recall 2  Language- name 2 objects 2  Language- repeat 1  Language- follow 3 step command 3  Language- read & follow direction 1  Write a sentence 1  Copy design 1  Total score 29     6CIT Screen 03/13/2016  What Year? 0 points  What month? 0 points  What time? 0 points  Count back from 20 0 points  Months in reverse 0 points  Repeat phrase 0 points  Total Score 0    Immunization History  Administered Date(s) Administered  . H1N1 01/01/2008  . Influenza Split 10/24/2011  . Influenza Whole 11/04/2006, 10/28/2007, 11/04/2008, 11/07/2009, 10/11/2010  . Influenza,inj,Quad PF,36+ Mos 10/28/2012, 12/16/2013, 11/30/2014, 11/09/2015  . Pneumococcal Conjugate-13 08/05/2013  . Pneumococcal Polysaccharide-23 06/07/2004, 11/07/2009, 07/20/2015  . Td 09/21/2002  . Tdap 12/16/2013   Screening Tests Health Maintenance  Topic Date Due  . ZOSTAVAX  09/19/2016 (Originally 03/27/2010)  . COLONOSCOPY  04/11/2016  . HEMOGLOBIN A1C  06/10/2016  . OPHTHALMOLOGY EXAM  10/04/2016  . PAP SMEAR  12/16/2016  . URINE MICROALBUMIN  01/24/2017  . FOOT EXAM  01/25/2017  . MAMMOGRAM  01/10/2018  . TETANUS/TDAP  12/17/2023  . INFLUENZA VACCINE  Completed  . DEXA SCAN  Completed  . Hepatitis C Screening  Completed  . HIV Screening  Completed   . PNA vac Low Risk Adult  Completed      Plan:  I have personally reviewed and addressed the Medicare Annual Wellness questionnaire and have noted the following in the patient's chart:  A. Medical and social history B. Use of alcohol, tobacco or illicit drugs  C. Current medications and supplements D. Functional ability and status E.  Nutritional status F.  Physical activity G. Advance directives discussed today H. List of other physicians I.  Hospitalizations, surgeries, and ER visits in previous 12 months J.  [place] to include cognitive, depression, and falls L. Referrals and appointments - none  In addition, I have reviewed and discussed with patient certain preventive protocols, quality metrics, and best practice recommendations. A written personalized care plan for preventive services as well as general preventive health recommendations were provided to patient.  Signed,   Stormy Fabian, LPN Lead Nurse Health Advisor

## 2016-03-22 ENCOUNTER — Telehealth: Payer: Self-pay | Admitting: Orthopaedic Surgery

## 2016-03-22 ENCOUNTER — Other Ambulatory Visit: Payer: Self-pay | Admitting: Family Medicine

## 2016-04-10 ENCOUNTER — Ambulatory Visit: Payer: PPO | Admitting: Endocrinology

## 2016-04-12 ENCOUNTER — Encounter: Payer: Self-pay | Admitting: Endocrinology

## 2016-04-12 ENCOUNTER — Ambulatory Visit (INDEPENDENT_AMBULATORY_CARE_PROVIDER_SITE_OTHER): Payer: PPO | Admitting: Endocrinology

## 2016-04-12 VITALS — BP 136/70 | HR 66 | Ht 63.0 in | Wt 201.0 lb

## 2016-04-12 DIAGNOSIS — E119 Type 2 diabetes mellitus without complications: Secondary | ICD-10-CM

## 2016-04-12 DIAGNOSIS — IMO0001 Reserved for inherently not codable concepts without codable children: Secondary | ICD-10-CM

## 2016-04-12 DIAGNOSIS — Z794 Long term (current) use of insulin: Secondary | ICD-10-CM

## 2016-04-12 LAB — POCT GLYCOSYLATED HEMOGLOBIN (HGB A1C): HEMOGLOBIN A1C: 7.5

## 2016-04-12 NOTE — Patient Instructions (Addendum)
check your blood sugar twice a day.  vary the time of day when you check, between before the 3 meals, and at bedtime.  also check if you have symptoms of your blood sugar being too high or too low.  please keep a record of the readings and bring it to your next appointment here.  You can write it on any piece of paper.  please call us sooner if your blood sugar goes below 70, or if you have a lot of readings over 200.   Please continue the same insulins.  On this type of insulin schedule, you should eat meals on a regular schedule.  If a meal is missed or significantly delayed, your blood sugar could go low.  Please come back for a follow-up appointment in 4 months.

## 2016-04-12 NOTE — Progress Notes (Signed)
Subjective:    Patient ID: Krista Strickland, female    DOB: [birth year], 66 y.o.   MRN: [id number]  HPI  Pt returns for f/u of diabetes.   DM type: insulin-requiring type 2 Dx'ed: 9476 Complications: polyneuropathy, renal insufficiency and CVA.  Therapy: insulin since 2010 GDM: never DKA: never Severe hypoglycemia: never.   Pancreatitis: never.   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 she cannot afford analogs, so we had to try 70/30 (the NPH caused no reaction this time).  She takes 70/30 QAM and reg QPM, due to the pattern of cbg's.   Interval history:  pt states she feels well in general.  She says she never misses the insulin.  Meter is downloaded today, and the printout is scanned into the record.  cbg varies from 80-450.  It is lowest fasting and in the afternoon, and highest at hs.  However, it is most variable at HS.   Pt also has small multinodular goiter (euthyroid; f/u US in 2017 showed no change).   Past Medical History:  Diagnosis Date  . Chronic kidney disease   . Coronary artery disease   . Diabetes mellitus, type 2 ([place])   . GERD (gastroesophageal reflux disease)    no medication in 2017  . Gout   . History of MRSA infection 03/2009   on ant abdomen   . Hypercalcemia 2017   managed by nephrology  . Hyperlipidemia   . Hypertension   . Obesity   . Psoriasis   . Stroke [facility])     Past Surgical History:  Procedure Laterality Date  . APPENDECTOMY  2012  . excison of rt breast cyst    . LAPAROSCOPIC SALPINGO OOPHERECTOMY Right 04/22/2012   Procedure: LAPAROSCOPIC SALPINGO OOPHORECTOMY;  Surgeon: Jonnie Kind, MD;  Location: [facility];  Service: Gynecology;  Laterality: Right;  end 11:17  . MASS EXCISION N/A 04/22/2012   Procedure: EXCISION SKIN TAGS NECK AND HEAD;  Surgeon: Jonnie Kind, MD;  Location: [facility];  Service: Gynecology;  Laterality: N/A;  start 11:19  .  PARTIAL HYSTERECTOMY    . rt,. neck biopsy      Social History   Social History  . Marital status: Married    Spouse name: N/A  . Number of children: 1  . Years of education: N/A   Occupational History  . disabled  Unemployed   Social History Main Topics  . Smoking status: Former Smoker    Packs/day: 0.25    Years: 12.00    Quit date: 07/16/1978  . Smokeless tobacco: Never Used  . Alcohol use No  . Drug use: No  . Sexual activity: Yes   Other Topics Concern  . Not on file   Social History Narrative  . No narrative on file    Current Outpatient Prescriptions on File Prior to Visit  Medication Sig Dispense Refill  . allopurinol (ZYLOPRIM) 300 MG tablet TAKE ONE TABLET BY MOUTH ONCE DAILY 30 tablet 5  . aspirin 325 MG tablet Take 325 mg by mouth daily.    . calcitRIOL (ROCALTROL) 0.25 MCG capsule Take 0.25 mcg by mouth daily.    . cyclobenzaprine (FLEXERIL) 10 MG tablet Take 10 mg by mouth 3 (three) times daily as needed. Muscle Spasms    . diltiazem (DILACOR XR) 180 MG 24 hr capsule TAKE ONE CAPSULE BY MOUTH ONCE DAILY 90 capsule 0  . docusate  sodium (COLACE) 100 MG capsule Take 100 mg by mouth 2 (two) times daily. Constipation    . ergocalciferol (VITAMIN D2) 50000 units capsule Take 1 capsule (50,000 Units total) by mouth once a week. One capsule once weekly 4 capsule 11  . ezetimibe (ZETIA) 10 MG tablet TAKE ONE TABLET BY MOUTH ONCE DAILY 90 tablet 1  . gabapentin (NEURONTIN) 100 MG capsule Take 1 capsule (100 mg total) by mouth at bedtime. 90 capsule 3  . HYDROcodone-acetaminophen (NORCO) 7.5-325 MG per tablet Take 1 tablet by mouth every 4 (four) hours as needed. Pain    . insulin NPH-regular Human (NOVOLIN 70/30) (70-30) 100 UNIT/ML injection Inject 170 Units into the skin daily with breakfast. (Patient taking differently: Inject 170 Units into the skin daily with breakfast. 170 UNITS DAILY WITH BREAKFAST AND 10 UNITS DAILY AT 5PM) 90 mL 11  . insulin regular (NOVOLIN  R RELION) 250 units/2.71mL (100 units/mL) injection Inject 0.4 mLs (40 Units total) into the skin daily with supper. (Patient taking differently: Inject 30 Units into the skin daily with supper. ) 10 mL 11  . metolazone (ZAROXOLYN) 5 MG tablet TAKE ONE TABLET BY MOUTH ONCE DAILY 90 tablet 0  . metoprolol (LOPRESSOR) 100 MG tablet TAKE ONE TABLET BY MOUTH TWICE DAILY 180 tablet 0  . midodrine (PROAMATINE) 5 MG tablet Take 5 mg by mouth daily.    . ONE TOUCH ULTRA TEST test strip USE ONE STRIP TO CHECK GLUCOSE TWICE DAILY 100 each 11  . ONETOUCH DELICA LANCETS 29N MISC 1 Device by Does not apply route 2 (two) times daily. 60 each 11  . potassium chloride (K-DUR) 10 MEQ tablet TAKE THREE TABLETS BY MOUTH THREE TIMES DAILY 270 tablet 5  . pravastatin (PRAVACHOL) 20 MG tablet TAKE ONE TABLET BY MOUTH ONCE DAILY WITH BREAKFAST 90 tablet 1  . RELION INSULIN SYR 1CC/30G 30G X 5/16" 1 ML MISC USE ONE SYRINGE TO INJECT INSULIN SUBCUTANEOUSLY THREE TIMES DAILY AS DIRECTED 150 each 0  . RELION INSULIN SYRINGE 31G X 15/64" 1 ML MISC USE ONE SYRINGE THREE TIMES DAILY AS DIRECTED 150 each 0  . spironolactone (ALDACTONE) 25 MG tablet TAKE ONE TABLET BY MOUTH ONCE DAILY 90 tablet 1  . torsemide (DEMADEX) 20 MG tablet Take 20 mg by mouth daily. TAKE 4 TABLETS EVERY DAY (80MG )     No current facility-administered medications on file prior to visit.     Allergies  Allergen Reactions  . Benazepril Swelling  . Fish Allergy   . Metronidazole Hives  . Mobic [Meloxicam] Hives  . Penicillins Swelling  . Sulfonamide Derivatives Hives    Family History  Problem Relation Age of Onset  . Hypertension Brother   . Gout Brother   . Prostate cancer Brother   . Hypertension Brother   . Prostate cancer Brother   . Gout Brother   . Hypertension Sister   . Gout Sister   . Cancer Sister 58    pancreatic   . Leukemia Sister 27  . Gout Sister   . Prostate cancer Brother   . Diabetes Neg Hx     BP 136/70   Pulse  66   Ht 5\' 3"  (1.6 m)   Wt 201 lb (91.2 kg)   SpO2 96%   BMI 35.61 kg/m    Review of Systems She denies hypoglycemia.      Objective:   Physical Exam VITAL SIGNS:  See vs page GENERAL: no distress.  Pulses: dorsalis pedis intact  bilat.  MSK: no deformity of the feet CV: 1+ bilat leg edema.   Skin: no ulcer on the feet. normal color and temp on the feet. Neuro: sensation is intact to touch on the feet.  Ext: There is bilateral onychomycosis of the toenails.    a1c=7.5%    Assessment & Plan:  Insulin-requiring type 2 DM, with renal insufficiency: this is the best control this pt should aim for, given this regimen, which does match insulin to her changing needs throughout the day.   Patient is advised the following: Patient Instructions  check your blood sugar twice a day.  vary the time of day when you check, between before the 3 meals, and at bedtime.  also check if you have symptoms of your blood sugar being too high or too low.  please keep a record of the readings and bring it to your next appointment here.  You can write it on any piece of paper.  please call us sooner if your blood sugar goes below 70, or if you have a lot of readings over 200.   Please continue the same insulins.  On this type of insulin schedule, you should eat meals on a regular schedule.  If a meal is missed or significantly delayed, your blood sugar could go low.  Please come back for a follow-up appointment in 4 months.

## 2016-04-24 ENCOUNTER — Other Ambulatory Visit: Payer: Self-pay | Admitting: Endocrinology

## 2016-05-17 DIAGNOSIS — M4310 Spondylolisthesis, site unspecified: Secondary | ICD-10-CM | POA: Diagnosis not present

## 2016-05-17 DIAGNOSIS — M48062 Spinal stenosis, lumbar region with neurogenic claudication: Secondary | ICD-10-CM | POA: Diagnosis not present

## 2016-05-17 DIAGNOSIS — I10 Essential (primary) hypertension: Secondary | ICD-10-CM | POA: Diagnosis not present

## 2016-05-17 DIAGNOSIS — M5416 Radiculopathy, lumbar region: Secondary | ICD-10-CM | POA: Diagnosis not present

## 2016-05-28 DIAGNOSIS — R2681 Unsteadiness on feet: Secondary | ICD-10-CM | POA: Diagnosis not present

## 2016-05-28 DIAGNOSIS — I69898 Other sequelae of other cerebrovascular disease: Secondary | ICD-10-CM | POA: Diagnosis not present

## 2016-05-28 DIAGNOSIS — E1143 Type 2 diabetes mellitus with diabetic autonomic (poly)neuropathy: Secondary | ICD-10-CM | POA: Diagnosis not present

## 2016-05-28 DIAGNOSIS — I951 Orthostatic hypotension: Secondary | ICD-10-CM | POA: Diagnosis not present

## 2016-05-28 DIAGNOSIS — E785 Hyperlipidemia, unspecified: Secondary | ICD-10-CM | POA: Diagnosis not present

## 2016-06-01 DIAGNOSIS — Z794 Long term (current) use of insulin: Secondary | ICD-10-CM | POA: Diagnosis not present

## 2016-06-01 DIAGNOSIS — D509 Iron deficiency anemia, unspecified: Secondary | ICD-10-CM | POA: Diagnosis not present

## 2016-06-01 DIAGNOSIS — R809 Proteinuria, unspecified: Secondary | ICD-10-CM | POA: Diagnosis not present

## 2016-06-01 DIAGNOSIS — E119 Type 2 diabetes mellitus without complications: Secondary | ICD-10-CM | POA: Diagnosis not present

## 2016-06-01 DIAGNOSIS — E0849 Diabetes mellitus due to underlying condition with other diabetic neurological complication: Secondary | ICD-10-CM | POA: Diagnosis not present

## 2016-06-01 DIAGNOSIS — M949 Disorder of cartilage, unspecified: Secondary | ICD-10-CM | POA: Diagnosis not present

## 2016-06-01 DIAGNOSIS — M899 Disorder of bone, unspecified: Secondary | ICD-10-CM | POA: Diagnosis not present

## 2016-06-01 DIAGNOSIS — Z79899 Other long term (current) drug therapy: Secondary | ICD-10-CM | POA: Diagnosis not present

## 2016-06-01 DIAGNOSIS — N183 Chronic kidney disease, stage 3 (moderate): Secondary | ICD-10-CM | POA: Diagnosis not present

## 2016-06-01 DIAGNOSIS — I10 Essential (primary) hypertension: Secondary | ICD-10-CM | POA: Diagnosis not present

## 2016-06-01 DIAGNOSIS — E781 Pure hyperglyceridemia: Secondary | ICD-10-CM | POA: Diagnosis not present

## 2016-06-01 DIAGNOSIS — E559 Vitamin D deficiency, unspecified: Secondary | ICD-10-CM | POA: Diagnosis not present

## 2016-06-02 LAB — COMPLETE METABOLIC PANEL WITH GFR
ALT: 28 U/L (ref 6–29)
AST: 24 U/L (ref 10–35)
Albumin: 4.1 g/dL (ref 3.6–5.1)
Alkaline Phosphatase: 75 U/L (ref 33–130)
BUN: 74 mg/dL — AB (ref 7–25)
CHLORIDE: 97 mmol/L — AB (ref 98–110)
CO2: 30 mmol/L (ref 20–31)
Calcium: 10.2 mg/dL (ref 8.6–10.4)
Creat: 2.03 mg/dL — ABNORMAL HIGH (ref 0.50–0.99)
GFR, EST NON AFRICAN AMERICAN: 25 mL/min — AB (ref >=60)
GFR, Est African American: 29 mL/min — ABNORMAL LOW (ref >=60)
GLUCOSE: 187 mg/dL — AB (ref 65–99)
POTASSIUM: 4.2 mmol/L (ref 3.5–5.3)
SODIUM: 139 mmol/L (ref 135–146)
Total Bilirubin: 0.4 mg/dL (ref 0.2–1.2)
Total Protein: 6.7 g/dL (ref 6.1–8.1)

## 2016-06-02 LAB — LIPID PANEL
CHOL/HDL RATIO: 5.5 ratio — AB (ref <5.0)
CHOLESTEROL: 122 mg/dL (ref <200)
HDL: 22 mg/dL — ABNORMAL LOW (ref >50)
LDL Cholesterol: 45 mg/dL (ref <100)
TRIGLYCERIDES: 275 mg/dL — AB (ref <150)
VLDL: 55 mg/dL — ABNORMAL HIGH (ref <30)

## 2016-06-02 LAB — CBC
HCT: 40.4 % (ref 35.0–45.0)
Hemoglobin: 13.1 g/dL (ref 11.7–15.5)
MCH: 29.6 pg (ref 27.0–33.0)
MCHC: 32.4 g/dL (ref 32.0–36.0)
MCV: 91.4 fL (ref 80.0–100.0)
MPV: 11.4 fL (ref 7.5–12.5)
PLATELETS: 222 10*3/uL (ref 140–400)
RBC: 4.42 MIL/uL (ref 3.80–5.10)
RDW: 16.4 % — AB (ref 11.0–15.0)
WBC: 8.3 10*3/uL (ref 3.8–10.8)

## 2016-06-02 LAB — VITAMIN D 25 HYDROXY (VIT D DEFICIENCY, FRACTURES): VIT D 25 HYDROXY: 29 ng/mL — AB (ref 30–100)

## 2016-06-05 ENCOUNTER — Encounter: Payer: Self-pay | Admitting: Family Medicine

## 2016-06-05 ENCOUNTER — Ambulatory Visit (INDEPENDENT_AMBULATORY_CARE_PROVIDER_SITE_OTHER): Payer: PPO | Admitting: Family Medicine

## 2016-06-05 VITALS — BP 130/70 | HR 75 | Resp 16 | Ht 63.0 in | Wt 201.0 lb

## 2016-06-05 DIAGNOSIS — IMO0001 Reserved for inherently not codable concepts without codable children: Secondary | ICD-10-CM

## 2016-06-05 DIAGNOSIS — E785 Hyperlipidemia, unspecified: Secondary | ICD-10-CM | POA: Diagnosis not present

## 2016-06-05 DIAGNOSIS — E119 Type 2 diabetes mellitus without complications: Secondary | ICD-10-CM

## 2016-06-05 DIAGNOSIS — E781 Pure hyperglyceridemia: Secondary | ICD-10-CM | POA: Diagnosis not present

## 2016-06-05 DIAGNOSIS — R809 Proteinuria, unspecified: Secondary | ICD-10-CM | POA: Diagnosis not present

## 2016-06-05 DIAGNOSIS — D649 Anemia, unspecified: Secondary | ICD-10-CM | POA: Diagnosis not present

## 2016-06-05 DIAGNOSIS — E790 Hyperuricemia without signs of inflammatory arthritis and tophaceous disease: Secondary | ICD-10-CM | POA: Diagnosis not present

## 2016-06-05 DIAGNOSIS — N200 Calculus of kidney: Secondary | ICD-10-CM | POA: Diagnosis not present

## 2016-06-05 DIAGNOSIS — Z1211 Encounter for screening for malignant neoplasm of colon: Secondary | ICD-10-CM

## 2016-06-05 DIAGNOSIS — I10 Essential (primary) hypertension: Secondary | ICD-10-CM

## 2016-06-05 DIAGNOSIS — Z794 Long term (current) use of insulin: Secondary | ICD-10-CM | POA: Diagnosis not present

## 2016-06-05 DIAGNOSIS — E1129 Type 2 diabetes mellitus with other diabetic kidney complication: Secondary | ICD-10-CM | POA: Diagnosis not present

## 2016-06-05 DIAGNOSIS — N183 Chronic kidney disease, stage 3 unspecified: Secondary | ICD-10-CM

## 2016-06-05 DIAGNOSIS — N184 Chronic kidney disease, stage 4 (severe): Secondary | ICD-10-CM | POA: Diagnosis not present

## 2016-06-05 DIAGNOSIS — E559 Vitamin D deficiency, unspecified: Secondary | ICD-10-CM | POA: Diagnosis not present

## 2016-06-05 DIAGNOSIS — I509 Heart failure, unspecified: Secondary | ICD-10-CM | POA: Diagnosis not present

## 2016-06-05 NOTE — Patient Instructions (Addendum)
f/u in 5 month, call if you need me before   Please cut back / out butter, don't need this  Fasting lipid, cmp and eGFr and uric acid 1 week before next visit  I am no longer prescribing gabapentin and will send a note to your pain Doc Spero Geralds to ask her to to take over all your pain management  You are referred to Dr Laural Golden for screening colonoscopy which is due , his office will mail you a letter to call them  All the best with your health  exercise` Please work on good  health habits so that your health will improve. 1. Commitment to daily physical activity for 30 to 60  minutes, if you are able to do this.  2. Commitment to wise food choices. Aim for half of your  food intake to be vegetable and fruit, one quarter starchy foods, and one quarter protein. Try to eat on a regular schedule  3 meals per day, snacking between meals should be limited to vegetables or fruits or small portions of nuts. 64 ounces of water per day is generally recommended, unless you have specific health conditions, like heart failure or kidney failure where you will need to limit fluid intake.  3. Commitment to sufficient and a  good quality of physical and mental rest daily, generally between 6 to 8 hours per day.  WITH PERSISTANCE AND PERSEVERANCE, THE IMPOSSIBLE , BECOMES THE NORM!`   Thanks for choosing [facility], we consider it a privelige to serve you.

## 2016-06-06 ENCOUNTER — Encounter (INDEPENDENT_AMBULATORY_CARE_PROVIDER_SITE_OTHER): Payer: Self-pay | Admitting: *Deleted

## 2016-06-06 NOTE — Assessment & Plan Note (Signed)
Unchanged Patient re-educated about  the importance of commitment to a  minimum of 150 minutes of exercise per week.  The importance of healthy food choices with portion control discussed. Encouraged to start a food diary, count calories and to consider  joining a support group. Sample diet sheets offered. Goals set by the patient for the next several months.   Weight /BMI 06/05/2016 04/12/2016 03/13/2016  WEIGHT 201 lb 201 lb 201 lb 1.3 oz  HEIGHT 5\' 3"  5\' 3"  5\' 3"   BMI 35.61 kg/m2 35.61 kg/m2 35.62 kg/m2

## 2016-06-06 NOTE — Assessment & Plan Note (Signed)
Unchanged has appt with nephrology today following this appt

## 2016-06-06 NOTE — Addendum Note (Signed)
Addended by: Fayrene Helper on: 06/06/2016 06:28 AM   Modules accepted: Orders

## 2016-06-06 NOTE — Assessment & Plan Note (Signed)
Maintained on aspirin 325 mg daily

## 2016-06-06 NOTE — Assessment & Plan Note (Signed)
Controlled, no change in medication DASH diet and commitment to daily physical activity for a minimum of 30 minutes discussed and encouraged, as a part of hypertension management. The importance of attaining a healthy weight is also discussed.  BP/Weight 06/05/2016 04/12/2016 03/13/2016 01/25/2016 12/12/2015 11/09/2015 6/98/6148  Systolic BP 307 354 301 484 039 795 369  Diastolic BP 70 70 76 62 70 70 58  Wt. (Lbs) 201 201 201.08 205 201 205 207  BMI 35.61 35.61 35.62 36.31 35.61 36.31 36.68

## 2016-06-06 NOTE — Assessment & Plan Note (Signed)
Managed by endo Krista Strickland is reminded of the importance of commitment to daily physical activity for 30 minutes or more, as able and the need to limit carbohydrate intake to 30 to 60 grams per meal to help with blood sugar control.   The need to take medication as prescribed, test blood sugar as directed, and to call between visits if there is a concern that blood sugar is uncontrolled is also discussed.   Krista Strickland is reminded of the importance of daily foot exam, annual eye examination, and good blood sugar, blood pressure and cholesterol control.  Diabetic Labs Latest Ref Rng & Units 06/01/2016 04/12/2016 01/25/2016 01/20/2016 12/12/2015  HbA1c - - 7.5 - - 7.1  Microalbumin Not Estab. ug/mL - - <3.0(H) - -  Micro/Creat Ratio 0.0 - 30.0 mg/g creat - - <9.9 - -  Chol <200 mg/dL 122 - - 110 -  HDL >50 mg/dL 22(L) - - 25(L) -  Calc LDL <100 mg/dL 45 - - 44 -  Triglycerides <150 mg/dL 275(H) - - 203(H) -  Creatinine 0.50 - 0.99 mg/dL 2.03(H) - - 1.95(H) -   BP/Weight 06/05/2016 04/12/2016 03/13/2016 01/25/2016 12/12/2015 11/09/2015 0/12/2239  Systolic BP 146 431 427 670 110 034 961  Diastolic BP 70 70 76 62 70 70 58  Wt. (Lbs) 201 201 201.08 205 201 205 207  BMI 35.61 35.61 35.62 36.31 35.61 36.31 36.68   Foot/eye exam completion dates Latest Ref Rng & Units 04/12/2016 01/25/2016  Eye Exam No Retinopathy - -  Foot exam Order - - -  Foot Form Completion - Done Done

## 2016-06-06 NOTE — Assessment & Plan Note (Signed)
Uncontrolled, needs to reduce fatty food intake this is discussed, no additional medication change Hyperlipidemia:Low fat diet discussed and encouraged.   Lipid Panel  Lab Results  Component Value Date   CHOL 122 06/01/2016   HDL 22 (L) 06/01/2016   LDLCALC 45 06/01/2016   TRIG 275 (H) 06/01/2016   CHOLHDL 5.5 (H) 06/01/2016

## 2016-06-06 NOTE — Progress Notes (Signed)
Krista Strickland     MRN: [id number]      DOB: [birth year]   HPI Krista Strickland is here for follow up and re-evaluation of chronic medical conditions, medication management and review of any available recent lab and radiology data.  Preventive health is updated, specifically  Cancer screening and Immunization.   Questions or concerns regarding consultations or procedures which the PT has had in the interim are  addressed. Blood sugar has deteriorated slightly but is still controlled. Sees a pain specialist, reports the gabapentin is not benefiting the pain in her feet, and I am deferring all pain management to her specialist and will send a message The PT denies any adverse reactions to current medications since the last visit.   ROS Denies recent fever or chills. Denies sinus pressure, nasal congestion, ear pain or sore throat. Denies chest congestion, productive cough or wheezing. Denies chest pains, palpitations and leg swelling Denies abdominal pain, nausea, vomiting,diarrhea or constipation.   Denies dysuria, frequency, hesitancy or incontinence.  Denies depression, anxiety or insomnia. Denies skin break down or rash.   PE  BP 130/70   Pulse 75   Resp 16   Ht 5\' 3"  (1.6 m)   Wt 201 lb (91.2 kg)   SpO2 95%   BMI 35.61 kg/m   Patient alert and oriented and in no cardiopulmonary distress.  HEENT: No facial asymmetry, EOMI,   oropharynx pink and moist.  Neck supple no JVD, no mass.  Chest: Clear to auscultation bilaterally.  CVS: S1, S2 no murmurs, no S3.Regular rate.  ABD: Soft non tender.   Ext: No edema  MS: Adequate though reduced  ROM spine, normal in  shoulders, hips and knees.  Skin: Intact, no ulcerations or rash noted.  Psych: Good eye contact, normal affect. Memory intact not anxious or depressed appearing.  CNS: CN 2-12 intact, power,  normal throughout.no focal deficits noted.   Assessment & Plan  Essential hypertension Controlled, no change in  medication DASH diet and commitment to daily physical activity for a minimum of 30 minutes discussed and encouraged, as a part of hypertension management. The importance of attaining a healthy weight is also discussed.  BP/Weight 06/05/2016 04/12/2016 03/13/2016 01/25/2016 12/12/2015 11/09/2015 9/76/7341  Systolic BP 937 902 409 735 329 924 268  Diastolic BP 70 70 76 62 70 70 58  Wt. (Lbs) 201 201 201.08 205 201 205 207  BMI 35.61 35.61 35.62 36.31 35.61 36.31 36.68       Diabetes mellitus, insulin dependent (IDDM), controlled (Krista Strickland) Managed by endo Krista Strickland is reminded of the importance of commitment to daily physical activity for 30 minutes or more, as able and the need to limit carbohydrate intake to 30 to 60 grams per meal to help with blood sugar control.   The need to take medication as prescribed, test blood sugar as directed, and to call between visits if there is a concern that blood sugar is uncontrolled is also discussed.   Krista Strickland is reminded of the importance of daily foot exam, annual eye examination, and good blood sugar, blood pressure and cholesterol control.  Diabetic Labs Latest Ref Rng & Units 06/01/2016 04/12/2016 01/25/2016 01/20/2016 12/12/2015  HbA1c - - 7.5 - - 7.1  Microalbumin Not Estab. ug/mL - - <3.0(H) - -  Micro/Creat Ratio 0.0 - 30.0 mg/g creat - - <9.9 - -  Chol <200 mg/dL 122 - - 110 -  HDL >50 mg/dL 22(L) - - 25(L) -  Calc  LDL <100 mg/dL 45 - - 44 -  Triglycerides <150 mg/dL 275(H) - - 203(H) -  Creatinine 0.50 - 0.99 mg/dL 2.03(H) - - 1.95(H) -   BP/Weight 06/05/2016 04/12/2016 03/13/2016 01/25/2016 12/12/2015 11/09/2015 5/32/9924  Systolic BP 268 341 962 229 798 921 194  Diastolic BP 70 70 76 62 70 70 58  Wt. (Lbs) 201 201 201.08 205 201 205 207  BMI 35.61 35.61 35.62 36.31 35.61 36.31 36.68   Foot/eye exam completion dates Latest Ref Rng & Units 04/12/2016 01/25/2016  Eye Exam No Retinopathy - -  Foot exam Order - - -  Foot Form Completion -  Done Done        CKD (chronic kidney disease) stage 3, GFR 30-59 ml/min Unchanged has appt with nephrology today following this appt  Hypertriglyceridemia Uncontrolled, needs to reduce fatty food intake this is discussed, no additional medication change Hyperlipidemia:Low fat diet discussed and encouraged.   Lipid Panel  Lab Results  Component Value Date   CHOL 122 06/01/2016   HDL 22 (L) 06/01/2016   LDLCALC 45 06/01/2016   TRIG 275 (H) 06/01/2016   CHOLHDL 5.5 (H) 06/01/2016       Morbid obesity Unchanged Patient re-educated about  the importance of commitment to a  minimum of 150 minutes of exercise per week.  The importance of healthy food choices with portion control discussed. Encouraged to start a food diary, count calories and to consider  joining a support group. Sample diet sheets offered. Goals set by the patient for the next several months.   Weight /BMI 06/05/2016 04/12/2016 03/13/2016  WEIGHT 201 lb 201 lb 201 lb 1.3 oz  HEIGHT 5\' 3"  5\' 3"  5\' 3"   BMI 35.61 kg/m2 35.61 kg/m2 35.62 kg/m2      CVA (cerebral vascular accident) Maintained on aspirin 325 mg daily

## 2016-06-18 ENCOUNTER — Other Ambulatory Visit: Payer: Self-pay | Admitting: Endocrinology

## 2016-06-18 ENCOUNTER — Other Ambulatory Visit: Payer: Self-pay | Admitting: Family Medicine

## 2016-06-28 ENCOUNTER — Other Ambulatory Visit: Payer: Self-pay | Admitting: Family Medicine

## 2016-07-05 ENCOUNTER — Other Ambulatory Visit (INDEPENDENT_AMBULATORY_CARE_PROVIDER_SITE_OTHER): Payer: Self-pay | Admitting: *Deleted

## 2016-07-05 DIAGNOSIS — Z1211 Encounter for screening for malignant neoplasm of colon: Secondary | ICD-10-CM

## 2016-07-06 ENCOUNTER — Telehealth (INDEPENDENT_AMBULATORY_CARE_PROVIDER_SITE_OTHER): Payer: Self-pay | Admitting: *Deleted

## 2016-07-06 ENCOUNTER — Encounter (INDEPENDENT_AMBULATORY_CARE_PROVIDER_SITE_OTHER): Payer: Self-pay | Admitting: *Deleted

## 2016-07-06 DIAGNOSIS — Z1211 Encounter for screening for malignant neoplasm of colon: Secondary | ICD-10-CM | POA: Insufficient documentation

## 2016-07-06 NOTE — Telephone Encounter (Signed)
Patient needs trilyte 

## 2016-07-09 MED ORDER — PEG 3350-KCL-NA BICARB-NACL 420 G PO SOLR: 4000.0000 mL | Freq: Once | ORAL | 0 refills | Status: AC

## 2016-07-30 ENCOUNTER — Telehealth (INDEPENDENT_AMBULATORY_CARE_PROVIDER_SITE_OTHER): Payer: Self-pay | Admitting: *Deleted

## 2016-07-30 NOTE — Telephone Encounter (Signed)
agee 

## 2016-07-30 NOTE — Telephone Encounter (Signed)
Referring MD/PCP: simpson   Procedure: tcs  Reason/Indication:  screening  Has patient had this procedure before?  Yes, 10 yrs ago  If so, when, by whom and where?    Is there a family history of colon cancer?  no  Who?  What age when diagnosed?    Is patient diabetic?   yes      Does patient have prosthetic heart valve or mechanical valve?  no  Do you have a pacemaker?  no  Has patient ever had endocarditis? no  Has patient had joint replacement within last 12 months?  no  Does patient tend to be constipated or take laxatives? some  Does patient have a history of alcohol/drug use?  no  Is patient on Coumadin, Plavix and/or Aspirin? yes  Medications: see epic  Allergies: see epic  Medication Adjustment per Dr Laural Golden: asa 2 days, decrease novolin to 80 units day before and decrease novolin R to 15 units night before  Procedure date & time: 08/24/16 at 1030 preop 7/24 @ 11

## 2016-08-03 ENCOUNTER — Other Ambulatory Visit: Payer: Self-pay | Admitting: Endocrinology

## 2016-08-13 ENCOUNTER — Ambulatory Visit (INDEPENDENT_AMBULATORY_CARE_PROVIDER_SITE_OTHER): Payer: PPO | Admitting: Endocrinology

## 2016-08-13 ENCOUNTER — Encounter: Payer: Self-pay | Admitting: Endocrinology

## 2016-08-13 VITALS — BP 140/72 | HR 70 | Ht 63.0 in | Wt 200.0 lb

## 2016-08-13 DIAGNOSIS — E119 Type 2 diabetes mellitus without complications: Secondary | ICD-10-CM

## 2016-08-13 DIAGNOSIS — Z794 Long term (current) use of insulin: Secondary | ICD-10-CM

## 2016-08-13 DIAGNOSIS — IMO0001 Reserved for inherently not codable concepts without codable children: Secondary | ICD-10-CM

## 2016-08-13 LAB — POCT GLYCOSYLATED HEMOGLOBIN (HGB A1C): Hemoglobin A1C: 7.7

## 2016-08-13 MED ORDER — INSULIN NPH ISOPHANE & REGULAR (70-30) 100 UNIT/ML ~~LOC~~ SUSP: 170.0000 [IU] | Freq: Every day | SUBCUTANEOUS | 11 refills | Status: DC

## 2016-08-13 NOTE — Progress Notes (Addendum)
Subjective:    Patient ID: Krista Strickland, female    DOB: 1951-01-11, 66 y.o.   MRN: [id number]  HPI Pt returns for f/u of diabetes.   DM type: insulin-requiring type 2 Dx'ed: 3790 Complications: polyneuropathy, renal insufficiency and CVA.  Therapy: insulin since 2010 GDM: never DKA: never Severe hypoglycemia: never.   Pancreatitis: never.   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 she cannot afford analogs, so we had to try 70/30 (the NPH caused no reaction this time).  She takes 70/30 QAM and reg QPM, due to the pattern of cbg's.   Interval history:  pt states she feels well in general.  She says she never misses the insulin.  no cbg record, but states cbg's vary from 70-200's.  She takes 170 units qam and reg insulin, 30 units qpm. It is lowest fasting.  However, it is most variable at HS.   Pt also has small multinodular goiter (euthyroid; f/u US in 2017 showed no change).   Past Medical History:  Diagnosis Date  . Chronic kidney disease   . Coronary artery disease   . Diabetes mellitus, type 2 ([place])   . GERD (gastroesophageal reflux disease)    no medication in 2017  . Gout   . History of MRSA infection 03/2009   on ant abdomen   . Hypercalcemia 2017   managed by nephrology  . Hyperlipidemia   . Hypertension   . Obesity   . Psoriasis   . Stroke [facility])     Past Surgical History:  Procedure Laterality Date  . APPENDECTOMY  2012  . excison of rt breast cyst    . LAPAROSCOPIC SALPINGO OOPHERECTOMY Right 04/22/2012   Procedure: LAPAROSCOPIC SALPINGO OOPHORECTOMY;  Surgeon: Jonnie Kind, MD;  Location: [facility];  Service: Gynecology;  Laterality: Right;  end 11:17  . MASS EXCISION N/A 04/22/2012   Procedure: EXCISION SKIN TAGS NECK AND HEAD;  Surgeon: Jonnie Kind, MD;  Location: [facility];  Service: Gynecology;  Laterality: N/A;  start 11:19  . PARTIAL HYSTERECTOMY    . rt,. neck  biopsy      Social History   Social History  . Marital status: Married    Spouse name: N/A  . Number of children: 1  . Years of education: N/A   Occupational History  . disabled  Unemployed   Social History Main Topics  . Smoking status: Former Smoker    Packs/day: 0.25    Years: 12.00    Quit date: 07/16/1978  . Smokeless tobacco: Never Used  . Alcohol use No  . Drug use: No  . Sexual activity: Yes   Other Topics Concern  . Not on file   Social History Narrative  . No narrative on file    Current Outpatient Prescriptions on File Prior to Visit  Medication Sig Dispense Refill  . allopurinol (ZYLOPRIM) 300 MG tablet TAKE ONE TABLET BY MOUTH ONCE DAILY 30 tablet 5  . aspirin 325 MG tablet Take 325 mg by mouth daily.    . calcitRIOL (ROCALTROL) 0.25 MCG capsule Take 0.25 mcg by mouth daily.    . cyclobenzaprine (FLEXERIL) 10 MG tablet Take 10 mg by mouth 3 (three) times daily as needed. Muscle Spasms    . diltiazem (DILACOR XR) 180 MG 24 hr capsule TAKE 1 CAPSULE BY MOUTH ONCE DAILY 90 capsule 1  . docusate sodium (COLACE) 100 MG capsule Take  100 mg by mouth 2 (two) times daily. Constipation    . ergocalciferol (VITAMIN D2) 50000 units capsule Take 1 capsule (50,000 Units total) by mouth once a week. One capsule once weekly 4 capsule 11  . ezetimibe (ZETIA) 10 MG tablet TAKE ONE TABLET BY MOUTH ONCE DAILY 90 tablet 1  . HYDROcodone-acetaminophen (NORCO) 7.5-325 MG per tablet Take 1 tablet by mouth every 4 (four) hours as needed. Pain    . metolazone (ZAROXOLYN) 5 MG tablet TAKE 1 TABLET BY MOUTH ONCE DAILY 90 tablet 1  . metoprolol tartrate (LOPRESSOR) 100 MG tablet TAKE 1 TABLET BY MOUTH TWICE DAILY 180 tablet 1  . midodrine (PROAMATINE) 5 MG tablet Take 5 mg by mouth daily.    Marland Kitchen NOVOLIN R RELION 100 UNIT/ML injection INJECT 30 UNITS INTO THE SKIN DAILY WITH SUPPER 10 mL 4  . ONE TOUCH ULTRA TEST test strip USE ONE STRIP TO CHECK GLUCOSE TWICE DAILY 100 each 11  . ONETOUCH  DELICA LANCETS 24M MISC 1 Device by Does not apply route 2 (two) times daily. 60 each 11  . potassium chloride (K-DUR) 10 MEQ tablet TAKE THREE TABLETS BY MOUTH THREE TIMES DAILY 270 tablet 5  . pravastatin (PRAVACHOL) 20 MG tablet TAKE ONE TABLET BY MOUTH ONCE DAILY WITH BREAKFAST 90 tablet 1  . RELION INSULIN SYR 1CC/30G 30G X 5/16" 1 ML MISC USE ONE SYRINGE TO INJECT INSULIN SUBCUTANEOUSLY THREE TIMES DAILY AS DIRECTED 150 each 0  . RELION INSULIN SYRINGE 31G X 15/64" 1 ML MISC USE ONE SYRINGE THREE TIMES DAILY  AS DIRECTED. 150 each 0  . spironolactone (ALDACTONE) 25 MG tablet TAKE ONE TABLET BY MOUTH ONCE DAILY 90 tablet 1  . torsemide (DEMADEX) 20 MG tablet Take 20 mg by mouth daily. TAKE 4 TABLETS EVERY DAY (80MG )     No current facility-administered medications on file prior to visit.     Allergies  Allergen Reactions  . Benazepril Swelling  . Fish Allergy   . Metronidazole Hives  . Mobic [Meloxicam] Hives  . Penicillins Swelling  . Sulfonamide Derivatives Hives    Family History  Problem Relation Age of Onset  . Hypertension Brother   . Gout Brother   . Prostate cancer Brother   . Hypertension Brother   . Prostate cancer Brother   . Gout Brother   . Hypertension Sister   . Gout Sister   . Cancer Sister 1       pancreatic   . Leukemia Sister 63  . Gout Sister   . Prostate cancer Brother   . Diabetes Neg Hx     BP 140/72   Pulse 70   Ht 5\' 3"  (1.6 m)   Wt 200 lb (90.7 kg)   SpO2 97%   BMI 35.43 kg/m    Review of Systems She denies hypoglycemia    Objective:   Physical Exam VITAL SIGNS:  See vs page GENERAL: no distress.  Pulses: foot pulses are intact bilaterally.   MSK: no deformity of the feet or ankles.  CV: 1+ bilat edema of the legs.  Skin:  no ulcer on the feet or ankles.  normal color and temp on the feet and ankles.  Neuro: sensation is intact to touch on the feet and ankles.   Ext: There is bilateral onychomycosis of the toenails.    A1c=7.7%    Assessment & Plan:  Insulin-requiring type 2 DM, with renal insuff: this is the best control this pt should aim for,  given this regimen, which does match insulin to her changing needs throughout the day.   HTN: recheck next time.   Patient Instructions  check your blood sugar twice a day.  vary the time of day when you check, between before the 3 meals, and at bedtime.  also check if you have symptoms of your blood sugar being too high or too low.  please keep a record of the readings and bring it to your next appointment here.  You can write it on any piece of paper.  please call us sooner if your blood sugar goes below 70, or if you have a lot of readings over 200.   Please continue the same insulins.  On this type of insulin schedule, you should eat meals on a regular schedule.  If a meal is missed or significantly delayed, your blood sugar could go low.  Please come back for a follow-up appointment in 4 months.

## 2016-08-13 NOTE — Patient Instructions (Addendum)
check your blood sugar twice a day.  vary the time of day when you check, between before the 3 meals, and at bedtime.  also check if you have symptoms of your blood sugar being too high or too low.  please keep a record of the readings and bring it to your next appointment here.  You can write it on any piece of paper.  please call us sooner if your blood sugar goes below 70, or if you have a lot of readings over 200.   Please continue the same insulins.  On this type of insulin schedule, you should eat meals on a regular schedule.  If a meal is missed or significantly delayed, your blood sugar could go low.  Please come back for a follow-up appointment in 4 months.

## 2016-08-21 ENCOUNTER — Encounter (HOSPITAL_COMMUNITY)
Admission: RE | Admit: 2016-08-21 | Discharge: 2016-08-21 | Disposition: A | Discharge status: Home / Self Care | Payer: PPO | Source: Ambulatory Visit | Attending: Internal Medicine | Admitting: Internal Medicine

## 2016-08-21 ENCOUNTER — Encounter (HOSPITAL_COMMUNITY): Payer: Self-pay

## 2016-08-21 ENCOUNTER — Other Ambulatory Visit: Payer: Self-pay | Admitting: Family Medicine

## 2016-08-21 DIAGNOSIS — I2510 Atherosclerotic heart disease of native coronary artery without angina pectoris: Secondary | ICD-10-CM | POA: Diagnosis not present

## 2016-08-21 DIAGNOSIS — Z87891 Personal history of nicotine dependence: Secondary | ICD-10-CM | POA: Diagnosis not present

## 2016-08-21 DIAGNOSIS — D125 Benign neoplasm of sigmoid colon: Secondary | ICD-10-CM | POA: Diagnosis not present

## 2016-08-21 DIAGNOSIS — Z8542 Personal history of malignant neoplasm of other parts of uterus: Secondary | ICD-10-CM | POA: Diagnosis not present

## 2016-08-21 DIAGNOSIS — N189 Chronic kidney disease, unspecified: Secondary | ICD-10-CM | POA: Diagnosis not present

## 2016-08-21 DIAGNOSIS — E785 Hyperlipidemia, unspecified: Secondary | ICD-10-CM | POA: Diagnosis not present

## 2016-08-21 DIAGNOSIS — K219 Gastro-esophageal reflux disease without esophagitis: Secondary | ICD-10-CM | POA: Diagnosis not present

## 2016-08-21 DIAGNOSIS — Z8673 Personal history of transient ischemic attack (TIA), and cerebral infarction without residual deficits: Secondary | ICD-10-CM | POA: Diagnosis not present

## 2016-08-21 DIAGNOSIS — Z7982 Long term (current) use of aspirin: Secondary | ICD-10-CM | POA: Diagnosis not present

## 2016-08-21 DIAGNOSIS — E669 Obesity, unspecified: Secondary | ICD-10-CM | POA: Diagnosis not present

## 2016-08-21 DIAGNOSIS — Z6835 Body mass index (BMI) 35.0-35.9, adult: Secondary | ICD-10-CM | POA: Diagnosis not present

## 2016-08-21 DIAGNOSIS — E1122 Type 2 diabetes mellitus with diabetic chronic kidney disease: Secondary | ICD-10-CM | POA: Diagnosis not present

## 2016-08-21 DIAGNOSIS — M109 Gout, unspecified: Secondary | ICD-10-CM | POA: Diagnosis not present

## 2016-08-21 DIAGNOSIS — L409 Psoriasis, unspecified: Secondary | ICD-10-CM | POA: Diagnosis not present

## 2016-08-21 DIAGNOSIS — Z794 Long term (current) use of insulin: Secondary | ICD-10-CM | POA: Diagnosis not present

## 2016-08-21 DIAGNOSIS — K644 Residual hemorrhoidal skin tags: Secondary | ICD-10-CM | POA: Diagnosis not present

## 2016-08-21 DIAGNOSIS — K5730 Diverticulosis of large intestine without perforation or abscess without bleeding: Secondary | ICD-10-CM | POA: Diagnosis not present

## 2016-08-21 DIAGNOSIS — Z1211 Encounter for screening for malignant neoplasm of colon: Secondary | ICD-10-CM

## 2016-08-21 DIAGNOSIS — K6389 Other specified diseases of intestine: Secondary | ICD-10-CM | POA: Diagnosis not present

## 2016-08-21 DIAGNOSIS — G4730 Sleep apnea, unspecified: Secondary | ICD-10-CM | POA: Diagnosis not present

## 2016-08-21 DIAGNOSIS — Z79899 Other long term (current) drug therapy: Secondary | ICD-10-CM | POA: Diagnosis not present

## 2016-08-21 DIAGNOSIS — M1990 Unspecified osteoarthritis, unspecified site: Secondary | ICD-10-CM | POA: Diagnosis not present

## 2016-08-21 DIAGNOSIS — Z8614 Personal history of Methicillin resistant Staphylococcus aureus infection: Secondary | ICD-10-CM | POA: Diagnosis not present

## 2016-08-21 DIAGNOSIS — I129 Hypertensive chronic kidney disease with stage 1 through stage 4 chronic kidney disease, or unspecified chronic kidney disease: Secondary | ICD-10-CM | POA: Diagnosis not present

## 2016-08-21 HISTORY — DX: Unspecified osteoarthritis, unspecified site: M19.90

## 2016-08-21 HISTORY — DX: Malignant (primary) neoplasm, unspecified: C80.1

## 2016-08-21 NOTE — Patient Instructions (Signed)
Krista Strickland  08/21/2016     @PREFPERIOPPHARMACY @   Your procedure is scheduled on  08/24/2016   Report to [facility] at  900  A.M.  Call this number if you have problems the morning of surgery:  [phone]   Remember:  Do not eat food or drink liquids after midnight.  Take these medicines the morning of surgery with A SIP OF WATER  Allopurinol, flexaril, diltiazem, hydrocodone, metoprolol, midodrine. Take 1/2 of your usual insulin dosage the night before your surgery. DO NOT take any medications for diabetes the morning of your procedure.   Do not wear jewelry, make-up or nail polish.  Do not wear lotions, powders, or perfumes, or deoderant.  Do not shave 48 hours prior to surgery.  Men may shave face and neck.  Do not bring valuables to the hospital.  [facility] is not responsible for any belongings or valuables.  Contacts, dentures or bridgework may not be worn into surgery.  Leave your suitcase in the car.  After surgery it may be brought to your room.  For patients admitted to the hospital, discharge time will be determined by your treatment team.  Patients discharged the day of surgery will not be allowed to drive home.   Name and phone number of your driver:   family Special instructions:  Follow the diet and prep instructions given to you by Dr Olevia Perches office.  Please read over the following fact sheets that you were given. Anesthesia Post-op Instructions and Care and Recovery After Surgery       Colonoscopy, Adult A colonoscopy is an exam to look at the entire large intestine. During the exam, a lubricated, bendable tube is inserted into the anus and then passed into the rectum, colon, and other parts of the large intestine. A colonoscopy is often done as a part of normal colorectal screening or in response to certain symptoms, such as anemia, persistent diarrhea, abdominal pain, and blood in the stool. The exam can help screen for and diagnose  medical problems, including:  Tumors.  Polyps.  Inflammation.  Areas of bleeding.  Tell a health care provider about:  Any allergies you have.  All medicines you are taking, including vitamins, herbs, eye drops, creams, and over-the-counter medicines.  Any problems you or family members have had with anesthetic medicines.  Any blood disorders you have.  Any surgeries you have had.  Any medical conditions you have.  Any problems you have had passing stool. What are the risks? Generally, this is a safe procedure. However, problems may occur, including:  Bleeding.  A tear in the intestine.  A reaction to medicines given during the exam.  Infection (rare).  What happens before the procedure? Eating and drinking restrictions Follow instructions from your health care provider about eating and drinking, which may include:  A few days before the procedure - follow a low-fiber diet. Avoid nuts, seeds, dried fruit, raw fruits, and vegetables.  1-3 days before the procedure - follow a clear liquid diet. Drink only clear liquids, such as clear broth or bouillon, black coffee or tea, clear juice, clear soft drinks or sports drinks, gelatin dessert, and popsicles. Avoid any liquids that contain red or purple dye.  On the day of the procedure - do not eat or drink anything during the 2 hours before the procedure, or within the time period that your health care provider recommends.  Bowel prep If you  were prescribed an oral bowel prep to clean out your colon:  Take it as told by your health care provider. Starting the day before your procedure, you will need to drink a large amount of medicated liquid. The liquid will cause you to have multiple loose stools until your stool is almost clear or light green.  If your skin or anus gets irritated from diarrhea, you may use these to relieve the irritation: ? Medicated wipes, such as adult wet wipes with aloe and vitamin E. ? A skin  soothing-product like petroleum jelly.  If you vomit while drinking the bowel prep, take a break for up to 60 minutes and then begin the bowel prep again. If vomiting continues and you cannot take the bowel prep without vomiting, call your health care provider.  General instructions  Ask your health care provider about changing or stopping your regular medicines. This is especially important if you are taking diabetes medicines or blood thinners.  Plan to have someone take you home from the hospital or clinic. What happens during the procedure?  An IV tube may be inserted into one of your veins.  You will be given medicine to help you relax (sedative).  To reduce your risk of infection: ? Your health care team will wash or sanitize their hands. ? Your anal area will be washed with soap.  You will be asked to lie on your side with your knees bent.  Your health care provider will lubricate a long, thin, flexible tube. The tube will have a camera and a light on the end.  The tube will be inserted into your anus.  The tube will be gently eased through your rectum and colon.  Air will be delivered into your colon to keep it open. You may feel some pressure or cramping.  The camera will be used to take images during the procedure.  A small tissue sample may be removed from your body to be examined under a microscope (biopsy). If any potential problems are found, the tissue will be sent to a lab for testing.  If small polyps are found, your health care provider may remove them and have them checked for cancer cells.  The tube that was inserted into your anus will be slowly removed. The procedure may vary among health care providers and hospitals. What happens after the procedure?  Your blood pressure, heart rate, breathing rate, and blood oxygen level will be monitored until the medicines you were given have worn off.  Do not drive for 24 hours after the exam.  You may have a  small amount of blood in your stool.  You may pass gas and have mild abdominal cramping or bloating due to the air that was used to inflate your colon during the exam.  It is up to you to get the results of your procedure. Ask your health care provider, or the department performing the procedure, when your results will be ready. This information is not intended to replace advice given to you by your health care provider. Make sure you discuss any questions you have with your health care provider. Document Released: 01/13/2000 Document Revised: 11/16/2015 Document Reviewed: 03/29/2015 Elsevier Interactive Patient Education  2018 [employer].  Colonoscopy, Adult, Care After This sheet gives you information about how to care for yourself after your procedure. Your health care provider may also give you more specific instructions. If you have problems or questions, contact your health care provider. What can I expect  after the procedure? After the procedure, it is common to have:  A small amount of blood in your stool for 24 hours after the procedure.  Some gas.  Mild abdominal cramping or bloating.  Follow these instructions at home: General instructions   For the first 24 hours after the procedure: ? Do not drive or use machinery. ? Do not sign important documents. ? Do not drink alcohol. ? Do your regular daily activities at a slower pace than normal. ? Eat soft, easy-to-digest foods. ? Rest often.  Take over-the-counter or prescription medicines only as told by your health care provider.  It is up to you to get the results of your procedure. Ask your health care provider, or the department performing the procedure, when your results will be ready. Relieving cramping and bloating  Try walking around when you have cramps or feel bloated.  Apply heat to your abdomen as told by your health care provider. Use a heat source that your health care provider recommends, such as a moist  heat pack or a heating pad. ? Place a towel between your skin and the heat source. ? Leave the heat on for 20-30 minutes. ? Remove the heat if your skin turns bright red. This is especially important if you are unable to feel pain, heat, or cold. You may have a greater risk of getting burned. Eating and drinking  Drink enough fluid to keep your urine clear or pale yellow.  Resume your normal diet as instructed by your health care provider. Avoid heavy or fried foods that are hard to digest.  Avoid drinking alcohol for as long as instructed by your health care provider. Contact a health care provider if:  You have blood in your stool 2-3 days after the procedure. Get help right away if:  You have more than a small spotting of blood in your stool.  You pass large blood clots in your stool.  Your abdomen is swollen.  You have nausea or vomiting.  You have a fever.  You have increasing abdominal pain that is not relieved with medicine. This information is not intended to replace advice given to you by your health care provider. Make sure you discuss any questions you have with your health care provider. Document Released: 08/30/2003 Document Revised: 10/10/2015 Document Reviewed: 03/29/2015 Elsevier Interactive Patient Education  2018 [place] Anesthesia is a term that refers to techniques, procedures, and medicines that help a person stay safe and comfortable during a medical procedure. Monitored anesthesia care, or sedation, is one type of anesthesia. Your anesthesia specialist may recommend sedation if you will be having a procedure that does not require you to be unconscious, such as:  Cataract surgery.  A dental procedure.  A biopsy.  A colonoscopy.  During the procedure, you may receive a medicine to help you relax (sedative). There are three levels of sedation:  Mild sedation. At this level, you may feel awake and relaxed. You will be  able to follow directions.  Moderate sedation. At this level, you will be sleepy. You may not remember the procedure.  Deep sedation. At this level, you will be asleep. You will not remember the procedure.  The more medicine you are given, the deeper your level of sedation will be. Depending on how you respond to the procedure, the anesthesia specialist may change your level of sedation or the type of anesthesia to fit your needs. An anesthesia specialist will monitor you closely during  the procedure. Let your health care provider know about:  Any allergies you have.  All medicines you are taking, including vitamins, herbs, eye drops, creams, and over-the-counter medicines.  Any use of steroids (by mouth or as a cream).  Any problems you or family members have had with sedatives and anesthetic medicines.  Any blood disorders you have.  Any surgeries you have had.  Any medical conditions you have, such as sleep apnea.  Whether you are pregnant or may be pregnant.  Any use of cigarettes, alcohol, or street drugs. What are the risks? Generally, this is a safe procedure. However, problems may occur, including:  Getting too much medicine (oversedation).  Nausea.  Allergic reaction to medicines.  Trouble breathing. If this happens, a breathing tube may be used to help with breathing. It will be removed when you are awake and breathing on your own.  Heart trouble.  Lung trouble.  Before the procedure Staying hydrated Follow instructions from your health care provider about hydration, which may include:  Up to 2 hours before the procedure - you may continue to drink clear liquids, such as water, clear fruit juice, black coffee, and plain tea.  Eating and drinking restrictions Follow instructions from your health care provider about eating and drinking, which may include:  8 hours before the procedure - stop eating heavy meals or foods such as meat, fried foods, or fatty  foods.  6 hours before the procedure - stop eating light meals or foods, such as toast or cereal.  6 hours before the procedure - stop drinking milk or drinks that contain milk.  2 hours before the procedure - stop drinking clear liquids.  Medicines Ask your health care provider about:  Changing or stopping your regular medicines. This is especially important if you are taking diabetes medicines or blood thinners.  Taking medicines such as aspirin and ibuprofen. These medicines can thin your blood. Do not take these medicines before your procedure if your health care provider instructs you not to.  Tests and exams  You will have a physical exam.  You may have blood tests done to show: ? How well your kidneys and liver are working. ? How well your blood can clot.  General instructions  Plan to have someone take you home from the hospital or clinic.  If you will be going home right after the procedure, plan to have someone with you for 24 hours.  What happens during the procedure?  Your blood pressure, heart rate, breathing, level of pain and overall condition will be monitored.  An IV tube will be inserted into one of your veins.  Your anesthesia specialist will give you medicines as needed to keep you comfortable during the procedure. This may mean changing the level of sedation.  The procedure will be performed. After the procedure  Your blood pressure, heart rate, breathing rate, and blood oxygen level will be monitored until the medicines you were given have worn off.  Do not drive for 24 hours if you received a sedative.  You may: ? Feel sleepy, clumsy, or nauseous. ? Feel forgetful about what happened after the procedure. ? Have a sore throat if you had a breathing tube during the procedure. ? Vomit. This information is not intended to replace advice given to you by your health care provider. Make sure you discuss any questions you have with your health care  provider. Document Released: 10/11/2004 Document Revised: 06/24/2015 Document Reviewed: 05/08/2015 Elsevier Interactive Patient Education  2018 [place], Care After These instructions provide you with information about caring for yourself after your procedure. Your health care provider may also give you more specific instructions. Your treatment has been planned according to current medical practices, but problems sometimes occur. Call your health care provider if you have any problems or questions after your procedure. What can I expect after the procedure? After your procedure, it is common to:  Feel sleepy for several hours.  Feel clumsy and have poor balance for several hours.  Feel forgetful about what happened after the procedure.  Have poor judgment for several hours.  Feel nauseous or vomit.  Have a sore throat if you had a breathing tube during the procedure.  Follow these instructions at home: For at least 24 hours after the procedure:   Do not: ? Participate in activities in which you could fall or become injured. ? Drive. ? Use heavy machinery. ? Drink alcohol. ? Take sleeping pills or medicines that cause drowsiness. ? Make important decisions or sign legal documents. ? Take care of children on your own.  Rest. Eating and drinking  Follow the diet that is recommended by your health care provider.  If you vomit, drink water, juice, or soup when you can drink without vomiting.  Make sure you have little or no nausea before eating solid foods. General instructions  Have a responsible adult stay with you until you are awake and alert.  Take over-the-counter and prescription medicines only as told by your health care provider.  If you smoke, do not smoke without supervision.  Keep all follow-up visits as told by your health care provider. This is important. Contact a health care provider if:  You keep feeling nauseous or you  keep vomiting.  You feel light-headed.  You develop a rash.  You have a fever. Get help right away if:  You have trouble breathing. This information is not intended to replace advice given to you by your health care provider. Make sure you discuss any questions you have with your health care provider. Document Released: 05/08/2015 Document Revised: 09/07/2015 Document Reviewed: 05/08/2015 Elsevier Interactive Patient Education  [employer].

## 2016-08-24 ENCOUNTER — Ambulatory Visit (HOSPITAL_COMMUNITY): Payer: PPO | Admitting: Anesthesiology

## 2016-08-24 ENCOUNTER — Encounter (HOSPITAL_COMMUNITY)
Admission: RE | Disposition: A | Discharge status: Home / Self Care | Payer: Self-pay | Source: Ambulatory Visit | Attending: Internal Medicine

## 2016-08-24 ENCOUNTER — Ambulatory Visit (HOSPITAL_COMMUNITY)
Admission: RE | Admit: 2016-08-24 | Discharge: 2016-08-24 | Disposition: A | Discharge status: Home / Self Care | Payer: PPO | Source: Ambulatory Visit | Attending: Internal Medicine | Admitting: Internal Medicine

## 2016-08-24 ENCOUNTER — Encounter (HOSPITAL_COMMUNITY): Payer: Self-pay | Admitting: *Deleted

## 2016-08-24 DIAGNOSIS — Z8042 Family history of malignant neoplasm of prostate: Secondary | ICD-10-CM | POA: Insufficient documentation

## 2016-08-24 DIAGNOSIS — I129 Hypertensive chronic kidney disease with stage 1 through stage 4 chronic kidney disease, or unspecified chronic kidney disease: Secondary | ICD-10-CM | POA: Diagnosis not present

## 2016-08-24 DIAGNOSIS — E1122 Type 2 diabetes mellitus with diabetic chronic kidney disease: Secondary | ICD-10-CM | POA: Insufficient documentation

## 2016-08-24 DIAGNOSIS — D125 Benign neoplasm of sigmoid colon: Secondary | ICD-10-CM | POA: Diagnosis not present

## 2016-08-24 DIAGNOSIS — Z794 Long term (current) use of insulin: Secondary | ICD-10-CM | POA: Insufficient documentation

## 2016-08-24 DIAGNOSIS — L409 Psoriasis, unspecified: Secondary | ICD-10-CM | POA: Insufficient documentation

## 2016-08-24 DIAGNOSIS — Z9079 Acquired absence of other genital organ(s): Secondary | ICD-10-CM | POA: Insufficient documentation

## 2016-08-24 DIAGNOSIS — Z8614 Personal history of Methicillin resistant Staphylococcus aureus infection: Secondary | ICD-10-CM | POA: Diagnosis not present

## 2016-08-24 DIAGNOSIS — M109 Gout, unspecified: Secondary | ICD-10-CM | POA: Insufficient documentation

## 2016-08-24 DIAGNOSIS — E669 Obesity, unspecified: Secondary | ICD-10-CM | POA: Insufficient documentation

## 2016-08-24 DIAGNOSIS — N189 Chronic kidney disease, unspecified: Secondary | ICD-10-CM | POA: Diagnosis not present

## 2016-08-24 DIAGNOSIS — K5730 Diverticulosis of large intestine without perforation or abscess without bleeding: Secondary | ICD-10-CM | POA: Diagnosis not present

## 2016-08-24 DIAGNOSIS — K644 Residual hemorrhoidal skin tags: Secondary | ICD-10-CM | POA: Diagnosis not present

## 2016-08-24 DIAGNOSIS — Z880 Allergy status to penicillin: Secondary | ICD-10-CM | POA: Insufficient documentation

## 2016-08-24 DIAGNOSIS — K219 Gastro-esophageal reflux disease without esophagitis: Secondary | ICD-10-CM | POA: Insufficient documentation

## 2016-08-24 DIAGNOSIS — Z8673 Personal history of transient ischemic attack (TIA), and cerebral infarction without residual deficits: Secondary | ICD-10-CM | POA: Insufficient documentation

## 2016-08-24 DIAGNOSIS — K6389 Other specified diseases of intestine: Secondary | ICD-10-CM | POA: Insufficient documentation

## 2016-08-24 DIAGNOSIS — E785 Hyperlipidemia, unspecified: Secondary | ICD-10-CM | POA: Insufficient documentation

## 2016-08-24 DIAGNOSIS — Z7982 Long term (current) use of aspirin: Secondary | ICD-10-CM | POA: Insufficient documentation

## 2016-08-24 DIAGNOSIS — Z9889 Other specified postprocedural states: Secondary | ICD-10-CM | POA: Insufficient documentation

## 2016-08-24 DIAGNOSIS — Z9049 Acquired absence of other specified parts of digestive tract: Secondary | ICD-10-CM | POA: Insufficient documentation

## 2016-08-24 DIAGNOSIS — M1990 Unspecified osteoarthritis, unspecified site: Secondary | ICD-10-CM | POA: Insufficient documentation

## 2016-08-24 DIAGNOSIS — Z90710 Acquired absence of both cervix and uterus: Secondary | ICD-10-CM | POA: Insufficient documentation

## 2016-08-24 DIAGNOSIS — I2510 Atherosclerotic heart disease of native coronary artery without angina pectoris: Secondary | ICD-10-CM | POA: Insufficient documentation

## 2016-08-24 DIAGNOSIS — Z6835 Body mass index (BMI) 35.0-35.9, adult: Secondary | ICD-10-CM | POA: Insufficient documentation

## 2016-08-24 DIAGNOSIS — Z8249 Family history of ischemic heart disease and other diseases of the circulatory system: Secondary | ICD-10-CM | POA: Insufficient documentation

## 2016-08-24 DIAGNOSIS — Z1211 Encounter for screening for malignant neoplasm of colon: Secondary | ICD-10-CM

## 2016-08-24 DIAGNOSIS — Z8542 Personal history of malignant neoplasm of other parts of uterus: Secondary | ICD-10-CM | POA: Insufficient documentation

## 2016-08-24 DIAGNOSIS — Z800 Family history of malignant neoplasm of digestive organs: Secondary | ICD-10-CM | POA: Insufficient documentation

## 2016-08-24 DIAGNOSIS — Z79899 Other long term (current) drug therapy: Secondary | ICD-10-CM | POA: Insufficient documentation

## 2016-08-24 DIAGNOSIS — Z882 Allergy status to sulfonamides status: Secondary | ICD-10-CM | POA: Insufficient documentation

## 2016-08-24 DIAGNOSIS — Z888 Allergy status to other drugs, medicaments and biological substances status: Secondary | ICD-10-CM | POA: Insufficient documentation

## 2016-08-24 DIAGNOSIS — G4730 Sleep apnea, unspecified: Secondary | ICD-10-CM | POA: Insufficient documentation

## 2016-08-24 DIAGNOSIS — Z806 Family history of leukemia: Secondary | ICD-10-CM | POA: Insufficient documentation

## 2016-08-24 DIAGNOSIS — Z8349 Family history of other endocrine, nutritional and metabolic diseases: Secondary | ICD-10-CM | POA: Insufficient documentation

## 2016-08-24 DIAGNOSIS — Z87891 Personal history of nicotine dependence: Secondary | ICD-10-CM | POA: Insufficient documentation

## 2016-08-24 HISTORY — PX: POLYPECTOMY: SHX5525

## 2016-08-24 HISTORY — PX: COLONOSCOPY WITH PROPOFOL: SHX5780

## 2016-08-24 LAB — GLUCOSE, CAPILLARY
GLUCOSE-CAPILLARY: 124 mg/dL — AB (ref 65–99)
Glucose-Capillary: 124 mg/dL — ABNORMAL HIGH (ref 65–99)

## 2016-08-24 SURGERY — COLONOSCOPY WITH PROPOFOL
Anesthesia: Monitor Anesthesia Care

## 2016-08-24 MED ORDER — PROPOFOL 10 MG/ML IV BOLUS
INTRAVENOUS | Status: DC | PRN
  Administered 2016-08-24 (×3): 20 mg via INTRAVENOUS

## 2016-08-24 MED ORDER — FENTANYL CITRATE (PF) 100 MCG/2ML IJ SOLN
25.0000 ug | Freq: Once | INTRAMUSCULAR | Status: AC
  Administered 2016-08-24: 25 ug via INTRAVENOUS

## 2016-08-24 MED ORDER — MIDAZOLAM HCL 2 MG/2ML IJ SOLN
1.0000 mg | INTRAMUSCULAR | Status: AC
  Administered 2016-08-24: 2 mg via INTRAVENOUS

## 2016-08-24 MED ORDER — PROPOFOL 500 MG/50ML IV EMUL
INTRAVENOUS | Status: DC | PRN
  Administered 2016-08-24: 75 ug/kg/min via INTRAVENOUS

## 2016-08-24 MED ORDER — CHLORHEXIDINE GLUCONATE CLOTH 2 % EX PADS: 6.0000 | MEDICATED_PAD | Freq: Once | CUTANEOUS | Status: DC

## 2016-08-24 MED ORDER — PROPOFOL 10 MG/ML IV BOLUS
INTRAVENOUS | Status: AC
  Filled 2016-08-24: qty 40

## 2016-08-24 MED ORDER — FENTANYL CITRATE (PF) 100 MCG/2ML IJ SOLN
INTRAMUSCULAR | Status: AC
  Filled 2016-08-24: qty 2

## 2016-08-24 MED ORDER — LACTATED RINGERS IV SOLN
INTRAVENOUS | Status: DC
  Administered 2016-08-24: 10:00:00 via INTRAVENOUS

## 2016-08-24 MED ORDER — MIDAZOLAM HCL 2 MG/2ML IJ SOLN
INTRAMUSCULAR | Status: AC
  Filled 2016-08-24: qty 2

## 2016-08-24 NOTE — Discharge Instructions (Signed)
Resume aspirin on 08/25/2016. Resume other medications as before. High fiber diet. No driving for 24 hours. Physician will call with biopsy results.   Colonoscopy, Adult, Care After This sheet gives you information about how to care for yourself after your procedure. Your health care provider may also give you more specific instructions. If you have problems or questions, contact your health care provider. What can I expect after the procedure? After the procedure, it is common to have:  A small amount of blood in your stool for 24 hours after the procedure.  Some gas.  Mild abdominal cramping or bloating.  Follow these instructions at home: General instructions   For the first 24 hours after the procedure: ? Do not drive or use machinery. ? Do not sign important documents. ? Do not drink alcohol. ? Do your regular daily activities at a slower pace than normal. ? Eat soft, easy-to-digest foods. ? Rest often.  Take over-the-counter or prescription medicines only as told by your health care provider.  It is up to you to get the results of your procedure. Ask your health care provider, or the department performing the procedure, when your results will be ready. Relieving cramping and bloating  Try walking around when you have cramps or feel bloated.  Apply heat to your abdomen as told by your health care provider. Use a heat source that your health care provider recommends, such as a moist heat pack or a heating pad. ? Place a towel between your skin and the heat source. ? Leave the heat on for 20-30 minutes. ? Remove the heat if your skin turns bright red. This is especially important if you are unable to feel pain, heat, or cold. You may have a greater risk of getting burned. Eating and drinking  Drink enough fluid to keep your urine clear or pale yellow.  Resume your normal diet as instructed by your health care provider. Avoid heavy or fried foods that are hard to  digest.  Avoid drinking alcohol for as long as instructed by your health care provider. Contact a health care provider if:  You have blood in your stool 2-3 days after the procedure. Get help right away if:  You have more than a small spotting of blood in your stool.  You pass large blood clots in your stool.  Your abdomen is swollen.  You have nausea or vomiting.  You have a fever.  You have increasing abdominal pain that is not relieved with medicine. This information is not intended to replace advice given to you by your health care provider. Make sure you discuss any questions you have with your health care provider. Document Released: 08/30/2003 Document Revised: 10/10/2015 Document Reviewed: 03/29/2015 Elsevier Interactive Patient Education  2018 [employer].   Colon Polyps Polyps are tissue growths inside the body. Polyps can grow in many places, including the large intestine (colon). A polyp may be a round bump or a mushroom-shaped growth. You could have one polyp or several. Most colon polyps are noncancerous (benign). However, some colon polyps can become cancerous over time. What are the causes? The exact cause of colon polyps is not known. What increases the risk? This condition is more likely to develop in people who:  Have a family history of colon cancer or colon polyps.  Are older than 27 or older than 45 if they are African American.  Have inflammatory bowel disease, such as ulcerative colitis or Crohn disease.  Are overweight.  Smoke cigarettes.  Do not get enough exercise.  Drink too much alcohol.  Eat a diet that is: ? High in fat and red meat. ? Low in fiber.  Had childhood cancer that was treated with abdominal radiation.  What are the signs or symptoms? Most polyps do not cause symptoms. If you have symptoms, they may include:  Blood coming from your rectum when having a bowel movement.  Blood in your stool.The stool may look dark red  or black.  A change in bowel habits, such as constipation or diarrhea.  How is this diagnosed? This condition is diagnosed with a colonoscopy. This is a procedure that uses a lighted, flexible scope to look at the inside of your colon. How is this treated? Treatment for this condition involves removing any polyps that are found. Those polyps will then be tested for cancer. If cancer is found, your health care provider will talk to you about options for colon cancer treatment. Follow these instructions at home: Diet  Eat plenty of fiber, such as fruits, vegetables, and whole grains.  Eat foods that are high in calcium and vitamin D, such as milk, cheese, yogurt, eggs, liver, fish, and broccoli.  Limit foods high in fat, red meats, and processed meats, such as hot dogs, sausage, bacon, and lunch meats.  Maintain a healthy weight, or lose weight if recommended by your health care provider. General instructions  Do not smoke cigarettes.  Do not drink alcohol excessively.  Keep all follow-up visits as told by your health care provider. This is important. This includes keeping regularly scheduled colonoscopies. Talk to your health care provider about when you need a colonoscopy.  Exercise every day or as told by your health care provider. Contact a health care provider if:  You have new or worsening bleeding during a bowel movement.  You have new or increased blood in your stool.  You have a change in bowel habits.  You unexpectedly lose weight. This information is not intended to replace advice given to you by your health care provider. Make sure you discuss any questions you have with your health care provider. Document Released: 10/12/2003 Document Revised: 06/23/2015 Document Reviewed: 12/06/2014 Elsevier Interactive Patient Education  [employer].

## 2016-08-24 NOTE — Anesthesia Preprocedure Evaluation (Signed)
Anesthesia Evaluation  Patient identified by MRN, date of birth, ID band Patient awake    Reviewed: Allergy & Precautions, H&P , NPO status , Patient's Chart, lab work & pertinent test results  Airway Mallampati: II  TM Distance: >3 FB     Dental  (+) Teeth Intact   Pulmonary sleep apnea , former smoker,    breath sounds clear to auscultation       Cardiovascular hypertension, Pt. on medications + CAD   Rhythm:Regular Rate:Normal     Neuro/Psych CVA    GI/Hepatic GERD  Medicated and Controlled,  Endo/Other  diabetes, Type 2, Insulin Dependent  Renal/GU Renal InsufficiencyRenal disease     Musculoskeletal   Abdominal   Peds  Hematology   Anesthesia Other Findings   Reproductive/Obstetrics                             Anesthesia Physical Anesthesia Plan  ASA: III  Anesthesia Plan: MAC   Post-op Pain Management:    Induction: Intravenous  PONV Risk Score and Plan:   Airway Management Planned: Simple Face Mask  Additional Equipment:   Intra-op Plan:   Post-operative Plan:   Informed Consent: I have reviewed the patients History and Physical, chart, labs and discussed the procedure including the risks, benefits and alternatives for the proposed anesthesia with the patient or authorized representative who has indicated his/her understanding and acceptance.     Plan Discussed with:   Anesthesia Plan Comments:         Anesthesia Quick Evaluation

## 2016-08-24 NOTE — Anesthesia Procedure Notes (Signed)
Procedure Name: MAC Date/Time: 08/24/2016 10:13 AM Performed by: Vista Deck Pre-anesthesia Checklist: Patient identified, Emergency Drugs available, Suction available, Timeout performed and Patient being monitored Patient Re-evaluated:Patient Re-evaluated prior to induction Oxygen Delivery Method: Non-rebreather mask

## 2016-08-24 NOTE — H&P (Signed)
Krista Strickland is an 66 y.o. female.   Chief Complaint: Patient is here for colonoscopy. HPI: Patient is 66 year old African-American female with multiple medical problems was here for screening colonoscopy. She denies abdominal pain change in bowel habits or rectal bleeding. Last colonoscopy was about 10 years ago and was normal. Family history is negative for CRC. Last aspirin dose was 2 days ago.  Past Medical History:  Diagnosis Date  . Arthritis   . Cancer ([place])    uterine  . Chronic kidney disease    Mild MR noted on echo of February 2004.    . Diabetes mellitus, type 2 ([place])   . GERD (gastroesophageal reflux disease)    no medication in 2017  . Gout   . History of MRSA infection 03/2009   pt denies this  . Hypercalcemia 2017   managed by nephrology  . Hyperlipidemia   . Hypertension   . Obesity   . Psoriasis   . Stroke [facility] 2013   "light"    Past Surgical History:  Procedure Laterality Date  . ABDOMINAL HYSTERECTOMY    . APPENDECTOMY  2012  . excison of rt breast cyst    . LAPAROSCOPIC SALPINGO OOPHERECTOMY Right 04/22/2012   Procedure: LAPAROSCOPIC SALPINGO OOPHORECTOMY;  Surgeon: Jonnie Kind, MD;  Location: [facility];  Service: Gynecology;  Laterality: Right;  end 11:17  . MASS EXCISION N/A 04/22/2012   Procedure: EXCISION SKIN TAGS NECK AND HEAD;  Surgeon: Jonnie Kind, MD;  Location: [facility];  Service: Gynecology;  Laterality: N/A;  start 11:19  . PARTIAL HYSTERECTOMY    . rt,. neck biopsy      Family History  Problem Relation Age of Onset  . Hypertension Brother   . Gout Brother   . Prostate cancer Brother   . Hypertension Brother   . Prostate cancer Brother   . Gout Brother   . Hypertension Sister   . Gout Sister   . Cancer Sister 20       pancreatic   . Leukemia Sister 79  . Gout Sister   . Prostate cancer Brother   . Diabetes Neg Hx    Social History:  reports that she quit smoking about 38 years ago. She has a 3.00 pack-year smoking  history. She has never used smokeless tobacco. She reports that she does not drink alcohol or use drugs.  Allergies:  Allergies  Allergen Reactions  . Benazepril Swelling  . Metronidazole Hives  . Mobic [Meloxicam] Hives  . Penicillins Hives and Swelling    Has patient had a PCN reaction causing immediate rash, facial/tongue/throat swelling, SOB or lightheadedness with hypotension: No Has patient had a PCN reaction causing severe rash involving mucus membranes or skin necrosis: Yes Has patient had a PCN reaction that required hospitalization: No Has patient had a PCN reaction occurring within the last 10 years: No If all of the above answers are "NO", then may proceed with Cephalosporin use.   . Sulfonamide Derivatives Hives    Medications Prior to Admission  Medication Sig Dispense Refill  . allopurinol (ZYLOPRIM) 300 MG tablet TAKE ONE TABLET BY MOUTH ONCE DAILY 30 tablet 5  . aspirin 325 MG tablet Take 325 mg by mouth daily.    . calcitRIOL (ROCALTROL) 0.25 MCG capsule Take 0.25 mcg by mouth daily.    . cyclobenzaprine (FLEXERIL) 10 MG tablet Take 10 mg by mouth 3 (three) times daily as needed for muscle spasms.     Marland Kitchen diltiazem (DILACOR  XR) 180 MG 24 hr capsule TAKE 1 CAPSULE BY MOUTH ONCE DAILY 90 capsule 1  . docusate sodium (COLACE) 100 MG capsule Take 100 mg by mouth 2 (two) times daily. Constipation    . HYDROcodone-acetaminophen (NORCO) 7.5-325 MG per tablet Take 1 tablet by mouth 3 (three) times daily as needed for moderate pain.     Marland Kitchen insulin NPH-regular Human (NOVOLIN 70/30) (70-30) 100 UNIT/ML injection Inject 170 Units into the skin daily with breakfast. (Patient taking differently: Inject 180 Units into the skin daily with breakfast. ) 90 mL 11  . metolazone (ZAROXOLYN) 5 MG tablet TAKE 1 TABLET BY MOUTH ONCE DAILY 90 tablet 1  . metoprolol tartrate (LOPRESSOR) 100 MG tablet TAKE 1 TABLET BY MOUTH TWICE DAILY 180 tablet 1  . midodrine (PROAMATINE) 5 MG tablet Take 5 mg by  mouth 2 (two) times daily.     Marland Kitchen NOVOLIN R RELION 100 UNIT/ML injection INJECT 30 UNITS INTO THE SKIN DAILY WITH SUPPER 10 mL 4  . potassium chloride (K-DUR) 10 MEQ tablet TAKE THREE TABLETS BY MOUTH THREE TIMES DAILY 270 tablet 5  . pravastatin (PRAVACHOL) 20 MG tablet TAKE ONE TABLET BY MOUTH ONCE DAILY WITH BREAKFAST (Patient taking differently: TAKE ONE TABLET BY MOUTH ONCE DAILY IN THE EVENING) 90 tablet 1  . spironolactone (ALDACTONE) 25 MG tablet TAKE ONE TABLET BY MOUTH ONCE DAILY 90 tablet 1  . torsemide (DEMADEX) 20 MG tablet Take 80 mg by mouth daily.     Marland Kitchen ezetimibe (ZETIA) 10 MG tablet TAKE ONE TABLET BY MOUTH ONCE DAILY 90 tablet 1  . ONE TOUCH ULTRA TEST test strip USE ONE STRIP TO CHECK GLUCOSE TWICE DAILY 100 each 11  . ONETOUCH DELICA LANCETS 70J MISC 1 Device by Does not apply route 2 (two) times daily. 60 each 11  . RELION INSULIN SYR 1CC/30G 30G X 5/16" 1 ML MISC USE ONE SYRINGE TO INJECT INSULIN SUBCUTANEOUSLY THREE TIMES DAILY AS DIRECTED 150 each 0  . RELION INSULIN SYRINGE 31G X 15/64" 1 ML MISC USE ONE SYRINGE THREE TIMES DAILY  AS DIRECTED. 150 each 0    Results for orders placed or performed during the hospital encounter of 08/24/16 (from the past 48 hour(s))  Glucose, capillary     Status: Abnormal   Collection Time: 08/24/16  9:29 AM  Result Value Ref Range   Glucose-Capillary 124 (H) 65 - 99 mg/dL   No results found.  ROS  There were no vitals taken for this visit. Physical Exam  Constitutional: She appears well-developed and well-nourished.  HENT:  Mouth/Throat: Oropharynx is clear and moist.  Eyes: Conjunctivae are normal. No scleral icterus.  Neck: No thyromegaly present.  Cardiovascular: Normal rate and regular rhythm.   Murmur (grade 2/6 systolic ejection murmur best heard at left sternal border) heard. Respiratory: Effort normal and breath sounds normal.  GI:  Abdomen is symmetrical. She has left paramedian and lower midline scar. No organomegaly  or masses.  Musculoskeletal: She exhibits no edema.  Lymphadenopathy:    She has no cervical adenopathy.  Neurological: She is alert.  Skin: Skin is warm and dry.     Assessment/Plan Average risk screening colonoscopy under monitored anesthesia care.  Hildred Laser, MD 08/24/2016, 9:34 AM

## 2016-08-24 NOTE — Transfer of Care (Signed)
Immediate Anesthesia Transfer of Care Note  Patient: Krista Strickland  Procedure(s) Performed: Procedure(s) with comments: COLONOSCOPY WITH PROPOFOL (N/A) - 10:30 POLYPECTOMY - colon  Patient Location: PACU  Anesthesia Type:MAC  Level of Consciousness: awake and patient cooperative  Airway & Oxygen Therapy: Patient Spontanous Breathing and Patient connected to face mask oxygen  Post-op Assessment: Report given to RN, Post -op Vital signs reviewed and stable and Patient moving all extremities  Post vital signs: Reviewed and stable  Last Vitals:  Vitals:   08/24/16 0943  BP: 129/66  Pulse: 67  Resp: 12  Temp: 36.9 C    Last Pain:  Vitals:   08/24/16 0943  TempSrc: Oral      Patients Stated Pain Goal: 9 (27/74/12 8786)  Complications: No apparent anesthesia complications

## 2016-08-24 NOTE — Anesthesia Postprocedure Evaluation (Signed)
Anesthesia Post Note  Patient: Krista Strickland  Procedure(s) Performed: Procedure(s) (LRB): COLONOSCOPY WITH PROPOFOL (N/A) POLYPECTOMY  Patient location during evaluation: PACU Anesthesia Type: MAC Level of consciousness: awake and alert and patient cooperative Pain management: pain level controlled Vital Signs Assessment: post-procedure vital signs reviewed and stable Respiratory status: spontaneous breathing, nonlabored ventilation and respiratory function stable Cardiovascular status: blood pressure returned to baseline Postop Assessment: no signs of nausea or vomiting Anesthetic complications: no     Last Vitals:  Vitals:   08/24/16 0943  BP: 129/66  Pulse: 67  Resp: 12  Temp: 36.9 C    Last Pain:  Vitals:   08/24/16 0943  TempSrc: Oral                 Leahna Hewson J

## 2016-08-24 NOTE — Op Note (Signed)
[facility] Patient Name: Krista Strickland Procedure Date: 08/24/2016 10:10 AM MRN: [id number] Date of Birth: 10/02/1950 Attending MD: Hildred Laser , MD CSN: [id number] Age: 66 Admit Type: Outpatient Procedure:                Colonoscopy Indications:              Screening for colorectal malignant neoplasm Providers:                Hildred Laser, MD, Otis Peak B. Sharon Seller, RN, Randa Spike, Technician Referring MD:             Norwood Levo. Moshe Cipro, MD Medicines:                Propofol per Anesthesia Complications:            No immediate complications. Estimated Blood Loss:     Estimated blood loss was minimal. Procedure:                Pre-Anesthesia Assessment:                           - Prior to the procedure, a History and Physical                            was performed, and patient medications and                            allergies were reviewed. The patient's tolerance of                            previous anesthesia was also reviewed. The risks                            and benefits of the procedure and the sedation                            options and risks were discussed with the patient.                            All questions were answered, and informed consent                            was obtained. Prior Anticoagulants: The patient                            last took aspirin 2 days prior to the procedure.                            ASA Grade Assessment: III - A patient with severe                            systemic disease. After reviewing the risks and  benefits, the patient was deemed in satisfactory                            condition to undergo the procedure.                           After obtaining informed consent, the colonoscope                            was passed under direct vision. Throughout the                            procedure, the patient's blood pressure, pulse, and               oxygen saturations were monitored continuously. The                            EC-[id number] ([id number]) scope was introduced through                            the and advanced to the the cecum, identified by                            appendiceal orifice and ileocecal valve. The                            colonoscopy was performed without difficulty. The                            patient tolerated the procedure well. The quality                            of the bowel preparation was good. The terminal                            ileum, ileocecal valve, appendiceal orifice, and                            rectum were photographed. Scope In: 10:22:46 AM Scope Out: 10:44:04 AM Scope Withdrawal Time: 0 hours 11 minutes 35 seconds  Total Procedure Duration: 0 hours 21 minutes 18 seconds  Findings:      The perianal and digital rectal examinations were normal.      A diffuse area of mild melanosis was found in the entire colon.      Multiple medium-mouthed diverticula were found in the sigmoid colon and       hepatic flexure.      A 3 mm polyp was found in the distal sigmoid colon. The polyp was       removed with a cold snare. Resection and retrieval were complete.      External hemorrhoids were found during retroflexion. The hemorrhoids       were small. Impression:               - Melanosis in the colon.                           -  Diverticulosis in the sigmoid colon and at the                            hepatic flexure.                           - One 3 mm polyp in the distal sigmoid colon,                            removed with a cold snare. Resected and retrieved.                           - External hemorrhoids. Moderate Sedation:      Per Anesthesia Care Recommendation:           - Patient has a contact number available for                            emergencies. The signs and symptoms of potential                            delayed complications were discussed with the                             patient. Return to normal activities tomorrow.                            Written discharge instructions were provided to the                            patient.                           - High fiber diet today.                           - Continue present medications.                           - Resume aspirin at prior dose tomorrow.                           - Await pathology results.                           - Repeat colonoscopy date to be determined after                            pending pathology results are reviewed. Procedure Code(s):        --- Professional ---                           [phone], Colonoscopy, flexible; with removal of                            tumor(s), polyp(s), or other lesion(s) by snare  technique Diagnosis Code(s):        --- Professional ---                           Z12.11, Encounter for screening for malignant                            neoplasm of colon                           K63.89, Other specified diseases of intestine                           K64.4, Residual hemorrhoidal skin tags                           D12.5, Benign neoplasm of sigmoid colon                           K57.30, Diverticulosis of large intestine without                            perforation or abscess without bleeding CPT copyright 2016 American Medical Association. All rights reserved. The codes documented in this report are preliminary and upon coder review may  be revised to meet current compliance requirements. Hildred Laser, MD Hildred Laser, MD 08/24/2016 10:53:16 AM This report has been signed electronically. Number of Addenda: 0

## 2016-08-27 ENCOUNTER — Encounter (HOSPITAL_COMMUNITY): Payer: Self-pay | Admitting: Internal Medicine

## 2016-09-04 ENCOUNTER — Other Ambulatory Visit: Payer: Self-pay | Admitting: Family Medicine

## 2016-09-07 DIAGNOSIS — R809 Proteinuria, unspecified: Secondary | ICD-10-CM | POA: Diagnosis not present

## 2016-09-07 DIAGNOSIS — M4310 Spondylolisthesis, site unspecified: Secondary | ICD-10-CM | POA: Diagnosis not present

## 2016-09-07 DIAGNOSIS — M5416 Radiculopathy, lumbar region: Secondary | ICD-10-CM | POA: Diagnosis not present

## 2016-09-07 DIAGNOSIS — D509 Iron deficiency anemia, unspecified: Secondary | ICD-10-CM | POA: Diagnosis not present

## 2016-09-07 DIAGNOSIS — N183 Chronic kidney disease, stage 3 (moderate): Secondary | ICD-10-CM | POA: Diagnosis not present

## 2016-09-07 DIAGNOSIS — I10 Essential (primary) hypertension: Secondary | ICD-10-CM | POA: Diagnosis not present

## 2016-09-07 DIAGNOSIS — E559 Vitamin D deficiency, unspecified: Secondary | ICD-10-CM | POA: Diagnosis not present

## 2016-09-07 DIAGNOSIS — Z79899 Other long term (current) drug therapy: Secondary | ICD-10-CM | POA: Diagnosis not present

## 2016-09-07 DIAGNOSIS — M48062 Spinal stenosis, lumbar region with neurogenic claudication: Secondary | ICD-10-CM | POA: Diagnosis not present

## 2016-09-11 DIAGNOSIS — E1129 Type 2 diabetes mellitus with other diabetic kidney complication: Secondary | ICD-10-CM | POA: Diagnosis not present

## 2016-09-11 DIAGNOSIS — I10 Essential (primary) hypertension: Secondary | ICD-10-CM | POA: Diagnosis not present

## 2016-09-11 DIAGNOSIS — I509 Heart failure, unspecified: Secondary | ICD-10-CM | POA: Diagnosis not present

## 2016-09-11 DIAGNOSIS — R809 Proteinuria, unspecified: Secondary | ICD-10-CM | POA: Diagnosis not present

## 2016-09-11 DIAGNOSIS — E559 Vitamin D deficiency, unspecified: Secondary | ICD-10-CM | POA: Diagnosis not present

## 2016-09-11 DIAGNOSIS — N184 Chronic kidney disease, stage 4 (severe): Secondary | ICD-10-CM | POA: Diagnosis not present

## 2016-09-19 ENCOUNTER — Telehealth: Payer: Self-pay | Admitting: Orthopaedic Surgery

## 2016-09-26 ENCOUNTER — Other Ambulatory Visit: Payer: Self-pay | Admitting: Endocrinology

## 2016-10-09 ENCOUNTER — Other Ambulatory Visit: Payer: Self-pay | Admitting: Family Medicine

## 2016-10-09 NOTE — Telephone Encounter (Signed)
Seen [phone]

## 2016-11-02 DIAGNOSIS — E790 Hyperuricemia without signs of inflammatory arthritis and tophaceous disease: Secondary | ICD-10-CM | POA: Diagnosis not present

## 2016-11-02 DIAGNOSIS — I10 Essential (primary) hypertension: Secondary | ICD-10-CM | POA: Diagnosis not present

## 2016-11-02 DIAGNOSIS — E785 Hyperlipidemia, unspecified: Secondary | ICD-10-CM | POA: Diagnosis not present

## 2016-11-02 LAB — COMPLETE METABOLIC PANEL WITH GFR
AG RATIO: 1.5 (calc) (ref 1.0–2.5)
ALKALINE PHOSPHATASE (APISO): 63 U/L (ref 33–130)
ALT: 22 U/L (ref 6–29)
AST: 19 U/L (ref 10–35)
Albumin: 3.9 g/dL (ref 3.6–5.1)
BUN/Creatinine Ratio: 38 (calc) — ABNORMAL HIGH (ref 6–22)
BUN: 83 mg/dL — ABNORMAL HIGH (ref 7–25)
CHLORIDE: 97 mmol/L — AB (ref 98–110)
CO2: 32 mmol/L (ref 20–32)
Calcium: 9.8 mg/dL (ref 8.6–10.4)
Creat: 2.19 mg/dL — ABNORMAL HIGH (ref 0.50–0.99)
GFR, Est African American: 26 mL/min/{1.73_m2} — ABNORMAL LOW (ref >=60)
GFR, Est Non African American: 23 mL/min/{1.73_m2} — ABNORMAL LOW (ref >=60)
GLOBULIN: 2.6 g/dL (ref 1.9–3.7)
Glucose, Bld: 95 mg/dL (ref 65–99)
POTASSIUM: 3.2 mmol/L — AB (ref 3.5–5.3)
SODIUM: 140 mmol/L (ref 135–146)
Total Bilirubin: 0.5 mg/dL (ref 0.2–1.2)
Total Protein: 6.5 g/dL (ref 6.1–8.1)

## 2016-11-02 LAB — LIPID PANEL
CHOLESTEROL: 111 mg/dL (ref <200)
HDL: 31 mg/dL — ABNORMAL LOW (ref >50)
LDL Cholesterol (Calc): 57 mg/dL (calc)
Non-HDL Cholesterol (Calc): 80 mg/dL (calc) (ref <130)
Total CHOL/HDL Ratio: 3.6 (calc) (ref <5.0)
Triglycerides: 155 mg/dL — ABNORMAL HIGH (ref <150)

## 2016-11-02 LAB — URIC ACID: URIC ACID, SERUM: 6.5 mg/dL (ref 2.5–7.0)

## 2016-11-05 ENCOUNTER — Ambulatory Visit (INDEPENDENT_AMBULATORY_CARE_PROVIDER_SITE_OTHER): Payer: PPO | Admitting: Family Medicine

## 2016-11-05 ENCOUNTER — Encounter: Payer: Self-pay | Admitting: Family Medicine

## 2016-11-05 VITALS — BP 118/62 | HR 72 | Temp 98.1°F | Resp 16 | Ht 63.0 in | Wt 200.0 lb

## 2016-11-05 DIAGNOSIS — M205X2 Other deformities of toe(s) (acquired), left foot: Secondary | ICD-10-CM | POA: Diagnosis not present

## 2016-11-05 DIAGNOSIS — E785 Hyperlipidemia, unspecified: Secondary | ICD-10-CM | POA: Diagnosis not present

## 2016-11-05 DIAGNOSIS — E1140 Type 2 diabetes mellitus with diabetic neuropathy, unspecified: Secondary | ICD-10-CM | POA: Diagnosis not present

## 2016-11-05 DIAGNOSIS — Z794 Long term (current) use of insulin: Secondary | ICD-10-CM

## 2016-11-05 DIAGNOSIS — I10 Essential (primary) hypertension: Secondary | ICD-10-CM

## 2016-11-05 DIAGNOSIS — E119 Type 2 diabetes mellitus without complications: Secondary | ICD-10-CM | POA: Diagnosis not present

## 2016-11-05 DIAGNOSIS — Z23 Encounter for immunization: Secondary | ICD-10-CM

## 2016-11-05 DIAGNOSIS — IMO0001 Reserved for inherently not codable concepts without codable children: Secondary | ICD-10-CM

## 2016-11-05 DIAGNOSIS — M79675 Pain in left toe(s): Secondary | ICD-10-CM | POA: Diagnosis not present

## 2016-11-05 DIAGNOSIS — M2042 Other hammer toe(s) (acquired), left foot: Secondary | ICD-10-CM | POA: Diagnosis not present

## 2016-11-05 NOTE — Patient Instructions (Signed)
Annual physical exam in January, call if you need me sooner  Flu vaccine today  Please eat foods rich in potassium this is slightly low  Cholesterol is MUCH better , keep up the great work!  It is important that you exercise regularly at least 30 minutes 5 times a week. If you develop chest pain, have severe difficulty breathing, or feel very tired, stop exercising immediately and seek medical attention    Thank you  for choosing [facility]. We consider it a privelige to serve you.  Delivering excellent health care in a caring and  compassionate way is our goal.  Partnering with you,  so that together we can achieve this goal is our strategy.

## 2016-11-05 NOTE — Assessment & Plan Note (Addendum)
Managed by endo, aduequately controlled Krista Strickland is reminded of the importance of commitment to daily physical activity for 30 minutes or more, as able and the need to limit carbohydrate intake to 30 to 60 grams per meal to help with blood sugar control.   The need to take medication as prescribed, test blood sugar as directed, and to call between visits if there is a concern that blood sugar is uncontrolled is also discussed.   Krista Strickland is reminded of the importance of daily foot exam, annual eye examination, and good blood sugar, blood pressure and cholesterol control.  Diabetic Labs Latest Ref Rng & Units 11/02/2016 08/13/2016 06/01/2016 04/12/2016 01/25/2016  HbA1c - - 7.7 - 7.5 -  Microalbumin Not Estab. ug/mL - - - - <3.0(H)  Micro/Creat Ratio 0.0 - 30.0 mg/g creat - - - - <9.9  Chol <200 mg/dL 111 - 122 - -  HDL >50 mg/dL 31(L) - 22(L) - -  Calc LDL <100 mg/dL - - 45 - -  Triglycerides <150 mg/dL 155(H) - 275(H) - -  Creatinine 0.50 - 0.99 mg/dL 2.19(H) - 2.03(H) - -   BP/Weight 11/05/2016 08/24/2016 08/21/2016 08/13/2016 06/05/2016 04/12/2016 1/62/4469  Systolic BP 507 225 750 518 335 825 189  Diastolic BP 62 59 90 72 70 70 76  Wt. (Lbs) 200 - 202 200 201 201 201.08  BMI 35.43 - 35.78 35.43 35.61 35.61 35.62   Foot/eye exam completion dates Latest Ref Rng & Units 04/12/2016 01/25/2016  Eye Exam No Retinopathy - -  Foot exam Order - - -  Foot Form Completion - Done Done      Refer for eye exam which is past due

## 2016-11-06 ENCOUNTER — Other Ambulatory Visit: Payer: Self-pay | Admitting: Endocrinology

## 2016-11-10 NOTE — Assessment & Plan Note (Signed)
Controlled, no change in medication DASH diet and commitment to daily physical activity for a minimum of 30 minutes discussed and encouraged, as a part of hypertension management. The importance of attaining a healthy weight is also discussed.  BP/Weight 11/05/2016 08/24/2016 08/21/2016 08/13/2016 06/05/2016 04/12/2016 05/10/9045  Systolic BP 533 917 921 783 754 237 023  Diastolic BP 62 59 90 72 70 70 76  Wt. (Lbs) 200 - 202 200 201 201 201.08  BMI 35.43 - 35.78 35.43 35.61 35.61 35.62

## 2016-11-10 NOTE — Assessment & Plan Note (Signed)
Improved Hyperlipidemia:Low fat diet discussed and encouraged.   Lipid Panel  Lab Results  Component Value Date   CHOL 111 11/02/2016   HDL 31 (L) 11/02/2016   LDLCALC 45 06/01/2016   TRIG 155 (H) 11/02/2016   CHOLHDL 3.6 11/02/2016   Needs to increase exercise

## 2016-11-10 NOTE — Progress Notes (Signed)
Krista Strickland     MRN: [id number]      DOB: 09-Dec-1950   HPI Krista Strickland is here for follow up and re-evaluation of chronic medical conditions, medication management and review of any available recent lab and radiology data.  Preventive health is updated, specifically  Cancer screening and Immunization.   Questions or concerns regarding consultations or procedures which the PT has had in the interim are  addressed. The PT denies any adverse reactions to current medications since the last visit.  There are no new concerns.  There are no specific complaints   ROS Denies recent fever or chills. Denies sinus pressure, nasal congestion, ear pain or sore throat. Denies chest congestion, productive cough or wheezing. Denies chest pains, palpitations and leg swelling Denies abdominal pain, nausea, vomiting,diarrhea or constipation.   Denies dysuria, frequency, hesitancy or incontinence. Denies joint pain, swelling and limitation in mobility. Denies headaches, seizures, numbness, or tingling. Denies depression, anxiety or insomnia. Denies skin break down or rash.   PE  BP 118/62 (BP Location: Left Arm, Patient Position: Sitting, Cuff Size: Normal)   Pulse 72   Temp 98.1 F (36.7 C) (Other (Comment))   Resp 16   Ht 5\' 3"  (1.6 m)   Wt 200 lb (90.7 kg)   SpO2 (!) 72%   BMI 35.43 kg/m   Patient alert and oriented and in no cardiopulmonary distress.  HEENT: No facial asymmetry, EOMI,   oropharynx pink and moist.  Neck supple no JVD, no mass.  Chest: Clear to auscultation bilaterally.  CVS: S1, S2 no murmurs, no S3.Regular rate.  ABD: Soft non tender.   Ext: No edema  MS: Adequate ROM spine, shoulders, hips and knees.  Skin: Intact, no ulcerations or rash noted.  Psych: Good eye contact, normal affect. Memory intact not anxious or depressed appearing.  CNS: CN 2-12 intact, power,  normal throughout.no focal deficits noted.   Assessment & Plan  Diabetes mellitus,  insulin dependent (IDDM), controlled ([place]) Managed by endo, aduequately controlled Krista Strickland is reminded of the importance of commitment to daily physical activity for 30 minutes or more, as able and the need to limit carbohydrate intake to 30 to 60 grams per meal to help with blood sugar control.   The need to take medication as prescribed, test blood sugar as directed, and to call between visits if there is a concern that blood sugar is uncontrolled is also discussed.   Krista Strickland is reminded of the importance of daily foot exam, annual eye examination, and good blood sugar, blood pressure and cholesterol control.  Diabetic Labs Latest Ref Rng & Units 11/02/2016 08/13/2016 06/01/2016 04/12/2016 01/25/2016  HbA1c - - 7.7 - 7.5 -  Microalbumin Not Estab. ug/mL - - - - <3.0(H)  Micro/Creat Ratio 0.0 - 30.0 mg/g creat - - - - <9.9  Chol <200 mg/dL 111 - 122 - -  HDL >50 mg/dL 31(L) - 22(L) - -  Calc LDL <100 mg/dL - - 45 - -  Triglycerides <150 mg/dL 155(H) - 275(H) - -  Creatinine 0.50 - 0.99 mg/dL 2.19(H) - 2.03(H) - -   BP/Weight 11/05/2016 08/24/2016 08/21/2016 08/13/2016 06/05/2016 04/12/2016 4/81/8563  Systolic BP 149 702 637 858 850 277 412  Diastolic BP 62 59 90 72 70 70 76  Wt. (Lbs) 200 - 202 200 201 201 201.08  BMI 35.43 - 35.78 35.43 35.61 35.61 35.62   Foot/eye exam completion dates Latest Ref Rng & Units 04/12/2016 01/25/2016  Eye  Exam No Retinopathy - -  Foot exam Order - - -  Foot Form Completion - Done Done      Refer for eye exam which is past due  Essential hypertension Controlled, no change in medication DASH diet and commitment to daily physical activity for a minimum of 30 minutes discussed and encouraged, as a part of hypertension management. The importance of attaining a healthy weight is also discussed.  BP/Weight 11/05/2016 08/24/2016 08/21/2016 08/13/2016 06/05/2016 04/12/2016 0/98/1191  Systolic BP 478 295 621 308 657 846 962  Diastolic BP 62 59 90 72 70 70 76    Wt. (Lbs) 200 - 202 200 201 201 201.08  BMI 35.43 - 35.78 35.43 35.61 35.61 35.62       Hyperlipidemia LDL goal <100 Improved Hyperlipidemia:Low fat diet discussed and encouraged.   Lipid Panel  Lab Results  Component Value Date   CHOL 111 11/02/2016   HDL 31 (L) 11/02/2016   LDLCALC 45 06/01/2016   TRIG 155 (H) 11/02/2016   CHOLHDL 3.6 11/02/2016   Needs to increase exercise    Morbid obesity Deteriorated. Patient re-educated about  the importance of commitment to a  minimum of 150 minutes of exercise per week.  The importance of healthy food choices with portion control discussed. Encouraged to start a food diary, count calories and to consider  joining a support group. Sample diet sheets offered. Goals set by the patient for the next several months.   Weight /BMI 11/05/2016 08/21/2016 08/13/2016  WEIGHT 200 lb 202 lb 200 lb  HEIGHT 5\' 3"  5\' 3"  5\' 3"   BMI 35.43 kg/m2 35.78 kg/m2 35.43 kg/m2

## 2016-11-10 NOTE — Assessment & Plan Note (Signed)
Deteriorated. Patient re-educated about  the importance of commitment to a  minimum of 150 minutes of exercise per week.  The importance of healthy food choices with portion control discussed. Encouraged to start a food diary, count calories and to consider  joining a support group. Sample diet sheets offered. Goals set by the patient for the next several months.   Weight /BMI 11/05/2016 08/21/2016 08/13/2016  WEIGHT 200 lb 202 lb 200 lb  HEIGHT 5\' 3"  5\' 3"  5\' 3"   BMI 35.43 kg/m2 35.78 kg/m2 35.43 kg/m2

## 2016-11-14 ENCOUNTER — Ambulatory Visit (INDEPENDENT_AMBULATORY_CARE_PROVIDER_SITE_OTHER): Payer: PPO | Admitting: Endocrinology

## 2016-11-14 ENCOUNTER — Encounter: Payer: Self-pay | Admitting: Endocrinology

## 2016-11-14 VITALS — BP 132/72 | HR 63 | Wt 201.2 lb

## 2016-11-14 DIAGNOSIS — E119 Type 2 diabetes mellitus without complications: Secondary | ICD-10-CM | POA: Diagnosis not present

## 2016-11-14 DIAGNOSIS — IMO0001 Reserved for inherently not codable concepts without codable children: Secondary | ICD-10-CM

## 2016-11-14 DIAGNOSIS — Z794 Long term (current) use of insulin: Secondary | ICD-10-CM

## 2016-11-14 LAB — POCT GLYCOSYLATED HEMOGLOBIN (HGB A1C): Hemoglobin A1C: 7.3

## 2016-11-14 MED ORDER — INSULIN REGULAR HUMAN 100 UNIT/ML IJ SOLN: 25.0000 [IU] | Freq: Every day | INTRAMUSCULAR | 4 refills | Status: DC

## 2016-11-14 MED ORDER — INSULIN NPH ISOPHANE & REGULAR (70-30) 100 UNIT/ML ~~LOC~~ SUSP: 180.0000 [IU] | Freq: Every day | SUBCUTANEOUS | 11 refills | Status: DC

## 2016-11-14 NOTE — Patient Instructions (Addendum)
check your blood sugar twice a day.  vary the time of day when you check, between before the 3 meals, and at bedtime.  also check if you have symptoms of your blood sugar being too high or too low.  please keep a record of the readings and bring it to your next appointment here.  You can write it on any piece of paper.  please call us sooner if your blood sugar goes below 70, or if you have a lot of readings over 200.   Please continue the same morning insulin, and Vary the evening regular insulin from 25-35 units, according to how big your evening meal is. On this type of insulin schedule, you should eat meals on a regular schedule.  If a meal is missed or significantly delayed, your blood sugar could go low.  If you want to have an easy and painless circulation test, please let us know Please come back for a follow-up appointment in 4 months.

## 2016-11-14 NOTE — Progress Notes (Signed)
Subjective:    Patient ID: Krista Strickland, female    DOB: 1950-04-27, 66 y.o.   MRN: [id number]  HPI Pt returns for f/u of diabetes.   DM type: insulin-requiring type 2 Dx'ed: 3893 Complications: polyneuropathy, renal insufficiency and CVA.  Therapy: insulin since 2010 GDM: never DKA: never Severe hypoglycemia: never.   Pancreatitis: never.   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 she cannot afford analogs, so we had to try 70/30 (the NPH caused no reaction this time).  She takes 70/30 QAM and reg QPM, due to the pattern of cbg's.   Interval history:  She wants to know if she qualifies for DM shoes.  Meter is downloaded today, and the printout is scanned into the record.  cbg varies from 80-330.  It is in general higher as the day goes on, but not necessarily so.  She says variability of hs-cbg is due to variable size of evening meal.  pt states she feels well in general.  She says she never misses the insulin.   Pt also has small multinodular goiter (euthyroid; f/u US in 2017 showed no change).   Past Medical History:  Diagnosis Date  . Arthritis   . Cancer ([place])    uterine  . Chronic kidney disease   . Coronary artery disease   . Diabetes mellitus, type 2 ([place])   . GERD (gastroesophageal reflux disease)    no medication in 2017  . Gout   . History of MRSA infection 03/2009   pt denies this  . Hypercalcemia 2017   managed by nephrology  . Hyperlipidemia   . Hypertension   . Obesity   . Psoriasis   . Stroke [facility]) 2013   "light"    Past Surgical History:  Procedure Laterality Date  . ABDOMINAL HYSTERECTOMY    . APPENDECTOMY  2012  . COLONOSCOPY WITH PROPOFOL N/A 08/24/2016   Procedure: COLONOSCOPY WITH PROPOFOL;  Surgeon: Rogene Houston, MD;  Location: [facility];  Service: Endoscopy;  Laterality: N/A;  10:30  . excison of rt breast cyst    . LAPAROSCOPIC SALPINGO  OOPHERECTOMY Right 04/22/2012   Procedure: LAPAROSCOPIC SALPINGO OOPHORECTOMY;  Surgeon: Jonnie Kind, MD;  Location: [facility] ORS;  Service: Gynecology;  Laterality: Right;  end 11:17  . MASS EXCISION N/A 04/22/2012   Procedure: EXCISION SKIN TAGS NECK AND HEAD;  Surgeon: Jonnie Kind, MD;  Location: [facility] ORS;  Service: Gynecology;  Laterality: N/A;  start 11:19  . PARTIAL HYSTERECTOMY    . POLYPECTOMY  08/24/2016   Procedure: POLYPECTOMY;  Surgeon: Rogene Houston, MD;  Location: [facility];  Service: Endoscopy;;  colon  . rt,. neck biopsy      Social History   Social History  . Marital status: Married    Spouse name: N/A  . Number of children: 1  . Years of education: N/A   Occupational History  . disabled  Unemployed   Social History Main Topics  . Smoking status: Former Smoker    Packs/day: 0.25    Years: 12.00    Quit date: 07/16/1978  . Smokeless tobacco: Never Used  . Alcohol use No  . Drug use: No  . Sexual activity: Yes    Birth control/ protection: Surgical   Other Topics Concern  . Not on file   Social History Narrative  . No narrative on file    Current Outpatient  Prescriptions on File Prior to Visit  Medication Sig Dispense Refill  . allopurinol (ZYLOPRIM) 300 MG tablet TAKE 1 TABLET BY MOUTH ONCE DAILY 30 tablet 5  . aspirin 325 MG tablet Take 1 tablet (325 mg total) by mouth daily.    . calcitRIOL (ROCALTROL) 0.25 MCG capsule Take 0.25 mcg by mouth daily.    . cyclobenzaprine (FLEXERIL) 10 MG tablet Take 10 mg by mouth 3 (three) times daily as needed for muscle spasms.     Marland Kitchen diltiazem (DILACOR XR) 180 MG 24 hr capsule TAKE 1 CAPSULE BY MOUTH ONCE DAILY 90 capsule 1  . docusate sodium (COLACE) 100 MG capsule Take 100 mg by mouth 2 (two) times daily. Constipation    . ezetimibe (ZETIA) 10 MG tablet TAKE ONE TABLET BY MOUTH ONCE DAILY 90 tablet 1  . HYDROcodone-acetaminophen (NORCO) 7.5-325 MG per tablet Take 1 tablet by mouth 3 (three) times daily as needed  for moderate pain.     . metolazone (ZAROXOLYN) 5 MG tablet TAKE 1 TABLET BY MOUTH ONCE DAILY 90 tablet 1  . metoprolol tartrate (LOPRESSOR) 100 MG tablet TAKE 1 TABLET BY MOUTH TWICE DAILY 180 tablet 1  . midodrine (PROAMATINE) 5 MG tablet Take 5 mg by mouth 2 (two) times daily.     . ONE TOUCH ULTRA TEST test strip USE ONE STRIP TO CHECK GLUCOSE TWICE DAILY 100 each 11  . ONETOUCH DELICA LANCETS 99J MISC 1 Device by Does not apply route 2 (two) times daily. 60 each 11  . potassium chloride (K-DUR) 10 MEQ tablet TAKE THREE TABLETS BY MOUTH THREE TIMES DAILY 270 tablet 5  . pravastatin (PRAVACHOL) 20 MG tablet TAKE 1 TABLET BY MOUTH ONCE DAILY WITH BREAKFAST 90 tablet 1  . RELION INSULIN SYR 1CC/30G 30G X 5/16" 1 ML MISC USE ONE SYRINGE TO INJECT INSULIN SUBCUTANEOUSLY THREE TIMES DAILY AS DIRECTED 150 each 0  . RELION INSULIN SYRINGE 31G X 15/64" 1 ML MISC USE 1 SYRINGE THREE TIMES DAILY AS DIRECTED 150 each 0  . spironolactone (ALDACTONE) 25 MG tablet TAKE ONE TABLET BY MOUTH ONCE DAILY 90 tablet 1  . torsemide (DEMADEX) 20 MG tablet Take 80 mg by mouth daily.      No current facility-administered medications on file prior to visit.     Allergies  Allergen Reactions  . Benazepril Swelling  . Metronidazole Hives  . Mobic [Meloxicam] Hives  . Penicillins Hives and Swelling    Has patient had a PCN reaction causing immediate rash, facial/tongue/throat swelling, SOB or lightheadedness with hypotension: No Has patient had a PCN reaction causing severe rash involving mucus membranes or skin necrosis: Yes Has patient had a PCN reaction that required hospitalization: No Has patient had a PCN reaction occurring within the last 10 years: No If all of the above answers are "NO", then may proceed with Cephalosporin use.   . Sulfonamide Derivatives Hives    Family History  Problem Relation Age of Onset  . Hypertension Brother   . Gout Brother   . Prostate cancer Brother   . Hypertension  Brother   . Prostate cancer Brother   . Gout Brother   . Hypertension Sister   . Gout Sister   . Cancer Sister 35       pancreatic   . Leukemia Sister 84  . Gout Sister   . Prostate cancer Brother   . Diabetes Neg Hx     BP 132/72   Pulse 63   Wt 201  lb 3.2 oz (91.3 kg)   SpO2 98%   BMI 35.64 kg/m     Review of Systems She denies hypoglycemia    Objective:   Physical Exam VITAL SIGNS:  See vs page GENERAL: no distress.  Pulses: foot pulses are intact bilaterally.   MSK: no deformity of the feet or ankles.  CV: 2+ bilat edema of the legs.  Skin:  no ulcer on the feet or ankles.  normal color and temp on the feet and ankles.  Neuro: sensation is intact to touch on the feet and ankles.   Ext: There is bilateral onychomycosis of the toenails.    Lab Results  Component Value Date   HGBA1C 7.3 11/14/2016      Assessment & Plan:  Insulin-requiring type 2 DM, with CVA.  He needs to vary reg insulin.   Patient Instructions  check your blood sugar twice a day.  vary the time of day when you check, between before the 3 meals, and at bedtime.  also check if you have symptoms of your blood sugar being too high or too low.  please keep a record of the readings and bring it to your next appointment here.  You can write it on any piece of paper.  please call us sooner if your blood sugar goes below 70, or if you have a lot of readings over 200.   Please continue the same morning insulin, and Vary the evening regular insulin from 25-35 units, according to how big your evening meal is. On this type of insulin schedule, you should eat meals on a regular schedule.  If a meal is missed or significantly delayed, your blood sugar could go low.  If you want to have an easy and painless circulation test, please let us know Please come back for a follow-up appointment in 4 months.

## 2016-11-19 DIAGNOSIS — I69898 Other sequelae of other cerebrovascular disease: Secondary | ICD-10-CM | POA: Diagnosis not present

## 2016-11-19 DIAGNOSIS — R2681 Unsteadiness on feet: Secondary | ICD-10-CM | POA: Diagnosis not present

## 2016-11-19 DIAGNOSIS — I951 Orthostatic hypotension: Secondary | ICD-10-CM | POA: Diagnosis not present

## 2016-11-19 DIAGNOSIS — E1143 Type 2 diabetes mellitus with diabetic autonomic (poly)neuropathy: Secondary | ICD-10-CM | POA: Diagnosis not present

## 2016-11-23 ENCOUNTER — Other Ambulatory Visit: Payer: Self-pay | Admitting: Endocrinology

## 2016-11-29 ENCOUNTER — Other Ambulatory Visit: Payer: Self-pay | Admitting: Endocrinology

## 2016-12-03 DIAGNOSIS — M79674 Pain in right toe(s): Secondary | ICD-10-CM | POA: Diagnosis not present

## 2016-12-03 DIAGNOSIS — E1140 Type 2 diabetes mellitus with diabetic neuropathy, unspecified: Secondary | ICD-10-CM | POA: Diagnosis not present

## 2016-12-03 DIAGNOSIS — M79675 Pain in left toe(s): Secondary | ICD-10-CM | POA: Diagnosis not present

## 2016-12-03 DIAGNOSIS — B351 Tinea unguium: Secondary | ICD-10-CM | POA: Diagnosis not present

## 2016-12-04 ENCOUNTER — Other Ambulatory Visit: Payer: Self-pay | Admitting: Endocrinology

## 2016-12-06 DIAGNOSIS — M48062 Spinal stenosis, lumbar region with neurogenic claudication: Secondary | ICD-10-CM | POA: Diagnosis not present

## 2016-12-06 DIAGNOSIS — Z6832 Body mass index (BMI) 32.0-32.9, adult: Secondary | ICD-10-CM | POA: Diagnosis not present

## 2016-12-06 DIAGNOSIS — M62838 Other muscle spasm: Secondary | ICD-10-CM | POA: Diagnosis not present

## 2016-12-06 DIAGNOSIS — M5416 Radiculopathy, lumbar region: Secondary | ICD-10-CM | POA: Diagnosis not present

## 2016-12-07 ENCOUNTER — Other Ambulatory Visit: Payer: Self-pay | Admitting: Family Medicine

## 2016-12-10 DIAGNOSIS — D509 Iron deficiency anemia, unspecified: Secondary | ICD-10-CM | POA: Diagnosis not present

## 2016-12-10 DIAGNOSIS — R809 Proteinuria, unspecified: Secondary | ICD-10-CM | POA: Diagnosis not present

## 2016-12-10 DIAGNOSIS — Z79899 Other long term (current) drug therapy: Secondary | ICD-10-CM | POA: Diagnosis not present

## 2016-12-10 DIAGNOSIS — I10 Essential (primary) hypertension: Secondary | ICD-10-CM | POA: Diagnosis not present

## 2016-12-10 DIAGNOSIS — N183 Chronic kidney disease, stage 3 (moderate): Secondary | ICD-10-CM | POA: Diagnosis not present

## 2016-12-10 DIAGNOSIS — E559 Vitamin D deficiency, unspecified: Secondary | ICD-10-CM | POA: Diagnosis not present

## 2016-12-11 DIAGNOSIS — E876 Hypokalemia: Secondary | ICD-10-CM | POA: Diagnosis not present

## 2016-12-11 DIAGNOSIS — I509 Heart failure, unspecified: Secondary | ICD-10-CM | POA: Diagnosis not present

## 2016-12-11 DIAGNOSIS — D649 Anemia, unspecified: Secondary | ICD-10-CM | POA: Diagnosis not present

## 2016-12-11 DIAGNOSIS — E1129 Type 2 diabetes mellitus with other diabetic kidney complication: Secondary | ICD-10-CM | POA: Diagnosis not present

## 2016-12-11 DIAGNOSIS — R809 Proteinuria, unspecified: Secondary | ICD-10-CM | POA: Diagnosis not present

## 2016-12-11 DIAGNOSIS — I10 Essential (primary) hypertension: Secondary | ICD-10-CM | POA: Diagnosis not present

## 2016-12-11 DIAGNOSIS — N183 Chronic kidney disease, stage 3 (moderate): Secondary | ICD-10-CM | POA: Diagnosis not present

## 2016-12-17 ENCOUNTER — Ambulatory Visit: Payer: PPO | Admitting: Endocrinology

## 2016-12-27 ENCOUNTER — Other Ambulatory Visit: Payer: Self-pay | Admitting: Family Medicine

## 2016-12-27 ENCOUNTER — Other Ambulatory Visit: Payer: Self-pay | Admitting: Endocrinology

## 2017-01-01 ENCOUNTER — Encounter: Payer: Self-pay | Admitting: Family Medicine

## 2017-02-01 ENCOUNTER — Other Ambulatory Visit: Payer: Self-pay | Admitting: Family Medicine

## 2017-02-01 DIAGNOSIS — I10 Essential (primary) hypertension: Secondary | ICD-10-CM | POA: Diagnosis not present

## 2017-02-01 DIAGNOSIS — E785 Hyperlipidemia, unspecified: Secondary | ICD-10-CM | POA: Diagnosis not present

## 2017-02-01 DIAGNOSIS — E790 Hyperuricemia without signs of inflammatory arthritis and tophaceous disease: Secondary | ICD-10-CM | POA: Diagnosis not present

## 2017-02-01 DIAGNOSIS — E119 Type 2 diabetes mellitus without complications: Secondary | ICD-10-CM | POA: Diagnosis not present

## 2017-02-02 LAB — LIPID PANEL
CHOLESTEROL: 124 mg/dL (ref <200)
HDL: 27 mg/dL — ABNORMAL LOW (ref >50)
LDL Cholesterol (Calc): 73 mg/dL (calc)
Non-HDL Cholesterol (Calc): 97 mg/dL (calc) (ref <130)
Total CHOL/HDL Ratio: 4.6 (calc) (ref <5.0)
Triglycerides: 159 mg/dL — ABNORMAL HIGH (ref <150)

## 2017-02-02 LAB — TSH: TSH: 1.86 m[IU]/L (ref 0.40–4.50)

## 2017-02-02 LAB — COMPLETE METABOLIC PANEL WITH GFR
AG Ratio: 1.6 (calc) (ref 1.0–2.5)
ALBUMIN MSPROF: 4.1 g/dL (ref 3.6–5.1)
ALT: 22 U/L (ref 6–29)
AST: 20 U/L (ref 10–35)
Alkaline phosphatase (APISO): 65 U/L (ref 33–130)
BUN / CREAT RATIO: 38 (calc) — AB (ref 6–22)
BUN: 75 mg/dL — AB (ref 7–25)
CO2: 32 mmol/L (ref 20–32)
CREATININE: 2 mg/dL — AB (ref 0.50–0.99)
Calcium: 10.3 mg/dL (ref 8.6–10.4)
Chloride: 98 mmol/L (ref 98–110)
GFR, EST AFRICAN AMERICAN: 29 mL/min/{1.73_m2} — AB (ref >=60)
GFR, Est Non African American: 25 mL/min/{1.73_m2} — ABNORMAL LOW (ref >=60)
GLUCOSE: 157 mg/dL — AB (ref 65–99)
Globulin: 2.5 g/dL (calc) (ref 1.9–3.7)
Potassium: 3.7 mmol/L (ref 3.5–5.3)
Sodium: 141 mmol/L (ref 135–146)
TOTAL PROTEIN: 6.6 g/dL (ref 6.1–8.1)
Total Bilirubin: 0.5 mg/dL (ref 0.2–1.2)

## 2017-02-02 LAB — URIC ACID: Uric Acid, Serum: 6.8 mg/dL (ref 2.5–7.0)

## 2017-02-02 LAB — VITAMIN D 25 HYDROXY (VIT D DEFICIENCY, FRACTURES): Vit D, 25-Hydroxy: 27 ng/mL — ABNORMAL LOW (ref 30–100)

## 2017-02-04 ENCOUNTER — Ambulatory Visit (INDEPENDENT_AMBULATORY_CARE_PROVIDER_SITE_OTHER): Payer: PPO | Admitting: Family Medicine

## 2017-02-04 ENCOUNTER — Encounter: Payer: Self-pay | Admitting: Family Medicine

## 2017-02-04 VITALS — BP 130/68 | HR 67 | Resp 16 | Ht 63.0 in | Wt 201.0 lb

## 2017-02-04 DIAGNOSIS — Z1211 Encounter for screening for malignant neoplasm of colon: Secondary | ICD-10-CM

## 2017-02-04 DIAGNOSIS — E119 Type 2 diabetes mellitus without complications: Secondary | ICD-10-CM

## 2017-02-04 DIAGNOSIS — Z0000 Encounter for general adult medical examination without abnormal findings: Secondary | ICD-10-CM

## 2017-02-04 DIAGNOSIS — Z794 Long term (current) use of insulin: Secondary | ICD-10-CM

## 2017-02-04 DIAGNOSIS — IMO0001 Reserved for inherently not codable concepts without codable children: Secondary | ICD-10-CM

## 2017-02-04 NOTE — Progress Notes (Signed)
Krista Strickland     MRN: [id number]      DOB: 12/08/1950  HPI: Patient is in for annual physical exam. No other health concerns are expressed or addressed at the visit. Recent labs,  are reviewed. Immunization is reviewed , and  Is up to date   PE: BP 130/68   Pulse 67   Resp 16   Ht 5\' 3"  (1.6 m)   Wt 201 lb (91.2 kg)   SpO2 95%   BMI 35.61 kg/m   Pleasant  female, alert and oriented x 3, in no cardio-pulmonary distress. Afebrile. HEENT No facial trauma or asymetry. Sinuses non tender.  Extra occullar muscles intact, . External ears normal, tympanic membranes clear. Oropharynx moist, no exudate. Neck: supple, no adenopathy,JVD or thyromegaly.No bruits.  Chest: Clear to ascultation bilaterally.No crackles or wheezes. Non tender to palpation  Breast: No asymetry,no masses or lumps. No tenderness. No nipple discharge or inversion. No axillary or supraclavicular adenopathy  Cardiovascular system; Heart sounds normal,  S1 and  S2 ,no S3.  No murmur, or thrill. Apical beat not displaced Peripheral pulses normal.  Abdomen: Soft, non tender, no organomegaly or masses. No bruits. Bowel sounds normal. No guarding, tenderness or rebound.  Rectal:  Normal sphincter tone. No rectal mass. Guaiac negative stool.  GU: Not examined   Musculoskeletal exam: Full ROM of spine, hips , shoulders and knees. No deformity ,swelling or crepitus noted. No muscle wasting or atrophy.   Neurologic: Cranial nerves 2 to 12 intact. Power, tone ,sensation and reflexes normal throughout. No disturbance in gait. No tremor.  Skin: Intact, no ulceration, erythema , scaling or rash noted. Pigmentation normal throughout  Psych; Normal mood and affect. Judgement and concentration normal   Assessment & Plan:  Annual physical exam Annual exam as documented. Counseling done  re healthy lifestyle involving commitment to 150 minutes exercise per week, heart healthy diet, and  attaining healthy weight.The importance of adequate sleep also discussed. Regular seat belt use and home safety, is also discussed. Changes in health habits are decided on by the patient with goals and time frames  set for achieving them. Immunization and cancer screening needs are specifically addressed at this visit.   Diabetes mellitus, insulin dependent (IDDM), controlled ([place]) Ms. Pauls is reminded of the importance of commitment to daily physical activity for 30 minutes or more, as able and the need to limit carbohydrate intake to 30 to 60 grams per meal to help with blood sugar control.   The need to take medication as prescribed, test blood sugar as directed, and to call between visits if there is a concern that blood sugar is uncontrolled is also discussed.   Ms. Lanza is reminded of the importance of daily foot exam, annual eye examination, and good blood sugar, blood pressure and cholesterol control.  Diabetic Labs Latest Ref Rng & Units 02/01/2017 11/14/2016 11/02/2016 08/13/2016 06/01/2016  HbA1c - - 7.3 - 7.7 -  Microalbumin Not Estab. ug/mL - - - - -  Micro/Creat Ratio 0.0 - 30.0 mg/g creat - - - - -  Chol <200 mg/dL 124 - 111 - 122  HDL >50 mg/dL 27(L) - 31(L) - 22(L)  Calc LDL <100 mg/dL - - - - 45  Triglycerides <150 mg/dL 159(H) - 155(H) - 275(H)  Creatinine 0.50 - 0.99 mg/dL 2.00(H) - 2.19(H) - 2.03(H)   BP/Weight 02/04/2017 11/14/2016 11/05/2016 08/24/2016 08/21/2016 0/03/4915 09/30/5054  Systolic BP 979 480 165 537 482 707 867  Diastolic  BP 68 72 62 59 90 72 70  Wt. (Lbs) 201 201.2 200 - 202 200 201  BMI 35.61 35.64 35.43 - 35.78 35.43 35.61   Foot/eye exam completion dates Latest Ref Rng & Units 11/14/2016 04/12/2016  Eye Exam No Retinopathy - -  Foot exam Order - - -  Foot Form Completion - Done Done   Managed by endo

## 2017-02-04 NOTE — Patient Instructions (Signed)
Wellness with nurse in 4 months  Please get eye appt with Dr Gershon Crane in [place] scheduled at [facility] from office today   MD visit in end August or early September, call if you need me before  It is important that you exercise regularly at least 30 minutes 5 times a week. If you develop chest pain, have severe difficulty breathing, or feel very tired, stop exercising immediately and seek medical attention    Please work on good  health habits so that your health will improve. 1. Commitment to daily physical activity for 30 to 60  minutes, if you are able to do this.  2. Commitment to wise food choices. Aim for half of your  food intake to be vegetable and fruit, one quarter starchy foods, and one quarter protein. Try to eat on a regular schedule  3 meals per day, snacking between meals should be limited to vegetables or fruits or small portions of nuts. 64 ounces of water per day is generally recommended, unless you have specific health conditions, like heart failure or kidney failure where you will need to limit fluid intake.  3. Commitment to sufficient and a  good quality of physical and mental rest daily, generally between 6 to 8 hours per day.  WITH PERSISTANCE AND PERSEVERANCE, THE IMPOSSIBLE , BECOMES THE NORM!

## 2017-02-04 NOTE — Assessment & Plan Note (Signed)
Krista Strickland is reminded of the importance of commitment to daily physical activity for 30 minutes or more, as able and the need to limit carbohydrate intake to 30 to 60 grams per meal to help with blood sugar control.   The need to take medication as prescribed, test blood sugar as directed, and to call between visits if there is a concern that blood sugar is uncontrolled is also discussed.   Krista Strickland is reminded of the importance of daily foot exam, annual eye examination, and good blood sugar, blood pressure and cholesterol control.  Diabetic Labs Latest Ref Rng & Units 02/01/2017 11/14/2016 11/02/2016 08/13/2016 06/01/2016  HbA1c - - 7.3 - 7.7 -  Microalbumin Not Estab. ug/mL - - - - -  Micro/Creat Ratio 0.0 - 30.0 mg/g creat - - - - -  Chol <200 mg/dL 124 - 111 - 122  HDL >50 mg/dL 27(L) - 31(L) - 22(L)  Calc LDL <100 mg/dL - - - - 45  Triglycerides <150 mg/dL 159(H) - 155(H) - 275(H)  Creatinine 0.50 - 0.99 mg/dL 2.00(H) - 2.19(H) - 2.03(H)   BP/Weight 02/04/2017 11/14/2016 11/05/2016 08/24/2016 08/21/2016 6/38/4665 10/07/3568  Systolic BP 177 939 030 092 330 076 226  Diastolic BP 68 72 62 59 90 72 70  Wt. (Lbs) 201 201.2 200 - 202 200 201  BMI 35.61 35.64 35.43 - 35.78 35.43 35.61   Foot/eye exam completion dates Latest Ref Rng & Units 11/14/2016 04/12/2016  Eye Exam No Retinopathy - -  Foot exam Order - - -  Foot Form Completion - Done Done   Managed by endo

## 2017-02-04 NOTE — Assessment & Plan Note (Signed)

## 2017-02-05 ENCOUNTER — Encounter: Payer: Self-pay | Admitting: Family Medicine

## 2017-02-05 ENCOUNTER — Other Ambulatory Visit (HOSPITAL_COMMUNITY)
Admission: RE | Admit: 2017-02-05 | Discharge: 2017-02-05 | Disposition: A | Discharge status: Home / Self Care | Payer: PPO | Source: Ambulatory Visit | Attending: Family Medicine | Admitting: Family Medicine

## 2017-02-05 DIAGNOSIS — E119 Type 2 diabetes mellitus without complications: Secondary | ICD-10-CM | POA: Insufficient documentation

## 2017-02-05 LAB — POC HEMOCCULT BLD/STL (OFFICE/1-CARD/DIAGNOSTIC): FECAL OCCULT BLD: NEGATIVE

## 2017-02-05 NOTE — Addendum Note (Signed)
Addended by: Eual Fines on: 02/05/2017 08:04 AM   Modules accepted: Orders

## 2017-02-06 LAB — MICROALBUMIN / CREATININE URINE RATIO
CREATININE, UR: 74.5 mg/dL
MICROALB UR: 13.3 ug/mL — AB
Microalb Creat Ratio: 17.9 mg/g creat (ref 0.0–30.0)

## 2017-02-11 DIAGNOSIS — M79674 Pain in right toe(s): Secondary | ICD-10-CM | POA: Diagnosis not present

## 2017-02-11 DIAGNOSIS — B351 Tinea unguium: Secondary | ICD-10-CM | POA: Diagnosis not present

## 2017-02-11 DIAGNOSIS — E1140 Type 2 diabetes mellitus with diabetic neuropathy, unspecified: Secondary | ICD-10-CM | POA: Diagnosis not present

## 2017-02-11 DIAGNOSIS — M79675 Pain in left toe(s): Secondary | ICD-10-CM | POA: Diagnosis not present

## 2017-02-15 ENCOUNTER — Other Ambulatory Visit: Payer: Self-pay | Admitting: Family Medicine

## 2017-02-15 DIAGNOSIS — Z1231 Encounter for screening mammogram for malignant neoplasm of breast: Secondary | ICD-10-CM

## 2017-02-18 ENCOUNTER — Ambulatory Visit (HOSPITAL_COMMUNITY)
Admission: RE | Admit: 2017-02-18 | Discharge: 2017-02-18 | Disposition: A | Discharge status: Home / Self Care | Payer: PPO | Source: Ambulatory Visit | Attending: Family Medicine | Admitting: Family Medicine

## 2017-02-18 DIAGNOSIS — Z1231 Encounter for screening mammogram for malignant neoplasm of breast: Secondary | ICD-10-CM | POA: Diagnosis not present

## 2017-02-26 ENCOUNTER — Other Ambulatory Visit: Payer: Self-pay | Admitting: Endocrinology

## 2017-03-05 DIAGNOSIS — M5416 Radiculopathy, lumbar region: Secondary | ICD-10-CM | POA: Diagnosis not present

## 2017-03-05 DIAGNOSIS — I10 Essential (primary) hypertension: Secondary | ICD-10-CM | POA: Diagnosis not present

## 2017-03-05 DIAGNOSIS — Z6832 Body mass index (BMI) 32.0-32.9, adult: Secondary | ICD-10-CM | POA: Diagnosis not present

## 2017-03-05 DIAGNOSIS — M48062 Spinal stenosis, lumbar region with neurogenic claudication: Secondary | ICD-10-CM | POA: Diagnosis not present

## 2017-03-08 DIAGNOSIS — E119 Type 2 diabetes mellitus without complications: Secondary | ICD-10-CM | POA: Diagnosis not present

## 2017-03-08 DIAGNOSIS — Z794 Long term (current) use of insulin: Secondary | ICD-10-CM | POA: Diagnosis not present

## 2017-03-08 LAB — HM DIABETES EYE EXAM

## 2017-03-11 ENCOUNTER — Other Ambulatory Visit: Payer: Self-pay | Admitting: Family Medicine

## 2017-03-11 ENCOUNTER — Other Ambulatory Visit: Payer: Self-pay | Admitting: Endocrinology

## 2017-03-18 ENCOUNTER — Encounter: Payer: Self-pay | Admitting: Endocrinology

## 2017-03-18 ENCOUNTER — Ambulatory Visit: Payer: PPO | Admitting: Endocrinology

## 2017-03-18 VITALS — BP 148/72 | HR 65 | Wt 200.4 lb

## 2017-03-18 DIAGNOSIS — Z794 Long term (current) use of insulin: Secondary | ICD-10-CM

## 2017-03-18 DIAGNOSIS — E119 Type 2 diabetes mellitus without complications: Secondary | ICD-10-CM

## 2017-03-18 DIAGNOSIS — IMO0001 Reserved for inherently not codable concepts without codable children: Secondary | ICD-10-CM

## 2017-03-18 LAB — POCT GLYCOSYLATED HEMOGLOBIN (HGB A1C): Hemoglobin A1C: 7.6

## 2017-03-18 NOTE — Progress Notes (Signed)
Subjective:    Patient ID: Krista Strickland, female    DOB: 1950-04-04, 67 y.o.   MRN: [id number]  HPI Pt returns for f/u of diabetes.   DM type: insulin-requiring type 2 Dx'ed: 4580 Complications: polyneuropathy, renal insufficiency and CVA.  Therapy: insulin since 2010 GDM: never DKA: never Severe hypoglycemia: never.   Pancreatitis: never.   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 she cannot afford analogs, so we had to try 70/30 (the NPH caused no reaction this time).  She takes 70/30 QAM and reg QPM, due to the pattern of cbg's.   Interval history:  no cbg record, but states she has mild hypoglycemia on Sunday afternoons. She takes 70/30, 180 units qam, and reg, 30 units with supper.  pt states she feels well in general.  Pt also has small multinodular goiter (euthyroid; f/u US in 2017 showed no change; she is euthyroid off rx).  Past Medical History:  Diagnosis Date  . Arthritis   . Cancer ([place])    uterine  . Chronic kidney disease   . Coronary artery disease   . Diabetes mellitus, type 2 ([place])   . GERD (gastroesophageal reflux disease)    no medication in 2017  . Gout   . History of MRSA infection 03/2009   pt denies this  . Hypercalcemia 2017   managed by nephrology  . Hyperlipidemia   . Hypertension   . Obesity   . Psoriasis   . Stroke [facility]) 2013   "light"    Past Surgical History:  Procedure Laterality Date  . ABDOMINAL HYSTERECTOMY    . APPENDECTOMY  2012  . COLONOSCOPY WITH PROPOFOL N/A 08/24/2016   Procedure: COLONOSCOPY WITH PROPOFOL;  Surgeon: Rogene Houston, MD;  [facility];  Service: Endoscopy;  Laterality: N/A;  10:30  . excison of rt breast cyst    . LAPAROSCOPIC SALPINGO OOPHERECTOMY Right 04/22/2012   Procedure: LAPAROSCOPIC SALPINGO OOPHORECTOMY;  Surgeon: Jonnie Kind, MD;  Location: [facility] ORS;  Service: Gynecology;  Laterality: Right;  end  11:17  . MASS EXCISION N/A 04/22/2012   Procedure: EXCISION SKIN TAGS NECK AND HEAD;  Surgeon: Jonnie Kind, MD;  Location: [facility] ORS;  Service: Gynecology;  Laterality: N/A;  start 11:19  . PARTIAL HYSTERECTOMY    . POLYPECTOMY  08/24/2016   Procedure: POLYPECTOMY;  Surgeon: Rogene Houston, MD;  Location[facility];  Service: Endoscopy;;  colon  . rt,. neck biopsy      Social History   Socioeconomic History  . Marital status: Married    Spouse name: Not on file  . Number of children: 1  . Years of education: Not on file  . Highest education level: Not on file  Social Needs  . Financial resource strain: Not on file  . Food insecurity - worry: Not on file  . Food insecurity - inability: Not on file  . Transportation needs - medical: Not on file  . Transportation needs - non-medical: Not on file  Occupational History  . Occupation: disabled     Fish farm manager: UNEMPLOYED  Tobacco Use  . Smoking status: Former Smoker    Packs/day: 0.25    Years: 12.00    Pack years: 3.00    Last attempt to quit: 07/16/1978    Years since quitting: 38.7  . Smokeless tobacco: Never Used  Substance and Sexual Activity  . Alcohol use: No  .  Drug use: No  . Sexual activity: Yes    Birth control/protection: Surgical  Other Topics Concern  . Not on file  Social History Narrative  . Not on file    Current Outpatient Medications on File Prior to Visit  Medication Sig Dispense Refill  . allopurinol (ZYLOPRIM) 300 MG tablet TAKE 1 TABLET BY MOUTH ONCE DAILY 30 tablet 5  . aspirin 325 MG tablet Take 1 tablet (325 mg total) by mouth daily.    . calcitRIOL (ROCALTROL) 0.25 MCG capsule Take 0.25 mcg by mouth daily.    . cyclobenzaprine (FLEXERIL) 10 MG tablet Take 10 mg by mouth 3 (three) times daily as needed for muscle spasms.     Marland Kitchen DILT-XR 180 MG 24 hr capsule TAKE 1 CAPSULE BY MOUTH ONCE DAILY 90 capsule 1  . docusate sodium (COLACE) 100 MG capsule Take 100 mg by mouth 2 (two) times daily.  Constipation    . ezetimibe (ZETIA) 10 MG tablet TAKE 1 TABLET BY MOUTH ONCE DAILY 90 tablet 1  . HYDROcodone-acetaminophen (NORCO) 7.5-325 MG per tablet Take 1 tablet by mouth 3 (three) times daily as needed for moderate pain.     Marland Kitchen insulin NPH-regular Human (NOVOLIN 70/30) (70-30) 100 UNIT/ML injection Inject 180 Units into the skin daily with breakfast. 60 mL 11  . insulin regular (NOVOLIN R RELION) 100 units/mL injection Inject 0.25-0.35 mLs (25-35 Units total) into the skin daily with supper. 10 mL 4  . metolazone (ZAROXOLYN) 5 MG tablet TAKE 1 TABLET BY MOUTH ONCE DAILY 90 tablet 1  . metoprolol tartrate (LOPRESSOR) 100 MG tablet TAKE 1 TABLET BY MOUTH TWICE DAILY 180 tablet 1  . midodrine (PROAMATINE) 5 MG tablet Take 5 mg by mouth 2 (two) times daily.     . ONE TOUCH ULTRA TEST test strip USE ONE STRIP TO CHECK GLUCOSE TWICE DAILY 100 each 4  . ONETOUCH DELICA LANCETS 25K MISC 1 Device by Does not apply route 2 (two) times daily. 60 each 11  . potassium chloride (K-DUR) 10 MEQ tablet TAKE THREE TABLETS BY MOUTH THREE TIMES DAILY 270 tablet 5  . pravastatin (PRAVACHOL) 20 MG tablet TAKE 1 TABLET BY MOUTH ONCE DAILY WITH BREAKFAST 90 tablet 1  . RELION INSULIN SYR 1CC/30G 30G X 5/16" 1 ML MISC USE ONE SYRINGE TO INJECT INSULIN SUBCUTANEOUSLY THREE TIMES DAILY AS DIRECTED 150 each 0  . RELION INSULIN SYRINGE 31G X 15/64" 1 ML MISC USE 1 SYRINGE THREE TIMES DAILY AS DIRECTED 150 each 0  . spironolactone (ALDACTONE) 25 MG tablet TAKE 1 TABLET BY MOUTH ONCE DAILY 90 tablet 1  . torsemide (DEMADEX) 20 MG tablet Take 80 mg by mouth daily.      No current facility-administered medications on file prior to visit.     Allergies  Allergen Reactions  . Benazepril Swelling  . Metronidazole Hives  . Mobic [Meloxicam] Hives  . Penicillins Hives and Swelling    Has patient had a PCN reaction causing immediate rash, facial/tongue/throat swelling, SOB or lightheadedness with hypotension: No Has  patient had a PCN reaction causing severe rash involving mucus membranes or skin necrosis: Yes Has patient had a PCN reaction that required hospitalization: No Has patient had a PCN reaction occurring within the last 10 years: No If all of the above answers are "NO", then may proceed with Cephalosporin use.   . Sulfonamide Derivatives Hives    Family History  Problem Relation Age of Onset  . Hypertension Brother   .  Gout Brother   . Prostate cancer Brother   . Hypertension Brother   . Prostate cancer Brother   . Gout Brother   . Hypertension Sister   . Gout Sister   . Cancer Sister 52       pancreatic   . Leukemia Sister 59  . Gout Sister   . Prostate cancer Brother   . Diabetes Neg Hx     BP (!) 148/72 (BP Location: Left Arm, Patient Position: Sitting, Cuff Size: Normal)   Pulse 65   Wt 200 lb 6.4 oz (90.9 kg)   SpO2 96%   BMI 35.50 kg/m   Review of Systems She denies LOC.      Objective:   Physical Exam VITAL SIGNS:  See vs page GENERAL: no distress.  Pulses: foot pulses are intact bilaterally.   MSK: no deformity of the feet or ankles.  CV: 2+ bilat edema of the legs.  Skin:  no ulcer on the feet or ankles.  normal color and temp on the feet and ankles.  Neuro: sensation is intact to touch on the feet and ankles.   Ext: There is bilateral onychomycosis of the toenails.   A1c=7.6%    Assessment & Plan:  Insulin-requiring type 2 DM, with CVA: worse.  Renal insuff: she does not need PM 70/30, just reg.  Hypoglycemia: this limits aggressiveness of glycemic control.   Patient Instructions  check your blood sugar twice a day.  vary the time of day when you check, between before the 3 meals, and at bedtime.  also check if you have symptoms of your blood sugar being too high or too low.  please keep a record of the readings and bring it to your next appointment here.  You can write it on any piece of paper.  please call us sooner if your blood sugar goes below 70,  or if you have a lot of readings over 200.   Please continue the morning insulin, 180 units.  However, take just 140 units on Sunday morning, and:  Please continue the evening regular insulin.  On this type of insulin schedule, you should eat meals on a regular schedule.  If a meal is missed or significantly delayed, your blood sugar could go low.  Please come back for a follow-up appointment in 3 months.

## 2017-03-18 NOTE — Patient Instructions (Addendum)
check your blood sugar twice a day.  vary the time of day when you check, between before the 3 meals, and at bedtime.  also check if you have symptoms of your blood sugar being too high or too low.  please keep a record of the readings and bring it to your next appointment here.  You can write it on any piece of paper.  please call us sooner if your blood sugar goes below 70, or if you have a lot of readings over 200.   Please continue the morning insulin, 180 units.  However, take just 140 units on Sunday morning, and:  Please continue the evening regular insulin.  On this type of insulin schedule, you should eat meals on a regular schedule.  If a meal is missed or significantly delayed, your blood sugar could go low.  Please come back for a follow-up appointment in 3 months.

## 2017-03-22 DIAGNOSIS — E1129 Type 2 diabetes mellitus with other diabetic kidney complication: Secondary | ICD-10-CM | POA: Diagnosis not present

## 2017-03-22 DIAGNOSIS — E559 Vitamin D deficiency, unspecified: Secondary | ICD-10-CM | POA: Diagnosis not present

## 2017-03-22 DIAGNOSIS — R809 Proteinuria, unspecified: Secondary | ICD-10-CM | POA: Diagnosis not present

## 2017-03-22 DIAGNOSIS — Z79899 Other long term (current) drug therapy: Secondary | ICD-10-CM | POA: Diagnosis not present

## 2017-03-22 DIAGNOSIS — I10 Essential (primary) hypertension: Secondary | ICD-10-CM | POA: Diagnosis not present

## 2017-03-22 DIAGNOSIS — N183 Chronic kidney disease, stage 3 (moderate): Secondary | ICD-10-CM | POA: Diagnosis not present

## 2017-03-22 DIAGNOSIS — D649 Anemia, unspecified: Secondary | ICD-10-CM | POA: Diagnosis not present

## 2017-03-26 DIAGNOSIS — I10 Essential (primary) hypertension: Secondary | ICD-10-CM | POA: Diagnosis not present

## 2017-03-26 DIAGNOSIS — E559 Vitamin D deficiency, unspecified: Secondary | ICD-10-CM | POA: Diagnosis not present

## 2017-03-26 DIAGNOSIS — I509 Heart failure, unspecified: Secondary | ICD-10-CM | POA: Diagnosis not present

## 2017-03-26 DIAGNOSIS — R809 Proteinuria, unspecified: Secondary | ICD-10-CM | POA: Diagnosis not present

## 2017-03-26 DIAGNOSIS — E1129 Type 2 diabetes mellitus with other diabetic kidney complication: Secondary | ICD-10-CM | POA: Diagnosis not present

## 2017-03-26 DIAGNOSIS — Z79899 Other long term (current) drug therapy: Secondary | ICD-10-CM | POA: Diagnosis not present

## 2017-03-26 DIAGNOSIS — E876 Hypokalemia: Secondary | ICD-10-CM | POA: Diagnosis not present

## 2017-03-26 DIAGNOSIS — N183 Chronic kidney disease, stage 3 (moderate): Secondary | ICD-10-CM | POA: Diagnosis not present

## 2017-03-28 ENCOUNTER — Other Ambulatory Visit: Payer: Self-pay | Admitting: Orthopaedic Surgery

## 2017-03-28 ENCOUNTER — Other Ambulatory Visit: Payer: Self-pay | Admitting: Family Medicine

## 2017-04-08 ENCOUNTER — Other Ambulatory Visit: Payer: Self-pay | Admitting: Radiology

## 2017-04-08 MED ORDER — ALLOPURINOL 300 MG PO TABS: 300.0000 mg | ORAL_TABLET | Freq: Every day | ORAL | 1 refills | Status: DC

## 2017-04-08 NOTE — Telephone Encounter (Signed)
Refill request received via fax for Allopurinol (Dr Luna Glasgow patient)

## 2017-04-15 ENCOUNTER — Other Ambulatory Visit: Payer: Self-pay | Admitting: Endocrinology

## 2017-04-26 ENCOUNTER — Other Ambulatory Visit: Payer: Self-pay | Admitting: Family Medicine

## 2017-04-26 ENCOUNTER — Other Ambulatory Visit: Payer: Self-pay

## 2017-04-26 MED ORDER — POTASSIUM CHLORIDE ER 10 MEQ PO TBCR
30.0000 meq | EXTENDED_RELEASE_TABLET | Freq: Three times a day (TID) | ORAL | 1 refills | Status: DC
Start: 2017-04-26 — End: 2018-03-31

## 2017-04-29 DIAGNOSIS — M79674 Pain in right toe(s): Secondary | ICD-10-CM | POA: Diagnosis not present

## 2017-04-29 DIAGNOSIS — B351 Tinea unguium: Secondary | ICD-10-CM | POA: Diagnosis not present

## 2017-04-29 DIAGNOSIS — E1140 Type 2 diabetes mellitus with diabetic neuropathy, unspecified: Secondary | ICD-10-CM | POA: Diagnosis not present

## 2017-04-29 DIAGNOSIS — M79675 Pain in left toe(s): Secondary | ICD-10-CM | POA: Diagnosis not present

## 2017-05-10 ENCOUNTER — Other Ambulatory Visit: Payer: Self-pay | Admitting: Family Medicine

## 2017-05-15 DIAGNOSIS — I69898 Other sequelae of other cerebrovascular disease: Secondary | ICD-10-CM | POA: Diagnosis not present

## 2017-05-15 DIAGNOSIS — E1140 Type 2 diabetes mellitus with diabetic neuropathy, unspecified: Secondary | ICD-10-CM | POA: Diagnosis not present

## 2017-05-15 DIAGNOSIS — R2689 Other abnormalities of gait and mobility: Secondary | ICD-10-CM | POA: Diagnosis not present

## 2017-05-15 DIAGNOSIS — I951 Orthostatic hypotension: Secondary | ICD-10-CM | POA: Diagnosis not present

## 2017-05-24 DIAGNOSIS — I10 Essential (primary) hypertension: Secondary | ICD-10-CM | POA: Diagnosis not present

## 2017-05-24 DIAGNOSIS — Z79899 Other long term (current) drug therapy: Secondary | ICD-10-CM | POA: Diagnosis not present

## 2017-05-24 DIAGNOSIS — D509 Iron deficiency anemia, unspecified: Secondary | ICD-10-CM | POA: Diagnosis not present

## 2017-05-24 DIAGNOSIS — E559 Vitamin D deficiency, unspecified: Secondary | ICD-10-CM | POA: Diagnosis not present

## 2017-05-24 DIAGNOSIS — N183 Chronic kidney disease, stage 3 (moderate): Secondary | ICD-10-CM | POA: Diagnosis not present

## 2017-05-24 DIAGNOSIS — R809 Proteinuria, unspecified: Secondary | ICD-10-CM | POA: Diagnosis not present

## 2017-05-28 DIAGNOSIS — D649 Anemia, unspecified: Secondary | ICD-10-CM | POA: Diagnosis not present

## 2017-05-28 DIAGNOSIS — E876 Hypokalemia: Secondary | ICD-10-CM | POA: Diagnosis not present

## 2017-05-28 DIAGNOSIS — N183 Chronic kidney disease, stage 3 (moderate): Secondary | ICD-10-CM | POA: Diagnosis not present

## 2017-05-28 DIAGNOSIS — E1129 Type 2 diabetes mellitus with other diabetic kidney complication: Secondary | ICD-10-CM | POA: Diagnosis not present

## 2017-05-28 DIAGNOSIS — I10 Essential (primary) hypertension: Secondary | ICD-10-CM | POA: Diagnosis not present

## 2017-05-28 DIAGNOSIS — M109 Gout, unspecified: Secondary | ICD-10-CM | POA: Diagnosis not present

## 2017-05-29 ENCOUNTER — Ambulatory Visit (INDEPENDENT_AMBULATORY_CARE_PROVIDER_SITE_OTHER): Payer: PPO

## 2017-05-29 VITALS — BP 136/54 | HR 64 | Temp 98.5°F | Resp 16 | Ht 66.0 in | Wt 203.0 lb

## 2017-05-29 DIAGNOSIS — Z0000 Encounter for general adult medical examination without abnormal findings: Secondary | ICD-10-CM

## 2017-05-29 NOTE — Patient Instructions (Signed)
Krista Strickland , Thank you for taking time to come for your Medicare Wellness Visit. I appreciate your ongoing commitment to your health goals. Please review the following plan we discussed and let me know if I can assist you in the future.   Screening recommendations/referrals: Colonoscopy: UTD (Due 2028) Mammogram: UTD (Due 2021) Bone Density: UTD Recommended yearly ophthalmology/optometry visit for glaucoma screening and checkup Recommended yearly dental visit for hygiene and checkup  Vaccinations: Influenza vaccine: Due Fall 2019 Pneumococcal vaccine: UTD  Tdap vaccine: UTD Shingles vaccine: Call insurance company and see if they will cover this vaccine    Advanced directives: Patient and her husband have discussed what their wishes are. Patient does not wish to have the paperwork at this time. If you change your mind, please let us know and we will give you the paperwork to complete.   Conditions/risks identified: Discussed at your visit   Next appointment: September 02, 2017 at 1:00 pm with Dr.Simpson   Preventive Care 67 Years and Older, Female Preventive care refers to lifestyle choices and visits with your health care provider that can promote health and wellness. What does preventive care include?  A yearly physical exam. This is also called an annual well check.  Dental exams once or twice a year.  Routine eye exams. Ask your health care provider how often you should have your eyes checked.  Personal lifestyle choices, including:  Daily care of your teeth and gums.  Regular physical activity.  Eating a healthy diet.  Avoiding tobacco and drug use.  Limiting alcohol use.  Practicing safe sex.  Taking low-dose aspirin every day.  Taking vitamin and mineral supplements as recommended by your health care provider. What happens during an annual well check? The services and screenings done by your health care provider during your annual well check will depend on your  age, overall health, lifestyle risk factors, and family history of disease. Counseling  Your health care provider may ask you questions about your:  Alcohol use.  Tobacco use.  Drug use.  Emotional well-being.  Home and relationship well-being.  Sexual activity.  Eating habits.  History of falls.  Memory and ability to understand (cognition).  Work and work Statistician.  Reproductive health. Screening  You may have the following tests or measurements:  Height, weight, and BMI.  Blood pressure.  Lipid and cholesterol levels. These may be checked every 5 years, or more frequently if you are over 35 years old.  Skin check.  Lung cancer screening. You may have this screening every year starting at age 73 if you have a 30-pack-year history of smoking and currently smoke or have quit within the past 15 years.  Fecal occult blood test (FOBT) of the stool. You may have this test every year starting at age 6.  Flexible sigmoidoscopy or colonoscopy. You may have a sigmoidoscopy every 5 years or a colonoscopy every 10 years starting at age 97.  Hepatitis C blood test.  Hepatitis B blood test.  Sexually transmitted disease (STD) testing.  Diabetes screening. This is done by checking your blood sugar (glucose) after you have not eaten for a while (fasting). You may have this done every 1-3 years.  Bone density scan. This is done to screen for osteoporosis. You may have this done starting at age 65.  Mammogram. This may be done every 1-2 years. Talk to your health care provider about how often you should have regular mammograms. Talk with your health care provider about your  test results, treatment options, and if necessary, the need for more tests. Vaccines  Your health care provider may recommend certain vaccines, such as:  Influenza vaccine. This is recommended every year.  Tetanus, diphtheria, and acellular pertussis (Tdap, Td) vaccine. You may need a Td booster  every 10 years.  Zoster vaccine. You may need this after age 80.  Pneumococcal 13-valent conjugate (PCV13) vaccine. One dose is recommended after age 24.  Pneumococcal polysaccharide (PPSV23) vaccine. One dose is recommended after age 13. Talk to your health care provider about which screenings and vaccines you need and how often you need them. This information is not intended to replace advice given to you by your health care provider. Make sure you discuss any questions you have with your health care provider. Document Released: 02/11/2015 Document Revised: 10/05/2015 Document Reviewed: 11/16/2014 Elsevier Interactive Patient Education  2017 [place] Prevention in the Home Falls can cause injuries. They can happen to people of all ages. There are many things you can do to make your home safe and to help prevent falls. What can I do on the outside of my home?  Regularly fix the edges of walkways and driveways and fix any cracks.  Remove anything that might make you trip as you walk through a door, such as a raised step or threshold.  Trim any bushes or trees on the path to your home.  Use bright outdoor lighting.  Clear any walking paths of anything that might make someone trip, such as rocks or tools.  Regularly check to see if handrails are loose or broken. Make sure that both sides of any steps have handrails.  Any raised decks and porches should have guardrails on the edges.  Have any leaves, snow, or ice cleared regularly.  Use sand or salt on walking paths during winter.  Clean up any spills in your garage right away. This includes oil or grease spills. What can I do in the bathroom?  Use night lights.  Install grab bars by the toilet and in the tub and shower. Do not use towel bars as grab bars.  Use non-skid mats or decals in the tub or shower.  If you need to sit down in the shower, use a plastic, non-slip stool.  Keep the floor dry. Clean up any  water that spills on the floor as soon as it happens.  Remove soap buildup in the tub or shower regularly.  Attach bath mats securely with double-sided non-slip rug tape.  Do not have throw rugs and other things on the floor that can make you trip. What can I do in the bedroom?  Use night lights.  Make sure that you have a light by your bed that is easy to reach.  Do not use any sheets or blankets that are too big for your bed. They should not hang down onto the floor.  Have a firm chair that has side arms. You can use this for support while you get dressed.  Do not have throw rugs and other things on the floor that can make you trip. What can I do in the kitchen?  Clean up any spills right away.  Avoid walking on wet floors.  Keep items that you use a lot in easy-to-reach places.  If you need to reach something above you, use a strong step stool that has a grab bar.  Keep electrical cords out of the way.  Do not use floor polish or wax that  makes floors slippery. If you must use wax, use non-skid floor wax.  Do not have throw rugs and other things on the floor that can make you trip. What can I do with my stairs?  Do not leave any items on the stairs.  Make sure that there are handrails on both sides of the stairs and use them. Fix handrails that are broken or loose. Make sure that handrails are as long as the stairways.  Check any carpeting to make sure that it is firmly attached to the stairs. Fix any carpet that is loose or worn.  Avoid having throw rugs at the top or bottom of the stairs. If you do have throw rugs, attach them to the floor with carpet tape.  Make sure that you have a light switch at the top of the stairs and the bottom of the stairs. If you do not have them, ask someone to add them for you. What else can I do to help prevent falls?  Wear shoes that:  Do not have high heels.  Have rubber bottoms.  Are comfortable and fit you well.  Are closed  at the toe. Do not wear sandals.  If you use a stepladder:  Make sure that it is fully opened. Do not climb a closed stepladder.  Make sure that both sides of the stepladder are locked into place.  Ask someone to hold it for you, if possible.  Clearly mark and make sure that you can see:  Any grab bars or handrails.  First and last steps.  Where the edge of each step is.  Use tools that help you move around (mobility aids) if they are needed. These include:  Canes.  Walkers.  Scooters.  Crutches.  Turn on the lights when you go into a dark area. Replace any light bulbs as soon as they burn out.  Set up your furniture so you have a clear path. Avoid moving your furniture around.  If any of your floors are uneven, fix them.  If there are any pets around you, be aware of where they are.  Review your medicines with your doctor. Some medicines can make you feel dizzy. This can increase your chance of falling. Ask your doctor what other things that you can do to help prevent falls. This information is not intended to replace advice given to you by your health care provider. Make sure you discuss any questions you have with your health care provider. Document Released: 11/11/2008 Document Revised: 06/23/2015 Document Reviewed: 02/19/2014 Elsevier Interactive Patient Education  2017 [employer].

## 2017-05-29 NOTE — Progress Notes (Signed)
Subjective:   Krista Strickland is a 67 y.o. female who presents for Medicare Annual (Subsequent) preventive examination.  Review of Systems:   Cardiac Risk Factors include: advanced age (>84men, >38 women);obesity (BMI >30kg/m2);diabetes mellitus;hypertension     Objective:     Vitals: BP (!) 136/54 (BP Location: Left Arm, Patient Position: Sitting, Cuff Size: Large)   Pulse 64   Temp 98.5 F (36.9 C) (Oral)   Resp 16   Ht 5\' 6"  (1.676 m)   Wt 203 lb 0.6 oz (92.1 kg)   SpO2 97%   BMI 32.77 kg/m   Body mass index is 32.77 kg/m.  Advanced Directives 05/29/2017 08/24/2016 08/21/2016 03/13/2016 04/16/2012  Does Patient Have a Medical Advance Directive? No No No No Patient does not have advance directive;Patient would not like information  Would patient like information on creating a medical advance directive? No - Patient declined No - Patient declined No - Patient declined Yes (MAU/Ambulatory/Procedural Areas - Information given) -  Pre-existing out of facility DNR order (yellow form or pink MOST form) - - - - No    Tobacco Social History   Tobacco Use  Smoking Status Former Smoker  . Packs/day: 0.25  . Years: 12.00  . Pack years: 3.00  . Last attempt to quit: 07/16/1978  . Years since quitting: 38.8  Smokeless Tobacco Never Used     Counseling given: Yes   Clinical Intake:  Pre-visit preparation completed: Yes  Pain : 0-10 Pain Score: 10-Worst pain ever Pain Type: Chronic pain Pain Location: Hip Pain Orientation: Left Pain Descriptors / Indicators: Aching, Constant, Cramping, Discomfort Pain Onset: In the past 7 days Pain Frequency: Intermittent Pain Relieving Factors: icy hot, ice packs Effect of Pain on Daily Activities: yes  Pain Relieving Factors: icy hot, ice packs  BMI - recorded: 32.8 Nutritional Status: BMI > 30  Obese Diabetes: Yes CBG done?: No Did pt. bring in CBG monitor from home?: No  How often do you need to have someone help you when you  read instructions, pamphlets, or other written materials from your doctor or pharmacy?: 1 - Never What is the last grade level you completed in school?: 11th grade     Information entered by :: Vilinda Blanks  Past Medical History:  Diagnosis Date  . Arthritis   . Cancer ([place])    uterine  . Chronic kidney disease   . Coronary artery disease   . Diabetes mellitus, type 2 ([place])   . GERD (gastroesophageal reflux disease)    no medication in 2017  . Gout   . History of MRSA infection 03/2009   pt denies this  . Hypercalcemia 2017   managed by nephrology  . Hyperlipidemia   . Hypertension   . Obesity   . Psoriasis   . Stroke [facility]) 2013   "light"   Past Surgical History:  Procedure Laterality Date  . ABDOMINAL HYSTERECTOMY    . APPENDECTOMY  2012  . COLONOSCOPY WITH PROPOFOL N/A 08/24/2016   Procedure: COLONOSCOPY WITH PROPOFOL;  Surgeon: Rogene Houston, MD;  Location: [facility];  Service: Endoscopy;  Laterality: N/A;  10:30  . excison of rt breast cyst    . LAPAROSCOPIC SALPINGO OOPHERECTOMY Right 04/22/2012   Procedure: LAPAROSCOPIC SALPINGO OOPHORECTOMY;  Surgeon: Jonnie Kind, MD;  Location: [facility] ORS;  Service: Gynecology;  Laterality: Right;  end 11:17  . MASS EXCISION N/A 04/22/2012   Procedure: EXCISION SKIN TAGS NECK AND HEAD;  Surgeon: Jonnie Kind,  MD;  Location: [facility] ORS;  Service: Gynecology;  Laterality: N/A;  start 11:19  . PARTIAL HYSTERECTOMY    . POLYPECTOMY  08/24/2016   Procedure: POLYPECTOMY;  Surgeon: Rogene Houston, MD;  Location: [facility];  Service: Endoscopy;;  colon  . rt,. neck biopsy     Family History  Problem Relation Age of Onset  . Hypertension Brother   . Gout Brother   . Prostate cancer Brother   . Hypertension Brother   . Prostate cancer Brother   . Gout Brother   . Hypertension Sister   . Gout Sister   . Cancer Sister 41       pancreatic   . Leukemia Sister 70  . Gout Sister   . Prostate cancer Brother   . Diabetes Neg  Hx    Social History   Socioeconomic History  . Marital status: Married    Spouse name: Not on file  . Number of children: 1  . Years of education: Not on file  . Highest education level: 11th grade  Occupational History  . Occupation: disabled     Fish farm manager: UNEMPLOYED  Social Needs  . Financial resource strain: Not very hard  . Food insecurity:    Worry: Never true    Inability: Never true  . Transportation needs:    Medical: No    Non-medical: No  Tobacco Use  . Smoking status: Former Smoker    Packs/day: 0.25    Years: 12.00    Pack years: 3.00    Last attempt to quit: 07/16/1978    Years since quitting: 38.8  . Smokeless tobacco: Never Used  Substance and Sexual Activity  . Alcohol use: No  . Drug use: No  . Sexual activity: Yes    Birth control/protection: Surgical  Lifestyle  . Physical activity:    Days per week: 0 days    Minutes per session: 0 min  . Stress: Not at all  Relationships  . Social connections:    Talks on phone: Twice a week    Gets together: Once a week    Attends religious service: More than 4 times per year    Active member of club or organization: No    Attends meetings of clubs or organizations: Never    Relationship status: Married  Other Topics Concern  . Not on file  Social History Narrative  . Not on file    Outpatient Encounter Medications as of 05/29/2017  Medication Sig  . allopurinol (ZYLOPRIM) 300 MG tablet Take 1 tablet (300 mg total) by mouth daily.  Marland Kitchen aspirin 325 MG tablet Take 1 tablet (325 mg total) by mouth daily.  . calcitRIOL (ROCALTROL) 0.25 MCG capsule Take 0.25 mcg by mouth daily.  . cyclobenzaprine (FLEXERIL) 10 MG tablet Take 10 mg by mouth 3 (three) times daily as needed for muscle spasms.   Marland Kitchen DILT-XR 180 MG 24 hr capsule TAKE 1 CAPSULE BY MOUTH ONCE DAILY  . docusate sodium (COLACE) 100 MG capsule Take 100 mg by mouth 2 (two) times daily. Constipation  . ezetimibe (ZETIA) 10 MG tablet TAKE 1 TABLET BY MOUTH  ONCE DAILY  . gabapentin (NEURONTIN) 300 MG capsule Take 300 mg by mouth 2 (two) times daily.  Marland Kitchen HYDROcodone-acetaminophen (NORCO) 7.5-325 MG per tablet Take 1 tablet by mouth 3 (three) times daily as needed for moderate pain.   Marland Kitchen insulin NPH-regular Human (NOVOLIN 70/30) (70-30) 100 UNIT/ML injection Inject 180 Units into the skin daily with breakfast.  .  insulin regular (NOVOLIN R RELION) 100 units/mL injection Inject 0.25-0.35 mLs (25-35 Units total) into the skin daily with supper.  . metolazone (ZAROXOLYN) 5 MG tablet TAKE 1 TABLET BY MOUTH ONCE DAILY  . metoprolol tartrate (LOPRESSOR) 100 MG tablet TAKE 1 TABLET BY MOUTH TWICE DAILY  . midodrine (PROAMATINE) 5 MG tablet Take 5 mg by mouth 2 (two) times daily.   . ONE TOUCH ULTRA TEST test strip USE ONE STRIP TO CHECK GLUCOSE TWICE DAILY  . ONETOUCH DELICA LANCETS 40J MISC 1 Device by Does not apply route 2 (two) times daily.  . potassium chloride (K-DUR) 10 MEQ tablet Take 3 tablets (30 mEq total) by mouth 3 (three) times daily.  . pravastatin (PRAVACHOL) 20 MG tablet TAKE 1 TABLET BY MOUTH ONCE DAILY WITH BREAKFAST  . RELION INSULIN SYR 1CC/30G 30G X 5/16" 1 ML MISC USE ONE SYRINGE TO INJECT INSULIN SUBCUTANEOUSLY THREE TIMES DAILY AS DIRECTED  . RELION INSULIN SYRINGE 31G X 15/64" 1 ML MISC USE 1 SYRINGE THREE TIMES DAILY AS  DIRECTED  . spironolactone (ALDACTONE) 25 MG tablet TAKE 1 TABLET BY MOUTH ONCE DAILY  . torsemide (DEMADEX) 20 MG tablet Take 80 mg by mouth daily.    No facility-administered encounter medications on file as of 05/29/2017.     Activities of Daily Living In your present state of health, do you have any difficulty performing the following activities: 05/29/2017 08/21/2016  Hearing? N N  Vision? N N  Difficulty concentrating or making decisions? Y N  Comment sometimes -  Walking or climbing stairs? N Y  Dressing or bathing? N N  Doing errands, shopping? N N  Preparing Food and eating ? N -  Using the Toilet? N -    In the past six months, have you accidently leaked urine? N -  Do you have problems with loss of bowel control? N -  Managing your Medications? N -  Managing your Finances? N -  Housekeeping or managing your Housekeeping? N -  Some recent data might be hidden    Patient Care Team: Fayrene Helper, MD as PCP - General Phillips Odor, MD as Attending Physician (Neurology) Fran Lowes, MD as Attending Physician (Nephrology) Renato Shin, MD as Consulting Physician (Endocrinology) Luvenia Starch Darlin Drop as Physician Assistant (Neurosurgery)    Assessment:   This is a routine wellness examination for Kieran.  Exercise Activities and Dietary recommendations Current Exercise Habits: The patient does not participate in regular exercise at present, Exercise limited by: orthopedic condition(s)  Goals    None      Fall Risk Fall Risk  05/29/2017 03/13/2016 07/20/2015 04/08/2014 07/09/2012  Falls in the past year? Yes No No No No  Number falls in past yr: 1 - - - -  Injury with Fall? Yes - - - -  Risk for fall due to : (No Data) - - - -  Risk for fall due to: Comment patient was helping church member with panty hose and went to sit down on ottoman and missed it and fell and hurt hip. Has not went to ED to be seen.  - - - -   Is the patient's home free of loose throw rugs in walkways, pet beds, electrical cords, etc?   yes      Grab bars in the bathroom? yes      Handrails on the stairs?   no single level home      Adequate lighting?   yes  Depression Screen PHQ 2/9 Scores 05/29/2017 03/13/2016 07/20/2015 09/14/2013  PHQ - 2 Score 0 0 0 0  PHQ- 9 Score - - 7 6     Cognitive Function MMSE - Mini Mental State Exam 04/08/2014  Orientation to time 5  Orientation to Place 5  Registration 3  Attention/ Calculation 5  Recall 2  Language- name 2 objects 2  Language- repeat 1  Language- follow 3 step command 3  Language- read & follow direction 1  Write a sentence  1  Copy design 1  Total score 29     6CIT Screen 05/29/2017 03/13/2016  What Year? 0 points 0 points  What month? 0 points 0 points  What time? 0 points 0 points  Count back from 20 0 points 0 points  Months in reverse 0 points 0 points  Repeat phrase 0 points 0 points  Total Score 0 0    Immunization History  Administered Date(s) Administered  . H1N1 01/01/2008  . Influenza Split 10/24/2011  . Influenza Whole 11/04/2006, 10/28/2007, 11/04/2008, 11/07/2009, 10/11/2010  . Influenza,inj,Quad PF,6+ Mos 10/28/2012, 12/16/2013, 11/30/2014, 11/09/2015, 11/05/2016  . Pneumococcal Conjugate-13 08/05/2013  . Pneumococcal Polysaccharide-23 06/07/2004, 11/07/2009, 07/20/2015  . Td 09/21/2002  . Tdap 12/16/2013    Qualifies for Shingles Vaccine?Patient will call her insurance company to see if this is a covered vaccine.   Screening Tests Health Maintenance  Topic Date Due  . OPHTHALMOLOGY EXAM  10/04/2016  . INFLUENZA VACCINE  08/29/2017  . HEMOGLOBIN A1C  09/15/2017  . URINE MICROALBUMIN  02/05/2018  . FOOT EXAM  03/18/2018  . MAMMOGRAM  02/19/2019  . TETANUS/TDAP  12/17/2023  . COLONOSCOPY  08/25/2026  . DEXA SCAN  Completed  . Hepatitis C Screening  Completed  . PNA vac Low Risk Adult  Completed    Cancer Screenings: Lung: Low Dose CT Chest recommended if Age 51-80 years, 30 pack-year currently smoking OR have quit w/in 15years. Patient does not qualify. Breast:  Up to date on Mammogram? Yes   Up to date of Bone Density/Dexa? Yes Colorectal: UTD  Additional Screenings:  Hepatitis C Screening: Completed on June 21,2017     Plan:    I have personally reviewed and noted the following in the patient's chart:   . Medical and social history . Use of alcohol, tobacco or illicit drugs  . Current medications and supplements . Functional ability and status . Nutritional status . Physical activity . Advanced directives . List of other physicians . Hospitalizations,  surgeries, and ER visits in previous 12 months . Vitals . Screenings to include cognitive, depression, and falls . Referrals and appointments  In addition, I have reviewed and discussed with patient certain preventive protocols, quality metrics, and best practice recommendations. A written personalized care plan for preventive services as well as general preventive health recommendations were provided to patient.     Tod Persia, [place]  05/29/2017

## 2017-06-03 ENCOUNTER — Ambulatory Visit: Payer: PPO

## 2017-06-06 ENCOUNTER — Other Ambulatory Visit: Payer: Self-pay

## 2017-06-06 ENCOUNTER — Emergency Department (HOSPITAL_COMMUNITY): Payer: PPO

## 2017-06-06 ENCOUNTER — Other Ambulatory Visit: Payer: Self-pay | Admitting: Family Medicine

## 2017-06-06 ENCOUNTER — Emergency Department (HOSPITAL_COMMUNITY)
Admission: EM | Admit: 2017-06-06 | Discharge: 2017-06-06 | Disposition: A | Discharge status: Home / Self Care | Payer: PPO | Attending: Emergency Medicine | Admitting: Emergency Medicine

## 2017-06-06 ENCOUNTER — Encounter (HOSPITAL_COMMUNITY): Payer: Self-pay | Admitting: *Deleted

## 2017-06-06 ENCOUNTER — Other Ambulatory Visit: Payer: Self-pay | Admitting: Endocrinology

## 2017-06-06 DIAGNOSIS — Z87891 Personal history of nicotine dependence: Secondary | ICD-10-CM | POA: Insufficient documentation

## 2017-06-06 DIAGNOSIS — Z8542 Personal history of malignant neoplasm of other parts of uterus: Secondary | ICD-10-CM | POA: Insufficient documentation

## 2017-06-06 DIAGNOSIS — S79912A Unspecified injury of left hip, initial encounter: Secondary | ICD-10-CM | POA: Diagnosis not present

## 2017-06-06 DIAGNOSIS — Z794 Long term (current) use of insulin: Secondary | ICD-10-CM | POA: Diagnosis not present

## 2017-06-06 DIAGNOSIS — M25559 Pain in unspecified hip: Secondary | ICD-10-CM

## 2017-06-06 DIAGNOSIS — E1122 Type 2 diabetes mellitus with diabetic chronic kidney disease: Secondary | ICD-10-CM | POA: Insufficient documentation

## 2017-06-06 DIAGNOSIS — N183 Chronic kidney disease, stage 3 (moderate): Secondary | ICD-10-CM | POA: Insufficient documentation

## 2017-06-06 DIAGNOSIS — I129 Hypertensive chronic kidney disease with stage 1 through stage 4 chronic kidney disease, or unspecified chronic kidney disease: Secondary | ICD-10-CM | POA: Diagnosis not present

## 2017-06-06 DIAGNOSIS — Z79899 Other long term (current) drug therapy: Secondary | ICD-10-CM | POA: Diagnosis not present

## 2017-06-06 DIAGNOSIS — M25552 Pain in left hip: Secondary | ICD-10-CM | POA: Insufficient documentation

## 2017-06-06 DIAGNOSIS — Z8673 Personal history of transient ischemic attack (TIA), and cerebral infarction without residual deficits: Secondary | ICD-10-CM | POA: Insufficient documentation

## 2017-06-06 MED ORDER — HYDROCODONE-ACETAMINOPHEN 5-325 MG PO TABS: 1.0000 | ORAL_TABLET | ORAL | 0 refills | Status: DC | PRN

## 2017-06-06 MED ORDER — HYDROCODONE-ACETAMINOPHEN 5-325 MG PO TABS
2.0000 | ORAL_TABLET | Freq: Once | ORAL | Status: AC
  Administered 2017-06-06: 2 via ORAL
  Filled 2017-06-06: qty 2

## 2017-06-06 NOTE — ED Triage Notes (Signed)
Pt reports falling last week injuring her left hip.

## 2017-06-06 NOTE — Discharge Instructions (Signed)
Return if any problems.  See your Physicain for recheck  

## 2017-06-06 NOTE — ED Provider Notes (Signed)
[facility] EMERGENCY DEPARTMENT Provider Note   CSN: [id number] Arrival date & time: 06/06/17  2036     History   Chief Complaint Chief Complaint  Patient presents with  . Hip Pain    HPI NORINNE JEANE is a 67 y.o. female.  The history is provided by the patient. No language interpreter was used.  Hip Pain  This is a new problem. The current episode started more than 1 week ago. The problem occurs hourly. The problem has been gradually worsening. Pertinent negatives include no chest pain and no abdominal pain. Nothing aggravates the symptoms. Nothing relieves the symptoms. She has tried nothing for the symptoms. The treatment provided no relief.  Pt reports she tried to sit an ottoman and missed.  Pt reports she hit the floor.  Pt reports pain since fall.  Pain in left hip with movement.  Pain with walking   Past Medical History:  Diagnosis Date  . Arthritis   . Cancer ([place])    uterine  . Chronic kidney disease   . Coronary artery disease   . Diabetes mellitus, type 2 ([place])   . GERD (gastroesophageal reflux disease)    no medication in 2017  . Gout   . History of MRSA infection 03/2009   pt denies this  . Hypercalcemia 2017   managed by nephrology  . Hyperlipidemia   . Hypertension   . Obesity   . Psoriasis   . Stroke [facility]) 2013   "light"    Patient Active Problem List   Diagnosis Date Noted  . Special screening for malignant neoplasms, colon 07/06/2016  . Diabetic neuropathy ([place]) 11/09/2015  . Annual physical exam 12/16/2013  . Multinodular goiter (nontoxic) 09/20/2013  . Hypertriglyceridemia 08/05/2013  . Diabetes mellitus, insulin dependent (IDDM), controlled ([place]) 12/07/2012  . 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  . CVA (cerebral vascular accident) ([place]) 07/23/2011  . CKD (chronic kidney disease) stage 3, GFR 30-59 ml/min (HCC) 11/07/2009  . SLEEP APNEA 09/06/2009  . SKIN TAG 07/14/2009  . Hyperuricemia 11/04/2008  . Hyperlipidemia LDL goal <100  07/02/2007  . Morbid obesity ([place]) 07/02/2007  . Essential hypertension 07/02/2007    Past Surgical History:  Procedure Laterality Date  . ABDOMINAL HYSTERECTOMY    . APPENDECTOMY  2012  . COLONOSCOPY WITH PROPOFOL N/A 08/24/2016   Procedure: COLONOSCOPY WITH PROPOFOL;  Surgeon: Rogene Houston, MD;  Location: [facility] ENDO SUITE;  Service: Endoscopy;  Laterality: N/A;  10:30  . excison of rt breast cyst    . LAPAROSCOPIC SALPINGO OOPHERECTOMY Right 04/22/2012   Procedure: LAPAROSCOPIC SALPINGO OOPHORECTOMY;  Surgeon: Jonnie Kind, MD;  Location: [facility] ORS;  Service: Gynecology;  Laterality: Right;  end 11:17  . MASS EXCISION N/A 04/22/2012   Procedure: EXCISION SKIN TAGS NECK AND HEAD;  Surgeon: Jonnie Kind, MD;  Location: [facility] ORS;  Service: Gynecology;  Laterality: N/A;  start 11:19  . PARTIAL HYSTERECTOMY    . POLYPECTOMY  08/24/2016   Procedure: POLYPECTOMY;  Surgeon: Rogene Houston, MD;  Location: [facility] ENDO SUITE;  Service: Endoscopy;;  colon  . rt,. neck biopsy       OB History    Gravida  1   Para  1   Term      Preterm      AB      Living        SAB      TAB      Ectopic  Multiple      Live Births               Home Medications    Prior to Admission medications   Medication Sig Start Date End Date Taking? Authorizing Provider  allopurinol (ZYLOPRIM) 300 MG tablet Take 1 tablet (300 mg total) by mouth daily. 04/08/17   Carole Civil, MD  aspirin 325 MG tablet Take 1 tablet (325 mg total) by mouth daily. 08/25/16   Rehman, Mechele Dawley, MD  calcitRIOL (ROCALTROL) 0.25 MCG capsule Take 0.25 mcg by mouth daily.    Fran Lowes, MD  cyclobenzaprine (FLEXERIL) 10 MG tablet Take 10 mg by mouth 3 (three) times daily as needed for muscle spasms.     [provider]  DILT-XR 180 MG 24 hr capsule TAKE 1 CAPSULE BY MOUTH ONCE DAILY 12/10/16   Fayrene Helper, MD  docusate sodium (COLACE) 100 MG capsule Take 100 mg by mouth 2 (two) times daily.  Constipation    [provider]  ezetimibe (ZETIA) 10 MG tablet TAKE 1 TABLET BY MOUTH ONCE DAILY 03/12/17   Fayrene Helper, MD  gabapentin (NEURONTIN) 300 MG capsule Take 300 mg by mouth 2 (two) times daily.    [provider]  HYDROcodone-acetaminophen (NORCO/VICODIN) 5-325 MG tablet Take 1 tablet by mouth every 4 (four) hours as needed. 06/06/17   Fransico Meadow, PA-C  insulin NPH-regular Human (NOVOLIN 70/30) (70-30) 100 UNIT/ML injection Inject 180 Units into the skin daily with breakfast. 11/14/16   Renato Shin, MD  insulin regular (NOVOLIN R RELION) 100 units/mL injection Inject 0.25-0.35 mLs (25-35 Units total) into the skin daily with supper. 11/14/16   Renato Shin, MD  metolazone (ZAROXOLYN) 5 MG tablet TAKE 1 TABLET BY MOUTH ONCE DAILY 12/10/16   Fayrene Helper, MD  metoprolol tartrate (LOPRESSOR) 100 MG tablet TAKE 1 TABLET BY MOUTH TWICE DAILY 12/10/16   Fayrene Helper, MD  midodrine (PROAMATINE) 5 MG tablet Take 5 mg by mouth 2 (two) times daily.  07/01/13   [provider]  ONE TOUCH ULTRA TEST test strip USE ONE STRIP TO CHECK GLUCOSE TWICE DAILY 03/12/17   Renato Shin, MD  [facility] DELICA LANCETS 34V MISC 1 Device by Does not apply route 2 (two) times daily. 04/06/14   Renato Shin, MD  potassium chloride (K-DUR) 10 MEQ tablet Take 3 tablets (30 mEq total) by mouth 3 (three) times daily. 04/26/17   Fayrene Helper, MD  pravastatin (PRAVACHOL) 20 MG tablet TAKE 1 TABLET BY MOUTH ONCE DAILY WITH BREAKFAST 04/01/17   Fayrene Helper, MD  RELION INSULIN SYR 1CC/30G 30G X 5/16" 1 ML MISC USE ONE SYRINGE TO INJECT INSULIN SUBCUTANEOUSLY THREE TIMES DAILY AS DIRECTED 07/26/15   Renato Shin, MD  RELION INSULIN SYRINGE 31G X 15/64" 1 ML MISC USE 1  THREE TIMES DAILY AS DIRECTED 06/06/17   Renato Shin, MD  spironolactone (ALDACTONE) 25 MG tablet TAKE 1 TABLET BY MOUTH ONCE DAILY 05/10/17   Fayrene Helper, MD  torsemide (DEMADEX) 20 MG tablet  Take 80 mg by mouth daily.  03/25/15   [provider]    Family History Family History  Problem Relation Age of Onset  . Hypertension Brother   . Gout Brother   . Prostate cancer Brother   . Hypertension Brother   . Prostate cancer Brother   . Gout Brother   . Hypertension Sister   . Gout Sister   . Cancer Sister 102  pancreatic   . Leukemia Sister 103  . Gout Sister   . Prostate cancer Brother   . Diabetes Neg Hx     Social History Social History   Tobacco Use  . Smoking status: Former Smoker    Packs/day: 0.25    Years: 12.00    Pack years: 3.00    Last attempt to quit: 07/16/1978    Years since quitting: 38.9  . Smokeless tobacco: Never Used  Substance Use Topics  . Alcohol use: No  . Drug use: No     Allergies   Benazepril; Metronidazole; Mobic [meloxicam]; Penicillins; and Sulfonamide derivatives   Review of Systems Review of Systems  Cardiovascular: Negative for chest pain.  Gastrointestinal: Negative for abdominal pain.  All other systems reviewed and are negative.    Physical Exam Updated Vital Signs BP (!) 169/68 (BP Location: Right Arm)   Pulse 68   Temp 98.3 F (36.8 C) (Oral)   Resp 16   Ht 5\' 6"  (1.676 m)   Wt 92.1 kg (203 lb)   SpO2 98%   BMI 32.77 kg/m   Physical Exam  Constitutional: She appears well-developed and well-nourished.  HENT:  Head: Normocephalic.  Cardiovascular: Normal rate.  Pulmonary/Chest: Effort normal.  Abdominal: Soft.  Musculoskeletal: She exhibits tenderness.  Tender left hip,  Pain with movement,      Neurological: She is alert.  Skin: Skin is warm.  Psychiatric: She has a normal mood and affect.  Nursing note and vitals reviewed.    ED Treatments / Results  Labs (all labs ordered are listed, but only abnormal results are displayed) Labs Reviewed - No data to display  EKG None  Radiology Dg Hip Unilat With Pelvis 2-3 Views Left  Result Date: 06/06/2017 CLINICAL DATA:  LEFT hip  pain for 1 week.  Fell. EXAM: DG HIP (WITH OR WITHOUT PELVIS) 2-3V LEFT COMPARISON:  None. FINDINGS: There is no evidence of hip fracture or dislocation. There is no evidence of arthropathy or other focal bone abnormality. IMPRESSION: Negative. Electronically Signed   By: Staci Righter M.D.   On: 06/06/2017 21:40    Procedures Procedures (including critical care time)  Medications Ordered in ED Medications  HYDROcodone-acetaminophen (NORCO/VICODIN) 5-325 MG per tablet 2 tablet (2 tablets Oral Given 06/06/17 2158)     Initial Impression / Assessment and Plan / ED Course  I have reviewed the triage vital signs and the nursing notes.  Pertinent labs & imaging results that were available during my care of the patient were reviewed by me and considered in my medical decision making (see chart for details).     xrays reviewed, no fracture.  Pt counseled on follow up   Final Clinical Impressions(s) / ED Diagnoses   Final diagnoses:  Left hip pain    ED Discharge Orders        Ordered    HYDROcodone-acetaminophen (NORCO/VICODIN) 5-325 MG tablet  Every 4 hours PRN     06/06/17 2152    An After Visit Summary was printed and given to the patient.    Sidney Ace 06/06/17 2259    Davonna Belling, MD 06/07/17 [phone]

## 2017-06-11 ENCOUNTER — Other Ambulatory Visit: Payer: Self-pay

## 2017-06-11 MED ORDER — DILTIAZEM HCL ER 180 MG PO CP24: 180.0000 mg | ORAL_CAPSULE | Freq: Every day | ORAL | 1 refills | Status: DC

## 2017-06-14 ENCOUNTER — Encounter: Payer: Self-pay | Admitting: Endocrinology

## 2017-06-14 ENCOUNTER — Ambulatory Visit (INDEPENDENT_AMBULATORY_CARE_PROVIDER_SITE_OTHER): Payer: PPO | Admitting: Endocrinology

## 2017-06-14 VITALS — BP 162/70 | HR 72 | Wt 203.0 lb

## 2017-06-14 DIAGNOSIS — IMO0001 Reserved for inherently not codable concepts without codable children: Secondary | ICD-10-CM

## 2017-06-14 DIAGNOSIS — Z794 Long term (current) use of insulin: Secondary | ICD-10-CM

## 2017-06-14 DIAGNOSIS — E119 Type 2 diabetes mellitus without complications: Secondary | ICD-10-CM

## 2017-06-14 LAB — POCT GLYCOSYLATED HEMOGLOBIN (HGB A1C): HEMOGLOBIN A1C: 8.1

## 2017-06-14 MED ORDER — INSULIN REGULAR HUMAN 100 UNIT/ML IJ SOLN: 40.0000 [IU] | Freq: Every day | INTRAMUSCULAR | 4 refills | Status: DC

## 2017-06-14 NOTE — Patient Instructions (Addendum)
check your blood sugar twice a day.  vary the time of day when you check, between before the 3 meals, and at bedtime.  also check if you have symptoms of your blood sugar being too high or too low.  please keep a record of the readings and bring it to your next appointment here.  You can write it on any piece of paper.  please call us sooner if your blood sugar goes below 70, or if you have a lot of readings over 200.   Please continue the morning insulin, 180 units.  However, take just 140 units on Sunday morning, and:  Please increase the evening regular insulin to 40 units with supper.   On this type of insulin schedule, you should eat meals on a regular schedule.  If a meal is missed or significantly delayed, your blood sugar could go low.   Please come back for a follow-up appointment in 3 months.

## 2017-06-14 NOTE — Progress Notes (Signed)
Subjective:    Patient ID: Krista Strickland, female    DOB: 04-30-1950, 67 y.o.   MRN: [id number]  HPI Pt returns for f/u of diabetes.   DM type: insulin-requiring type 2 Dx'ed: 4098 Complications: polyneuropathy, renal insufficiency and CVA.  Therapy: insulin since 2010 GDM: never DKA: never Severe hypoglycemia: never.   Pancreatitis: never.   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 she cannot afford analogs, so we had to try 70/30 (the NPH caused no reaction this time).  She takes 70/30 QAM and reg QPM, due to the pattern of cbg's.   Interval history:  no cbg record, but states cbg is highest at HS.  pt states she feels well in general.   Pt also has small multinodular goiter (euthyroid; f/u US in 2017 showed no change; she is euthyroid off rx).  Past Medical History:  Diagnosis Date  . Arthritis   . Cancer ([place])    uterine  . Chronic kidney disease   . Coronary artery disease   . Diabetes mellitus, type 2 ([place])   . GERD (gastroesophageal reflux disease)    no medication in 2017  . Gout   . History of MRSA infection 03/2009   pt denies this  . Hypercalcemia 2017   managed by nephrology  . Hyperlipidemia   . Hypertension   . Obesity   . Psoriasis   . Stroke [facility]) 2013   "light"    Past Surgical History:  Procedure Laterality Date  . ABDOMINAL HYSTERECTOMY    . APPENDECTOMY  2012  . COLONOSCOPY WITH PROPOFOL N/A 08/24/2016   Procedure: COLONOSCOPY WITH PROPOFOL;  Surgeon: Rogene Houston, MD;  Location: [facility] ENDO SUITE;  Service: Endoscopy;  Laterality: N/A;  10:30  . excison of rt breast cyst    . LAPAROSCOPIC SALPINGO OOPHERECTOMY Right 04/22/2012   Procedure: LAPAROSCOPIC SALPINGO OOPHORECTOMY;  Surgeon: Jonnie Kind, MD;  Location: [facility] ORS;  Service: Gynecology;  Laterality: Right;  end 11:17  . MASS EXCISION N/A 04/22/2012   Procedure: EXCISION SKIN TAGS NECK AND HEAD;   Surgeon: Jonnie Kind, MD;  Location: [facility] ORS;  Service: Gynecology;  Laterality: N/A;  start 11:19  . PARTIAL HYSTERECTOMY    . POLYPECTOMY  08/24/2016   Procedure: POLYPECTOMY;  Surgeon: Rogene Houston, MD;  Location: [facility] ENDO SUITE;  Service: Endoscopy;;  colon  . rt,. neck biopsy      Social History   Socioeconomic History  . Marital status: Married    Spouse name: Not on file  . Number of children: 1  . Years of education: Not on file  . Highest education level: 11th grade  Occupational History  . Occupation: disabled     Fish farm manager: UNEMPLOYED  Social Needs  . Financial resource strain: Not very hard  . Food insecurity:    Worry: Never true    Inability: Never true  . Transportation needs:    Medical: No    Non-medical: No  Tobacco Use  . Smoking status: Former Smoker    Packs/day: 0.25    Years: 12.00    Pack years: 3.00    Last attempt to quit: 07/16/1978    Years since quitting: 38.9  . Smokeless tobacco: Never Used  Substance and Sexual Activity  . Alcohol use: No  . Drug use: No  . Sexual activity: Yes    Birth control/protection: Surgical  Lifestyle  . Physical  activity:    Days per week: 0 days    Minutes per session: 0 min  . Stress: Not at all  Relationships  . Social connections:    Talks on phone: Twice a week    Gets together: Once a week    Attends religious service: More than 4 times per year    Active member of club or organization: No    Attends meetings of clubs or organizations: Never    Relationship status: Married  . Intimate partner violence:    Fear of current or ex partner: No    Emotionally abused: No    Physically abused: No    Forced sexual activity: No  Other Topics Concern  . Not on file  Social History Narrative  . Not on file    Current Outpatient Medications on File Prior to Visit  Medication Sig Dispense Refill  . allopurinol (ZYLOPRIM) 300 MG tablet Take 1 tablet (300 mg total) by mouth daily. 90 tablet 1  .  aspirin 325 MG tablet Take 1 tablet (325 mg total) by mouth daily.    . calcitRIOL (ROCALTROL) 0.25 MCG capsule Take 0.25 mcg by mouth daily.    . cyclobenzaprine (FLEXERIL) 10 MG tablet Take 10 mg by mouth 3 (three) times daily as needed for muscle spasms.     Marland Kitchen diltiazem (DILT-XR) 180 MG 24 hr capsule Take 1 capsule (180 mg total) by mouth daily. 90 capsule 1  . docusate sodium (COLACE) 100 MG capsule Take 100 mg by mouth 2 (two) times daily. Constipation    . ezetimibe (ZETIA) 10 MG tablet TAKE 1 TABLET BY MOUTH ONCE DAILY 90 tablet 1  . gabapentin (NEURONTIN) 300 MG capsule Take 300 mg by mouth 2 (two) times daily.    Marland Kitchen HYDROcodone-acetaminophen (NORCO/VICODIN) 5-325 MG tablet Take 1 tablet by mouth every 4 (four) hours as needed. 10 tablet 0  . insulin NPH-regular Human (NOVOLIN 70/30) (70-30) 100 UNIT/ML injection Inject 180 Units into the skin daily with breakfast. 60 mL 11  . metolazone (ZAROXOLYN) 5 MG tablet TAKE 1 TABLET BY MOUTH ONCE DAILY 90 tablet 1  . metoprolol tartrate (LOPRESSOR) 100 MG tablet TAKE 1 TABLET BY MOUTH TWICE DAILY 180 tablet 1  . midodrine (PROAMATINE) 5 MG tablet Take 5 mg by mouth 2 (two) times daily.     . ONE TOUCH ULTRA TEST test strip USE ONE STRIP TO CHECK GLUCOSE TWICE DAILY 100 each 4  . ONETOUCH DELICA LANCETS 16W MISC 1 Device by Does not apply route 2 (two) times daily. 60 each 11  . potassium chloride (K-DUR) 10 MEQ tablet Take 3 tablets (30 mEq total) by mouth 3 (three) times daily. 270 tablet 1  . pravastatin (PRAVACHOL) 20 MG tablet TAKE 1 TABLET BY MOUTH ONCE DAILY WITH BREAKFAST 90 tablet 1  . RELION INSULIN SYR 1CC/30G 30G X 5/16" 1 ML MISC USE ONE SYRINGE TO INJECT INSULIN SUBCUTANEOUSLY THREE TIMES DAILY AS DIRECTED 150 each 0  . RELION INSULIN SYRINGE 31G X 15/64" 1 ML MISC USE 1  THREE TIMES DAILY AS DIRECTED 150 each 0  . spironolactone (ALDACTONE) 25 MG tablet TAKE 1 TABLET BY MOUTH ONCE DAILY 90 tablet 1  . torsemide (DEMADEX) 20 MG tablet  Take 80 mg by mouth daily.      No current facility-administered medications on file prior to visit.     Allergies  Allergen Reactions  . Benazepril Swelling  . Metronidazole Hives  . Mobic [Meloxicam] Hives  .  Penicillins Hives and Swelling    Has patient had a PCN reaction causing immediate rash, facial/tongue/throat swelling, SOB or lightheadedness with hypotension: No Has patient had a PCN reaction causing severe rash involving mucus membranes or skin necrosis: Yes Has patient had a PCN reaction that required hospitalization: No Has patient had a PCN reaction occurring within the last 10 years: No If all of the above answers are "NO", then may proceed with Cephalosporin use.   . Sulfonamide Derivatives Hives    Family History  Problem Relation Age of Onset  . Hypertension Brother   . Gout Brother   . Prostate cancer Brother   . Hypertension Brother   . Prostate cancer Brother   . Gout Brother   . Hypertension Sister   . Gout Sister   . Cancer Sister 37       pancreatic   . Leukemia Sister 49  . Gout Sister   . Prostate cancer Brother   . Diabetes Neg Hx     BP (!) 162/70   Pulse 72   Wt 203 lb (92.1 kg)   SpO2 97%   BMI 32.77 kg/m    Review of Systems She denies hypoglycemia    Objective:   Physical Exam VITAL SIGNS:  See vs page GENERAL: no distress.  Pulses: foot pulses are intact bilaterally.   MSK: no deformity of the feet or ankles.  CV: 1+ bilat edema of the legs.  Skin:  no ulcer on the feet or ankles.  normal color and temp on the feet and ankles.  Neuro: sensation is intact to touch on the feet and ankles.   Ext: There is bilateral onychomycosis of the toenails.    Lab Results  Component Value Date   HGBA1C 8.1 06/14/2017   Lab Results  Component Value Date   CREATININE 2.00 (H) 02/01/2017   BUN 75 (H) 02/01/2017   NA 141 02/01/2017   K 3.7 02/01/2017   CL 98 02/01/2017   CO2 32 02/01/2017      Assessment & Plan:    Insulin-requiring type 2 DM, with polyneuropathy: worse Renal insuff: she needs a faster-acting qd insulin.   Patient Instructions  check your blood sugar twice a day.  vary the time of day when you check, between before the 3 meals, and at bedtime.  also check if you have symptoms of your blood sugar being too high or too low.  please keep a record of the readings and bring it to your next appointment here.  You can write it on any piece of paper.  please call us sooner if your blood sugar goes below 70, or if you have a lot of readings over 200.   Please continue the morning insulin, 180 units.  However, take just 140 units on Sunday morning, and:  Please increase the evening regular insulin to 40 units with supper.   On this type of insulin schedule, you should eat meals on a regular schedule.  If a meal is missed or significantly delayed, your blood sugar could go low.   Please come back for a follow-up appointment in 3 months.

## 2017-07-08 DIAGNOSIS — M79674 Pain in right toe(s): Secondary | ICD-10-CM | POA: Diagnosis not present

## 2017-07-08 DIAGNOSIS — B351 Tinea unguium: Secondary | ICD-10-CM | POA: Diagnosis not present

## 2017-07-08 DIAGNOSIS — M79675 Pain in left toe(s): Secondary | ICD-10-CM | POA: Diagnosis not present

## 2017-07-08 DIAGNOSIS — E1140 Type 2 diabetes mellitus with diabetic neuropathy, unspecified: Secondary | ICD-10-CM | POA: Diagnosis not present

## 2017-07-29 ENCOUNTER — Other Ambulatory Visit: Payer: Self-pay | Admitting: Endocrinology

## 2017-09-02 ENCOUNTER — Other Ambulatory Visit: Payer: Self-pay

## 2017-09-02 ENCOUNTER — Encounter: Payer: Self-pay | Admitting: Family Medicine

## 2017-09-02 ENCOUNTER — Ambulatory Visit (INDEPENDENT_AMBULATORY_CARE_PROVIDER_SITE_OTHER): Payer: PPO | Admitting: Family Medicine

## 2017-09-02 VITALS — BP 128/78 | HR 68 | Resp 12 | Ht 63.0 in | Wt 201.1 lb

## 2017-09-02 DIAGNOSIS — I10 Essential (primary) hypertension: Secondary | ICD-10-CM

## 2017-09-02 DIAGNOSIS — E1140 Type 2 diabetes mellitus with diabetic neuropathy, unspecified: Secondary | ICD-10-CM | POA: Diagnosis not present

## 2017-09-02 DIAGNOSIS — E790 Hyperuricemia without signs of inflammatory arthritis and tophaceous disease: Secondary | ICD-10-CM | POA: Diagnosis not present

## 2017-09-02 DIAGNOSIS — E785 Hyperlipidemia, unspecified: Secondary | ICD-10-CM | POA: Diagnosis not present

## 2017-09-02 DIAGNOSIS — Z794 Long term (current) use of insulin: Secondary | ICD-10-CM

## 2017-09-02 NOTE — Progress Notes (Signed)
Krista Strickland     MRN: [id number]      DOB: 12-25-1950   HPI Ms. Krista Strickland is here for follow up and re-evaluation of chronic medical conditions, medication management and review of any available recent lab and radiology data.  Preventive health is updated, specifically  Cancer screening and Immunization.   Questions or concerns regarding consultations or procedures which the PT has had in the interim are  addressed. The PT denies any adverse reactions to current medications since the last visit.  C/o increased and uncontrolled foot pain esp at night rated at 10, currently being treated by podiatry for foot pain, takes gabapentin 600 mg in the morning  And 1 at bedtime, will recommend increase the bedtime bedtime dose to two at night C/o uncontrolled and elevated blood sugar , denies hypoglycemic episodes  ROS Denies recent fever or chills. Denies sinus pressure, nasal congestion, ear pain or sore throat. Denies chest congestion, productive cough or wheezing. Denies chest pains, palpitations and leg swelling Denies abdominal pain, nausea, vomiting,diarrhea or constipation.   Denies dysuria, frequency, hesitancy or incontinence. Denies headaches, seizures, Denies depression, anxiety or insomnia. Denies skin break down or rash.   PE BP 128/78   Pulse 68   Resp 12   Ht 5\' 3"  (1.6 m)   Wt 201 lb 1.9 oz (91.2 kg)   SpO2 96% Comment: room air  BMI 35.63 kg/m   Patient alert and oriented and in no cardiopulmonary distress.  HEENT: No facial asymmetry, EOMI,   oropharynx pink and moist.  Neck supple no JVD, no mass.  Chest: Clear to auscultation bilaterally.  CVS: S1, S2 no murmurs, no S3.Regular rate.  ABD: Soft non tender.   Ext: No edema  MS: Adequate ROM spine, shoulders, hips and knees.  Skin: Intact, no ulcerations or rash noted.  Psych: Good eye contact, normal affect. Memory intact not anxious or depressed appearing.  CNS: CN 2-12 intact, power,  normal  throughout.no focal deficits noted.   Assessment & Plan  Essential hypertension Controlled, no change in medication DASH diet and commitment to daily physical activity for a minimum of 30 minutes discussed and encouraged, as a part of hypertension management. The importance of attaining a healthy weight is also discussed.  BP/Weight 09/02/2017 06/14/2017 06/06/2017 05/29/2017 03/18/2017 02/04/2017 01/75/1025  Systolic BP 852 778 242 353 614 431 540  Diastolic BP 78 70 68 54 72 68 72  Wt. (Lbs) 201.12 203 203 203.04 200.4 201 201.2  BMI 35.63 32.77 32.77 32.77 35.5 35.61 35.64       Morbid obesity Unchanged. Patient re-educated about  the importance of commitment to a  minimum of 150 minutes of exercise per week.  The importance of healthy food choices with portion control discussed. Encouraged to start a food diary, count calories and to consider  joining a support group. Sample diet sheets offered. Goals set by the patient for the next several months.   Weight /BMI 09/02/2017 06/14/2017 06/06/2017  WEIGHT 201 lb 1.9 oz 203 lb 203 lb  HEIGHT 5\' 3"  - 5\' 6"   BMI 35.63 kg/m2 32.77 kg/m2 32.77 kg/m2      Hyperlipidemia LDL goal <100 Hyperlipidemia:Low fat diet discussed and encouraged.   Lipid Panel  Lab Results  Component Value Date   CHOL 117 09/07/2017   HDL 24 (L) 09/07/2017   LDLCALC 37 09/07/2017   TRIG 278 (H) 09/07/2017   CHOLHDL 4.9 (H) 09/07/2017   Uncontrolled with elevated TG, needs to lower  fat intake   Type 2 diabetes mellitus with diabetic neuropathy, unspecified ([place]) Uncontrolled, pt advised to rake the 2 gabapentin at bedtime and one in the am as her sym[ptoms are worse at night, she will f/u with podiatry who is treating

## 2017-09-02 NOTE — Patient Instructions (Addendum)
Annual physical exam Jan 8 or after  Change eating and  Please get blood sugar right  Start gabapentin 2 at bedtime and one in the morning  Fasting lipid, cmp and EGFR, CBC. And uric acid level to be drawn the same day as Dr. Liliane Channel  Lab ( [facility])   It is important that you exercise regularly at least 30 minutes 5 times a week. If you develop chest pain, have severe difficulty breathing, or feel very tired, stop exercising immediately and seek medical attention    Flu vaccines start this Friday  We will send for eye exam with Dr Gershon Crane  Weight loss goal of 6 to 8 pounds

## 2017-09-03 ENCOUNTER — Other Ambulatory Visit: Payer: Self-pay | Admitting: Family Medicine

## 2017-09-04 NOTE — Progress Notes (Signed)
Diabetic eye exam entered. Health Maintenance updated.

## 2017-09-07 ENCOUNTER — Other Ambulatory Visit: Payer: Self-pay | Admitting: Family Medicine

## 2017-09-07 DIAGNOSIS — Z79899 Other long term (current) drug therapy: Secondary | ICD-10-CM | POA: Diagnosis not present

## 2017-09-07 DIAGNOSIS — D509 Iron deficiency anemia, unspecified: Secondary | ICD-10-CM | POA: Diagnosis not present

## 2017-09-07 DIAGNOSIS — I10 Essential (primary) hypertension: Secondary | ICD-10-CM | POA: Diagnosis not present

## 2017-09-07 DIAGNOSIS — E790 Hyperuricemia without signs of inflammatory arthritis and tophaceous disease: Secondary | ICD-10-CM | POA: Diagnosis not present

## 2017-09-07 DIAGNOSIS — E785 Hyperlipidemia, unspecified: Secondary | ICD-10-CM | POA: Diagnosis not present

## 2017-09-07 DIAGNOSIS — E559 Vitamin D deficiency, unspecified: Secondary | ICD-10-CM | POA: Diagnosis not present

## 2017-09-07 DIAGNOSIS — N183 Chronic kidney disease, stage 3 (moderate): Secondary | ICD-10-CM | POA: Diagnosis not present

## 2017-09-07 DIAGNOSIS — R809 Proteinuria, unspecified: Secondary | ICD-10-CM | POA: Diagnosis not present

## 2017-09-08 ENCOUNTER — Encounter: Payer: Self-pay | Admitting: Family Medicine

## 2017-09-08 DIAGNOSIS — E1140 Type 2 diabetes mellitus with diabetic neuropathy, unspecified: Secondary | ICD-10-CM | POA: Insufficient documentation

## 2017-09-08 LAB — COMPREHENSIVE METABOLIC PANEL
A/G RATIO: 1.8 (ref 1.2–2.2)
ALBUMIN: 4.4 g/dL (ref 3.6–4.8)
ALK PHOS: 76 IU/L (ref 39–117)
ALT: 32 IU/L (ref 0–32)
AST: 21 IU/L (ref 0–40)
BILIRUBIN TOTAL: 0.3 mg/dL (ref 0.0–1.2)
BUN/Creatinine Ratio: 36 — ABNORMAL HIGH (ref 12–28)
BUN: 86 mg/dL (ref 8–27)
CO2: 22 mmol/L (ref 20–29)
Calcium: 10.4 mg/dL — ABNORMAL HIGH (ref 8.7–10.3)
Chloride: 96 mmol/L (ref 96–106)
Creatinine, Ser: 2.39 mg/dL — ABNORMAL HIGH (ref 0.57–1.00)
GFR calc non Af Amer: 20 mL/min/{1.73_m2} — ABNORMAL LOW (ref >59)
GFR, EST AFRICAN AMERICAN: 23 mL/min/{1.73_m2} — AB (ref >59)
GLOBULIN, TOTAL: 2.5 g/dL (ref 1.5–4.5)
Glucose: 214 mg/dL — ABNORMAL HIGH (ref 65–99)
POTASSIUM: 4.1 mmol/L (ref 3.5–5.2)
SODIUM: 139 mmol/L (ref 134–144)
TOTAL PROTEIN: 6.9 g/dL (ref 6.0–8.5)

## 2017-09-08 LAB — LIPID PANEL
CHOLESTEROL TOTAL: 117 mg/dL (ref 100–199)
Chol/HDL Ratio: 4.9 ratio — ABNORMAL HIGH (ref 0.0–4.4)
HDL: 24 mg/dL — ABNORMAL LOW (ref >39)
LDL CALC: 37 mg/dL (ref 0–99)
TRIGLYCERIDES: 278 mg/dL — AB (ref 0–149)
VLDL Cholesterol Cal: 56 mg/dL — ABNORMAL HIGH (ref 5–40)

## 2017-09-08 LAB — CBC
HEMATOCRIT: 41 % (ref 34.0–46.6)
HEMOGLOBIN: 13 g/dL (ref 11.1–15.9)
MCH: 29.3 pg (ref 26.6–33.0)
MCHC: 31.7 g/dL (ref 31.5–35.7)
MCV: 93 fL (ref 79–97)
Platelets: 215 10*3/uL (ref 150–450)
RBC: 4.43 x10E6/uL (ref 3.77–5.28)
RDW: 16.4 % — ABNORMAL HIGH (ref 12.3–15.4)
WBC: 7.5 10*3/uL (ref 3.4–10.8)

## 2017-09-08 LAB — SPECIMEN STATUS REPORT

## 2017-09-08 LAB — URIC ACID: Uric Acid: 6.5 mg/dL (ref 2.5–7.1)

## 2017-09-08 NOTE — Assessment & Plan Note (Signed)
Hyperlipidemia:Low fat diet discussed and encouraged.   Lipid Panel  Lab Results  Component Value Date   CHOL 117 09/07/2017   HDL 24 (L) 09/07/2017   LDLCALC 37 09/07/2017   TRIG 278 (H) 09/07/2017   CHOLHDL 4.9 (H) 09/07/2017   Uncontrolled with elevated TG, needs to lower fat intake

## 2017-09-08 NOTE — Assessment & Plan Note (Signed)
Uncontrolled, pt advised to rake the 2 gabapentin at bedtime and one in the am as her sym[ptoms are worse at night, she will f/u with podiatry who is treating

## 2017-09-08 NOTE — Assessment & Plan Note (Signed)
Controlled, no change in medication DASH diet and commitment to daily physical activity for a minimum of 30 minutes discussed and encouraged, as a part of hypertension management. The importance of attaining a healthy weight is also discussed.  BP/Weight 09/02/2017 06/14/2017 06/06/2017 05/29/2017 03/18/2017 02/04/2017 59/93/5701  Systolic BP 779 390 300 923 300 762 263  Diastolic BP 78 70 68 54 72 68 72  Wt. (Lbs) 201.12 203 203 203.04 200.4 201 201.2  BMI 35.63 32.77 32.77 32.77 35.5 35.61 35.64

## 2017-09-08 NOTE — Assessment & Plan Note (Signed)
Unchanged. Patient re-educated about  the importance of commitment to a  minimum of 150 minutes of exercise per week.  The importance of healthy food choices with portion control discussed. Encouraged to start a food diary, count calories and to consider  joining a support group. Sample diet sheets offered. Goals set by the patient for the next several months.   Weight /BMI 09/02/2017 06/14/2017 06/06/2017  WEIGHT 201 lb 1.9 oz 203 lb 203 lb  HEIGHT 5\' 3"  - 5\' 6"   BMI 35.63 kg/m2 32.77 kg/m2 32.77 kg/m2

## 2017-09-10 DIAGNOSIS — I10 Essential (primary) hypertension: Secondary | ICD-10-CM | POA: Diagnosis not present

## 2017-09-10 DIAGNOSIS — N184 Chronic kidney disease, stage 4 (severe): Secondary | ICD-10-CM | POA: Diagnosis not present

## 2017-09-10 DIAGNOSIS — E876 Hypokalemia: Secondary | ICD-10-CM | POA: Diagnosis not present

## 2017-09-10 DIAGNOSIS — D649 Anemia, unspecified: Secondary | ICD-10-CM | POA: Diagnosis not present

## 2017-09-10 DIAGNOSIS — E1121 Type 2 diabetes mellitus with diabetic nephropathy: Secondary | ICD-10-CM | POA: Diagnosis not present

## 2017-09-10 DIAGNOSIS — R809 Proteinuria, unspecified: Secondary | ICD-10-CM | POA: Diagnosis not present

## 2017-09-13 ENCOUNTER — Other Ambulatory Visit: Payer: Self-pay | Admitting: Family Medicine

## 2017-09-13 ENCOUNTER — Telehealth: Payer: Self-pay | Admitting: Family Medicine

## 2017-09-13 MED ORDER — EZETIMIBE 10 MG PO TABS: 10.0000 mg | ORAL_TABLET | Freq: Every day | ORAL | 3 refills | Status: DC

## 2017-09-13 NOTE — Progress Notes (Unsigned)
zetia 

## 2017-09-13 NOTE — Telephone Encounter (Signed)
noted 

## 2017-09-13 NOTE — Telephone Encounter (Signed)
I spoke with the pt directly  Re needs for zetia, I have re sent it today. I told her to call in if not covered/ too expensive, pleaseb e on the a;lert for this, she needs the medication!

## 2017-09-16 DIAGNOSIS — E1140 Type 2 diabetes mellitus with diabetic neuropathy, unspecified: Secondary | ICD-10-CM | POA: Diagnosis not present

## 2017-09-16 DIAGNOSIS — M79675 Pain in left toe(s): Secondary | ICD-10-CM | POA: Diagnosis not present

## 2017-09-16 DIAGNOSIS — B351 Tinea unguium: Secondary | ICD-10-CM | POA: Diagnosis not present

## 2017-09-16 DIAGNOSIS — M79674 Pain in right toe(s): Secondary | ICD-10-CM | POA: Diagnosis not present

## 2017-09-17 ENCOUNTER — Ambulatory Visit (INDEPENDENT_AMBULATORY_CARE_PROVIDER_SITE_OTHER): Payer: PPO | Admitting: Endocrinology

## 2017-09-17 ENCOUNTER — Encounter: Payer: Self-pay | Admitting: Endocrinology

## 2017-09-17 VITALS — BP 118/62 | HR 70 | Ht 63.0 in | Wt 202.4 lb

## 2017-09-17 DIAGNOSIS — IMO0001 Reserved for inherently not codable concepts without codable children: Secondary | ICD-10-CM

## 2017-09-17 DIAGNOSIS — E119 Type 2 diabetes mellitus without complications: Secondary | ICD-10-CM | POA: Diagnosis not present

## 2017-09-17 DIAGNOSIS — Z794 Long term (current) use of insulin: Secondary | ICD-10-CM | POA: Diagnosis not present

## 2017-09-17 LAB — POCT GLYCOSYLATED HEMOGLOBIN (HGB A1C): Hemoglobin A1C: 9.5 % — AB (ref 4.0–5.6)

## 2017-09-17 MED ORDER — INSULIN REGULAR HUMAN 100 UNIT/ML IJ SOLN: 60.0000 [IU] | Freq: Every day | INTRAMUSCULAR | 4 refills | Status: DC

## 2017-09-17 NOTE — Progress Notes (Signed)
Subjective:    Patient ID: Krista Strickland, female    DOB: 1950-05-12, 67 y.o.   MRN: [id number]  HPI Pt returns for f/u of diabetes.   DM type: insulin-requiring type 2 Dx'ed: 6269 Complications: polyneuropathy, renal insufficiency and CVA.  Therapy: insulin since 2010 GDM: never DKA: never Severe hypoglycemia: never.   Pancreatitis: never.   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 she cannot afford analogs, so we had to try 70/30 (the NPH caused no reaction this time).  She takes 70/30 QAM and reg QPM, due to the pattern of cbg's.   Interval history:  no cbg record, but states cbg's vary from 120-200's.  It is in general higher as the day goes on.  pt states she feels well in general.  She says she never misses the insulin. Pt also has small multinodular goiter (euthyroid; f/u US in 2017 showed no change; she is euthyroid off rx).   Past Medical History:  Diagnosis Date  . Arthritis   . Cancer ([place])    uterine  . Chronic kidney disease   . Coronary artery disease   . Diabetes mellitus, type 2 ([place])   . GERD (gastroesophageal reflux disease)    no medication in 2017  . Gout   . History of MRSA infection 03/2009   pt denies this  . Hypercalcemia 2017   managed by nephrology  . Hyperlipidemia   . Hypertension   . Obesity   . Psoriasis   . Stroke [facility]) 2013   "light"    Past Surgical History:  Procedure Laterality Date  . ABDOMINAL HYSTERECTOMY    . APPENDECTOMY  2012  . COLONOSCOPY WITH PROPOFOL N/A 08/24/2016   Procedure: COLONOSCOPY WITH PROPOFOL;  Surgeon: Rogene Houston, MD;  [facility];  Service: Endoscopy;  Laterality: N/A;  10:30  . excison of rt breast cyst    . LAPAROSCOPIC SALPINGO OOPHERECTOMY Right 04/22/2012   Procedure: LAPAROSCOPIC SALPINGO OOPHORECTOMY;  Surgeon: Jonnie Kind, MD;  Location: [facility] ORS;  Service: Gynecology;  Laterality: Right;  end  11:17  . MASS EXCISION N/A 04/22/2012   Procedure: EXCISION SKIN TAGS NECK AND HEAD;  Surgeon: Jonnie Kind, MD;  Location: [facility] ORS;  Service: Gynecology;  Laterality: N/A;  start 11:19  . PARTIAL HYSTERECTOMY    . POLYPECTOMY  08/24/2016   Procedure: POLYPECTOMY;  Surgeon: Rogene Houston, MD;  Location: [facility];  Service: Endoscopy;;  colon  . rt,. neck biopsy      Social History   Socioeconomic History  . Marital status: Married    Spouse name: Not on file  . Number of children: 1  . Years of education: Not on file  . Highest education level: 11th grade  Occupational History  . Occupation: disabled     Fish farm manager: UNEMPLOYED  Social Needs  . Financial resource strain: Not very hard  . Food insecurity:    Worry: Never true    Inability: Never true  . Transportation needs:    Medical: No    Non-medical: No  Tobacco Use  . Smoking status: Former Smoker    Packs/day: 0.25    Years: 12.00    Pack years: 3.00    Last attempt to quit: 07/16/1978    Years since quitting: 39.2  . Smokeless tobacco: Never Used  Substance and Sexual Activity  . Alcohol use: No  . Drug use:  No  . Sexual activity: Yes    Birth control/protection: Surgical  Lifestyle  . Physical activity:    Days per week: 0 days    Minutes per session: 0 min  . Stress: Not at all  Relationships  . Social connections:    Talks on phone: Twice a week    Gets together: Once a week    Attends religious service: More than 4 times per year    Active member of club or organization: No    Attends meetings of clubs or organizations: Never    Relationship status: Married  . Intimate partner violence:    Fear of current or ex partner: No    Emotionally abused: No    Physically abused: No    Forced sexual activity: No  Other Topics Concern  . Not on file  Social History Narrative  . Not on file    Current Outpatient Medications on File Prior to Visit  Medication Sig Dispense Refill  . allopurinol  (ZYLOPRIM) 300 MG tablet Take 1 tablet (300 mg total) by mouth daily. 90 tablet 1  . aspirin 325 MG tablet Take 1 tablet (325 mg total) by mouth daily.    . calcitRIOL (ROCALTROL) 0.25 MCG capsule Take 0.25 mcg by mouth 3 (three) times a week. Mondays, Wednesdays,Fridays    . cyclobenzaprine (FLEXERIL) 10 MG tablet Take 10 mg by mouth 3 (three) times daily as needed for muscle spasms.     Marland Kitchen diltiazem (DILT-XR) 180 MG 24 hr capsule Take 1 capsule (180 mg total) by mouth daily. 90 capsule 1  . docusate sodium (COLACE) 100 MG capsule Take 100 mg by mouth 2 (two) times daily. Constipation    . ezetimibe (ZETIA) 10 MG tablet Take 1 tablet (10 mg total) by mouth daily. 90 tablet 3  . gabapentin (NEURONTIN) 300 MG capsule Take 300 mg by mouth 3 (three) times daily.     . insulin NPH-regular Human (NOVOLIN 70/30) (70-30) 100 UNIT/ML injection Inject 180 Units into the skin daily with breakfast. 60 mL 11  . metolazone (ZAROXOLYN) 5 MG tablet TAKE 1 TABLET BY MOUTH ONCE DAILY 90 tablet 1  . metoprolol tartrate (LOPRESSOR) 100 MG tablet TAKE 1 TABLET BY MOUTH TWICE DAILY 180 tablet 1  . midodrine (PROAMATINE) 5 MG tablet Take 5 mg by mouth 2 (two) times daily.     . ONE TOUCH ULTRA TEST test strip USE ONE STRIP TO CHECK GLUCOSE TWICE DAILY 100 each 4  . ONETOUCH DELICA LANCETS 29N MISC 1 Device by Does not apply route 2 (two) times daily. 60 each 11  . potassium chloride (K-DUR) 10 MEQ tablet Take 3 tablets (30 mEq total) by mouth 3 (three) times daily. 270 tablet 1  . pravastatin (PRAVACHOL) 20 MG tablet TAKE 1 TABLET BY MOUTH ONCE DAILY WITH BREAKFAST 90 tablet 1  . RELION INSULIN SYR 1CC/30G 30G X 5/16" 1 ML MISC USE ONE SYRINGE TO INJECT INSULIN SUBCUTANEOUSLY THREE TIMES DAILY AS DIRECTED 150 each 0  . RELION INSULIN SYRINGE 31G X 15/64" 1 ML MISC USE 1 SYRINGE THREE TIMES DAILY AS DIRECTED 150 each 0  . spironolactone (ALDACTONE) 25 MG tablet TAKE 1 TABLET BY MOUTH ONCE DAILY 90 tablet 1  . torsemide  (DEMADEX) 20 MG tablet Take 80 mg by mouth daily.      No current facility-administered medications on file prior to visit.     Allergies  Allergen Reactions  . Benazepril Swelling  . Metronidazole Hives  .  Mobic [Meloxicam] Hives  . Penicillins Hives and Swelling    Has patient had a PCN reaction causing immediate rash, facial/tongue/throat swelling, SOB or lightheadedness with hypotension: No Has patient had a PCN reaction causing severe rash involving mucus membranes or skin necrosis: Yes Has patient had a PCN reaction that required hospitalization: No Has patient had a PCN reaction occurring within the last 10 years: No If all of the above answers are "NO", then may proceed with Cephalosporin use.   . Sulfonamide Derivatives Hives    Family History  Problem Relation Age of Onset  . Hypertension Brother   . Gout Brother   . Prostate cancer Brother   . Hypertension Brother   . Prostate cancer Brother   . Gout Brother   . Hypertension Sister   . Gout Sister   . Cancer Sister 72       pancreatic   . Leukemia Sister 67  . Gout Sister   . Prostate cancer Brother   . Diabetes Neg Hx     BP 118/62 (BP Location: Right Arm, Patient Position: Sitting, Cuff Size: Normal)   Pulse 70   Ht 5\' 3"  (1.6 m)   Wt 202 lb 6.4 oz (91.8 kg)   SpO2 96%   BMI 35.85 kg/m    Review of Systems She denies hypoglycemia.      Objective:   Physical Exam VITAL SIGNS:  See vs page GENERAL: no distress.  Pulses: foot pulses are intact bilaterally.   MSK: no deformity of the feet or ankles.  CV: 1+ bilat edema of the legs.  Skin:  no ulcer on the feet or ankles.  normal color and temp on the feet and ankles.  Neuro: sensation is intact to touch on the feet and ankles, but decreased from normal Ext: There is bilateral onychomycosis of the toenails.   Lab Results  Component Value Date   HGBA1C 9.5 (A) 09/17/2017       Assessment & Plan:  Insulin-requiring type 2 DM, with CVA: worse.    Renal failure: she needs most of her insulin in the morning.    Patient Instructions  check your blood sugar twice a day.  vary the time of day when you check, between before the 3 meals, and at bedtime.  also check if you have symptoms of your blood sugar being too high or too low.  please keep a record of the readings and bring it to your next appointment here.  You can write it on any piece of paper.  please call us sooner if your blood sugar goes below 70, or if you have a lot of readings over 200.   Please continue the same 70/30 insulin, 180 units with breakfast.  However, take just 140 units on Sunday morning, and:  Please increase the evening regular insulin to 60 units with supper.   On this type of insulin schedule, you should eat meals on a regular schedule.  If a meal is missed or significantly delayed, your blood sugar could go low.   Please come back for a follow-up appointment in 2 months.

## 2017-09-17 NOTE — Patient Instructions (Addendum)
check your blood sugar twice a day.  vary the time of day when you check, between before the 3 meals, and at bedtime.  also check if you have symptoms of your blood sugar being too high or too low.  please keep a record of the readings and bring it to your next appointment here.  You can write it on any piece of paper.  please call us sooner if your blood sugar goes below 70, or if you have a lot of readings over 200.   Please continue the same 70/30 insulin, 180 units with breakfast.  However, take just 140 units on Sunday morning, and:  Please increase the evening regular insulin to 60 units with supper.   On this type of insulin schedule, you should eat meals on a regular schedule.  If a meal is missed or significantly delayed, your blood sugar could go low.   Please come back for a follow-up appointment in 2 months.

## 2017-09-24 ENCOUNTER — Other Ambulatory Visit: Payer: Self-pay | Admitting: Family Medicine

## 2017-09-24 ENCOUNTER — Other Ambulatory Visit: Payer: Self-pay | Admitting: Orthopedic Surgery

## 2017-10-02 ENCOUNTER — Other Ambulatory Visit: Payer: Self-pay | Admitting: Endocrinology

## 2017-10-08 ENCOUNTER — Other Ambulatory Visit: Payer: Self-pay | Admitting: Family Medicine

## 2017-11-07 ENCOUNTER — Other Ambulatory Visit: Payer: Self-pay | Admitting: Endocrinology

## 2017-11-11 ENCOUNTER — Other Ambulatory Visit: Payer: Self-pay

## 2017-11-11 DIAGNOSIS — I951 Orthostatic hypotension: Secondary | ICD-10-CM | POA: Diagnosis not present

## 2017-11-11 DIAGNOSIS — I69898 Other sequelae of other cerebrovascular disease: Secondary | ICD-10-CM | POA: Diagnosis not present

## 2017-11-11 DIAGNOSIS — E1140 Type 2 diabetes mellitus with diabetic neuropathy, unspecified: Secondary | ICD-10-CM | POA: Diagnosis not present

## 2017-11-11 DIAGNOSIS — R2689 Other abnormalities of gait and mobility: Secondary | ICD-10-CM | POA: Diagnosis not present

## 2017-11-11 MED ORDER — INSULIN NPH ISOPHANE & REGULAR (70-30) 100 UNIT/ML ~~LOC~~ SUSP: SUBCUTANEOUS | 11 refills | Status: DC

## 2017-11-15 DIAGNOSIS — I10 Essential (primary) hypertension: Secondary | ICD-10-CM | POA: Diagnosis not present

## 2017-11-15 DIAGNOSIS — R809 Proteinuria, unspecified: Secondary | ICD-10-CM | POA: Diagnosis not present

## 2017-11-15 DIAGNOSIS — N183 Chronic kidney disease, stage 3 (moderate): Secondary | ICD-10-CM | POA: Diagnosis not present

## 2017-11-15 DIAGNOSIS — D509 Iron deficiency anemia, unspecified: Secondary | ICD-10-CM | POA: Diagnosis not present

## 2017-11-15 DIAGNOSIS — E559 Vitamin D deficiency, unspecified: Secondary | ICD-10-CM | POA: Diagnosis not present

## 2017-11-15 DIAGNOSIS — Z79899 Other long term (current) drug therapy: Secondary | ICD-10-CM | POA: Diagnosis not present

## 2017-11-18 ENCOUNTER — Ambulatory Visit (INDEPENDENT_AMBULATORY_CARE_PROVIDER_SITE_OTHER): Payer: PPO | Admitting: Endocrinology

## 2017-11-18 ENCOUNTER — Encounter: Payer: Self-pay | Admitting: Endocrinology

## 2017-11-18 VITALS — BP 140/60 | HR 72 | Ht 63.0 in | Wt 202.6 lb

## 2017-11-18 DIAGNOSIS — E119 Type 2 diabetes mellitus without complications: Secondary | ICD-10-CM | POA: Diagnosis not present

## 2017-11-18 DIAGNOSIS — Z794 Long term (current) use of insulin: Secondary | ICD-10-CM

## 2017-11-18 DIAGNOSIS — Z23 Encounter for immunization: Secondary | ICD-10-CM

## 2017-11-18 DIAGNOSIS — IMO0001 Reserved for inherently not codable concepts without codable children: Secondary | ICD-10-CM

## 2017-11-18 LAB — POCT GLYCOSYLATED HEMOGLOBIN (HGB A1C): Hemoglobin A1C: 9.2 % — AB (ref 4.0–5.6)

## 2017-11-18 MED ORDER — INSULIN REGULAR HUMAN 100 UNIT/ML IJ SOLN: 75.0000 [IU] | Freq: Every day | INTRAMUSCULAR | 11 refills | Status: DC

## 2017-11-18 MED ORDER — INSULIN NPH ISOPHANE & REGULAR (70-30) 100 UNIT/ML ~~LOC~~ SUSP: 170.0000 [IU] | Freq: Every day | SUBCUTANEOUS | 11 refills | Status: DC

## 2017-11-18 NOTE — Patient Instructions (Signed)
check your blood sugar twice a day.  vary the time of day when you check, between before the 3 meals, and at bedtime.  also check if you have symptoms of your blood sugar being too high or too low.  please keep a record of the readings and bring it to your next appointment here.  You can write it on any piece of paper.  please call us sooner if your blood sugar goes below 70, or if you have a lot of readings over 200.   Please continue the same 70/30 insulin, 170 units with breakfast.  However, take just 100 units on Sunday morning, and:  Please increase the evening regular insulin to 75 units with supper.   On this type of insulin schedule, you should eat meals on a regular schedule.  If a meal is missed or significantly delayed, your blood sugar could go low.   Please come back for a follow-up appointment in 3 months.

## 2017-11-18 NOTE — Progress Notes (Signed)
Subjective:    Patient ID: Krista Strickland, female    DOB: 1950-06-26, 67 y.o.   MRN: [id number]  HPI Pt returns for f/u of diabetes.   DM type: insulin-requiring type 2 Dx'ed: 0347 Complications: polyneuropathy, renal insufficiency and CVA.  Therapy: insulin since 2010 GDM: never DKA: never Severe hypoglycemia: never.   Pancreatitis: never.   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 she cannot afford analogs, so we had to try 70/30 (the NPH caused no reaction this time).  She takes 70/30 QAM and reg QPM, due to the pattern of cbg's.   Interval history:  no cbg record, but states cbg's vary from 170-400's.  It is in general lowest in the afternoon. pt states she feels well in general.  She says she never misses the insulin. Pt also has small multinodular goiter (euthyroid; f/u US in 2017 showed no change, with no thyroid nodule meeting criteria for biopsy or surveillance; she is euthyroid off rx).  Past Medical History:  Diagnosis Date  . Arthritis   . Cancer ([place])    uterine  . Chronic kidney disease   . Coronary artery disease   . Diabetes mellitus, type 2 ([place])   . GERD (gastroesophageal reflux disease)    no medication in 2017  . Gout   . History of MRSA infection 03/2009   pt denies this  . Hypercalcemia 2017   managed by nephrology  . Hyperlipidemia   . Hypertension   . Obesity   . Psoriasis   . Stroke [facility]) 2013   "light"    Past Surgical History:  Procedure Laterality Date  . ABDOMINAL HYSTERECTOMY    . APPENDECTOMY  2012  . COLONOSCOPY WITH PROPOFOL N/A 08/24/2016   Procedure: COLONOSCOPY WITH PROPOFOL;  Surgeon: Rogene Houston, MD;  [facility];  Service: Endoscopy;  Laterality: N/A;  10:30  . excison of rt breast cyst    . LAPAROSCOPIC SALPINGO OOPHERECTOMY Right 04/22/2012   Procedure: LAPAROSCOPIC SALPINGO OOPHORECTOMY;  Surgeon: Jonnie Kind, MD;   Location: [facility];  Service: Gynecology;  Laterality: Right;  end 11:17  . MASS EXCISION N/A 04/22/2012   Procedure: EXCISION SKIN TAGS NECK AND HEAD;  Surgeon: Jonnie Kind, MD;  Location: [facility];  Service: Gynecology;  Laterality: N/A;  start 11:19  . PARTIAL HYSTERECTOMY    . POLYPECTOMY  08/24/2016   Procedure: POLYPECTOMY;  Surgeon: Rogene Houston, MD;  [facility];  Service: Endoscopy;;  colon  . rt,. neck biopsy      Social History   Socioeconomic History  . Marital status: Married    Spouse name: Not on file  . Number of children: 1  . Years of education: Not on file  . Highest education level: 11th grade  Occupational History  . Occupation: disabled     Fish farm manager: UNEMPLOYED  Social Needs  . Financial resource strain: Not very hard  . Food insecurity:    Worry: Never true    Inability: Never true  . Transportation needs:    Medical: No    Non-medical: No  Tobacco Use  . Smoking status: Former Smoker    Packs/day: 0.25    Years: 12.00    Pack years: 3.00    Last attempt to quit: 07/16/1978    Years since quitting: 39.3  . Smokeless tobacco: Never Used  Substance and Sexual Activity  . Alcohol  use: No  . Drug use: No  . Sexual activity: Yes    Birth control/protection: Surgical  Lifestyle  . Physical activity:    Days per week: 0 days    Minutes per session: 0 min  . Stress: Not at all  Relationships  . Social connections:    Talks on phone: Twice a week    Gets together: Once a week    Attends religious service: More than 4 times per year    Active member of club or organization: No    Attends meetings of clubs or organizations: Never    Relationship status: Married  . Intimate partner violence:    Fear of current or ex partner: No    Emotionally abused: No    Physically abused: No    Forced sexual activity: No  Other Topics Concern  . Not on file  Social History Narrative  . Not on file    Current Outpatient Medications on File  Prior to Visit  Medication Sig Dispense Refill  . allopurinol (ZYLOPRIM) 300 MG tablet TAKE 1 TABLET BY MOUTH ONCE DAILY 90 tablet 1  . aspirin 325 MG tablet Take 1 tablet (325 mg total) by mouth daily.    . calcitRIOL (ROCALTROL) 0.25 MCG capsule Take 0.25 mcg by mouth 3 (three) times a week. Mondays, Wednesdays,Fridays    . cyclobenzaprine (FLEXERIL) 10 MG tablet Take 10 mg by mouth 3 (three) times daily as needed for muscle spasms.     Marland Kitchen diltiazem (DILT-XR) 180 MG 24 hr capsule Take 1 capsule (180 mg total) by mouth daily. 90 capsule 1  . docusate sodium (COLACE) 100 MG capsule Take 100 mg by mouth 2 (two) times daily. Constipation    . ezetimibe (ZETIA) 10 MG tablet Take 1 tablet (10 mg total) by mouth daily. 90 tablet 3  . gabapentin (NEURONTIN) 300 MG capsule Take 300 mg by mouth 3 (three) times daily.     . metolazone (ZAROXOLYN) 5 MG tablet TAKE 1 TABLET BY MOUTH ONCE DAILY 90 tablet 1  . metoprolol tartrate (LOPRESSOR) 100 MG tablet TAKE 1 TABLET BY MOUTH TWICE DAILY 180 tablet 2  . midodrine (PROAMATINE) 5 MG tablet Take 5 mg by mouth 2 (two) times daily.     . ONE TOUCH ULTRA TEST test strip USE 1 STRIP TO CHECK GLUCOSE TWICE DAILY 100 each 4  . ONETOUCH DELICA LANCETS 62I MISC 1 Device by Does not apply route 2 (two) times daily. 60 each 11  . potassium chloride (K-DUR) 10 MEQ tablet Take 3 tablets (30 mEq total) by mouth 3 (three) times daily. 270 tablet 1  . pravastatin (PRAVACHOL) 20 MG tablet TAKE 1 TABLET BY MOUTH ONCE DAILY BEFORE BREAKFAST 90 tablet 1  . RELION INSULIN SYR 1CC/30G 30G X 5/16" 1 ML MISC USE ONE SYRINGE TO INJECT INSULIN SUBCUTANEOUSLY THREE TIMES DAILY AS DIRECTED 150 each 0  . RELION INSULIN SYRINGE 31G X 15/64" 1 ML MISC USE 1 SYRINGE THREE TIMES DAILY AS DIRECTED 150 each 0  . spironolactone (ALDACTONE) 25 MG tablet TAKE 1 TABLET BY MOUTH ONCE DAILY 90 tablet 1  . torsemide (DEMADEX) 20 MG tablet Take 80 mg by mouth daily.      No current  facility-administered medications on file prior to visit.     Allergies  Allergen Reactions  . Benazepril Swelling  . Metronidazole Hives  . Mobic [Meloxicam] Hives  . Penicillins Hives and Swelling    Has patient had a PCN reaction  causing immediate rash, facial/tongue/throat swelling, SOB or lightheadedness with hypotension: No Has patient had a PCN reaction causing severe rash involving mucus membranes or skin necrosis: Yes Has patient had a PCN reaction that required hospitalization: No Has patient had a PCN reaction occurring within the last 10 years: No If all of the above answers are "NO", then may proceed with Cephalosporin use.   . Sulfonamide Derivatives Hives    Family History  Problem Relation Age of Onset  . Hypertension Brother   . Gout Brother   . Prostate cancer Brother   . Hypertension Brother   . Prostate cancer Brother   . Gout Brother   . Hypertension Sister   . Gout Sister   . Cancer Sister 80       pancreatic   . Leukemia Sister 38  . Gout Sister   . Prostate cancer Brother   . Diabetes Neg Hx     BP 140/60 (BP Location: Left Arm, Patient Position: Sitting, Cuff Size: Large)   Pulse 72   Ht 5\' 3"  (1.6 m)   Wt 202 lb 9.6 oz (91.9 kg)   SpO2 93%   BMI 35.89 kg/m    Review of Systems She denies hypoglycemia.      Objective:   Physical Exam VITAL SIGNS:  See vs page GENERAL: no distress.  Pulses: foot pulses are intact bilaterally.   MSK: no deformity of the feet or ankles.  CV: 1+ bilat edema of the legs is again noted Skin:  no ulcer on the feet or ankles.  normal color and temp on the feet and ankles.  Neuro: sensation is intact to touch on the feet and ankles, but decreased from normal Ext: There is bilateral onychomycosis of the toenails.    Lab Results  Component Value Date   HGBA1C 9.2 (A) 11/18/2017       Assessment & Plan:  Insulin-requiring type 2 DM, with renal insuff: she needs increased rx   Patient Instructions    check your blood sugar twice a day.  vary the time of day when you check, between before the 3 meals, and at bedtime.  also check if you have symptoms of your blood sugar being too high or too low.  please keep a record of the readings and bring it to your next appointment here.  You can write it on any piece of paper.  please call us sooner if your blood sugar goes below 70, or if you have a lot of readings over 200.   Please continue the same 70/30 insulin, 170 units with breakfast.  However, take just 100 units on Sunday morning, and:  Please increase the evening regular insulin to 75 units with supper.   On this type of insulin schedule, you should eat meals on a regular schedule.  If a meal is missed or significantly delayed, your blood sugar could go low.   Please come back for a follow-up appointment in 3 months.

## 2017-11-19 DIAGNOSIS — I10 Essential (primary) hypertension: Secondary | ICD-10-CM | POA: Diagnosis not present

## 2017-11-19 DIAGNOSIS — R809 Proteinuria, unspecified: Secondary | ICD-10-CM | POA: Diagnosis not present

## 2017-11-19 DIAGNOSIS — I509 Heart failure, unspecified: Secondary | ICD-10-CM | POA: Diagnosis not present

## 2017-11-19 DIAGNOSIS — N184 Chronic kidney disease, stage 4 (severe): Secondary | ICD-10-CM | POA: Diagnosis not present

## 2017-11-25 DIAGNOSIS — E1140 Type 2 diabetes mellitus with diabetic neuropathy, unspecified: Secondary | ICD-10-CM | POA: Diagnosis not present

## 2017-11-25 DIAGNOSIS — M79675 Pain in left toe(s): Secondary | ICD-10-CM | POA: Diagnosis not present

## 2017-11-25 DIAGNOSIS — M79674 Pain in right toe(s): Secondary | ICD-10-CM | POA: Diagnosis not present

## 2017-11-25 DIAGNOSIS — B351 Tinea unguium: Secondary | ICD-10-CM | POA: Diagnosis not present

## 2017-11-28 ENCOUNTER — Other Ambulatory Visit (HOSPITAL_COMMUNITY): Payer: Self-pay | Admitting: Nephrology

## 2017-11-28 DIAGNOSIS — N184 Chronic kidney disease, stage 4 (severe): Secondary | ICD-10-CM

## 2017-12-05 ENCOUNTER — Other Ambulatory Visit: Payer: Self-pay | Admitting: Family Medicine

## 2017-12-05 ENCOUNTER — Encounter (HOSPITAL_COMMUNITY): Payer: Self-pay

## 2017-12-05 ENCOUNTER — Encounter (HOSPITAL_COMMUNITY)
Admission: RE | Admit: 2017-12-05 | Discharge: 2017-12-05 | Disposition: A | Discharge status: Home / Self Care | Payer: PPO | Source: Ambulatory Visit | Attending: Nephrology | Admitting: Nephrology

## 2017-12-05 DIAGNOSIS — N184 Chronic kidney disease, stage 4 (severe): Secondary | ICD-10-CM | POA: Diagnosis not present

## 2017-12-05 MED ORDER — TECHNETIUM TC 99M SESTAMIBI - CARDIOLITE
25.0000 | Freq: Once | INTRAVENOUS | Status: AC | PRN
  Administered 2017-12-05: 10:00:00 25.2 via INTRAVENOUS

## 2018-01-08 DIAGNOSIS — E1140 Type 2 diabetes mellitus with diabetic neuropathy, unspecified: Secondary | ICD-10-CM | POA: Diagnosis not present

## 2018-01-08 DIAGNOSIS — I69898 Other sequelae of other cerebrovascular disease: Secondary | ICD-10-CM | POA: Diagnosis not present

## 2018-01-08 DIAGNOSIS — I951 Orthostatic hypotension: Secondary | ICD-10-CM | POA: Diagnosis not present

## 2018-01-08 DIAGNOSIS — R2689 Other abnormalities of gait and mobility: Secondary | ICD-10-CM | POA: Diagnosis not present

## 2018-01-13 ENCOUNTER — Other Ambulatory Visit: Payer: Self-pay | Admitting: Family Medicine

## 2018-01-13 DIAGNOSIS — Z1231 Encounter for screening mammogram for malignant neoplasm of breast: Secondary | ICD-10-CM

## 2018-02-12 ENCOUNTER — Encounter: Payer: Self-pay | Admitting: Family Medicine

## 2018-02-12 ENCOUNTER — Ambulatory Visit (INDEPENDENT_AMBULATORY_CARE_PROVIDER_SITE_OTHER): Payer: PPO | Admitting: Family Medicine

## 2018-02-12 VITALS — BP 122/64 | HR 88 | Resp 15 | Ht 63.0 in | Wt 199.0 lb

## 2018-02-12 DIAGNOSIS — E785 Hyperlipidemia, unspecified: Secondary | ICD-10-CM | POA: Diagnosis not present

## 2018-02-12 DIAGNOSIS — E790 Hyperuricemia without signs of inflammatory arthritis and tophaceous disease: Secondary | ICD-10-CM | POA: Diagnosis not present

## 2018-02-12 DIAGNOSIS — M79674 Pain in right toe(s): Secondary | ICD-10-CM | POA: Diagnosis not present

## 2018-02-12 DIAGNOSIS — M5440 Lumbago with sciatica, unspecified side: Secondary | ICD-10-CM

## 2018-02-12 DIAGNOSIS — I10 Essential (primary) hypertension: Secondary | ICD-10-CM

## 2018-02-12 DIAGNOSIS — E1140 Type 2 diabetes mellitus with diabetic neuropathy, unspecified: Secondary | ICD-10-CM | POA: Diagnosis not present

## 2018-02-12 DIAGNOSIS — Z0000 Encounter for general adult medical examination without abnormal findings: Secondary | ICD-10-CM | POA: Diagnosis not present

## 2018-02-12 DIAGNOSIS — E1165 Type 2 diabetes mellitus with hyperglycemia: Secondary | ICD-10-CM | POA: Diagnosis not present

## 2018-02-12 DIAGNOSIS — B351 Tinea unguium: Secondary | ICD-10-CM | POA: Diagnosis not present

## 2018-02-12 DIAGNOSIS — G8929 Other chronic pain: Secondary | ICD-10-CM | POA: Diagnosis not present

## 2018-02-12 DIAGNOSIS — M79675 Pain in left toe(s): Secondary | ICD-10-CM | POA: Diagnosis not present

## 2018-02-12 DIAGNOSIS — Z794 Long term (current) use of insulin: Secondary | ICD-10-CM

## 2018-02-12 DIAGNOSIS — R945 Abnormal results of liver function studies: Secondary | ICD-10-CM | POA: Diagnosis not present

## 2018-02-12 DIAGNOSIS — N183 Chronic kidney disease, stage 3 unspecified: Secondary | ICD-10-CM

## 2018-02-12 NOTE — Patient Instructions (Addendum)
F/u in 3.5 months, call if you need me before  Microalb from office today  You will be referred to dr Luna Glasgow re back pain  PLEASE speak with dr Jonell Cluck your kidney function and the plan  Fasting lipid and hepatic panel, order will be given today, please get this the same day you get labs for kidney doctor  Foot exam shows nerve damage please examine feet every day

## 2018-02-12 NOTE — Assessment & Plan Note (Signed)
Increased and uncontrolled back pain, refer to Ortho

## 2018-02-13 ENCOUNTER — Other Ambulatory Visit (HOSPITAL_COMMUNITY)
Admission: RE | Admit: 2018-02-13 | Discharge: 2018-02-13 | Disposition: A | Discharge status: Home / Self Care | Payer: PPO | Source: Ambulatory Visit | Attending: Family Medicine | Admitting: Family Medicine

## 2018-02-13 DIAGNOSIS — Z794 Long term (current) use of insulin: Secondary | ICD-10-CM | POA: Insufficient documentation

## 2018-02-13 DIAGNOSIS — E119 Type 2 diabetes mellitus without complications: Secondary | ICD-10-CM | POA: Insufficient documentation

## 2018-02-14 ENCOUNTER — Other Ambulatory Visit: Payer: Self-pay | Admitting: Family Medicine

## 2018-02-14 DIAGNOSIS — R7401 Elevation of levels of liver transaminase levels: Secondary | ICD-10-CM

## 2018-02-14 DIAGNOSIS — R740 Nonspecific elevation of levels of transaminase and lactic acid dehydrogenase [LDH]: Principal | ICD-10-CM

## 2018-02-14 LAB — HEPATIC FUNCTION PANEL
AG Ratio: 1.6 (calc) (ref 1.0–2.5)
ALKALINE PHOSPHATASE (APISO): 81 U/L (ref 33–130)
ALT: 69 U/L — ABNORMAL HIGH (ref 6–29)
AST: 65 U/L — ABNORMAL HIGH (ref 10–35)
Albumin: 4.2 g/dL (ref 3.6–5.1)
BILIRUBIN DIRECT: 0.1 mg/dL (ref 0.0–0.2)
BILIRUBIN TOTAL: 0.3 mg/dL (ref 0.2–1.2)
Globulin: 2.7 g/dL (calc) (ref 1.9–3.7)
Indirect Bilirubin: 0.2 mg/dL (calc) (ref 0.2–1.2)
Total Protein: 6.9 g/dL (ref 6.1–8.1)

## 2018-02-14 LAB — MICROALBUMIN / CREATININE URINE RATIO
Creatinine, Urine: 42.5 mg/dL
MICROALB UR: 59 ug/mL — AB
MICROALB/CREAT RATIO: 138.8 mg/g{creat} — AB (ref 0.0–30.0)

## 2018-02-14 LAB — LIPID PANEL
CHOL/HDL RATIO: 4.5 (calc) (ref <5.0)
CHOLESTEROL: 134 mg/dL (ref <200)
HDL: 30 mg/dL — AB (ref >50)
LDL CHOLESTEROL (CALC): 70 mg/dL
NON-HDL CHOLESTEROL (CALC): 104 mg/dL (ref <130)
Triglycerides: 257 mg/dL — ABNORMAL HIGH (ref <150)

## 2018-02-14 LAB — URIC ACID: Uric Acid, Serum: 6.2 mg/dL (ref 2.5–7.0)

## 2018-02-14 NOTE — Progress Notes (Signed)
[place] ruq

## 2018-02-18 ENCOUNTER — Ambulatory Visit (INDEPENDENT_AMBULATORY_CARE_PROVIDER_SITE_OTHER): Payer: PPO | Admitting: Endocrinology

## 2018-02-18 ENCOUNTER — Encounter: Payer: Self-pay | Admitting: Endocrinology

## 2018-02-18 VITALS — BP 138/64 | HR 60 | Ht 63.0 in | Wt 199.4 lb

## 2018-02-18 DIAGNOSIS — Z794 Long term (current) use of insulin: Secondary | ICD-10-CM | POA: Diagnosis not present

## 2018-02-18 DIAGNOSIS — E119 Type 2 diabetes mellitus without complications: Secondary | ICD-10-CM | POA: Diagnosis not present

## 2018-02-18 DIAGNOSIS — IMO0001 Reserved for inherently not codable concepts without codable children: Secondary | ICD-10-CM

## 2018-02-18 LAB — POCT GLYCOSYLATED HEMOGLOBIN (HGB A1C): Hemoglobin A1C: 9 % — AB (ref 4.0–5.6)

## 2018-02-18 MED ORDER — INSULIN NPH ISOPHANE & REGULAR (70-30) 100 UNIT/ML ~~LOC~~ SUSP: 190.0000 [IU] | Freq: Every day | SUBCUTANEOUS | 11 refills | Status: DC

## 2018-02-18 MED ORDER — INSULIN REGULAR HUMAN 100 UNIT/ML IJ SOLN: 80.0000 [IU] | Freq: Every day | INTRAMUSCULAR | 11 refills | Status: DC

## 2018-02-18 NOTE — Patient Instructions (Addendum)
check your blood sugar twice a day.  vary the time of day when you check, between before the 3 meals, and at bedtime.  also check if you have symptoms of your blood sugar being too high or too low.  please keep a record of the readings and bring it to your next appointment here.  You can write it on any piece of paper.  please call us sooner if your blood sugar goes below 70, or if you have a lot of readings over 200.   Please increase the 70/30 insulin to 190 units with breakfast.  However, take just 90 units on Sunday morning, and:  Please increase the regular insulin to 80 units with supper.   On this type of insulin schedule, you should eat meals on a regular schedule.  If a meal is missed or significantly delayed, your blood sugar could go low.   Please come back for a follow-up appointment in 2 months.

## 2018-02-18 NOTE — Progress Notes (Signed)
Subjective:    Patient ID: Krista Strickland, female    DOB: Jun 16, 1950, 68 y.o.   MRN: [id number]  HPI Pt returns for f/u of diabetes.   DM type: insulin-requiring type 2 Dx'ed: 9390 Complications: polyneuropathy, renal insufficiency and CVA.  Therapy: insulin since 2010 GDM: never DKA: never Severe hypoglycemia: never.   Pancreatitis: never.   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 she cannot afford analogs, so we had to try 70/30 (the NPH component caused no reaction this time).  She takes 70/30 QAM and reg QPM, due to the pattern of cbg's.   Interval history:  no cbg record, but states cbg's vary from 100-200.  It is in general higher as the day goes on.  pt states she feels well in general.  She says she never misses the insulin.  She has mild hypoglycemia at lunch on Sunday, even though she takes just 100 units that AM.  Pt also has small multinodular goiter (euthyroid; f/u US in 2017 showed no change, with no thyroid nodule meeting criteria for biopsy or surveillance; she is euthyroid off rx).  Past Medical History:  Diagnosis Date  . Arthritis   . Cancer ([place])    uterine  . Chronic kidney disease   . Coronary artery disease   . Diabetes mellitus, type 2 ([place])   . GERD (gastroesophageal reflux disease)    no medication in 2017  . Gout   . History of MRSA infection 03/2009   pt denies this  . Hypercalcemia 2017   managed by nephrology  . Hyperlipidemia   . Hypertension   . Obesity   . Psoriasis   . Stroke [facility]) 2013   "light"    Past Surgical History:  Procedure Laterality Date  . ABDOMINAL HYSTERECTOMY    . APPENDECTOMY  2012  . COLONOSCOPY WITH PROPOFOL N/A 08/24/2016   Procedure: COLONOSCOPY WITH PROPOFOL;  Surgeon: Rogene Houston, MD;  [facility];  Service: Endoscopy;  Laterality: N/A;  10:30  . excison of rt breast cyst    . LAPAROSCOPIC SALPINGO  OOPHERECTOMY Right 04/22/2012   Procedure: LAPAROSCOPIC SALPINGO OOPHORECTOMY;  Surgeon: Jonnie Kind, MD;  Location: [facility] ORS;  Service: Gynecology;  Laterality: Right;  end 11:17  . MASS EXCISION N/A 04/22/2012   Procedure: EXCISION SKIN TAGS NECK AND HEAD;  Surgeon: Jonnie Kind, MD;  Location: [facility] ORS;  Service: Gynecology;  Laterality: N/A;  start 11:19  . PARTIAL HYSTERECTOMY    . POLYPECTOMY  08/24/2016   Procedure: POLYPECTOMY;  Surgeon: Rogene Houston, MD;  Location: [facility];  Service: Endoscopy;;  colon  . rt,. neck biopsy      Social History   Socioeconomic History  . Marital status: Married    Spouse name: Not on file  . Number of children: 1  . Years of education: Not on file  . Highest education level: 11th grade  Occupational History  . Occupation: disabled     Fish farm manager: UNEMPLOYED  Social Needs  . Financial resource strain: Not very hard  . Food insecurity:    Worry: Never true    Inability: Never true  . Transportation needs:    Medical: No    Non-medical: No  Tobacco Use  . Smoking status: Former Smoker    Packs/day: 0.25    Years: 12.00    Pack years: 3.00    Last attempt to  quit: 07/16/1978    Years since quitting: 39.6  . Smokeless tobacco: Never Used  Substance and Sexual Activity  . Alcohol use: No  . Drug use: No  . Sexual activity: Yes    Birth control/protection: Surgical  Lifestyle  . Physical activity:    Days per week: 0 days    Minutes per session: 0 min  . Stress: Not at all  Relationships  . Social connections:    Talks on phone: Twice a week    Gets together: Once a week    Attends religious service: More than 4 times per year    Active member of club or organization: No    Attends meetings of clubs or organizations: Never    Relationship status: Married  . Intimate partner violence:    Fear of current or ex partner: No    Emotionally abused: No    Physically abused: No    Forced sexual activity: No  Other Topics  Concern  . Not on file  Social History Narrative  . Not on file    Current Outpatient Medications on File Prior to Visit  Medication Sig Dispense Refill  . allopurinol (ZYLOPRIM) 300 MG tablet TAKE 1 TABLET BY MOUTH ONCE DAILY 90 tablet 1  . aspirin 325 MG tablet Take 1 tablet (325 mg total) by mouth daily.    . calcitRIOL (ROCALTROL) 0.25 MCG capsule Take 0.25 mcg by mouth 3 (three) times a week. Mondays, Wednesdays,Fridays    . cyclobenzaprine (FLEXERIL) 10 MG tablet Take 10 mg by mouth 3 (three) times daily as needed for muscle spasms.     Marland Kitchen DILT-XR 180 MG 24 hr capsule TAKE 1 CAPSULE BY MOUTH ONCE DAILY 90 capsule 1  . docusate sodium (COLACE) 100 MG capsule Take 100 mg by mouth 2 (two) times daily. Constipation    . DULoxetine (CYMBALTA) 30 MG capsule Take 30 mg by mouth daily.    Marland Kitchen ezetimibe (ZETIA) 10 MG tablet Take 1 tablet (10 mg total) by mouth daily. 90 tablet 3  . gabapentin (NEURONTIN) 300 MG capsule Take 300 mg by mouth 3 (three) times daily.     . metolazone (ZAROXOLYN) 5 MG tablet TAKE 1 TABLET BY MOUTH ONCE DAILY 90 tablet 1  . metoprolol tartrate (LOPRESSOR) 100 MG tablet TAKE 1 TABLET BY MOUTH TWICE DAILY 180 tablet 2  . midodrine (PROAMATINE) 5 MG tablet Take 5 mg by mouth 2 (two) times daily.     . ONE TOUCH ULTRA TEST test strip USE 1 STRIP TO CHECK GLUCOSE TWICE DAILY 100 each 4  . ONETOUCH DELICA LANCETS 40X MISC 1 Device by Does not apply route 2 (two) times daily. 60 each 11  . potassium chloride (K-DUR) 10 MEQ tablet Take 3 tablets (30 mEq total) by mouth 3 (three) times daily. 270 tablet 1  . pravastatin (PRAVACHOL) 20 MG tablet TAKE 1 TABLET BY MOUTH ONCE DAILY BEFORE BREAKFAST 90 tablet 1  . RELION INSULIN SYR 1CC/30G 30G X 5/16" 1 ML MISC USE ONE SYRINGE TO INJECT INSULIN SUBCUTANEOUSLY THREE TIMES DAILY AS DIRECTED 150 each 0  . RELION INSULIN SYRINGE 31G X 15/64" 1 ML MISC USE 1 SYRINGE THREE TIMES DAILY AS DIRECTED 150 each 0  . spironolactone (ALDACTONE)  25 MG tablet TAKE 1 TABLET BY MOUTH ONCE DAILY 90 tablet 1  . torsemide (DEMADEX) 20 MG tablet Take 80 mg by mouth daily.      No current facility-administered medications on file prior to visit.  Allergies  Allergen Reactions  . Benazepril Swelling  . Metronidazole Hives  . Mobic [Meloxicam] Hives  . Penicillins Hives and Swelling    Has patient had a PCN reaction causing immediate rash, facial/tongue/throat swelling, SOB or lightheadedness with hypotension: No Has patient had a PCN reaction causing severe rash involving mucus membranes or skin necrosis: Yes Has patient had a PCN reaction that required hospitalization: No Has patient had a PCN reaction occurring within the last 10 years: No If all of the above answers are "NO", then may proceed with Cephalosporin use.   . Sulfonamide Derivatives Hives    Family History  Problem Relation Age of Onset  . Hypertension Brother   . Gout Brother   . Prostate cancer Brother   . Hypertension Brother   . Prostate cancer Brother   . Gout Brother   . Hypertension Sister   . Gout Sister   . Cancer Sister 28       pancreatic   . Leukemia Sister 34  . Gout Sister   . Prostate cancer Brother   . Diabetes Neg Hx     BP 138/64 (BP Location: Left Arm, Patient Position: Sitting, Cuff Size: Large)   Pulse 60   Ht 5\' 3"  (1.6 m)   Wt 199 lb 6.4 oz (90.4 kg)   SpO2 95%   BMI 35.32 kg/m    Review of Systems She denies LOC    Objective:   Physical Exam VITAL SIGNS:  See vs page GENERAL: no distress Pulses: dorsalis pedis intact bilat.   MSK: no deformity of the feet CV: 1+ bilat leg edema Skin:  no ulcer on the feet.  normal color and temp on the feet. Neuro: sensation is intact to touch on the feet.     Lab Results  Component Value Date   HGBA1C 9.0 (A) 02/18/2018   Lab Results  Component Value Date   CREATININE 2.39 (H) 09/07/2017   BUN 86 (HH) 09/07/2017   NA 139 09/07/2017   K 4.1 09/07/2017   CL 96 09/07/2017     CO2 22 09/07/2017       Assessment & Plan:  Insulin-requiring type 2 DM, with PN: she needs increased rx.  Renal failure: she needs only reg insulin in the evening--not 70/30 then.   Edema: this limits rx options.   Patient Instructions  check your blood sugar twice a day.  vary the time of day when you check, between before the 3 meals, and at bedtime.  also check if you have symptoms of your blood sugar being too high or too low.  please keep a record of the readings and bring it to your next appointment here.  You can write it on any piece of paper.  please call us sooner if your blood sugar goes below 70, or if you have a lot of readings over 200.   Please increase the 70/30 insulin to 190 units with breakfast.  However, take just 90 units on Sunday morning, and:  Please increase the regular insulin to 80 units with supper.   On this type of insulin schedule, you should eat meals on a regular schedule.  If a meal is missed or significantly delayed, your blood sugar could go low.   Please come back for a follow-up appointment in 2 months.

## 2018-02-20 ENCOUNTER — Ambulatory Visit (HOSPITAL_COMMUNITY)
Admission: RE | Admit: 2018-02-20 | Discharge: 2018-02-20 | Disposition: A | Discharge status: Home / Self Care | Payer: PPO | Source: Ambulatory Visit | Attending: Family Medicine | Admitting: Family Medicine

## 2018-02-20 ENCOUNTER — Encounter (HOSPITAL_COMMUNITY): Payer: Self-pay

## 2018-02-20 DIAGNOSIS — D509 Iron deficiency anemia, unspecified: Secondary | ICD-10-CM | POA: Diagnosis not present

## 2018-02-20 DIAGNOSIS — R740 Nonspecific elevation of levels of transaminase and lactic acid dehydrogenase [LDH]: Secondary | ICD-10-CM | POA: Diagnosis not present

## 2018-02-20 DIAGNOSIS — N183 Chronic kidney disease, stage 3 (moderate): Secondary | ICD-10-CM | POA: Diagnosis not present

## 2018-02-20 DIAGNOSIS — I10 Essential (primary) hypertension: Secondary | ICD-10-CM | POA: Diagnosis not present

## 2018-02-20 DIAGNOSIS — Z1231 Encounter for screening mammogram for malignant neoplasm of breast: Secondary | ICD-10-CM

## 2018-02-20 DIAGNOSIS — R809 Proteinuria, unspecified: Secondary | ICD-10-CM | POA: Diagnosis not present

## 2018-02-20 DIAGNOSIS — Z79899 Other long term (current) drug therapy: Secondary | ICD-10-CM | POA: Diagnosis not present

## 2018-02-20 DIAGNOSIS — E559 Vitamin D deficiency, unspecified: Secondary | ICD-10-CM | POA: Diagnosis not present

## 2018-02-21 ENCOUNTER — Ambulatory Visit (HOSPITAL_COMMUNITY)
Admission: RE | Admit: 2018-02-21 | Discharge: 2018-02-21 | Disposition: A | Discharge status: Home / Self Care | Payer: PPO | Source: Ambulatory Visit | Attending: Family Medicine | Admitting: Family Medicine

## 2018-02-21 DIAGNOSIS — R740 Nonspecific elevation of levels of transaminase and lactic acid dehydrogenase [LDH]: Principal | ICD-10-CM

## 2018-02-21 DIAGNOSIS — K8020 Calculus of gallbladder without cholecystitis without obstruction: Secondary | ICD-10-CM | POA: Diagnosis not present

## 2018-02-21 DIAGNOSIS — R7401 Elevation of levels of liver transaminase levels: Secondary | ICD-10-CM

## 2018-02-25 DIAGNOSIS — E1129 Type 2 diabetes mellitus with other diabetic kidney complication: Secondary | ICD-10-CM | POA: Diagnosis not present

## 2018-02-25 DIAGNOSIS — N184 Chronic kidney disease, stage 4 (severe): Secondary | ICD-10-CM | POA: Diagnosis not present

## 2018-02-25 DIAGNOSIS — I509 Heart failure, unspecified: Secondary | ICD-10-CM | POA: Diagnosis not present

## 2018-02-26 ENCOUNTER — Ambulatory Visit: Payer: PPO | Admitting: Orthopaedic Surgery

## 2018-02-26 ENCOUNTER — Encounter: Payer: Self-pay | Admitting: Orthopaedic Surgery

## 2018-02-26 VITALS — BP 149/64 | HR 66 | Ht 63.0 in | Wt 201.0 lb

## 2018-02-26 DIAGNOSIS — E0849 Diabetes mellitus due to underlying condition with other diabetic neurological complication: Secondary | ICD-10-CM

## 2018-02-26 DIAGNOSIS — G8929 Other chronic pain: Secondary | ICD-10-CM

## 2018-02-26 DIAGNOSIS — M1A0790 Idiopathic chronic gout, unspecified ankle and foot, without tophus (tophi): Secondary | ICD-10-CM | POA: Diagnosis not present

## 2018-02-26 DIAGNOSIS — G609 Hereditary and idiopathic neuropathy, unspecified: Secondary | ICD-10-CM

## 2018-02-26 DIAGNOSIS — M5442 Lumbago with sciatica, left side: Secondary | ICD-10-CM | POA: Diagnosis not present

## 2018-02-26 MED ORDER — TIZANIDINE HCL 4 MG PO TABS: ORAL_TABLET | ORAL | 3 refills | Status: DC

## 2018-02-26 MED ORDER — ALLOPURINOL 300 MG PO TABS: 300.0000 mg | ORAL_TABLET | Freq: Every day | ORAL | 5 refills | Status: DC

## 2018-02-26 MED ORDER — HYDROCODONE-ACETAMINOPHEN 5-325 MG PO TABS: ORAL_TABLET | ORAL | 0 refills | Status: DC

## 2018-02-26 NOTE — Patient Instructions (Signed)
  Physical therapy has been ordered for you at [facility] [phone] is the phone number to call if you want to call to schedule. Please let us know if you do not hear anything within one week.  

## 2018-02-26 NOTE — Progress Notes (Signed)
Subjective:    Patient ID: Krista Strickland, female    DOB: 1950-04-22, 68 y.o.   MRN: [id number]  HPI She has chronic lower back pain and peripheral neuropathy secondary to diabetes mellitus.  Her last A1C is 9 she states.  She is followed by Dr. Moshe Cipro.  She had been seen at the neurosurgeons in [place] but she would prefer to be seen here.  I saw her here years ago.  She has had x-rays, myelogram of the lumbar spine by the neurosurgeons.  She has been on Gabapentin for the neuropathy.  She had been on pain medicine but none recently.  Her pain is made worse with cold weather and excess walking.  Her legs hurt some days so much she does not go out of the house.  She has no ulcers, no wounds.  I have reviewed her notes from the neurosurgeon for the last three years and the x-rays and reports.  I have reviewed Dr. Griffin Dakin noted.   Review of Systems  Constitutional: Positive for activity change.  Musculoskeletal: Positive for arthralgias, back pain, gait problem and joint swelling.  All other systems reviewed and are negative.  For Review of Systems, all other systems reviewed and are negative.  The following is a summary of the past history medically, past history surgically, known current medicines, social history and family history.  This information is gathered electronically by the computer from prior information and documentation.  I review this each visit and have found including this information at this point in the chart is beneficial and informative.   Past Medical History:  Diagnosis Date  . Arthritis   . Cancer ([place])    uterine  . Chronic kidney disease   . Coronary artery disease   . Diabetes mellitus, type 2 ([place])   . GERD (gastroesophageal reflux disease)    no medication in 2017  . Gout   . History of MRSA infection 03/2009   pt denies this  . Hypercalcemia 2017   managed by nephrology  . Hyperlipidemia   . Hypertension   . Obesity   . Psoriasis   .  Stroke [facility]) 2013   "light"    Past Surgical History:  Procedure Laterality Date  . ABDOMINAL HYSTERECTOMY    . APPENDECTOMY  2012  . COLONOSCOPY WITH PROPOFOL N/A 08/24/2016   Procedure: COLONOSCOPY WITH PROPOFOL;  Surgeon: Rogene Houston, MD;  [facility];  Service: Endoscopy;  Laterality: N/A;  10:30  . excison of rt breast cyst    . LAPAROSCOPIC SALPINGO OOPHERECTOMY Right 04/22/2012   Procedure: LAPAROSCOPIC SALPINGO OOPHORECTOMY;  Surgeon: Jonnie Kind, MD;  Location: [facility];  Service: Gynecology;  Laterality: Right;  end 11:17  . MASS EXCISION N/A 04/22/2012   Procedure: EXCISION SKIN TAGS NECK AND HEAD;  Surgeon: Jonnie Kind, MD;  Location: [facility];  Service: Gynecology;  Laterality: N/A;  start 11:19  . PARTIAL HYSTERECTOMY    . POLYPECTOMY  08/24/2016   Procedure: POLYPECTOMY;  Surgeon: Rogene Houston, MD;  Location[facility];  Service: Endoscopy;;  colon  . rt,. neck biopsy      Current Outpatient Medications on File Prior to Visit  Medication Sig Dispense Refill  . allopurinol (ZYLOPRIM) 300 MG tablet TAKE 1 TABLET BY MOUTH ONCE DAILY 90 tablet 1  . aspirin 325 MG tablet Take 1 tablet (325 mg total) by mouth daily.    . calcitRIOL (ROCALTROL) 0.25 MCG capsule Take 0.25 mcg  by mouth 3 (three) times a week. Mondays, Wednesdays,Fridays    . cyclobenzaprine (FLEXERIL) 10 MG tablet Take 10 mg by mouth 3 (three) times daily as needed for muscle spasms.     Marland Kitchen DILT-XR 180 MG 24 hr capsule TAKE 1 CAPSULE BY MOUTH ONCE DAILY 90 capsule 1  . docusate sodium (COLACE) 100 MG capsule Take 100 mg by mouth 2 (two) times daily. Constipation    . DULoxetine (CYMBALTA) 30 MG capsule Take 30 mg by mouth daily.    Marland Kitchen ezetimibe (ZETIA) 10 MG tablet Take 1 tablet (10 mg total) by mouth daily. 90 tablet 3  . gabapentin (NEURONTIN) 300 MG capsule Take 300 mg by mouth 3 (three) times daily.     . insulin NPH-regular Human (NOVOLIN 70/30 RELION) (70-30) 100 UNIT/ML injection  Inject 190 Units into the skin daily with breakfast. 70 mL 11  . insulin regular (NOVOLIN R RELION) 100 units/mL injection Inject 0.8 mLs (80 Units total) into the skin daily with supper. 30 mL 11  . metolazone (ZAROXOLYN) 5 MG tablet TAKE 1 TABLET BY MOUTH ONCE DAILY 90 tablet 1  . metoprolol tartrate (LOPRESSOR) 100 MG tablet TAKE 1 TABLET BY MOUTH TWICE DAILY 180 tablet 2  . midodrine (PROAMATINE) 5 MG tablet Take 5 mg by mouth 2 (two) times daily.     . ONE TOUCH ULTRA TEST test strip USE 1 STRIP TO CHECK GLUCOSE TWICE DAILY 100 each 4  . ONETOUCH DELICA LANCETS 74J MISC 1 Device by Does not apply route 2 (two) times daily. 60 each 11  . potassium chloride (K-DUR) 10 MEQ tablet Take 3 tablets (30 mEq total) by mouth 3 (three) times daily. 270 tablet 1  . pravastatin (PRAVACHOL) 20 MG tablet TAKE 1 TABLET BY MOUTH ONCE DAILY BEFORE BREAKFAST 90 tablet 1  . RELION INSULIN SYR 1CC/30G 30G X 5/16" 1 ML MISC USE ONE SYRINGE TO INJECT INSULIN SUBCUTANEOUSLY THREE TIMES DAILY AS DIRECTED 150 each 0  . RELION INSULIN SYRINGE 31G X 15/64" 1 ML MISC USE 1 SYRINGE THREE TIMES DAILY AS DIRECTED 150 each 0  . spironolactone (ALDACTONE) 25 MG tablet TAKE 1 TABLET BY MOUTH ONCE DAILY 90 tablet 1  . torsemide (DEMADEX) 20 MG tablet Take 80 mg by mouth daily.      No current facility-administered medications on file prior to visit.     Social History   Socioeconomic History  . Marital status: Married    Spouse name: Not on file  . Number of children: 1  . Years of education: Not on file  . Highest education level: 11th grade  Occupational History  . Occupation: disabled     Fish farm manager: UNEMPLOYED  Social Needs  . Financial resource strain: Not very hard  . Food insecurity:    Worry: Never true    Inability: Never true  . Transportation needs:    Medical: No    Non-medical: No  Tobacco Use  . Smoking status: Former Smoker    Packs/day: 0.25    Years: 12.00    Pack years: 3.00    Last attempt  to quit: 07/16/1978    Years since quitting: 39.6  . Smokeless tobacco: Never Used  Substance and Sexual Activity  . Alcohol use: No  . Drug use: No  . Sexual activity: Yes    Birth control/protection: Surgical  Lifestyle  . Physical activity:    Days per week: 0 days    Minutes per session: 0 min  .  Stress: Not at all  Relationships  . Social connections:    Talks on phone: Twice a week    Gets together: Once a week    Attends religious service: More than 4 times per year    Active member of club or organization: No    Attends meetings of clubs or organizations: Never    Relationship status: Married  . Intimate partner violence:    Fear of current or ex partner: No    Emotionally abused: No    Physically abused: No    Forced sexual activity: No  Other Topics Concern  . Not on file  Social History Narrative  . Not on file    Family History  Problem Relation Age of Onset  . Hypertension Brother   . Gout Brother   . Prostate cancer Brother   . Hypertension Brother   . Prostate cancer Brother   . Gout Brother   . Hypertension Sister   . Gout Sister   . Cancer Sister 47       pancreatic   . Leukemia Sister 78  . Gout Sister   . Prostate cancer Brother   . Diabetes Neg Hx     BP (!) 149/64   Pulse 66   Ht 5\' 3"  (1.6 m)   Wt 201 lb (91.2 kg)   BMI 35.61 kg/m   Body mass index is 35.61 kg/m.      Objective:   Physical Exam Constitutional:      Appearance: She is well-developed.  HENT:     Head: Normocephalic and atraumatic.  Eyes:     Conjunctiva/sclera: Conjunctivae normal.     Pupils: Pupils are equal, round, and reactive to light.  Neck:     Musculoskeletal: Normal range of motion and neck supple.  Cardiovascular:     Rate and Rhythm: Normal rate and regular rhythm.  Pulmonary:     Effort: Pulmonary effort is normal.  Abdominal:     Palpations: Abdomen is soft.  Musculoskeletal:       Back:  Skin:    General: Skin is warm and dry.    Neurological:     Mental Status: She is alert and oriented to person, place, and time.     Cranial Nerves: No cranial nerve deficit.     Motor: No abnormal muscle tone.     Coordination: Coordination normal.     Deep Tendon Reflexes: Reflexes are normal and symmetric. Reflexes normal.  Psychiatric:        Behavior: Behavior normal.        Thought Content: Thought content normal.        Judgment: Judgment normal.           Assessment & Plan:   Encounter Diagnoses  Name Primary?  . Chronic left-sided low back pain with left-sided sciatica Yes  . Hereditary and idiopathic peripheral neuropathy   . Other diabetic neurological complication associated with diabetes mellitus due to underlying condition ([place])   . Chronic idiopathic gout involving toe without tophus, unspecified laterality    I have called in allopurinol, Zanaflex and pain medicine.  I have reviewed the [place] web site prior to prescribing narcotic medicine for this patient.   Begin PT for the back.  Return in three weeks.  Call if any problem.  Precautions discussed.   Electronically Signed Sanjuana Kava, MD 1/29/20203:26 PM

## 2018-03-05 ENCOUNTER — Encounter (HOSPITAL_COMMUNITY): Payer: Self-pay | Admitting: Physical Therapy

## 2018-03-05 ENCOUNTER — Other Ambulatory Visit: Payer: Self-pay

## 2018-03-05 ENCOUNTER — Ambulatory Visit (HOSPITAL_COMMUNITY): Payer: PPO | Attending: Orthopaedic Surgery | Admitting: Physical Therapy

## 2018-03-05 DIAGNOSIS — M545 Low back pain, unspecified: Secondary | ICD-10-CM

## 2018-03-05 DIAGNOSIS — G8929 Other chronic pain: Secondary | ICD-10-CM | POA: Insufficient documentation

## 2018-03-05 DIAGNOSIS — R29898 Other symptoms and signs involving the musculoskeletal system: Secondary | ICD-10-CM | POA: Diagnosis not present

## 2018-03-05 NOTE — Therapy (Signed)
[place], [place], [address] Phone: [phone]   Fax:  [phone]  Physical Therapy Evaluation  Patient Details  Name: Krista Strickland MRN: [id number] Date of Birth: Oct 28, 1950 Referring Provider (PT): Sanjuana Kava, MD   Encounter Date: 03/05/2018  PT End of Session - 03/05/18 0811    Visit Number  1    Number of Visits  9    Date for PT Re-Evaluation  04/02/18    Authorization Type  [employer] (No visit limit)    Authorization Time Period  03/05/18 - 04/02/18    Authorization - Visit Number  1    Authorization - Number of Visits  10    PT Start Time  0815    PT Stop Time  0900    PT Time Calculation (min)  45 min    Activity Tolerance  Patient tolerated treatment well;No increased pain    Behavior During Therapy  WFL for tasks assessed/performed       Past Medical History:  Diagnosis Date  . Arthritis   . Cancer ([place])    uterine  . Chronic kidney disease   . Coronary artery disease   . Diabetes mellitus, type 2 ([place])   . GERD (gastroesophageal reflux disease)    no medication in 2017  . Gout   . History of MRSA infection 03/2009   pt denies this  . Hypercalcemia 2017   managed by nephrology  . Hyperlipidemia   . Hypertension   . Obesity   . Psoriasis   . Stroke [facility]) 2013   "light"    Past Surgical History:  Procedure Laterality Date  . ABDOMINAL HYSTERECTOMY    . APPENDECTOMY  2012  . COLONOSCOPY WITH PROPOFOL N/A 08/24/2016   Procedure: COLONOSCOPY WITH PROPOFOL;  Surgeon: Rogene Houston, MD;  Location: [facility] END[facility] SU[facility];  Service: Endoscopy;  Laterality: N/A;  10:30  . excison of rt breast cyst    . LAPAROSCOPIC SALPINGO OOPHERECTOMY Right 04/22/2012   Procedure: LAPAROSCOPIC SALPINGO OOPHORECTOMY;  Surgeon: Jonnie Kind, MD;  Location: [facility] ORS;  Service: Gynecology;  Laterality: Right;  end 11:17  . MASS EXCISION N/A 04/22/2012   Procedure: EXCISION SKIN TAGS NECK AND HEAD;   Surgeon: Jonnie Kind, MD;  Location: [facility] ORS;  Service: Gynecology;  Laterality: N/A;  start 11:19  . PARTIAL HYSTERECTOMY    . POLYPECTOMY  08/24/2016   Procedure: POLYPECTOMY;  Surgeon: Rogene Houston, MD;  Location: [facility] END[facility] SUITE;  Service: Endoscopy;;  colon  . rt,. neck biopsy      There were no vitals filed for this visit.   Subjective Assessment - 03/05/18 0824    Subjective  Patient reported that she gets muscle spasms when she is walking down her left and right side, but more often on the left. She said she feels like she is going to fall at times. Patient stated the back pain has been occurring off and on for the past 3 years. Patient reported that the pain came on slowly. Patient denied any changes in her bowel and bladder function. Patient denied any tingling or numbness. Patient denied saddle paresthesia. Patient stated that the pain is usually more on the left side. She stated that the pain goes down to her feet at times. Patient stated that leaning forward makes her back pain feel better.     Limitations  Standing;Walking;House hold activities;Lifting    How long can you sit comfortably?  Not limited    How long can you stand comfortably?  10 minutes    How long can you walk comfortably?  A couple minutes; doesn't do much walking    Diagnostic tests  X-ray of left hip 06/06/17: Negative    Patient Stated Goals  To have decreased pain    Currently in Pain?  Yes    Pain Score  5     Pain Location  Back    Pain Orientation  Right;Left;Lower    Pain Descriptors / Indicators  Aching    Pain Type  Chronic pain    Pain Radiating Towards  Radiating down the left side    Pain Onset  More than a month ago    Pain Frequency  Intermittent    Aggravating Factors   Standing up straight    Pain Relieving Factors  Sitting    Effect of Pain on Daily Activities  Moderately affects         [facility] PT Assessment - 03/05/18 0001      Assessment   Medical Diagnosis  Chronic Left-sided  low back pain with left-sided sciatica    Referring Provider (PT)  Sanjuana Kava, MD    Onset Date/Surgical Date  --   Several years ago   Next MD Visit  03/15/18    Prior Therapy  Yes, but unsure for what      Balance Screen   Has the patient fallen in the past 6 months  Yes    How many times?  2    Has the patient had a decrease in activity level because of a fear of falling?   No    Is the patient reluctant to leave their home because of a fear of falling?   No      Home Environment   Living Environment  Private residence    Living Arrangements  Spouse/significant other    Type of [place] Access  Level entry    [place]  None      Prior Function   Level of Independence  Independent with basic ADLs;Independent with household mobility without device    Vocation  Retired      Charity fundraiser Status  Within Functional Limits for tasks assessed      Observation/Other Assessments   Focus on Therapeutic Outcomes (FOTO)   66% limited      Sensation   Light Touch  Appears Intact      Posture/Postural Control   Posture/Postural Control  Postural limitations    Postural Limitations  Rounded Shoulders;Forward head;Decreased lumbar lordosis      ROM / Strength   AROM / PROM / Strength  AROM;Strength      AROM   AROM Assessment Site  Lumbar    Lumbar Flexion  50% limited with Gower's sign    Lumbar Extension  75% limited (Spasm down left side)    Lumbar - Right Side Bend  50% limited    Lumbar - Left Side Bend  50% limited    Lumbar - Right Rotation  75% limited    Lumbar - Left Rotation  75% limited      Strength   Overall Strength Comments  Ankle clonus absent in bilateral lower extremities    Strength Assessment Site  Hip;Knee;Ankle    Right/Left Hip  Right;Left    Right Hip Flexion  3+/5  Right Hip Extension  2+/5   sidelying   Right Hip ABduction  4-/5    Left Hip Flexion  3+/5    Left Hip Extension  2+/5     Left Hip ABduction  3+/5    Right/Left Knee  Right;Left    Right Knee Flexion  3+/5    Right Knee Extension  3-/5    Left Knee Flexion  3+/5    Left Knee Extension  3-/5    Right/Left Ankle  Right;Left    Right Ankle Dorsiflexion  4+/5    Left Ankle Dorsiflexion  4+/5      Palpation   Palpation comment  Patient reported tenderness to palpation of lumbar and thoracic paraspinals bilaterally      Ambulation/Gait   Gait Comments  Patient ambulated into and out of the clinic with forward flexed trunk and decreased step length bilaterally. With sit to stand transitions patient performed with use of upper extremities.       Balance   Balance Assessed  Yes      Static Standing Balance   Static Standing - Balance Support  No upper extremity supported    Static Standing Balance -  Activities   Single Leg Stance - Right Leg;Single Leg Stance - Left Leg    Static Standing - Comment/# of Minutes  Rt: 1 second; Lt: 3 seconds                Objective measurements completed on examination: See above findings.              PT Education - 03/05/18 0854    Education Details  Educated patient on examination findings and plan of care.     Person(s) Educated  Patient    Methods  Explanation    Comprehension  Verbalized understanding       PT Short Term Goals - 03/05/18 0906      PT SHORT TERM GOAL #1   Title  Patient will report understanding and regular compliance with HEP in order to improve, strength, balance, functional mobility, and decrease back pain.     Time  2    Period  Weeks    Status  New    Target Date  03/19/18      PT SHORT TERM GOAL #2   Title  Patient will demonstrate ability to maintain single limb stance on each lower extremity for at least 5 seconds on each lower extremity indicating improved stability and balance to negotiate stairs and curbs.     Time  2    Period  Weeks    Status  New    Target Date  03/19/18        PT Long Term Goals -  03/05/18 0909      PT LONG TERM GOAL #1   Title  Patient will demonstrate improvement of at least 1/2 MMT strength grade in all deficient muscle groups in order to improve mechanics with transfers and ambulation.     Time  4    Period  Weeks    Status  New    Target Date  04/02/18      PT LONG TERM GOAL #2   Title  Patient will report ability to stand for at least 20 minutes in order to prepare a meal at home without requiring to sit.     Time  4    Period  Weeks    Status  New    Target Date  04/02/18      PT LONG TERM GOAL #3   Title  Patient will report that her pain has not exceeded a 3/10 over the course of a 1 week period indicating improved tolerance to daily activities.     Time  4    Period  Weeks    Status  New    Target Date  04/02/18             Plan - 03/05/18 9702    Clinical Impression Statement  Patient is a 68 year old female who presented to outpatient physical therapy with primary complaint of chronic low back pain and radiating pain into her lower extremities of several years duration. Upon examination, noted decreased lumbar AROM. In addition, noted weakness in bilateral lower extremities particularly in proximal muscles. Patient reported increased tenderness to palpation of thoracic and lumbar paraspinals. In addition, patient demonstrated decreased balance with single limb stance and also noted deficits in gait mechanics when patient ambulated into and out of the clinic as well as use of upper extremities with transfers. Patient would benefit from skilled physical therapy in order to address the abovementioned deficits and help patient return to her prior level of function.     History and Personal Factors relevant to plan of care:  History of CVA, HTN, DMII, Diabetic Neuropathy, CKD III    Clinical Presentation  Stable    Clinical Presentation due to:  MMT, SLS, FOTO, clinical judgement    Clinical Decision Making  Moderate    Rehab Potential  Fair     Clinical Impairments Affecting Rehab Potential  Positive: Motivated; Negative: Comorbidites and chronicity of issue    PT Frequency  2x / week    PT Duration  4 weeks    PT Treatment/Interventions  ADLs/Self Care Home Management;Moist Heat;DME Instruction;Gait training;Stair training;Functional mobility training;Therapeutic activities;Therapeutic exercise;Balance training;Neuromuscular re-education;Patient/family education;Orthotic Fit/Training;Manual techniques;Passive range of motion;Dry needling;Energy conservation;Taping    PT Next Visit Plan  Review evaluation goals, initiate HEP including core exercises, flexion based stretches for lumbar, assess hamstring length, reflexes as needed    PT Home Exercise Plan  initiate at first treatment    Consulted and Agree with Plan of Care  Patient       Patient will benefit from skilled therapeutic intervention in order to improve the following deficits and impairments:  Abnormal gait, Decreased balance, Decreased endurance, Decreased mobility, Difficulty walking, Increased muscle spasms, Decreased range of motion, Improper body mechanics, Decreased activity tolerance, Decreased strength, Increased fascial restricitons, Postural dysfunction, Pain  Visit Diagnosis: Chronic bilateral low back pain, unspecified whether sciatica present  Other symptoms and signs involving the musculoskeletal system     Problem List Patient Active Problem List   Diagnosis Date Noted  . Type 2 diabetes mellitus with diabetic neuropathy, unspecified ([place]) 09/08/2017  . Special screening for malignant neoplasms, colon 07/06/2016  . Diabetic neuropathy ([place]) 11/09/2015  . Multinodular goiter (nontoxic) 09/20/2013  . Hypertriglyceridemia 08/05/2013  . Back pain 04/16/2013  . Diabetes mellitus, insulin dependent (IDDM), controlled ([place]) 12/07/2012  . 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  . CVA (cerebral vascular accident) ([place]) 07/23/2011  . CKD (chronic kidney disease)  stage 3, GFR 30-59 ml/min ([facility]) 11/07/2009  . SLEEP APNEA 09/06/2009  . SKIN TAG 07/14/2009  . Hyperuricemia 11/04/2008  . Hyperlipidemia LDL goal <100 07/02/2007  . Morbid obesity ([place]) 07/02/2007  . Essential hypertension 07/02/2007   Clarene Critchley PT, DPT 9:26 AM, 03/05/18 [place]  [facility] [address] [place], [place], [address] Phone: [phone]   Fax:  [phone]  Name: Krista Strickland MRN: [id number] Date of Birth: [birth year]

## 2018-03-06 ENCOUNTER — Ambulatory Visit (HOSPITAL_COMMUNITY): Payer: PPO | Admitting: Physical Therapy

## 2018-03-06 DIAGNOSIS — M545 Low back pain: Secondary | ICD-10-CM | POA: Diagnosis not present

## 2018-03-06 DIAGNOSIS — R29898 Other symptoms and signs involving the musculoskeletal system: Secondary | ICD-10-CM

## 2018-03-06 DIAGNOSIS — G8929 Other chronic pain: Secondary | ICD-10-CM

## 2018-03-06 NOTE — Therapy (Addendum)
[place], [place], [address] Phone: [phone]   Fax:  [phone]  Physical Therapy Treatment  Patient Details  Name: Krista Strickland MRN: [id number] Date of Birth: 28-Jun-1950 Referring Provider (PT): Sanjuana Kava, MD   Encounter Date: 03/06/2018  PT End of Session - 03/06/18 1418    Visit Number  2    Number of Visits  9    Date for PT Re-Evaluation  04/02/18    Authorization Type  [employer] (No visit limit)    Authorization Time Period  03/05/18 - 04/02/18    Authorization - Visit Number  2    Authorization - Number of Visits  10    PT Start Time  0912    PT Stop Time  0950    PT Time Calculation (min)  38 min    Activity Tolerance  Patient tolerated treatment well;No increased pain    Behavior During Therapy  WFL for tasks assessed/performed       Past Medical History:  Diagnosis Date  . Arthritis   . Cancer ([place])    uterine  . Chronic kidney disease   . Coronary artery disease   . Diabetes mellitus, type 2 ([place])   . GERD (gastroesophageal reflux disease)    no medication in 2017  . Gout   . History of MRSA infection 03/2009   pt denies this  . Hypercalcemia 2017   managed by nephrology  . Hyperlipidemia   . Hypertension   . Obesity   . Psoriasis   . Stroke [facility]) 2013   "light"    Past Surgical History:  Procedure Laterality Date  . ABDOMINAL HYSTERECTOMY    . APPENDECTOMY  2012  . COLONOSCOPY WITH PROPOFOL N/A 08/24/2016   Procedure: COLONOSCOPY WITH PROPOFOL;  Surgeon: Rogene Houston, MD;  Location[facility];  Service: Endoscopy;  Laterality: N/A;  10:30  . excison of rt breast cyst    . LAPAROSCOPIC SALPINGO OOPHERECTOMY Right 04/22/2012   Procedure: LAPAROSCOPIC SALPINGO OOPHORECTOMY;  Surgeon: Jonnie Kind, MD;  Location: [facility] ORS;  Service: Gynecology;  Laterality: Right;  end 11:17  . MASS EXCISION N/A 04/22/2012   Procedure: EXCISION SKIN TAGS NECK AND HEAD;   Surgeon: Jonnie Kind, MD;  Location: [facility] ORS;  Service: Gynecology;  Laterality: N/A;  start 11:19  . PARTIAL HYSTERECTOMY    . POLYPECTOMY  08/24/2016   Procedure: POLYPECTOMY;  Surgeon: Rogene Houston, MD;  Location[facility];  Service: Endoscopy;;  colon  . rt,. neck biopsy      There were no vitals filed for this visit.  Subjective Assessment - 03/06/18 1415    Subjective  pt late for appt today.  states when she walks straight up it hurts so she has to lean forward a little.  Currently having 5/10 pain today when she's walking, no pain at rest.                        [facility] Adult PT Treatment/Exercise - 03/06/18 0001      Lumbar Exercises: Stretches   Single Knee to Chest Stretch  Right;Left;2 reps;20 seconds      Lumbar Exercises: Standing   Other Standing Lumbar Exercises  3D hip excursion 10 reps each      Lumbar Exercises: Supine   Clam  10 reps    Bent Knee Raise  10 reps    Bridge  10  reps             PT Education - 03/06/18 1413    Education Details  reviewed goals with POC moving forward.  initiated HEP and given handouts    Person(s) Educated  Patient    Methods  Explanation;Handout;Demonstration;Tactile cues;Verbal cues    Comprehension  Verbalized understanding;Returned demonstration       PT Short Term Goals - 03/06/18 0940      PT SHORT TERM GOAL #1   Title  Patient will report understanding and regular compliance with HEP in order to improve, strength, balance, functional mobility, and decrease back pain.     Time  2    Period  Weeks    Status  On-going      PT SHORT TERM GOAL #2   Title  Patient will demonstrate ability to maintain single limb stance on each lower extremity for at least 5 seconds on each lower extremity indicating improved stability and balance to negotiate stairs and curbs.     Time  2    Period  Weeks    Status  On-going        PT Long Term Goals - 03/06/18 0940      PT LONG TERM GOAL #1    Title  Patient will demonstrate improvement of at least 1/2 MMT strength grade in all deficient muscle groups in order to improve mechanics with transfers and ambulation.     Time  4    Period  Weeks    Status  On-going      PT LONG TERM GOAL #2   Title  Patient will report ability to stand for at least 20 minutes in order to prepare a meal at home without requiring to sit.     Time  4    Period  Weeks    Status  On-going      PT LONG TERM GOAL #3   Title  Patient will report that her pain has not exceeded a 3/10 over the course of a 1 week period indicating improved tolerance to daily activities.     Time  4    Period  Weeks    Status  On-going            Plan - 03/06/18 1419    Clinical Impression Statement  reveiwed goals and explained POC moving forward.  INitiated HEP this session to include stabilization of core and lumbar mobility.  Pt able to demonstrate correctly and without pain or issues.  Pt given written HEP instructions.    Rehab Potential  Fair    Clinical Impairments Affecting Rehab Potential  Positive: Motivated; Negative: Comorbidites and chronicity of issue    PT Frequency  2x / week    PT Duration  4 weeks    PT Treatment/Interventions  ADLs/Self Care Home Management;Moist Heat;DME Instruction;Gait training;Stair training;Functional mobility training;Therapeutic activities;Therapeutic exercise;Balance training;Neuromuscular re-education;Patient/family education;Orthotic Fit/Training;Manual techniques;Passive range of motion;Dry needling;Energy conservation;Taping    PT Next Visit Plan  contiue with flexion based stretches for lumbar.  Begin static balance actvities, add hamsttring stretch and additional core exercises.     PT Home Exercise Plan  2/6: bridges, single knee to chest    Consulted and Agree with Plan of Care  Patient       Patient will benefit from skilled therapeutic intervention in order to improve the following deficits and impairments:   Abnormal gait, Decreased balance, Decreased endurance, Decreased mobility, Difficulty walking, Increased muscle spasms, Decreased range of  motion, Improper body mechanics, Decreased activity tolerance, Decreased strength, Increased fascial restricitons, Postural dysfunction, Pain  Visit Diagnosis: Chronic bilateral low back pain, unspecified whether sciatica present  Other symptoms and signs involving the musculoskeletal system     Problem List Patient Active Problem List   Diagnosis Date Noted  . Type 2 diabetes mellitus with diabetic neuropathy, unspecified ([place]) 09/08/2017  . Special screening for malignant neoplasms, colon 07/06/2016  . Diabetic neuropathy ([place]) 11/09/2015  . Multinodular goiter (nontoxic) 09/20/2013  . Hypertriglyceridemia 08/05/2013  . Back pain 04/16/2013  . Diabetes mellitus, insulin dependent (IDDM), controlled ([place]) 12/07/2012  . 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  . CVA (cerebral vascular accident) ([place]) 07/23/2011  . CKD (chronic kidney disease) stage 3, GFR 30-59 ml/min ([facility]) 11/07/2009  . SLEEP APNEA 09/06/2009  . SKIN TAG 07/14/2009  . Hyperuricemia 11/04/2008  . Hyperlipidemia LDL goal <100 07/02/2007  . Morbid obesity ([place]) 07/02/2007  . Essential hypertension 07/02/2007   Krista Strickland, PTA/CLT [phone]  Krista Strickland 03/06/2018, 5:17 PM  [facility] [address] [place], [place], [address] Phone: [phone]   Fax:  [phone]  Name: Krista Strickland MRN: [id number] Date of Birth: Sep 16, 1950

## 2018-03-06 NOTE — Patient Instructions (Signed)
Bridge [employer] small of back into mat, maintain pelvic tilt, roll up one vertebrae at a time. Focus on engaging posterior hip muscles. Hold for _3___seconds. Repeat _10___ times.    Knee-to-Chest Stretch: Unilateral    With hand behind right knee, pull knee in to chest until a comfortable stretch is felt in lower back and buttocks. Keep back relaxed. Hold _20___ seconds. Repeat _5___ times per set. Do __2__ sessions per day.

## 2018-03-10 DIAGNOSIS — E119 Type 2 diabetes mellitus without complications: Secondary | ICD-10-CM | POA: Diagnosis not present

## 2018-03-10 DIAGNOSIS — H524 Presbyopia: Secondary | ICD-10-CM | POA: Diagnosis not present

## 2018-03-10 DIAGNOSIS — E1140 Type 2 diabetes mellitus with diabetic neuropathy, unspecified: Secondary | ICD-10-CM | POA: Diagnosis not present

## 2018-03-10 DIAGNOSIS — I951 Orthostatic hypotension: Secondary | ICD-10-CM | POA: Diagnosis not present

## 2018-03-10 DIAGNOSIS — H5203 Hypermetropia, bilateral: Secondary | ICD-10-CM | POA: Diagnosis not present

## 2018-03-10 DIAGNOSIS — I69898 Other sequelae of other cerebrovascular disease: Secondary | ICD-10-CM | POA: Diagnosis not present

## 2018-03-10 DIAGNOSIS — Z794 Long term (current) use of insulin: Secondary | ICD-10-CM | POA: Diagnosis not present

## 2018-03-10 DIAGNOSIS — R2689 Other abnormalities of gait and mobility: Secondary | ICD-10-CM | POA: Diagnosis not present

## 2018-03-10 DIAGNOSIS — H2513 Age-related nuclear cataract, bilateral: Secondary | ICD-10-CM | POA: Diagnosis not present

## 2018-03-10 LAB — HM DIABETES EYE EXAM

## 2018-03-11 ENCOUNTER — Encounter (HOSPITAL_COMMUNITY): Payer: Self-pay | Admitting: Physical Therapy

## 2018-03-11 ENCOUNTER — Ambulatory Visit (HOSPITAL_COMMUNITY): Payer: PPO | Admitting: Physical Therapy

## 2018-03-11 DIAGNOSIS — M545 Low back pain: Principal | ICD-10-CM

## 2018-03-11 DIAGNOSIS — G8929 Other chronic pain: Secondary | ICD-10-CM

## 2018-03-11 DIAGNOSIS — R29898 Other symptoms and signs involving the musculoskeletal system: Secondary | ICD-10-CM

## 2018-03-11 NOTE — Therapy (Signed)
[place], [place], [address] Phone: [phone]   Fax:  [phone]  Physical Therapy Treatment  Patient Details  Name: Krista Strickland MRN: [id number] Date of Birth: Jun 18, 1950 Referring Provider (PT): Sanjuana Kava, MD   Encounter Date: 03/11/2018  PT End of Session - 03/11/18 0958    Visit Number  3    Number of Visits  9    Date for PT Re-Evaluation  04/02/18    Authorization Type  [employer] (No visit limit)    Authorization Time Period  03/05/18 - 04/02/18    Authorization - Visit Number  3    Authorization - Number of Visits  10    PT Start Time  0950    PT Stop Time  1028    PT Time Calculation (min)  38 min    Activity Tolerance  Patient tolerated treatment well;No increased pain    Behavior During Therapy  WFL for tasks assessed/performed       Past Medical History:  Diagnosis Date  . Arthritis   . Cancer ([place])    uterine  . Chronic kidney disease   . Coronary artery disease   . Diabetes mellitus, type 2 ([place])   . GERD (gastroesophageal reflux disease)    no medication in 2017  . Gout   . History of MRSA infection 03/2009   pt denies this  . Hypercalcemia 2017   managed by nephrology  . Hyperlipidemia   . Hypertension   . Obesity   . Psoriasis   . Stroke [facility]) 2013   "light"    Past Surgical History:  Procedure Laterality Date  . ABDOMINAL HYSTERECTOMY    . APPENDECTOMY  2012  . COLONOSCOPY WITH PROPOFOL N/A 08/24/2016   Procedure: COLONOSCOPY WITH PROPOFOL;  Surgeon: Rogene Houston, MD;  Location: [facility];  Service: Endoscopy;  Laterality: N/A;  10:30  . excison of rt breast cyst    . LAPAROSCOPIC SALPINGO OOPHERECTOMY Right 04/22/2012   Procedure: LAPAROSCOPIC SALPINGO OOPHORECTOMY;  Surgeon: Jonnie Kind, MD;  Location: [facility] ORS;  Service: Gynecology;  Laterality: Right;  end 11:17  . MASS EXCISION N/A 04/22/2012   Procedure: EXCISION SKIN TAGS NECK AND HEAD;   Surgeon: Jonnie Kind, MD;  Location: [facility] ORS;  Service: Gynecology;  Laterality: N/A;  start 11:19  . PARTIAL HYSTERECTOMY    . POLYPECTOMY  08/24/2016   Procedure: POLYPECTOMY;  Surgeon: Rogene Houston, MD;  Location: [facility];  Service: Endoscopy;;  colon  . rt,. neck biopsy      There were no vitals filed for this visit.  Subjective Assessment - 03/11/18 0951    Subjective  Patient reported that she has done her exercises a litttle bit. She stated her back pain is a 5/10.     Currently in Pain?  Yes    Pain Score  5     Pain Location  Back    Pain Orientation  Right;Lower    Pain Descriptors / Indicators  Aching    Pain Type  Chronic pain                       [facility] Adult PT Treatment/Exercise - 03/11/18 0001      Lumbar Exercises: Stretches   Passive Hamstring Stretch  Right;Left;3 reps;20 seconds    Single Knee to Chest Stretch  Right;Left;20 seconds;3 reps      Lumbar Exercises: Standing  Other Standing Lumbar Exercises  3D hip excursion 10 reps each: Flexion/extension, left/right sidebend, rotation right/left      Lumbar Exercises: Supine   Ab Set  15 reps    AB Set Limitations  5'' holds    Clam  10 reps    Clam Limitations  VCs for proper form    Bent Knee Raise  10 reps    Bent Knee Raise Limitations  10x alternating legs    Dead Bug  10 reps    Bridge  10 reps             PT Education - 03/11/18 0958    Education Details  Explained purpose and technique of interventions throughout session.     Person(s) Educated  Patient    Methods  Explanation    Comprehension  Verbalized understanding       PT Short Term Goals - 03/06/18 0940      PT SHORT TERM GOAL #1   Title  Patient will report understanding and regular compliance with HEP in order to improve, strength, balance, functional mobility, and decrease back pain.     Time  2    Period  Weeks    Status  On-going      PT SHORT TERM GOAL #2   Title  Patient will  demonstrate ability to maintain single limb stance on each lower extremity for at least 5 seconds on each lower extremity indicating improved stability and balance to negotiate stairs and curbs.     Time  2    Period  Weeks    Status  On-going        PT Long Term Goals - 03/06/18 0940      PT LONG TERM GOAL #1   Title  Patient will demonstrate improvement of at least 1/2 MMT strength grade in all deficient muscle groups in order to improve mechanics with transfers and ambulation.     Time  4    Period  Weeks    Status  On-going      PT LONG TERM GOAL #2   Title  Patient will report ability to stand for at least 20 minutes in order to prepare a meal at home without requiring to sit.     Time  4    Period  Weeks    Status  On-going      PT LONG TERM GOAL #3   Title  Patient will report that her pain has not exceeded a 3/10 over the course of a 1 week period indicating improved tolerance to daily activities.     Time  4    Period  Weeks    Status  On-going            Plan - 03/11/18 0959    Clinical Impression Statement  This session continued with established plan of care. Focused on improving patient's mobility and core strengthening this session. This session added hamstring stretch, and progressed core strengthening with dead bug exercises. Patient tolerated all exercises well. Plan to progress patient as tolerated to reach functional goals.     Rehab Potential  Fair    Clinical Impairments Affecting Rehab Potential  Positive: Motivated; Negative: Comorbidites and chronicity of issue    PT Frequency  2x / week    PT Duration  4 weeks    PT Treatment/Interventions  ADLs/Self Care Home Management;Moist Heat;DME Instruction;Gait training;Stair training;Functional mobility training;Therapeutic activities;Therapeutic exercise;Balance training;Neuromuscular re-education;Patient/family education;Orthotic Fit/Training;Manual techniques;Passive range of motion;Dry needling;Energy  conservation;Taping    PT Next Visit Plan  contiue with flexion based stretches for lumbar. Progress to static balance actvities, progress core strengthening in standing  next session    PT Home Exercise Plan  2/6: bridges, single knee to chest    Consulted and Agree with Plan of Care  Patient       Patient will benefit from skilled therapeutic intervention in order to improve the following deficits and impairments:  Abnormal gait, Decreased balance, Decreased endurance, Decreased mobility, Difficulty walking, Increased muscle spasms, Decreased range of motion, Improper body mechanics, Decreased activity tolerance, Decreased strength, Increased fascial restricitons, Postural dysfunction, Pain  Visit Diagnosis: Chronic bilateral low back pain, unspecified whether sciatica present  Other symptoms and signs involving the musculoskeletal system     Problem List Patient Active Problem List   Diagnosis Date Noted  . Type 2 diabetes mellitus with diabetic neuropathy, unspecified ([place]) 09/08/2017  . Special screening for malignant neoplasms, colon 07/06/2016  . Diabetic neuropathy ([place]) 11/09/2015  . Multinodular goiter (nontoxic) 09/20/2013  . Hypertriglyceridemia 08/05/2013  . Back pain 04/16/2013  . Diabetes mellitus, insulin dependent (IDDM), controlled ([place]) 12/07/2012  . 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  . CVA (cerebral vascular accident) ([place]) 07/23/2011  . CKD (chronic kidney disease) stage 3, GFR 30-59 ml/min (HCC) 11/07/2009  . SLEEP APNEA 09/06/2009  . SKIN TAG 07/14/2009  . Hyperuricemia 11/04/2008  . Hyperlipidemia LDL goal <100 07/02/2007  . Morbid obesity ([place]) 07/02/2007  . Essential hypertension 07/02/2007   Clarene Critchley PT, DPT 10:29 AM, 03/11/18 [place], [place], [address] Phone: [phone]   Fax:  [phone]  Name: Krista Strickland MRN: [id number] Date of Birth:  [birth year]

## 2018-03-13 ENCOUNTER — Ambulatory Visit (HOSPITAL_COMMUNITY): Payer: PPO | Admitting: Physical Therapy

## 2018-03-13 ENCOUNTER — Encounter (HOSPITAL_COMMUNITY): Payer: Self-pay | Admitting: Physical Therapy

## 2018-03-13 DIAGNOSIS — M545 Low back pain: Secondary | ICD-10-CM | POA: Diagnosis not present

## 2018-03-13 DIAGNOSIS — G8929 Other chronic pain: Secondary | ICD-10-CM

## 2018-03-13 DIAGNOSIS — R29898 Other symptoms and signs involving the musculoskeletal system: Secondary | ICD-10-CM

## 2018-03-13 NOTE — Patient Instructions (Signed)
  Hamstring Stretch - Supine    Lie on back, uninvolved leg raised toward ceiling, towel around knee. Pull thigh toward chest until a stretch is felt on back of thigh. Hold for _30__ seconds. If possible, move towel up to calf and repeat. Repeat on involved leg. Repeat _3__ times. Do _1-2__ times per day.  Copyright  V[employer]. All rights reserved.

## 2018-03-13 NOTE — Therapy (Signed)
[place], [place], [address] Phone: [phone]   Fax:  [phone]  Physical Therapy Treatment  Patient Details  Name: Krista Strickland MRN: [id number] Date of Birth: [birth year] Referring Provider (PT): Sanjuana Kava, MD   Encounter Date: 03/13/2018  PT End of Session - 03/13/18 1003    Visit Number  4    Number of Visits  9    Date for PT Re-Evaluation  04/02/18    Authorization Type  [employer] (No visit limit)    Authorization Time Period  03/05/18 - 04/02/18    Authorization - Visit Number  4    Authorization - Number of Visits  10    PT Start Time  0950    PT Stop Time  1028    PT Time Calculation (min)  38 min    Activity Tolerance  Patient tolerated treatment well;No increased pain    Behavior During Therapy  WFL for tasks assessed/performed       Past Medical History:  Diagnosis Date  . Arthritis   . Cancer ([place])    uterine  . Chronic kidney disease   . Coronary artery disease   . Diabetes mellitus, type 2 ([place])   . GERD (gastroesophageal reflux disease)    no medication in 2017  . Gout   . History of MRSA infection 03/2009   pt denies this  . Hypercalcemia 2017   managed by nephrology  . Hyperlipidemia   . Hypertension   . Obesity   . Psoriasis   . Stroke [facility]) 2013   "light"    Past Surgical History:  Procedure Laterality Date  . ABDOMINAL HYSTERECTOMY    . APPENDECTOMY  2012  . COLONOSCOPY WITH PROPOFOL N/A 08/24/2016   Procedure: COLONOSCOPY WITH PROPOFOL;  Surgeon: Rogene Houston, MD;  [facility];  Service: Endoscopy;  Laterality: N/A;  10:30  . excison of rt breast cyst    . LAPAROSCOPIC SALPINGO OOPHERECTOMY Right 04/22/2012   Procedure: LAPAROSCOPIC SALPINGO OOPHORECTOMY;  Surgeon: Jonnie Kind, MD;  Location: [facility] ORS;  Service: Gynecology;  Laterality: Right;  end 11:17  . MASS EXCISION N/A 04/22/2012   Procedure: EXCISION SKIN TAGS NECK AND HEAD;   Surgeon: Jonnie Kind, MD;  Location: [facility];  Service: Gynecology;  Laterality: N/A;  start 11:19  . PARTIAL HYSTERECTOMY    . POLYPECTOMY  08/24/2016   Procedure: POLYPECTOMY;  Surgeon: Rogene Houston, MD;  [facility];  Service: Endoscopy;;  colon  . rt,. neck biopsy      There were no vitals filed for this visit.  Subjective Assessment - 03/13/18 0955    Subjective  Patient stated she has been trying to do her exercises and that her back pain is currently a 5/10.     Currently in Pain?  Yes    Pain Score  5     Pain Location  Back    Pain Orientation  Right;Lower    Pain Descriptors / Indicators  Aching    Pain Type  Chronic pain                       [facility] Adult PT Treatment/Exercise - 03/13/18 0001      Lumbar Exercises: Stretches   Passive Hamstring Stretch  Right;Left;3 reps;30 seconds    Single Knee to Chest Stretch  Right;Left;20 seconds;3 reps      Lumbar Exercises: Supine  Ab Set  15 reps    AB Set Limitations  5'' holds    Bent Knee Raise  10 reps    Bent Knee Raise Limitations  10x alternating legs    Dead Bug  10 reps      Lumbar Exercises: Sidelying   Clam  Right;Left;10 reps      Manual Therapy   Manual Therapy  Soft tissue mobilization    Manual therapy comments  All manual completed separately from other skilled interventions    Soft tissue mobilization  Patient prone with ankles rested on soft bolster STM to bilateral thoracic and lumbar paraspinals to decrease pain and muscular restrictions               PT Short Term Goals - 03/06/18 0940      PT SHORT TERM GOAL #1   Title  Patient will report understanding and regular compliance with HEP in order to improve, strength, balance, functional mobility, and decrease back pain.     Time  2    Period  Weeks    Status  On-going      PT SHORT TERM GOAL #2   Title  Patient will demonstrate ability to maintain single limb stance on each lower extremity for at least  5 seconds on each lower extremity indicating improved stability and balance to negotiate stairs and curbs.     Time  2    Period  Weeks    Status  On-going        PT Long Term Goals - 03/06/18 0940      PT LONG TERM GOAL #1   Title  Patient will demonstrate improvement of at least 1/2 MMT strength grade in all deficient muscle groups in order to improve mechanics with transfers and ambulation.     Time  4    Period  Weeks    Status  On-going      PT LONG TERM GOAL #2   Title  Patient will report ability to stand for at least 20 minutes in order to prepare a meal at home without requiring to sit.     Time  4    Period  Weeks    Status  On-going      PT LONG TERM GOAL #3   Title  Patient will report that her pain has not exceeded a 3/10 over the course of a 1 week period indicating improved tolerance to daily activities.     Time  4    Period  Weeks    Status  On-going            Plan - 03/13/18 1001    Clinical Impression Statement  This session continued with established plan of care. Progressed patient with clams in sidelying as opposed to supine. This session ended with soft tissue mobilization for patient's thoracic and lumbar paraspinals to address the muscular restrictions and spasms in her back. Patient reported a reduction in pain following manual therapy, however did not quantify the pain. This session added hamstring stretch to patient's HEP.     Rehab Potential  Fair    Clinical Impairments Affecting Rehab Potential  Positive: Motivated; Negative: Comorbidites and chronicity of issue    PT Frequency  2x / week    PT Duration  4 weeks    PT Treatment/Interventions  ADLs/Self Care Home Management;Moist Heat;DME Instruction;Gait training;Stair training;Functional mobility training;Therapeutic activities;Therapeutic exercise;Balance training;Neuromuscular re-education;Patient/family education;Orthotic Fit/Training;Manual techniques;Passive range of motion;Dry  needling;Energy conservation;Taping  PT Next Visit Plan  contiue with flexion based stretches for lumbar. Progress to static balance actvities, progress core strengthening in standing as able    PT Home Exercise Plan  2/6: bridges, single knee to chest; 03/13/18: Hamstring stretch supine with towel 3x30'' 1-2x/day    Consulted and Agree with Plan of Care  Patient       Patient will benefit from skilled therapeutic intervention in order to improve the following deficits and impairments:  Abnormal gait, Decreased balance, Decreased endurance, Decreased mobility, Difficulty walking, Increased muscle spasms, Decreased range of motion, Improper body mechanics, Decreased activity tolerance, Decreased strength, Increased fascial restricitons, Postural dysfunction, Pain  Visit Diagnosis: Chronic bilateral low back pain, unspecified whether sciatica present  Other symptoms and signs involving the musculoskeletal system     Problem List Patient Active Problem List   Diagnosis Date Noted  . Type 2 diabetes mellitus with diabetic neuropathy, unspecified ([place]) 09/08/2017  . Special screening for malignant neoplasms, colon 07/06/2016  . Diabetic neuropathy ([place]) 11/09/2015  . Multinodular goiter (nontoxic) 09/20/2013  . Hypertriglyceridemia 08/05/2013  . Back pain 04/16/2013  . Diabetes mellitus, insulin dependent (IDDM), controlled ([place]) 12/07/2012  . 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  . CVA (cerebral vascular accident) ([place]) 07/23/2011  . CKD (chronic kidney disease) stage 3, GFR 30-59 ml/min (HCC) 11/07/2009  . SLEEP APNEA 09/06/2009  . SKIN TAG 07/14/2009  . Hyperuricemia 11/04/2008  . Hyperlipidemia LDL goal <100 07/02/2007  . Morbid obesity ([place]) 07/02/2007  . Essential hypertension 07/02/2007   Clarene Critchley PT, DPT 11:31 AM, 03/13/18 [place], [place], [address] Phone: [phone]   Fax:   [phone]  Name: Krista Strickland MRN: [id number] Date of Birth: Dec 23, 1950

## 2018-03-14 ENCOUNTER — Encounter: Payer: Self-pay | Admitting: Family Medicine

## 2018-03-14 NOTE — Progress Notes (Signed)
Krista Strickland     MRN: [id number]      DOB: [birth year]  HPI: Patient is in for annual physical exam. C/o worsening back pain radiating to groin and legs, disabling rated between 8 to 10 at times Has questions about renal function and management , I explained as able and advised she directly address with Nephrology   PE: BP 122/64   Pulse 88   Resp 15   Ht 5\' 3"  (1.6 m)   Wt 199 lb (90.3 kg)   SpO2 96%   BMI 35.25 kg/m   Pt in pain  Pleasant  female, alert and oriented x 3, in no cardio-pulmonary distress. Afebrile. HEENT No facial trauma or asymetry. Sinuses non tender.  Extra occullar muscles intact, External ears normal, tympanic membranes clear. Oropharynx moist, no exudate. Neck: supple, no adenopathy,JVD or thyromegaly.No bruits.  Chest: Clear to ascultation bilaterally.No crackles or wheezes. Non tender to palpation  Breast: No asymetry,no masses or lumps. No tenderness. No nipple discharge or inversion. No axillary or supraclavicular adenopathy  Cardiovascular system; Heart sounds normal,  S1 and  S2 ,no S3.  No murmur, or thrill. Apical beat not displaced Peripheral pulses normal.  Abdomen: Soft, non tender, no organomegaly or masses. No bruits. Bowel sounds normal. No guarding, tenderness or rebound.  discharge noted from os Uterus normal size, no adnexal masses, no cervical motion or adnexal tenderness.   Musculoskeletal exam: Markedly decreased  ROM of lumbar spine,adequate in  hips , shoulders and knees. No deformity ,swelling or crepitus noted.  muscle wasting and  atrophy. noted  Neurologic: Cranial nerves 2 to 12 intact. Power, tone ,sensation and reflexes reduced in lower extremities.  disturbance in gait. No tremor.  Skin: Intact, no ulceration, erythema , scaling or rash noted. Pigmentation normal throughout  Psych; Anxious. Judgement and concentration normal   Assessment & Plan:  Back pain Increased and uncontrolled back  pain, refer to Ortho  Annual physical exam Annual exam as documented. Counseling done  re healthy lifestyle involving commitment to 150 minutes exercise per week, heart healthy diet, and attaining healthy weight.The importance of adequate sleep also discussed. Changes in health habits are decided on by the patient with goals and time frames  set for achieving them. Immunization and cancer screening needs are specifically addressed at this visit.   Type 2 diabetes mellitus with hyperglycemia (HCC) Deteriorated and uncontrolled, managed by Endo, re educated re need for carb counting and medication adherence   Morbid obesity Deteriorated.Obesity linked with IDDm and hypertension  Patient re-educated about  the importance of commitment to a  minimum of 150 minutes of exercise per week as able.  The importance of healthy food choices with portion control discussed, as well as eating regularly and within a 12 hour window most days. The need to choose "clean , green" food 50 to 75% of the time is discussed, as well as to make water the primary drink and set a goal of 64 ounces water daily.  Encouraged to start a food diary,  and to consider  joining a support group. Sample diet sheets offered. Goals set by the patient for the next several months.   Weight /BMI 02/26/2018 02/18/2018 02/12/2018  WEIGHT 201 lb 199 lb 6.4 oz 199 lb  HEIGHT 5\' 3"  5\' 3"  5\' 3"   BMI 35.61 kg/m2 35.32 kg/m2 35.25 kg/m2      CKD (chronic kidney disease) stage 3, GFR 30-59 ml/min Deterioration in renal function , needs to have  better understanding of her disease process and I have encouraged to fully address with Nephrology. I started education re need for dialysis should her renal function continue to deteriorate and the  Need for control of her blood sugar to address this  Essential hypertension Controlled, no change in medication DASH diet and commitment to daily physical activity for a minimum of 30 minutes  discussed and encouraged, as a part of hypertension management. The importance of attaining a healthy weight is also discussed.  BP/Weight 02/26/2018 02/18/2018 02/12/2018 11/18/2017 09/17/2017 09/02/2017 0/86/7619  Systolic BP 509 326 712 458 099 833 825  Diastolic BP 64 64 64 60 62 78 70  Wt. (Lbs) 201 199.4 199 202.6 202.4 201.12 203  BMI 35.61 35.32 35.25 35.89 35.85 35.63 32.77

## 2018-03-14 NOTE — Assessment & Plan Note (Signed)
Controlled, no change in medication DASH diet and commitment to daily physical activity for a minimum of 30 minutes discussed and encouraged, as a part of hypertension management. The importance of attaining a healthy weight is also discussed.  BP/Weight 02/26/2018 02/18/2018 02/12/2018 11/18/2017 09/17/2017 09/02/2017 7/37/1062  Systolic BP 694 854 627 035 009 381 829  Diastolic BP 64 64 64 60 62 78 70  Wt. (Lbs) 201 199.4 199 202.6 202.4 201.12 203  BMI 35.61 35.32 35.25 35.89 35.85 35.63 32.77

## 2018-03-14 NOTE — Assessment & Plan Note (Signed)
Deteriorated.Obesity linked with IDDm and hypertension  Patient re-educated about  the importance of commitment to a  minimum of 150 minutes of exercise per week as able.  The importance of healthy food choices with portion control discussed, as well as eating regularly and within a 12 hour window most days. The need to choose "clean , green" food 50 to 75% of the time is discussed, as well as to make water the primary drink and set a goal of 64 ounces water daily.  Encouraged to start a food diary,  and to consider  joining a support group. Sample diet sheets offered. Goals set by the patient for the next several months.   Weight /BMI 02/26/2018 02/18/2018 02/12/2018  WEIGHT 201 lb 199 lb 6.4 oz 199 lb  HEIGHT 5\' 3"  5\' 3"  5\' 3"   BMI 35.61 kg/m2 35.32 kg/m2 35.25 kg/m2

## 2018-03-14 NOTE — Assessment & Plan Note (Signed)
Annual exam as documented. Counseling done  re healthy lifestyle involving commitment to 150 minutes exercise per week, heart healthy diet, and attaining healthy weight.The importance of adequate sleep also discussed. Changes in health habits are decided on by the patient with goals and time frames  set for achieving them. Immunization and cancer screening needs are specifically addressed at this visit. 

## 2018-03-14 NOTE — Assessment & Plan Note (Signed)
Deteriorated and uncontrolled, managed by Endo, re educated re need for carb counting and medication adherence

## 2018-03-14 NOTE — Assessment & Plan Note (Signed)
Deterioration in renal function , needs to have better understanding of her disease process and I have encouraged to fully address with Nephrology. I started education re need for dialysis should her renal function continue to deteriorate and the  Need for control of her blood sugar to address this

## 2018-03-18 ENCOUNTER — Encounter (HOSPITAL_COMMUNITY): Payer: Self-pay | Admitting: Physical Therapy

## 2018-03-18 ENCOUNTER — Ambulatory Visit (HOSPITAL_COMMUNITY): Payer: PPO | Admitting: Physical Therapy

## 2018-03-18 DIAGNOSIS — R29898 Other symptoms and signs involving the musculoskeletal system: Secondary | ICD-10-CM

## 2018-03-18 DIAGNOSIS — G8929 Other chronic pain: Secondary | ICD-10-CM

## 2018-03-18 DIAGNOSIS — M545 Low back pain: Secondary | ICD-10-CM | POA: Diagnosis not present

## 2018-03-18 NOTE — Therapy (Signed)
[place], [place], [address] Phone: [phone]   Fax:  [phone]  Physical Therapy Treatment  Patient Details  Name: Krista Strickland MRN: [id number] Date of Birth: 29-May-1950 Referring Provider (PT): Sanjuana Kava, MD   Encounter Date: 03/18/2018  PT End of Session - 03/18/18 0955    Visit Number  5    Number of Visits  9    Date for PT Re-Evaluation  04/02/18    Authorization Type  [employer] (No visit limit)    Authorization Time Period  03/05/18 - 04/02/18    Authorization - Visit Number  5    Authorization - Number of Visits  10    PT Start Time  0950    PT Stop Time  8546    PT Time Calculation (min)  39 min    Activity Tolerance  Patient tolerated treatment well;No increased pain    Behavior During Therapy  WFL for tasks assessed/performed       Past Medical History:  Diagnosis Date  . Arthritis   . Cancer ([place])    uterine  . Chronic kidney disease   . Coronary artery disease   . Diabetes mellitus, type 2 ([place])   . GERD (gastroesophageal reflux disease)    no medication in 2017  . Gout   . History of MRSA infection 03/2009   pt denies this  . Hypercalcemia 2017   managed by nephrology  . Hyperlipidemia   . Hypertension   . Obesity   . Psoriasis   . Stroke [facility]) 2013   "light"    Past Surgical History:  Procedure Laterality Date  . ABDOMINAL HYSTERECTOMY    . APPENDECTOMY  2012  . COLONOSCOPY WITH PROPOFOL N/A 08/24/2016   Procedure: COLONOSCOPY WITH PROPOFOL;  Surgeon: Rogene [place], MD;  Location: [facility];  Service: Endoscopy;  Laterality: N/A;  10:30  . excison of rt breast cyst    . LAPAROSCOPIC SALPINGO OOPHERECTOMY Right 04/22/2012   Procedure: LAPAROSCOPIC SALPINGO OOPHORECTOMY;  Surgeon: Jonnie Kind, MD;  Location: [facility] ORS;  Service: Gynecology;  Laterality: Right;  end 11:17  . MASS EXCISION N/A 04/22/2012   Procedure: EXCISION SKIN TAGS NECK AND HEAD;   Surgeon: Jonnie Kind, MD;  Location: [facility] ORS;  Service: Gynecology;  Laterality: N/A;  start 11:19  . PARTIAL HYSTERECTOMY    . POLYPECTOMY  08/24/2016   Procedure: POLYPECTOMY;  Surgeon: Rogene [place], MD;  Location: [facility];  Service: Endoscopy;;  colon  . rt,. neck biopsy      There were no vitals filed for this visit.  Subjective Assessment - 03/18/18 0952    Subjective  Patient reported that her back pain is an 8/10 today. She stated that she feels like the massage irritated the spasms.     Currently in Pain?  Yes    Pain Score  8     Pain Location  Back    Pain Orientation  Right;Lower    Pain Descriptors / Indicators  Aching    Pain Type  Chronic pain                       [facility] Adult PT Treatment/Exercise - 03/18/18 0001      Lumbar Exercises: Stretches   Passive Hamstring Stretch  Right;Left;3 reps;30 seconds    Single Knee to Chest Stretch  Right;Left;20 seconds;2 reps      Lumbar Exercises:  Supine   Other Supine Lumbar Exercises  Decompression exercises Supine relax on back focusing on breathing and posture 3 minutes; Head press 10x; shoulder press 10x; leg lengtheners 10x; leg presses x 10             PT Education - 03/18/18 0954    Education Details  Explained purpose and technique or interventions.     Person(s) Educated  Patient    Methods  Explanation    Comprehension  Verbalized understanding       PT Short Term Goals - 03/06/18 0940      PT SHORT TERM GOAL #1   Title  Patient will report understanding and regular compliance with HEP in order to improve, strength, balance, functional mobility, and decrease back pain.     Time  2    Period  Weeks    Status  On-going      PT SHORT TERM GOAL #2   Title  Patient will demonstrate ability to maintain single limb stance on each lower extremity for at least 5 seconds on each lower extremity indicating improved stability and balance to negotiate stairs and curbs.     Time  2     Period  Weeks    Status  On-going        PT Long Term Goals - 03/06/18 0940      PT LONG TERM GOAL #1   Title  Patient will demonstrate improvement of at least 1/2 MMT strength grade in all deficient muscle groups in order to improve mechanics with transfers and ambulation.     Time  4    Period  Weeks    Status  On-going      PT LONG TERM GOAL #2   Title  Patient will report ability to stand for at least 20 minutes in order to prepare a meal at home without requiring to sit.     Time  4    Period  Weeks    Status  On-going      PT LONG TERM GOAL #3   Title  Patient will report that her pain has not exceeded a 3/10 over the course of a 1 week period indicating improved tolerance to daily activities.     Time  4    Period  Weeks    Status  On-going            Plan - 03/18/18 1011    Clinical Impression Statement  Patient presented with increased pain this session. Modified exercises this session to address increase in pain. Discontinued single knee to chest exercises as it was increasing patient's pain. Included decompression exercises to improve patient's mobility and decrease pain. Patient required frequent verbal and tactile cues throughout for proper form. Patient reported a decrease in pain from 8/10 to a 7/10 at end of session. Patient would benefit from continued skilled physical therapy in order to continue progressing towards functional goals.     Rehab Potential  Fair    Clinical Impairments Affecting Rehab Potential  Positive: Motivated; Negative: Comorbidites and chronicity of issue    PT Frequency  2x / week    PT Duration  4 weeks    PT Treatment/Interventions  ADLs/Self Care Home Management;Moist Heat;DME Instruction;Gait training;Stair training;Functional mobility training;Therapeutic activities;Therapeutic exercise;Balance training;Neuromuscular re-education;Patient/family education;Orthotic Fit/Training;Manual techniques;Passive range of motion;Dry  needling;Energy conservation;Taping    PT Next Visit Plan   Progress to static balance actvities as patient's pain level decreases, progress core strengthening in standing  as able    PT Home Exercise Plan  2/6: bridges, single knee to chest; 03/13/18: Hamstring stretch supine with towel 3x30'' 1-2x/day    Consulted and Agree with Plan of Care  Patient       Patient will benefit from skilled therapeutic intervention in order to improve the following deficits and impairments:  Abnormal gait, Decreased balance, Decreased endurance, Decreased mobility, Difficulty walking, Increased muscle spasms, Decreased range of motion, Improper body mechanics, Decreased activity tolerance, Decreased strength, Increased fascial restricitons, Postural dysfunction, Pain  Visit Diagnosis: Chronic bilateral low back pain, unspecified whether sciatica present  Other symptoms and signs involving the musculoskeletal system     Problem List Patient Active Problem List   Diagnosis Date Noted  . Type 2 diabetes mellitus with diabetic neuropathy, unspecified ([place]) 09/08/2017  . Special screening for malignant neoplasms, colon 07/06/2016  . Diabetic neuropathy ([place]) 11/09/2015  . Annual physical exam 12/16/2013  . Multinodular goiter (nontoxic) 09/20/2013  . Hypertriglyceridemia 08/05/2013  . Back pain 04/16/2013  . Type 2 diabetes mellitus with hyperglycemia ([place]) 12/07/2012  . 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  . CVA (cerebral vascular accident) ([place]) 07/23/2011  . CKD (chronic kidney disease) stage 3, GFR 30-59 ml/min (HCC) 11/07/2009  . SLEEP APNEA 09/06/2009  . SKIN TAG 07/14/2009  . Hyperuricemia 11/04/2008  . Hyperlipidemia LDL goal <100 07/02/2007  . Morbid obesity ([place]) 07/02/2007  . Essential hypertension 07/02/2007   Clarene Critchley PT, DPT 10:30 AM, 03/18/18 [place], [place], [address] Phone: [phone]   Fax:   [phone]  Name: Krista Strickland MRN: [id number] Date of Birth: [birth year]

## 2018-03-19 ENCOUNTER — Ambulatory Visit: Payer: PPO | Admitting: Orthopaedic Surgery

## 2018-03-19 ENCOUNTER — Encounter: Payer: Self-pay | Admitting: Orthopaedic Surgery

## 2018-03-19 VITALS — BP 154/68 | HR 72 | Ht 63.0 in | Wt 206.0 lb

## 2018-03-19 DIAGNOSIS — M5442 Lumbago with sciatica, left side: Secondary | ICD-10-CM | POA: Diagnosis not present

## 2018-03-19 DIAGNOSIS — G609 Hereditary and idiopathic neuropathy, unspecified: Secondary | ICD-10-CM | POA: Diagnosis not present

## 2018-03-19 DIAGNOSIS — G8929 Other chronic pain: Secondary | ICD-10-CM

## 2018-03-19 MED ORDER — HYDROCODONE-ACETAMINOPHEN 5-325 MG PO TABS: ORAL_TABLET | ORAL | 0 refills | Status: DC

## 2018-03-19 NOTE — Progress Notes (Signed)
Patient Krista Strickland:GXQJJ SHANNAN SLINKER, female DOB:May 30, 1950, 68 y.o. HER:[id number]  Chief Complaint  Patient presents with  . Back Pain    low back pain    HPI  KORIANA Strickland is a 68 y.o. female who has lower back pain and peripheral neuropathy.  She has been to PT and it is helping.  I have reviewed the notes.  Her back pain is present but less. The cold rainy weather has made it worse.  She has no new trauma.   Body mass index is 36.49 kg/m.  ROS  Review of Systems  Constitutional: Positive for activity change.  Musculoskeletal: Positive for arthralgias, back pain, gait problem and joint swelling.  All other systems reviewed and are negative.   All other systems reviewed and are negative.  The following is a summary of the past history medically, past history surgically, known current medicines, social history and family history.  This information is gathered electronically by the computer from prior information and documentation.  I review this each visit and have found including this information at this point in the chart is beneficial and informative.    Past Medical History:  Diagnosis Date  . Arthritis   . Cancer ([place])    uterine  . Chronic kidney disease   . Coronary artery disease   . Diabetes mellitus, type 2 ([place])   . GERD (gastroesophageal reflux disease)    no medication in 2017  . Gout   . History of MRSA infection 03/2009   pt denies this  . Hypercalcemia 2017   managed by nephrology  . Hyperlipidemia   . Hypertension   . Obesity   . Psoriasis   . Stroke [facility]) 2013   "light"    Past Surgical History:  Procedure Laterality Date  . ABDOMINAL HYSTERECTOMY    . APPENDECTOMY  2012  . COLONOSCOPY WITH PROPOFOL N/A 08/24/2016   Procedure: COLONOSCOPY WITH PROPOFOL;  Surgeon: Rogene [place], MD;  Location: [facility];  Service: Endoscopy;  Laterality: N/A;  10:30  . excison of rt breast cyst    . LAPAROSCOPIC SALPINGO OOPHERECTOMY Right 04/22/2012   Procedure: LAPAROSCOPIC SALPINGO OOPHORECTOMY;  Surgeon: Jonnie Kind, MD;  Location: [facility] ORS;  Service: Gynecology;  Laterality: Right;  end 11:17  . MASS EXCISION N/A 04/22/2012   Procedure: EXCISION SKIN TAGS NECK AND HEAD;  Surgeon: Jonnie Kind, MD;  Location: [facility] ORS;  Service: Gynecology;  Laterality: N/A;  start 11:19  . PARTIAL HYSTERECTOMY    . POLYPECTOMY  08/24/2016   Procedure: POLYPECTOMY;  Surgeon: Rogene [place], MD;  Location[facility];  Service: Endoscopy;;  colon  . rt,. neck biopsy      Family History  Problem Relation Age of Onset  . Hypertension Brother   . Gout Brother   . Prostate cancer Brother   . Hypertension Brother   . Prostate cancer Brother   . Gout Brother   . Hypertension Sister   . Gout Sister   . Cancer Sister 16       pancreatic   . Leukemia Sister 21  . Gout Sister   . Prostate cancer Brother   . Diabetes Neg Hx     Social History Social History   Tobacco Use  . Smoking status: Former Smoker    Packs/day: 0.25    Years: 12.00    Pack years: 3.00    Last attempt to quit: 07/16/1978    Years since quitting: 39.7  . Smokeless  tobacco: Never Used  Substance Use Topics  . Alcohol use: No  . Drug use: No    Allergies  Allergen Reactions  . Benazepril Swelling  . Metronidazole Hives  . Mobic [Meloxicam] Hives  . Penicillins Hives and Swelling    Has patient had a PCN reaction causing immediate rash, facial/tongue/throat swelling, SOB or lightheadedness with hypotension: No Has patient had a PCN reaction causing severe rash involving mucus membranes or skin necrosis: Yes Has patient had a PCN reaction that required hospitalization: No Has patient had a PCN reaction occurring within the last 10 years: No If all of the above answers are "NO", then may proceed with Cephalosporin use.   . Sulfonamide Derivatives Hives    Current Outpatient Medications  Medication Sig Dispense Refill  . allopurinol (ZYLOPRIM) 300 MG tablet  Take 1 tablet (300 mg total) by mouth daily. 30 tablet 5  . aspirin 325 MG tablet Take 1 tablet (325 mg total) by mouth daily.    . calcitRIOL (ROCALTROL) 0.25 MCG capsule Take 0.25 mcg by mouth 3 (three) times a week. Mondays, Wednesdays,Fridays    . cyclobenzaprine (FLEXERIL) 10 MG tablet Take 10 mg by mouth 3 (three) times daily as needed for muscle spasms.     Marland Kitchen DILT-XR 180 MG 24 hr capsule TAKE 1 CAPSULE BY MOUTH ONCE DAILY 90 capsule 1  . docusate sodium (COLACE) 100 MG capsule Take 100 mg by mouth 2 (two) times daily. Constipation    . DULoxetine (CYMBALTA) 30 MG capsule Take 30 mg by mouth daily.    Marland Kitchen ezetimibe (ZETIA) 10 MG tablet Take 1 tablet (10 mg total) by mouth daily. 90 tablet 3  . gabapentin (NEURONTIN) 300 MG capsule Take 300 mg by mouth 3 (three) times daily.     Marland Kitchen HYDROcodone-acetaminophen (NORCO/VICODIN) 5-325 MG tablet One tablet every six hours for pain.  Limit 7 days. 28 tablet 0  . insulin NPH-regular Human (NOVOLIN 70/30 RELION) (70-30) 100 UNIT/ML injection Inject 190 Units into the skin daily with breakfast. 70 mL 11  . insulin regular (NOVOLIN R RELION) 100 units/mL injection Inject 0.8 mLs (80 Units total) into the skin daily with supper. 30 mL 11  . metolazone (ZAROXOLYN) 5 MG tablet TAKE 1 TABLET BY MOUTH ONCE DAILY 90 tablet 1  . metoprolol tartrate (LOPRESSOR) 100 MG tablet TAKE 1 TABLET BY MOUTH TWICE DAILY 180 tablet 2  . midodrine (PROAMATINE) 5 MG tablet Take 5 mg by mouth 2 (two) times daily.     . ONE TOUCH ULTRA TEST test strip USE 1 STRIP TO CHECK GLUCOSE TWICE DAILY 100 each 4  . ONETOUCH DELICA LANCETS 76H MISC 1 Device by Does not apply route 2 (two) times daily. 60 each 11  . potassium chloride (K-DUR) 10 MEQ tablet Take 3 tablets (30 mEq total) by mouth 3 (three) times daily. 270 tablet 1  . pravastatin (PRAVACHOL) 20 MG tablet TAKE 1 TABLET BY MOUTH ONCE DAILY BEFORE BREAKFAST 90 tablet 1  . RELION INSULIN SYR 1CC/30G 30G X 5/16" 1 ML MISC USE ONE  SYRINGE TO INJECT INSULIN SUBCUTANEOUSLY THREE TIMES DAILY AS DIRECTED 150 each 0  . RELION INSULIN SYRINGE 31G X 15/64" 1 ML MISC USE 1 SYRINGE THREE TIMES DAILY AS DIRECTED 150 each 0  . spironolactone (ALDACTONE) 25 MG tablet TAKE 1 TABLET BY MOUTH ONCE DAILY 90 tablet 1  . tiZANidine (ZANAFLEX) 4 MG tablet One by mouth every night before bed as needed for spasm 30 tablet 3  .  torsemide (DEMADEX) 20 MG tablet Take 80 mg by mouth daily.      No current facility-administered medications for this visit.      Physical Exam  Blood pressure (!) 154/68, pulse 72, height 5\' 3"  (1.6 m), weight 206 lb (93.4 kg).  Constitutional: overall normal hygiene, normal nutrition, well developed, normal grooming, normal body habitus. Assistive device:none  Musculoskeletal: gait and station Limp none, muscle tone and strength are normal, no tremors or atrophy is present.  .  Neurological: coordination overall normal.  Deep tendon reflex/nerve stretch intact. She has decreased sensation of the feet.  Cranial nerves II-XII intact.   Skin:   Normal overall no scars, lesions, ulcers or rashes. No psoriasis.  Psychiatric: Alert and oriented x 3.  Recent memory intact, remote memory unclear.  Normal mood and affect. Well groomed.  Good eye contact.  Cardiovascular: overall no swelling, no varicosities, no edema bilaterally, normal temperatures of the legs and arms, no clubbing, cyanosis and good capillary refill.  Lymphatic: palpation is normal.  Spine/Pelvis examination:  Inspection:  Overall, sacoiliac joint benign and hips nontender; without crepitus or defects.   Thoracic spine inspection: Alignment normal without kyphosis present   Lumbar spine inspection:  Alignment  with normal lumbar lordosis, without scoliosis apparent.   Thoracic spine palpation:  without tenderness of spinal processes   Lumbar spine palpation: without tenderness of lumbar area; without tightness of lumbar muscles    Range  of Motion:   Lumbar flexion, forward flexion is normal without pain or tenderness    Lumbar extension is full without pain or tenderness   Left lateral bend is normal without pain or tenderness   Right lateral bend is normal without pain or tenderness   Straight leg raising is normal  Strength & tone: normal   Stability overall normal stability  All other systems reviewed and are negative   The patient has been educated about the nature of the problem(s) and counseled on treatment options.  The patient appeared to understand what I have discussed and is in agreement with it.  Encounter Diagnoses  Name Primary?  . Chronic left-sided low back pain with left-sided sciatica Yes  . Hereditary and idiopathic peripheral neuropathy     PLAN Call if any problems.  Precautions discussed.  Continue current medications.   Return to clinic 1 month   Continue PT.  I have reviewed the [place] web site prior to prescribing narcotic medicine for this patient.   Electronically Signed Sanjuana Kava, MD 2/19/20203:24 PM

## 2018-03-20 ENCOUNTER — Encounter (HOSPITAL_COMMUNITY): Payer: Self-pay | Admitting: Physical Therapy

## 2018-03-20 ENCOUNTER — Ambulatory Visit (HOSPITAL_COMMUNITY): Payer: PPO | Admitting: Physical Therapy

## 2018-03-20 DIAGNOSIS — M545 Low back pain: Secondary | ICD-10-CM | POA: Diagnosis not present

## 2018-03-20 DIAGNOSIS — G8929 Other chronic pain: Secondary | ICD-10-CM

## 2018-03-20 DIAGNOSIS — R29898 Other symptoms and signs involving the musculoskeletal system: Secondary | ICD-10-CM

## 2018-03-20 NOTE — Therapy (Signed)
[place], [place], [address] Phone: [phone]   Fax:  [phone]  Physical Therapy Treatment  Patient Details  Name: Krista Strickland MRN: [id number] Date of Birth: 26-Aug-1950 Referring Provider (PT): Sanjuana Kava, MD   Encounter Date: 03/20/2018  PT End of Session - 03/20/18 0949    Visit Number  6    Number of Visits  9    Date for PT Re-Evaluation  04/02/18    Authorization Type  [employer] (No visit limit)    Authorization Time Period  03/05/18 - 04/02/18    Authorization - Visit Number  6    Authorization - Number of Visits  10    PT Start Time  4081    PT Stop Time  1025    PT Time Calculation (min)  38 min    Activity Tolerance  Patient tolerated treatment well;No increased pain    Behavior During Therapy  WFL for tasks assessed/performed       Past Medical History:  Diagnosis Date  . Arthritis   . Cancer ([place])    uterine  . Chronic kidney disease   . Coronary artery disease   . Diabetes mellitus, type 2 ([place])   . GERD (gastroesophageal reflux disease)    no medication in 2017  . Gout   . History of MRSA infection 03/2009   pt denies this  . Hypercalcemia 2017   managed by nephrology  . Hyperlipidemia   . Hypertension   . Obesity   . Psoriasis   . Stroke [facility]) 2013   "light"    Past Surgical History:  Procedure Laterality Date  . ABDOMINAL HYSTERECTOMY    . APPENDECTOMY  2012  . COLONOSCOPY WITH PROPOFOL N/A 08/24/2016   Procedure: COLONOSCOPY WITH PROPOFOL;  Surgeon: Rogene [place], MD;  [facility];  Service: Endoscopy;  Laterality: N/A;  10:30  . excison of rt breast cyst    . LAPAROSCOPIC SALPINGO OOPHERECTOMY Right 04/22/2012   Procedure: LAPAROSCOPIC SALPINGO OOPHORECTOMY;  Surgeon: Jonnie Kind, MD;  Location: [facility];  Service: Gynecology;  Laterality: Right;  end 11:17  . MASS EXCISION N/A 04/22/2012   Procedure: EXCISION SKIN TAGS NECK AND HEAD;   Surgeon: Jonnie Kind, MD;  Location: [facility];  Service: Gynecology;  Laterality: N/A;  start 11:19  . PARTIAL HYSTERECTOMY    . POLYPECTOMY  08/24/2016   Procedure: POLYPECTOMY;  Surgeon: Rogene [place], MD;  Location: [facility];  Service: Endoscopy;;  colon  . rt,. neck biopsy      There were no vitals filed for this visit.  Subjective Assessment - 03/20/18 0948    Subjective  She stated that her back is feeling better today and she rated it as a 3/10. She reported the physician said to continue with therapy.     Currently in Pain?  Yes    Pain Score  3     Pain Location  Back    Pain Orientation  Right;Lower    Pain Descriptors / Indicators  Aching    Pain Type  Chronic pain                       [facility] Adult PT Treatment/Exercise - 03/20/18 0001      Lumbar Exercises: Supine   Other Supine Lumbar Exercises  Decompression exercises Supine relax on back focusing on breathing and posture 3 minutes; Head press 10x; shoulder  press 10x; leg lengtheners 10x; leg presses x 10. Decompression level 2 exercises: the overhead, the side pull, the sash, and arm rotation x 10 each with RTB.              PT Education - 03/20/18 1000    Education Details  Explained purpose and technique of interventions throughout session.     Person(s) Educated  Patient    Methods  Explanation    Comprehension  Verbalized understanding       PT Short Term Goals - 03/06/18 0940      PT SHORT TERM GOAL #1   Title  Patient will report understanding and regular compliance with HEP in order to improve, strength, balance, functional mobility, and decrease back pain.     Time  2    Period  Weeks    Status  On-going      PT SHORT TERM GOAL #2   Title  Patient will demonstrate ability to maintain single limb stance on each lower extremity for at least 5 seconds on each lower extremity indicating improved stability and balance to negotiate stairs and curbs.     Time  2    Period   Weeks    Status  On-going        PT Long Term Goals - 03/06/18 0940      PT LONG TERM GOAL #1   Title  Patient will demonstrate improvement of at least 1/2 MMT strength grade in all deficient muscle groups in order to improve mechanics with transfers and ambulation.     Time  4    Period  Weeks    Status  On-going      PT LONG TERM GOAL #2   Title  Patient will report ability to stand for at least 20 minutes in order to prepare a meal at home without requiring to sit.     Time  4    Period  Weeks    Status  On-going      PT LONG TERM GOAL #3   Title  Patient will report that her pain has not exceeded a 3/10 over the course of a 1 week period indicating improved tolerance to daily activities.     Time  4    Period  Weeks    Status  On-going            Plan - 03/20/18 2831    Clinical Impression Statement  Patient reported decreased pain this session, so continued with progression of decompression exercises. Progressed to level 2 decompression exercises and continued with level 1 as well. Patient continued to require verbal and tactile cues to perform exercises correctly. Plan to add decompression level 1 exercises to patient's home exercise program once patient does not require cueing to perform.     Rehab Potential  Fair    Clinical Impairments Affecting Rehab Potential  Positive: Motivated; Negative: Comorbidites and chronicity of issue    PT Frequency  2x / week    PT Duration  4 weeks    PT Treatment/Interventions  ADLs/Self Care Home Management;Moist Heat;DME Instruction;Gait training;Stair training;Functional mobility training;Therapeutic activities;Therapeutic exercise;Balance training;Neuromuscular re-education;Patient/family education;Orthotic Fit/Training;Manual techniques;Passive range of motion;Dry needling;Energy conservation;Taping    PT Next Visit Plan   Add decompression level 1 exercises to patient's HEP as able. Progress to static balance actvities as  patient's pain level decreases, progress core strengthening in standing as able    PT Home Exercise Plan  2/6: bridges, single knee to  chest; 03/13/18: Hamstring stretch supine with towel 3x30'' 1-2x/day    Consulted and Agree with Plan of Care  Patient       Patient will benefit from skilled therapeutic intervention in order to improve the following deficits and impairments:  Abnormal gait, Decreased balance, Decreased endurance, Decreased mobility, Difficulty walking, Increased muscle spasms, Decreased range of motion, Improper body mechanics, Decreased activity tolerance, Decreased strength, Increased fascial restricitons, Postural dysfunction, Pain  Visit Diagnosis: Chronic bilateral low back pain, unspecified whether sciatica present  Other symptoms and signs involving the musculoskeletal system     Problem List Patient Active Problem List   Diagnosis Date Noted  . Type 2 diabetes mellitus with diabetic neuropathy, unspecified ([place]) 09/08/2017  . Special screening for malignant neoplasms, colon 07/06/2016  . Diabetic neuropathy ([place]) 11/09/2015  . Annual physical exam 12/16/2013  . Multinodular goiter (nontoxic) 09/20/2013  . Hypertriglyceridemia 08/05/2013  . Back pain 04/16/2013  . Type 2 diabetes mellitus with hyperglycemia ([place]) 12/07/2012  . 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  . CVA (cerebral vascular accident) ([place]) 07/23/2011  . CKD (chronic kidney disease) stage 3, GFR 30-59 ml/min (HCC) 11/07/2009  . SLEEP APNEA 09/06/2009  . SKIN TAG 07/14/2009  . Hyperuricemia 11/04/2008  . Hyperlipidemia LDL goal <100 07/02/2007  . Morbid obesity ([place] [address]) 07/02/2007  . Essential hypertension 07/02/2007   Clarene Critchley PT, DPT 10:02 AM, 03/20/18 [place], [place], [address] Phone: [phone]   Fax:  [phone]  Name: KEYASHA MIAH MRN: [id number] Date of Birth: 1950-03-17

## 2018-03-25 ENCOUNTER — Ambulatory Visit (HOSPITAL_COMMUNITY): Payer: PPO | Admitting: Physical Therapy

## 2018-03-25 DIAGNOSIS — M545 Low back pain, unspecified: Secondary | ICD-10-CM

## 2018-03-25 DIAGNOSIS — R29898 Other symptoms and signs involving the musculoskeletal system: Secondary | ICD-10-CM

## 2018-03-25 DIAGNOSIS — G8929 Other chronic pain: Secondary | ICD-10-CM

## 2018-03-25 NOTE — Therapy (Signed)
[place], [place], [address] Phone: [phone]   Fax:  [phone]  Physical Therapy Treatment  Patient Details  Name: Krista Strickland MRN: [id number] Date of Birth: Aug 25, 1950 Referring Provider (PT): Sanjuana Kava, MD   Encounter Date: 03/25/2018  PT End of Session - 03/25/18 1148    Visit Number  7    Number of Visits  10    Date for PT Re-Evaluation  04/02/18    Authorization Type  [employer] (No visit limit)    Authorization Time Period  03/05/18 - 04/02/18    Authorization - Visit Number  7    Authorization - Number of Visits  10    PT Start Time  0950    PT Stop Time  1036    PT Time Calculation (min)  46 min    Activity Tolerance  Patient tolerated treatment well;No increased pain    Behavior During Therapy  WFL for tasks assessed/performed       Past Medical History:  Diagnosis Date  . Arthritis   . Cancer ([place])    uterine  . Chronic kidney disease   . Coronary artery disease   . Diabetes mellitus, type 2 ([place])   . GERD (gastroesophageal reflux disease)    no medication in 2017  . Gout   . History of MRSA infection 03/2009   pt denies this  . Hypercalcemia 2017   managed by nephrology  . Hyperlipidemia   . Hypertension   . Obesity   . Psoriasis   . Stroke [facility]) 2013   "light"    Past Surgical History:  Procedure Laterality Date  . ABDOMINAL HYSTERECTOMY    . APPENDECTOMY  2012  . COLONOSCOPY WITH PROPOFOL N/A 08/24/2016   Procedure: COLONOSCOPY WITH PROPOFOL;  Surgeon: Rogene Houston, MD;  [facility];  Service: Endoscopy;  Laterality: N/A;  10:30  . excison of rt breast cyst    . LAPAROSCOPIC SALPINGO OOPHERECTOMY Right 04/22/2012   Procedure: LAPAROSCOPIC SALPINGO OOPHORECTOMY;  Surgeon: Jonnie Kind, MD;  Location: [facility] ORS;  Service: Gynecology;  Laterality: Right;  end 11:17  . MASS EXCISION N/A 04/22/2012   Procedure: EXCISION SKIN TAGS NECK AND HEAD;   Surgeon: Jonnie Kind, MD;  Location: [facility] ORS;  Service: Gynecology;  Laterality: N/A;  start 11:19  . PARTIAL HYSTERECTOMY    . POLYPECTOMY  08/24/2016   Procedure: POLYPECTOMY;  Surgeon: Rogene Houston, MD;  [facility];  Service: Endoscopy;;  colon  . rt,. neck biopsy      There were no vitals filed for this visit.  Subjective Assessment - 03/25/18 0954    Subjective  pt states her pain is 5/10 today.  Reports she doesn't feel the exercises are helping yet.  Reports regular compliance with HEP.      Currently in Pain?  Yes    Pain Score  5     Pain Location  Back    Pain Orientation  Lower;Right    Pain Descriptors / Indicators  Aching                       [facility] Adult PT Treatment/Exercise - 03/25/18 0001      Lumbar Exercises: Stretches   Active Hamstring Stretch  Right;Left;2 reps;30 seconds    Active Hamstring Stretch Limitations  with towel    Single Knee to Chest Stretch  Right;Left;20 seconds;2 reps  Lumbar Exercises: Supine   Bridge  10 reps    Other Supine Lumbar Exercises  Decompression exercises Supine relax on back focusing on breathing and posture 3 minutes; Head press 10x; shoulder press 10x; leg lengtheners 10x; leg presses x 10. Decompression level 2 exercises: the overhead, the side pull, the sash, and arm rotation x 10 each.              PT Education - 03/25/18 0955    Education Details  noted edema in feet and ankles bilaterally.  Educated on compression stockings, which she has but doesn't wear.  Encouraged patient to wear these.     Person(s) Educated  Patient    Methods  Explanation    Comprehension  Verbalized understanding       PT Short Term Goals - 03/06/18 0940      PT SHORT TERM GOAL #1   Title  Patient will report understanding and regular compliance with HEP in order to improve, strength, balance, functional mobility, and decrease back pain.     Time  2    Period  Weeks    Status  On-going      PT  SHORT TERM GOAL #2   Title  Patient will demonstrate ability to maintain single limb stance on each lower extremity for at least 5 seconds on each lower extremity indicating improved stability and balance to negotiate stairs and curbs.     Time  2    Period  Weeks    Status  On-going        PT Long Term Goals - 03/06/18 0940      PT LONG TERM GOAL #1   Title  Patient will demonstrate improvement of at least 1/2 MMT strength grade in all deficient muscle groups in order to improve mechanics with transfers and ambulation.     Time  4    Period  Weeks    Status  On-going      PT LONG TERM GOAL #2   Title  Patient will report ability to stand for at least 20 minutes in order to prepare a meal at home without requiring to sit.     Time  4    Period  Weeks    Status  On-going      PT LONG TERM GOAL #3   Title  Patient will report that her pain has not exceeded a 3/10 over the course of a 1 week period indicating improved tolerance to daily activities.     Time  4    Period  Weeks    Status  On-going            Plan - 03/25/18 1150    Clinical Impression Statement  contiued with established decompression/stretching POC.  continues to require heavy tactile and verbal cues for form with all therex and increased time to complete.  Pt without any complaints or discomfort voiced with therex today.  Reported "good workout" and ability to move better at end of session.      Rehab Potential  Fair    Clinical Impairments Affecting Rehab Potential  Positive: Motivated; Negative: Comorbidites and chronicity of issue    PT Frequency  2x / week    PT Duration  4 weeks    PT Treatment/Interventions  ADLs/Self Care Home Management;Moist Heat;DME Instruction;Gait training;Stair training;Functional mobility training;Therapeutic activities;Therapeutic exercise;Balance training;Neuromuscular re-education;Patient/family education;Orthotic Fit/Training;Manual techniques;Passive range of motion;Dry  needling;Energy conservation;Taping    PT Next Visit Plan   Add  decompression level 1 exercises to patient's HEP when able to recall and complete in correct form independently. Progress to static balance actvities as patient's pain level decreases, progress core strengthening in standing as able    PT Home Exercise Plan  2/6: bridges, single knee to chest; 03/13/18: Hamstring stretch supine with towel 3x30'' 1-2x/day    Consulted and Agree with Plan of Care  Patient       Patient will benefit from skilled therapeutic intervention in order to improve the following deficits and impairments:  Abnormal gait, Decreased balance, Decreased endurance, Decreased mobility, Difficulty walking, Increased muscle spasms, Decreased range of motion, Improper body mechanics, Decreased activity tolerance, Decreased strength, Increased fascial restricitons, Postural dysfunction, Pain  Visit Diagnosis: Chronic bilateral low back pain, unspecified whether sciatica present  Other symptoms and signs involving the musculoskeletal system     Problem List Patient Active Problem List   Diagnosis Date Noted  . Type 2 diabetes mellitus with diabetic neuropathy, unspecified ([place]) 09/08/2017  . Special screening for malignant neoplasms, colon 07/06/2016  . Diabetic neuropathy ([place]) 11/09/2015  . Annual physical exam 12/16/2013  . Multinodular goiter (nontoxic) 09/20/2013  . Hypertriglyceridemia 08/05/2013  . Back pain 04/16/2013  . Type 2 diabetes mellitus with hyperglycemia ([place]) 12/07/2012  . 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  . CVA (cerebral vascular accident) ([place]) 07/23/2011  . CKD (chronic kidney disease) stage 3, GFR 30-59 ml/min (HCC) 11/07/2009  . SLEEP APNEA 09/06/2009  . SKIN TAG 07/14/2009  . Hyperuricemia 11/04/2008  . Hyperlipidemia LDL goal <100 07/02/2007  . Morbid obesity ([place]) 07/02/2007  . Essential hypertension 07/02/2007   Teena Irani, PTA/CLT [phone]  Teena Irani 03/25/2018, 11:52 AM  [place] [address] [place], [place], [address] Phone: [phone]   Fax:  [phone]  Name: Krista Strickland MRN: [id number] Date of Birth: 08/30/1950

## 2018-03-27 ENCOUNTER — Ambulatory Visit (HOSPITAL_COMMUNITY): Payer: PPO | Admitting: Physical Therapy

## 2018-03-27 ENCOUNTER — Encounter (HOSPITAL_COMMUNITY): Payer: Self-pay | Admitting: Physical Therapy

## 2018-03-27 DIAGNOSIS — R29898 Other symptoms and signs involving the musculoskeletal system: Secondary | ICD-10-CM

## 2018-03-27 DIAGNOSIS — M545 Low back pain: Principal | ICD-10-CM

## 2018-03-27 DIAGNOSIS — G8929 Other chronic pain: Secondary | ICD-10-CM

## 2018-03-27 NOTE — Therapy (Signed)
[place], [place], [address] Phone: [phone]   Fax:  [phone]  Physical Therapy Treatment  Patient Details  Name: Krista Strickland MRN: [id number] Date of Birth: May 21, 1950 Referring Provider (PT): Sanjuana Kava, MD   Encounter Date: 03/27/2018  PT End of Session - 03/27/18 1402    Visit Number  8    Number of Visits  10    Date for PT Re-Evaluation  04/02/18    Authorization Type  [employer] (No visit limit)    Authorization Time Period  03/05/18 - 04/02/18    Authorization - Visit Number  8    Authorization - Number of Visits  10    PT Start Time  3846    PT Stop Time  1432    PT Time Calculation (min)  41 min    Activity Tolerance  Patient tolerated treatment well;No increased pain    Behavior During Therapy  WFL for tasks assessed/performed       Past Medical History:  Diagnosis Date  . Arthritis   . Cancer ([place])    uterine  . Chronic kidney disease   . Coronary artery disease   . Diabetes mellitus, type 2 ([place])   . GERD (gastroesophageal reflux disease)    no medication in 2017  . Gout   . History of MRSA infection 03/2009   pt denies this  . Hypercalcemia 2017   managed by nephrology  . Hyperlipidemia   . Hypertension   . Obesity   . Psoriasis   . Stroke [facility]) 2013   "light"    Past Surgical History:  Procedure Laterality Date  . ABDOMINAL HYSTERECTOMY    . APPENDECTOMY  2012  . COLONOSCOPY WITH PROPOFOL N/A 08/24/2016   Procedure: COLONOSCOPY WITH PROPOFOL;  Surgeon: Rogene Houston, MD;  [facility];  Service: Endoscopy;  Laterality: N/A;  10:30  . excison of rt breast cyst    . LAPAROSCOPIC SALPINGO OOPHERECTOMY Right 04/22/2012   Procedure: LAPAROSCOPIC SALPINGO OOPHORECTOMY;  Surgeon: Jonnie Kind, MD;  Location: [facility];  Service: Gynecology;  Laterality: Right;  end 11:17  . MASS EXCISION N/A 04/22/2012   Procedure: EXCISION SKIN TAGS NECK AND HEAD;   Surgeon: Jonnie Kind, MD;  Location: [facility];  Service: Gynecology;  Laterality: N/A;  start 11:19  . PARTIAL HYSTERECTOMY    . POLYPECTOMY  08/24/2016   Procedure: POLYPECTOMY;  Surgeon: Rogene Houston, MD;  [facility];  Service: Endoscopy;;  colon  . rt,. neck biopsy      There were no vitals filed for this visit.  Subjective Assessment - 03/27/18 1357    Subjective  Patient reported that her back pain is 5/10 today.     Currently in Pain?  Yes    Pain Score  5     Pain Location  Back    Pain Orientation  Right;Lower    Pain Descriptors / Indicators  Aching    Pain Type  Chronic pain                       [facility] Adult PT Treatment/Exercise - 03/27/18 0001      Lumbar Exercises: Standing   Functional Squats  10 reps    Functional Squats Limitations  With bilateral UE support    Other Standing Lumbar Exercises  Tandem stance 3x30 seconds each LE forward intermittent UE support    Other  Standing Lumbar Exercises  Hip abduction and extension x 10 each LE bilateral UE support      Lumbar Exercises: Supine   Other Supine Lumbar Exercises  Decompression level 2 exercises: the overhead, the side pull, the sash, and arm rotation x 10 each.  With RTB            PT Education - 03/27/18 1359    Education Details  Educated on continuing HEP and adding Decompression exercises level 1 to HEP.     Person(s) Educated  Patient    Methods  Explanation    Comprehension  Verbalized understanding       PT Short Term Goals - 03/06/18 0940      PT SHORT TERM GOAL #1   Title  Patient will report understanding and regular compliance with HEP in order to improve, strength, balance, functional mobility, and decrease back pain.     Time  2    Period  Weeks    Status  On-going      PT SHORT TERM GOAL #2   Title  Patient will demonstrate ability to maintain single limb stance on each lower extremity for at least 5 seconds on each lower extremity indicating  improved stability and balance to negotiate stairs and curbs.     Time  2    Period  Weeks    Status  On-going        PT Long Term Goals - 03/06/18 0940      PT LONG TERM GOAL #1   Title  Patient will demonstrate improvement of at least 1/2 MMT strength grade in all deficient muscle groups in order to improve mechanics with transfers and ambulation.     Time  4    Period  Weeks    Status  On-going      PT LONG TERM GOAL #2   Title  Patient will report ability to stand for at least 20 minutes in order to prepare a meal at home without requiring to sit.     Time  4    Period  Weeks    Status  On-going      PT LONG TERM GOAL #3   Title  Patient will report that her pain has not exceeded a 3/10 over the course of a 1 week period indicating improved tolerance to daily activities.     Time  4    Period  Weeks    Status  On-going            Plan - 03/27/18 1436    Clinical Impression Statement  Continued with established plan of care this session. Added Decompression exercises level 1 to patient's HEP. Patient required frequent tactile and verbal cues in order to perform Decompression Level 2 exercises with proper form. This session progressed patient to standing strengthening activities and balance activities. Patient required frequent verbal and tactile cues to improve patient's form. Plan to continue progressing strengthening and balance in standing.     Rehab Potential  Fair    Clinical Impairments Affecting Rehab Potential  Positive: Motivated; Negative: Comorbidites and chronicity of issue    PT Frequency  2x / week    PT Duration  4 weeks    PT Treatment/Interventions  ADLs/Self Care Home Management;Moist Heat;DME Instruction;Gait training;Stair training;Functional mobility training;Therapeutic activities;Therapeutic exercise;Balance training;Neuromuscular re-education;Patient/family education;Orthotic Fit/Training;Manual techniques;Passive range of motion;Dry needling;Energy  conservation;Taping    PT Next Visit Plan  Continue progressing static balance actvities, progress core strengthening and standing  functional strengthening as able    PT Home Exercise Plan  2/6: bridges, single knee to chest; 03/13/18: Hamstring stretch supine with towel 3x30'' 1-2x/day    Consulted and Agree with Plan of Care  Patient       Patient will benefit from skilled therapeutic intervention in order to improve the following deficits and impairments:  Abnormal gait, Decreased balance, Decreased endurance, Decreased mobility, Difficulty walking, Increased muscle spasms, Decreased range of motion, Improper body mechanics, Decreased activity tolerance, Decreased strength, Increased fascial restricitons, Postural dysfunction, Pain  Visit Diagnosis: Chronic bilateral low back pain, unspecified whether sciatica present  Other symptoms and signs involving the musculoskeletal system     Problem List Patient Active Problem List   Diagnosis Date Noted  . Type 2 diabetes mellitus with diabetic neuropathy, unspecified ([place]) 09/08/2017  . Special screening for malignant neoplasms, colon 07/06/2016  . Diabetic neuropathy ([place]) 11/09/2015  . Annual physical exam 12/16/2013  . Multinodular goiter (nontoxic) 09/20/2013  . Hypertriglyceridemia 08/05/2013  . Back pain 04/16/2013  . Type 2 diabetes mellitus with hyperglycemia ([place]) 12/07/2012  . 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  . CVA (cerebral vascular accident) ([place]) 07/23/2011  . CKD (chronic kidney disease) stage 3, GFR 30-59 ml/min (HCC) 11/07/2009  . SLEEP APNEA 09/06/2009  . SKIN TAG 07/14/2009  . Hyperuricemia 11/04/2008  . Hyperlipidemia LDL goal <100 07/02/2007  . Morbid obesity ([place]) 07/02/2007  . Essential hypertension 07/02/2007   Clarene Critchley PT, DPT 2:38 PM, 03/27/18 [place], [place], [address] Phone: [phone]   Fax:   [phone]  Name: Krista Strickland MRN: [id number] Date of Birth: 12/12/1950

## 2018-03-30 ENCOUNTER — Other Ambulatory Visit: Payer: Self-pay | Admitting: Family Medicine

## 2018-04-01 ENCOUNTER — Encounter (HOSPITAL_COMMUNITY): Payer: Self-pay | Admitting: Physical Therapy

## 2018-04-01 ENCOUNTER — Ambulatory Visit (HOSPITAL_COMMUNITY): Payer: PPO | Attending: Orthopaedic Surgery | Admitting: Physical Therapy

## 2018-04-01 DIAGNOSIS — R29898 Other symptoms and signs involving the musculoskeletal system: Secondary | ICD-10-CM | POA: Diagnosis not present

## 2018-04-01 DIAGNOSIS — G8929 Other chronic pain: Secondary | ICD-10-CM | POA: Diagnosis not present

## 2018-04-01 DIAGNOSIS — M545 Low back pain: Secondary | ICD-10-CM | POA: Insufficient documentation

## 2018-04-01 NOTE — Therapy (Signed)
[place], [place], [address] Phone: [phone]   Fax:  [phone]  Physical Therapy Treatment  Patient Details  Name: Krista Strickland MRN: [id number] Date of Birth: 03-14-1950 Referring Provider (PT): Sanjuana Kava, MD   Encounter Date: 04/01/2018  PT End of Session - 04/01/18 0958    Visit Number  9    Number of Visits  10    Date for PT Re-Evaluation  04/02/18    Authorization Type  [employer] (No visit limit)    Authorization Time Period  03/05/18 - 04/02/18    Authorization - Visit Number  9    Authorization - Number of Visits  10    PT Start Time  7425    PT Stop Time  1031    PT Time Calculation (min)  39 min    Activity Tolerance  Patient tolerated treatment well;No increased pain    Behavior During Therapy  WFL for tasks assessed/performed       Past Medical History:  Diagnosis Date  . Arthritis   . Cancer ([place])    uterine  . Chronic kidney disease   . Coronary artery disease   . Diabetes mellitus, type 2 ([place])   . GERD (gastroesophageal reflux disease)    no medication in 2017  . Gout   . History of MRSA infection 03/2009   pt denies this  . Hypercalcemia 2017   managed by nephrology  . Hyperlipidemia   . Hypertension   . Obesity   . Psoriasis   . Stroke [facility]) 2013   "light"    Past Surgical History:  Procedure Laterality Date  . ABDOMINAL HYSTERECTOMY    . APPENDECTOMY  2012  . COLONOSCOPY WITH PROPOFOL N/A 08/24/2016   Procedure: COLONOSCOPY WITH PROPOFOL;  Surgeon: Rogene Houston, MD;  [facility];  Service: Endoscopy;  Laterality: N/A;  10:30  . excison of rt breast cyst    . LAPAROSCOPIC SALPINGO OOPHERECTOMY Right 04/22/2012   Procedure: LAPAROSCOPIC SALPINGO OOPHORECTOMY;  Surgeon: Jonnie Kind, MD;  Location: [facility];  Service: Gynecology;  Laterality: Right;  end 11:17  . MASS EXCISION N/A 04/22/2012   Procedure: EXCISION SKIN TAGS NECK AND HEAD;   Surgeon: Jonnie Kind, MD;  Location: [facility];  Service: Gynecology;  Laterality: N/A;  start 11:19  . PARTIAL HYSTERECTOMY    . POLYPECTOMY  08/24/2016   Procedure: POLYPECTOMY;  Surgeon: Rogene Houston, MD;  [facility];  Service: Endoscopy;;  colon  . rt,. neck biopsy      There were no vitals filed for this visit.  Subjective Assessment - 04/01/18 0957    Subjective  patient reported that her back pain is a 7/10 today.     Currently in Pain?  Yes    Pain Score  7     Pain Location  Back    Pain Orientation  Right;Lower    Pain Descriptors / Indicators  Aching    Pain Type  Chronic pain    Pain Onset  More than a month ago    Pain Frequency  Intermittent                       [facility] Adult PT Treatment/Exercise - 04/01/18 0001      Lumbar Exercises: Stretches   Active Hamstring Stretch  Right;Left;30 seconds;3 reps    Active Hamstring Stretch Limitations  Standing with leg on  12'' step      Lumbar Exercises: Standing   Functional Squats  10 reps    Functional Squats Limitations  With bilateral UE support with verbal and tactile cues for form    Other Standing Lumbar Exercises  SLS with vectors forward side and back 5x 3'' holds each LE. Tandem stance 3x30 seconds each LE forward intermittent UE support    Other Standing Lumbar Exercises  Hip abduction and extension x 10 each LE bilateral UE support      Manual Therapy   Manual Therapy  Soft tissue mobilization    Manual therapy comments  All manual completed separately from other skilled interventions    Soft tissue mobilization  Patient prone with ankles rested on soft bolster STM to bilateral thoracic and lumbar paraspinals to decrease pain and muscular restrictions               PT Short Term Goals - 03/06/18 0940      PT SHORT TERM GOAL #1   Title  Patient will report understanding and regular compliance with HEP in order to improve, strength, balance, functional mobility, and  decrease back pain.     Time  2    Period  Weeks    Status  On-going      PT SHORT TERM GOAL #2   Title  Patient will demonstrate ability to maintain single limb stance on each lower extremity for at least 5 seconds on each lower extremity indicating improved stability and balance to negotiate stairs and curbs.     Time  2    Period  Weeks    Status  On-going        PT Long Term Goals - 03/06/18 0940      PT LONG TERM GOAL #1   Title  Patient will demonstrate improvement of at least 1/2 MMT strength grade in all deficient muscle groups in order to improve mechanics with transfers and ambulation.     Time  4    Period  Weeks    Status  On-going      PT LONG TERM GOAL #2   Title  Patient will report ability to stand for at least 20 minutes in order to prepare a meal at home without requiring to sit.     Time  4    Period  Weeks    Status  On-going      PT LONG TERM GOAL #3   Title  Patient will report that her pain has not exceeded a 3/10 over the course of a 1 week period indicating improved tolerance to daily activities.     Time  4    Period  Weeks    Status  On-going            Plan - 04/01/18 1010    Clinical Impression Statement  Continued with established plan of care this session. Added vector stance this session to improve patient's hip stability and balance. Modified exercises as needed for patient's pain level. Following manual therapy, patient reported a decrease in her symptoms to 5/10 pain level.  Plan to re-assess patient at next session to determine possible need for continued therapy.     Rehab Potential  Fair    Clinical Impairments Affecting Rehab Potential  Positive: Motivated; Negative: Comorbidites and chronicity of issue    PT Frequency  2x / week    PT Duration  4 weeks    PT Treatment/Interventions  ADLs/Self Care Home Management;Moist  Heat;DME Instruction;Gait training;Stair training;Functional mobility training;Therapeutic activities;Therapeutic  exercise;Balance training;Neuromuscular re-education;Patient/family education;Orthotic Fit/Training;Manual techniques;Passive range of motion;Dry needling;Energy conservation;Taping    PT Next Visit Plan  Re-assess next session    PT Home Exercise Plan  2/6: bridges, single knee to chest; 03/13/18: Hamstring stretch supine with towel 3x30'' 1-2x/day    Consulted and Agree with Plan of Care  Patient       Patient will benefit from skilled therapeutic intervention in order to improve the following deficits and impairments:  Abnormal gait, Decreased balance, Decreased endurance, Decreased mobility, Difficulty walking, Increased muscle spasms, Decreased range of motion, Improper body mechanics, Decreased activity tolerance, Decreased strength, Increased fascial restricitons, Postural dysfunction, Pain  Visit Diagnosis: Chronic bilateral low back pain, unspecified whether sciatica present  Other symptoms and signs involving the musculoskeletal system     Problem List Patient Active Problem List   Diagnosis Date Noted  . Type 2 diabetes mellitus with diabetic neuropathy, unspecified ([place]) 09/08/2017  . Special screening for malignant neoplasms, colon 07/06/2016  . Diabetic neuropathy ([place]) 11/09/2015  . Annual physical exam 12/16/2013  . Multinodular goiter (nontoxic) 09/20/2013  . Hypertriglyceridemia 08/05/2013  . Back pain 04/16/2013  . Type 2 diabetes mellitus with hyperglycemia ([place]) 12/07/2012  . 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  . CVA (cerebral vascular accident) ([place]) 07/23/2011  . CKD (chronic kidney disease) stage 3, GFR 30-59 ml/min (HCC) 11/07/2009  . SLEEP APNEA 09/06/2009  . SKIN TAG 07/14/2009  . Hyperuricemia 11/04/2008  . Hyperlipidemia LDL goal <100 07/02/2007  . Morbid obesity ([place]) 07/02/2007  . Essential hypertension 07/02/2007   Clarene Critchley PT, DPT 10:41 AM, 04/01/18 [place], [place], [address] Phone: [phone]   Fax:  [phone]  Name: Krista Strickland MRN: [id number] Date of Birth: 09-08-1950

## 2018-04-03 ENCOUNTER — Ambulatory Visit (HOSPITAL_COMMUNITY): Payer: PPO | Admitting: Physical Therapy

## 2018-04-03 ENCOUNTER — Encounter (HOSPITAL_COMMUNITY): Payer: Self-pay | Admitting: Physical Therapy

## 2018-04-03 DIAGNOSIS — R29898 Other symptoms and signs involving the musculoskeletal system: Secondary | ICD-10-CM

## 2018-04-03 DIAGNOSIS — M545 Low back pain: Secondary | ICD-10-CM | POA: Diagnosis not present

## 2018-04-03 DIAGNOSIS — G8929 Other chronic pain: Secondary | ICD-10-CM

## 2018-04-03 NOTE — Therapy (Signed)
[facility] [address] [place], [place], [address] Phone: [phone]   Fax:  [phone]  Physical Therapy Treatment / Progress Note  Patient Details  Name: Krista Strickland MRN: [id number] Date of Birth: July 26, 1950 Referring Provider (PT): Sanjuana Kava, MD   Encounter Date: 04/03/2018   Progress Note Reporting Period 03/05/18 to 04/03/18  See note below for Objective Data and Assessment of Progress/Goals.       PT End of Session - 04/03/18 1158    Visit Number  10    Number of Visits  15    Date for PT Re-Evaluation  04/02/18    Authorization Type  [employer] (No visit limit)    Authorization Time Period  03/05/18 - 04/02/18; 04/03/18- 04/24/18    Authorization - Visit Number  10    Authorization - Number of Visits  15    PT Start Time  0946    PT Stop Time  5462   Some time unbilled for re-assessment   PT Time Calculation (min)  44 min    Activity Tolerance  Patient tolerated treatment well;No increased pain    Behavior During Therapy  WFL for tasks assessed/performed       Past Medical History:  Diagnosis Date  . Arthritis   . Cancer ([place])    uterine  . Chronic kidney disease   . Coronary artery disease   . Diabetes mellitus, type 2 ([place])   . GERD (gastroesophageal reflux disease)    no medication in 2017  . Gout   . History of MRSA infection 03/2009   pt denies this  . Hypercalcemia 2017   managed by nephrology  . Hyperlipidemia   . Hypertension   . Obesity   . Psoriasis   . Stroke [facility]) 2013   "light"    Past Surgical History:  Procedure Laterality Date  . ABDOMINAL HYSTERECTOMY    . APPENDECTOMY  2012  . COLONOSCOPY WITH PROPOFOL N/A 08/24/2016   Procedure: COLONOSCOPY WITH PROPOFOL;  Surgeon: Rogene Houston, MD;  [facility];  Service: Endoscopy;  Laterality: N/A;  10:30  . excison of rt breast cyst    . LAPAROSCOPIC SALPINGO OOPHERECTOMY Right 04/22/2012   Procedure: LAPAROSCOPIC  SALPINGO OOPHORECTOMY;  Surgeon: Jonnie Kind, MD;  Location: [facility] ORS;  Service: Gynecology;  Laterality: Right;  end 11:17  . MASS EXCISION N/A 04/22/2012   Procedure: EXCISION SKIN TAGS NECK AND HEAD;  Surgeon: Jonnie Kind, MD;  Location: [facility] ORS;  Service: Gynecology;  Laterality: N/A;  start 11:19  . PARTIAL HYSTERECTOMY    . POLYPECTOMY  08/24/2016   Procedure: POLYPECTOMY;  Surgeon: Rogene Houston, MD;  Location: [facility];  Service: Endoscopy;;  colon  . rt,. neck biopsy      There were no vitals filed for this visit.  Subjective Assessment - 04/03/18 0951    Subjective  Patient reported that her pain level is about 5/10 today. She stated that she feels she has made progress in therapy, and feels like she could benefit from more.     How long can you sit comfortably?  Not limited    How long can you stand comfortably?  10 minutes    How long can you walk comfortably?  5 minutes    Diagnostic tests  X-ray of left hip 06/06/17: Negative; See imaging for more details    Patient Stated Goals  To have decreased pain    Currently  in Pain?  Yes    Pain Score  5     Pain Location  Back    Pain Orientation  Left;Lower    Pain Descriptors / Indicators  Aching    Pain Type  Chronic pain    Pain Onset  More than a month ago         [facility] PT Assessment - 04/03/18 0001      Assessment   Medical Diagnosis  Chronic Left-sided low back pain with left-sided sciatica    Referring Provider (PT)  Sanjuana Kava, [place] residence    Living Arrangements  Spouse/significant other    Type of [place] Access  Level entry    [place]  None      Prior Function   Level of Independence  Independent with basic ADLs;Independent with household mobility without device    Vocation  Retired      Charity fundraiser Status  Within Functional Limits for tasks assessed      Observation/Other  Assessments   Focus on Therapeutic Outcomes (FOTO)   64% limited   was 66% limited     Posture/Postural Control   Posture/Postural Control  Postural limitations    Postural Limitations  Rounded Shoulders;Forward head;Decreased lumbar lordosis      AROM   Lumbar Flexion  WNL, Gower's sign   was 50% limited   Lumbar Extension  75% limited    was 75% limited   Lumbar - Right Side Bend  25% limited   was 50% limited   Lumbar - Left Side Bend  25% limited   was 50% limited   Lumbar - Right Rotation  75% limited   was 75% limited   Lumbar - Left Rotation  75% limited   was 75% limited     Strength   Right Hip Flexion  4/5   was 3+   Right Hip Extension  3-/5   was 2+   Right Hip ABduction  4/5   was 4-   Left Hip Flexion  4/5   was 3+   Left Hip Extension  3-/5   was 2+   Left Hip ABduction  4-/5   was 3+   Right Knee Flexion  4+/5   was 3+   Right Knee Extension  4/5   was 3-   Left Knee Flexion  4+/5   was 3+   Left Knee Extension  4/5   was 3-   Right/Left Ankle  Right;Left    Right Ankle Dorsiflexion  5/5   was 4+   Left Ankle Dorsiflexion  5/5   was 4+     Palpation   Palpation comment  Patient reported tenderness to palpation of lumbar and thoracic paraspinals bilaterally      Static Standing Balance   Static Standing - Balance Support  No upper extremity supported    Static Standing Balance -  Activities   Single Leg Stance - Right Leg;Single Leg Stance - Left Leg    Static Standing - Comment/# of Minutes  SLS left: 6 seconds; SLS Right 2 seconds                   [facility] Adult PT Treatment/Exercise - 04/03/18 0001      Lumbar Exercises: Stretches   Double Knee to Chest Stretch  3 reps;20 seconds    Double Knee to Chest Stretch Limitations  with towel    Lower Trunk Rotation  10 seconds    Lower Trunk Rotation Limitations  10 repetitions               PT Short Term Goals - 04/03/18 1021      PT SHORT TERM GOAL #1   Title   Patient will report understanding and regular compliance with HEP in order to improve, strength, balance, functional mobility, and decrease back pain.     Time  2    Period  Weeks    Status  Achieved      PT SHORT TERM GOAL #2   Title  Patient will demonstrate ability to maintain single limb stance on each lower extremity for at least 5 seconds on each lower extremity indicating improved stability and balance to negotiate stairs and curbs.     Baseline  04/03/18: Patient achieved on the left LE, but not the right.     Time  2    Period  Weeks    Status  Partially Met        PT Long Term Goals - 04/03/18 1022      PT LONG TERM GOAL #1   Title  Patient will demonstrate improvement of at least 1/2 MMT strength grade in all deficient muscle groups in order to improve mechanics with transfers and ambulation.     Time  4    Period  Weeks    Status  Achieved      PT LONG TERM GOAL #2   Title  Patient will report ability to stand for at least 20 minutes in order to prepare a meal at home without requiring to sit.     Time  3    Period  Weeks    Status  On-going    Target Date  04/24/18      PT LONG TERM GOAL #3   Title  Patient will report that her pain has not exceeded a 3/10 over the course of a 1 week period indicating improved tolerance to daily activities.     Time  3    Period  Weeks    Status  On-going    Target Date  04/24/18      PT LONG TERM GOAL #4   Title  Patient will demonstrate improvement of at least 1 MMT strength grade from time of evaluation in all deficient muscle groups in order to improve mechanics with transfers and ambulation.     Time  3    Period  Weeks    Status  New    Target Date  04/24/18            Plan - 04/03/18 1155    Clinical Impression Statement  This session performed a re-assessment of patient's progress towards goals. Patient achieved 1 out of 2 short term goals and partially met 1 out of 2 short term goals. Patient achieved 1 out of 3  long term goals. Therapist added a new long term goal to continue progressing patient's strength. Remainder of session focused on exercises to improve patient's lumbar mobility. Patient has made progress in therapy and would continue to benefit from skilled physical therapy in order to continue progressing towards functional goals. Therapist recommends 3 more weeks of therapy to continue to address patient's low back pain and improve patient's strength, and overall functional mobility.     Rehab Potential  Fair  Clinical Impairments Affecting Rehab Potential  Positive: Motivated; Negative: Comorbidites and chronicity of issue    PT Frequency  2x / week    PT Duration  3 weeks    PT Treatment/Interventions  ADLs/Self Care Home Management;Moist Heat;DME Instruction;Gait training;Stair training;Functional mobility training;Therapeutic activities;Therapeutic exercise;Balance training;Neuromuscular re-education;Patient/family education;Orthotic Fit/Training;Manual techniques;Passive range of motion;Dry needling;Energy conservation;Taping    PT Next Visit Plan  Lumbar mobility exercises. Work on patient's posture, upright standing. Flexion based exercises to decrease patient's pain.     PT Home Exercise Plan  2/6: bridges, single knee to chest; 03/13/18: Hamstring stretch supine with towel 3x30'' 1-2x/day    Consulted and Agree with Plan of Care  Patient       Patient will benefit from skilled therapeutic intervention in order to improve the following deficits and impairments:  Abnormal gait, Decreased balance, Decreased endurance, Decreased mobility, Difficulty walking, Increased muscle spasms, Decreased range of motion, Improper body mechanics, Decreased activity tolerance, Decreased strength, Increased fascial restricitons, Postural dysfunction, Pain  Visit Diagnosis: Chronic bilateral low back pain, unspecified whether sciatica present  Other symptoms and signs involving the musculoskeletal  system     Problem List Patient Active Problem List   Diagnosis Date Noted  . Type 2 diabetes mellitus with diabetic neuropathy, unspecified ([place]) 09/08/2017  . Special screening for malignant neoplasms, colon 07/06/2016  . Diabetic neuropathy ([place]) 11/09/2015  . Annual physical exam 12/16/2013  . Multinodular goiter (nontoxic) 09/20/2013  . Hypertriglyceridemia 08/05/2013  . Back pain 04/16/2013  . Type 2 diabetes mellitus with hyperglycemia ([place]) 12/07/2012  . 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  . CVA (cerebral vascular accident) ([place]) 07/23/2011  . CKD (chronic kidney disease) stage 3, GFR 30-59 ml/min ([facility]) 11/07/2009  . SLEEP APNEA 09/06/2009  . SKIN TAG 07/14/2009  . Hyperuricemia 11/04/2008  . Hyperlipidemia LDL goal <100 07/02/2007  . Morbid obesity ([place]) 07/02/2007  . Essential hypertension 07/02/2007   Clarene Critchley PT, DPT 12:02 PM, 04/03/18 [place], [place], [address] Phone: [phone]   Fax:  [phone]  Name: JODENE POLYAK MRN: [id number] Date of Birth: Jun 06, 1950

## 2018-04-08 ENCOUNTER — Ambulatory Visit (HOSPITAL_COMMUNITY): Payer: PPO | Admitting: Physical Therapy

## 2018-04-08 DIAGNOSIS — R29898 Other symptoms and signs involving the musculoskeletal system: Secondary | ICD-10-CM

## 2018-04-08 DIAGNOSIS — M545 Low back pain: Principal | ICD-10-CM

## 2018-04-08 DIAGNOSIS — G8929 Other chronic pain: Secondary | ICD-10-CM

## 2018-04-08 NOTE — Therapy (Signed)
[facility] [place], [place], [address] Phone: [phone]   Fax:  [phone]  Physical Therapy Treatment  Patient Details  Name: Krista Strickland MRN: [id number] Date of Birth: Mar 07, 1950 Referring Provider (PT): Sanjuana Kava, MD   Encounter Date: 04/08/2018  PT End of Session - 04/08/18 1150    Visit Number  11    Number of Visits  15    Date for PT Re-Evaluation  04/02/18    Authorization Type  [employer] (No visit limit)    Authorization Time Period  03/05/18 - 04/02/18; 04/03/18- 04/24/18    Authorization - Visit Number  11    Authorization - Number of Visits  15    PT Start Time  1030    PT Stop Time  1115    PT Time Calculation (min)  45 min    Activity Tolerance  Patient tolerated treatment well;No increased pain    Behavior During Therapy  WFL for tasks assessed/performed       Past Medical History:  Diagnosis Date  . Arthritis   . Cancer ([place])    uterine  . Chronic kidney disease   . Coronary artery disease   . Diabetes mellitus, type 2 ([place])   . GERD (gastroesophageal reflux disease)    no medication in 2017  . Gout   . History of MRSA infection 03/2009   pt denies this  . Hypercalcemia 2017   managed by nephrology  . Hyperlipidemia   . Hypertension   . Obesity   . Psoriasis   . Stroke [facility]) 2013   "light"    Past Surgical History:  Procedure Laterality Date  . ABDOMINAL HYSTERECTOMY    . APPENDECTOMY  2012  . COLONOSCOPY WITH PROPOFOL N/A 08/24/2016   Procedure: COLONOSCOPY WITH PROPOFOL;  Surgeon: Rogene Houston, MD;  [facility];  Service: Endoscopy;  Laterality: N/A;  10:30  . excison of rt breast cyst    . LAPAROSCOPIC SALPINGO OOPHERECTOMY Right 04/22/2012   Procedure: LAPAROSCOPIC SALPINGO OOPHORECTOMY;  Surgeon: Jonnie Kind, MD;  Location: [facility] OR[facility];  Service: Gynecology;  Laterality: Right;  end 11:17  . MASS EXCISION N/A 04/22/2012   Procedure: EXCISION SKIN  TAGS NECK AND HEAD;  Surgeon: Jonnie Kind, MD;  Location: [facility];  Service: Gynecology;  Laterality: N/A;  start 11:19  . PARTIAL HYSTERECTOMY    . POLYPECTOMY  08/24/2016   Procedure: POLYPECTOMY;  Surgeon: Rogene Houston, MD;  [facility];  Service: Endoscopy;;  colon  . rt,. neck biopsy      There were no vitals filed for this visit.  Subjective Assessment - 04/08/18 1155    Subjective  pt states her pain is 6/10 today in her lower back.  Presents slightly forward bent and requires cues to correct.  States she does have pain down her legs as well, but states this is not a new symptom, she has had this the whole time.     Pain Radiating Towards  radiculopathy down LE's                       [facility] Adult PT Treatment/Exercise - 04/08/18 0001      Lumbar Exercises: Stretches   Active Hamstring Stretch  Right;Left;30 seconds;3 reps    Active Hamstring Stretch Limitations  Standing with leg on 12'' step      Lumbar Exercises: Standing   Functional Squats  10  reps    Functional Squats Limitations  3D hip excursions 10 reps each    Other Standing Lumbar Exercises  SLS with vectors forward side and back 5x 3'' holds each LE. Tandem stance 3x30 seconds each LE forward intermittent UE support    Other Standing Lumbar Exercises  Hip abduction and extension x 15 each LE bilateral UE support      Lumbar Exercises: Seated   Other Seated Lumbar Exercises  thoracic retraction 10 reps n good posture      Manual Therapy   Manual Therapy  Soft tissue mobilization    Manual therapy comments  All manual completed separately from other skilled interventions    Soft tissue mobilization  Patient prone with pillow under hips for comfort completed to bil lower lumbar, Rt>Lt.  First 2 minutes patient POE.                PT Short Term Goals - 04/03/18 1021      PT SHORT TERM GOAL #1   Title  Patient will report understanding and regular compliance with HEP in  order to improve, strength, balance, functional mobility, and decrease back pain.     Time  2    Period  Weeks    Status  Achieved      PT SHORT TERM GOAL #2   Title  Patient will demonstrate ability to maintain single limb stance on each lower extremity for at least 5 seconds on each lower extremity indicating improved stability and balance to negotiate stairs and curbs.     Baseline  04/03/18: Patient achieved on the left LE, but not the right.     Time  2    Period  Weeks    Status  Partially Met        PT Long Term Goals - 04/03/18 1022      PT LONG TERM GOAL #1   Title  Patient will demonstrate improvement of at least 1/2 MMT strength grade in all deficient muscle groups in order to improve mechanics with transfers and ambulation.     Time  4    Period  Weeks    Status  Achieved      PT LONG TERM GOAL #2   Title  Patient will report ability to stand for at least 20 minutes in order to prepare a meal at home without requiring to sit.     Time  3    Period  Weeks    Status  On-going    Target Date  04/24/18      PT LONG TERM GOAL #3   Title  Patient will report that her pain has not exceeded a 3/10 over the course of a 1 week period indicating improved tolerance to daily activities.     Time  3    Period  Weeks    Status  On-going    Target Date  04/24/18      PT LONG TERM GOAL #4   Title  Patient will demonstrate improvement of at least 1 MMT strength grade from time of evaluation in all deficient muscle groups in order to improve mechanics with transfers and ambulation.     Time  3    Period  Weeks    Status  New    Target Date  04/24/18            Plan - 04/08/18 1151    Clinical Impression Statement  continued with LE strengthening, stretching and postural  eduction activities.  Pt with verbal and tactile cues to complete exercises correctly and in good posturing.  Pt tends to become foward bent during activity.  Completed POE with pt verbalizeing relief of  symptoms, no radiculopathy during and following.  manual completed in prone, also with good results and reduced pain following.  Educated on posture and added scapular retraciton in seated.     Rehab Potential  Fair    Clinical Impairments Affecting Rehab Potential  Positive: Motivated; Negative: Comorbidites and chronicity of issue    PT Frequency  2x / week    PT Duration  3 weeks    PT Treatment/Interventions  ADLs/Self Care Home Management;Moist Heat;DME Instruction;Gait training;Stair training;Functional mobility training;Therapeutic activities;Therapeutic exercise;Balance training;Neuromuscular re-education;Patient/family education;Orthotic Fit/Training;Manual techniques;Passive range of motion;Dry needling;Energy conservation;Taping    PT Next Visit Plan  Lumbar mobility exercises. Work on patient's posture, upright standing.  Next session begin postural theraband exercises.  Follow up if extension or flexion based exercises are more benefial in decreasing patient's pain.     PT Home Exercise Plan  2/6: bridges, single knee to chest; 03/13/18: Hamstring stretch supine with towel 3x30'' 1-2x/day    Consulted and Agree with Plan of Care  Patient       Patient will benefit from skilled therapeutic intervention in order to improve the following deficits and impairments:  Abnormal gait, Decreased balance, Decreased endurance, Decreased mobility, Difficulty walking, Increased muscle spasms, Decreased range of motion, Improper body mechanics, Decreased activity tolerance, Decreased strength, Increased fascial restricitons, Postural dysfunction, Pain  Visit Diagnosis: Chronic bilateral low back pain, unspecified whether sciatica present  Other symptoms and signs involving the musculoskeletal system     Problem List Patient Active Problem List   Diagnosis Date Noted  . Type 2 diabetes mellitus with diabetic neuropathy, unspecified ([place]) 09/08/2017  . Special screening for malignant neoplasms,  colon 07/06/2016  . Diabetic neuropathy ([place]) 11/09/2015  . Annual physical exam 12/16/2013  . Multinodular goiter (nontoxic) 09/20/2013  . Hypertriglyceridemia 08/05/2013  . Back pain 04/16/2013  . Type 2 diabetes mellitus with hyperglycemia ([place]) 12/07/2012  . 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  . CVA (cerebral vascular accident) ([place]) 07/23/2011  . CKD (chronic kidney disease) stage 3, GFR 30-59 ml/min ([facility]) 11/07/2009  . SLEEP APNEA 09/06/2009  . SKIN TAG 07/14/2009  . Hyperuricemia 11/04/2008  . Hyperlipidemia LDL goal <100 07/02/2007  . Morbid obesity ([place]) 07/02/2007  . Essential hypertension 07/02/2007   Teena Irani, PTA/CLT [phone]  Teena Irani 04/08/2018, 11:56 AM  [facility] [address] [place], [place], [address] Phone: [phone]   Fax:  [phone]  Name: Krista Strickland MRN: [id number] Date of Birth: [birth year]

## 2018-04-11 ENCOUNTER — Other Ambulatory Visit: Payer: Self-pay

## 2018-04-11 ENCOUNTER — Encounter (HOSPITAL_COMMUNITY): Payer: Self-pay | Admitting: Physical Therapy

## 2018-04-11 ENCOUNTER — Ambulatory Visit (HOSPITAL_COMMUNITY): Payer: PPO | Admitting: Physical Therapy

## 2018-04-11 DIAGNOSIS — R29898 Other symptoms and signs involving the musculoskeletal system: Secondary | ICD-10-CM

## 2018-04-11 DIAGNOSIS — M545 Low back pain: Secondary | ICD-10-CM | POA: Diagnosis not present

## 2018-04-11 DIAGNOSIS — G8929 Other chronic pain: Secondary | ICD-10-CM

## 2018-04-11 NOTE — Therapy (Signed)
[facility] [place], [place], [address] Phone: [phone]   Fax:  [phone]  Physical Therapy Treatment  Patient Details  Name: Krista Strickland MRN: [id number] Date of Birth: 04-Jun-1950 Referring Provider (PT): Sanjuana Kava, MD   Encounter Date: 04/11/2018  PT End of Session - 04/11/18 1036    Visit Number  12    Number of Visits  15    Date for PT Re-Evaluation  04/24/18   Updated for new Cert   Authorization Type  [employer] (No visit limit)    Authorization Time Period  03/05/18 - 04/02/18; 04/03/18- 04/24/18    Authorization - Visit Number  12    Authorization - Number of Visits  15    PT Start Time  6314    PT Stop Time  1109    PT Time Calculation (min)  38 min    Activity Tolerance  Patient tolerated treatment well;No increased pain    Behavior During Therapy  WFL for tasks assessed/performed       Past Medical History:  Diagnosis Date  . Arthritis   . Cancer ([place])    uterine  . Chronic kidney disease   . Coronary artery disease   . Diabetes mellitus, type 2 ([place])   . GERD (gastroesophageal reflux disease)    no medication in 2017  . Gout   . History of MRSA infection 03/2009   pt denies this  . Hypercalcemia 2017   managed by nephrology  . Hyperlipidemia   . Hypertension   . Obesity   . Psoriasis   . Stroke [facility]) 2013   "light"    Past Surgical History:  Procedure Laterality Date  . ABDOMINAL HYSTERECTOMY    . APPENDECTOMY  2012  . COLONOSCOPY WITH PROPOFOL N/A 08/24/2016   Procedure: COLONOSCOPY WITH PROPOFOL;  Surgeon: Rogene Houston, MD;  Location: [facility];  Service: Endoscopy;  Laterality: N/A;  10:30  . excison of rt breast cyst    . LAPAROSCOPIC SALPINGO OOPHERECTOMY Right 04/22/2012   Procedure: LAPAROSCOPIC SALPINGO OOPHORECTOMY;  Surgeon: Jonnie Kind, MD;  Location: [facility];  Service: Gynecology;  Laterality: Right;  end 11:17  . MASS EXCISION N/A 04/22/2012   Procedure: EXCISION SKIN TAGS NECK AND HEAD;  Surgeon: Jonnie Kind, MD;  Location: [facility];  Service: Gynecology;  Laterality: N/A;  start 11:19  . PARTIAL HYSTERECTOMY    . POLYPECTOMY  08/24/2016   Procedure: POLYPECTOMY;  Surgeon: Rogene Houston, MD;  Location: [facility];  Service: Endoscopy;;  colon  . rt,. neck biopsy      There were no vitals filed for this visit.  Subjective Assessment - 04/11/18 1035    Subjective  Patient reported that she had pain after last session and did not feel she could stand for that long. Patient reported a 5/10 back pain today.     Currently in Pain?  Yes    Pain Score  5     Pain Location  Back    Pain Orientation  Lower    Pain Descriptors / Indicators  Aching    Pain Type  Chronic pain                       [facility] Adult PT Treatment/Exercise - 04/11/18 0001      Lumbar Exercises: Stretches   Active Hamstring Stretch  Right;Left;30 seconds;3 reps    Active Hamstring Stretch Limitations  Supine with strap     Lower Trunk Rotation  10 seconds    Lower Trunk Rotation Limitations  10 repetitions      Lumbar Exercises: Standing   Functional Squats  10 reps      Lumbar Exercises: Supine   Ab Set  15 reps    AB Set Limitations  5'' holds    Pelvic Tilt  10 reps;5 seconds    Pelvic Tilt Limitations  With maximal verbal and tactile cues      Manual Therapy   Manual Therapy  Soft tissue mobilization    Manual therapy comments  All manual completed separately from other skilled interventions    Soft tissue mobilization  Patient prone with pillow under hips for comfort completed to bil lower lumbar, Rt>Lt to patient's tolerance             PT Education - 04/11/18 1036    Education Details  Discussed benefits of progressing exercises and discussed plan to modify exercises to patient's tolerance.     Person(s) Educated  Patient    Methods  Explanation    Comprehension  Verbalized understanding       PT Short Term  Goals - 04/03/18 1021      PT SHORT TERM GOAL #1   Title  Patient will report understanding and regular compliance with HEP in order to improve, strength, balance, functional mobility, and decrease back pain.     Time  2    Period  Weeks    Status  Achieved      PT SHORT TERM GOAL #2   Title  Patient will demonstrate ability to maintain single limb stance on each lower extremity for at least 5 seconds on each lower extremity indicating improved stability and balance to negotiate stairs and curbs.     Baseline  04/03/18: Patient achieved on the left LE, but not the right.     Time  2    Period  Weeks    Status  Partially Met        PT Long Term Goals - 04/03/18 1022      PT LONG TERM GOAL #1   Title  Patient will demonstrate improvement of at least 1/2 MMT strength grade in all deficient muscle groups in order to improve mechanics with transfers and ambulation.     Time  4    Period  Weeks    Status  Achieved      PT LONG TERM GOAL #2   Title  Patient will report ability to stand for at least 20 minutes in order to prepare a meal at home without requiring to sit.     Time  3    Period  Weeks    Status  On-going    Target Date  04/24/18      PT LONG TERM GOAL #3   Title  Patient will report that her pain has not exceeded a 3/10 over the course of a 1 week period indicating improved tolerance to daily activities.     Time  3    Period  Weeks    Status  On-going    Target Date  04/24/18      PT LONG TERM GOAL #4   Title  Patient will demonstrate improvement of at least 1 MMT strength grade from time of evaluation in all deficient muscle groups in order to improve mechanics with transfers and ambulation.     Time  3  Period  Weeks    Status  New    Target Date  04/24/18            Plan - 04/11/18 1123    Clinical Impression Statement  This session patient reported having increased pain following last session. Educated patient on benefits of therapy and goals to  progress to standing exercises as able. Discussed with patient the plan to modify exercises more on the mat this session. Modified exercises to performing more in supine this session. This session included posterior pelvic tilts to improve mobility and postural awareness. Did complete standing functional squats for continued functional strengthening and plan to continue to slowly incorporate more standing exercises. Ended session with manual therapy to decrease patient's pain with patient reporting 1/10 pain following manual.     Rehab Potential  Fair    Clinical Impairments Affecting Rehab Potential  Positive: Motivated; Negative: Comorbidites and chronicity of issue    PT Frequency  2x / week    PT Duration  3 weeks    PT Treatment/Interventions  ADLs/Self Care Home Management;Moist Heat;DME Instruction;Gait training;Stair training;Functional mobility training;Therapeutic activities;Therapeutic exercise;Balance training;Neuromuscular re-education;Patient/family education;Orthotic Fit/Training;Manual techniques;Passive range of motion;Dry needling;Energy conservation;Taping    PT Next Visit Plan  Slowly progress standing exercises, possibly adding 1-2 more in standing next session depending on paitent's tolerance this session. Next session begin postural theraband exercises.  Follow up if extension or flexion based exercises are more benefial in decreasing patient's pain.     PT Home Exercise Plan  2/6: bridges, single knee to chest; 03/13/18: Hamstring stretch supine with towel 3x30'' 1-2x/day    Consulted and Agree with Plan of Care  Patient       Patient will benefit from skilled therapeutic intervention in order to improve the following deficits and impairments:  Abnormal gait, Decreased balance, Decreased endurance, Decreased mobility, Difficulty walking, Increased muscle spasms, Decreased range of motion, Improper body mechanics, Decreased activity tolerance, Decreased strength, Increased fascial  restricitons, Postural dysfunction, Pain  Visit Diagnosis: Chronic bilateral low back pain, unspecified whether sciatica present  Other symptoms and signs involving the musculoskeletal system     Problem List Patient Active Problem List   Diagnosis Date Noted  . Type 2 diabetes mellitus with diabetic neuropathy, unspecified ([place]) 09/08/2017  . Special screening for malignant neoplasms, colon 07/06/2016  . Diabetic neuropathy ([place]) 11/09/2015  . Annual physical exam 12/16/2013  . Multinodular goiter (nontoxic) 09/20/2013  . Hypertriglyceridemia 08/05/2013  . Back pain 04/16/2013  . Type 2 diabetes mellitus with hyperglycemia ([place]) 12/07/2012  . 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  . CVA (cerebral vascular accident) ([place]) 07/23/2011  . CKD (chronic kidney disease) stage 3, GFR 30-59 ml/min ([facility]) 11/07/2009  . SLEEP APNEA 09/06/2009  . SKIN TAG 07/14/2009  . Hyperuricemia 11/04/2008  . Hyperlipidemia LDL goal <100 07/02/2007  . Morbid obesity ([place]) 07/02/2007  . Essential hypertension 07/02/2007   Clarene Critchley PT, DPT 11:26 AM, 04/11/18 [facility] [address] [place], [place], [address] Phone: [phone]   Fax:  [phone]  Name: Krista Strickland MRN: [id number] Date of Birth: [birth year]

## 2018-04-15 ENCOUNTER — Other Ambulatory Visit: Payer: Self-pay

## 2018-04-15 ENCOUNTER — Ambulatory Visit (HOSPITAL_COMMUNITY): Payer: PPO | Admitting: Physical Therapy

## 2018-04-15 ENCOUNTER — Encounter (HOSPITAL_COMMUNITY): Payer: Self-pay | Admitting: Physical Therapy

## 2018-04-15 DIAGNOSIS — M545 Low back pain: Secondary | ICD-10-CM | POA: Diagnosis not present

## 2018-04-15 DIAGNOSIS — R29898 Other symptoms and signs involving the musculoskeletal system: Secondary | ICD-10-CM

## 2018-04-15 DIAGNOSIS — G8929 Other chronic pain: Secondary | ICD-10-CM

## 2018-04-15 NOTE — Therapy (Addendum)
[facility] [place], [place], [address] Phone: [phone]   Fax:  [phone]  Physical Therapy Treatment  Patient Details  Name: Krista Strickland MRN: [id number] Date of Birth: 1950-05-16 Referring Provider (PT): Sanjuana Kava, MD   Encounter Date: 04/15/2018  PT End of Session - 04/15/18 1145    Visit Number  13    Number of Visits  15    Date for PT Re-Evaluation  04/24/18   Updated for new Cert   Authorization Type  [employer] (No visit limit)    Authorization Time Period  03/05/18 - 04/02/18; 04/03/18- 04/24/18    Authorization - Visit Number  13    Authorization - Number of Visits  15    PT Start Time  1115    PT Stop Time  1153    PT Time Calculation (min)  38 min    Activity Tolerance  Patient tolerated treatment well;No increased pain    Behavior During Therapy  WFL for tasks assessed/performed       Past Medical History:  Diagnosis Date  . Arthritis   . Cancer ([place])    uterine  . Chronic kidney disease   . Coronary artery disease   . Diabetes mellitus, type 2 ([place])   . GERD (gastroesophageal reflux disease)    no medication in 2017  . Gout   . History of MRSA infection 03/2009   pt denies this  . Hypercalcemia 2017   managed by nephrology  . Hyperlipidemia   . Hypertension   . Obesity   . Psoriasis   . Stroke [facility]) 2013   "light"    Past Surgical History:  Procedure Laterality Date  . ABDOMINAL HYSTERECTOMY    . APPENDECTOMY  2012  . COLONOSCOPY WITH PROPOFOL N/A 08/24/2016   Procedure: COLONOSCOPY WITH PROPOFOL;  Surgeon: Rogene Houston, MD;  [facility];  Service: Endoscopy;  Laterality: N/A;  10:30  . excison of rt breast cyst    . LAPAROSCOPIC SALPINGO OOPHERECTOMY Right 04/22/2012   Procedure: LAPAROSCOPIC SALPINGO OOPHORECTOMY;  Surgeon: Jonnie Kind, MD;  Location: [facility];  Service: Gynecology;  Laterality: Right;  end 11:17  . MASS EXCISION N/A 04/22/2012   Procedure: EXCISION SKIN TAGS NECK AND HEAD;  Surgeon: Jonnie Kind, MD;  Location: [facility];  Service: Gynecology;  Laterality: N/A;  start 11:19  . PARTIAL HYSTERECTOMY    . POLYPECTOMY  08/24/2016   Procedure: POLYPECTOMY;  Surgeon: Rogene Houston, MD;  Location[facility];  Service: Endoscopy;;  colon  . rt,. neck biopsy      There were no vitals filed for this visit.  Subjective Assessment - 04/15/18 1121    Subjective  Patient reported she has some discomfort, but would not quantify.     Currently in Pain?  No/denies                       [facility] Adult PT Treatment/Exercise - 04/15/18 0001      Lumbar Exercises: Stretches   Active Hamstring Stretch  Right;Left;30 seconds;3 reps    Active Hamstring Stretch Limitations  Supine with strap     Lower Trunk Rotation  10 seconds    Lower Trunk Rotation Limitations  10 repetitions      Lumbar Exercises: Standing   Functional Squats  10 reps    Forward Lunge  10 reps    Forward Lunge Limitations  4'' step  with max verbal and tactile cues for form      Lumbar Exercises: Seated   Other Seated Lumbar Exercises  Postural strengthening rows and scapular retraction x10 with RTB in sitting with maximal verbal cues, demonstration, and tactile cues      Lumbar Exercises: Supine   Ab Set  15 reps    AB Set Limitations  5'' holds    Pelvic Tilt  5 seconds;15 reps    Pelvic Tilt Limitations  With moderate verbal and tactile cues               PT Short Term Goals - 04/03/18 1021      PT SHORT TERM GOAL #1   Title  Patient will report understanding and regular compliance with HEP in order to improve, strength, balance, functional mobility, and decrease back pain.     Time  2    Period  Weeks    Status  Achieved      PT SHORT TERM GOAL #2   Title  Patient will demonstrate ability to maintain single limb stance on each lower extremity for at least 5 seconds on each lower extremity indicating improved stability  and balance to negotiate stairs and curbs.     Baseline  04/03/18: Patient achieved on the left LE, but not the right.     Time  2    Period  Weeks    Status  Partially Met        PT Long Term Goals - 04/03/18 1022      PT LONG TERM GOAL #1   Title  Patient will demonstrate improvement of at least 1/2 MMT strength grade in all deficient muscle groups in order to improve mechanics with transfers and ambulation.     Time  4    Period  Weeks    Status  Achieved      PT LONG TERM GOAL #2   Title  Patient will report ability to stand for at least 20 minutes in order to prepare a meal at home without requiring to sit.     Time  3    Period  Weeks    Status  On-going    Target Date  04/24/18      PT LONG TERM GOAL #3   Title  Patient will report that her pain has not exceeded a 3/10 over the course of a 1 week period indicating improved tolerance to daily activities.     Time  3    Period  Weeks    Status  On-going    Target Date  04/24/18      PT LONG TERM GOAL #4   Title  Patient will demonstrate improvement of at least 1 MMT strength grade from time of evaluation in all deficient muscle groups in order to improve mechanics with transfers and ambulation.     Time  3    Period  Weeks    Status  New    Target Date  04/24/18            Plan - 04/15/18 1214    Clinical Impression Statement  Continued with established plan of care this session. Progressed patient with postural strengthening this session with seated postural strengthening in sitting. Also, patient performed more exercises in standing with standing lunges this session onto a 4'' step. Patient required frequent verbal and tactile cues for form throughout session.     Rehab Potential  Fair    Clinical Impairments Affecting  Rehab Potential  Positive: Motivated; Negative: Comorbidites and chronicity of issue    PT Frequency  2x / week    PT Duration  3 weeks    PT Treatment/Interventions  ADLs/Self Care Home  Management;Moist Heat;DME Instruction;Gait training;Stair training;Functional mobility training;Therapeutic activities;Therapeutic exercise;Balance training;Neuromuscular re-education;Patient/family education;Orthotic Fit/Training;Manual techniques;Passive range of motion;Dry needling;Energy conservation;Taping    PT Next Visit Plan  Slowly progress standing exercises, possibly adding 1-2 more in standing next session depending on paitent's tolerance this session. Next session begin postural theraband exercises.  Follow up if extension or flexion based exercises are more benefial in decreasing patient's pain.     PT Home Exercise Plan  2/6: bridges, single knee to chest; 03/13/18: Hamstring stretch supine with towel 3x30'' 1-2x/day    Consulted and Agree with Plan of Care  Patient       Patient will benefit from skilled therapeutic intervention in order to improve the following deficits and impairments:  Abnormal gait, Decreased balance, Decreased endurance, Decreased mobility, Difficulty walking, Increased muscle spasms, Decreased range of motion, Improper body mechanics, Decreased activity tolerance, Decreased strength, Increased fascial restricitons, Postural dysfunction, Pain  Visit Diagnosis: Chronic bilateral low back pain, unspecified whether sciatica present  Other symptoms and signs involving the musculoskeletal system     Problem List Patient Active Problem List   Diagnosis Date Noted  . Type 2 diabetes mellitus with diabetic neuropathy, unspecified ([place]) 09/08/2017  . Special screening for malignant neoplasms, colon 07/06/2016  . Diabetic neuropathy ([place]) 11/09/2015  . Annual physical exam 12/16/2013  . Multinodular goiter (nontoxic) 09/20/2013  . Hypertriglyceridemia 08/05/2013  . Back pain 04/16/2013  . Type 2 diabetes mellitus with hyperglycemia ([place]) 12/07/2012  . 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  . CVA (cerebral vascular accident) ([place]) 07/23/2011  . CKD (chronic  kidney disease) stage 3, GFR 30-59 ml/min ([facility]) 11/07/2009  . SLEEP APNEA 09/06/2009  . SKIN TAG 07/14/2009  . Hyperuricemia 11/04/2008  . Hyperlipidemia LDL goal <100 07/02/2007  . Morbid obesity ([place]) 07/02/2007  . Essential hypertension 07/02/2007   Clarene Critchley PT, DPT 12:17 PM, 04/15/18 [facility] [place], [place], [address] Phone: [phone]   Fax:  [phone]  Name: SOKHNA CHRISTOPH MRN: [id number] Date of Birth: 09/21/1950

## 2018-04-16 ENCOUNTER — Encounter: Payer: Self-pay | Admitting: Orthopaedic Surgery

## 2018-04-16 ENCOUNTER — Other Ambulatory Visit: Payer: Self-pay

## 2018-04-16 ENCOUNTER — Ambulatory Visit: Payer: PPO | Admitting: Orthopaedic Surgery

## 2018-04-16 VITALS — BP 130/58 | HR 61 | Ht 63.0 in | Wt 202.0 lb

## 2018-04-16 DIAGNOSIS — G8929 Other chronic pain: Secondary | ICD-10-CM

## 2018-04-16 DIAGNOSIS — M5442 Lumbago with sciatica, left side: Secondary | ICD-10-CM

## 2018-04-16 DIAGNOSIS — E0849 Diabetes mellitus due to underlying condition with other diabetic neurological complication: Secondary | ICD-10-CM | POA: Diagnosis not present

## 2018-04-16 MED ORDER — HYDROCODONE-ACETAMINOPHEN 5-325 MG PO TABS: ORAL_TABLET | ORAL | 0 refills | Status: DC

## 2018-04-16 NOTE — Progress Notes (Signed)
Patient IR:Krista Strickland, female DOB:02-Jul-1950, 68 y.o. ZYS:[id number]  Chief Complaint  Patient presents with  . Back Pain    lumbar    HPI  Krista Strickland is a 68 y.o. female who has chronic lower back pain and some posture problems related to that.  She has diabetes with peripheral neuropathy as well.  She is improved with the PT she has been having. I have reviewed the notes.  She has more PT scheduled. She has no new trauma.  She is doing her exercises at home.   Body mass index is 35.78 kg/m.  ROS  Review of Systems  Constitutional: Positive for activity change.  Musculoskeletal: Positive for arthralgias, back pain, gait problem and joint swelling.  All other systems reviewed and are negative.   All other systems reviewed and are negative.  The following is a summary of the past history medically, past history surgically, known current medicines, social history and family history.  This information is gathered electronically by the computer from prior information and documentation.  I review this each visit and have found including this information at this point in the chart is beneficial and informative.    Past Medical History:  Diagnosis Date  . Arthritis   . Cancer ([place])    uterine  . Chronic kidney disease   . Coronary artery disease   . Diabetes mellitus, type 2 ([place])   . GERD (gastroesophageal reflux disease)    no medication in 2017  . Gout   . History of MRSA infection 03/2009   pt denies this  . Hypercalcemia 2017   managed by nephrology  . Hyperlipidemia   . Hypertension   . Obesity   . Psoriasis   . Stroke [facility]) 2013   "light"    Past Surgical History:  Procedure Laterality Date  . ABDOMINAL HYSTERECTOMY    . APPENDECTOMY  2012  . COLONOSCOPY WITH PROPOFOL N/A 08/24/2016   Procedure: COLONOSCOPY WITH PROPOFOL;  Surgeon: Rogene Houston, MD;  Location: [facility] ENDO SU[facility];  Service: Endoscopy;  Laterality: N/A;  10:30  . excison of rt breast  cyst    . LAPAROSCOPIC SALPINGO OOPHERECTOMY Right 04/22/2012   Procedure: LAPAROSCOPIC SALPINGO OOPHORECTOMY;  Surgeon: Jonnie Kind, MD;  Location: [facility] ORS;  Service: Gynecology;  Laterality: Right;  end 11:17  . MASS EXCISION N/A 04/22/2012   Procedure: EXCISION SKIN TAGS NECK AND HEAD;  Surgeon: Jonnie Kind, MD;  Location: [facility] ORS;  Service: Gynecology;  Laterality: N/A;  start 11:19  . PARTIAL HYSTERECTOMY    . POLYPECTOMY  08/24/2016   Procedure: POLYPECTOMY;  Surgeon: Rogene Houston, MD;  Location: [facility] END[facility] SU[facility];  Service: Endoscopy;;  colon  . rt,. neck biopsy      Family History  Problem Relation Age of Onset  . Hypertension Brother   . Gout Brother   . Prostate cancer Brother   . Hypertension Brother   . Prostate cancer Brother   . Gout Brother   . Hypertension Sister   . Gout Sister   . Cancer Sister 82       pancreatic   . Leukemia Sister 47  . Gout Sister   . Prostate cancer Brother   . Diabetes Neg Hx     Social History Social History   Tobacco Use  . Smoking status: Former Smoker    Packs/day: 0.25    Years: 12.00    Pack years: 3.00    Last attempt to quit: 07/16/1978  Years since quitting: 39.7  . Smokeless tobacco: Never Used  Substance Use Topics  . Alcohol use: No  . Drug use: No    Allergies  Allergen Reactions  . Benazepril Swelling  . Metronidazole Hives  . Mobic [Meloxicam] Hives  . Penicillins Hives and Swelling    Has patient had a PCN reaction causing immediate rash, facial/tongue/throat swelling, SOB or lightheadedness with hypotension: No Has patient had a PCN reaction causing severe rash involving mucus membranes or skin necrosis: Yes Has patient had a PCN reaction that required hospitalization: No Has patient had a PCN reaction occurring within the last 10 years: No If all of the above answers are "NO", then may proceed with Cephalosporin use.   . Sulfonamide Derivatives Hives    Current Outpatient Medications   Medication Sig Dispense Refill  . allopurinol (ZYLOPRIM) 300 MG tablet Take 1 tablet (300 mg total) by mouth daily. 30 tablet 5  . aspirin 325 MG tablet Take 1 tablet (325 mg total) by mouth daily.    . calcitRIOL (ROCALTROL) 0.25 MCG capsule Take 0.25 mcg by mouth 3 (three) times a week. Mondays, Wednesdays,Fridays    . cyclobenzaprine (FLEXERIL) 10 MG tablet Take 10 mg by mouth 3 (three) times daily as needed for muscle spasms.     Marland Kitchen DILT-XR 180 MG 24 hr capsule TAKE 1 CAPSULE BY MOUTH ONCE DAILY 90 capsule 1  . docusate sodium (COLACE) 100 MG capsule Take 100 mg by mouth 2 (two) times daily. Constipation    . DULoxetine (CYMBALTA) 30 MG capsule Take 30 mg by mouth daily.    Marland Kitchen ezetimibe (ZETIA) 10 MG tablet Take 1 tablet (10 mg total) by mouth daily. 90 tablet 3  . gabapentin (NEURONTIN) 300 MG capsule Take 300 mg by mouth 3 (three) times daily.     Marland Kitchen HYDROcodone-acetaminophen (NORCO/VICODIN) 5-325 MG tablet One tablet every six hours for pain.  Limit 7 days. 28 tablet 0  . insulin NPH-regular Human (NOVOLIN 70/30 RELION) (70-30) 100 UNIT/ML injection Inject 190 Units into the skin daily with breakfast. 70 mL 11  . insulin regular (NOVOLIN R RELION) 100 units/mL injection Inject 0.8 mLs (80 Units total) into the skin daily with supper. 30 mL 11  . metolazone (ZAROXOLYN) 5 MG tablet TAKE 1 TABLET BY MOUTH ONCE DAILY 90 tablet 1  . metoprolol tartrate (LOPRESSOR) 100 MG tablet TAKE 1 TABLET BY MOUTH TWICE DAILY 180 tablet 2  . midodrine (PROAMATINE) 5 MG tablet Take 5 mg by mouth 2 (two) times daily.     . ONE TOUCH ULTRA TEST test strip USE 1 STRIP TO CHECK GLUCOSE TWICE DAILY 100 each 4  . ONETOUCH DELICA LANCETS 61W MISC 1 Device by Does not apply route 2 (two) times daily. 60 each 11  . potassium chloride (K-DUR) 10 MEQ tablet TAKE 3 TABLETS BY MOUTH THREE TIMES DAILY 270 tablet 0  . pravastatin (PRAVACHOL) 20 MG tablet TAKE 1 TABLET BY MOUTH ONCE DAILY BEFORE BREAKFAST 90 tablet 0  .  RELION INSULIN SYR 1CC/30G 30G X 5/16" 1 ML MISC USE ONE SYRINGE TO INJECT INSULIN SUBCUTANEOUSLY THREE TIMES DAILY AS DIRECTED 150 each 0  . RELION INSULIN SYRINGE 31G X 15/64" 1 ML MISC USE 1 SYRINGE THREE TIMES DAILY AS DIRECTED 150 each 0  . spironolactone (ALDACTONE) 25 MG tablet TAKE 1 TABLET BY MOUTH ONCE DAILY 90 tablet 1  . tiZANidine (ZANAFLEX) 4 MG tablet One by mouth every night before bed as needed for spasm  30 tablet 3  . torsemide (DEMADEX) 20 MG tablet Take 80 mg by mouth daily.      No current facility-administered medications for this visit.      Physical Exam  Blood pressure (!) 130/58, pulse 61, height 5\' 3"  (1.6 m), weight 202 lb (91.6 kg).  Constitutional: overall normal hygiene, normal nutrition, well developed, normal grooming, normal body habitus. Assistive device:none  Musculoskeletal: gait and station Limp none, muscle tone and strength are normal, no tremors or atrophy is present.  .  Neurological: coordination overall normal.  Deep tendon reflex/nerve stretch intact.  Sensation normal.  Cranial nerves II-XII intact.   Skin:   Normal overall no scars, lesions, ulcers or rashes. No psoriasis.  Psychiatric: Alert and oriented x 3.  Recent memory intact, remote memory unclear.  Normal mood and affect. Well groomed.  Good eye contact.  Cardiovascular: overall no swelling, no varicosities, no edema bilaterally, normal temperatures of the legs and arms, no clubbing, cyanosis and good capillary refill.  Lymphatic: palpation is normal.  Spine/Pelvis examination:  Inspection:  Overall, sacoiliac joint benign and hips nontender; without crepitus or defects.   Thoracic spine inspection: Alignment normal without kyphosis present   Lumbar spine inspection:  Alignment  with normal lumbar lordosis, without scoliosis apparent.   Thoracic spine palpation:  without tenderness of spinal processes   Lumbar spine palpation: without tenderness of lumbar area; without  tightness of lumbar muscles    Range of Motion:   Lumbar flexion, forward flexion is normal without pain or tenderness    Lumbar extension is full without pain or tenderness   Left lateral bend is normal without pain or tenderness   Right lateral bend is normal without pain or tenderness   Straight leg raising is normal  Strength & tone: normal   Stability overall normal stability  All other systems reviewed and are negative   The patient has been educated about the nature of the problem(s) and counseled on treatment options.  The patient appeared to understand what I have discussed and is in agreement with it.  Encounter Diagnoses  Name Primary?  . Chronic left-sided low back pain with left-sided sciatica Yes  . 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  Precautions discussed.  Continue current medications.   Return to clinic 1 month   Continue PT.  Electronically Signed Sanjuana Kava, MD 3/18/20202:48 PM

## 2018-04-17 ENCOUNTER — Ambulatory Visit (HOSPITAL_COMMUNITY): Payer: PPO | Admitting: Physical Therapy

## 2018-04-17 DIAGNOSIS — R29898 Other symptoms and signs involving the musculoskeletal system: Secondary | ICD-10-CM

## 2018-04-17 DIAGNOSIS — G8929 Other chronic pain: Secondary | ICD-10-CM

## 2018-04-17 DIAGNOSIS — M545 Low back pain: Principal | ICD-10-CM

## 2018-04-17 NOTE — Therapy (Signed)
[facility] [place], [place], [address] Phone: [phone]   Fax:  [phone]  Physical Therapy Treatment  Patient Details  Name: Krista Strickland MRN: [id number] Date of Birth: [birth year] Referring Provider (PT): Sanjuana Kava, MD   Encounter Date: 04/17/2018  PT End of Session - 04/17/18 1017    Visit Number  14    Number of Visits  15    Date for PT Re-Evaluation  04/24/18   Updated for new Cert   Authorization Type  [employer] (No visit limit)    Authorization Time Period  03/05/18 - 04/02/18; 04/03/18- 04/24/18    Authorization - Visit Number  14    Authorization - Number of Visits  15    PT Start Time  0940    PT Stop Time  1030    PT Time Calculation (min)  50 min    Activity Tolerance  Patient tolerated treatment well;No increased pain    Behavior During Therapy  WFL for tasks assessed/performed       Past Medical History:  Diagnosis Date  . Arthritis   . Cancer ([place])    uterine  . Chronic kidney disease   . Coronary artery disease   . Diabetes mellitus, type 2 ([place])   . GERD (gastroesophageal reflux disease)    no medication in 2017  . Gout   . History of MRSA infection 03/2009   pt denies this  . Hypercalcemia 2017   managed by nephrology  . Hyperlipidemia   . Hypertension   . Obesity   . Psoriasis   . Stroke [facility]) 2013   "light"    Past Surgical History:  Procedure Laterality Date  . ABDOMINAL HYSTERECTOMY    . APPENDECTOMY  2012  . COLONOSCOPY WITH PROPOFOL N/A 08/24/2016   Procedure: COLONOSCOPY WITH PROPOFOL;  Surgeon: Rogene Houston, MD;  Location: [facility];  Service: Endoscopy;  Laterality: N/A;  10:30  . excison of rt breast cyst    . LAPAROSCOPIC SALPINGO OOPHERECTOMY Right 04/22/2012   Procedure: LAPAROSCOPIC SALPINGO OOPHORECTOMY;  Surgeon: Jonnie Kind, MD;  Location: [facility] ORS;  Service: Gynecology;  Laterality: Right;  end 11:17  . MASS EXCISION N/A 04/22/2012    Procedure: EXCISION SKIN TAGS NECK AND HEAD;  Surgeon: Jonnie Kind, MD;  Location: [facility] ORS;  Service: Gynecology;  Laterality: N/A;  start 11:19  . PARTIAL HYSTERECTOMY    . POLYPECTOMY  08/24/2016   Procedure: POLYPECTOMY;  Surgeon: Rogene Houston, MD;  Location: [facility];  Service: Endoscopy;;  colon  . rt,. neck biopsy      There were no vitals filed for this visit.  Subjective Assessment - 04/17/18 0943    Subjective  pt states her pain remains the same basically.  unable to fully recall the exercises she is completeing at home but states it's basically 2 exercises.     Currently in Pain?  Yes    Pain Score  5     Pain Location  Back    Pain Orientation  Lower    Pain Type  Chronic pain                       [facility] Adult PT Treatment/Exercise - 04/17/18 0001      Lumbar Exercises: Stretches   Active Hamstring Stretch  Right;Left;30 seconds;3 reps    Active Hamstring Stretch Limitations  Supine with strap     Lower  Trunk Rotation  10 seconds    Lower Trunk Rotation Limitations  10 repetitions    Piriformis Stretch  Right;Left;2 reps;30 seconds;Limitations    Piriformis Stretch Limitations  seated      Lumbar Exercises: Standing   Functional Squats  --   unable to do in correct form   Forward Lunge  10 reps    Forward Lunge Limitations  4'' step with max verbal and tactile cues for form    Other Standing Lumbar Exercises  sit to stands for squatting 10 reps    Other Standing Lumbar Exercises  hip abduction 10 reps each with max verbal/tactile cues for form      Lumbar Exercises: Seated   Other Seated Lumbar Exercises  Postural strengthening rows and scapular retraction x10 with RTB in sitting with maximal verbal cues, demonstration, and tactile cues      Lumbar Exercises: Supine   Pelvic Tilt  5 seconds;15 reps    Pelvic Tilt Limitations  With moderate verbal and tactile cues             PT Education - 04/17/18 1038    Education Details   educated to complete her exercises 2X daily and walk more.  Educated on use of LE vs overuse of lumbar mm.    Person(s) Educated  Patient    Methods  Explanation    Comprehension  Verbalized understanding       PT Short Term Goals - 04/03/18 1021      PT SHORT TERM GOAL #1   Title  Patient will report understanding and regular compliance with HEP in order to improve, strength, balance, functional mobility, and decrease back pain.     Time  2    Period  Weeks    Status  Achieved      PT SHORT TERM GOAL #2   Title  Patient will demonstrate ability to maintain single limb stance on each lower extremity for at least 5 seconds on each lower extremity indicating improved stability and balance to negotiate stairs and curbs.     Baseline  04/03/18: Patient achieved on the left LE, but not the right.     Time  2    Period  Weeks    Status  Partially Met        PT Long Term Goals - 04/03/18 1022      PT LONG TERM GOAL #1   Title  Patient will demonstrate improvement of at least 1/2 MMT strength grade in all deficient muscle groups in order to improve mechanics with transfers and ambulation.     Time  4    Period  Weeks    Status  Achieved      PT LONG TERM GOAL #2   Title  Patient will report ability to stand for at least 20 minutes in order to prepare a meal at home without requiring to sit.     Time  3    Period  Weeks    Status  On-going    Target Date  04/24/18      PT LONG TERM GOAL #3   Title  Patient will report that her pain has not exceeded a 3/10 over the course of a 1 week period indicating improved tolerance to daily activities.     Time  3    Period  Weeks    Status  On-going    Target Date  04/24/18      PT LONG TERM GOAL #4  Title  Patient will demonstrate improvement of at least 1 MMT strength grade from time of evaluation in all deficient muscle groups in order to improve mechanics with transfers and ambulation.     Time  3    Period  Weeks    Status  New     Target Date  04/24/18            Plan - 04/17/18 1018    Clinical Impression Statement  Pt without significant change in symptoms, having more cramps in the backs of her LE's.  Pt unable to complete squats correctly this session, tends to learn forward and not use her LE's correctly.  Able to simulate this better with sit to stands.  Added standing hip abduction with max cues for form as tends to toe out using her rectus femoris rather than hip abductors.  Instructed with piriformis stretch and continued additional stretching to help reduce tightness in trunk and LE's.      Rehab Potential  Fair    Clinical Impairments Affecting Rehab Potential  Positive: Motivated; Negative: Comorbidites and chronicity of issue    PT Frequency  2x / week    PT Duration  3 weeks    PT Treatment/Interventions  ADLs/Self Care Home Management;Moist Heat;DME Instruction;Gait training;Stair training;Functional mobility training;Therapeutic activities;Therapeutic exercise;Balance training;Neuromuscular re-education;Patient/family education;Orthotic Fit/Training;Manual techniques;Passive range of motion;Dry needling;Energy conservation;Taping    PT Next Visit Plan  re-evaluate next session.    PT Home Exercise Plan  2/6: bridges, single knee to chest; 03/13/18: Hamstring stretch supine with towel 3x30'' 1-2x/day    Consulted and Agree with Plan of Care  Patient       Patient will benefit from skilled therapeutic intervention in order to improve the following deficits and impairments:  Abnormal gait, Decreased balance, Decreased endurance, Decreased mobility, Difficulty walking, Increased muscle spasms, Decreased range of motion, Improper body mechanics, Decreased activity tolerance, Decreased strength, Increased fascial restricitons, Postural dysfunction, Pain  Visit Diagnosis: Chronic bilateral low back pain, unspecified whether sciatica present  Other symptoms and signs involving the musculoskeletal  system     Problem List Patient Active Problem List   Diagnosis Date Noted  . Type 2 diabetes mellitus with diabetic neuropathy, unspecified ([place]) 09/08/2017  . Special screening for malignant neoplasms, colon 07/06/2016  . Diabetic neuropathy ([place]) 11/09/2015  . Annual physical exam 12/16/2013  . Multinodular goiter (nontoxic) 09/20/2013  . Hypertriglyceridemia 08/05/2013  . Back pain 04/16/2013  . Type 2 diabetes mellitus with hyperglycemia ([place]) 12/07/2012  . 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  . CVA (cerebral vascular accident) ([place]) 07/23/2011  . CKD (chronic kidney disease) stage 3, GFR 30-59 ml/min ([facility]) 11/07/2009  . SLEEP APNEA 09/06/2009  . SKIN TAG 07/14/2009  . Hyperuricemia 11/04/2008  . Hyperlipidemia LDL goal <100 07/02/2007  . Morbid obesity ([place]) 07/02/2007  . Essential hypertension 07/02/2007   Teena Irani, PTA/CLT [phone]  Teena Irani 04/17/2018, 10:39 AM  [facility] [address] [place], [place], [address] Phone: [phone]   Fax:  [phone]  Name: ARTHUR AYDELOTTE MRN: [id number] Date of Birth: December 08, 1950

## 2018-04-18 ENCOUNTER — Telehealth (HOSPITAL_COMMUNITY): Payer: Self-pay | Admitting: Physical Therapy

## 2018-04-18 NOTE — Telephone Encounter (Signed)
Called and left a message regarding clinic closing for the next two weeks. Patient did not answer and therapist left a message stating that patient's last visit was cancelled due to this and that patient should call the clinic to let us know if the patient would like to be scheduled to complete the final visit or if she was satisfied and would like to be finished with physical therapy. Therapist provided clinic number as well.  Clarene Critchley PT, DPT 3:29 PM, 04/18/18 [phone]

## 2018-04-21 DIAGNOSIS — M79674 Pain in right toe(s): Secondary | ICD-10-CM | POA: Diagnosis not present

## 2018-04-21 DIAGNOSIS — M79675 Pain in left toe(s): Secondary | ICD-10-CM | POA: Diagnosis not present

## 2018-04-21 DIAGNOSIS — B351 Tinea unguium: Secondary | ICD-10-CM | POA: Diagnosis not present

## 2018-04-21 DIAGNOSIS — E1140 Type 2 diabetes mellitus with diabetic neuropathy, unspecified: Secondary | ICD-10-CM | POA: Diagnosis not present

## 2018-04-22 ENCOUNTER — Encounter: Payer: Self-pay | Admitting: Endocrinology

## 2018-04-22 ENCOUNTER — Other Ambulatory Visit: Payer: Self-pay

## 2018-04-22 ENCOUNTER — Ambulatory Visit: Payer: PPO | Admitting: Endocrinology

## 2018-04-22 ENCOUNTER — Ambulatory Visit (HOSPITAL_COMMUNITY): Payer: PPO | Admitting: Physical Therapy

## 2018-04-22 VITALS — BP 126/60 | HR 60 | Temp 98.6°F | Ht 63.0 in | Wt 201.0 lb

## 2018-04-22 DIAGNOSIS — Z794 Long term (current) use of insulin: Secondary | ICD-10-CM

## 2018-04-22 DIAGNOSIS — IMO0001 Reserved for inherently not codable concepts without codable children: Secondary | ICD-10-CM

## 2018-04-22 DIAGNOSIS — E119 Type 2 diabetes mellitus without complications: Secondary | ICD-10-CM | POA: Diagnosis not present

## 2018-04-22 LAB — POCT GLYCOSYLATED HEMOGLOBIN (HGB A1C): Hemoglobin A1C: 8.2 % — AB (ref 4.0–5.6)

## 2018-04-22 MED ORDER — INSULIN NPH ISOPHANE & REGULAR (70-30) 100 UNIT/ML ~~LOC~~ SUSP: 200.0000 [IU] | Freq: Every day | SUBCUTANEOUS | 11 refills | Status: DC

## 2018-04-22 NOTE — Progress Notes (Signed)
Subjective:    Patient ID: Krista Strickland, female    DOB: August 30, 1950, 68 y.o.   MRN: [id number]  HPI Pt returns for f/u of diabetes.   DM type: insulin-requiring type 2 Dx'ed: 2202 Complications: polyneuropathy, renal insufficiency and CVA.  Therapy: insulin since 2010 GDM: never DKA: never Severe hypoglycemia: never.   Pancreatitis: never.   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 she cannot afford analogs, so we had to try 70/30 (the NPH component caused no reaction this time).  She takes 70/30 QAM and reg QPM, due to the pattern of cbg's.   Interval history:  no cbg record, but states cbg's vary from 90-200's.  It is in general lowest fasting, but otherwise, there is no trend throughout the day.  pt states she feels well in general.  She says she never misses the insulin.   Pt also has small multinodular goiter (euthyroid; f/u US in 2017 showed no change, with no thyroid nodule meeting criteria for biopsy or surveillance; she is euthyroid off rx).   Past Medical History:  Diagnosis Date  . Arthritis   . Cancer ([place])    uterine  . Chronic kidney disease   . Coronary artery disease   . Diabetes mellitus, type 2 ([place])   . GERD (gastroesophageal reflux disease)    no medication in 2017  . Gout   . History of MRSA infection 03/2009   pt denies this  . Hypercalcemia 2017   managed by nephrology  . Hyperlipidemia   . Hypertension   . Obesity   . Psoriasis   . Stroke [facility]) 2013   "light"    Past Surgical History:  Procedure Laterality Date  . ABDOMINAL HYSTERECTOMY    . APPENDECTOMY  2012  . COLONOSCOPY WITH PROPOFOL N/A 08/24/2016   Procedure: COLONOSCOPY WITH PROPOFOL;  Surgeon: Rogene Houston, MD;  Location: [facility];  Service: Endoscopy;  Laterality: N/A;  10:30  . excison of rt breast cyst    . LAPAROSCOPIC SALPINGO OOPHERECTOMY Right 04/22/2012   Procedure:  LAPAROSCOPIC SALPINGO OOPHORECTOMY;  Surgeon: Jonnie Kind, MD;  Location: [facility] ORS;  Service: Gynecology;  Laterality: Right;  end 11:17  . MASS EXCISION N/A 04/22/2012   Procedure: EXCISION SKIN TAGS NECK AND HEAD;  Surgeon: Jonnie Kind, MD;  Location: [facility] ORS;  Service: Gynecology;  Laterality: N/A;  start 11:19  . PARTIAL HYSTERECTOMY    . POLYPECTOMY  08/24/2016   Procedure: POLYPECTOMY;  Surgeon: Rogene Houston, MD;  Location: [facility];  Service: Endoscopy;;  colon  . rt,. neck biopsy      Social History   Socioeconomic History  . Marital status: Married    Spouse name: Not on file  . Number of children: 1  . Years of education: Not on file  . Highest education level: 11th grade  Occupational History  . Occupation: disabled     Fish farm manager: UNEMPLOYED  Social Needs  . Financial resource strain: Not very hard  . Food insecurity:    Worry: Never true    Inability: Never true  . Transportation needs:    Medical: No    Non-medical: No  Tobacco Use  . Smoking status: Former Smoker    Packs/day: 0.25    Years: 12.00    Pack years: 3.00    Last attempt to quit: 07/16/1978    Years since quitting: 39.7  .  Smokeless tobacco: Never Used  Substance and Sexual Activity  . Alcohol use: No  . Drug use: No  . Sexual activity: Yes    Birth control/protection: Surgical  Lifestyle  . Physical activity:    Days per week: 0 days    Minutes per session: 0 min  . Stress: Not at all  Relationships  . Social connections:    Talks on phone: Twice a week    Gets together: Once a week    Attends religious service: More than 4 times per year    Active member of club or organization: No    Attends meetings of clubs or organizations: Never    Relationship status: Married  . Intimate partner violence:    Fear of current or ex partner: No    Emotionally abused: No    Physically abused: No    Forced sexual activity: No  Other Topics Concern  . Not on file  Social History  Narrative  . Not on file    Current Outpatient Medications on File Prior to Visit  Medication Sig Dispense Refill  . allopurinol (ZYLOPRIM) 300 MG tablet Take 1 tablet (300 mg total) by mouth daily. 30 tablet 5  . aspirin 325 MG tablet Take 1 tablet (325 mg total) by mouth daily.    . calcitRIOL (ROCALTROL) 0.25 MCG capsule Take 0.25 mcg by mouth 3 (three) times a week. Mondays, Wednesdays,Fridays    . cyclobenzaprine (FLEXERIL) 10 MG tablet Take 10 mg by mouth 3 (three) times daily as needed for muscle spasms.     Marland Kitchen DILT-XR 180 MG 24 hr capsule TAKE 1 CAPSULE BY MOUTH ONCE DAILY 90 capsule 1  . docusate sodium (COLACE) 100 MG capsule Take 100 mg by mouth 2 (two) times daily. Constipation    . DULoxetine (CYMBALTA) 30 MG capsule Take 30 mg by mouth daily.    Marland Kitchen ezetimibe (ZETIA) 10 MG tablet Take 1 tablet (10 mg total) by mouth daily. 90 tablet 3  . gabapentin (NEURONTIN) 300 MG capsule Take 300 mg by mouth 3 (three) times daily.     Marland Kitchen HYDROcodone-acetaminophen (NORCO/VICODIN) 5-325 MG tablet One tablet every six hours for pain.  Limit 7 days. 28 tablet 0  . insulin regular (NOVOLIN R RELION) 100 units/mL injection Inject 0.8 mLs (80 Units total) into the skin daily with supper. 30 mL 11  . metolazone (ZAROXOLYN) 5 MG tablet TAKE 1 TABLET BY MOUTH ONCE DAILY 90 tablet 1  . metoprolol tartrate (LOPRESSOR) 100 MG tablet TAKE 1 TABLET BY MOUTH TWICE DAILY 180 tablet 2  . midodrine (PROAMATINE) 5 MG tablet Take 5 mg by mouth 2 (two) times daily.     . ONE TOUCH ULTRA TEST test strip USE 1 STRIP TO CHECK GLUCOSE TWICE DAILY 100 each 4  . ONETOUCH DELICA LANCETS 67J MISC 1 Device by Does not apply route 2 (two) times daily. 60 each 11  . potassium chloride (K-DUR) 10 MEQ tablet TAKE 3 TABLETS BY MOUTH THREE TIMES DAILY 270 tablet 0  . pravastatin (PRAVACHOL) 20 MG tablet TAKE 1 TABLET BY MOUTH ONCE DAILY BEFORE BREAKFAST 90 tablet 0  . RELION INSULIN SYR 1CC/30G 30G X 5/16" 1 ML MISC USE ONE SYRINGE  TO INJECT INSULIN SUBCUTANEOUSLY THREE TIMES DAILY AS DIRECTED 150 each 0  . RELION INSULIN SYRINGE 31G X 15/64" 1 ML MISC USE 1 SYRINGE THREE TIMES DAILY AS DIRECTED 150 each 0  . spironolactone (ALDACTONE) 25 MG tablet TAKE 1 TABLET BY MOUTH  ONCE DAILY 90 tablet 1  . tiZANidine (ZANAFLEX) 4 MG tablet One by mouth every night before bed as needed for spasm 30 tablet 3  . torsemide (DEMADEX) 20 MG tablet Take 80 mg by mouth daily.      No current facility-administered medications on file prior to visit.     Allergies  Allergen Reactions  . Benazepril Swelling  . Metronidazole Hives  . Mobic [Meloxicam] Hives  . Penicillins Hives and Swelling    Has patient had a PCN reaction causing immediate rash, facial/tongue/throat swelling, SOB or lightheadedness with hypotension: No Has patient had a PCN reaction causing severe rash involving mucus membranes or skin necrosis: Yes Has patient had a PCN reaction that required hospitalization: No Has patient had a PCN reaction occurring within the last 10 years: No If all of the above answers are "NO", then may proceed with Cephalosporin use.   . Sulfonamide Derivatives Hives    Family History  Problem Relation Age of Onset  . Hypertension Brother   . Gout Brother   . Prostate cancer Brother   . Hypertension Brother   . Prostate cancer Brother   . Gout Brother   . Hypertension Sister   . Gout Sister   . Cancer Sister 31       pancreatic   . Leukemia Sister 34  . Gout Sister   . Prostate cancer Brother   . Diabetes Neg Hx     BP 126/60   Pulse 60   Temp 98.6 F (37 C)   Ht 5\' 3"  (1.6 m)   Wt 201 lb (91.2 kg)   SpO2 94%   BMI 35.61 kg/m    Review of Systems She denies hypoglycemia    Objective:   Physical Exam VITAL SIGNS:  See vs page GENERAL: no distress Pulses: dorsalis pedis intact bilat.   MSK: no deformity of the feet CV: 2+ bilat leg edema Skin:  no ulcer on the feet.  normal color and temp on the feet. Neuro:  sensation is intact to touch on the feet.     Lab Results  Component Value Date   HGBA1C 8.2 (A) 04/22/2018   Lab Results  Component Value Date   CREATININE 2.39 (H) 09/07/2017   BUN 86 (HH) 09/07/2017   NA 139 09/07/2017   K 4.1 09/07/2017   CL 96 09/07/2017   CO2 22 09/07/2017       Assessment & Plan:  Insulin-requiring type 2 DM, with PN: she needs increased rx.  Renal insuff: in this context, she needs reg for her PM insulin.   Patient Instructions  check your blood sugar twice a day.  vary the time of day when you check, between before the 3 meals, and at bedtime.  also check if you have symptoms of your blood sugar being too high or too low.  please keep a record of the readings and bring it to your next appointment here.  You can write it on any piece of paper.  please call us sooner if your blood sugar goes below 70, or if you have a lot of readings over 200.   Please increase the 70/30 insulin to 200 units with breakfast.  However, take just 90 units on Sunday morning, and:  Please continue the same regular insulin: 80 units with supper.   On this type of insulin schedule, you should eat meals on a regular schedule.  If a meal is missed or significantly delayed, your blood sugar could  go low.   Please come back for a follow-up appointment in 2 months.

## 2018-04-22 NOTE — Patient Instructions (Addendum)
check your blood sugar twice a day.  vary the time of day when you check, between before the 3 meals, and at bedtime.  also check if you have symptoms of your blood sugar being too high or too low.  please keep a record of the readings and bring it to your next appointment here.  You can write it on any piece of paper.  please call us sooner if your blood sugar goes below 70, or if you have a lot of readings over 200.   Please increase the 70/30 insulin to 200 units with breakfast.  However, take just 90 units on Sunday morning, and:  Please continue the same regular insulin: 80 units with supper.   On this type of insulin schedule, you should eat meals on a regular schedule.  If a meal is missed or significantly delayed, your blood sugar could go low.   Please come back for a follow-up appointment in 2 months.

## 2018-04-24 ENCOUNTER — Telehealth (HOSPITAL_COMMUNITY): Payer: Self-pay | Admitting: Physical Therapy

## 2018-04-24 NOTE — Telephone Encounter (Signed)
Therapist called to check in on the patient and see if she wanted to schedule a future appointment for when the clinic reopens or if she wants to be discharged, however there was no answer and was not able to leave a voicemail.   Clarene Critchley PT, DPT 10:20 AM, 04/24/18 [phone]

## 2018-04-25 DIAGNOSIS — R809 Proteinuria, unspecified: Secondary | ICD-10-CM | POA: Diagnosis not present

## 2018-04-25 DIAGNOSIS — Z79899 Other long term (current) drug therapy: Secondary | ICD-10-CM | POA: Diagnosis not present

## 2018-04-25 DIAGNOSIS — E559 Vitamin D deficiency, unspecified: Secondary | ICD-10-CM | POA: Diagnosis not present

## 2018-04-25 DIAGNOSIS — I10 Essential (primary) hypertension: Secondary | ICD-10-CM | POA: Diagnosis not present

## 2018-04-25 DIAGNOSIS — D649 Anemia, unspecified: Secondary | ICD-10-CM | POA: Diagnosis not present

## 2018-04-25 DIAGNOSIS — N183 Chronic kidney disease, stage 3 (moderate): Secondary | ICD-10-CM | POA: Diagnosis not present

## 2018-04-29 DIAGNOSIS — E876 Hypokalemia: Secondary | ICD-10-CM | POA: Diagnosis not present

## 2018-04-29 DIAGNOSIS — I509 Heart failure, unspecified: Secondary | ICD-10-CM | POA: Diagnosis not present

## 2018-04-29 DIAGNOSIS — N184 Chronic kidney disease, stage 4 (severe): Secondary | ICD-10-CM | POA: Diagnosis not present

## 2018-04-29 DIAGNOSIS — E559 Vitamin D deficiency, unspecified: Secondary | ICD-10-CM | POA: Diagnosis not present

## 2018-05-06 ENCOUNTER — Encounter: Payer: Self-pay | Admitting: Orthopaedic Surgery

## 2018-05-06 ENCOUNTER — Other Ambulatory Visit: Payer: Self-pay

## 2018-05-06 ENCOUNTER — Ambulatory Visit: Payer: PPO | Admitting: Orthopaedic Surgery

## 2018-05-07 ENCOUNTER — Ambulatory Visit: Payer: PPO | Admitting: Orthopaedic Surgery

## 2018-05-07 ENCOUNTER — Ambulatory Visit (INDEPENDENT_AMBULATORY_CARE_PROVIDER_SITE_OTHER): Payer: PPO | Admitting: Orthopaedic Surgery

## 2018-05-07 ENCOUNTER — Encounter: Payer: Self-pay | Admitting: Orthopaedic Surgery

## 2018-05-07 ENCOUNTER — Other Ambulatory Visit: Payer: Self-pay

## 2018-05-07 DIAGNOSIS — E0849 Diabetes mellitus due to underlying condition with other diabetic neurological complication: Secondary | ICD-10-CM

## 2018-05-07 DIAGNOSIS — G8929 Other chronic pain: Secondary | ICD-10-CM | POA: Diagnosis not present

## 2018-05-07 DIAGNOSIS — M5442 Lumbago with sciatica, left side: Secondary | ICD-10-CM | POA: Diagnosis not present

## 2018-05-07 MED ORDER — HYDROCODONE-ACETAMINOPHEN 5-325 MG PO TABS: ORAL_TABLET | ORAL | 0 refills | Status: DC

## 2018-05-07 NOTE — Progress Notes (Signed)
Virtual Visit via Telephone Note  I connected with Krista Strickland on 05/07/18 at 10:00 AM EDT by telephone and verified that I am speaking with the correct person using two identifiers.   I discussed the limitations, risks, security and privacy concerns of performing an evaluation and management service by telephone and the availability of in person appointments. I also discussed with the patient that there may be a patient responsible charge related to this service. The patient expressed understanding and agreed to proceed.   History of Present Illness: She has long standing lower back pain with left sided sciatica.  She is doing fairly well with this.  She has good and bad days. Her diabetes A1C has dropped to 8.7 which is an improvement for her.  She is watching her diet. She has no new trauma. She is being active.  She has no new trauma.  She has no weakness.   Observations/Objective: Per above  Assessment and Plan: Encounter Diagnoses  Name Primary?  . Chronic left-sided low back pain with left-sided sciatica Yes  . 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  I will call in pain medicine.  I have reviewed the [facility] web site prior to prescribing narcotic medicine for this patient.      I discussed the assessment and treatment plan with the patient. The patient was provided an opportunity to ask questions and all were answered. The patient agreed with the plan and demonstrated an understanding of the instructions.   The patient was advised to call back or seek an in-person evaluation if the symptoms worsen or if the condition fails to improve as anticipated.  I provided 7 minutes of non-face-to-face time during this encounter.   Sanjuana Kava, MD

## 2018-05-08 ENCOUNTER — Other Ambulatory Visit: Payer: Self-pay | Admitting: Family Medicine

## 2018-05-12 ENCOUNTER — Telehealth (HOSPITAL_COMMUNITY): Payer: Self-pay

## 2018-05-12 NOTE — Telephone Encounter (Signed)
Weekly phone call made to pt to check-in on her HEP and her current status. PT inquired how her back pain was doing and if she wanted to be rescheduled once our clinic reopens or be d/c. She stated that her pain was actually a little worse so did not d/c at this time. Pt also stating that she was trying to do her current HEP but no changes to the current program were desired at this time. Pt agreeable to weekly phone calls to check in on her status/progress. PT educated pt that she could call our clinic with any questions or concerns but otherwise, we would check in with her again next week.  Geraldine Solar PT, DPT

## 2018-05-19 ENCOUNTER — Telehealth (HOSPITAL_COMMUNITY): Payer: Self-pay

## 2018-05-19 NOTE — Telephone Encounter (Signed)
Called pt regarding weekly check-in but no answer so PT left voicemail/message for pt. Educated her that if she had any questions or concerns she could call our office back, but otherwise, if she was pleased with current HEP, etc. Then no need to return our call and we would call again next week. Left front office number for pt convenience.  Geraldine Solar PT, DPT

## 2018-05-29 ENCOUNTER — Encounter: Payer: Self-pay | Admitting: Family Medicine

## 2018-05-29 ENCOUNTER — Other Ambulatory Visit: Payer: Self-pay

## 2018-05-29 ENCOUNTER — Ambulatory Visit (INDEPENDENT_AMBULATORY_CARE_PROVIDER_SITE_OTHER): Payer: PPO | Admitting: Family Medicine

## 2018-05-29 DIAGNOSIS — E785 Hyperlipidemia, unspecified: Secondary | ICD-10-CM | POA: Diagnosis not present

## 2018-05-29 DIAGNOSIS — I10 Essential (primary) hypertension: Secondary | ICD-10-CM

## 2018-05-29 DIAGNOSIS — E1165 Type 2 diabetes mellitus with hyperglycemia: Secondary | ICD-10-CM

## 2018-05-29 DIAGNOSIS — Z794 Long term (current) use of insulin: Secondary | ICD-10-CM | POA: Diagnosis not present

## 2018-05-29 NOTE — Patient Instructions (Signed)
Thank you for completing your visit via telemedicine today. I appreciate the opportunity to provide you with the care for your health and wellness. Today we discussed: Overall health and wellness.  Glad to hear that you are doing good today.  And that you are taking your medications as directed.  Please continue to make sure that you are eating a heart healthy, low-cholesterol, low-fat, diabetic diet.  I have attached some information for you to review regarding diet so that you can make sure you are making good food choices.  In regards to your diabetes we talked about your blood sugar your morning blood sugars in the 90 range, that is excellent.  Your blood sugars in the 200 range in evening is a little bit higher than what we would want.  Please continue to make sure you monitor what you eat in the latter half of the day.  We look forward to seeing you back in the office in the coming months.  Until then please continue to practice social distancing to help keep you and our community safe.  [place] YOUR HANDS WELL AND FREQUENTLY. AVOID TOUCHING YOUR FACE, UNLESS YOUR HANDS ARE FRESHLY WASHED.  GET FRESH AIR DAILY. STAY HYDRATED WITH WATER.   It was a pleasure to see you and I look forward to continuing to work together on your health and well-being. Please do not hesitate to call the office if you need care or have questions about your care.  Have a wonderful day and week. With Gratitude, Cherly Beach, DNP, AGNP-BC   Diabetes Mellitus and Nutrition, Adult When you have diabetes (diabetes mellitus), it is very important to have healthy eating habits because your blood sugar (glucose) levels are greatly affected by what you eat and drink. Eating healthy foods in the appropriate amounts, at about the same times every day, can help you:  Control your blood glucose.  Lower your risk of heart disease.  Improve your blood pressure.  Reach or maintain a healthy weight. Every person  with diabetes is different, and each person has different needs for a meal plan. Your health care provider may recommend that you work with a diet and nutrition specialist (dietitian) to make a meal plan that is best for you. Your meal plan may vary depending on factors such as:  The calories you need.  The medicines you take.  Your weight.  Your blood glucose, blood pressure, and cholesterol levels.  Your activity level.  Other health conditions you have, such as heart or kidney disease. How do carbohydrates affect me? Carbohydrates, also called carbs, affect your blood glucose level more than any other type of food. Eating carbs naturally raises the amount of glucose in your blood. Carb counting is a method for keeping track of how many carbs you eat. Counting carbs is important to keep your blood glucose at a healthy level, especially if you use insulin or take certain oral diabetes medicines. It is important to know how many carbs you can safely have in each meal. This is different for every person. Your dietitian can help you calculate how many carbs you should have at each meal and for each snack. Foods that contain carbs include:  Bread, cereal, rice, pasta, and crackers.  Potatoes and corn.  Peas, beans, and lentils.  Milk and yogurt.  Fruit and juice.  Desserts, such as cakes, cookies, ice cream, and candy. How does alcohol affect me? Alcohol can cause a sudden decrease in blood glucose (hypoglycemia),  especially if you use insulin or take certain oral diabetes medicines. Hypoglycemia can be a life-threatening condition. Symptoms of hypoglycemia (sleepiness, dizziness, and confusion) are similar to symptoms of having too much alcohol. If your health care provider says that alcohol is safe for you, follow these guidelines:  Limit alcohol intake to no more than 1 drink per day for nonpregnant women and 2 drinks per day for men. One drink equals 12 oz of beer, 5 oz of wine, or  1 oz of hard liquor.  Do not drink on an empty stomach.  Keep yourself hydrated with water, diet soda, or unsweetened iced tea.  Keep in mind that regular soda, juice, and other mixers may contain a lot of sugar and must be counted as carbs. What are tips for following this plan?  Reading food labels  Start by checking the serving size on the "Nutrition Facts" label of packaged foods and drinks. The amount of calories, carbs, fats, and other nutrients listed on the label is based on one serving of the item. Many items contain more than one serving per package.  Check the total grams (g) of carbs in one serving. You can calculate the number of servings of carbs in one serving by dividing the total carbs by 15. For example, if a food has 30 g of total carbs, it would be equal to 2 servings of carbs.  Check the number of grams (g) of saturated and trans fats in one serving. Choose foods that have low or no amount of these fats.  Check the number of milligrams (mg) of salt (sodium) in one serving. Most people should limit total sodium intake to less than 2,300 mg per day.  Always check the nutrition information of foods labeled as "low-fat" or "nonfat". These foods may be higher in added sugar or refined carbs and should be avoided.  Talk to your dietitian to identify your daily goals for nutrients listed on the label. Shopping  Avoid buying canned, premade, or processed foods. These foods tend to be high in fat, sodium, and added sugar.  Shop around the outside edge of the grocery store. This includes fresh fruits and vegetables, bulk grains, fresh meats, and fresh dairy. Cooking  Use low-heat cooking methods, such as baking, instead of high-heat cooking methods like deep frying.  Cook using healthy oils, such as olive, canola, or sunflower oil.  Avoid cooking with butter, cream, or high-fat meats. Meal planning  Eat meals and snacks regularly, preferably at the same times every  day. Avoid going long periods of time without eating.  Eat foods high in fiber, such as fresh fruits, vegetables, beans, and whole grains. Talk to your dietitian about how many servings of carbs you can eat at each meal.  Eat 4-6 ounces (oz) of lean protein each day, such as lean meat, chicken, fish, eggs, or tofu. One oz of lean protein is equal to: ? 1 oz of meat, chicken, or fish. ? 1 egg. ?  cup of tofu.  Eat some foods each day that contain healthy fats, such as avocado, nuts, seeds, and fish. Lifestyle  Check your blood glucose regularly.  Exercise regularly as told by your health care provider. This may include: ? 150 minutes of moderate-intensity or vigorous-intensity exercise each week. This could be brisk walking, biking, or water aerobics. ? Stretching and doing strength exercises, such as yoga or weightlifting, at least 2 times a week.  Take medicines as told by your health care provider.  Do not use any products that contain nicotine or tobacco, such as cigarettes and e-cigarettes. If you need help quitting, ask your health care provider.  Work with a Social worker or diabetes educator to identify strategies to manage stress and any emotional and social challenges. Questions to ask a health care provider  Do I need to meet with a diabetes educator?  Do I need to meet with a dietitian?  What number can I call if I have questions?  When are the best times to check my blood glucose? Where to find more information:  American Diabetes Association: diabetes.org  Academy of Nutrition and Dietetics: www.eatright.[employer] of Diabetes and Digestive and Kidney Diseases (NIH): [employer] Summary  A healthy meal plan will help you control your blood glucose and maintain a healthy lifestyle.  Working with a diet and nutrition specialist (dietitian) can help you make a meal plan that is best for you.  Keep in mind that carbohydrates (carbs) and alcohol  have immediate effects on your blood glucose levels. It is important to count carbs and to use alcohol carefully. This information is not intended to replace advice given to you by your health care provider. Make sure you discuss any questions you have with your health care provider. Document Released: 10/12/2004 Document Revised: 08/15/2016 Document Reviewed: 02/20/2016 Elsevier Interactive Patient Education  2019 [place] DASH stands for "Dietary Approaches to Stop Hypertension." The DASH eating plan is a healthy eating plan that has been shown to reduce high blood pressure (hypertension). It may also reduce your risk for type 2 diabetes, heart disease, and stroke. The DASH eating plan may also help with weight loss. What are tips for following this plan?  General guidelines  Avoid eating more than 2,300 mg (milligrams) of salt (sodium) a day. If you have hypertension, you may need to reduce your sodium intake to 1,500 mg a day.  Limit alcohol intake to no more than 1 drink a day for nonpregnant women and 2 drinks a day for men. One drink equals 12 oz of beer, 5 oz of wine, or 1 oz of hard liquor.  Work with your health care provider to maintain a healthy body weight or to lose weight. Ask what an ideal weight is for you.  Get at least 30 minutes of exercise that causes your heart to beat faster (aerobic exercise) most days of the week. Activities may include walking, swimming, or biking.  Work with your health care provider or diet and nutrition specialist (dietitian) to adjust your eating plan to your individual calorie needs. Reading food labels   Check food labels for the amount of sodium per serving. Choose foods with less than 5 percent of the Daily Value of sodium. Generally, foods with less than 300 mg of sodium per serving fit into this eating plan.  To find whole grains, look for the word "whole" as the first word in the ingredient list. Shopping   Buy products labeled as "low-sodium" or "no salt added."  Buy fresh foods. Avoid canned foods and premade or frozen meals. Cooking  Avoid adding salt when cooking. Use salt-free seasonings or herbs instead of table salt or sea salt. Check with your health care provider or pharmacist before using salt substitutes.  Do not fry foods. Cook foods using healthy methods such as baking, boiling, grilling, and broiling instead.  Cook with heart-healthy oils, such as olive, canola, soybean, or sunflower oil. Meal planning  Eat  a balanced diet that includes: ? 5 or more servings of fruits and vegetables each day. At each meal, try to fill half of your plate with fruits and vegetables. ? Up to 6-8 servings of whole grains each day. ? Less than 6 oz of lean meat, poultry, or fish each day. A 3-oz serving of meat is about the same size as a deck of cards. One egg equals 1 oz. ? 2 servings of low-fat dairy each day. ? A serving of nuts, seeds, or beans 5 times each week. ? Heart-healthy fats. Healthy fats called Omega-3 fatty acids are found in foods such as flaxseeds and coldwater fish, like sardines, salmon, and mackerel.  Limit how much you eat of the following: ? Canned or prepackaged foods. ? Food that is high in trans fat, such as fried foods. ? Food that is high in saturated fat, such as fatty meat. ? Sweets, desserts, sugary drinks, and other foods with added sugar. ? Full-fat dairy products.  Do not salt foods before eating.  Try to eat at least 2 vegetarian meals each week.  Eat more home-cooked food and less restaurant, buffet, and fast food.  When eating at a restaurant, ask that your food be prepared with less salt or no salt, if possible. What foods are recommended? The items listed may not be a complete list. Talk with your dietitian about what dietary choices are best for you. Grains Whole-grain or whole-wheat bread. Whole-grain or whole-wheat pasta. Brown rice. [place]. Bulgur. Whole-grain and low-sodium cereals. Pita bread. Low-fat, low-sodium crackers. Whole-wheat flour tortillas. Vegetables Fresh or frozen vegetables (raw, steamed, roasted, or grilled). Low-sodium or reduced-sodium tomato and vegetable juice. Low-sodium or reduced-sodium tomato sauce and tomato paste. Low-sodium or reduced-sodium canned vegetables. Fruits All fresh, dried, or frozen fruit. Canned fruit in natural juice (without added sugar). Meat and other protein foods Skinless chicken or [place]. Ground chicken or [place]. Pork with fat trimmed off. Fish and seafood. Egg whites. Dried beans, peas, or lentils. Unsalted nuts, nut butters, and seeds. Unsalted canned beans. Lean cuts of beef with fat trimmed off. Low-sodium, lean deli meat. Dairy Low-fat (1%) or fat-free (skim) milk. Fat-free, low-fat, or reduced-fat cheeses. Nonfat, low-sodium ricotta or cottage cheese. Low-fat or nonfat yogurt. Low-fat, low-sodium cheese. Fats and oils Soft margarine without trans fats. Vegetable oil. Low-fat, reduced-fat, or light mayonnaise and salad dressings (reduced-sodium). Canola, safflower, olive, soybean, and sunflower oils. Avocado. Seasoning and other foods Herbs. Spices. Seasoning mixes without salt. Unsalted popcorn and pretzels. Fat-free sweets. What foods are not recommended? The items listed may not be a complete list. Talk with your dietitian about what dietary choices are best for you. Grains Baked goods made with fat, such as croissants, muffins, or some breads. Dry pasta or rice meal packs. Vegetables Creamed or fried vegetables. Vegetables in a cheese sauce. Regular canned vegetables (not low-sodium or reduced-sodium). Regular canned tomato sauce and paste (not low-sodium or reduced-sodium). Regular tomato and vegetable juice (not low-sodium or reduced-sodium). Angie Fava. Olives. Fruits Canned fruit in a light or heavy syrup. Fried fruit. Fruit in cream or butter sauce. Meat and  other protein foods Fatty cuts of meat. Ribs. Fried meat. Berniece Salines. Sausage. Bologna and other processed lunch meats. Salami. Fatback. Hotdogs. Bratwurst. Salted nuts and seeds. Canned beans with added salt. Canned or smoked fish. Whole eggs or egg yolks. Chicken or [place] with skin. Dairy Whole or 2% milk, cream, and half-and-half. Whole or full-fat cream cheese. Whole-fat or sweetened yogurt. Full-fat  cheese. Nondairy creamers. Whipped toppings. Processed cheese and cheese spreads. Fats and oils Butter. Stick margarine. Lard. Shortening. Ghee. Bacon fat. Tropical oils, such as coconut, palm kernel, or palm oil. Seasoning and other foods Salted popcorn and pretzels. Onion salt, garlic salt, seasoned salt, table salt, and sea salt. Worcestershire sauce. Tartar sauce. Barbecue sauce. Teriyaki sauce. Soy sauce, including reduced-sodium. Steak sauce. Canned and packaged gravies. Fish sauce. Oyster sauce. Cocktail sauce. Horseradish that you find on the shelf. Ketchup. Mustard. Meat flavorings and tenderizers. Bouillon cubes. Hot sauce and Tabasco sauce. Premade or packaged marinades. Premade or packaged taco seasonings. Relishes. Regular salad dressings. Where to find more information:  National Heart, Lung, and [place]: https://wilson-eaton.com/  American Heart Association: www.heart.org Summary  The DASH eating plan is a healthy eating plan that has been shown to reduce high blood pressure (hypertension). It may also reduce your risk for type 2 diabetes, heart disease, and stroke.  With the DASH eating plan, you should limit salt (sodium) intake to 2,300 mg a day. If you have hypertension, you may need to reduce your sodium intake to 1,500 mg a day.  When on the DASH eating plan, aim to eat more fresh fruits and vegetables, whole grains, lean proteins, low-fat dairy, and heart-healthy fats.  Work with your health care provider or diet and nutrition specialist (dietitian) to adjust your eating plan  to your individual calorie needs. This information is not intended to replace advice given to you by your health care provider. Make sure you discuss any questions you have with your health care provider. Document Released: 01/04/2011 Document Revised: 01/09/2016 Document Reviewed: 01/09/2016 Elsevier Interactive Patient Education  2019 [employer].

## 2018-05-29 NOTE — Progress Notes (Signed)
Virtual Visit via Telephone Note   This visit type was conducted due to national recommendations for restrictions regarding the COVID-19 Pandemic (e.g. social distancing) in an effort to limit this patient's exposure and mitigate transmission in our community.  Due to her co-morbid illnesses, this patient is at least at moderate risk for complications without adequate follow up.  This format is felt to be most appropriate for this patient at this time.  The patient did not have access to video technology/had technical difficulties with video requiring transitioning to audio format only (telephone).  All issues noted in this document were discussed and addressed.  No physical exam could be performed with this format.    Evaluation Performed:  Follow-up visit  Date:  05/29/2018   ID:  Krista, Strickland [birth year], MRN [id number]  Patient Location: Home Provider Location: Other:  Telemedicine  Location of Patient: Home Location of Provider: Telehealth Consent was obtain for visit to be over via telehealth. I verified that I am speaking with the correct person using two identifiers.  PCP:  Fayrene Helper, MD   Chief Complaint:  Follow Up   History of Present Illness:    Krista Strickland is a 68 y.o. female patient of Dr. Griffin Dakin, who is well-known to the clinic.  Who has a history of hypertension, hyperlipidemia, diabetes among others.  Who presents today for follow-up regarding these.  Reports that she is taking her medications as directed and without issue.  Is not having any side effects of them.  Hypertension: Reports that she is taking her medications and does not have any side effects of having elevated blood sugar.  Denies having vision changes, headaches, chest pain, palpitations.  DM: average blood sugar in the morning 90-95. Evening it is up in the 200 range.  She reports that she does watch what she eats.  Reports that she takes her medications as directed.  And  does not have any signs of hypoglycemia.  Denies having polyphagia, polyuria, polydipsia.  Most recent A1c was 8.2% in March 2020, improved from 9% from jan 2019.  Hyperlipidemia: Reports taking her medications without issues.  Reports that she watches her diet.  Unsure her weight at this time.  She does report that she does eat some foods that are higher in fats.  Than she should.  Reports that her kidney doctor drew her lipids not long ago.  Reported that they were doing good.  Do not see these in the chart for review.  We will look to get them.  The patient does not have symptoms concerning for COVID-19 infection (fever, chills, cough, or new shortness of breath).   Past Medical, Surgical, Social History, Allergies, and Medications have been Reviewed.   Past Medical History:  Diagnosis Date  . Arthritis   . Cancer ([place])    uterine  . Chronic kidney disease   . Coronary artery disease   . Diabetes mellitus, type 2 ([place])   . GERD (gastroesophageal reflux disease)    no medication in 2017  . Gout   . History of MRSA infection 03/2009   pt denies this  . Hypercalcemia 2017   managed by nephrology  . Hyperlipidemia   . Hypertension   . Obesity   . Psoriasis   . Stroke [facility]) 2013   "light"   Past Surgical History:  Procedure Laterality Date  . ABDOMINAL HYSTERECTOMY    . APPENDECTOMY  2012  . COLONOSCOPY WITH PROPOFOL N/A  08/24/2016   Procedure: COLONOSCOPY WITH PROPOFOL;  Surgeon: Rogene Houston, MD;  Location: [facility];  Service: Endoscopy;  Laterality: N/A;  10:30  . excison of rt breast cyst    . LAPAROSCOPIC SALPINGO OOPHERECTOMY Right 04/22/2012   Procedure: LAPAROSCOPIC SALPINGO OOPHORECTOMY;  Surgeon: Jonnie Kind, MD;  Location: [facility] ORS;  Service: Gynecology;  Laterality: Right;  end 11:17  . MASS EXCISION N/A 04/22/2012   Procedure: EXCISION SKIN TAGS NECK AND HEAD;  Surgeon: Jonnie Kind, MD;  Location: [facility] OR[facility];  Service: Gynecology;  Laterality: N/A;  start  11:19  . PARTIAL HYSTERECTOMY    . POLYPECTOMY  08/24/2016   Procedure: POLYPECTOMY;  Surgeon: Rogene Houston, MD;  Location: [facility];  Service: Endoscopy;;  colon  . rt,. neck biopsy       Current Meds  Medication Sig  . allopurinol (ZYLOPRIM) 300 MG tablet Take 1 tablet (300 mg total) by mouth daily.  Marland Kitchen aspirin 325 MG tablet Take 1 tablet (325 mg total) by mouth daily.  . calcitRIOL (ROCALTROL) 0.25 MCG capsule Take 0.25 mcg by mouth 3 (three) times a week. Mondays, Wednesdays,Fridays  . cyclobenzaprine (FLEXERIL) 10 MG tablet Take 10 mg by mouth 3 (three) times daily as needed for muscle spasms.   Marland Kitchen DILT-XR 180 MG 24 hr capsule TAKE 1 CAPSULE BY MOUTH ONCE DAILY  . docusate sodium (COLACE) 100 MG capsule Take 100 mg by mouth 2 (two) times daily. Constipation  . DULoxetine (CYMBALTA) 30 MG capsule Take 30 mg by mouth daily.  Marland Kitchen ezetimibe (ZETIA) 10 MG tablet Take 1 tablet (10 mg total) by mouth daily.  Marland Kitchen gabapentin (NEURONTIN) 400 MG capsule Take 400 mg by mouth 3 (three) times daily.  Marland Kitchen HYDROcodone-acetaminophen (NORCO/VICODIN) 5-325 MG tablet One tablet every six hours for pain.  Limit 7 days.  . insulin NPH-regular Human (NOVOLIN 70/30 RELION) (70-30) 100 UNIT/ML injection Inject 200 Units into the skin daily with breakfast.  . insulin regular (NOVOLIN R RELION) 100 units/mL injection Inject 0.8 mLs (80 Units total) into the skin daily with supper.  . metolazone (ZAROXOLYN) 5 MG tablet TAKE 1 TABLET BY MOUTH ONCE DAILY  . metoprolol tartrate (LOPRESSOR) 100 MG tablet TAKE 1 TABLET BY MOUTH TWICE DAILY  . midodrine (PROAMATINE) 5 MG tablet Take 5 mg by mouth 2 (two) times daily.   . ONE TOUCH ULTRA TEST test strip USE 1 STRIP TO CHECK GLUCOSE TWICE DAILY  . ONETOUCH DELICA LANCETS 81O MISC 1 Device by Does not apply route 2 (two) times daily.  . potassium chloride (K-DUR) 10 MEQ tablet TAKE 3 TABLETS BY MOUTH THREE TIMES DAILY  . pravastatin (PRAVACHOL) 20 MG tablet TAKE 1 TABLET  BY MOUTH ONCE DAILY BEFORE BREAKFAST  . RELION INSULIN SYR 1CC/30G 30G X 5/16" 1 ML MISC USE ONE SYRINGE TO INJECT INSULIN SUBCUTANEOUSLY THREE TIMES DAILY AS DIRECTED  . RELION INSULIN SYRINGE 31G X 15/64" 1 ML MISC USE 1 SYRINGE THREE TIMES DAILY AS DIRECTED  . spironolactone (ALDACTONE) 25 MG tablet TAKE 1 TABLET BY MOUTH ONCE DAILY  . tiZANidine (ZANAFLEX) 4 MG tablet One by mouth every night before bed as needed for spasm  . torsemide (DEMADEX) 20 MG tablet Take 80 mg by mouth daily.      Allergies:   Benazepril; Metronidazole; Mobic [meloxicam]; Penicillins; and Sulfonamide derivatives   Social History   Tobacco Use  . Smoking status: Former Smoker    Packs/day: 0.25    Years:  12.00    Pack years: 3.00    Last attempt to quit: 07/16/1978    Years since quitting: 39.8  . Smokeless tobacco: Never Used  Substance Use Topics  . Alcohol use: No  . Drug use: No     Family Hx: The patient's family history includes Cancer (age of onset: 81) in her sister; Gout in her brother, brother, sister, and sister; Hypertension in her brother, brother, and sister; Leukemia (age of onset: 86) in her sister; Prostate cancer in her brother, brother, and brother. There is no history of Diabetes.  ROS:   Please see the history of present illness.    Review of Systems  Constitutional: Negative for chills and fever.  HENT: Negative.   Eyes: Negative.   Respiratory: Negative for cough, shortness of breath and wheezing.   Cardiovascular: Negative for chest pain, palpitations and leg swelling.  Gastrointestinal: Negative.   Genitourinary: Negative.   Musculoskeletal: Negative.   Neurological: Negative.   Psychiatric/Behavioral: Negative.   All other systems reviewed and are negative.   Labs/Other Tests and Data Reviewed:    Recent Labs: 09/07/2017: BUN 86; Creatinine, Ser 2.39; Hemoglobin 13.0; Platelets 215; Potassium 4.1; Sodium 139 02/12/2018: ALT 69   Recent Lipid Panel Lab Results   Component Value Date/Time   CHOL 134 02/12/2018 02:54 PM   CHOL 117 09/07/2017 09:30 AM   TRIG 257 (H) 02/12/2018 02:54 PM   HDL 30 (L) 02/12/2018 02:54 PM   HDL 24 (L) 09/07/2017 09:30 AM   CHOLHDL 4.5 02/12/2018 02:54 PM   LDLCALC 70 02/12/2018 02:54 PM    Wt Readings from Last 3 Encounters:  04/22/18 201 lb (91.2 kg)  04/16/18 202 lb (91.6 kg)  03/19/18 206 lb (93.4 kg)     Objective:    Vital Signs:  There were no vitals taken for this visit.   Physical Exam Pulmonary:     Effort: Pulmonary effort is normal.  Neurological:     Mental Status: She is alert and oriented to person, place, and time.  Psychiatric:        Mood and Affect: Mood normal.        Behavior: Behavior normal.        Thought Content: Thought content normal.        Judgment: Judgment normal.   Good communication   ASSESSMENT & PLAN:    1. Type 2 diabetes mellitus with hyperglycemia, with long-term current use of insulin (HCC) A1c was improved in March 2020.  Continue to take medication as directed.  Will recheck this in future.  Educated on diabetic diet again.  2. Essential hypertension Reports that she is taking her medications as directed.  Does not have any signs or symptoms of having elevated BP at this time.  Provided with a DASH diet in the AVS. Encouraged to try to see if she can get 30 minutes of walking on 5 days of the week.  For heart health.  3. Hyperlipidemia LDL goal <100 Controlled continue medication at this time.  Reports that she had her labs drawn at her kidney doctor.  And reported that her lipid levels were good.  Do not see these in the chart at this time, for review.   Time:   Today, I have spent 10 minutes with the patient with telehealth technology discussing the above problems.     Medication Adjustments/Labs and Tests Ordered: Current medicines are reviewed at length with the patient today.  Concerns regarding medicines are outlined above.  Tests Ordered: No  orders of the defined types were placed in this encounter.   Medication Changes: No orders of the defined types were placed in this encounter.   Disposition:  Follow up Sept 2020  Signed, Perlie Mayo, NP  05/29/2018 11:22 AM     [place]

## 2018-05-30 ENCOUNTER — Telehealth (HOSPITAL_COMMUNITY): Payer: Self-pay

## 2018-05-30 NOTE — Telephone Encounter (Signed)
I attempted to call Ms. Casebier at her home number and left a voice message. I informed her we were calling to check in with how she is feeling and how her back is doing since we last spoke with her. I provided our front office number and asked that she call us if she has any concerns or questions regarding her current exercise routine.   Kipp Brood, PT, DPT, [facility] Physical Therapist with [facility]  05/30/2018 3:51 PM

## 2018-06-02 ENCOUNTER — Other Ambulatory Visit: Payer: Self-pay | Admitting: Family Medicine

## 2018-06-02 ENCOUNTER — Ambulatory Visit: Payer: PPO | Admitting: Family Medicine

## 2018-06-03 ENCOUNTER — Ambulatory Visit (INDEPENDENT_AMBULATORY_CARE_PROVIDER_SITE_OTHER): Payer: PPO | Admitting: Family Medicine

## 2018-06-03 ENCOUNTER — Encounter: Payer: Self-pay | Admitting: Family Medicine

## 2018-06-03 ENCOUNTER — Other Ambulatory Visit: Payer: Self-pay

## 2018-06-03 VITALS — BP 152/76 | HR 60 | Temp 99.2°F | Resp 14 | Ht 63.0 in | Wt 202.8 lb

## 2018-06-03 DIAGNOSIS — Z0000 Encounter for general adult medical examination without abnormal findings: Secondary | ICD-10-CM

## 2018-06-03 NOTE — Patient Instructions (Signed)
Krista Strickland , Thank you for taking time to come for your Medicare Wellness Visit. I appreciate your ongoing commitment to your health goals. Please review the following plan we discussed and let me know if I can assist you in the future.   Screening recommendations/referrals: Colonoscopy: up to date Mammogram: up to date Bone Density: up to date Recommended yearly ophthalmology/optometry visit for glaucoma screening and checkup Recommended yearly dental visit for hygiene and checkup  Vaccinations: Influenza vaccine: Due Fall 2020 Pneumococcal vaccine: up to date Tdap vaccine: up to date Shingles vaccine: Discuss with Dr Moshe Cipro  Advanced directives: Husband knows   Preventive Care 68 Years and Older, Female Preventive care refers to lifestyle choices and visits with your health care provider that can promote health and wellness. What does preventive care include?  A yearly physical exam. This is also called an annual well check.  Dental exams once or twice a year.  Routine eye exams. Ask your health care provider how often you should have your eyes checked.  Personal lifestyle choices, including:  Daily care of your teeth and gums.  Regular physical activity.  Eating a healthy diet.  Avoiding tobacco and drug use.  Limiting alcohol use.  Practicing safe sex.  Taking low-dose aspirin every day.  Taking vitamin and mineral supplements as recommended by your health care provider. What happens during an annual well check? The services and screenings done by your health care provider during your annual well check will depend on your age, overall health, lifestyle risk factors, and family history of disease. Counseling  Your health care provider may ask you questions about your:  Alcohol use.  Tobacco use.  Drug use.  Emotional well-being.  Home and relationship well-being.  Sexual activity.  Eating habits.  History of falls.  Memory and ability to  understand (cognition).  Work and work Statistician.  Reproductive health. Screening  You may have the following tests or measurements:  Height, weight, and BMI.  Blood pressure.  Lipid and cholesterol levels. These may be checked every 5 years, or more frequently if you are over 68 years old.  Skin check.  Lung cancer screening. You may have this screening every year starting at age 55 if you have a 30-pack-year history of smoking and currently smoke or have quit within the past 15 years.  Fecal occult blood test (FOBT) of the stool. You may have this test every year starting at age 68.  Flexible sigmoidoscopy or colonoscopy. You may have a sigmoidoscopy every 5 years or a colonoscopy every 10 years starting at age 68.  Hepatitis C blood test.  Hepatitis B blood test.  Sexually transmitted disease (STD) testing.  Diabetes screening. This is done by checking your blood sugar (glucose) after you have not eaten for a while (fasting). You may have this done every 1-3 years.  Bone density scan. This is done to screen for osteoporosis. You may have this done starting at age 68.  Mammogram. This may be done every 1-2 years. Talk to your health care provider about how often you should have regular mammograms. Talk with your health care provider about your test results, treatment options, and if necessary, the need for more tests. Vaccines  Your health care provider may recommend certain vaccines, such as:  Influenza vaccine. This is recommended every year.  Tetanus, diphtheria, and acellular pertussis (Tdap, Td) vaccine. You may need a Td booster every 10 years.  Zoster vaccine. You may need this after age 12.  Pneumococcal 13-valent conjugate (PCV13) vaccine. One dose is recommended after age 33.  Pneumococcal polysaccharide (PPSV23) vaccine. One dose is recommended after age 51. Talk to your health care provider about which screenings and vaccines you need and how often you  need them. This information is not intended to replace advice given to you by your health care provider. Make sure you discuss any questions you have with your health care provider. Document Released: 02/11/2015 Document Revised: 10/05/2015 Document Reviewed: 11/16/2014 Elsevier Interactive Patient Education  2017 [place] Prevention in the Home Falls can cause injuries. They can happen to people of all ages. There are many things you can do to make your home safe and to help prevent falls. What can I do on the outside of my home?  Regularly fix the edges of walkways and driveways and fix any cracks.  Remove anything that might make you trip as you walk through a door, such as a raised step or threshold.  Trim any bushes or trees on the path to your home.  Use bright outdoor lighting.  Clear any walking paths of anything that might make someone trip, such as rocks or tools.  Regularly check to see if handrails are loose or broken. Make sure that both sides of any steps have handrails.  Any raised decks and porches should have guardrails on the edges.  Have any leaves, snow, or ice cleared regularly.  Use sand or salt on walking paths during winter.  Clean up any spills in your garage right away. This includes oil or grease spills. What can I do in the bathroom?  Use night lights.  Install grab bars by the toilet and in the tub and shower. Do not use towel bars as grab bars.  Use non-skid mats or decals in the tub or shower.  If you need to sit down in the shower, use a plastic, non-slip stool.  Keep the floor dry. Clean up any water that spills on the floor as soon as it happens.  Remove soap buildup in the tub or shower regularly.  Attach bath mats securely with double-sided non-slip rug tape.  Do not have throw rugs and other things on the floor that can make you trip. What can I do in the bedroom?  Use night lights.  Make sure that you have a light by  your bed that is easy to reach.  Do not use any sheets or blankets that are too big for your bed. They should not hang down onto the floor.  Have a firm chair that has side arms. You can use this for support while you get dressed.  Do not have throw rugs and other things on the floor that can make you trip. What can I do in the kitchen?  Clean up any spills right away.  Avoid walking on wet floors.  Keep items that you use a lot in easy-to-reach places.  If you need to reach something above you, use a strong step stool that has a grab bar.  Keep electrical cords out of the way.  Do not use floor polish or wax that makes floors slippery. If you must use wax, use non-skid floor wax.  Do not have throw rugs and other things on the floor that can make you trip. What can I do with my stairs?  Do not leave any items on the stairs.  Make sure that there are handrails on both sides of the stairs and use them.  Fix handrails that are broken or loose. Make sure that handrails are as long as the stairways.  Check any carpeting to make sure that it is firmly attached to the stairs. Fix any carpet that is loose or worn.  Avoid having throw rugs at the top or bottom of the stairs. If you do have throw rugs, attach them to the floor with carpet tape.  Make sure that you have a light switch at the top of the stairs and the bottom of the stairs. If you do not have them, ask someone to add them for you. What else can I do to help prevent falls?  Wear shoes that:  Do not have high heels.  Have rubber bottoms.  Are comfortable and fit you well.  Are closed at the toe. Do not wear sandals.  If you use a stepladder:  Make sure that it is fully opened. Do not climb a closed stepladder.  Make sure that both sides of the stepladder are locked into place.  Ask someone to hold it for you, if possible.  Clearly mark and make sure that you can see:  Any grab bars or handrails.  First and  last steps.  Where the edge of each step is.  Use tools that help you move around (mobility aids) if they are needed. These include:  Canes.  Walkers.  Scooters.  Crutches.  Turn on the lights when you go into a dark area. Replace any light bulbs as soon as they burn out.  Set up your furniture so you have a clear path. Avoid moving your furniture around.  If any of your floors are uneven, fix them.  If there are any pets around you, be aware of where they are.  Review your medicines with your doctor. Some medicines can make you feel dizzy. This can increase your chance of falling. Ask your doctor what other things that you can do to help prevent falls. This information is not intended to replace advice given to you by your health care provider. Make sure you discuss any questions you have with your health care provider. Document Released: 11/11/2008 Document Revised: 06/23/2015 Document Reviewed: 02/19/2014 Elsevier Interactive Patient Education  2017 [employer].

## 2018-06-03 NOTE — Progress Notes (Signed)
Subjective:   Krista Strickland is a 68 y.o. female who presents for Medicare Annual (Subsequent) preventive examination.  Review of Systems:    Cardiac Risk Factors include: advanced age (>23men, >16 women);diabetes mellitus;obesity (BMI >30kg/m2)     Objective:     Vitals: BP (!) 152/76   Pulse 60   Temp 99.2 F (37.3 C) (Oral)   Resp 14   Ht 5\' 3"  (1.6 m)   Wt 202 lb 12.8 oz (92 kg)   SpO2 98%   BMI 35.92 kg/m   Body mass index is 35.92 kg/m.  Advanced Directives 03/05/2018 06/06/2017 05/29/2017 08/24/2016 08/21/2016 03/13/2016 04/16/2012  Does Patient Have a Medical Advance Directive? No No No No No No Patient does not have advance directive;Patient would not like information  Would patient like information on creating a medical advance directive? No - Patient declined No - Patient declined No - Patient declined No - Patient declined No - Patient declined Yes (MAU/Ambulatory/Procedural Areas - Information given) -  Pre-existing out of facility DNR order (yellow form or pink MOST form) - - - - - - No    Tobacco Social History   Tobacco Use  Smoking Status Former Smoker  . Packs/day: 0.25  . Years: 12.00  . Pack years: 3.00  . Last attempt to quit: 07/16/1978  . Years since quitting: 39.9  Smokeless Tobacco Never Used     Counseling given: Yes   Clinical Intake:  Pre-visit preparation completed: Yes  Pain : 0-10 Pain Score: 8  Pain Type: Chronic pain Pain Location: Back Pain Orientation: Lower Pain Descriptors / Indicators: Constant, Spasm Pain Onset: More than a month ago Pain Frequency: Constant Pain Relieving Factors: pain medication Effect of Pain on Daily Activities: no  Pain Relieving Factors: pain medication  Nutritional Status: BMI > 30  Obese Nutritional Risks: None Diabetes: Yes CBG done?: No Did pt. bring in CBG monitor from home?: No  How often do you need to have someone help you when you read instructions, pamphlets, or other written  materials from your doctor or pharmacy?: 1 - Never What is the last grade level you completed in school?: 11  Interpreter Needed?: No     Past Medical History:  Diagnosis Date  . Arthritis   . Cancer ([place])    uterine  . Chronic kidney disease   . Coronary artery disease   . Diabetes mellitus, type 2 ([place])   . GERD (gastroesophageal reflux disease)    no medication in 2017  . Gout   . History of MRSA infection 03/2009   pt denies this  . Hypercalcemia 2017   managed by nephrology  . Hyperlipidemia   . Hypertension   . Obesity   . Psoriasis   . Stroke [facility]) 2013   "light"   Past Surgical History:  Procedure Laterality Date  . ABDOMINAL HYSTERECTOMY    . APPENDECTOMY  2012  . COLONOSCOPY WITH PROPOFOL N/A 08/24/2016   Procedure: COLONOSCOPY WITH PROPOFOL;  Surgeon: Rogene Houston, MD;  [facility];  Service: Endoscopy;  Laterality: N/A;  10:30  . excison of rt breast cyst    . LAPAROSCOPIC SALPINGO OOPHERECTOMY Right 04/22/2012   Procedure: LAPAROSCOPIC SALPINGO OOPHORECTOMY;  Surgeon: Jonnie Kind, MD;  Location: [facility] ORS;  Service: Gynecology;  Laterality: Right;  end 11:17  . MASS EXCISION N/A 04/22/2012   Procedure: EXCISION SKIN TAGS NECK AND HEAD;  Surgeon: Jonnie Kind, MD;  Location: [facility] ORS;  Service:  Gynecology;  Laterality: N/A;  start 11:19  . PARTIAL HYSTERECTOMY    . POLYPECTOMY  08/24/2016   Procedure: POLYPECTOMY;  Surgeon: Rogene Houston, MD;  [facility];  Service: Endoscopy;;  colon  . rt,. neck biopsy     Family History  Problem Relation Age of Onset  . Hypertension Brother   . Gout Brother   . Prostate cancer Brother   . Hypertension Brother   . Prostate cancer Brother   . Gout Brother   . Hypertension Sister   . Gout Sister   . Cancer Sister 93       pancreatic   . Leukemia Sister 76  . Gout Sister   . Prostate cancer Brother   . Diabetes Neg Hx    Social History   Socioeconomic History  . Marital status:  Married    Spouse name: Not on file  . Number of children: 1  . Years of education: Not on file  . Highest education level: 11th grade  Occupational History  . Occupation: disabled     Fish farm manager: UNEMPLOYED  Social Needs  . Financial resource strain: Not very hard  . Food insecurity:    Worry: Never true    Inability: Never true  . Transportation needs:    Medical: No    Non-medical: No  Tobacco Use  . Smoking status: Former Smoker    Packs/day: 0.25    Years: 12.00    Pack years: 3.00    Last attempt to quit: 07/16/1978    Years since quitting: 39.9  . Smokeless tobacco: Never Used  Substance and Sexual Activity  . Alcohol use: No  . Drug use: No  . Sexual activity: Yes    Birth control/protection: Surgical  Lifestyle  . Physical activity:    Days per week: 0 days    Minutes per session: 0 min  . Stress: Not at all  Relationships  . Social connections:    Talks on phone: Twice a week    Gets together: Once a week    Attends religious service: More than 4 times per year    Active member of club or organization: No    Attends meetings of clubs or organizations: Never    Relationship status: Married  Other Topics Concern  . Not on file  Social History Narrative  . Not on file    Outpatient Encounter Medications as of 06/03/2018  Medication Sig  . allopurinol (ZYLOPRIM) 300 MG tablet Take 1 tablet (300 mg total) by mouth daily.  Marland Kitchen aspirin 325 MG tablet Take 1 tablet (325 mg total) by mouth daily.  . calcitRIOL (ROCALTROL) 0.25 MCG capsule Take 0.25 mcg by mouth 3 (three) times a week. Mondays, Wednesdays,Fridays  . cyclobenzaprine (FLEXERIL) 10 MG tablet Take 10 mg by mouth 3 (three) times daily as needed for muscle spasms.   Marland Kitchen DILT-XR 180 MG 24 hr capsule Take 1 capsule by mouth once daily  . docusate sodium (COLACE) 100 MG capsule Take 100 mg by mouth 2 (two) times daily. Constipation  . DULoxetine (CYMBALTA) 30 MG capsule Take 30 mg by mouth daily.  Marland Kitchen ezetimibe  (ZETIA) 10 MG tablet Take 1 tablet (10 mg total) by mouth daily.  Marland Kitchen gabapentin (NEURONTIN) 400 MG capsule Take 400 mg by mouth 3 (three) times daily.  Marland Kitchen HYDROcodone-acetaminophen (NORCO/VICODIN) 5-325 MG tablet One tablet every six hours for pain.  Limit 7 days.  . insulin NPH-regular Human (NOVOLIN 70/30 RELION) (70-30) 100 UNIT/ML  injection Inject 200 Units into the skin daily with breakfast.  . insulin regular (NOVOLIN R RELION) 100 units/mL injection Inject 0.8 mLs (80 Units total) into the skin daily with supper.  . metolazone (ZAROXOLYN) 5 MG tablet Take 1 tablet by mouth once daily  . metoprolol tartrate (LOPRESSOR) 100 MG tablet TAKE 1 TABLET BY MOUTH TWICE DAILY  . midodrine (PROAMATINE) 5 MG tablet Take 5 mg by mouth 2 (two) times daily.   . ONE TOUCH ULTRA TEST test strip USE 1 STRIP TO CHECK GLUCOSE TWICE DAILY  . ONETOUCH DELICA LANCETS 51W MISC 1 Device by Does not apply route 2 (two) times daily.  . potassium chloride (K-DUR) 10 MEQ tablet TAKE 3 TABLETS BY MOUTH THREE TIMES DAILY  . pravastatin (PRAVACHOL) 20 MG tablet TAKE 1 TABLET BY MOUTH ONCE DAILY BEFORE BREAKFAST  . RELION INSULIN SYR 1CC/30G 30G X 5/16" 1 ML MISC USE ONE SYRINGE TO INJECT INSULIN SUBCUTANEOUSLY THREE TIMES DAILY AS DIRECTED  . RELION INSULIN SYRINGE 31G X 15/64" 1 ML MISC USE 1 SYRINGE THREE TIMES DAILY AS DIRECTED  . spironolactone (ALDACTONE) 25 MG tablet TAKE 1 TABLET BY MOUTH ONCE DAILY  . tiZANidine (ZANAFLEX) 4 MG tablet One by mouth every night before bed as needed for spasm  . torsemide (DEMADEX) 20 MG tablet Take 80 mg by mouth daily.    No facility-administered encounter medications on file as of 06/03/2018.     Activities of Daily Living In your present state of health, do you have any difficulty performing the following activities: 06/03/2018  Hearing? N  Vision? N  Difficulty concentrating or making decisions? N  Walking or climbing stairs? N  Dressing or bathing? N  Doing errands,  shopping? N  Preparing Food and eating ? N  Using the Toilet? N  In the past six months, have you accidently leaked urine? N  Do you have problems with loss of bowel control? N  Managing your Medications? N  Managing your Finances? N  Housekeeping or managing your Housekeeping? N  Some recent data might be hidden    Patient Care Team: Fayrene Helper, MD as PCP - General Phillips Odor, MD as Attending Physician (Neurology) Fran Lowes, MD as Attending Physician (Nephrology) Renato Shin, MD as Consulting Physician (Endocrinology) Luvenia Starch Darlin Drop as Physician Assistant (Neurosurgery)    Assessment:   This is a routine wellness examination for Kirstyn.  Exercise Activities and Dietary recommendations Current Exercise Habits: The patient does not participate in regular exercise at present, Exercise limited by: orthopedic condition(s)  Goals    . Have 3 meals a day     Recommend eating 3 balanced meals a day.       Fall Risk Fall Risk  06/03/2018 05/29/2018 02/12/2018 09/02/2017 05/29/2017  Falls in the past year? 0 0 1 Yes Yes  Number falls in past yr: - - 0 1 1  Injury with Fall? 0 0 0 Yes Yes  Comment - - - hip -  Risk for fall due to : - - - Impaired mobility (No Data)  Risk for fall due to: Comment - - - - patient was helping church member with panty hose and went to sit down on ottoman and missed it and fell and hurt hip. Has not went to ED to be seen.    Is the patient's home free of loose throw rugs in walkways, pet beds, electrical cords, etc?   yes      Grab bars  in the bathroom? yes      Handrails on the stairs?   no stairs      Adequate lighting?   yes  Timed Get Up and Go performed:    Depression Screen PHQ 2/9 Scores 06/03/2018 05/29/2018 02/12/2018 09/02/2017  PHQ - 2 Score 0 0 0 0  PHQ- 9 Score - - 2 -     Cognitive Function MMSE - Mini Mental State Exam 04/08/2014  Orientation to time 5  Orientation to Place 5  Registration 3   Attention/ Calculation 5  Recall 2  Language- name 2 objects 2  Language- repeat 1  Language- follow 3 step command 3  Language- read & follow direction 1  Write a sentence 1  Copy design 1  Total score 29     6CIT Screen 06/03/2018 05/29/2017 03/13/2016  What Year? 0 points 0 points 0 points  What month? 0 points 0 points 0 points  What time? 0 points 0 points 0 points  Count back from 20 0 points 0 points 0 points  Months in reverse 0 points 0 points 0 points  Repeat phrase 0 points 0 points 0 points  Total Score 0 0 0    Immunization History  Administered Date(s) Administered  . H1N1 01/01/2008  . Influenza Split 10/24/2011  . Influenza Whole 11/04/2006, 10/28/2007, 11/04/2008, 11/07/2009, 10/11/2010  . Influenza, High Dose Seasonal PF 11/18/2017  . Influenza,inj,Quad PF,6+ Mos 10/28/2012, 12/16/2013, 11/30/2014, 11/09/2015, 11/05/2016  . Pneumococcal Conjugate-13 08/05/2013  . Pneumococcal Polysaccharide-23 06/07/2004, 11/07/2009, 07/20/2015  . Td 09/21/2002  . Tdap 12/16/2013    Qualifies for Shingles Vaccine? Discussed at next visit  Screening Tests Health Maintenance  Topic Date Due  . INFLUENZA VACCINE  08/30/2018  . HEMOGLOBIN A1C  10/23/2018  . URINE MICROALBUMIN  02/14/2019  . OPHTHALMOLOGY EXAM  03/11/2019  . FOOT EXAM  04/22/2019  . MAMMOGRAM  02/21/2020  . TETANUS/TDAP  12/17/2023  . COLONOSCOPY  08/25/2026  . DEXA SCAN  Completed  . Hepatitis C Screening  Completed  . PNA vac Low Risk Adult  Completed    Cancer Screenings: Lung: Low Dose CT Chest recommended if Age 33-80 years, 30 pack-year currently smoking OR have quit w/in 15years. Patient does not qualify. Breast:  Up to date on Mammogram? Yes   Up to date of Bone Density/Dexa? Yes Colorectal:  Due in 2028  Additional Screenings:   Hepatitis C Screening: Completed     Plan:    1. Encounter for Medicare annual wellness exam  I have personally reviewed and noted the following in the  patient's chart:   . Medical and social history . Use of alcohol, tobacco or illicit drugs  . Current medications and supplements . Functional ability and status . Nutritional status . Physical activity . Advanced directives . List of other physicians . Hospitalizations, surgeries, and ER visits in previous 12 months . Vitals . Screenings to include cognitive, depression, and falls . Referrals and appointments  In addition, I have reviewed and discussed with patient certain preventive protocols, quality metrics, and best practice recommendations. A written personalized care plan for preventive services as well as general preventive health recommendations were provided to patient.     Perlie Mayo, NP  06/03/2018

## 2018-06-05 ENCOUNTER — Ambulatory Visit: Payer: PPO | Admitting: Family Medicine

## 2018-06-10 ENCOUNTER — Telehealth (HOSPITAL_COMMUNITY): Payer: Self-pay

## 2018-06-10 NOTE — Telephone Encounter (Signed)
I called Ms. Gigante at her home number and confirmed her identity. I informed her our office is re-opening and that if she feels a need to be re-assessed or would like to continue with her therapy we could schedule a visit for her. She was unsure of her decision and was questioning how often she would need to come in. I explained it would be a 1 time re-assessment and we could schedule additional visits if needed then. She decided that since she is following up with her MD in June she would like to be discharged and will return with a new referral if needed.   Kipp Brood, PT, DPT, [facility] Physical Therapist with [facility]  06/10/2018 2:22 PM

## 2018-06-20 ENCOUNTER — Encounter: Payer: Self-pay | Admitting: Endocrinology

## 2018-06-24 ENCOUNTER — Other Ambulatory Visit: Payer: Self-pay

## 2018-06-24 ENCOUNTER — Ambulatory Visit: Payer: PPO | Admitting: Endocrinology

## 2018-06-24 ENCOUNTER — Encounter: Payer: Self-pay | Admitting: Endocrinology

## 2018-06-24 VITALS — BP 152/68 | HR 74 | Temp 99.2°F | Wt 200.4 lb

## 2018-06-24 DIAGNOSIS — I10 Essential (primary) hypertension: Secondary | ICD-10-CM | POA: Diagnosis not present

## 2018-06-24 DIAGNOSIS — R609 Edema, unspecified: Secondary | ICD-10-CM

## 2018-06-24 DIAGNOSIS — Z794 Long term (current) use of insulin: Secondary | ICD-10-CM | POA: Diagnosis not present

## 2018-06-24 DIAGNOSIS — IMO0001 Reserved for inherently not codable concepts without codable children: Secondary | ICD-10-CM

## 2018-06-24 DIAGNOSIS — E1159 Type 2 diabetes mellitus with other circulatory complications: Secondary | ICD-10-CM

## 2018-06-24 DIAGNOSIS — E1129 Type 2 diabetes mellitus with other diabetic kidney complication: Secondary | ICD-10-CM

## 2018-06-24 DIAGNOSIS — E1140 Type 2 diabetes mellitus with diabetic neuropathy, unspecified: Secondary | ICD-10-CM | POA: Diagnosis not present

## 2018-06-24 LAB — POCT GLYCOSYLATED HEMOGLOBIN (HGB A1C): Hemoglobin A1C: 8.1 % — AB (ref 4.0–5.6)

## 2018-06-24 MED ORDER — INSULIN NPH ISOPHANE & REGULAR (70-30) 100 UNIT/ML ~~LOC~~ SUSP: 210.0000 [IU] | Freq: Every day | SUBCUTANEOUS | 11 refills | Status: DC

## 2018-06-24 NOTE — Progress Notes (Signed)
Subjective:    Patient ID: Krista Strickland, female    DOB: Mar 28, 1950, 68 y.o.   MRN: [id number]  HPI Pt returns for f/u of diabetes.   DM type: insulin-requiring type 2 Dx'ed: 8563 Complications: polyneuropathy, renal insufficiency and CVA.  Therapy: insulin since 2010 GDM: never DKA: never Severe hypoglycemia: never.   Pancreatitis: never.   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 she cannot afford analogs, so we had to try 70/30 (the NPH component caused no reaction this time).  She takes 70/30 QAM and reg QPM, due to the pattern of cbg's.   Interval history:  no cbg record, but states cbg's vary from 70-200's.  It is in general lowest fasting, but otherwise, there is no trend throughout the day.  pt states she feels well in general.  She says she never misses the insulin.   Pt also has small multinodular goiter (euthyroid; f/u US in 2017 showed no change, with no thyroid nodule meeting criteria for biopsy or surveillance; she is euthyroid off rx).    Past Medical History:  Diagnosis Date  . Arthritis   . Cancer ([place])    uterine  . Chronic kidney disease   . Coronary artery disease   . Diabetes mellitus, type 2 ([place])   . GERD (gastroesophageal reflux disease)    no medication in 2017  . Gout   . History of MRSA infection 03/2009   pt denies this  . Hypercalcemia 2017   managed by nephrology  . Hyperlipidemia   . Hypertension   . Obesity   . Psoriasis   . Stroke [facility]) 2013   "light"    Past Surgical History:  Procedure Laterality Date  . ABDOMINAL HYSTERECTOMY    . APPENDECTOMY  2012  . COLONOSCOPY WITH PROPOFOL N/A 08/24/2016   Procedure: COLONOSCOPY WITH PROPOFOL;  Surgeon: Rogene Houston, MD;  [facility];  Service: Endoscopy;  Laterality: N/A;  10:30  . excison of rt breast cyst    . LAPAROSCOPIC SALPINGO OOPHERECTOMY Right 04/22/2012   Procedure:  LAPAROSCOPIC SALPINGO OOPHORECTOMY;  Surgeon: Jonnie Kind, MD;  Location: [facility] ORS;  Service: Gynecology;  Laterality: Right;  end 11:17  . MASS EXCISION N/A 04/22/2012   Procedure: EXCISION SKIN TAGS NECK AND HEAD;  Surgeon: Jonnie Kind, MD;  Location: [facility] OR[facility];  Service: Gynecology;  Laterality: N/A;  start 11:19  . PARTIAL HYSTERECTOMY    . POLYPECTOMY  08/24/2016   Procedure: POLYPECTOMY;  Surgeon: Rogene Houston, MD;  Location: [facility];  Service: Endoscopy;;  colon  . rt,. neck biopsy      Social History   Socioeconomic History  . Marital status: Married    Spouse name: Not on file  . Number of children: 1  . Years of education: Not on file  . Highest education level: 11th grade  Occupational History  . Occupation: disabled     Fish farm manager: UNEMPLOYED  Social Needs  . Financial resource strain: Not very hard  . Food insecurity:    Worry: Never true    Inability: Never true  . Transportation needs:    Medical: No    Non-medical: No  Tobacco Use  . Smoking status: Former Smoker    Packs/day: 0.25    Years: 12.00    Pack years: 3.00    Last attempt to quit: 07/16/1978    Years since quitting: 39.9  .  Smokeless tobacco: Never Used  Substance and Sexual Activity  . Alcohol use: No  . Drug use: No  . Sexual activity: Yes    Birth control/protection: Surgical  Lifestyle  . Physical activity:    Days per week: 0 days    Minutes per session: 0 min  . Stress: Not at all  Relationships  . Social connections:    Talks on phone: Twice a week    Gets together: Once a week    Attends religious service: More than 4 times per year    Active member of club or organization: No    Attends meetings of clubs or organizations: Never    Relationship status: Married  . Intimate partner violence:    Fear of current or ex partner: No    Emotionally abused: No    Physically abused: No    Forced sexual activity: No  Other Topics Concern  . Not on file  Social History  Narrative  . Not on file    Current Outpatient Medications on File Prior to Visit  Medication Sig Dispense Refill  . allopurinol (ZYLOPRIM) 300 MG tablet Take 1 tablet (300 mg total) by mouth daily. 30 tablet 5  . aspirin 325 MG tablet Take 1 tablet (325 mg total) by mouth daily.    . calcitRIOL (ROCALTROL) 0.25 MCG capsule Take 0.25 mcg by mouth 3 (three) times a week. Mondays, Wednesdays,Fridays    . cyclobenzaprine (FLEXERIL) 10 MG tablet Take 10 mg by mouth 3 (three) times daily as needed for muscle spasms.     Marland Kitchen DILT-XR 180 MG 24 hr capsule Take 1 capsule by mouth once daily 90 capsule 0  . docusate sodium (COLACE) 100 MG capsule Take 100 mg by mouth 2 (two) times daily. Constipation    . DULoxetine (CYMBALTA) 30 MG capsule Take 30 mg by mouth daily.    Marland Kitchen ezetimibe (ZETIA) 10 MG tablet Take 1 tablet (10 mg total) by mouth daily. 90 tablet 3  . gabapentin (NEURONTIN) 400 MG capsule Take 400 mg by mouth 3 (three) times daily.    Marland Kitchen HYDROcodone-acetaminophen (NORCO/VICODIN) 5-325 MG tablet One tablet every six hours for pain.  Limit 7 days. 28 tablet 0  . insulin regular (NOVOLIN R RELION) 100 units/mL injection Inject 0.8 mLs (80 Units total) into the skin daily with supper. 30 mL 11  . metolazone (ZAROXOLYN) 5 MG tablet Take 1 tablet by mouth once daily 90 tablet 0  . metoprolol tartrate (LOPRESSOR) 100 MG tablet TAKE 1 TABLET BY MOUTH TWICE DAILY 180 tablet 2  . midodrine (PROAMATINE) 5 MG tablet Take 5 mg by mouth 2 (two) times daily.     . ONE TOUCH ULTRA TEST test strip USE 1 STRIP TO CHECK GLUCOSE TWICE DAILY 100 each 4  . ONETOUCH DELICA LANCETS 02O MISC 1 Device by Does not apply route 2 (two) times daily. 60 each 11  . potassium chloride (K-DUR) 10 MEQ tablet TAKE 3 TABLETS BY MOUTH THREE TIMES DAILY 270 tablet 0  . pravastatin (PRAVACHOL) 20 MG tablet TAKE 1 TABLET BY MOUTH ONCE DAILY BEFORE BREAKFAST 90 tablet 0  . RELION INSULIN SYR 1CC/30G 30G X 5/16" 1 ML MISC USE ONE SYRINGE  TO INJECT INSULIN SUBCUTANEOUSLY THREE TIMES DAILY AS DIRECTED 150 each 0  . RELION INSULIN SYRINGE 31G X 15/64" 1 ML MISC USE 1 SYRINGE THREE TIMES DAILY AS DIRECTED 150 each 0  . spironolactone (ALDACTONE) 25 MG tablet TAKE 1 TABLET BY MOUTH ONCE  DAILY 90 tablet 1  . tiZANidine (ZANAFLEX) 4 MG tablet One by mouth every night before bed as needed for spasm 30 tablet 3  . torsemide (DEMADEX) 20 MG tablet Take 80 mg by mouth daily.      No current facility-administered medications on file prior to visit.     Allergies  Allergen Reactions  . Benazepril Swelling  . Metronidazole Hives  . Mobic [Meloxicam] Hives  . Penicillins Hives and Swelling    Has patient had a PCN reaction causing immediate rash, facial/tongue/throat swelling, SOB or lightheadedness with hypotension: No Has patient had a PCN reaction causing severe rash involving mucus membranes or skin necrosis: Yes Has patient had a PCN reaction that required hospitalization: No Has patient had a PCN reaction occurring within the last 10 years: No If all of the above answers are "NO", then may proceed with Cephalosporin use.   . Sulfonamide Derivatives Hives    Family History  Problem Relation Age of Onset  . Hypertension Brother   . Gout Brother   . Prostate cancer Brother   . Hypertension Brother   . Prostate cancer Brother   . Gout Brother   . Hypertension Sister   . Gout Sister   . Cancer Sister 18       pancreatic   . Leukemia Sister 65  . Gout Sister   . Prostate cancer Brother   . Diabetes Neg Hx     BP (!) 152/68 (BP Location: Right Arm, Patient Position: Sitting, Cuff Size: Large)   Pulse 74   Temp 99.2 F (37.3 C) (Oral)   Wt 200 lb 6.4 oz (90.9 kg)   SpO2 96%   BMI 35.50 kg/m    Review of Systems She denies hypoglycemia    Objective:   Physical Exam VITAL SIGNS:  See vs page GENERAL: no distress Pulses: dorsalis pedis intact bilat.   MSK: no deformity of the feet CV: 1+ bilat leg edema  Skin:  no ulcer on the feet.  normal color and temp on the feet. Neuro: sensation is intact to touch on the feet Ext: multiple toenails are ingrown   Lab Results  Component Value Date   HGBA1C 8.1 (A) 06/24/2018       Assessment & Plan:  Insulin-requiring type 2 DM, with CVA: she needs increased rx Renal insuff: she need the increased insulin in her AM dose.  Pattern of cbg's supports this.  Edema: This limits rx options.  HTN: recheck next time.    Patient Instructions  check your blood sugar twice a day.  vary the time of day when you check, between before the 3 meals, and at bedtime.  also check if you have symptoms of your blood sugar being too high or too low.  please keep a record of the readings and bring it to your next appointment here.  You can write it on any piece of paper.  please call us sooner if your blood sugar goes below 70, or if you have a lot of readings over 200.   Please increase the 70/30 insulin to 210 units with breakfast.  However, take just 90 units on Sunday morning, and:  Please continue the same regular insulin: 80 units with supper.   On this type of insulin schedule, you should eat meals on a regular schedule.  If a meal is missed or significantly delayed, your blood sugar could go low.   Please come back for a follow-up appointment in 2 months.

## 2018-06-24 NOTE — Patient Instructions (Addendum)
check your blood sugar twice a day.  vary the time of day when you check, between before the 3 meals, and at bedtime.  also check if you have symptoms of your blood sugar being too high or too low.  please keep a record of the readings and bring it to your next appointment here.  You can write it on any piece of paper.  please call us sooner if your blood sugar goes below 70, or if you have a lot of readings over 200.   Please increase the 70/30 insulin to 210 units with breakfast.  However, take just 90 units on Sunday morning, and:  Please continue the same regular insulin: 80 units with supper.   On this type of insulin schedule, you should eat meals on a regular schedule.  If a meal is missed or significantly delayed, your blood sugar could go low.   Please come back for a follow-up appointment in 2 months.

## 2018-06-29 ENCOUNTER — Other Ambulatory Visit: Payer: Self-pay | Admitting: Family Medicine

## 2018-06-30 DIAGNOSIS — M79674 Pain in right toe(s): Secondary | ICD-10-CM | POA: Diagnosis not present

## 2018-06-30 DIAGNOSIS — E1140 Type 2 diabetes mellitus with diabetic neuropathy, unspecified: Secondary | ICD-10-CM | POA: Diagnosis not present

## 2018-06-30 DIAGNOSIS — B351 Tinea unguium: Secondary | ICD-10-CM | POA: Diagnosis not present

## 2018-06-30 DIAGNOSIS — M79675 Pain in left toe(s): Secondary | ICD-10-CM | POA: Diagnosis not present

## 2018-07-02 DIAGNOSIS — I951 Orthostatic hypotension: Secondary | ICD-10-CM | POA: Diagnosis not present

## 2018-07-02 DIAGNOSIS — E1140 Type 2 diabetes mellitus with diabetic neuropathy, unspecified: Secondary | ICD-10-CM | POA: Diagnosis not present

## 2018-07-02 DIAGNOSIS — I69898 Other sequelae of other cerebrovascular disease: Secondary | ICD-10-CM | POA: Diagnosis not present

## 2018-07-02 DIAGNOSIS — R2689 Other abnormalities of gait and mobility: Secondary | ICD-10-CM | POA: Diagnosis not present

## 2018-07-08 ENCOUNTER — Ambulatory Visit: Payer: PPO | Admitting: Orthopaedic Surgery

## 2018-07-08 ENCOUNTER — Other Ambulatory Visit: Payer: Self-pay

## 2018-07-08 ENCOUNTER — Encounter: Payer: Self-pay | Admitting: Orthopaedic Surgery

## 2018-07-08 VITALS — BP 154/64 | HR 65 | Temp 97.8°F | Ht 63.0 in | Wt 202.0 lb

## 2018-07-08 DIAGNOSIS — G8929 Other chronic pain: Secondary | ICD-10-CM | POA: Diagnosis not present

## 2018-07-08 DIAGNOSIS — M5442 Lumbago with sciatica, left side: Secondary | ICD-10-CM

## 2018-07-08 MED ORDER — HYDROCODONE-ACETAMINOPHEN 5-325 MG PO TABS: ORAL_TABLET | ORAL | 0 refills | Status: DC

## 2018-07-08 NOTE — Progress Notes (Signed)
Patient Krista Strickland, female DOB:04-16-1950, 68 y.o. IRS:[id number]  Chief Complaint  Patient presents with  . Back Pain  . Medication Refill    hydrocodone     HPI  Krista Strickland is a 68 y.o. female who has lower back pain chronically with more left sided sciatica.  She has good and bad days. She is trying to do her exercises.  I will set up PT for her again.  We had to stop it because of COVID-19.   Body mass index is 35.78 kg/m.  ROS  Review of Systems  Constitutional: Positive for activity change.  Musculoskeletal: Positive for arthralgias, back pain, gait problem and joint swelling.  All other systems reviewed and are negative.   All other systems reviewed and are negative.  The following is a summary of the past history medically, past history surgically, known current medicines, social history and family history.  This information is gathered electronically by the computer from prior information and documentation.  I review this each visit and have found including this information at this point in the chart is beneficial and informative.    Past Medical History:  Diagnosis Date  . Arthritis   . Cancer ([place])    uterine  . Chronic kidney disease   . Coronary artery disease   . Diabetes mellitus, type 2 ([place])   . GERD (gastroesophageal reflux disease)    no medication in 2017  . Gout   . History of MRSA infection 03/2009   pt denies this  . Hypercalcemia 2017   managed by nephrology  . Hyperlipidemia   . Hypertension   . Obesity   . Psoriasis   . Stroke [facility]) 2013   "light"    Past Surgical History:  Procedure Laterality Date  . ABDOMINAL HYSTERECTOMY    . APPENDECTOMY  2012  . COLONOSCOPY WITH PROPOFOL N/A 08/24/2016   Procedure: COLONOSCOPY WITH PROPOFOL;  Surgeon: Rogene Houston, MD;  [facility];  Service: Endoscopy;  Laterality: N/A;  10:30  . excison of rt breast cyst    . LAPAROSCOPIC SALPINGO OOPHERECTOMY Right 04/22/2012   Procedure: LAPAROSCOPIC SALPINGO OOPHORECTOMY;  Surgeon: Jonnie Kind, MD;  Location: [facility] ORS;  Service: Gynecology;  Laterality: Right;  end 11:17  . MASS EXCISION N/A 04/22/2012   Procedure: EXCISION SKIN TAGS NECK AND HEAD;  Surgeon: Jonnie Kind, MD;  Location: [facility] ORS;  Service: Gynecology;  Laterality: N/A;  start 11:19  . PARTIAL HYSTERECTOMY    . POLYPECTOMY  08/24/2016   Procedure: POLYPECTOMY;  Surgeon: Rogene Houston, MD;  Location[facility];  Service: Endoscopy;;  colon  . rt,. neck biopsy      Family History  Problem Relation Age of Onset  . Hypertension Brother   . Gout Brother   . Prostate cancer Brother   . Hypertension Brother   . Prostate cancer Brother   . Gout Brother   . Hypertension Sister   . Gout Sister   . Cancer Sister 60       pancreatic   . Leukemia Sister 56  . Gout Sister   . Prostate cancer Brother   . Diabetes Neg Hx     Social History Social History   Tobacco Use  . Smoking status: Former Smoker    Packs/day: 0.25    Years: 12.00    Pack years: 3.00    Last attempt to quit: 07/16/1978    Years since quitting: 40.0  . Smokeless  tobacco: Never Used  Substance Use Topics  . Alcohol use: No  . Drug use: No    Allergies  Allergen Reactions  . Benazepril Swelling  . Metronidazole Hives  . Mobic [Meloxicam] Hives  . Penicillins Hives and Swelling    Has patient had a PCN reaction causing immediate rash, facial/tongue/throat swelling, SOB or lightheadedness with hypotension: No Has patient had a PCN reaction causing severe rash involving mucus membranes or skin necrosis: Yes Has patient had a PCN reaction that required hospitalization: No Has patient had a PCN reaction occurring within the last 10 years: No If all of the above answers are "NO", then may proceed with Cephalosporin use.   . Sulfonamide Derivatives Hives    Current Outpatient Medications  Medication Sig Dispense Refill  . allopurinol (ZYLOPRIM) 300 MG tablet  Take 1 tablet (300 mg total) by mouth daily. 30 tablet 5  . aspirin 325 MG tablet Take 1 tablet (325 mg total) by mouth daily.    . calcitRIOL (ROCALTROL) 0.25 MCG capsule Take 0.25 mcg by mouth 3 (three) times a week. Mondays, Wednesdays,Fridays    . cinacalcet (SENSIPAR) 30 MG tablet TAKE 1 TABLET BY MOUTH ONCE DAILY WITH  EVENING  MEAL    . cyclobenzaprine (FLEXERIL) 10 MG tablet Take 10 mg by mouth 3 (three) times daily as needed for muscle spasms.     Marland Kitchen DILT-XR 180 MG 24 hr capsule Take 1 capsule by mouth once daily 90 capsule 0  . docusate sodium (COLACE) 100 MG capsule Take 100 mg by mouth 2 (two) times daily. Constipation    . DULoxetine (CYMBALTA) 30 MG capsule Take 30 mg by mouth daily.    Marland Kitchen ezetimibe (ZETIA) 10 MG tablet Take 1 tablet (10 mg total) by mouth daily. 90 tablet 3  . gabapentin (NEURONTIN) 400 MG capsule Take 400 mg by mouth 3 (three) times daily.    Marland Kitchen HYDROcodone-acetaminophen (NORCO/VICODIN) 5-325 MG tablet One tablet every six hours for pain.  Limit 7 days. 28 tablet 0  . insulin NPH-regular Human (NOVOLIN 70/30 RELION) (70-30) 100 UNIT/ML injection Inject 210 Units into the skin daily with breakfast. 70 mL 11  . insulin regular (NOVOLIN R RELION) 100 units/mL injection Inject 0.8 mLs (80 Units total) into the skin daily with supper. 30 mL 11  . metolazone (ZAROXOLYN) 5 MG tablet Take 1 tablet by mouth once daily 90 tablet 0  . metoprolol tartrate (LOPRESSOR) 100 MG tablet TAKE 1 TABLET BY MOUTH TWICE DAILY 180 tablet 2  . midodrine (PROAMATINE) 5 MG tablet Take 5 mg by mouth 2 (two) times daily.     . ONE TOUCH ULTRA TEST test strip USE 1 STRIP TO CHECK GLUCOSE TWICE DAILY 100 each 4  . ONETOUCH DELICA LANCETS 56L MISC 1 Device by Does not apply route 2 (two) times daily. 60 each 11  . potassium chloride (K-DUR) 10 MEQ tablet TAKE 3 TABLETS BY MOUTH THREE TIMES DAILY 270 tablet 1  . pravastatin (PRAVACHOL) 20 MG tablet TAKE 1 TABLET BY MOUTH ONCE DAILY BEFORE BREAKFAST  90 tablet 1  . RELION INSULIN SYR 1CC/30G 30G X 5/16" 1 ML MISC USE ONE SYRINGE TO INJECT INSULIN SUBCUTANEOUSLY THREE TIMES DAILY AS DIRECTED 150 each 0  . RELION INSULIN SYRINGE 31G X 15/64" 1 ML MISC USE 1 SYRINGE THREE TIMES DAILY AS DIRECTED 150 each 0  . spironolactone (ALDACTONE) 25 MG tablet TAKE 1 TABLET BY MOUTH ONCE DAILY 90 tablet 1  . tiZANidine (ZANAFLEX) 4  MG tablet One by mouth every night before bed as needed for spasm 30 tablet 3  . torsemide (DEMADEX) 20 MG tablet Take 80 mg by mouth daily.      No current facility-administered medications for this visit.      Physical Exam  Blood pressure (!) 154/64, pulse 65, temperature 97.8 F (36.6 C), height 5\' 3"  (1.6 m), weight 202 lb (91.6 kg).  Constitutional: overall normal hygiene, normal nutrition, well developed, normal grooming, normal body habitus. Assistive device:none  Musculoskeletal: gait and station Limp none, muscle tone and strength are normal, no tremors or atrophy is present.  .  Neurological: coordination overall normal.  Deep tendon reflex/nerve stretch intact.  Sensation normal.  Cranial nerves II-XII intact.   Skin:   Normal overall no scars, lesions, ulcers or rashes. No psoriasis.  Psychiatric: Alert and oriented x 3.  Recent memory intact, remote memory unclear.  Normal mood and affect. Well groomed.  Good eye contact.  Cardiovascular: overall no swelling, no varicosities, no edema bilaterally, normal temperatures of the legs and arms, no clubbing, cyanosis and good capillary refill.  Lymphatic: palpation is normal.  Spine/Pelvis examination:  Inspection:  Overall, sacoiliac joint benign and hips nontender; without crepitus or defects.   Thoracic spine inspection: Alignment normal without kyphosis present   Lumbar spine inspection:  Alignment  with normal lumbar lordosis, without scoliosis apparent.   Thoracic spine palpation:  without tenderness of spinal processes   Lumbar spine palpation:  without tenderness of lumbar area; without tightness of lumbar muscles    Range of Motion:   Lumbar flexion, forward flexion is normal without pain or tenderness    Lumbar extension is full without pain or tenderness   Left lateral bend is normal without pain or tenderness   Right lateral bend is normal without pain or tenderness   Straight leg raising is normal  Strength & tone: normal   Stability overall normal stability  All other systems reviewed and are negative   The patient has been educated about the nature of the problem(s) and counseled on treatment options.  The patient appeared to understand what I have discussed and is in agreement with it.  Encounter Diagnosis  Name Primary?  . Chronic left-sided low back pain with left-sided sciatica Yes    PLAN Call if any problems.  Precautions discussed.  Continue current medications.   Return to clinic 3 weeks   Begin PT again.  I have reviewed the [place] web site prior to prescribing narcotic medicine for this patient.   Electronically Signed Sanjuana Kava, MD 6/9/202010:18 AM

## 2018-07-11 DIAGNOSIS — N183 Chronic kidney disease, stage 3 (moderate): Secondary | ICD-10-CM | POA: Diagnosis not present

## 2018-07-11 DIAGNOSIS — I10 Essential (primary) hypertension: Secondary | ICD-10-CM | POA: Diagnosis not present

## 2018-07-11 DIAGNOSIS — R809 Proteinuria, unspecified: Secondary | ICD-10-CM | POA: Diagnosis not present

## 2018-07-11 DIAGNOSIS — D649 Anemia, unspecified: Secondary | ICD-10-CM | POA: Diagnosis not present

## 2018-07-11 DIAGNOSIS — E559 Vitamin D deficiency, unspecified: Secondary | ICD-10-CM | POA: Diagnosis not present

## 2018-07-11 DIAGNOSIS — Z79899 Other long term (current) drug therapy: Secondary | ICD-10-CM | POA: Diagnosis not present

## 2018-07-18 ENCOUNTER — Other Ambulatory Visit: Payer: Self-pay

## 2018-07-18 ENCOUNTER — Ambulatory Visit (HOSPITAL_COMMUNITY): Payer: PPO | Attending: Orthopaedic Surgery

## 2018-07-18 ENCOUNTER — Encounter (HOSPITAL_COMMUNITY): Payer: Self-pay

## 2018-07-18 DIAGNOSIS — M545 Low back pain, unspecified: Secondary | ICD-10-CM

## 2018-07-18 DIAGNOSIS — G8929 Other chronic pain: Secondary | ICD-10-CM | POA: Diagnosis not present

## 2018-07-18 DIAGNOSIS — R29898 Other symptoms and signs involving the musculoskeletal system: Secondary | ICD-10-CM

## 2018-07-18 NOTE — Therapy (Signed)
[place], [place], [address] Phone: [phone]   Fax:  [phone]   Progress Note Reporting Period 04/03/18 to 07/18/18  See note below for Objective Data and Assessment of Progress/Goals.    Physical Therapy Treatment  Patient Details  Name: Krista Strickland MRN: [id number] Date of Birth: [birth year] Referring Provider (PT): Sanjuana Kava, MD   Encounter Date: 07/18/2018  PT End of Session - 07/18/18 0913    Visit Number  15    Number of Visits  21    Date for PT Re-Evaluation  08/08/18   Updated for new Cert   Authorization Type  [employer] (No visit limit)    Authorization Time Period  03/05/18 - 04/02/18; 04/03/18- 04/24/18; new: 07/18/18-08/08/18    Authorization - Visit Number  15    Authorization - Number of Visits  21    PT Start Time  0915    PT Stop Time  0956    PT Time Calculation (min)  41 min    Activity Tolerance  Patient tolerated treatment well;No increased pain    Behavior During Therapy  WFL for tasks assessed/performed       Past Medical History:  Diagnosis Date  . Arthritis   . Cancer ([place])    uterine  . Chronic kidney disease   . Coronary artery disease   . Diabetes mellitus, type 2 ([place])   . GERD (gastroesophageal reflux disease)    no medication in 2017  . Gout   . History of MRSA infection 03/2009   pt denies this  . Hypercalcemia 2017   managed by nephrology  . Hyperlipidemia   . Hypertension   . Obesity   . Psoriasis   . Stroke [facility]) 2013   "light"    Past Surgical History:  Procedure Laterality Date  . ABDOMINAL HYSTERECTOMY    . APPENDECTOMY  2012  . COLONOSCOPY WITH PROPOFOL N/A 08/24/2016   Procedure: COLONOSCOPY WITH PROPOFOL;  Surgeon: Rogene Houston, MD;  [facility];  Service: Endoscopy;  Laterality: N/A;  10:30  . excison of rt breast cyst    . LAPAROSCOPIC SALPINGO OOPHERECTOMY Right 04/22/2012   Procedure: LAPAROSCOPIC SALPINGO  OOPHORECTOMY;  Surgeon: Jonnie Kind, MD;  Location: [facility] ORS;  Service: Gynecology;  Laterality: Right;  end 11:17  . MASS EXCISION N/A 04/22/2012   Procedure: EXCISION SKIN TAGS NECK AND HEAD;  Surgeon: Jonnie Kind, MD;  Location: [facility];  Service: Gynecology;  Laterality: N/A;  start 11:19  . PARTIAL HYSTERECTOMY    . POLYPECTOMY  08/24/2016   Procedure: POLYPECTOMY;  Surgeon: Rogene Houston, MD;  Location[facility];  Service: Endoscopy;;  colon  . rt,. neck biopsy      There were no vitals filed for this visit.  Subjective Assessment - 07/18/18 0919    Subjective  Pt reports she hasn't been doing exercises at home because it is too painful. Pt reports no improvement in pain and she stays in pain. Pt reports muscle spasms in the back after standing for 2-5 minutes. Pt reports she was able to "move about" more when she was attending therapy consistently. Pt reports she saw her MD 07/06/18 and he educated her to continue therapy to improve pain and mobility. Pt denies any falls at home since last therapy appointment.    Limitations  Standing;Walking;House hold activities;Lifting    How long can you sit comfortably?  Not limited  How long can you stand comfortably?  2-5 minutes    How long can you walk comfortably?  5 minutes    Diagnostic tests  X-ray of left hip 06/06/17: Negative; See imaging for more details    Patient Stated Goals  To have decreased pain    Currently in Pain?  Yes    Pain Score  6     Pain Location  Back    Pain Orientation  Lower    Pain Descriptors / Indicators  Aching    Pain Type  Chronic pain    Pain Radiating Towards  radiculopathy down LEs    Pain Onset  More than a month ago    Pain Frequency  Intermittent    Aggravating Factors   standing up straight    Pain Relieving Factors  sitting    Effect of Pain on Daily Activities  moderately affects         [facility] PT Assessment - 07/18/18 0001      Assessment   Medical Diagnosis  Chronic Left-sided  low back pain with left-sided sciatica    Referring Provider (PT)  Sanjuana Kava, MD    Next MD Visit  later this month      Observation/Other Assessments   Focus on Therapeutic Outcomes (FOTO)   66% limited   was 64% limited     Sensation   Light Touch  Appears Intact      Posture/Postural Control   Posture/Postural Control  Postural limitations    Postural Limitations  Rounded Shoulders;Forward head;Decreased lumbar lordosis      AROM   Lumbar Flexion  WNL   was WNL, Gower's sign   Lumbar Extension  75% limited   was 75% limited   Lumbar - Right Side Bend  25% limited   was 25% limited   Lumbar - Left Side Bend  25% limited   was 25% limited   Lumbar - Right Rotation  50% limited   was 75% limited   Lumbar - Left Rotation  75% limited   radicular pain down BLE; was 75% limited     Strength   Right Hip Flexion  3+/5   was 4   Right Hip Extension  2+/5   was 3-   Right Hip ABduction  3-/5   was 4   Left Hip Flexion  3+/5   was 4   Left Hip Extension  2+/5   was 3-   Left Hip ABduction  3+/5   was 4-   Right Knee Flexion  3+/5   was 4+   Right Knee Extension  3+/5   was 4   Left Knee Flexion  3+/5   was 4+   Left Knee Extension  3+/5   was 4   Right Ankle Dorsiflexion  5/5   was 5   Left Ankle Dorsiflexion  5/5      Palpation   Palpation comment  tenderness to lumbar and thoracic paraspinals bilaterally      Balance   Balance Assessed  Yes      Static Standing Balance   Static Standing - Balance Support  No upper extremity supported    Static Standing Balance -  Activities   Single Leg Stance - Right Leg;Single Leg Stance - Left Leg    Static Standing - Comment/# of Minutes  L: 5 sec or <; R: 1 sec or <            PT Education - 07/18/18  0957    Education Details  progress with PT, continue HEP and reprinted, exercise technique    Person(s) Educated  Patient    Methods  Explanation;Demonstration;Handout    Comprehension  Verbalized  understanding;Returned demonstration       PT Short Term Goals - 07/18/18 0946      PT SHORT TERM GOAL #1   Title  Patient will report understanding and regular compliance with HEP in order to improve, strength, balance, functional mobility, and decrease back pain.     Time  2    Period  Weeks    Status  On-going      PT SHORT TERM GOAL #2   Title  Patient will demonstrate ability to maintain single limb stance on each lower extremity for at least 5 seconds on each lower extremity indicating improved stability and balance to negotiate stairs and curbs.     Baseline  04/03/18: Patient achieved on the left LE, but not the right. ; 6/19: R 1 sec or <, L 5 sec or <    Time  2    Period  Weeks    Status  Partially Met        PT Long Term Goals - 07/18/18 0947      PT LONG TERM GOAL #1   Title  Patient will demonstrate improvement of at least 1/2 MMT strength grade in all deficient muscle groups in order to improve mechanics with transfers and ambulation.     Baseline  6/19: see MMT    Time  4    Period  Weeks    Status  Achieved      PT LONG TERM GOAL #2   Title  Patient will report ability to stand for at least 20 minutes in order to prepare a meal at home without requiring to sit.     Baseline  6/19: pt reports 2 minutes before needing to sit    Time  3    Period  Weeks    Status  On-going      PT LONG TERM GOAL #3   Title  Patient will report that her pain has not exceeded a 3/10 over the course of a 1 week period indicating improved tolerance to daily activities.     Baseline  6/19: pt reports 8/10    Time  3    Period  Weeks    Status  On-going      PT LONG TERM GOAL #4   Title  Patient will demonstrate improvement of at least 1 MMT strength grade from time of evaluation in all deficient muscle groups in order to improve mechanics with transfers and ambulation.     Baseline  6/19: see MMT    Time  3    Period  Weeks    Status  New            Plan - 07/18/18  0913    Clinical Impression Statement  Pt is a pleasant 68YO female who returned for reassessment today and hasn't been to therapy since 04/17/18 due to COVID-19. Pt continues to have significant LBP and radicular pain down BLE when standing at home statically or walking around limiting her ability to perform household chores like cooking and cleaning. Pt continues to be significantly limited per FOTO score of 66% indicating significant self perceived deficits. Pt continues to be limited with SLS and demonstrates BLE weakness noted at initial evaluation. Pt unable to perform HEP at home due to  pain with exercise performance and losing handout.  Pt verbalizes noticeable improvement when involved in weekly PT prior to COVID-19 shutdown and agreeable to continue therapy in order to establish concrete HEP to complete independently at home. Pt would benefit from skilled PT to improve deficits noted to improve pain, independence with HEP and overall independence with functional mobility to increase QOL. PT reeducated pt on HEP and provided new handout.    Rehab Potential  Fair    Clinical Impairments Affecting Rehab Potential  Positive: Motivated; Negative: Comorbidites and chronicity of issue    PT Frequency  2x / week    PT Duration  3 weeks    PT Treatment/Interventions  ADLs/Self Care Home Management;Moist Heat;DME Instruction;Gait training;Stair training;Functional mobility training;Therapeutic activities;Therapeutic exercise;Balance training;Neuromuscular re-education;Patient/family education;Orthotic Fit/Training;Manual techniques;Passive range of motion;Dry needling;Energy conservation;Taping    PT Next Visit Plan  Review HEP; initiate hip and knee strengthening exercises to address weakness and balance deficits, continue stretching to paraspinals, QL, and bil hips to reduce pain and improve ROM.    PT Home Exercise Plan  2/6: bridges, single knee to chest; 03/13/18: Hamstring stretch supine with towel  3x30'' 1-2x/day; 6/19: re-printed estabilshed HEP, changed hamstring stretch to seated with straight leg    Consulted and Agree with Plan of Care  Patient       Patient will benefit from skilled therapeutic intervention in order to improve the following deficits and impairments:  Abnormal gait, Decreased balance, Decreased endurance, Decreased mobility, Difficulty walking, Increased muscle spasms, Decreased range of motion, Improper body mechanics, Decreased activity tolerance, Decreased strength, Increased fascial restricitons, Postural dysfunction, Pain  Visit Diagnosis: 1. Chronic bilateral low back pain, unspecified whether sciatica present   2. Other symptoms and signs involving the musculoskeletal system        Problem List Patient Active Problem List   Diagnosis Date Noted  . Type 2 diabetes mellitus with diabetic neuropathy, unspecified ([place]) 09/08/2017  . Special screening for malignant neoplasms, colon 07/06/2016  . Diabetic neuropathy ([place]) 11/09/2015  . Annual physical exam 12/16/2013  . Multinodular goiter (nontoxic) 09/20/2013  . Hypertriglyceridemia 08/05/2013  . Back pain 04/16/2013  . Type 2 diabetes mellitus with hyperglycemia ([place]) 12/07/2012  . 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  . CVA (cerebral vascular accident) ([place]) 07/23/2011  . CKD (chronic kidney disease) stage 3, GFR 30-59 ml/min ([facility]) 11/07/2009  . SLEEP APNEA 09/06/2009  . SKIN TAG 07/14/2009  . Hyperuricemia 11/04/2008  . Hyperlipidemia LDL goal <100 07/02/2007  . Morbid obesity ([place]) 07/02/2007  . Essential hypertension 07/02/2007      Talbot Grumbling PT, DPT  [facility] [address] [place], [place], [address] Phone: [phone]   Fax:  [phone]  Name: Krista Strickland MRN: [id number] Date of Birth: [birth year]

## 2018-07-20 ENCOUNTER — Other Ambulatory Visit: Payer: Self-pay | Admitting: Endocrinology

## 2018-07-22 ENCOUNTER — Telehealth (HOSPITAL_COMMUNITY): Payer: Self-pay

## 2018-07-22 ENCOUNTER — Ambulatory Visit (HOSPITAL_COMMUNITY): Payer: PPO

## 2018-07-22 NOTE — Telephone Encounter (Signed)
She can  not get here today

## 2018-07-24 ENCOUNTER — Ambulatory Visit (HOSPITAL_COMMUNITY): Payer: PPO

## 2018-07-24 ENCOUNTER — Telehealth (HOSPITAL_COMMUNITY): Payer: Self-pay

## 2018-07-24 NOTE — Telephone Encounter (Signed)
She doesn't feel like coming today- please cx.

## 2018-07-29 ENCOUNTER — Telehealth (HOSPITAL_COMMUNITY): Payer: Self-pay

## 2018-07-29 ENCOUNTER — Ambulatory Visit (INDEPENDENT_AMBULATORY_CARE_PROVIDER_SITE_OTHER): Payer: PPO | Admitting: Orthopaedic Surgery

## 2018-07-29 ENCOUNTER — Ambulatory Visit (HOSPITAL_COMMUNITY): Payer: PPO

## 2018-07-29 ENCOUNTER — Encounter: Payer: Self-pay | Admitting: Orthopaedic Surgery

## 2018-07-29 ENCOUNTER — Other Ambulatory Visit: Payer: Self-pay

## 2018-07-29 VITALS — BP 144/69 | HR 62 | Temp 98.1°F | Ht 63.0 in | Wt 200.0 lb

## 2018-07-29 DIAGNOSIS — E0849 Diabetes mellitus due to underlying condition with other diabetic neurological complication: Secondary | ICD-10-CM

## 2018-07-29 DIAGNOSIS — G8929 Other chronic pain: Secondary | ICD-10-CM | POA: Diagnosis not present

## 2018-07-29 DIAGNOSIS — M5442 Lumbago with sciatica, left side: Secondary | ICD-10-CM

## 2018-07-29 DIAGNOSIS — G609 Hereditary and idiopathic neuropathy, unspecified: Secondary | ICD-10-CM

## 2018-07-29 MED ORDER — HYDROCODONE-ACETAMINOPHEN 5-325 MG PO TABS: ORAL_TABLET | ORAL | 0 refills | Status: DC

## 2018-07-29 NOTE — Progress Notes (Addendum)
Patient Krista Strickland, female DOB:[birth year], 68 y.o. JAS:[id number]  Chief Complaint  Patient presents with  . Back Pain    achy/ constant/radiates down both legs    HPI  [name] is a 68 y.o. female who has chronic lower back pain. She did not go to PT as she said they make her worse. She is tried of being inside secondary to COVID-19. She is depressed.  She says she has no energy.  I have gone over exercise program for her, mainly getting out and walking, going to the [facility] and walk with a friend three days a week. She will try it.  She has no paresthesias today but has them at times. She has no weakness. She has no new trauma.   Body mass index is 35.43 kg/m.  ROS  Review of Systems  Constitutional: Positive for activity change.  Musculoskeletal: Positive for arthralgias, back pain, gait problem and joint swelling.  All other systems reviewed and are negative.   All other systems reviewed and are negative.  The following is a summary of the past history medically, past history surgically, known current medicines, social history and family history.  This information is gathered electronically by the computer from prior information and documentation.  I review this each visit and have found including this information at this point in the chart is beneficial and informative.    Past Medical History:  Diagnosis Date  . Arthritis   . Cancer ([place])    uterine  . Chronic kidney disease   . Coronary artery disease   . Diabetes mellitus, type 2 ([place])   . GERD (gastroesophageal reflux disease)    no medication in 2017  . Gout   . History of MRSA infection 03/2009   pt denies this  . Hypercalcemia 2017   managed by nephrology  . Hyperlipidemia   . Hypertension   . Obesity   . Psoriasis   . Stroke [facility]) 2013   "light"    Past Surgical History:  Procedure Laterality Date  . ABDOMINAL HYSTERECTOMY    . APPENDECTOMY  2012  . COLONOSCOPY WITH PROPOFOL N/A  08/24/2016   Procedure: COLONOSCOPY WITH PROPOFOL;  Surgeon: Rogene Houston, MD;  [facility];  Service: Endoscopy;  Laterality: N/A;  10:30  . excison of rt breast cyst    . LAPAROSCOPIC SALPINGO OOPHERECTOMY Right 04/22/2012   Procedure: LAPAROSCOPIC SALPINGO OOPHORECTOMY;  Surgeon: Jonnie Kind, MD;  Location: [facility] ORS;  Service: Gynecology;  Laterality: Right;  end 11:17  . MASS EXCISION N/A 04/22/2012   Procedure: EXCISION SKIN TAGS NECK AND HEAD;  Surgeon: Jonnie Kind, MD;  Location: [facility] ORS;  Service: Gynecology;  Laterality: N/A;  start 11:19  . PARTIAL HYSTERECTOMY    . POLYPECTOMY  08/24/2016   Procedure: POLYPECTOMY;  Surgeon: Rogene Houston, MD;  [facility];  Service: Endoscopy;;  colon  . rt,. neck biopsy      Family History  Problem Relation Age of Onset  . Hypertension Brother   . Gout Brother   . Prostate cancer Brother   . Hypertension Brother   . Prostate cancer Brother   . Gout Brother   . Hypertension Sister   . Gout Sister   . Cancer Sister 27       pancreatic   . Leukemia Sister 12  . Gout Sister   . Prostate cancer Brother   . Diabetes Neg Hx     Social History  Social History   Tobacco Use  . Smoking status: Former Smoker    Packs/day: 0.25    Years: 12.00    Pack years: 3.00    Quit date: 07/16/1978    Years since quitting: 40.0  . Smokeless tobacco: Never Used  Substance Use Topics  . Alcohol use: No  . Drug use: No    Allergies  Allergen Reactions  . Benazepril Swelling  . Metronidazole Hives  . Mobic [Meloxicam] Hives  . Penicillins Hives and Swelling    Has patient had a PCN reaction causing immediate rash, facial/tongue/throat swelling, SOB or lightheadedness with hypotension: No Has patient had a PCN reaction causing severe rash involving mucus membranes or skin necrosis: Yes Has patient had a PCN reaction that required hospitalization: No Has patient had a PCN reaction occurring within the last 10  years: No If all of the above answers are "NO", then may proceed with Cephalosporin use.   . Sulfonamide Derivatives Hives    Current Outpatient Medications  Medication Sig Dispense Refill  . allopurinol (ZYLOPRIM) 300 MG tablet Take 1 tablet (300 mg total) by mouth daily. 30 tablet 5  . aspirin 325 MG tablet Take 1 tablet (325 mg total) by mouth daily.    . calcitRIOL (ROCALTROL) 0.25 MCG capsule Take 0.25 mcg by mouth 3 (three) times a week. Mondays, Wednesdays,Fridays    . cinacalcet (SENSIPAR) 30 MG tablet TAKE 1 TABLET BY MOUTH ONCE DAILY WITH  EVENING  MEAL    . cyclobenzaprine (FLEXERIL) 10 MG tablet Take 10 mg by mouth 3 (three) times daily as needed for muscle spasms.     Marland Kitchen DILT-XR 180 MG 24 hr capsule Take 1 capsule by mouth once daily 90 capsule 0  . docusate sodium (COLACE) 100 MG capsule Take 100 mg by mouth 2 (two) times daily. Constipation    . DULoxetine (CYMBALTA) 30 MG capsule Take 30 mg by mouth daily.    Marland Kitchen ezetimibe (ZETIA) 10 MG tablet Take 1 tablet (10 mg total) by mouth daily. 90 tablet 3  . gabapentin (NEURONTIN) 400 MG capsule Take 400 mg by mouth 3 (three) times daily.    Marland Kitchen HYDROcodone-acetaminophen (NORCO/VICODIN) 5-325 MG tablet One tablet every six hours for pain.  Limit 7 days. 28 tablet 0  . insulin NPH-regular Human (NOVOLIN 70/30 RELION) (70-30) 100 UNIT/ML injection Inject 210 Units into the skin daily with breakfast. 70 mL 11  . insulin regular (NOVOLIN R RELION) 100 units/mL injection Inject 0.8 mLs (80 Units total) into the skin daily with supper. 30 mL 11  . metolazone (ZAROXOLYN) 5 MG tablet Take 1 tablet by mouth once daily 90 tablet 0  . metoprolol tartrate (LOPRESSOR) 100 MG tablet TAKE 1 TABLET BY MOUTH TWICE DAILY 180 tablet 2  . midodrine (PROAMATINE) 5 MG tablet Take 5 mg by mouth 2 (two) times daily.     Glory Rosebush DELICA LANCETS 19Q MISC 1 Device by Does not apply route 2 (two) times daily. 60 each 11  . ONETOUCH ULTRA test strip USE 1 STRIP TO  CHECK GLUCOSE TWICE DAILY 100 each 0  . potassium chloride (K-DUR) 10 MEQ tablet TAKE 3 TABLETS BY MOUTH THREE TIMES DAILY 270 tablet 1  . pravastatin (PRAVACHOL) 20 MG tablet TAKE 1 TABLET BY MOUTH ONCE DAILY BEFORE BREAKFAST 90 tablet 1  . RELION INSULIN SYR 1CC/30G 30G X 5/16" 1 ML MISC USE ONE SYRINGE TO INJECT INSULIN SUBCUTANEOUSLY THREE TIMES DAILY AS DIRECTED 150 each 0  .  RELION INSULIN SYRINGE 31G X 15/64" 1 ML MISC USE 1 SYRINGE THREE TIMES DAILY AS DIRECTED 150 each 0  . spironolactone (ALDACTONE) 25 MG tablet TAKE 1 TABLET BY MOUTH ONCE DAILY 90 tablet 1  . tiZANidine (ZANAFLEX) 4 MG tablet One by mouth every night before bed as needed for spasm 30 tablet 3  . torsemide (DEMADEX) 20 MG tablet Take 80 mg by mouth daily.      No current facility-administered medications for this visit.      Physical Exam  Blood pressure (!) 144/69, pulse 62, temperature 98.1 F (36.7 C), height 5\' 3"  (1.6 m), weight 200 lb (90.7 kg).  Constitutional: overall normal hygiene, normal nutrition, well developed, normal grooming, normal body habitus. Assistive device:none  Musculoskeletal: gait and station Limp none, muscle tone and strength are normal, no tremors or atrophy is present.  .  Neurological: coordination overall normal.  Deep tendon reflex/nerve stretch intact.  Sensation normal.  Cranial nerves II-XII intact.   Skin:   Normal overall no scars, lesions, ulcers or rashes. No psoriasis.  Psychiatric: Alert and oriented x 3.  Recent memory intact, remote memory unclear.  Normal mood and affect. Well groomed.  Good eye contact.  Cardiovascular: overall no swelling, no varicosities, no edema bilaterally, normal temperatures of the legs and arms, no clubbing, cyanosis and good capillary refill.  Lymphatic: palpation is normal.  Spine/Pelvis examination:  Inspection:  Overall, sacoiliac joint benign and hips nontender; without crepitus or defects.   Thoracic spine inspection: Alignment  normal without kyphosis present   Lumbar spine inspection:  Alignment  with normal lumbar lordosis, without scoliosis apparent.   Thoracic spine palpation:  without tenderness of spinal processes   Lumbar spine palpation: without tenderness of lumbar area; without tightness of lumbar muscles    Range of Motion:   Lumbar flexion, forward flexion is normal without pain or tenderness    Lumbar extension is full without pain or tenderness   Left lateral bend is normal without pain or tenderness   Right lateral bend is normal without pain or tenderness   Straight leg raising is normal  Strength & tone: normal   Stability overall normal stability  All other systems reviewed and are negative   The patient has been educated about the nature of the problem(s) and counseled on treatment options.  The patient appeared to understand what I have discussed and is in agreement with it.  Encounter Diagnoses  Name Primary?  . Chronic left-sided low back pain with left-sided sciatica Yes  . 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 Hereditary and idiopathic peripheral neuropathy     PLAN Call if any problems.  Precautions discussed.  Continue current medications.   Return to clinic 1 month  I have reviewed the [place] web site prior to prescribing narcotic medicine for this patient.     Electronically Signed Sanjuana Kava, MD 6/30/202010:28 AM

## 2018-07-29 NOTE — Telephone Encounter (Signed)
No show, called and left message concerning missed apt today.  Included next apt scheduled time and date.  Mentioned the no-show policy as this is #1, encouraged to call and cancel/reschedule if unable to make next apt.  Also noticed in MD note earlier today she does not feel like she is making any improvements from PT.  Asked for pt to call and let us know if she wishes to attend last 3 sessions or to cancel.  [address] [place]; CBIS [phone]

## 2018-07-30 ENCOUNTER — Telehealth (HOSPITAL_COMMUNITY): Payer: Self-pay | Admitting: Family Medicine

## 2018-07-30 NOTE — Telephone Encounter (Signed)
07/30/18  pt left a message that Dr. Luna Glasgow changed her health plan and she won't be coming back to therapy

## 2018-07-31 ENCOUNTER — Ambulatory Visit (HOSPITAL_COMMUNITY): Payer: PPO

## 2018-07-31 ENCOUNTER — Encounter (HOSPITAL_COMMUNITY): Payer: Self-pay

## 2018-07-31 NOTE — Therapy (Signed)
[place], [place], [address] Phone: [phone]   Fax:  [phone]  Patient Details  Name: Krista Strickland MRN: [id number] Date of Birth: 07/21/1950 Referring Provider:  No ref. provider found  Encounter Date: 07/31/2018   PHYSICAL THERAPY DISCHARGE SUMMARY  Visits from Start of Care: 15  Current functional level related to goals / functional outcomes: See last treatment note   Remaining deficits: See last treatment note   Education / Equipment: N/A  Plan: Patient agrees to discharge.  Patient goals were partially met. Patient is being discharged due to the patient's request.  ?????     Per Lucien Mons: pt left a message that Dr. Luna Glasgow changed her health plan and she won't be coming back to therapy    Talbot Grumbling PT, DPT  [place], [place], [address] Phone: [phone]   Fax:  [phone]

## 2018-08-05 ENCOUNTER — Encounter (HOSPITAL_COMMUNITY): Payer: PPO

## 2018-08-07 ENCOUNTER — Encounter (HOSPITAL_COMMUNITY): Payer: PPO

## 2018-08-21 ENCOUNTER — Other Ambulatory Visit: Payer: Self-pay | Admitting: Family Medicine

## 2018-08-21 ENCOUNTER — Other Ambulatory Visit: Payer: Self-pay

## 2018-08-25 ENCOUNTER — Ambulatory Visit: Payer: PPO | Admitting: Endocrinology

## 2018-08-25 ENCOUNTER — Other Ambulatory Visit: Payer: Self-pay

## 2018-08-25 ENCOUNTER — Encounter: Payer: Self-pay | Admitting: Endocrinology

## 2018-08-25 VITALS — BP 124/60 | HR 67 | Temp 99.3°F | Ht 63.0 in | Wt 199.0 lb

## 2018-08-25 DIAGNOSIS — Z794 Long term (current) use of insulin: Secondary | ICD-10-CM | POA: Diagnosis not present

## 2018-08-25 DIAGNOSIS — E1140 Type 2 diabetes mellitus with diabetic neuropathy, unspecified: Secondary | ICD-10-CM | POA: Diagnosis not present

## 2018-08-25 LAB — POCT GLYCOSYLATED HEMOGLOBIN (HGB A1C): Hemoglobin A1C: 8.1 % — AB (ref 4.0–5.6)

## 2018-08-25 MED ORDER — NOVOLIN 70/30 RELION (70-30) 100 UNIT/ML ~~LOC~~ SUSP: 220.0000 [IU] | Freq: Every day | SUBCUTANEOUS | 11 refills | Status: DC

## 2018-08-25 MED ORDER — INSULIN REGULAR HUMAN 100 UNIT/ML IJ SOLN: 70.0000 [IU] | Freq: Every day | INTRAMUSCULAR | 11 refills | Status: DC

## 2018-08-25 NOTE — Progress Notes (Signed)
Subjective:    Patient ID: Krista Strickland, female    DOB: 1950-07-28, 68 y.o.   MRN: [id number]  HPI Pt returns for f/u of diabetes.   DM type: insulin-requiring type 2 Dx'ed: 3335 Complications: polyneuropathy, renal insufficiency and CVA.  Therapy: insulin since 2010 GDM: never DKA: never Severe hypoglycemia: never.   Pancreatitis: never.   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 she cannot afford analogs, so we had to try 70/30 (the NPH component caused no reaction this time).  She takes 70/30 QAM and reg QPM, due to the pattern of cbg's.   Interval history:  no cbg record, but states cbg's vary from 70-400.  It is in general higher as the day goes on. pt states she feels well in general.  She says she never misses the insulin.   Pt also has small multinodular goiter (euthyroid; f/u US in 2017 showed no change, with no thyroid nodule meeting criteria for biopsy or surveillance; she is euthyroid off rx).   Past Medical History:  Diagnosis Date  . Arthritis   . Cancer ([place])    uterine  . Chronic kidney disease   . Coronary artery disease   . Diabetes mellitus, type 2 ([place])   . GERD (gastroesophageal reflux disease)    no medication in 2017  . Gout   . History of MRSA infection 03/2009   pt denies this  . Hypercalcemia 2017   managed by nephrology  . Hyperlipidemia   . Hypertension   . Obesity   . Psoriasis   . Stroke [facility]) 2013   "light"    Past Surgical History:  Procedure Laterality Date  . ABDOMINAL HYSTERECTOMY    . APPENDECTOMY  2012  . COLONOSCOPY WITH PROPOFOL N/A 08/24/2016   Procedure: COLONOSCOPY WITH PROPOFOL;  Surgeon: Rogene Houston, MD;  [facility];  Service: Endoscopy;  Laterality: N/A;  10:30  . excison of rt breast cyst    . LAPAROSCOPIC SALPINGO OOPHERECTOMY Right 04/22/2012   Procedure: LAPAROSCOPIC SALPINGO OOPHORECTOMY;  Surgeon: Jonnie Kind, MD;  Location: [facility] OR[facility];  Service: Gynecology;  Laterality: Right;  end 11:17  . MASS EXCISION N/A 04/22/2012   Procedure: EXCISION SKIN TAGS NECK AND HEAD;  Surgeon: Jonnie Kind, MD;  Location: [facility];  Service: Gynecology;  Laterality: N/A;  start 11:19  . PARTIAL HYSTERECTOMY    . POLYPECTOMY  08/24/2016   Procedure: POLYPECTOMY;  Surgeon: Rogene Houston, MD;  Location[facility];  Service: Endoscopy;;  colon  . rt,. neck biopsy      Social History   Socioeconomic History  . Marital status: Married    Spouse name: Not on file  . Number of children: 1  . Years of education: Not on file  . Highest education level: 11th grade  Occupational History  . Occupation: disabled     Fish farm manager: UNEMPLOYED  Social Needs  . Financial resource strain: Not very hard  . Food insecurity    Worry: Never true    Inability: Never true  . Transportation needs    Medical: No    Non-medical: No  Tobacco Use  . Smoking status: Former Smoker    Packs/day: 0.25    Years: 12.00    Pack years: 3.00    Quit date: 07/16/1978    Years since quitting: 40.1  . Smokeless tobacco: Never Used  Substance and Sexual  Activity  . Alcohol use: No  . Drug use: No  . Sexual activity: Yes    Birth control/protection: Surgical  Lifestyle  . Physical activity    Days per week: 0 days    Minutes per session: 0 min  . Stress: Not at all  Relationships  . Social Herbalist on phone: Twice a week    Gets together: Once a week    Attends religious service: More than 4 times per year    Active member of club or organization: No    Attends meetings of clubs or organizations: Never    Relationship status: Married  . Intimate partner violence    Fear of current or ex partner: No    Emotionally abused: No    Physically abused: No    Forced sexual activity: No  Other Topics Concern  . Not on file  Social History Narrative  . Not on file    Current Outpatient Medications on File  Prior to Visit  Medication Sig Dispense Refill  . allopurinol (ZYLOPRIM) 300 MG tablet Take 1 tablet (300 mg total) by mouth daily. 30 tablet 5  . aspirin 325 MG tablet Take 1 tablet (325 mg total) by mouth daily.    . calcitRIOL (ROCALTROL) 0.25 MCG capsule Take 0.25 mcg by mouth 3 (three) times a week. Mondays, Wednesdays,Fridays    . cinacalcet (SENSIPAR) 30 MG tablet TAKE 1 TABLET BY MOUTH ONCE DAILY WITH  EVENING  MEAL    . cyclobenzaprine (FLEXERIL) 10 MG tablet Take 10 mg by mouth 3 (three) times daily as needed for muscle spasms.     Marland Kitchen DILT-XR 180 MG 24 hr capsule Take 1 capsule by mouth once daily 90 capsule 0  . docusate sodium (COLACE) 100 MG capsule Take 100 mg by mouth 2 (two) times daily. Constipation    . DULoxetine (CYMBALTA) 30 MG capsule Take 30 mg by mouth daily.    Marland Kitchen ezetimibe (ZETIA) 10 MG tablet Take 1 tablet (10 mg total) by mouth daily. 90 tablet 3  . gabapentin (NEURONTIN) 400 MG capsule Take 400 mg by mouth 3 (three) times daily.    . metolazone (ZAROXOLYN) 5 MG tablet Take 1 tablet by mouth once daily 90 tablet 0  . metoprolol tartrate (LOPRESSOR) 100 MG tablet TAKE 1 TABLET BY MOUTH TWICE DAILY 180 tablet 2  . midodrine (PROAMATINE) 5 MG tablet Take 5 mg by mouth 2 (two) times daily.     Glory Rosebush DELICA LANCETS 74J MISC 1 Device by Does not apply route 2 (two) times daily. 60 each 11  . ONETOUCH ULTRA test strip USE 1 STRIP TO CHECK GLUCOSE TWICE DAILY 100 each 0  . potassium chloride (K-DUR) 10 MEQ tablet TAKE 3 TABLETS BY MOUTH THREE TIMES DAILY 270 tablet 1  . pravastatin (PRAVACHOL) 20 MG tablet TAKE 1 TABLET BY MOUTH ONCE DAILY BEFORE BREAKFAST 90 tablet 1  . RELION INSULIN SYR 1CC/30G 30G X 5/16" 1 ML MISC USE ONE SYRINGE TO INJECT INSULIN SUBCUTANEOUSLY THREE TIMES DAILY AS DIRECTED 150 each 0  . RELION INSULIN SYRINGE 31G X 15/64" 1 ML MISC USE 1 SYRINGE THREE TIMES DAILY AS DIRECTED 150 each 0  . spironolactone (ALDACTONE) 25 MG tablet TAKE 1 TABLET BY  MOUTH ONCE DAILY 90 tablet 1  . tiZANidine (ZANAFLEX) 4 MG tablet One by mouth every night before bed as needed for spasm 30 tablet 3  . torsemide (DEMADEX) 20 MG tablet Take 80 mg  by mouth daily.      No current facility-administered medications on file prior to visit.     Allergies  Allergen Reactions  . Benazepril Swelling  . Metronidazole Hives  . Mobic [Meloxicam] Hives  . Penicillins Hives and Swelling    Has patient had a PCN reaction causing immediate rash, facial/tongue/throat swelling, SOB or lightheadedness with hypotension: No Has patient had a PCN reaction causing severe rash involving mucus membranes or skin necrosis: Yes Has patient had a PCN reaction that required hospitalization: No Has patient had a PCN reaction occurring within the last 10 years: No If all of the above answers are "NO", then may proceed with Cephalosporin use.   . Sulfonamide Derivatives Hives    Family History  Problem Relation Age of Onset  . Hypertension Brother   . Gout Brother   . Prostate cancer Brother   . Hypertension Brother   . Prostate cancer Brother   . Gout Brother   . Hypertension Sister   . Gout Sister   . Cancer Sister 48       pancreatic   . Leukemia Sister 60  . Gout Sister   . Prostate cancer Brother   . Diabetes Neg Hx     BP 124/60 (BP Location: Right Arm, Patient Position: Sitting, Cuff Size: Large)   Pulse 67   Temp 99.3 F (37.4 C) (Oral)   Ht 5\' 3"  (1.6 m)   Wt 199 lb (90.3 kg)   SpO2 95%   BMI 35.25 kg/m    Review of Systems Denies LOC    Objective:   Physical Exam VITAL SIGNS:  See vs page GENERAL: no distress Pulses: dorsalis pedis intact bilat.   MSK: no deformity of the feet CV: 1+ bilat leg edema Skin:  no ulcer on the feet.  normal color and temp on the feet. Neuro: sensation is intact to touch on the feet.   Ext: several ingrown toenails, but no erythema/drainage/swelling  Lab Results  Component Value Date   HGBA1C 8.1 (A)  08/25/2018    Lab Results  Component Value Date   CREATININE 2.39 (H) 09/07/2017   BUN 86 (HH) 09/07/2017   NA 139 09/07/2017   K 4.1 09/07/2017   CL 96 09/07/2017   CO2 22 09/07/2017       Assessment & Plan:  Insulin-requiring type 2 DM, with CVA: Based on the pattern of her cbg's, she needs some adjustment in her therapy. Renal failure: in this setting, she should have most of her insulin in the morning. Edema: This limits rx options   Patient Instructions  check your blood sugar twice a day.  vary the time of day when you check, between before the 3 meals, and at bedtime.  also check if you have symptoms of your blood sugar being too high or too low.  please keep a record of the readings and bring it to your next appointment here.  You can write it on any piece of paper.  please call us sooner if your blood sugar goes below 70, or if you have a lot of readings over 200.   Please increase the 70/30 insulin to 220 units with breakfast.  However, take just 90 units on Sunday morning, and:  Please reduce the regular insulin to 70 units with supper.    On this type of insulin schedule, you should eat meals on a regular schedule.  If a meal is missed or significantly delayed, your blood sugar could go  low.   Please come back for a follow-up appointment in 2 months.

## 2018-08-25 NOTE — Patient Instructions (Addendum)
check your blood sugar twice a day.  vary the time of day when you check, between before the 3 meals, and at bedtime.  also check if you have symptoms of your blood sugar being too high or too low.  please keep a record of the readings and bring it to your next appointment here.  You can write it on any piece of paper.  please call us sooner if your blood sugar goes below 70, or if you have a lot of readings over 200.   Please increase the 70/30 insulin to 220 units with breakfast.  However, take just 90 units on Sunday morning, and:  Please reduce the regular insulin to 70 units with supper.    On this type of insulin schedule, you should eat meals on a regular schedule.  If a meal is missed or significantly delayed, your blood sugar could go low.   Please come back for a follow-up appointment in 2 months.

## 2018-08-26 ENCOUNTER — Ambulatory Visit (INDEPENDENT_AMBULATORY_CARE_PROVIDER_SITE_OTHER): Payer: PPO | Admitting: Orthopaedic Surgery

## 2018-08-26 ENCOUNTER — Encounter: Payer: Self-pay | Admitting: Orthopaedic Surgery

## 2018-08-26 VITALS — BP 147/58 | HR 62 | Temp 97.3°F | Ht 63.0 in | Wt 199.0 lb

## 2018-08-26 DIAGNOSIS — G8929 Other chronic pain: Secondary | ICD-10-CM | POA: Diagnosis not present

## 2018-08-26 DIAGNOSIS — E0849 Diabetes mellitus due to underlying condition with other diabetic neurological complication: Secondary | ICD-10-CM | POA: Diagnosis not present

## 2018-08-26 DIAGNOSIS — M5442 Lumbago with sciatica, left side: Secondary | ICD-10-CM | POA: Diagnosis not present

## 2018-08-26 MED ORDER — HYDROCODONE-ACETAMINOPHEN 5-325 MG PO TABS: ORAL_TABLET | ORAL | 0 refills | Status: DC

## 2018-08-26 NOTE — Progress Notes (Signed)
Patient Krista Strickland, female DOB:1950-08-30, 68 y.o. QQP:[id number]  Chief Complaint  Patient presents with  . Back Pain  . Medication Refill    HPI  [name] is a 68 y.o. female who has chronic lower back pain. She has had more pain recently. She has no paresthesias today. She has no weakness.  She is doing her activities and exercises.  Her A1C is 8.1   Body mass index is 35.25 kg/m.  ROS  Review of Systems  Constitutional: Positive for activity change.  Musculoskeletal: Positive for arthralgias, back pain, gait problem and joint swelling.  All other systems reviewed and are negative.   All other systems reviewed and are negative.  The following is a summary of the past history medically, past history surgically, known current medicines, social history and family history.  This information is gathered electronically by the computer from prior information and documentation.  I review this each visit and have found including this information at this point in the chart is beneficial and informative.    Past Medical History:  Diagnosis Date  . Arthritis   . Cancer ([place])    uterine  . Chronic kidney disease   . Coronary artery disease   . Diabetes mellitus, type 2 ([place])   . GERD (gastroesophageal reflux disease)    no medication in 2017  . Gout   . History of MRSA infection 03/2009   pt denies this  . Hypercalcemia 2017   managed by nephrology  . Hyperlipidemia   . Hypertension   . Obesity   . Psoriasis   . Stroke [facility]) 2013   "light"    Past Surgical History:  Procedure Laterality Date  . ABDOMINAL HYSTERECTOMY    . APPENDECTOMY  2012  . COLONOSCOPY WITH PROPOFOL N/A 08/24/2016   Procedure: COLONOSCOPY WITH PROPOFOL;  Surgeon: Rogene Houston, MD;  Location[facility];  Service: Endoscopy;  Laterality: N/A;  10:30  . excison of rt breast cyst    . LAPAROSCOPIC SALPINGO OOPHERECTOMY Right 04/22/2012   Procedure: LAPAROSCOPIC SALPINGO  OOPHORECTOMY;  Surgeon: Jonnie Kind, MD;  Location: [facility] ORS;  Service: Gynecology;  Laterality: Right;  end 11:17  . MASS EXCISION N/A 04/22/2012   Procedure: EXCISION SKIN TAGS NECK AND HEAD;  Surgeon: Jonnie Kind, MD;  Location: [facility] ORS;  Service: Gynecology;  Laterality: N/A;  start 11:19  . PARTIAL HYSTERECTOMY    . POLYPECTOMY  08/24/2016   Procedure: POLYPECTOMY;  Surgeon: Rogene Houston, MD;  Location[facility];  Service: Endoscopy;;  colon  . rt,. neck biopsy      Family History  Problem Relation Age of Onset  . Hypertension Brother   . Gout Brother   . Prostate cancer Brother   . Hypertension Brother   . Prostate cancer Brother   . Gout Brother   . Hypertension Sister   . Gout Sister   . Cancer Sister 16       pancreatic   . Leukemia Sister 25  . Gout Sister   . Prostate cancer Brother   . Diabetes Neg Hx     Social History Social History   Tobacco Use  . Smoking status: Former Smoker    Packs/day: 0.25    Years: 12.00    Pack years: 3.00    Quit date: 07/16/1978    Years since quitting: 40.1  . Smokeless tobacco: Never Used  Substance Use Topics  . Alcohol use: No  . Drug  use: No    Allergies  Allergen Reactions  . Benazepril Swelling  . Metronidazole Hives  . Mobic [Meloxicam] Hives  . Penicillins Hives and Swelling    Has patient had a PCN reaction causing immediate rash, facial/tongue/throat swelling, SOB or lightheadedness with hypotension: No Has patient had a PCN reaction causing severe rash involving mucus membranes or skin necrosis: Yes Has patient had a PCN reaction that required hospitalization: No Has patient had a PCN reaction occurring within the last 10 years: No If all of the above answers are "NO", then may proceed with Cephalosporin use.   . Sulfonamide Derivatives Hives    Current Outpatient Medications  Medication Sig Dispense Refill  . allopurinol (ZYLOPRIM) 300 MG tablet Take 1 tablet (300 mg total) by mouth daily.  30 tablet 5  . aspirin 325 MG tablet Take 1 tablet (325 mg total) by mouth daily.    . calcitRIOL (ROCALTROL) 0.25 MCG capsule Take 0.25 mcg by mouth 3 (three) times a week. Mondays, Wednesdays,Fridays    . cinacalcet (SENSIPAR) 30 MG tablet TAKE 1 TABLET BY MOUTH ONCE DAILY WITH  EVENING  MEAL    . cyclobenzaprine (FLEXERIL) 10 MG tablet Take 10 mg by mouth 3 (three) times daily as needed for muscle spasms.     Marland Kitchen DILT-XR 180 MG 24 hr capsule Take 1 capsule by mouth once daily 90 capsule 0  . docusate sodium (COLACE) 100 MG capsule Take 100 mg by mouth 2 (two) times daily. Constipation    . DULoxetine (CYMBALTA) 30 MG capsule Take 30 mg by mouth daily.    Marland Kitchen ezetimibe (ZETIA) 10 MG tablet Take 1 tablet (10 mg total) by mouth daily. 90 tablet 3  . gabapentin (NEURONTIN) 400 MG capsule Take 400 mg by mouth 3 (three) times daily.    Marland Kitchen HYDROcodone-acetaminophen (NORCO/VICODIN) 5-325 MG tablet One tablet every six hours for pain.  Limit 7 days. 28 tablet 0  . insulin NPH-regular Human (NOVOLIN 70/30 RELION) (70-30) 100 UNIT/ML injection Inject 220 Units into the skin daily with breakfast. 70 mL 11  . insulin regular (NOVOLIN R RELION) 100 units/mL injection Inject 0.7 mLs (70 Units total) into the skin daily with supper. 30 mL 11  . metolazone (ZAROXOLYN) 5 MG tablet Take 1 tablet by mouth once daily 90 tablet 0  . metoprolol tartrate (LOPRESSOR) 100 MG tablet TAKE 1 TABLET BY MOUTH TWICE DAILY 180 tablet 2  . midodrine (PROAMATINE) 5 MG tablet Take 5 mg by mouth 2 (two) times daily.     Glory Rosebush DELICA LANCETS 30N MISC 1 Device by Does not apply route 2 (two) times daily. 60 each 11  . ONETOUCH ULTRA test strip USE 1 STRIP TO CHECK GLUCOSE TWICE DAILY 100 each 0  . potassium chloride (K-DUR) 10 MEQ tablet TAKE 3 TABLETS BY MOUTH THREE TIMES DAILY 270 tablet 1  . pravastatin (PRAVACHOL) 20 MG tablet TAKE 1 TABLET BY MOUTH ONCE DAILY BEFORE BREAKFAST 90 tablet 1  . RELION INSULIN SYR 1CC/30G 30G X  5/16" 1 ML MISC USE ONE SYRINGE TO INJECT INSULIN SUBCUTANEOUSLY THREE TIMES DAILY AS DIRECTED 150 each 0  . RELION INSULIN SYRINGE 31G X 15/64" 1 ML MISC USE 1 SYRINGE THREE TIMES DAILY AS DIRECTED 150 each 0  . spironolactone (ALDACTONE) 25 MG tablet TAKE 1 TABLET BY MOUTH ONCE DAILY 90 tablet 1  . tiZANidine (ZANAFLEX) 4 MG tablet One by mouth every night before bed as needed for spasm 30 tablet 3  .  torsemide (DEMADEX) 20 MG tablet Take 80 mg by mouth daily.      No current facility-administered medications for this visit.      Physical Exam  Blood pressure (!) 147/58, pulse 62, height 5\' 3"  (1.6 m), weight 199 lb (90.3 kg).  Constitutional: overall normal hygiene, normal nutrition, well developed, normal grooming, normal body habitus. Assistive device:none  Musculoskeletal: gait and station Limp none, muscle tone and strength are normal, no tremors or atrophy is present.  .  Neurological: coordination overall normal.  Deep tendon reflex/nerve stretch intact.  Sensation normal.  Cranial nerves II-XII intact.   Skin:   Normal overall no scars, lesions, ulcers or rashes. No psoriasis.  Psychiatric: Alert and oriented x 3.  Recent memory intact, remote memory unclear.  Normal mood and affect. Well groomed.  Good eye contact.  Cardiovascular: overall no swelling, no varicosities, no edema bilaterally, normal temperatures of the legs and arms, no clubbing, cyanosis and good capillary refill.  Lymphatic: palpation is normal.  Spine/Pelvis examination:  Inspection:  Overall, sacoiliac joint benign and hips nontender; without crepitus or defects.   Thoracic spine inspection: Alignment normal without kyphosis present   Lumbar spine inspection:  Alignment  with normal lumbar lordosis, without scoliosis apparent.   Thoracic spine palpation:  without tenderness of spinal processes   Lumbar spine palpation: without tenderness of lumbar area; without tightness of lumbar muscles    Range  of Motion:   Lumbar flexion, forward flexion is normal without pain or tenderness    Lumbar extension is full without pain or tenderness   Left lateral bend is normal without pain or tenderness   Right lateral bend is normal without pain or tenderness   Straight leg raising is normal  Strength & tone: normal   Stability overall normal stability  All other systems reviewed and are negative   The patient has been educated about the nature of the problem(s) and counseled on treatment options.  The patient appeared to understand what I have discussed and is in agreement with it.  Encounter Diagnoses  Name Primary?  . Chronic left-sided low back pain with left-sided sciatica Yes  . 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  Precautions discussed.  Continue current medications.   Return to clinic 2 months   I have reviewed the [place] web site prior to prescribing narcotic medicine for this patient.   Electronically Signed Sanjuana Kava, MD 7/28/202010:12 AM

## 2018-09-02 ENCOUNTER — Other Ambulatory Visit: Payer: Self-pay | Admitting: Family Medicine

## 2018-09-02 ENCOUNTER — Other Ambulatory Visit: Payer: Self-pay | Admitting: Orthopaedic Surgery

## 2018-09-03 ENCOUNTER — Encounter (HOSPITAL_COMMUNITY): Payer: Self-pay

## 2018-09-03 ENCOUNTER — Emergency Department (HOSPITAL_COMMUNITY)
Admission: EM | Admit: 2018-09-03 | Discharge: 2018-09-03 | Disposition: A | Discharge status: Home / Self Care | Payer: PPO | Attending: Emergency Medicine | Admitting: Emergency Medicine

## 2018-09-03 ENCOUNTER — Ambulatory Visit (INDEPENDENT_AMBULATORY_CARE_PROVIDER_SITE_OTHER): Payer: PPO | Admitting: Family Medicine

## 2018-09-03 ENCOUNTER — Encounter: Payer: Self-pay | Admitting: Family Medicine

## 2018-09-03 ENCOUNTER — Other Ambulatory Visit: Payer: Self-pay

## 2018-09-03 ENCOUNTER — Encounter (INDEPENDENT_AMBULATORY_CARE_PROVIDER_SITE_OTHER): Payer: Self-pay

## 2018-09-03 VITALS — BP 145/67 | HR 68 | Temp 99.6°F | Resp 12 | Ht 63.0 in | Wt 189.0 lb

## 2018-09-03 DIAGNOSIS — G245 Blepharospasm: Secondary | ICD-10-CM | POA: Diagnosis not present

## 2018-09-03 DIAGNOSIS — N183 Chronic kidney disease, stage 3 (moderate): Secondary | ICD-10-CM | POA: Diagnosis not present

## 2018-09-03 DIAGNOSIS — Z87891 Personal history of nicotine dependence: Secondary | ICD-10-CM | POA: Diagnosis not present

## 2018-09-03 DIAGNOSIS — H11432 Conjunctival hyperemia, left eye: Secondary | ICD-10-CM | POA: Insufficient documentation

## 2018-09-03 DIAGNOSIS — E1122 Type 2 diabetes mellitus with diabetic chronic kidney disease: Secondary | ICD-10-CM | POA: Insufficient documentation

## 2018-09-03 DIAGNOSIS — H5789 Other specified disorders of eye and adnexa: Secondary | ICD-10-CM

## 2018-09-03 DIAGNOSIS — Z79899 Other long term (current) drug therapy: Secondary | ICD-10-CM | POA: Insufficient documentation

## 2018-09-03 DIAGNOSIS — I259 Chronic ischemic heart disease, unspecified: Secondary | ICD-10-CM | POA: Diagnosis not present

## 2018-09-03 DIAGNOSIS — H5712 Ocular pain, left eye: Secondary | ICD-10-CM | POA: Diagnosis not present

## 2018-09-03 DIAGNOSIS — Z794 Long term (current) use of insulin: Secondary | ICD-10-CM | POA: Diagnosis not present

## 2018-09-03 DIAGNOSIS — H53142 Visual discomfort, left eye: Secondary | ICD-10-CM | POA: Diagnosis not present

## 2018-09-03 DIAGNOSIS — I129 Hypertensive chronic kidney disease with stage 1 through stage 4 chronic kidney disease, or unspecified chronic kidney disease: Secondary | ICD-10-CM | POA: Diagnosis not present

## 2018-09-03 MED ORDER — TOBRAMYCIN-DEXAMETHASONE 0.3-0.1 % OP SUSP
2.0000 [drp] | OPHTHALMIC | Status: DC
  Filled 2018-09-03: qty 2.5

## 2018-09-03 MED ORDER — FLUORESCEIN SODIUM 1 MG OP STRP
1.0000 | ORAL_STRIP | Freq: Once | OPHTHALMIC | Status: AC
  Administered 2018-09-03: 1 via OPHTHALMIC
  Filled 2018-09-03: qty 1

## 2018-09-03 MED ORDER — TOBRAMYCIN-DEXAMETHASONE 0.3-0.1 % OP SUSP: 2.0000 [drp] | OPHTHALMIC | 0 refills | Status: DC

## 2018-09-03 MED ORDER — TETRACAINE HCL 0.5 % OP SOLN
2.0000 [drp] | Freq: Once | OPHTHALMIC | Status: AC
  Administered 2018-09-03: 2 [drp] via OPHTHALMIC
  Filled 2018-09-03: qty 4

## 2018-09-03 NOTE — Discharge Instructions (Addendum)
You were seen in the ER for left eye redness.  Exam in the ER was reassuring.  You did not have any abrasions of your cornea.  Your eye pressure was normal.  Your vision was slightly decreased.  I spoke to Dr. Alanda Slim who is an ophthalmologist (eye doctor).  He suspects that this is due to inflammation in the eye.  He recommends eyedrops.  Use 2 drops of TobraDex in the left eye every 4 hours starting today.  Keep the eye protected from the light, you may want to wear sunglasses.  You have an appointment with him tomorrow at noon.  See his contact information and address below.  Return to the ER for fever greater than 100, swelling redness warmth or pus from the eye, sudden loss of vision, headache

## 2018-09-03 NOTE — Patient Instructions (Signed)
    Thank you for coming into the office today. I appreciate the opportunity to provide you with the care for your health and wellness. Today we discussed: eye pain and redness  Sent to ED Follow with [place] in 1 week pending ED findings.  Please continue to practice social distancing to keep you, your family, and our community safe.  If you must go out, please wear a Mask and practice good handwashing.  [place] YOUR HANDS WELL AND FREQUENTLY. AVOID TOUCHING YOUR FACE, UNLESS YOUR HANDS ARE FRESHLY WASHED.  GET FRESH AIR DAILY. STAY HYDRATED WITH WATER.   It was a pleasure to see you and I look forward to continuing to work together on your health and well-being. Please do not hesitate to call the office if you need care or have questions about your care.  Have a wonderful day and week.  With Gratitude,  Cherly Beach, DNP, AGNP-BC

## 2018-09-03 NOTE — Progress Notes (Signed)
Subjective:     Patient ID: Krista Strickland, female   DOB: 05/30/1950, 68 y.o.   MRN: [id number]  Krista Strickland presents for Eye Pain (left eye RED and painful since Monday)  Krista Strickland woke up Monday morning with a painful and red left eye. She reports association of blurred vision, light sensitivity, mild pain with EOM, pain with lifting head. Reports trying some tylenol with mild relief. Pain is 6/10 today in office.  She denies itching, matting in the mornings, being exposed to pink eye, allergies, sinus signs or ear pain. Denies a headache or sore throat. No cough or breathing trouble. Denies trauma, injury, or rubbing of the eye prior to event. Denies seeing dark spots or curtain closing.  Today patient denies signs and symptoms of COVID 19 infection including fevers, chills, cough, shortness of breath, and headache.  Past Medical, Surgical, Social History, Allergies, and Medications have been Reviewed  Past Medical History:  Diagnosis Date  . Arthritis   . Cancer ([place])    uterine  . Chronic kidney disease   . Coronary artery disease   . Diabetes mellitus, type 2 ([place])   . GERD (gastroesophageal reflux disease)    no medication in 2017  . Gout   . History of MRSA infection 03/2009   pt denies this  . Hypercalcemia 2017   managed by nephrology  . Hyperlipidemia   . Hypertension   . Obesity   . Psoriasis   . Stroke [facility]) 2013   "light"   Past Surgical History:  Procedure Laterality Date  . ABDOMINAL HYSTERECTOMY    . APPENDECTOMY  2012  . COLONOSCOPY WITH PROPOFOL N/A 08/24/2016   Procedure: COLONOSCOPY WITH PROPOFOL;  Surgeon: Rogene Houston, MD;  Location[facility] END[facility];  Service: Endoscopy;  Laterality: N/A;  10:30  . excison of rt breast cyst    . LAPAROSCOPIC SALPINGO OOPHERECTOMY Right 04/22/2012   Procedure: LAPAROSCOPIC SALPINGO OOPHORECTOMY;  Surgeon: Jonnie Kind, MD;  Location: [facility] ORS;  Service: Gynecology;  Laterality: Right;  end 11:17  . MASS  EXCISION N/A 04/22/2012   Procedure: EXCISION SKIN TAGS NECK AND HEAD;  Surgeon: Jonnie Kind, MD;  Location: [facility] ORS;  Service: Gynecology;  Laterality: N/A;  start 11:19  . PARTIAL HYSTERECTOMY    . POLYPECTOMY  08/24/2016   Procedure: POLYPECTOMY;  Surgeon: Rogene Houston, MD;  [facility];  Service: Endoscopy;;  colon  . rt,. neck biopsy     Social History   Socioeconomic History  . Marital status: Married    Spouse name: Not on file  . Number of children: 1  . Years of education: Not on file  . Highest education level: 11th grade  Occupational History  . Occupation: disabled     Fish farm manager: UNEMPLOYED  Social Needs  . Financial resource strain: Not very hard  . Food insecurity    Worry: Never true    Inability: Never true  . Transportation needs    Medical: No    Non-medical: No  Tobacco Use  . Smoking status: Former Smoker    Packs/day: 0.25    Years: 12.00    Pack years: 3.00    Quit date: 07/16/1978    Years since quitting: 40.1  . Smokeless tobacco: Never Used  Substance and Sexual Activity  . Alcohol use: No  . Drug use: No  . Sexual activity: Yes    Birth control/protection: Surgical  Lifestyle  . Physical activity  Days per week: 0 days    Minutes per session: 0 min  . Stress: Not at all  Relationships  . Social Herbalist on phone: Twice a week    Gets together: Once a week    Attends religious service: More than 4 times per year    Active member of club or organization: No    Attends meetings of clubs or organizations: Never    Relationship status: Married  . Intimate partner violence    Fear of current or ex partner: No    Emotionally abused: No    Physically abused: No    Forced sexual activity: No  Other Topics Concern  . Not on file  Social History Narrative  . Not on file    Outpatient Encounter Medications as of 09/03/2018  Medication Sig  . allopurinol (ZYLOPRIM) 300 MG tablet Take 1 tablet by mouth once daily   . aspirin 325 MG tablet Take 1 tablet (325 mg total) by mouth daily.  . calcitRIOL (ROCALTROL) 0.25 MCG capsule Take 0.25 mcg by mouth 3 (three) times a week. Mondays, Wednesdays,Fridays  . cinacalcet (SENSIPAR) 30 MG tablet TAKE 1 TABLET BY MOUTH ONCE DAILY WITH  EVENING  MEAL  . cyclobenzaprine (FLEXERIL) 10 MG tablet Take 10 mg by mouth 3 (three) times daily as needed for muscle spasms.   Marland Kitchen DILT-XR 180 MG 24 hr capsule Take 1 capsule by mouth once daily  . docusate sodium (COLACE) 100 MG capsule Take 100 mg by mouth 2 (two) times daily. Constipation  . DULoxetine (CYMBALTA) 30 MG capsule Take 30 mg by mouth daily.  Marland Kitchen ezetimibe (ZETIA) 10 MG tablet Take 1 tablet (10 mg total) by mouth daily.  Marland Kitchen gabapentin (NEURONTIN) 400 MG capsule Take 400 mg by mouth 3 (three) times daily.  Marland Kitchen HYDROcodone-acetaminophen (NORCO/VICODIN) 5-325 MG tablet One tablet every six hours for pain.  Limit 7 days.  . insulin NPH-regular Human (NOVOLIN 70/30 RELION) (70-30) 100 UNIT/ML injection Inject 220 Units into the skin daily with breakfast.  . insulin regular (NOVOLIN R RELION) 100 units/mL injection Inject 0.7 mLs (70 Units total) into the skin daily with supper.  . metolazone (ZAROXOLYN) 5 MG tablet Take 1 tablet by mouth once daily  . metoprolol tartrate (LOPRESSOR) 100 MG tablet Take 1 tablet by mouth twice daily  . midodrine (PROAMATINE) 5 MG tablet Take 5 mg by mouth 2 (two) times daily.   Krista Strickland DELICA LANCETS 50K MISC 1 Device by Does not apply route 2 (two) times daily.  Krista Strickland ULTRA test strip USE 1 STRIP TO CHECK GLUCOSE TWICE DAILY  . potassium chloride (K-DUR) 10 MEQ tablet TAKE 3 TABLETS BY MOUTH THREE TIMES DAILY  . pravastatin (PRAVACHOL) 20 MG tablet TAKE 1 TABLET BY MOUTH ONCE DAILY BEFORE BREAKFAST  . RELION INSULIN SYR 1CC/30G 30G X 5/16" 1 ML MISC USE ONE SYRINGE TO INJECT INSULIN SUBCUTANEOUSLY THREE TIMES DAILY AS DIRECTED  . RELION INSULIN SYRINGE 31G X 15/64" 1 ML MISC USE 1 SYRINGE  THREE TIMES DAILY AS DIRECTED  . spironolactone (ALDACTONE) 25 MG tablet TAKE 1 TABLET BY MOUTH ONCE DAILY  . tiZANidine (ZANAFLEX) 4 MG tablet One by mouth every night before bed as needed for spasm  . torsemide (DEMADEX) 20 MG tablet Take 80 mg by mouth daily.    No facility-administered encounter medications on file as of 09/03/2018.    Allergies  Allergen Reactions  . Benazepril Swelling  . Metronidazole Hives  .  Mobic [Meloxicam] Hives  . Penicillins Hives and Swelling    Has patient had a PCN reaction causing immediate rash, facial/tongue/throat swelling, SOB or lightheadedness with hypotension: No Has patient had a PCN reaction causing severe rash involving mucus membranes or skin necrosis: Yes Has patient had a PCN reaction that required hospitalization: No Has patient had a PCN reaction occurring within the last 10 years: No If all of the above answers are "NO", then may proceed with Cephalosporin use.   . Sulfonamide Derivatives Hives    Review of Systems  Constitutional: Negative for chills and fever.  HENT: Negative.   Eyes: Positive for photophobia, pain, redness and visual disturbance. Negative for discharge and itching.  Respiratory: Negative.   Cardiovascular: Negative.   Gastrointestinal: Negative.   Endocrine: Negative.   Genitourinary: Negative.   Musculoskeletal: Negative.   Skin: Negative.   Allergic/Immunologic: Negative.   Neurological: Negative.  Negative for dizziness and headaches.  Hematological: Negative.   Psychiatric/Behavioral: Negative.   All other systems reviewed and are negative.      Objective:     BP (!) 145/67   Pulse 68   Temp 99.6 F (37.6 C) (Oral)   Resp 12   Ht 5\' 3"  (1.6 m)   Wt 189 lb (85.7 kg)   SpO2 97%   BMI 33.48 kg/m   Physical Exam Vitals signs and nursing note reviewed.  Constitutional:      Appearance: Normal appearance. She is well-developed and well-groomed. She is obese.  HENT:     Head: Normocephalic  and atraumatic.     Right Ear: External ear normal.     Left Ear: External ear normal.     Nose: Nose normal.     Mouth/Throat:     Mouth: Mucous membranes are moist.     Pharynx: Oropharynx is clear.  Eyes:     General: Visual field deficit present.        Right eye: No discharge.        Left eye: No discharge.     Extraocular Movements: Extraocular movements intact.     Conjunctiva/sclera:     Left eye: Left conjunctiva is injected.     Comments: Erythema of Left eye, mild swelling of upper lid, tenderness to palpation, injected, photophobia, Corneal is cloudy. Difficulty testing pupil and EOM Mild proptosis (could be swelling related)  Right eye is having lower lid-blepharospasm   Neck:     Musculoskeletal: Normal range of motion and neck supple.  Cardiovascular:     Rate and Rhythm: Normal rate and regular rhythm.     Pulses: Normal pulses.     Heart sounds: Normal heart sounds.  Pulmonary:     Effort: Pulmonary effort is normal.     Breath sounds: Normal breath sounds.  Musculoskeletal: Normal range of motion.  Lymphadenopathy:     Cervical: No cervical adenopathy.  Skin:    General: Skin is warm.  Neurological:     General: No focal deficit present.     Mental Status: She is alert and oriented to person, place, and time.  Psychiatric:        Attention and Perception: Attention normal.        Mood and Affect: Mood normal.        Speech: Speech normal.        Behavior: Behavior normal. Behavior is cooperative.        Thought Content: Thought content normal.        Cognition and Memory:  Cognition normal.        Judgment: Judgment normal.        Assessment and Plan        1. Redness of eye, left Reviewed case with Dr Moshe Cipro and she also assessed eye. Decision to send to ED for further eval emergently for risk of  Orbital cellulitis or some other eye emergency. Pt has low grade fever, no rashs for shingles seen. No allergies or other noted possible  conjunctivitis symptoms.  [facility] called, but there are no ophthalmologist there so  [place] was called and she was driven there by her husband.  2. Photophobia of left eye Reviewed case with Dr Moshe Cipro and she also assessed eye. Decision to send to ED for further eval emergently for risk of  Orbital cellulitis or some other eye emergency.  3. Pain, eye, left Reviewed case with Dr Moshe Cipro and she also assessed eye. Decision to send to ED for further eval emergently for risk of  Orbital cellulitis or some other eye emergency.  4. Blepharospasm of right eye Denies trouble with vision of Right eye   Follow Up: 1 week        Perlie Mayo, DNP, AGNP-BC [place], [address] [place], [place] [address] Office Hours: Mon-Thurs 8 am-5 pm; Fri 8 am-12 pm Office Phone:  [phone]  Office Fax: [phone]

## 2018-09-03 NOTE — ED Notes (Signed)
Ophthalmic strip and tetracaine at bedside.

## 2018-09-03 NOTE — ED Provider Notes (Signed)
[facility] EMERGENCY DEPARTMENT Provider Note   CSN: [id number] Arrival date & time: 09/03/18  1240    History   Chief Complaint Chief Complaint  Patient presents with  . eye redness    HPI Krista Strickland is a 68 y.o. female with history of IDDM, neuropathy, CVA, obesity, CKD sent to the ER by PCP for evaluation of left red eye.  There is documented concern for orbital cellulitis or some other eye emergency.  Patient reports red eyes since Monday associated with slight blurred vision, photophobia, watery tearing.  The pain is described as mild, located in the eyeball, intermittent.  No interventions.  No alleviating factors.  She denies any trauma.  No contact lens use.  No associated headache, nausea, vomiting.  No recent eye surgeries.  She last had an eye exam by an eye doctor in February 2020 and was told she needed glasses but never picked them up.     HPI  Past Medical History:  Diagnosis Date  . Arthritis   . Cancer ([place])    uterine  . Chronic kidney disease   . Coronary artery disease   . Diabetes mellitus, type 2 ([place])   . GERD (gastroesophageal reflux disease)    no medication in 2017  . Gout   . History of MRSA infection 03/2009   pt denies this  . Hypercalcemia 2017   managed by nephrology  . Hyperlipidemia   . Hypertension   . Obesity   . Psoriasis   . Stroke [facility]) 2013   "light"    Patient Active Problem List   Diagnosis Date Noted  . Type 2 diabetes mellitus with diabetic neuropathy, unspecified ([place]) 09/08/2017  . Special screening for malignant neoplasms, colon 07/06/2016  . Diabetic neuropathy ([place]) 11/09/2015  . Annual physical exam 12/16/2013  . Multinodular goiter (nontoxic) 09/20/2013  . Hypertriglyceridemia 08/05/2013  . Back pain 04/16/2013  . Type 2 diabetes mellitus with hyperglycemia ([place]) 12/07/2012  . 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  . CVA (cerebral vascular accident) ([place]) 07/23/2011  . CKD (chronic kidney  disease) stage 3, GFR 30-59 ml/min ([facility]) 11/07/2009  . SLEEP APNEA 09/06/2009  . SKIN TAG 07/14/2009  . Hyperuricemia 11/04/2008  . Hyperlipidemia LDL goal <100 07/02/2007  . Morbid obesity ([place]) 07/02/2007  . Essential hypertension 07/02/2007    Past Surgical History:  Procedure Laterality Date  . ABDOMINAL HYSTERECTOMY    . APPENDECTOMY  2012  . COLONOSCOPY WITH PROPOFOL N/A 08/24/2016   Procedure: COLONOSCOPY WITH PROPOFOL;  Surgeon: Rogene Houston, MD;  Location: [facility] ENDO [facility];  Service: Endoscopy;  Laterality: N/A;  10:30  . excison of rt breast cyst    . LAPAROSCOPIC SALPINGO OOPHERECTOMY Right 04/22/2012   Procedure: LAPAROSCOPIC SALPINGO OOPHORECTOMY;  Surgeon: Jonnie Kind, MD;  Location: [facility] ORS;  Service: Gynecology;  Laterality: Right;  end 11:17  . MASS EXCISION N/A 04/22/2012   Procedure: EXCISION SKIN TAGS NECK AND HEAD;  Surgeon: Jonnie Kind, MD;  Location: [facility] ORS;  Service: Gynecology;  Laterality: N/A;  start 11:19  . PARTIAL HYSTERECTOMY    . POLYPECTOMY  08/24/2016   Procedure: POLYPECTOMY;  Surgeon: Rogene Houston, MD;  Location: [facility] ENDO SUITE;  Service: Endoscopy;;  colon  . rt,. neck biopsy       OB History    Gravida  1   Para  1   Term      Preterm      AB  Living        SAB      TAB      Ectopic      Multiple      Live Births               Home Medications    Prior to Admission medications   Medication Sig Start Date End Date Taking? Authorizing Provider  allopurinol (ZYLOPRIM) 300 MG tablet Take 1 tablet by mouth once daily 09/02/18  Yes Sanjuana Kava, MD  aspirin 325 MG tablet Take 1 tablet (325 mg total) by mouth daily. 08/25/16  Yes Rehman, Mechele Dawley, MD  cyclobenzaprine (FLEXERIL) 10 MG tablet Take 10 mg by mouth 3 (three) times daily as needed for muscle spasms.    Yes [provider]  DILT-XR 180 MG 24 hr capsule Take 1 capsule by mouth once daily 09/02/18  Yes Fayrene Helper, MD  docusate sodium  (COLACE) 100 MG capsule Take 100 mg by mouth 2 (two) times daily. Constipation   Yes [provider]  DULoxetine (CYMBALTA) 30 MG capsule Take 30 mg by mouth daily.   Yes [provider]  ezetimibe (ZETIA) 10 MG tablet Take 1 tablet (10 mg total) by mouth daily. 09/13/17 09/14/18 Yes Fayrene Helper, MD  gabapentin (NEURONTIN) 400 MG capsule Take 400 mg by mouth 3 (three) times daily. 05/17/18  Yes [provider]  HYDROcodone-acetaminophen (NORCO/VICODIN) 5-325 MG tablet One tablet every six hours for pain.  Limit 7 days. 08/26/18  Yes Sanjuana Kava, MD  insulin NPH-regular Human (NOVOLIN 70/30 RELION) (70-30) 100 UNIT/ML injection Inject 220 Units into the skin daily with breakfast. 08/25/18  Yes Renato Shin, MD  insulin regular (NOVOLIN R RELION) 100 units/mL injection Inject 0.7 mLs (70 Units total) into the skin daily with supper. 08/25/18  Yes Renato Shin, MD  metoprolol tartrate (LOPRESSOR) 100 MG tablet Take 1 tablet by mouth twice daily 09/02/18  Yes Fayrene Helper, MD  potassium chloride (K-DUR) 10 MEQ tablet TAKE 3 TABLETS BY MOUTH THREE TIMES DAILY 06/30/18  Yes Fayrene Helper, MD  pravastatin (PRAVACHOL) 20 MG tablet TAKE 1 TABLET BY MOUTH ONCE DAILY BEFORE BREAKFAST 06/30/18  Yes Fayrene Helper, MD  spironolactone (ALDACTONE) 25 MG tablet TAKE 1 TABLET BY MOUTH ONCE DAILY 05/10/17  Yes Fayrene Helper, MD  torsemide (DEMADEX) 20 MG tablet Take 80 mg by mouth daily.  03/25/15  Yes [provider]  Jonetta Speak LANCETS 49Z MISC 1 Device by Does not apply route 2 (two) times daily. 04/06/14   Renato Shin, MD  [facility] ULTRA test strip USE 1 STRIP TO CHECK GLUCOSE TWICE DAILY 07/21/18   Renato Shin, MD  RELION INSULIN SYR 1CC/30G 30G X 5/16" 1 ML MISC USE ONE SYRINGE TO INJECT INSULIN SUBCUTANEOUSLY THREE TIMES DAILY AS DIRECTED 07/26/15   Renato Shin, MD  RELION INSULIN SYRINGE 31G X 15/64" 1 ML MISC USE 1 SYRINGE THREE TIMES DAILY AS  DIRECTED 07/29/17   Renato Shin, MD  tobramycin-dexamethasone [facility]) ophthalmic solution Place 2 drops into the left eye every 4 (four) hours while awake. 09/03/18   Kinnie Feil, PA-C    Family History Family History  Problem Relation Age of Onset  . Hypertension Brother   . Gout Brother   . Prostate cancer Brother   . Hypertension Brother   . Prostate cancer Brother   . Gout Brother   . Hypertension Sister   . Gout Sister   . Cancer  Sister 73       pancreatic   . Leukemia Sister 26  . Gout Sister   . Prostate cancer Brother   . Diabetes Neg Hx     Social History Social History   Tobacco Use  . Smoking status: Former Smoker    Packs/day: 0.25    Years: 12.00    Pack years: 3.00    Quit date: 07/16/1978    Years since quitting: 40.1  . Smokeless tobacco: Never Used  Substance Use Topics  . Alcohol use: No  . Drug use: No     Allergies   Benazepril, Metronidazole, Mobic [meloxicam], Penicillins, and Sulfonamide derivatives   Review of Systems Review of Systems  Eyes: Positive for photophobia, pain, discharge, redness and visual disturbance.  All other systems reviewed and are negative.    Physical Exam Updated Vital Signs BP (!) 162/82   Pulse (!) 56   Temp 99.5 F (37.5 C) (Oral)   Resp 20   SpO2 96%   Physical Exam Constitutional:      Appearance: She is well-developed.  HENT:     Head: Normocephalic.     Comments: No temporal tenderness     Nose: Nose normal.  Eyes:     General: Lids are normal.        Left eye: Discharge present.    Intraocular pressure: Right eye pressure is 17 mmHg. Left eye pressure is 20 mmHg.     Conjunctiva/sclera:     Left eye: Left conjunctiva is injected.     Comments:  RIGHT EYE: PERRL. No direct/consensual photosensitivity. EOMs intact, painless. Upper/lower lids without erythema, edema, tenderness, warmth, palpable mass, lesions.  Conjunctiva and sclera white without prominent vessels.  No limbic flush.   No chemosis. No proptosis. Slight cloudiness to edges of upper iris.   LEFT EYE: PERRL.  +Direct and consensual photosensitivity. EOMs intact, painless. Upper/lower lids without erythema, edema, tenderness, warmth, palpable mass, lesions.  Conjunctiva and sclera are injected. No chemosis. No proptosis.  +Limbic flush.  No fluorescein uptake.   Neck:     Musculoskeletal: Normal range of motion.  Cardiovascular:     Rate and Rhythm: Normal rate.  Pulmonary:     Effort: Pulmonary effort is normal. No respiratory distress.  Musculoskeletal: Normal range of motion.  Neurological:     Mental Status: She is alert.  Psychiatric:        Behavior: Behavior normal.      ED Treatments / Results  Labs (all labs ordered are listed, but only abnormal results are displayed) Labs Reviewed - No data to display  EKG None  Radiology No results found.  Procedures Procedures (including critical care time)  Medications Ordered in ED Medications  fluorescein ophthalmic strip 1 strip (has no administration in time range)  tetracaine (PONTOCAINE) 0.5 % ophthalmic solution 2 drop (has no administration in time range)     Initial Impression / Assessment and Plan / ED Course  I have reviewed the triage vital signs and the nursing notes.  Pertinent labs & imaging results that were available during my care of the patient were reviewed by me and considered in my medical decision making (see chart for details).  Highest concern for corneal abrasion vs uveitis/iritis.  She has eyelid edema, erythema, purulent drainage and infection like preseptal/orbital cellulitis, endophthalmitis, conjunctivitis.  No HA, temporal tenderness.  No open globe injury.   IOPs not c/w glaucoma.  No slit lamp functioning in ER and cannot visualize posterior  eye.  At baseline she has poor vision and now only mild blurred vision but no vision loss. Visual fields intact. CCRAO vs CRVO considered given h/o CVA however this  usually does not cause eye redness.   Will consult ophthalmology for assistance. Considering CT imaging.   1615: Case discussed with EDP.  EDP also evaluated patient.  I consulted Dr. Alanda Slim and discussed patient clinical features, exam.  He suspects this is likely iritis/inflammatory process.  History and exam is not consistent with infectious process such as orbital/preseptal cellulitis.  He does not think CT is warranted today.  Recommends TobraDex eyedrops given here, evaluation in clinic tomorrow.  Discussed plan with patient and husband who are in agreement.  Discussed return precautions. Final Clinical Impressions(s) / ED Diagnoses   Final diagnoses:  Redness of right eye    ED Discharge Orders         Ordered    tobramycin-dexamethasone [facility]) ophthalmic solution  Every 4 hours while awake     09/03/18 1610           Kinnie Feil, [place] 09/03/18 1624    Tegeler, Gwenyth Allegra, MD 09/04/18 2119

## 2018-09-03 NOTE — ED Notes (Signed)
..  Patient verbalizes understanding of discharge instructions. Opportunity for questioning and answers were provided. Armband removed by staff, pt discharged from ED.   Pt given prescription of tobradex to take home.

## 2018-09-03 NOTE — ED Triage Notes (Signed)
Patient sent from [facility] for further evaluation of left eye redness and discomfort since awakening on Monday-no known trauma. Reports decreased vision with same

## 2018-09-04 DIAGNOSIS — H40233 Intermittent angle-closure glaucoma, bilateral: Secondary | ICD-10-CM | POA: Diagnosis not present

## 2018-09-08 DIAGNOSIS — B351 Tinea unguium: Secondary | ICD-10-CM | POA: Diagnosis not present

## 2018-09-08 DIAGNOSIS — E1140 Type 2 diabetes mellitus with diabetic neuropathy, unspecified: Secondary | ICD-10-CM | POA: Diagnosis not present

## 2018-09-08 DIAGNOSIS — M79674 Pain in right toe(s): Secondary | ICD-10-CM | POA: Diagnosis not present

## 2018-09-08 DIAGNOSIS — M79675 Pain in left toe(s): Secondary | ICD-10-CM | POA: Diagnosis not present

## 2018-09-10 ENCOUNTER — Encounter: Payer: Self-pay | Admitting: Family Medicine

## 2018-09-10 ENCOUNTER — Ambulatory Visit (INDEPENDENT_AMBULATORY_CARE_PROVIDER_SITE_OTHER): Payer: PPO | Admitting: Family Medicine

## 2018-09-10 ENCOUNTER — Other Ambulatory Visit: Payer: Self-pay

## 2018-09-10 VITALS — BP 153/79 | HR 71 | Temp 99.1°F | Resp 12 | Ht 63.0 in | Wt 200.4 lb

## 2018-09-10 DIAGNOSIS — H5712 Ocular pain, left eye: Secondary | ICD-10-CM

## 2018-09-10 DIAGNOSIS — H53142 Visual discomfort, left eye: Secondary | ICD-10-CM

## 2018-09-10 NOTE — Patient Instructions (Signed)
    Thank you for coming into the office today. I appreciate the opportunity to provide you with the care for your health and wellness. Today we discussed: eye follow up  Next appt: 10/10/2018  No labs today.  Continue to use the eye drops.  Follow Up with the eye doctor as planned. Call insurance company if you are worried about coverage.  Please continue to practice social distancing to keep you, your family, and our community safe.  If you must go out, please wear a Mask and practice good handwashing.  [place] YOUR HANDS WELL AND FREQUENTLY. AVOID TOUCHING YOUR FACE, UNLESS YOUR HANDS ARE FRESHLY WASHED.  GET FRESH AIR DAILY. STAY HYDRATED WITH WATER.   It was a pleasure to see you and I look forward to continuing to work together on your health and well-being. Please do not hesitate to call the office if you need care or have questions about your care.  Have a wonderful day and week.  With Gratitude,  Cherly Beach, DNP, AGNP-BC

## 2018-09-10 NOTE — Progress Notes (Signed)
Subjective:     Patient ID: Krista Strickland, female   DOB: 14-Apr-1950, 68 y.o.   MRN: [id number]  Krista Strickland presents for redness of eye, left (1 week follow up) Krista Strickland is a 68 y.o. female with a past medical history significant for stroke, diabetes, obesity, and CKD. I saw Krista Strickland last week for new eye redness and pain, along with vision blurring. Ended up sending her to [facility] ED.  ED course: Presents for eye redness.  Patient reports that for the last few days she has had slightly blurry vision, photophobia, and watering eyes.  Patient describes mild pain.  No significant, vomiting, fevers, or chills.  No recent trauma.  On exam, left eye is injected and erythematous.  No surrounding erythema or tenderness.  Normal pupil exam.  Normal extraocular movement.  Fluorescein was utilized with no uptake on PA exam.  No corneal abrasion seen.     Ophthalmology was called who will see the patient in clinic.  Based on our exam, low suspicion for orbital cellulitis.  Doubt temporal arteritis.  No evidence of trauma.  No vision loss present.  Ophthalmology did not recommend CT and recommended outpatient follow-up.  They recommended TobraDex drops which were given.  Patient discharged with understanding of plan of care.  She reports following up with eye doctor the next day and was told she has some kind of infection perhaps uveitis, she does not recall the name. She also had pressure in the eye. She is using her eye gtts as directed. Redness is better but still feels pressure and itching at times after drops. She is following up this Friday with the eye doctor again.  Today patient denies signs and symptoms of COVID 19 infection including fever, chills, cough, shortness of breath, and headache.  Past Medical, Surgical, Social History, Allergies, and Medications have been Reviewed.   Past Medical History:  Diagnosis Date  . Arthritis   . Cancer ([place])    uterine   . Chronic kidney disease   . Coronary artery disease   . Diabetes mellitus, type 2 ([place])   . GERD (gastroesophageal reflux disease)    no medication in 2017  . Gout   . History of MRSA infection 03/2009   pt denies this  . Hypercalcemia 2017   managed by nephrology  . Hyperlipidemia   . Hypertension   . Obesity   . Psoriasis   . Stroke [facility]) 2013   "light"   Past Surgical History:  Procedure Laterality Date  . ABDOMINAL HYSTERECTOMY    . APPENDECTOMY  2012  . COLONOSCOPY WITH PROPOFOL N/A 08/24/2016   Procedure: COLONOSCOPY WITH PROPOFOL;  Surgeon: Rogene Houston, MD;  Location: [facility];  Service: Endoscopy;  Laterality: N/A;  10:30  . excison of rt breast cyst    . LAPAROSCOPIC SALPINGO OOPHERECTOMY Right 04/22/2012   Procedure: LAPAROSCOPIC SALPINGO OOPHORECTOMY;  Surgeon: Jonnie Kind, MD;  Location: [facility] ORS;  Service: Gynecology;  Laterality: Right;  end 11:17  . MASS EXCISION N/A 04/22/2012   Procedure: EXCISION SKIN TAGS NECK AND HEAD;  Surgeon: Jonnie Kind, MD;  Location: [facility] ORS;  Service: Gynecology;  Laterality: N/A;  start 11:19  . PARTIAL HYSTERECTOMY    . POLYPECTOMY  08/24/2016   Procedure: POLYPECTOMY;  Surgeon: Rogene Houston, MD;  Location: [facility];  Service: Endoscopy;;  colon  . rt,. neck biopsy     Social History   Socioeconomic  History  . Marital status: Married    Spouse name: Not on file  . Number of children: 1  . Years of education: Not on file  . Highest education level: 11th grade  Occupational History  . Occupation: disabled     Fish farm manager: UNEMPLOYED  Social Needs  . Financial resource strain: Not very hard  . Food insecurity    Worry: Never true    Inability: Never true  . Transportation needs    Medical: No    Non-medical: No  Tobacco Use  . Smoking status: Former Smoker    Packs/day: 0.25    Years: 12.00    Pack years: 3.00    Quit date: 07/16/1978    Years since quitting: 40.1  . Smokeless tobacco: Never Used   Substance and Sexual Activity  . Alcohol use: No  . Drug use: No  . Sexual activity: Yes    Birth control/protection: Surgical  Lifestyle  . Physical activity    Days per week: 0 days    Minutes per session: 0 min  . Stress: Not at all  Relationships  . Social Herbalist on phone: Twice a week    Gets together: Once a week    Attends religious service: More than 4 times per year    Active member of club or organization: No    Attends meetings of clubs or organizations: Never    Relationship status: Married  . Intimate partner violence    Fear of current or ex partner: No    Emotionally abused: No    Physically abused: No    Forced sexual activity: No  Other Topics Concern  . Not on file  Social History Narrative  . Not on file    Outpatient Encounter Medications as of 09/10/2018  Medication Sig  . allopurinol (ZYLOPRIM) 300 MG tablet Take 1 tablet by mouth once daily  . aspirin 325 MG tablet Take 1 tablet (325 mg total) by mouth daily.  . cyclobenzaprine (FLEXERIL) 10 MG tablet Take 10 mg by mouth 3 (three) times daily as needed for muscle spasms.   Marland Kitchen DILT-XR 180 MG 24 hr capsule Take 1 capsule by mouth once daily  . docusate sodium (COLACE) 100 MG capsule Take 100 mg by mouth 2 (two) times daily. Constipation  . DULoxetine (CYMBALTA) 30 MG capsule Take 30 mg by mouth daily.  Marland Kitchen ezetimibe (ZETIA) 10 MG tablet Take 1 tablet (10 mg total) by mouth daily.  Marland Kitchen gabapentin (NEURONTIN) 400 MG capsule Take 400 mg by mouth 3 (three) times daily.  Marland Kitchen HYDROcodone-acetaminophen (NORCO/VICODIN) 5-325 MG tablet One tablet every six hours for pain.  Limit 7 days.  . insulin NPH-regular Human (NOVOLIN 70/30 RELION) (70-30) 100 UNIT/ML injection Inject 220 Units into the skin daily with breakfast.  . insulin regular (NOVOLIN R RELION) 100 units/mL injection Inject 0.7 mLs (70 Units total) into the skin daily with supper.  . metoprolol tartrate (LOPRESSOR) 100 MG tablet Take 1 tablet  by mouth twice daily  . ONETOUCH DELICA LANCETS 87G MISC 1 Device by Does not apply route 2 (two) times daily.  Gl[facility] ULTRA test strip USE 1 STRIP TO CHECK GLUCOSE TWICE DAILY  . potassium chloride (K-DUR) 10 MEQ tablet TAKE 3 TABLETS BY MOUTH THREE TIMES DAILY  . pravastatin (PRAVACHOL) 20 MG tablet TAKE 1 TABLET BY MOUTH ONCE DAILY BEFORE BREAKFAST  . prednisoLONE acetate (PRED FORTE) 1 % ophthalmic suspension Place 1 drop into the left eye  4 (four) times daily.  Marland Kitchen RELION INSULIN SYR 1CC/30G 30G X 5/16" 1 ML MISC USE ONE SYRINGE TO INJECT INSULIN SUBCUTANEOUSLY THREE TIMES DAILY AS DIRECTED  . RELION INSULIN SYRINGE 31G X 15/64" 1 ML MISC USE 1 SYRINGE THREE TIMES DAILY AS DIRECTED  . spironolactone (ALDACTONE) 25 MG tablet TAKE 1 TABLET BY MOUTH ONCE DAILY  . tobramycin-dexamethasone (TOBRADEX) ophthalmic solution Place 2 drops into the left eye every 4 (four) hours.  . torsemide (DEMADEX) 20 MG tablet Take 80 mg by mouth daily.    No facility-administered encounter medications on file as of 09/10/2018.    Allergies  Allergen Reactions  . Benazepril Swelling  . Metronidazole Hives  . Mobic [Meloxicam] Hives  . Penicillins Hives and Swelling    Has patient had a PCN reaction causing immediate rash, facial/tongue/throat swelling, SOB or lightheadedness with hypotension: No Has patient had a PCN reaction causing severe rash involving mucus membranes or skin necrosis: Yes Has patient had a PCN reaction that required hospitalization: No Has patient had a PCN reaction occurring within the last 10 years: No If all of the above answers are "NO", then may proceed with Cephalosporin use.   . Sulfonamide Derivatives Hives    Review of Systems  HENT: Negative.   Eyes: Positive for pain and itching. Negative for redness and visual disturbance.  Respiratory: Negative.   Cardiovascular: Negative.   Gastrointestinal: Negative.   Endocrine: Negative.   Genitourinary: Negative.    Musculoskeletal: Negative.   Skin: Negative.   Allergic/Immunologic: Negative.   Neurological: Negative.   Hematological: Negative.   Psychiatric/Behavioral: Negative.   All other systems reviewed and are negative.      Objective:     BP (!) 153/79   Pulse 71   Temp 99.1 F (37.3 C) (Oral)   Resp 12   Ht 5\' 3"  (1.6 m)   Wt 200 lb 6.4 oz (90.9 kg)   SpO2 96%   BMI 35.50 kg/m   Physical Exam Vitals signs and nursing note reviewed.  Constitutional:      Appearance: Normal appearance. She is well-developed and well-groomed. She is obese.  HENT:     Head: Normocephalic and atraumatic.     Right Ear: External ear normal.     Left Ear: External ear normal.     Nose: Nose normal.     Mouth/Throat:     Mouth: Mucous membranes are moist.     Pharynx: Oropharynx is clear.  Eyes:     General: Lids are normal.        Right eye: No discharge.        Left eye: No discharge.     Extraocular Movements: Extraocular movements intact.     Conjunctiva/sclera: Conjunctivae normal.     Right eye: Right conjunctiva is not injected.     Left eye: Left conjunctiva is not injected.  Neck:     Musculoskeletal: Normal range of motion and neck supple.  Cardiovascular:     Rate and Rhythm: Normal rate and regular rhythm.     Pulses: Normal pulses.     Heart sounds: Normal heart sounds.  Pulmonary:     Effort: Pulmonary effort is normal.     Breath sounds: Normal breath sounds.  Musculoskeletal: Normal range of motion.  Skin:    General: Skin is warm.  Neurological:     General: No focal deficit present.     Mental Status: She is alert and oriented to person, place, and time.  Psychiatric:        Attention and Perception: Attention normal.        Mood and Affect: Mood normal.        Speech: Speech normal.        Behavior: Behavior normal. Behavior is cooperative.        Thought Content: Thought content normal.        Cognition and Memory: Cognition normal.        Judgment: Judgment  normal.        Assessment and Plan        1. Pain, eye, left Continue pain/pressure. Advised to follow up with eye dr at this time. Continue eye drops as directed.  2. Photophobia of left eye Mostly cleared, redness is gone. F/u with eye dr and continue medication eye drops.  Follow Up: 10/10/2018  Perlie Mayo, DNP, AGNP-BC [place] [place], [place] [address] Office Hours: Mon-Thurs 8 am-5 pm; Fri 8 am-12 pm Office Phone:  [phone]  Office Fax: [phone]

## 2018-09-16 ENCOUNTER — Other Ambulatory Visit: Payer: Self-pay | Admitting: Endocrinology

## 2018-09-16 DIAGNOSIS — H2000 Unspecified acute and subacute iridocyclitis: Secondary | ICD-10-CM | POA: Diagnosis not present

## 2018-09-21 ENCOUNTER — Other Ambulatory Visit: Payer: Self-pay | Admitting: Family Medicine

## 2018-09-22 DIAGNOSIS — H2000 Unspecified acute and subacute iridocyclitis: Secondary | ICD-10-CM | POA: Diagnosis not present

## 2018-09-29 DIAGNOSIS — H2000 Unspecified acute and subacute iridocyclitis: Secondary | ICD-10-CM | POA: Diagnosis not present

## 2018-10-10 ENCOUNTER — Other Ambulatory Visit: Payer: Self-pay

## 2018-10-10 ENCOUNTER — Ambulatory Visit (INDEPENDENT_AMBULATORY_CARE_PROVIDER_SITE_OTHER): Payer: PPO | Admitting: Family Medicine

## 2018-10-10 ENCOUNTER — Encounter: Payer: Self-pay | Admitting: Family Medicine

## 2018-10-10 DIAGNOSIS — I10 Essential (primary) hypertension: Secondary | ICD-10-CM

## 2018-10-10 DIAGNOSIS — E785 Hyperlipidemia, unspecified: Secondary | ICD-10-CM | POA: Diagnosis not present

## 2018-10-10 DIAGNOSIS — E1140 Type 2 diabetes mellitus with diabetic neuropathy, unspecified: Secondary | ICD-10-CM | POA: Diagnosis not present

## 2018-10-10 DIAGNOSIS — Z794 Long term (current) use of insulin: Secondary | ICD-10-CM

## 2018-10-10 NOTE — Progress Notes (Signed)
Virtual Visit via Telephone Note   This visit type was conducted due to national recommendations for restrictions regarding the COVID-19 Pandemic (e.g. social distancing) in an effort to limit this patient's exposure and mitigate transmission in our community.  Due to her co-morbid illnesses, this patient is at least at moderate risk for complications without adequate follow up.  This format is felt to be most appropriate for this patient at this time.  The patient did not have access to video technology/had technical difficulties with video requiring transitioning to audio format only (telephone).  All issues noted in this document were discussed and addressed.  No physical exam could be performed with this format.    Evaluation Performed:  Follow-up visit  Date:  10/10/2018   ID:  Krista Strickland, DOB [birth year], MRN [id number]  Patient Location: Home Provider Location: Office  Location of Patient: Home Location of Provider: Telehealth Consent was obtain for visit to be over via telehealth. I verified that I am speaking with the correct person using two identifiers.  PCP:  Fayrene Helper, MD   Chief Complaint:  Chronic conditions  History of Present Illness:    Krista Strickland is a 68 y.o. female with   Here for follow-up of hypertension. She reports that she is not exercising much due to back.  Reports trying to eat healthy. Avoiding salt and fats. Avoids fried foods, and fast foods. Blood pressure up and down sometimes well controlled at home.  SBP usually ranges 130-150.   DBP usually ranges 70-80  Cardiac symptoms: none. Patient denies: chest pain, chest pressure/discomfort, claudication, dyspnea, exertional chest pressure/discomfort, fatigue, irregular heart beat, lower extremity edema, near-syncope, orthopnea, palpitations, paroxysmal nocturnal dyspnea, syncope and tachypnea. Cardiovascular risk factors: advanced age (older than 73 for men, 60 for women), diabetes  mellitus, dyslipidemia, hypertension, obesity (BMI >= 30 kg/m2) and sedentary lifestyle. Use of agents associated with hypertension: none.  Reports taking all medications as directed and denies side effects.  Diabetes follow-up:  Blood sugars at home are running 72-88. Higher at night around 200-240's. Gets worried if under 250 at night cause she drops with medication at night.  Denies hypoglycemia.  Denies polydipsia and polyuria.  Last eye exam was  03/2018.  Last foot exam in office was March .  Last A1c 8.1% . Denies non-healing wounds or rashes. Denies signs of UTI or other infections. Patient follows a low sugar diet and checks feet regularly without concerns. Reports staying hydrated by drinking water.   The patient does not have symptoms concerning for COVID-19 infection (fever, chills, cough, or new shortness of breath).   Past Medical, Surgical, Social History, Allergies, and Medications have been Reviewed.   Past Medical History:  Diagnosis Date  . Arthritis   . Cancer ([place])    uterine  . Chronic kidney disease   . Coronary artery disease   . Diabetes mellitus, type 2 ([place])   . GERD (gastroesophageal reflux disease)    no medication in 2017  . Gout   . History of MRSA infection 03/2009   pt denies this  . Hypercalcemia 2017   managed by nephrology  . Hyperlipidemia   . Hypertension   . Obesity   . Psoriasis   . Stroke [facility]) 2013   "light"   Past Surgical History:  Procedure Laterality Date  . ABDOMINAL HYSTERECTOMY    . APPENDECTOMY  2012  . COLONOSCOPY WITH PROPOFOL N/A 08/24/2016   Procedure: COLONOSCOPY WITH PROPOFOL;  Surgeon: Rogene Houston, MD;  [facility];  Service: Endoscopy;  Laterality: N/A;  10:30  . excison of rt breast cyst    . LAPAROSCOPIC SALPINGO OOPHERECTOMY Right 04/22/2012   Procedure: LAPAROSCOPIC SALPINGO OOPHORECTOMY;  Surgeon: Jonnie Kind, MD;  Location: [facility] ORS;  Service: Gynecology;  Laterality: Right;  end 11:17  . MASS  EXCISION N/A 04/22/2012   Procedure: EXCISION SKIN TAGS NECK AND HEAD;  Surgeon: Jonnie Kind, MD;  Location: [facility] ORS;  Service: Gynecology;  Laterality: N/A;  start 11:19  . PARTIAL HYSTERECTOMY    . POLYPECTOMY  08/24/2016   Procedure: POLYPECTOMY;  Surgeon: Rogene Houston, MD;  Location: [facility];  Service: Endoscopy;;  colon  . rt,. neck biopsy       Current Meds  Medication Sig  . allopurinol (ZYLOPRIM) 300 MG tablet Take 1 tablet by mouth once daily  . aspirin 325 MG tablet Take 1 tablet (325 mg total) by mouth daily.  . cyclobenzaprine (FLEXERIL) 10 MG tablet Take 10 mg by mouth 3 (three) times daily as needed for muscle spasms.   Marland Kitchen DILT-XR 180 MG 24 hr capsule Take 1 capsule by mouth once daily  . docusate sodium (COLACE) 100 MG capsule Take 100 mg by mouth 2 (two) times daily. Constipation  . DULoxetine (CYMBALTA) 30 MG capsule Take 30 mg by mouth daily.  Marland Kitchen gabapentin (NEURONTIN) 400 MG capsule Take 400 mg by mouth 3 (three) times daily.  Marland Kitchen HYDROcodone-acetaminophen (NORCO/VICODIN) 5-325 MG tablet One tablet every six hours for pain.  Limit 7 days.  . insulin NPH-regular Human (NOVOLIN 70/30 RELION) (70-30) 100 UNIT/ML injection Inject 220 Units into the skin daily with breakfast.  . insulin regular (NOVOLIN R RELION) 100 units/mL injection Inject 0.7 mLs (70 Units total) into the skin daily with supper.  . metoprolol tartrate (LOPRESSOR) 100 MG tablet Take 1 tablet by mouth twice daily  . ONETOUCH DELICA LANCETS 81X MISC 1 Device by Does not apply route 2 (two) times daily.  Glory Rosebush ULTRA test strip USE 1 STRIP TO CHECK GLUCOSE TWICE DAILY  . potassium chloride (K-DUR) 10 MEQ tablet TAKE 3 TABLETS BY MOUTH THREE TIMES DAILY  . pravastatin (PRAVACHOL) 20 MG tablet TAKE 1 TABLET BY MOUTH ONCE DAILY BEFORE BREAKFAST  . prednisoLONE acetate (PRED FORTE) 1 % ophthalmic suspension Place 1 drop into the left eye 4 (four) times daily.  Marland Kitchen RELION INSULIN SYR 1CC/30G 30G X 5/16" 1  ML MISC USE ONE SYRINGE TO INJECT INSULIN SUBCUTANEOUSLY THREE TIMES DAILY AS DIRECTED  . RELION INSULIN SYRINGE 31G X 15/64" 1 ML MISC USE 1 SYRINGE THREE TIMES DAILY AS DIRECTED  . spironolactone (ALDACTONE) 25 MG tablet TAKE 1 TABLET BY MOUTH ONCE DAILY  . tobramycin-dexamethasone (TOBRADEX) ophthalmic solution Place 2 drops into the left eye every 4 (four) hours.  . torsemide (DEMADEX) 20 MG tablet Take 80 mg by mouth daily.      Allergies:   Benazepril, Metronidazole, Mobic [meloxicam], Penicillins, and Sulfonamide derivatives   Social History   Tobacco Use  . Smoking status: Former Smoker    Packs/day: 0.25    Years: 12.00    Pack years: 3.00    Quit date: 07/16/1978    Years since quitting: 40.2  . Smokeless tobacco: Never Used  Substance Use Topics  . Alcohol use: No  . Drug use: No     Family Hx: The patient's family history includes Cancer (age of onset: 31) in her  sister; Gout in her brother, brother, sister, and sister; Hypertension in her brother, brother, and sister; Leukemia (age of onset: 38) in her sister; Prostate cancer in her brother, brother, and brother. There is no history of Diabetes.  ROS:   Please see the history of present illness.    All other systems reviewed and are negative.   Labs/Other Tests and Data Reviewed:    Recent Labs: 02/12/2018: ALT 69   Recent Lipid Panel Lab Results  Component Value Date/Time   CHOL 134 02/12/2018 02:54 PM   CHOL 117 09/07/2017 09:30 AM   TRIG 257 (H) 02/12/2018 02:54 PM   HDL 30 (L) 02/12/2018 02:54 PM   HDL 24 (L) 09/07/2017 09:30 AM   CHOLHDL 4.5 02/12/2018 02:54 PM   LDLCALC 70 02/12/2018 02:54 PM    Wt Readings from Last 3 Encounters:  10/10/18 200 lb (90.7 kg)  09/10/18 200 lb 6.4 oz (90.9 kg)  09/03/18 189 lb (85.7 kg)     Objective:    Vital Signs:  BP 139/79   Pulse 90   Ht 5\' 3"  (1.6 m)   Wt 200 lb (90.7 kg)   BMI 35.43 kg/m    VITAL SIGNS:  reviewed GEN:  alert and oriented  RESPIRATORY:  no shortness of breath in conversation PSYCH:  normal mood and affect  ASSESSMENT & PLAN:    1.  Morbid obesity ([place]) Krista Strickland is re-educated about the importance of exercise daily to help with weight management. A minumum of 30 minutes daily is recommended. Additionally, importance of healthy food choices  with portion control discussed. Has a hard time with walking secondary to back.  Is going to try to be a little bit better about getting some activity in her daily schedule. Fasting lipids for annual visit in January  Wt Readings from Last 3 Encounters:  10/10/18 200 lb (90.7 kg)  09/10/18 200 lb 6.4 oz (90.9 kg)  09/03/18 189 lb (85.7 kg)   - Lipid panel  2. Type 2 diabetes mellitus with diabetic neuropathy, with long-term current use of insulin ([place]) Krista Strickland is encouraged to check blood sugar daily as directed. Continue current medications. Is on statin as well. Educated on importance of maintain a well balanced diabetic friendly diet.  She is reminded the importance of maintaining  good blood sugars,  taking medications as directed, daily foot care, annual eye exams. Additionally educated about keeping good control over blood pressure and cholesterol as well. She is followed by endocrinology, Dr. Sable Feil ordered for annual visit in January  - COMPLETE METABOLIC PANEL WITH GFR - CBC - Microalbumin / creatinine urine ratio   3. Essential hypertension Krista Strickland is encouraged to maintain a well balanced diet that is low in salt. Controlled, continue current medication regimen. No refills needed.  Additionally, she is also reminded that exercise is beneficial for heart health and control of  Blood pressure. 30-60 minutes daily is recommended-walking was suggested. Her back gives trouble, so she has a hard time with that. Labs ordered to annual in Jan  - COMPLETE METABOLIC PANEL WITH GFR - CBC  4. Hyperlipidemia LDL goal  <100 Advised to continue a heart healthy/low-fat diet. On statin continue this.  - Lipid panel  Time:   Today, I have spent 10 minutes with the patient with telehealth technology discussing the above problems.     Medication Adjustments/Labs and Tests Ordered: Current medicines are reviewed at length with the patient today.  Concerns regarding medicines are outlined above.   Tests Ordered: Orders Placed This Encounter  Procedures  . COMPLETE METABOLIC PANEL WITH GFR  . CBC  . Lipid panel  . Microalbumin / creatinine urine ratio    Medication Changes: No orders of the defined types were placed in this encounter.   Disposition:  Follow up Jan 2021, flu clinic  Signed, Perlie Mayo, NP  10/10/2018 10:51 AM     [place]

## 2018-10-10 NOTE — Patient Instructions (Signed)
   I appreciate the opportunity to provide you with the care for your health and wellness.  Today we discussed: Blood pressure, diabetes, eye concerns, need for flu shot, upcoming appointment and labs  Follow-up: 2 appointments-1 appointment for flu shot clinic; 2 appointment for annual visit in January  Labs 1 week before annual visit fasting.  I hope that your eye continues to heal if you need anything for Dr. Moshe Cipro or I please let us know. Please continue all current medications as directed and prescribed.  Hopefully you can start walking a little bit more under your back is been bothering you.  But I do hope that she can get at least 30 minutes of exercise in daily. Continue to eat a heart healthy, low-sodium, low carbohydrate sugar diet.  HAPPY FALL AND WINTER :)   Please continue to practice social distancing to keep you, your family, and our community safe.  If you must go out, please wear a Mask and practice good handwashing.  [place] YOUR HANDS WELL AND FREQUENTLY. AVOID TOUCHING YOUR FACE, UNLESS YOUR HANDS ARE FRESHLY WASHED.  GET FRESH AIR DAILY. STAY HYDRATED WITH WATER.   It was a pleasure to see you and I look forward to continuing to work together on your health and well-being. Please do not hesitate to call the office if you need care or have questions about your care.  Have a wonderful day and week. With Gratitude, Cherly Beach, DNP, AGNP-BC

## 2018-10-13 ENCOUNTER — Ambulatory Visit (INDEPENDENT_AMBULATORY_CARE_PROVIDER_SITE_OTHER): Payer: PPO

## 2018-10-13 ENCOUNTER — Other Ambulatory Visit: Payer: Self-pay

## 2018-10-13 DIAGNOSIS — Z23 Encounter for immunization: Secondary | ICD-10-CM

## 2018-10-21 ENCOUNTER — Ambulatory Visit: Payer: PPO | Admitting: Orthopaedic Surgery

## 2018-10-21 ENCOUNTER — Encounter: Payer: Self-pay | Admitting: Orthopaedic Surgery

## 2018-10-21 ENCOUNTER — Other Ambulatory Visit: Payer: Self-pay

## 2018-10-21 VITALS — BP 150/95 | HR 64 | Temp 97.1°F | Ht 63.0 in | Wt 198.0 lb

## 2018-10-21 DIAGNOSIS — E0849 Diabetes mellitus due to underlying condition with other diabetic neurological complication: Secondary | ICD-10-CM | POA: Diagnosis not present

## 2018-10-21 DIAGNOSIS — M5442 Lumbago with sciatica, left side: Secondary | ICD-10-CM

## 2018-10-21 DIAGNOSIS — G8929 Other chronic pain: Secondary | ICD-10-CM | POA: Diagnosis not present

## 2018-10-21 MED ORDER — HYDROCODONE-ACETAMINOPHEN 5-325 MG PO TABS: ORAL_TABLET | ORAL | 0 refills | Status: DC

## 2018-10-21 NOTE — Progress Notes (Signed)
Patient Krista Strickland, female DOB:1950/08/27, 68 y.o. KDX:[id number]  Chief Complaint  Patient presents with  . Back Pain    lower, pain unchanged     HPI  Krista Strickland is a 68 y.o. female who has chronic lower back pain.  She has no new trauma.  She has had more pain recently with the cooler weather. She has no weakness. She is taking her medicine and doing her exercises.  Her A1C remains at 8.1   Body mass index is 35.07 kg/m.  ROS  Review of Systems  Constitutional: Positive for activity change.  Musculoskeletal: Positive for arthralgias, back pain, gait problem and joint swelling.  All other systems reviewed and are negative.   All other systems reviewed and are negative.  The following is a summary of the past history medically, past history surgically, known current medicines, social history and family history.  This information is gathered electronically by the computer from prior information and documentation.  I review this each visit and have found including this information at this point in the chart is beneficial and informative.    Past Medical History:  Diagnosis Date  . Arthritis   . Cancer ([place])    uterine  . Chronic kidney disease   . Coronary artery disease   . Diabetes mellitus, type 2 ([place])   . GERD (gastroesophageal reflux disease)    no medication in 2017  . Gout   . History of MRSA infection 03/2009   pt denies this  . Hypercalcemia 2017   managed by nephrology  . Hyperlipidemia   . Hypertension   . Obesity   . Psoriasis   . Stroke [facility]) 2013   "light"    Past Surgical History:  Procedure Laterality Date  . ABDOMINAL HYSTERECTOMY    . APPENDECTOMY  2012  . COLONOSCOPY WITH PROPOFOL N/A 08/24/2016   Procedure: COLONOSCOPY WITH PROPOFOL;  Surgeon: Rogene Houston, MD;  [facility];  Service: Endoscopy;  Laterality: N/A;  10:30  . excison of rt breast cyst    . LAPAROSCOPIC SALPINGO OOPHERECTOMY Right 04/22/2012   Procedure: LAPAROSCOPIC SALPINGO OOPHORECTOMY;  Surgeon: Jonnie Kind, MD;  Location: [facility] ORS;  Service: Gynecology;  Laterality: Right;  end 11:17  . MASS EXCISION N/A 04/22/2012   Procedure: EXCISION SKIN TAGS NECK AND HEAD;  Surgeon: Jonnie Kind, MD;  Location: [facility] OR[facility];  Service: Gynecology;  Laterality: N/A;  start 11:19  . PARTIAL HYSTERECTOMY    . POLYPECTOMY  08/24/2016   Procedure: POLYPECTOMY;  Surgeon: Rogene Houston, MD;  [facility];  Service: Endoscopy;;  colon  . rt,. neck biopsy      Family History  Problem Relation Age of Onset  . Hypertension Brother   . Gout Brother   . Prostate cancer Brother   . Hypertension Brother   . Prostate cancer Brother   . Gout Brother   . Hypertension Sister   . Gout Sister   . Cancer Sister 61       pancreatic   . Leukemia Sister 1  . Gout Sister   . Prostate cancer Brother   . Diabetes Neg Hx     Social History Social History   Tobacco Use  . Smoking status: Former Smoker    Packs/day: 0.25    Years: 12.00    Pack years: 3.00    Quit date: 07/16/1978    Years since quitting: 40.2  . Smokeless tobacco: Never Used  Substance  Use Topics  . Alcohol use: No  . Drug use: No    Allergies  Allergen Reactions  . Benazepril Swelling  . Metronidazole Hives  . Mobic [Meloxicam] Hives  . Penicillins Hives and Swelling    Has patient had a PCN reaction causing immediate rash, facial/tongue/throat swelling, SOB or lightheadedness with hypotension: No Has patient had a PCN reaction causing severe rash involving mucus membranes or skin necrosis: Yes Has patient had a PCN reaction that required hospitalization: No Has patient had a PCN reaction occurring within the last 10 years: No If all of the above answers are "NO", then may proceed with Cephalosporin use.   . Sulfonamide Derivatives Hives    Current Outpatient Medications  Medication Sig Dispense Refill  . allopurinol (ZYLOPRIM) 300 MG tablet Take 1 tablet  by mouth once daily 90 tablet 5  . aspirin 325 MG tablet Take 1 tablet (325 mg total) by mouth daily.    . cyclobenzaprine (FLEXERIL) 10 MG tablet Take 10 mg by mouth 3 (three) times daily as needed for muscle spasms.     Marland Kitchen DILT-XR 180 MG 24 hr capsule Take 1 capsule by mouth once daily 90 capsule 0  . docusate sodium (COLACE) 100 MG capsule Take 100 mg by mouth 2 (two) times daily. Constipation    . DULoxetine (CYMBALTA) 30 MG capsule Take 30 mg by mouth daily.    Marland Kitchen gabapentin (NEURONTIN) 400 MG capsule Take 400 mg by mouth 3 (three) times daily.    . insulin NPH-regular Human (NOVOLIN 70/30 RELION) (70-30) 100 UNIT/ML injection Inject 220 Units into the skin daily with breakfast. 70 mL 11  . insulin regular (NOVOLIN R RELION) 100 units/mL injection Inject 0.7 mLs (70 Units total) into the skin daily with supper. 30 mL 11  . metoprolol tartrate (LOPRESSOR) 100 MG tablet Take 1 tablet by mouth twice daily 180 tablet 0  . ONETOUCH DELICA LANCETS 28B MISC 1 Device by Does not apply route 2 (two) times daily. 60 each 11  . ONETOUCH ULTRA test strip USE 1 STRIP TO CHECK GLUCOSE TWICE DAILY 100 each 0  . potassium chloride (K-DUR) 10 MEQ tablet TAKE 3 TABLETS BY MOUTH THREE TIMES DAILY 270 tablet 0  . pravastatin (PRAVACHOL) 20 MG tablet TAKE 1 TABLET BY MOUTH ONCE DAILY BEFORE BREAKFAST 90 tablet 1  . prednisoLONE acetate (PRED FORTE) 1 % ophthalmic suspension Place 1 drop into the left eye 4 (four) times daily.    Marland Kitchen RELION INSULIN SYR 1CC/30G 30G X 5/16" 1 ML MISC USE ONE SYRINGE TO INJECT INSULIN SUBCUTANEOUSLY THREE TIMES DAILY AS DIRECTED 150 each 0  . RELION INSULIN SYRINGE 31G X 15/64" 1 ML MISC USE 1 SYRINGE THREE TIMES DAILY AS DIRECTED 150 each 0  . spironolactone (ALDACTONE) 25 MG tablet TAKE 1 TABLET BY MOUTH ONCE DAILY 90 tablet 1  . tobramycin-dexamethasone (TOBRADEX) ophthalmic solution Place 2 drops into the left eye every 4 (four) hours.    . torsemide (DEMADEX) 20 MG tablet Take 80 mg  by mouth daily.     Marland Kitchen ezetimibe (ZETIA) 10 MG tablet Take 1 tablet (10 mg total) by mouth daily. 90 tablet 3  . HYDROcodone-acetaminophen (NORCO/VICODIN) 5-325 MG tablet One tablet every six hours for pain.  Limit 7 days. 28 tablet 0   No current facility-administered medications for this visit.      Physical Exam  Blood pressure (!) 150/95, pulse 64, temperature (!) 97.1 F (36.2 C), height 5\' 3"  (1.6 m),  weight 198 lb (89.8 kg).  Constitutional: overall normal hygiene, normal nutrition, well developed, normal grooming, normal body habitus. Assistive device:none  Musculoskeletal: gait and station Limp none, muscle tone and strength are normal, no tremors or atrophy is present.  .  Neurological: coordination overall normal.  Deep tendon reflex/nerve stretch intact.  Sensation normal.  Cranial nerves II-XII intact.   Skin:   Normal overall no scars, lesions, ulcers or rashes. No psoriasis.  Psychiatric: Alert and oriented x 3.  Recent memory intact, remote memory unclear.  Normal mood and affect. Well groomed.  Good eye contact.  Cardiovascular: overall no swelling, no varicosities, no edema bilaterally, normal temperatures of the legs and arms, no clubbing, cyanosis and good capillary refill.  Spine/Pelvis examination:  Inspection:  Overall, sacoiliac joint benign and hips nontender; without crepitus or defects.   Thoracic spine inspection: Alignment normal without kyphosis present   Lumbar spine inspection:  Alignment  with normal lumbar lordosis, without scoliosis apparent.   Thoracic spine palpation:  without tenderness of spinal processes   Lumbar spine palpation: without tenderness of lumbar area; without tightness of lumbar muscles    Range of Motion:   Lumbar flexion, forward flexion is normal without pain or tenderness    Lumbar extension is full without pain or tenderness   Left lateral bend is normal without pain or tenderness   Right lateral bend is normal without  pain or tenderness   Straight leg raising is normal  Strength & tone: normal   Stability overall normal stability  Lymphatic: palpation is normal.  All other systems reviewed and are negative   The patient has been educated about the nature of the problem(s) and counseled on treatment options.  The patient appeared to understand what I have discussed and is in agreement with it.  Encounter Diagnoses  Name Primary?  . Chronic left-sided low back pain with left-sided sciatica Yes  . 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  Precautions discussed.  Continue current medications.   Return to clinic 2 months   I have reviewed the [place] web site prior to prescribing narcotic medicine for this patient.   Electronically Signed Sanjuana Kava, MD 9/22/20209:50 AM

## 2018-10-27 ENCOUNTER — Ambulatory Visit (INDEPENDENT_AMBULATORY_CARE_PROVIDER_SITE_OTHER): Payer: PPO | Admitting: Endocrinology

## 2018-10-27 ENCOUNTER — Encounter: Payer: Self-pay | Admitting: Endocrinology

## 2018-10-27 ENCOUNTER — Other Ambulatory Visit: Payer: Self-pay

## 2018-10-27 VITALS — BP 110/50 | HR 67 | Ht 63.0 in | Wt 199.6 lb

## 2018-10-27 DIAGNOSIS — E1140 Type 2 diabetes mellitus with diabetic neuropathy, unspecified: Secondary | ICD-10-CM

## 2018-10-27 DIAGNOSIS — Z794 Long term (current) use of insulin: Secondary | ICD-10-CM | POA: Diagnosis not present

## 2018-10-27 LAB — POCT GLYCOSYLATED HEMOGLOBIN (HGB A1C): Hemoglobin A1C: 8 % — AB (ref 4.0–5.6)

## 2018-10-27 MED ORDER — ONETOUCH ULTRA VI STRP: 1.0000 | ORAL_STRIP | Freq: Two times a day (BID) | 3 refills | Status: DC

## 2018-10-27 MED ORDER — NOVOLIN 70/30 RELION (70-30) 100 UNIT/ML ~~LOC~~ SUSP: 230.0000 [IU] | Freq: Every day | SUBCUTANEOUS | 11 refills | Status: DC

## 2018-10-27 NOTE — Progress Notes (Signed)
Subjective:    Patient ID: Krista Strickland, female    DOB: 1950/12/28, 68 y.o.   MRN: [id number]  HPI Pt returns for f/u of diabetes.   DM type: insulin-requiring type 2 Dx'ed: 3536 Complications: polyneuropathy, renal insufficiency and CVA.  Therapy: insulin since 2010 GDM: never DKA: never Severe hypoglycemia: never.   Pancreatitis: never.   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 she cannot afford analogs, so we had to try 70/30 (the NPH component caused no reaction this time).  She takes 70/30 QAM and reg QPM, due to the pattern of cbg's.   Interval history:  no cbg record, but states cbg's vary from 72-400.  It is in general higher as the day goes on.  pt states she feels well in general.  She says she never misses the insulin.   Pt also has small multinodular goiter (euthyroid; f/u US in 2017 showed no change, with no thyroid nodule meeting criteria for biopsy or surveillance; she is euthyroid off rx).  Past Medical History:  Diagnosis Date  . Arthritis   . Cancer ([place])    uterine  . Chronic kidney disease   . Coronary artery disease   . Diabetes mellitus, type 2 ([place])   . GERD (gastroesophageal reflux disease)    no medication in 2017  . Gout   . History of MRSA infection 03/2009   pt denies this  . Hypercalcemia 2017   managed by nephrology  . Hyperlipidemia   . Hypertension   . Obesity   . Psoriasis   . Stroke [facility]) 2013   "light"    Past Surgical History:  Procedure Laterality Date  . ABDOMINAL HYSTERECTOMY    . APPENDECTOMY  2012  . COLONOSCOPY WITH PROPOFOL N/A 08/24/2016   Procedure: COLONOSCOPY WITH PROPOFOL;  Surgeon: Rogene Houston, MD;  [facility];  Service: Endoscopy;  Laterality: N/A;  10:30  . excison of rt breast cyst    . LAPAROSCOPIC SALPINGO OOPHERECTOMY Right 04/22/2012   Procedure: LAPAROSCOPIC SALPINGO OOPHORECTOMY;  Surgeon: Jonnie Kind, MD;  Location: [facility] OR[facility];  Service: Gynecology;  Laterality: Right;  end 11:17  . MASS EXCISION N/A 04/22/2012   Procedure: EXCISION SKIN TAGS NECK AND HEAD;  Surgeon: Jonnie Kind, MD;  Location: [facility] OR[facility];  Service: Gynecology;  Laterality: N/A;  start 11:19  . PARTIAL HYSTERECTOMY    . POLYPECTOMY  08/24/2016   Procedure: POLYPECTOMY;  Surgeon: Rogene Houston, MD;  Location[facility];  Service: Endoscopy;;  colon  . rt,. neck biopsy      Social History   Socioeconomic History  . Marital status: Married    Spouse name: Not on file  . Number of children: 1  . Years of education: Not on file  . Highest education level: 11th grade  Occupational History  . Occupation: disabled     Fish farm manager: UNEMPLOYED  Social Needs  . Financial resource strain: Not very hard  . Food insecurity    Worry: Never true    Inability: Never true  . Transportation needs    Medical: No    Non-medical: No  Tobacco Use  . Smoking status: Former Smoker    Packs/day: 0.25    Years: 12.00    Pack years: 3.00    Quit date: 07/16/1978    Years since quitting: 40.3  . Smokeless tobacco: Never Used  Substance and Sexual  Activity  . Alcohol use: No  . Drug use: No  . Sexual activity: Yes    Birth control/protection: Surgical  Lifestyle  . Physical activity    Days per week: 0 days    Minutes per session: 0 min  . Stress: Not at all  Relationships  . Social Herbalist on phone: Twice a week    Gets together: Once a week    Attends religious service: More than 4 times per year    Active member of club or organization: No    Attends meetings of clubs or organizations: Never    Relationship status: Married  . Intimate partner violence    Fear of current or ex partner: No    Emotionally abused: No    Physically abused: No    Forced sexual activity: No  Other Topics Concern  . Not on file  Social History Narrative  . Not on file    Current Outpatient Medications on File  Prior to Visit  Medication Sig Dispense Refill  . allopurinol (ZYLOPRIM) 300 MG tablet Take 1 tablet by mouth once daily 90 tablet 5  . aspirin 325 MG tablet Take 1 tablet (325 mg total) by mouth daily.    . cyclobenzaprine (FLEXERIL) 10 MG tablet Take 10 mg by mouth 3 (three) times daily as needed for muscle spasms.     Marland Kitchen DILT-XR 180 MG 24 hr capsule Take 1 capsule by mouth once daily 90 capsule 0  . docusate sodium (COLACE) 100 MG capsule Take 100 mg by mouth 2 (two) times daily. Constipation    . DULoxetine (CYMBALTA) 30 MG capsule Take 30 mg by mouth daily.    Marland Kitchen gabapentin (NEURONTIN) 400 MG capsule Take 400 mg by mouth 3 (three) times daily.    Marland Kitchen HYDROcodone-acetaminophen (NORCO/VICODIN) 5-325 MG tablet One tablet every six hours for pain.  Limit 7 days. 28 tablet 0  . insulin regular (NOVOLIN R RELION) 100 units/mL injection Inject 0.7 mLs (70 Units total) into the skin daily with supper. 30 mL 11  . metoprolol tartrate (LOPRESSOR) 100 MG tablet Take 1 tablet by mouth twice daily 180 tablet 0  . ONETOUCH DELICA LANCETS 68E MISC 1 Device by Does not apply route 2 (two) times daily. 60 each 11  . potassium chloride (K-DUR) 10 MEQ tablet TAKE 3 TABLETS BY MOUTH THREE TIMES DAILY 270 tablet 0  . pravastatin (PRAVACHOL) 20 MG tablet TAKE 1 TABLET BY MOUTH ONCE DAILY BEFORE BREAKFAST 90 tablet 1  . prednisoLONE acetate (PRED FORTE) 1 % ophthalmic suspension Place 1 drop into the left eye 4 (four) times daily.    Marland Kitchen RELION INSULIN SYR 1CC/30G 30G X 5/16" 1 ML MISC USE ONE SYRINGE TO INJECT INSULIN SUBCUTANEOUSLY THREE TIMES DAILY AS DIRECTED 150 each 0  . RELION INSULIN SYRINGE 31G X 15/64" 1 ML MISC USE 1 SYRINGE THREE TIMES DAILY AS DIRECTED 150 each 0  . spironolactone (ALDACTONE) 25 MG tablet TAKE 1 TABLET BY MOUTH ONCE DAILY 90 tablet 1  . tobramycin-dexamethasone (TOBRADEX) ophthalmic solution Place 2 drops into the left eye every 4 (four) hours.    . torsemide (DEMADEX) 20 MG tablet Take 80  mg by mouth daily.     Marland Kitchen ezetimibe (ZETIA) 10 MG tablet Take 1 tablet (10 mg total) by mouth daily. 90 tablet 3   No current facility-administered medications on file prior to visit.     Allergies  Allergen Reactions  . Benazepril Swelling  .  Metronidazole Hives  . Mobic [Meloxicam] Hives  . Penicillins Hives and Swelling    Has patient had a PCN reaction causing immediate rash, facial/tongue/throat swelling, SOB or lightheadedness with hypotension: No Has patient had a PCN reaction causing severe rash involving mucus membranes or skin necrosis: Yes Has patient had a PCN reaction that required hospitalization: No Has patient had a PCN reaction occurring within the last 10 years: No If all of the above answers are "NO", then may proceed with Cephalosporin use.   . Sulfonamide Derivatives Hives    Family History  Problem Relation Age of Onset  . Hypertension Brother   . Gout Brother   . Prostate cancer Brother   . Hypertension Brother   . Prostate cancer Brother   . Gout Brother   . Hypertension Sister   . Gout Sister   . Cancer Sister 39       pancreatic   . Leukemia Sister 9  . Gout Sister   . Prostate cancer Brother   . Diabetes Neg Hx     BP (!) 110/50 (BP Location: Left Arm, Patient Position: Sitting, Cuff Size: Large)   Pulse 67   Ht 5\' 3"  (1.6 m)   Wt 199 lb 9.6 oz (90.5 kg)   SpO2 95%   BMI 35.36 kg/m    Review of Systems She denies hypoglycemia    Objective:   Physical Exam VITAL SIGNS:  See vs page GENERAL: no distress Pulses: dorsalis pedis intact bilat.   MSK: no deformity of the feet CV: 1+ bilat leg edema Skin:  no ulcer on the feet.  normal color and temp on the feet. Neuro: sensation is intact to touch on the feet Ext: both great toes are ingrown, but no erythema/swell/drainage.   Lab Results  Component Value Date   HGBA1C 8.0 (A) 10/27/2018      Assessment & Plan:  Insulin-requiring type 2 DM: she needs increased rx Renal insuff: in  this setting, he needs a fast-acting PM insulin.   Patient Instructions  check your blood sugar twice a day.  vary the time of day when you check, between before the 3 meals, and at bedtime.  also check if you have symptoms of your blood sugar being too high or too low.  please keep a record of the readings and bring it to your next appointment here.  You can write it on any piece of paper.  please call us sooner if your blood sugar goes below 70, or if you have a lot of readings over 200.   Please increase the 70/30 insulin to 230 units with breakfast.  However, take just 90 units on Sunday morning, and:  Please continue the same regular insulin: 70 units with supper.    On this type of insulin schedule, you should eat meals on a regular schedule.  If a meal is missed or significantly delayed, your blood sugar could go low.   Please come back for a follow-up appointment in 2 months.

## 2018-10-27 NOTE — Patient Instructions (Addendum)
check your blood sugar twice a day.  vary the time of day when you check, between before the 3 meals, and at bedtime.  also check if you have symptoms of your blood sugar being too high or too low.  please keep a record of the readings and bring it to your next appointment here.  You can write it on any piece of paper.  please call us sooner if your blood sugar goes below 70, or if you have a lot of readings over 200.   Please increase the 70/30 insulin to 230 units with breakfast.  However, take just 90 units on Sunday morning, and:  Please continue the same regular insulin: 70 units with supper.    On this type of insulin schedule, you should eat meals on a regular schedule.  If a meal is missed or significantly delayed, your blood sugar could go low.   Please come back for a follow-up appointment in 2 months.

## 2018-10-29 DIAGNOSIS — H20022 Recurrent acute iridocyclitis, left eye: Secondary | ICD-10-CM | POA: Diagnosis not present

## 2018-10-29 DIAGNOSIS — H524 Presbyopia: Secondary | ICD-10-CM | POA: Diagnosis not present

## 2018-10-29 DIAGNOSIS — H25813 Combined forms of age-related cataract, bilateral: Secondary | ICD-10-CM | POA: Diagnosis not present

## 2018-10-29 DIAGNOSIS — H5203 Hypermetropia, bilateral: Secondary | ICD-10-CM | POA: Diagnosis not present

## 2018-11-11 ENCOUNTER — Other Ambulatory Visit: Payer: Self-pay | Admitting: Family Medicine

## 2018-11-14 DIAGNOSIS — H209 Unspecified iridocyclitis: Secondary | ICD-10-CM | POA: Insufficient documentation

## 2018-11-14 DIAGNOSIS — H2513 Age-related nuclear cataract, bilateral: Secondary | ICD-10-CM | POA: Diagnosis not present

## 2018-11-14 DIAGNOSIS — H20022 Recurrent acute iridocyclitis, left eye: Secondary | ICD-10-CM | POA: Diagnosis not present

## 2018-11-14 DIAGNOSIS — H25813 Combined forms of age-related cataract, bilateral: Secondary | ICD-10-CM | POA: Diagnosis not present

## 2018-11-14 DIAGNOSIS — H35033 Hypertensive retinopathy, bilateral: Secondary | ICD-10-CM | POA: Insufficient documentation

## 2018-11-14 DIAGNOSIS — Z79899 Other long term (current) drug therapy: Secondary | ICD-10-CM | POA: Diagnosis not present

## 2018-11-14 DIAGNOSIS — Z794 Long term (current) use of insulin: Secondary | ICD-10-CM | POA: Diagnosis not present

## 2018-11-14 DIAGNOSIS — E119 Type 2 diabetes mellitus without complications: Secondary | ICD-10-CM | POA: Diagnosis not present

## 2018-11-17 DIAGNOSIS — M79675 Pain in left toe(s): Secondary | ICD-10-CM | POA: Diagnosis not present

## 2018-11-17 DIAGNOSIS — B351 Tinea unguium: Secondary | ICD-10-CM | POA: Diagnosis not present

## 2018-11-17 DIAGNOSIS — M79674 Pain in right toe(s): Secondary | ICD-10-CM | POA: Diagnosis not present

## 2018-11-17 DIAGNOSIS — E1140 Type 2 diabetes mellitus with diabetic neuropathy, unspecified: Secondary | ICD-10-CM | POA: Diagnosis not present

## 2018-11-24 ENCOUNTER — Other Ambulatory Visit: Payer: Self-pay | Admitting: Family Medicine

## 2018-11-24 DIAGNOSIS — H20022 Recurrent acute iridocyclitis, left eye: Secondary | ICD-10-CM | POA: Diagnosis not present

## 2018-12-01 ENCOUNTER — Telehealth: Payer: Self-pay | Admitting: Orthopaedic Surgery

## 2018-12-01 NOTE — Telephone Encounter (Signed)
Patient called for refill:  °HYDROcodone-acetaminophen (NORCO/VICODIN) 5-325 MG tablet 28 tablet  °-[facility], [place] ° °

## 2018-12-02 MED ORDER — HYDROCODONE-ACETAMINOPHEN 5-325 MG PO TABS: ORAL_TABLET | ORAL | 0 refills | Status: DC

## 2018-12-09 ENCOUNTER — Other Ambulatory Visit: Payer: Self-pay | Admitting: Family Medicine

## 2018-12-09 DIAGNOSIS — H20022 Recurrent acute iridocyclitis, left eye: Secondary | ICD-10-CM | POA: Diagnosis not present

## 2018-12-09 DIAGNOSIS — H25813 Combined forms of age-related cataract, bilateral: Secondary | ICD-10-CM | POA: Diagnosis not present

## 2018-12-09 DIAGNOSIS — E119 Type 2 diabetes mellitus without complications: Secondary | ICD-10-CM | POA: Diagnosis not present

## 2018-12-09 DIAGNOSIS — H35033 Hypertensive retinopathy, bilateral: Secondary | ICD-10-CM | POA: Diagnosis not present

## 2018-12-11 ENCOUNTER — Other Ambulatory Visit: Payer: Self-pay | Admitting: Family Medicine

## 2018-12-15 DIAGNOSIS — E559 Vitamin D deficiency, unspecified: Secondary | ICD-10-CM | POA: Diagnosis not present

## 2018-12-15 DIAGNOSIS — Z79899 Other long term (current) drug therapy: Secondary | ICD-10-CM | POA: Diagnosis not present

## 2018-12-15 DIAGNOSIS — D631 Anemia in chronic kidney disease: Secondary | ICD-10-CM | POA: Diagnosis not present

## 2018-12-18 ENCOUNTER — Ambulatory Visit: Payer: PPO

## 2018-12-18 ENCOUNTER — Encounter: Payer: Self-pay | Admitting: Orthopaedic Surgery

## 2018-12-18 ENCOUNTER — Ambulatory Visit: Payer: PPO | Admitting: Orthopaedic Surgery

## 2018-12-18 ENCOUNTER — Other Ambulatory Visit: Payer: Self-pay

## 2018-12-18 VITALS — BP 130/68 | HR 73 | Temp 96.8°F | Ht 63.0 in | Wt 199.0 lb

## 2018-12-18 DIAGNOSIS — M5442 Lumbago with sciatica, left side: Secondary | ICD-10-CM

## 2018-12-18 DIAGNOSIS — E876 Hypokalemia: Secondary | ICD-10-CM | POA: Insufficient documentation

## 2018-12-18 DIAGNOSIS — N189 Chronic kidney disease, unspecified: Secondary | ICD-10-CM | POA: Insufficient documentation

## 2018-12-18 DIAGNOSIS — D631 Anemia in chronic kidney disease: Secondary | ICD-10-CM | POA: Insufficient documentation

## 2018-12-18 DIAGNOSIS — E559 Vitamin D deficiency, unspecified: Secondary | ICD-10-CM | POA: Insufficient documentation

## 2018-12-18 DIAGNOSIS — N184 Chronic kidney disease, stage 4 (severe): Secondary | ICD-10-CM | POA: Insufficient documentation

## 2018-12-18 DIAGNOSIS — G8929 Other chronic pain: Secondary | ICD-10-CM

## 2018-12-18 DIAGNOSIS — N2581 Secondary hyperparathyroidism of renal origin: Secondary | ICD-10-CM | POA: Insufficient documentation

## 2018-12-18 DIAGNOSIS — R809 Proteinuria, unspecified: Secondary | ICD-10-CM | POA: Insufficient documentation

## 2018-12-18 MED ORDER — HYDROCODONE-ACETAMINOPHEN 5-325 MG PO TABS: ORAL_TABLET | ORAL | 0 refills | Status: DC

## 2018-12-18 NOTE — Progress Notes (Signed)
Patient WJ:XBJYN Krista Strickland, female DOB:04-09-1950, 68 y.o. WGN:[id number]  Chief Complaint  Patient presents with  . Back Pain    Low back pain    HPI  Krista Strickland is a 68 y.o. female who has more pain in the lower back with left sided sciatica.  She has pain running down to the left lateral foot. She has no new trauma.  Nothing seems to help.  She has no weakness, no redness.  She is tried of hurting.  The cold weather has made her much worse.   Body mass index is 35.25 kg/m.  ROS  Review of Systems  Constitutional: Positive for activity change.  Musculoskeletal: Positive for arthralgias, back pain, gait problem and joint swelling.  All other systems reviewed and are negative.   All other systems reviewed and are negative.  The following is a summary of the past history medically, past history surgically, known current medicines, social history and family history.  This information is gathered electronically by the computer from prior information and documentation.  I review this each visit and have found including this information at this point in the chart is beneficial and informative.    Past Medical History:  Diagnosis Date  . Arthritis   . Cancer ([place])    uterine  . Chronic kidney disease   . Coronary artery disease   . Diabetes mellitus, type 2 ([place])   . GERD (gastroesophageal reflux disease)    no medication in 2017  . Gout   . History of MRSA infection 03/2009   pt denies this  . Hypercalcemia 2017   managed by nephrology  . Hyperlipidemia   . Hypertension   . Obesity   . Psoriasis   . Stroke [facility]) 2013   "light"    Past Surgical History:  Procedure Laterality Date  . ABDOMINAL HYSTERECTOMY    . APPENDECTOMY  2012  . COLONOSCOPY WITH PROPOFOL N/A 08/24/2016   Procedure: COLONOSCOPY WITH PROPOFOL;  Surgeon: Rogene Houston, MD;  [facility];  Service: Endoscopy;  Laterality: N/A;  10:30  . excison of rt breast cyst    . LAPAROSCOPIC  SALPINGO OOPHERECTOMY Right 04/22/2012   Procedure: LAPAROSCOPIC SALPINGO OOPHORECTOMY;  Surgeon: Jonnie Kind, MD;  Location: [facility];  Service: Gynecology;  Laterality: Right;  end 11:17  . MASS EXCISION N/A 04/22/2012   Procedure: EXCISION SKIN TAGS NECK AND HEAD;  Surgeon: Jonnie Kind, MD;  Location: [facility];  Service: Gynecology;  Laterality: N/A;  start 11:19  . PARTIAL HYSTERECTOMY    . POLYPECTOMY  08/24/2016   Procedure: POLYPECTOMY;  Surgeon: Rogene Houston, MD;  [facility];  Service: Endoscopy;;  colon  . rt,. neck biopsy      Family History  Problem Relation Age of Onset  . Hypertension Brother   . Gout Brother   . Prostate cancer Brother   . Hypertension Brother   . Prostate cancer Brother   . Gout Brother   . Hypertension Sister   . Gout Sister   . Cancer Sister 82       pancreatic   . Leukemia Sister 2  . Gout Sister   . Prostate cancer Brother   . Diabetes Neg Hx     Social History Social History   Tobacco Use  . Smoking status: Former Smoker    Packs/day: 0.25    Years: 12.00    Pack years: 3.00    Quit date: 07/16/1978  Years since quitting: 40.4  . Smokeless tobacco: Never Used  Substance Use Topics  . Alcohol use: No  . Drug use: No    Allergies  Allergen Reactions  . Benazepril Swelling  . Metronidazole Hives  . Mobic [Meloxicam] Hives  . Penicillins Hives and Swelling    Has patient had a PCN reaction causing immediate rash, facial/tongue/throat swelling, SOB or lightheadedness with hypotension: No Has patient had a PCN reaction causing severe rash involving mucus membranes or skin necrosis: Yes Has patient had a PCN reaction that required hospitalization: No Has patient had a PCN reaction occurring within the last 10 years: No If all of the above answers are "NO", then may proceed with Cephalosporin use.   . Sulfonamide Derivatives Hives    Current Outpatient Medications  Medication Sig Dispense Refill  .  allopurinol (ZYLOPRIM) 300 MG tablet Take 1 tablet by mouth once daily 90 tablet 5  . aspirin 325 MG tablet Take 1 tablet (325 mg total) by mouth daily.    . cyclobenzaprine (FLEXERIL) 10 MG tablet Take 10 mg by mouth 3 (three) times daily as needed for muscle spasms.     Marland Kitchen DILT-XR 180 MG 24 hr capsule Take 1 capsule by mouth once daily 90 capsule 0  . docusate sodium (COLACE) 100 MG capsule Take 100 mg by mouth 2 (two) times daily. Constipation    . DULoxetine (CYMBALTA) 30 MG capsule Take 30 mg by mouth daily.    Marland Kitchen ezetimibe (ZETIA) 10 MG tablet Take 1 tablet by mouth once daily 90 tablet 0  . gabapentin (NEURONTIN) 400 MG capsule Take 400 mg by mouth 3 (three) times daily.    Marland Kitchen glucose blood (ONETOUCH ULTRA) test strip 1 each by Other route 2 (two) times daily. And lancets 2/day 200 each 3  . HYDROcodone-acetaminophen (NORCO/VICODIN) 5-325 MG tablet One tablet every six hours for pain.  Limit 7 days. 28 tablet 0  . insulin NPH-regular Human (NOVOLIN 70/30 RELION) (70-30) 100 UNIT/ML injection Inject 230 Units into the skin daily with breakfast. 70 mL 11  . insulin regular (NOVOLIN R RELION) 100 units/mL injection Inject 0.7 mLs (70 Units total) into the skin daily with supper. 30 mL 11  . metolazone (ZAROXOLYN) 5 MG tablet Take 1 tablet by mouth once daily 90 tablet 0  . metoprolol tartrate (LOPRESSOR) 100 MG tablet Take 1 tablet by mouth twice daily 180 tablet 0  . ONETOUCH DELICA LANCETS 32T MISC 1 Device by Does not apply route 2 (two) times daily. 60 each 11  . potassium chloride (KLOR-CON) 10 MEQ tablet TAKE 3 TABLETS BY MOUTH THREE TIMES DAILY 270 tablet 0  . pravastatin (PRAVACHOL) 20 MG tablet TAKE 1 TABLET BY MOUTH ONCE DAILY BEFORE BREAKFAST 90 tablet 1  . prednisoLONE acetate (PRED FORTE) 1 % ophthalmic suspension Place 1 drop into the left eye 4 (four) times daily.    Marland Kitchen RELION INSULIN SYR 1CC/30G 30G X 5/16" 1 ML MISC USE ONE SYRINGE TO INJECT INSULIN SUBCUTANEOUSLY THREE TIMES DAILY  AS DIRECTED 150 each 0  . RELION INSULIN SYRINGE 31G X 15/64" 1 ML MISC USE 1 SYRINGE THREE TIMES DAILY AS DIRECTED 150 each 0  . spironolactone (ALDACTONE) 25 MG tablet TAKE 1 TABLET BY MOUTH ONCE DAILY 90 tablet 1  . tobramycin-dexamethasone (TOBRADEX) ophthalmic solution Place 2 drops into the left eye every 4 (four) hours.    . torsemide (DEMADEX) 20 MG tablet Take 80 mg by mouth daily.  No current facility-administered medications for this visit.      Physical Exam  Blood pressure 130/68, pulse 73, temperature (!) 96.8 F (36 C), height 5\' 3"  (1.6 m), weight 199 lb (90.3 kg).  Constitutional: overall normal hygiene, normal nutrition, well developed, normal grooming, normal body habitus. Assistive device:none  Musculoskeletal: gait and station Limp none, muscle tone and strength are normal, no tremors or atrophy is present.  .  Neurological: coordination overall normal.  Deep tendon reflex/nerve stretch intact.  Sensation normal.  Cranial nerves II-XII intact.   Skin:   Normal overall no scars, lesions, ulcers or rashes. No psoriasis.  Psychiatric: Alert and oriented x 3.  Recent memory intact, remote memory unclear.  Normal mood and affect. Well groomed.  Good eye contact.  Cardiovascular: overall no swelling, no varicosities, no edema bilaterally, normal temperatures of the legs and arms, no clubbing, cyanosis and good capillary refill.  Lymphatic: palpation is normal.  Spine/Pelvis examination:  Inspection:  Overall, sacoiliac joint benign and hips tender; without crepitus or defects.   Thoracic spine inspection: Alignment normal without kyphosis present   Lumbar spine inspection:  Alignment  with normal lumbar lordosis, without scoliosis apparent.   Thoracic spine palpation:  without tenderness of spinal processes   Lumbar spine palpation: with tenderness of lumbar area; with tightness of lumbar muscles    Range of Motion:   Lumbar flexion, forward flexion is 30  with pain or tenderness    Lumbar extension is 5 with pain or tenderness   Left lateral bend is Normal  with pain or tenderness   Right lateral bend is Normal with pain or tenderness   Straight leg raising is Normal   Strength & tone: Normal   Stability overall normal stability   All other systems reviewed and are negative   X-rays were done of the lumbar spine, reported separately.  The patient has been educated about the nature of the problem(s) and counseled on treatment options.  The patient appeared to understand what I have discussed and is in agreement with it.  Encounter Diagnosis  Name Primary?  . Chronic left-sided low back pain with left-sided sciatica Yes    PLAN Call if any problems.  Precautions discussed.  Continue current medications.   Return to clinic 2 weeks   Get MRI of the lumbar spine.  Electronically Signed Sanjuana Kava, MD 11/19/20209:56 AM

## 2018-12-19 DIAGNOSIS — E870 Hyperosmolality and hypernatremia: Secondary | ICD-10-CM | POA: Diagnosis not present

## 2018-12-19 DIAGNOSIS — R600 Localized edema: Secondary | ICD-10-CM | POA: Diagnosis not present

## 2018-12-19 DIAGNOSIS — I129 Hypertensive chronic kidney disease with stage 1 through stage 4 chronic kidney disease, or unspecified chronic kidney disease: Secondary | ICD-10-CM | POA: Diagnosis not present

## 2018-12-19 DIAGNOSIS — E211 Secondary hyperparathyroidism, not elsewhere classified: Secondary | ICD-10-CM | POA: Diagnosis not present

## 2018-12-19 DIAGNOSIS — E1122 Type 2 diabetes mellitus with diabetic chronic kidney disease: Secondary | ICD-10-CM | POA: Diagnosis not present

## 2018-12-19 DIAGNOSIS — E1129 Type 2 diabetes mellitus with other diabetic kidney complication: Secondary | ICD-10-CM | POA: Diagnosis not present

## 2018-12-19 DIAGNOSIS — N189 Chronic kidney disease, unspecified: Secondary | ICD-10-CM | POA: Diagnosis not present

## 2018-12-19 DIAGNOSIS — D631 Anemia in chronic kidney disease: Secondary | ICD-10-CM | POA: Diagnosis not present

## 2018-12-19 DIAGNOSIS — R809 Proteinuria, unspecified: Secondary | ICD-10-CM | POA: Diagnosis not present

## 2018-12-23 ENCOUNTER — Other Ambulatory Visit (HOSPITAL_COMMUNITY): Payer: Self-pay | Admitting: Nephrology

## 2018-12-23 DIAGNOSIS — I10 Essential (primary) hypertension: Secondary | ICD-10-CM

## 2018-12-24 ENCOUNTER — Other Ambulatory Visit: Payer: Self-pay | Admitting: Family Medicine

## 2018-12-30 ENCOUNTER — Other Ambulatory Visit: Payer: Self-pay

## 2018-12-31 ENCOUNTER — Encounter: Payer: Self-pay | Admitting: Endocrinology

## 2018-12-31 ENCOUNTER — Ambulatory Visit (INDEPENDENT_AMBULATORY_CARE_PROVIDER_SITE_OTHER): Payer: PPO | Admitting: Endocrinology

## 2018-12-31 VITALS — BP 158/70 | HR 81 | Ht 63.0 in | Wt 203.6 lb

## 2018-12-31 DIAGNOSIS — E042 Nontoxic multinodular goiter: Secondary | ICD-10-CM | POA: Diagnosis not present

## 2018-12-31 DIAGNOSIS — Z794 Long term (current) use of insulin: Secondary | ICD-10-CM | POA: Diagnosis not present

## 2018-12-31 DIAGNOSIS — E1140 Type 2 diabetes mellitus with diabetic neuropathy, unspecified: Secondary | ICD-10-CM

## 2018-12-31 LAB — POCT GLYCOSYLATED HEMOGLOBIN (HGB A1C): Hemoglobin A1C: 7.1 % — AB (ref 4.0–5.6)

## 2018-12-31 MED ORDER — INSULIN REGULAR HUMAN 100 UNIT/ML IJ SOLN: 60.0000 [IU] | Freq: Every day | INTRAMUSCULAR | 11 refills | Status: DC

## 2018-12-31 NOTE — Progress Notes (Signed)
Subjective:    Patient ID: Krista Strickland, female    DOB: [birth year], 68 y.o.   MRN: [id number]  HPI Pt returns for f/u of diabetes.   DM type: insulin-requiring type 2 Dx'ed: 3536 Complications: polyneuropathy, renal insufficiency and CVA.  Therapy: insulin since 2010 GDM: never DKA: never Severe hypoglycemia: never.   Pancreatitis: never.   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 she cannot afford analogs, so we had to try 70/30 (the NPH component caused no reaction this time).  She takes 70/30 QAM and reg QPM, due to the pattern of cbg's.   Interval history:  no cbg record, but states cbg's vary from 60-240.  It is in general higher as the day goes on.  pt states she feels well in general.  She says she never misses the insulin.   Pt also has small multinodular goiter (euthyroid; f/u US in 2017 showed no change, with no thyroid nodule meeting criteria for biopsy or surveillance; she is euthyroid off rx).  She does not notice the goiter.   Past Medical History:  Diagnosis Date  . Arthritis   . Cancer ([place])    uterine  . Chronic kidney disease   . Coronary artery disease   . Diabetes mellitus, type 2 ([place])   . GERD (gastroesophageal reflux disease)    no medication in 2017  . Gout   . History of MRSA infection 03/2009   pt denies this  . Hypercalcemia 2017   managed by nephrology  . Hyperlipidemia   . Hypertension   . Obesity   . Psoriasis   . Stroke [facility]) 2013   "light"    Past Surgical History:  Procedure Laterality Date  . ABDOMINAL HYSTERECTOMY    . APPENDECTOMY  2012  . COLONOSCOPY WITH PROPOFOL N/A 08/24/2016   Procedure: COLONOSCOPY WITH PROPOFOL;  Surgeon: Rogene Houston, MD;  [facility];  Service: Endoscopy;  Laterality: N/A;  10:30  . excison of rt breast cyst    . LAPAROSCOPIC SALPINGO OOPHERECTOMY Right 04/22/2012   Procedure: LAPAROSCOPIC SALPINGO  OOPHORECTOMY;  Surgeon: Jonnie Kind, MD;  Location: [facility] ORS;  Service: Gynecology;  Laterality: Right;  end 11:17  . MASS EXCISION N/A 04/22/2012   Procedure: EXCISION SKIN TAGS NECK AND HEAD;  Surgeon: Jonnie Kind, MD;  Location: [facility] ORS;  Service: Gynecology;  Laterality: N/A;  start 11:19  . PARTIAL HYSTERECTOMY    . POLYPECTOMY  08/24/2016   Procedure: POLYPECTOMY;  Surgeon: Rogene Houston, MD;  Location[facility];  Service: Endoscopy;;  colon  . rt,. neck biopsy      Social History   Socioeconomic History  . Marital status: Married    Spouse name: Not on file  . Number of children: 1  . Years of education: Not on file  . Highest education level: 11th grade  Occupational History  . Occupation: disabled     Fish farm manager: UNEMPLOYED  Social Needs  . Financial resource strain: Not very hard  . Food insecurity    Worry: Never true    Inability: Never true  . Transportation needs    Medical: No    Non-medical: No  Tobacco Use  . Smoking status: Former Smoker    Packs/day: 0.25    Years: 12.00    Pack years: 3.00    Quit date: 07/16/1978    Years since quitting: 40.4  .  Smokeless tobacco: Never Used  Substance and Sexual Activity  . Alcohol use: No  . Drug use: No  . Sexual activity: Yes    Birth control/protection: Surgical  Lifestyle  . Physical activity    Days per week: 0 days    Minutes per session: 0 min  . Stress: Not at all  Relationships  . Social Herbalist on phone: Twice a week    Gets together: Once a week    Attends religious service: More than 4 times per year    Active member of club or organization: No    Attends meetings of clubs or organizations: Never    Relationship status: Married  . Intimate partner violence    Fear of current or ex partner: No    Emotionally abused: No    Physically abused: No    Forced sexual activity: No  Other Topics Concern  . Not on file  Social History Narrative  . Not on file    Current  Outpatient Medications on File Prior to Visit  Medication Sig Dispense Refill  . allopurinol (ZYLOPRIM) 300 MG tablet Take 1 tablet by mouth once daily 90 tablet 5  . aspirin 325 MG tablet Take 1 tablet (325 mg total) by mouth daily.    . cyclobenzaprine (FLEXERIL) 10 MG tablet Take 10 mg by mouth 3 (three) times daily as needed for muscle spasms.     Marland Kitchen DILT-XR 180 MG 24 hr capsule Take 1 capsule by mouth once daily 90 capsule 0  . docusate sodium (COLACE) 100 MG capsule Take 100 mg by mouth 2 (two) times daily. Constipation    . DULoxetine (CYMBALTA) 30 MG capsule Take 30 mg by mouth daily.    Marland Kitchen ezetimibe (ZETIA) 10 MG tablet Take 1 tablet by mouth once daily 90 tablet 0  . gabapentin (NEURONTIN) 400 MG capsule Take 400 mg by mouth 3 (three) times daily.    Marland Kitchen glucose blood (ONETOUCH ULTRA) test strip 1 each by Other route 2 (two) times daily. And lancets 2/day 200 each 3  . HYDROcodone-acetaminophen (NORCO/VICODIN) 5-325 MG tablet One tablet every six hours for pain.  Limit 7 days. 28 tablet 0  . insulin NPH-regular Human (NOVOLIN 70/30 RELION) (70-30) 100 UNIT/ML injection Inject 230 Units into the skin daily with breakfast. 70 mL 11  . metolazone (ZAROXOLYN) 5 MG tablet Take 1 tablet by mouth once daily 90 tablet 0  . metoprolol tartrate (LOPRESSOR) 100 MG tablet Take 1 tablet by mouth twice daily 180 tablet 0  . ONETOUCH DELICA LANCETS 37J MISC 1 Device by Does not apply route 2 (two) times daily. 60 each 11  . potassium chloride (KLOR-CON) 10 MEQ tablet TAKE 3 TABLETS BY MOUTH THREE TIMES DAILY 270 tablet 0  . pravastatin (PRAVACHOL) 20 MG tablet TAKE 1 TABLET BY MOUTH ONCE DAILY BEFORE BREAKFAST 90 tablet 0  . prednisoLONE acetate (PRED FORTE) 1 % ophthalmic suspension Place 1 drop into the left eye 4 (four) times daily.    Marland Kitchen RELION INSULIN SYR 1CC/30G 30G X 5/16" 1 ML MISC USE ONE SYRINGE TO INJECT INSULIN SUBCUTANEOUSLY THREE TIMES DAILY AS DIRECTED 150 each 0  . RELION INSULIN SYRINGE  31G X 15/64" 1 ML MISC USE 1 SYRINGE THREE TIMES DAILY AS DIRECTED 150 each 0  . spironolactone (ALDACTONE) 25 MG tablet TAKE 1 TABLET BY MOUTH ONCE DAILY 90 tablet 1  . tobramycin-dexamethasone (TOBRADEX) ophthalmic solution Place 2 drops into the left eye every  4 (four) hours.    . torsemide (DEMADEX) 20 MG tablet Take 80 mg by mouth daily.      No current facility-administered medications on file prior to visit.     Allergies  Allergen Reactions  . Benazepril Swelling  . Metronidazole Hives  . Mobic [Meloxicam] Hives  . Penicillins Hives and Swelling    Has patient had a PCN reaction causing immediate rash, facial/tongue/throat swelling, SOB or lightheadedness with hypotension: No Has patient had a PCN reaction causing severe rash involving mucus membranes or skin necrosis: Yes Has patient had a PCN reaction that required hospitalization: No Has patient had a PCN reaction occurring within the last 10 years: No If all of the above answers are "NO", then may proceed with Cephalosporin use.   . Sulfonamide Derivatives Hives    Family History  Problem Relation Age of Onset  . Hypertension Brother   . Gout Brother   . Prostate cancer Brother   . Hypertension Brother   . Prostate cancer Brother   . Gout Brother   . Hypertension Sister   . Gout Sister   . Cancer Sister 90       pancreatic   . Leukemia Sister 32  . Gout Sister   . Prostate cancer Brother   . Diabetes Neg Hx     BP (!) 158/70 (BP Location: Right Arm, Patient Position: Sitting, Cuff Size: Large)   Pulse 81   Ht 5\' 3"  (1.6 m)   Wt 203 lb 9.6 oz (92.4 kg)   SpO2 96%   BMI 36.07 kg/m    Review of Systems Denies LOC.      Objective:   Physical Exam VITAL SIGNS:  See vs page GENERAL: no distress NECK: There is no palpable thyroid enlargement.  No thyroid nodule is palpable.  No palpable lymphadenopathy at the anterior neck.   Pulses: dorsalis pedis intact bilat.   MSK: no deformity of the feet CV: 1+  bilat leg edema Skin:  no ulcer on the feet.  normal color and temp on the feet.   Neuro: sensation is intact to touch on the feet.     Lab Results  Component Value Date   CREATININE 2.39 (H) 09/07/2017   BUN 86 (HH) 09/07/2017   NA 139 09/07/2017   K 4.1 09/07/2017   CL 96 09/07/2017   CO2 22 09/07/2017    Lab Results  Component Value Date   HGBA1C 7.1 (A) 12/31/2018       Assessment & Plan:  HTN: is noted today Insulin-requiring type 2 DM, with CVA: overcontrolled, given this regimen, which does match insulin to her changing needs throughout the day. Renal failure: in this setting, she does not need the PM insulin to be 70/30   Patient Instructions  Your blood pressure is high today.  Please see your primary care provider soon, to have it rechecked.   check your blood sugar twice a day.  vary the time of day when you check, between before the 3 meals, and at bedtime.  also check if you have symptoms of your blood sugar being too high or too low.  please keep a record of the readings and bring it to your next appointment here.  You can write it on any piece of paper.  please call us sooner if your blood sugar goes below 70, or if you have a lot of readings over 200.   Please continue the same 70/30 insulin: 230 units with breakfast.  However, take just 90 units on Sunday morning, and:  Please reduce the regular insulin to 60 units with supper.    On this type of insulin schedule, you should eat meals on a regular schedule.  If a meal is missed or significantly delayed, your blood sugar could go low.   Blood tests are requested for you today.  We'll let you know about the results.  Please come back for a follow-up appointment in 2 months.

## 2018-12-31 NOTE — Patient Instructions (Addendum)
Your blood pressure is high today.  Please see your primary care provider soon, to have it rechecked.   check your blood sugar twice a day.  vary the time of day when you check, between before the 3 meals, and at bedtime.  also check if you have symptoms of your blood sugar being too high or too low.  please keep a record of the readings and bring it to your next appointment here.  You can write it on any piece of paper.  please call us sooner if your blood sugar goes below 70, or if you have a lot of readings over 200.   Please continue the same 70/30 insulin: 230 units with breakfast.  However, take just 90 units on Sunday morning, and:  Please reduce the regular insulin to 60 units with supper.    On this type of insulin schedule, you should eat meals on a regular schedule.  If a meal is missed or significantly delayed, your blood sugar could go low.   Blood tests are requested for you today.  We'll let you know about the results.  Please come back for a follow-up appointment in 2 months.

## 2019-01-01 ENCOUNTER — Ambulatory Visit: Payer: PPO | Admitting: Orthopaedic Surgery

## 2019-01-01 LAB — BASIC METABOLIC PANEL
BUN: 56 mg/dL — ABNORMAL HIGH (ref 6–23)
CO2: 27 mEq/L (ref 19–32)
Calcium: 10.1 mg/dL (ref 8.4–10.5)
Chloride: 99 mEq/L (ref 96–112)
Creatinine, Ser: 1.94 mg/dL — ABNORMAL HIGH (ref 0.40–1.20)
GFR: 30.98 mL/min — ABNORMAL LOW (ref >60.00)
Glucose, Bld: 231 mg/dL — ABNORMAL HIGH (ref 70–99)
Potassium: 4.7 mEq/L (ref 3.5–5.1)
Sodium: 135 mEq/L (ref 135–145)

## 2019-01-01 LAB — TSH: TSH: 1.61 u[IU]/mL (ref 0.35–4.50)

## 2019-01-02 ENCOUNTER — Ambulatory Visit (HOSPITAL_COMMUNITY): Payer: PPO

## 2019-01-02 ENCOUNTER — Telehealth: Payer: Self-pay

## 2019-01-02 NOTE — Telephone Encounter (Signed)
-----   Message from Renato Shin, MD sent at 01/01/2019  6:29 PM EST ----- please contact patient: Thyroid is normal.  Kidneys are improved--both good.   I'll see you next time.

## 2019-01-02 NOTE — Telephone Encounter (Signed)
Lab results reviewed by Dr. Ellison. Letter has been mailed. For future reference, letter can be found in Epic. 

## 2019-01-04 ENCOUNTER — Ambulatory Visit
Admission: RE | Admit: 2019-01-04 | Discharge: 2019-01-04 | Disposition: A | Discharge status: Home / Self Care | Payer: PPO | Source: Ambulatory Visit | Attending: Orthopaedic Surgery | Admitting: Orthopaedic Surgery

## 2019-01-04 ENCOUNTER — Other Ambulatory Visit: Payer: Self-pay

## 2019-01-04 DIAGNOSIS — M48061 Spinal stenosis, lumbar region without neurogenic claudication: Secondary | ICD-10-CM | POA: Diagnosis not present

## 2019-01-04 DIAGNOSIS — G8929 Other chronic pain: Secondary | ICD-10-CM

## 2019-01-05 ENCOUNTER — Ambulatory Visit (HOSPITAL_COMMUNITY): Payer: PPO | Attending: Nephrology

## 2019-01-08 ENCOUNTER — Other Ambulatory Visit: Payer: Self-pay

## 2019-01-08 DIAGNOSIS — G8929 Other chronic pain: Secondary | ICD-10-CM

## 2019-01-09 DIAGNOSIS — E1129 Type 2 diabetes mellitus with other diabetic kidney complication: Secondary | ICD-10-CM | POA: Diagnosis not present

## 2019-01-09 DIAGNOSIS — E870 Hyperosmolality and hypernatremia: Secondary | ICD-10-CM | POA: Diagnosis not present

## 2019-01-09 DIAGNOSIS — E1122 Type 2 diabetes mellitus with diabetic chronic kidney disease: Secondary | ICD-10-CM | POA: Diagnosis not present

## 2019-01-09 DIAGNOSIS — N189 Chronic kidney disease, unspecified: Secondary | ICD-10-CM | POA: Diagnosis not present

## 2019-01-09 DIAGNOSIS — E211 Secondary hyperparathyroidism, not elsewhere classified: Secondary | ICD-10-CM | POA: Diagnosis not present

## 2019-01-09 DIAGNOSIS — D631 Anemia in chronic kidney disease: Secondary | ICD-10-CM | POA: Diagnosis not present

## 2019-01-09 DIAGNOSIS — R809 Proteinuria, unspecified: Secondary | ICD-10-CM | POA: Diagnosis not present

## 2019-01-09 DIAGNOSIS — I129 Hypertensive chronic kidney disease with stage 1 through stage 4 chronic kidney disease, or unspecified chronic kidney disease: Secondary | ICD-10-CM | POA: Diagnosis not present

## 2019-01-15 ENCOUNTER — Other Ambulatory Visit (HOSPITAL_COMMUNITY): Payer: Self-pay | Admitting: Family Medicine

## 2019-01-15 DIAGNOSIS — Z1231 Encounter for screening mammogram for malignant neoplasm of breast: Secondary | ICD-10-CM

## 2019-01-16 ENCOUNTER — Ambulatory Visit (HOSPITAL_COMMUNITY)
Admission: RE | Admit: 2019-01-16 | Discharge: 2019-01-16 | Disposition: A | Discharge status: Home / Self Care | Payer: PPO | Source: Ambulatory Visit | Attending: Nephrology | Admitting: Nephrology

## 2019-01-16 ENCOUNTER — Telehealth: Payer: Self-pay | Admitting: *Deleted

## 2019-01-16 ENCOUNTER — Other Ambulatory Visit: Payer: Self-pay

## 2019-01-16 DIAGNOSIS — E211 Secondary hyperparathyroidism, not elsewhere classified: Secondary | ICD-10-CM | POA: Diagnosis not present

## 2019-01-16 DIAGNOSIS — I10 Essential (primary) hypertension: Secondary | ICD-10-CM | POA: Diagnosis not present

## 2019-01-16 DIAGNOSIS — N170 Acute kidney failure with tubular necrosis: Secondary | ICD-10-CM | POA: Diagnosis not present

## 2019-01-16 DIAGNOSIS — E1129 Type 2 diabetes mellitus with other diabetic kidney complication: Secondary | ICD-10-CM | POA: Diagnosis not present

## 2019-01-16 DIAGNOSIS — D631 Anemia in chronic kidney disease: Secondary | ICD-10-CM | POA: Diagnosis not present

## 2019-01-16 DIAGNOSIS — R600 Localized edema: Secondary | ICD-10-CM | POA: Diagnosis not present

## 2019-01-16 DIAGNOSIS — N189 Chronic kidney disease, unspecified: Secondary | ICD-10-CM | POA: Diagnosis not present

## 2019-01-16 DIAGNOSIS — E1122 Type 2 diabetes mellitus with diabetic chronic kidney disease: Secondary | ICD-10-CM | POA: Diagnosis not present

## 2019-01-16 DIAGNOSIS — E870 Hyperosmolality and hypernatremia: Secondary | ICD-10-CM | POA: Diagnosis not present

## 2019-01-16 DIAGNOSIS — R809 Proteinuria, unspecified: Secondary | ICD-10-CM | POA: Diagnosis not present

## 2019-01-16 NOTE — Progress Notes (Signed)
*  PRELIMINARY RESULTS* Echocardiogram 2D Echocardiogram has been performed.  Krista Strickland 01/16/2019, 11:40 AM

## 2019-01-19 ENCOUNTER — Ambulatory Visit: Payer: PPO | Admitting: Orthopaedic Surgery

## 2019-01-19 ENCOUNTER — Other Ambulatory Visit: Payer: Self-pay

## 2019-01-19 ENCOUNTER — Encounter: Payer: Self-pay | Admitting: Orthopaedic Surgery

## 2019-01-19 VITALS — BP 159/80 | HR 63 | Temp 97.5°F | Ht 63.0 in | Wt 195.0 lb

## 2019-01-19 DIAGNOSIS — E0849 Diabetes mellitus due to underlying condition with other diabetic neurological complication: Secondary | ICD-10-CM | POA: Diagnosis not present

## 2019-01-19 DIAGNOSIS — G8929 Other chronic pain: Secondary | ICD-10-CM

## 2019-01-19 DIAGNOSIS — M5442 Lumbago with sciatica, left side: Secondary | ICD-10-CM | POA: Diagnosis not present

## 2019-01-19 MED ORDER — HYDROCODONE-ACETAMINOPHEN 5-325 MG PO TABS: ORAL_TABLET | ORAL | 0 refills | Status: DC

## 2019-01-19 NOTE — Progress Notes (Signed)
Patient Krista Strickland, female DOB:1950-06-21, 68 y.o. WCH:[id number]  Chief Complaint  Patient presents with  . Back Pain    Here to go over MRI/my back still hurts    HPI  Krista Strickland is a 68 y.o. female who has chronic lower back pain.  She has had increased pain.  She had MRI which showed:  IMPRESSION: 1. 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 2. Small right subarticular disc protrusion at L5-S1 narrowing the right lateral recess, unchanged.  I have explained the findings to her.  I have arranged for her to have epidurals.  She is agreeable to this.  I will refill her pain medicine.  She is to continue to be active.  She has no weakness.  Body mass index is 34.54 kg/m.  ROS  Review of Systems  Constitutional: Positive for activity change.  Musculoskeletal: Positive for arthralgias, back pain, gait problem and joint swelling.  All other systems reviewed and are negative.   All other systems reviewed and are negative.  The following is a summary of the past history medically, past history surgically, known current medicines, social history and family history.  This information is gathered electronically by the computer from prior information and documentation.  I review this each visit and have found including this information at this point in the chart is beneficial and informative.    Past Medical History:  Diagnosis Date  . Arthritis   . Cancer ([place])    uterine  . Chronic kidney disease   . Coronary artery disease   . Diabetes mellitus, type 2 ([place])   . GERD (gastroesophageal reflux disease)    no medication in 2017  . Gout   . History of MRSA infection 03/2009   pt denies this  . Hypercalcemia 2017   managed by nephrology  . Hyperlipidemia   . Hypertension   . Obesity   . Psoriasis   . Stroke [facility]) 2013   "light"    Past Surgical History:  Procedure  Laterality Date  . ABDOMINAL HYSTERECTOMY    . APPENDECTOMY  2012  . COLONOSCOPY WITH PROPOFOL N/A 08/24/2016   Procedure: COLONOSCOPY WITH PROPOFOL;  Surgeon: Rogene Houston, MD;  [facility];  Service: Endoscopy;  Laterality: N/A;  10:30  . excison of rt breast cyst    . LAPAROSCOPIC SALPINGO OOPHERECTOMY Right 04/22/2012   Procedure: LAPAROSCOPIC SALPINGO OOPHORECTOMY;  Surgeon: Jonnie Kind, MD;  Location: [facility] OR[facility];  Service: Gynecology;  Laterality: Right;  end 11:17  . MASS EXCISION N/A 04/22/2012   Procedure: EXCISION SKIN TAGS NECK AND HEAD;  Surgeon: Jonnie Kind, MD;  Location: [facility] OR[facility];  Service: Gynecology;  Laterality: N/A;  start 11:19  . PARTIAL HYSTERECTOMY    . POLYPECTOMY  08/24/2016   Procedure: POLYPECTOMY;  Surgeon: Rogene Houston, MD;  Location: [facility];  Service: Endoscopy;;  colon  . rt,. neck biopsy      Family History  Problem Relation Age of Onset  . Hypertension Brother   . Gout Brother   . Prostate cancer Brother   . Hypertension Brother   . Prostate cancer Brother   . Gout Brother   . Hypertension Sister   . Gout Sister   . Cancer Sister 18       pancreatic   . Leukemia Sister 15  . Gout Sister   . Prostate cancer Brother   . Diabetes  Neg Hx     Social History Social History   Tobacco Use  . Smoking status: Former Smoker    Packs/day: 0.25    Years: 12.00    Pack years: 3.00    Quit date: 07/16/1978    Years since quitting: 40.5  . Smokeless tobacco: Never Used  Substance Use Topics  . Alcohol use: No  . Drug use: No    Allergies  Allergen Reactions  . Benazepril Swelling  . Metronidazole Hives  . Mobic [Meloxicam] Hives  . Penicillins Hives and Swelling    Has patient had a PCN reaction causing immediate rash, facial/tongue/throat swelling, SOB or lightheadedness with hypotension: No Has patient had a PCN reaction causing severe rash involving mucus membranes or skin necrosis: Yes Has patient had a PCN  reaction that required hospitalization: No Has patient had a PCN reaction occurring within the last 10 years: No If all of the above answers are "NO", then may proceed with Cephalosporin use.   . Sulfonamide Derivatives Hives    Current Outpatient Medications  Medication Sig Dispense Refill  . allopurinol (ZYLOPRIM) 300 MG tablet Take 1 tablet by mouth once daily 90 tablet 5  . aspirin 325 MG tablet Take 1 tablet (325 mg total) by mouth daily.    . cyclobenzaprine (FLEXERIL) 10 MG tablet Take 10 mg by mouth 3 (three) times daily as needed for muscle spasms.     Marland Kitchen DILT-XR 180 MG 24 hr capsule Take 1 capsule by mouth once daily 90 capsule 0  . docusate sodium (COLACE) 100 MG capsule Take 100 mg by mouth 2 (two) times daily. Constipation    . DULoxetine (CYMBALTA) 30 MG capsule Take 30 mg by mouth daily.    Marland Kitchen ezetimibe (ZETIA) 10 MG tablet Take 1 tablet by mouth once daily 90 tablet 0  . gabapentin (NEURONTIN) 400 MG capsule Take 400 mg by mouth 3 (three) times daily.    Marland Kitchen glucose blood (ONETOUCH ULTRA) test strip 1 each by Other route 2 (two) times daily. And lancets 2/day 200 each 3  . HYDROcodone-acetaminophen (NORCO/VICODIN) 5-325 MG tablet One tablet every six hours for pain.  Limit 7 days. 28 tablet 0  . insulin NPH-regular Human (NOVOLIN 70/30 RELION) (70-30) 100 UNIT/ML injection Inject 230 Units into the skin daily with breakfast. 70 mL 11  . insulin regular (NOVOLIN R RELION) 100 units/mL injection Inject 0.6 mLs (60 Units total) into the skin daily with supper. 30 mL 11  . metolazone (ZAROXOLYN) 5 MG tablet Take 1 tablet by mouth once daily 90 tablet 0  . metoprolol tartrate (LOPRESSOR) 100 MG tablet Take 1 tablet by mouth twice daily 180 tablet 0  . ONETOUCH DELICA LANCETS 24M MISC 1 Device by Does not apply route 2 (two) times daily. 60 each 11  . potassium chloride (KLOR-CON) 10 MEQ tablet TAKE 3 TABLETS BY MOUTH THREE TIMES DAILY 270 tablet 0  . pravastatin (PRAVACHOL) 20 MG  tablet TAKE 1 TABLET BY MOUTH ONCE DAILY BEFORE BREAKFAST 90 tablet 0  . prednisoLONE acetate (PRED FORTE) 1 % ophthalmic suspension Place 1 drop into the left eye 4 (four) times daily.    Marland Kitchen RELION INSULIN SYR 1CC/30G 30G X 5/16" 1 ML MISC USE ONE SYRINGE TO INJECT INSULIN SUBCUTANEOUSLY THREE TIMES DAILY AS DIRECTED 150 each 0  . RELION INSULIN SYRINGE 31G X 15/64" 1 ML MISC USE 1 SYRINGE THREE TIMES DAILY AS DIRECTED 150 each 0  . spironolactone (ALDACTONE) 25 MG tablet TAKE  1 TABLET BY MOUTH ONCE DAILY 90 tablet 1  . tobramycin-dexamethasone (TOBRADEX) ophthalmic solution Place 2 drops into the left eye every 4 (four) hours.    . torsemide (DEMADEX) 20 MG tablet Take 80 mg by mouth daily.      No current facility-administered medications for this visit.     Physical Exam  Blood pressure (!) 159/80, pulse 63, temperature (!) 97.5 F (36.4 C), height 5\' 3"  (1.6 m), weight 195 lb (88.5 kg).  Constitutional: overall normal hygiene, normal nutrition, well developed, normal grooming, normal body habitus. Assistive device:none  Musculoskeletal: gait and station Limp none, muscle tone and strength are normal, no tremors or atrophy is present.  .  Neurological: coordination overall normal.  Deep tendon reflex/nerve stretch intact.  Sensation normal.  Cranial nerves II-XII intact.   Skin:   Normal overall no scars, lesions, ulcers or rashes. No psoriasis.  Psychiatric: Alert and oriented x 3.  Recent memory intact, remote memory unclear.  Normal mood and affect. Well groomed.  Good eye contact.  Cardiovascular: overall no swelling, no varicosities, no edema bilaterally, normal temperatures of the legs and arms, no clubbing, cyanosis and good capillary refill.  Lymphatic: palpation is normal.  Spine/Pelvis examination:  Inspection:  Overall, sacoiliac joint benign and hips nontender; without crepitus or defects.   Thoracic spine inspection: Alignment normal without kyphosis  present   Lumbar spine inspection:  Alignment  with normal lumbar lordosis, without scoliosis apparent.   Thoracic spine palpation:  without tenderness of spinal processes   Lumbar spine palpation: without tenderness of lumbar area; without tightness of lumbar muscles    Range of Motion:   Lumbar flexion, forward flexion is normal without pain or tenderness    Lumbar extension is full without pain or tenderness   Left lateral bend is normal without pain or tenderness   Right lateral bend is normal without pain or tenderness   Straight leg raising is normal  Strength & tone: normal   Stability overall normal stability  All other systems reviewed and are negative   The patient has been educated about the nature of the problem(s) and counseled on treatment options.  The patient appeared to understand what I have discussed and is in agreement with it.  Encounter Diagnoses  Name Primary?  . Chronic left-sided low back pain with left-sided sciatica Yes  . 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  Precautions discussed.  Continue current medications.   Return to clinic 6 weeks   I have reviewed the [place] web site prior to prescribing narcotic medicine for this patient.   Electronically Signed Sanjuana Kava, MD 12/21/20201:41 PM

## 2019-01-26 ENCOUNTER — Other Ambulatory Visit: Payer: Self-pay | Admitting: Endocrinology

## 2019-01-27 ENCOUNTER — Other Ambulatory Visit: Payer: Self-pay

## 2019-01-27 DIAGNOSIS — R2689 Other abnormalities of gait and mobility: Secondary | ICD-10-CM | POA: Diagnosis not present

## 2019-01-27 DIAGNOSIS — I69898 Other sequelae of other cerebrovascular disease: Secondary | ICD-10-CM | POA: Diagnosis not present

## 2019-01-27 DIAGNOSIS — E1140 Type 2 diabetes mellitus with diabetic neuropathy, unspecified: Secondary | ICD-10-CM | POA: Diagnosis not present

## 2019-01-27 DIAGNOSIS — I951 Orthostatic hypotension: Secondary | ICD-10-CM | POA: Diagnosis not present

## 2019-01-27 MED ORDER — ONETOUCH ULTRA VI STRP: ORAL_STRIP | 2 refills | Status: DC

## 2019-01-28 DIAGNOSIS — N189 Chronic kidney disease, unspecified: Secondary | ICD-10-CM | POA: Diagnosis not present

## 2019-01-28 DIAGNOSIS — M79675 Pain in left toe(s): Secondary | ICD-10-CM | POA: Diagnosis not present

## 2019-01-28 DIAGNOSIS — E211 Secondary hyperparathyroidism, not elsewhere classified: Secondary | ICD-10-CM | POA: Diagnosis not present

## 2019-01-28 DIAGNOSIS — E1140 Type 2 diabetes mellitus with diabetic neuropathy, unspecified: Secondary | ICD-10-CM | POA: Diagnosis not present

## 2019-01-28 DIAGNOSIS — E870 Hyperosmolality and hypernatremia: Secondary | ICD-10-CM | POA: Diagnosis not present

## 2019-01-28 DIAGNOSIS — M79674 Pain in right toe(s): Secondary | ICD-10-CM | POA: Diagnosis not present

## 2019-01-28 DIAGNOSIS — B351 Tinea unguium: Secondary | ICD-10-CM | POA: Diagnosis not present

## 2019-01-28 DIAGNOSIS — E1129 Type 2 diabetes mellitus with other diabetic kidney complication: Secondary | ICD-10-CM | POA: Diagnosis not present

## 2019-01-28 DIAGNOSIS — E1122 Type 2 diabetes mellitus with diabetic chronic kidney disease: Secondary | ICD-10-CM | POA: Diagnosis not present

## 2019-01-28 DIAGNOSIS — N170 Acute kidney failure with tubular necrosis: Secondary | ICD-10-CM | POA: Diagnosis not present

## 2019-01-28 DIAGNOSIS — R809 Proteinuria, unspecified: Secondary | ICD-10-CM | POA: Diagnosis not present

## 2019-01-28 DIAGNOSIS — D631 Anemia in chronic kidney disease: Secondary | ICD-10-CM | POA: Diagnosis not present

## 2019-01-28 DIAGNOSIS — R600 Localized edema: Secondary | ICD-10-CM | POA: Diagnosis not present

## 2019-01-29 ENCOUNTER — Other Ambulatory Visit: Payer: Self-pay | Admitting: Physical Medicine and Rehabilitation

## 2019-01-29 DIAGNOSIS — F411 Generalized anxiety disorder: Secondary | ICD-10-CM

## 2019-01-29 MED ORDER — DIAZEPAM 5 MG PO TABS: ORAL_TABLET | ORAL | 0 refills | Status: DC

## 2019-01-29 NOTE — Telephone Encounter (Signed)
Called pt and lvm #1 and advised.

## 2019-01-29 NOTE — Telephone Encounter (Signed)
done

## 2019-01-29 NOTE — Progress Notes (Signed)
Pre-procedure diazepam ordered for pre-operative anxiety.  

## 2019-02-09 ENCOUNTER — Ambulatory Visit: Payer: Self-pay

## 2019-02-09 ENCOUNTER — Other Ambulatory Visit: Payer: Self-pay

## 2019-02-09 ENCOUNTER — Ambulatory Visit (INDEPENDENT_AMBULATORY_CARE_PROVIDER_SITE_OTHER): Payer: PPO | Admitting: Physical Medicine and Rehabilitation

## 2019-02-09 ENCOUNTER — Encounter: Payer: Self-pay | Admitting: Physical Medicine and Rehabilitation

## 2019-02-09 VITALS — BP 124/76 | HR 66

## 2019-02-09 DIAGNOSIS — M5416 Radiculopathy, lumbar region: Secondary | ICD-10-CM | POA: Diagnosis not present

## 2019-02-09 MED ORDER — METHYLPREDNISOLONE ACETATE 80 MG/ML IJ SUSP
40.0000 mg | Freq: Once | INTRAMUSCULAR | Status: AC
  Administered 2019-02-09: 40 mg

## 2019-02-09 NOTE — Progress Notes (Signed)
 .  Numeric Pain Rating Scale and Functional Assessment Average Pain 8   In the last MONTH (on 0-10 scale) has pain interfered with the following?  1. General activity like being  able to carry out your everyday physical activities such as walking, climbing stairs, carrying groceries, or moving a chair?  Rating(8)   +Driver, -BT, -Dye Allergies.  

## 2019-02-10 NOTE — Procedures (Signed)
Lumbar Epidural Steroid Injection - Interlaminar Approach with Fluoroscopic Guidance  Patient: Krista Strickland      Date of Birth: 14-Jun-1950 MRN: [id number] PCP: Fayrene Helper, MD      Visit Date: 02/09/2019   Universal Protocol:     Consent Given By: the patient  Position: PRONE  Additional Comments: Vital signs were monitored before and after the procedure. Patient was prepped and draped in the usual sterile fashion. The correct patient, procedure, and site was verified.   Injection Procedure Details:  Procedure Site One Meds Administered:  Meds ordered this encounter  Medications  . methylPREDNISolone acetate (DEPO-MEDROL) injection 40 mg     Laterality: Left  Location/Site:  L5-S1  Needle size: 20 G  Needle type: Tuohy  Needle Placement: Paramedian epidural  Findings:   -Comments: Excellent flow of contrast into the epidural space.  Procedure Details: Using a paramedian approach from the side mentioned above, the region overlying the inferior lamina was localized under fluoroscopic visualization and the soft tissues overlying this structure were infiltrated with 4 ml. of 1% Lidocaine without Epinephrine. The Tuohy needle was inserted into the epidural space using a paramedian approach.   The epidural space was localized using loss of resistance along with lateral and bi-planar fluoroscopic views.  After negative aspirate for air, blood, and CSF, a 2 ml. volume of Isovue-250 was injected into the epidural space and the flow of contrast was observed. Radiographs were obtained for documentation purposes.    The injectate was administered into the level noted above.   Additional Comments:  The patient tolerated the procedure well Dressing: 2 x 2 sterile gauze and Band-Aid    Post-procedure details: Patient was observed during the procedure. Post-procedure instructions were reviewed.  Patient left the clinic in stable condition.

## 2019-02-10 NOTE — Progress Notes (Signed)
Krista Strickland - 69 y.o. female MRN [id number]  Date of birth: Nov 30, 1950  Office Visit Note: Visit Date: 02/09/2019 PCP: Fayrene Helper, MD Referred by: Fayrene Helper, MD  Subjective: Chief Complaint  Patient presents with  . Lower Back - Pain  . Right Leg - Pain  . Left Leg - Pain   HPI:  IVRY Strickland is a 69 y.o. female who comes in today For planned L5-S1 interlaminar epidural steroid injection at the request of Dr. Sanjuana Kava.  Patient is having 8 out of 10 radicular pain from the back down to the buttocks and both legs.  Worse with standing and walking.  She is failed conservative care otherwise including medication management time and exercise.  Her case is complicated by type 2 diabetes and morbid obesity.  She has had a prior CVA.  MRI was obtained by Dr. 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  She has multilevel facet arthropathy.  Her back and leg pain is probably both related to the facet arthropathy and the stenosis.  We discussed starting with epidural injection and see how that does.  If she gets good relief of more of the leg pain but not the back pain would consider diagnostic medial branch blocks and radiofrequency ablation potentially.  If she gets good relief with everything but not exactly where she wants to be would look at repeating the injection x1.  We not really do a series of injections unless just needed.  This is determined by how the patient is doing.  ROS Otherwise per HPI.  Assessment & Plan: Visit Diagnoses:  1. Lumbar radiculopathy     Plan: No additional findings.   Meds & Orders:  Meds ordered this encounter  Medications  . methylPREDNISolone acetate (DEPO-MEDROL) injection 40 mg    Orders Placed This Encounter  Procedures  . XR C-ARM NO REPORT  . Epidural Steroid injection    Follow-up: Return if symptoms worsen or fail to improve.   Procedures: No  procedures performed  Lumbar Epidural Steroid Injection - Interlaminar Approach with Fluoroscopic Guidance  Patient: Krista Strickland      Date of Birth: [birth year] MRN: [id number] PCP: Fayrene Helper, MD      Visit Date: 02/09/2019   Universal Protocol:     Consent Given By: the patient  Position: PRONE  Additional Comments: Vital signs were monitored before and after the procedure. Patient was prepped and draped in the usual sterile fashion. The correct patient, procedure, and site was verified.   Injection Procedure Details:  Procedure Site One Meds Administered:  Meds ordered this encounter  Medications  . methylPREDNISolone acetate (DEPO-MEDROL) injection 40 mg     Laterality: Left  Location/Site:  L5-S1  Needle size: 20 G  Needle type: Tuohy  Needle Placement: Paramedian epidural  Findings:   -Comments: Excellent flow of contrast into the epidural space.  Procedure Details: Using a paramedian approach from the side mentioned above, the region overlying the inferior lamina was localized under fluoroscopic visualization and the soft tissues overlying this structure were infiltrated with 4 ml. of 1% Lidocaine without Epinephrine. The Tuohy needle was inserted into the epidural space using a paramedian approach.   The epidural space was localized using loss of resistance along with lateral and bi-planar fluoroscopic views.  After negative aspirate for air, blood, and CSF, a 2 ml. volume of Isovue-250 was injected into the epidural space and  the flow of contrast was observed. Radiographs were obtained for documentation purposes.    The injectate was administered into the level noted above.   Additional Comments:  The patient tolerated the procedure well Dressing: 2 x 2 sterile gauze and Band-Aid    Post-procedure details: Patient was observed during the procedure. Post-procedure instructions were reviewed.  Patient left the clinic in stable  condition.     Clinical History: MRI LUMBAR SPINE WITHOUT CONTRAST  TECHNIQUE: Multiplanar, multisequence MR imaging of the lumbar spine was performed. No intravenous contrast was administered.  COMPARISON:  Lumbar spine MRI 10/14/2014  FINDINGS: Segmentation:  Standard.  Alignment:  Grade 1 anterolisthesis at L4-5  Vertebrae: No acute compression fracture, discitis-osteomyelitis, facet edema or other focal marrow lesion. No epidural collection.  Conus medullaris and cauda equina: Conus extends to the L1-2 level. There is buckling of the cauda equina roots above the L4-5 level.  Paraspinal and other soft tissues: Negative  Disc levels:  T10-11 and T11-12 are imaged only in the sagittal plane. There is disc space narrowing without spinal canal stenosis.  T12-L1: Normal disc space and facets. No spinal canal or neuroforaminal stenosis.  L1-L2: Unchanged circumferential disc bulge. No spinal canal stenosis. Mild bilateral foraminal stenosis, unchanged.  L2-L3: Normal disc space and facets. No spinal canal or neuroforaminal stenosis.  L3-L4: Mild disc bulge. No spinal canal or neural foraminal stenosis.  L4-L5: Severe facet hypertrophy with mild widening of the facet joints. Intermediate circumferential disc bulge with severe bilateral neural foraminal stenosis. Moderate spinal canal stenosis. Progressed from the prior study.  L5-S1: Severe right and moderate left facet hypertrophy. Small right subarticular disc protrusion, unchanged. There is narrowing of the right lateral recess. No central spinal canal or neural foraminal stenosis.  Visualized sacrum: Normal.  IMPRESSION: 1. 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 2. Small right subarticular disc protrusion at L5-S1 narrowing the right lateral recess, unchanged.   Electronically Signed   By: Ulyses Jarred  M.D.   On: 01/05/2019 01:38     Objective:  VS:  HT:    WT:   BMI:     BP:124/76  HR:66bpm  TEMP: ( )  RESP:  Physical Exam  Ortho Exam Imaging: XR C-ARM NO REPORT  Result Date: 02/09/2019 Please see Notes tab for imaging impression.

## 2019-02-11 ENCOUNTER — Ambulatory Visit (INDEPENDENT_AMBULATORY_CARE_PROVIDER_SITE_OTHER): Payer: PPO | Admitting: Family Medicine

## 2019-02-11 ENCOUNTER — Encounter: Payer: Self-pay | Admitting: Family Medicine

## 2019-02-11 ENCOUNTER — Other Ambulatory Visit: Payer: Self-pay

## 2019-02-11 VITALS — BP 160/70 | HR 62 | Temp 98.7°F | Resp 15 | Ht 63.0 in | Wt 196.0 lb

## 2019-02-11 DIAGNOSIS — E669 Obesity, unspecified: Secondary | ICD-10-CM

## 2019-02-11 DIAGNOSIS — E785 Hyperlipidemia, unspecified: Secondary | ICD-10-CM | POA: Diagnosis not present

## 2019-02-11 DIAGNOSIS — Z0000 Encounter for general adult medical examination without abnormal findings: Secondary | ICD-10-CM

## 2019-02-11 DIAGNOSIS — R0789 Other chest pain: Secondary | ICD-10-CM

## 2019-02-11 DIAGNOSIS — E559 Vitamin D deficiency, unspecified: Secondary | ICD-10-CM

## 2019-02-11 DIAGNOSIS — E66811 Obesity, class 1: Secondary | ICD-10-CM

## 2019-02-11 DIAGNOSIS — E790 Hyperuricemia without signs of inflammatory arthritis and tophaceous disease: Secondary | ICD-10-CM

## 2019-02-11 DIAGNOSIS — I5189 Other ill-defined heart diseases: Secondary | ICD-10-CM

## 2019-02-11 DIAGNOSIS — R6889 Other general symptoms and signs: Secondary | ICD-10-CM

## 2019-02-11 DIAGNOSIS — I10 Essential (primary) hypertension: Secondary | ICD-10-CM | POA: Diagnosis not present

## 2019-02-11 LAB — HEPATIC FUNCTION PANEL
AG Ratio: 1.6 (calc) (ref 1.0–2.5)
ALT: 36 U/L — ABNORMAL HIGH (ref 6–29)
AST: 25 U/L (ref 10–35)
Albumin: 4.2 g/dL (ref 3.6–5.1)
Alkaline phosphatase (APISO): 70 U/L (ref 37–153)
Bilirubin, Direct: 0.1 mg/dL (ref 0.0–0.2)
Globulin: 2.6 g/dL (calc) (ref 1.9–3.7)
Indirect Bilirubin: 0.3 mg/dL (calc) (ref 0.2–1.2)
Total Bilirubin: 0.4 mg/dL (ref 0.2–1.2)
Total Protein: 6.8 g/dL (ref 6.1–8.1)

## 2019-02-11 LAB — VITAMIN D 25 HYDROXY (VIT D DEFICIENCY, FRACTURES): Vit D, 25-Hydroxy: 36 ng/mL (ref 30–100)

## 2019-02-11 LAB — LIPID PANEL
Cholesterol: 155 mg/dL (ref <200)
HDL: 32 mg/dL — ABNORMAL LOW (ref >=50)
LDL Cholesterol (Calc): 98 mg/dL (calc)
Non-HDL Cholesterol (Calc): 123 mg/dL (calc) (ref <130)
Total CHOL/HDL Ratio: 4.8 (calc) (ref <5.0)
Triglycerides: 150 mg/dL — ABNORMAL HIGH (ref <150)

## 2019-02-11 LAB — URIC ACID: Uric Acid, Serum: 6.9 mg/dL (ref 2.5–7.0)

## 2019-02-11 MED ORDER — HYDRALAZINE HCL 25 MG PO TABS: ORAL_TABLET | ORAL | 5 refills | Status: DC

## 2019-02-11 NOTE — Assessment & Plan Note (Signed)
  Patient re-educated about  the importance of commitment to a  minimum of 150 minutes of exercise per week as able.  The importance of healthy food choices with portion control discussed, as well as eating regularly and within a 12 hour window most days. The need to choose "clean , green" food 50 to 75% of the time is discussed, as well as to make water the primary drink and set a goal of 64 ounces water daily.    Weight /BMI 02/11/2019 01/19/2019 12/31/2018  WEIGHT 196 lb 195 lb 203 lb 9.6 oz  HEIGHT 5\' 3"  5\' 3"  5\' 3"   BMI 34.72 kg/m2 34.54 kg/m2 36.07 kg/m2

## 2019-02-11 NOTE — Patient Instructions (Signed)
F/u in office with MD in 2 months, call if you need me sooner  Labs today, lipid, hepatic. Vit D and uric acid  New additional medication for blood pressure is hydralazine, take one at 9 am and one at 9 pm, continue other medication t you are taking  You are referred to Dr Domenic Polite for evaluation of your heart  Congrats on excellent blood sugar  Thanks for choosing [facility], we consider it a privelige to serve you.  Best for 2021!

## 2019-02-11 NOTE — Assessment & Plan Note (Signed)
Increased fatigue with poor exercise tolerance, and chest discomfort, cardiology to evaluate in office

## 2019-02-11 NOTE — Progress Notes (Signed)
Krista Strickland     MRN: [id number]      DOB: Sep 17, 1950  HPI: Patient is in for annual physical exam. Blood pressure is uncontrolled and this is adressed C/o chest pressure and poor exercise tolerance x 2 months, had stress echo in December which did not suggest CAD, has had CVA, Cardiology eval appropriate , also as she is diabetic Chronic swelling of both feet Recent epidural of lumbar spine, seems to be effective on left but not on right side Immunization is reviewed , and  Is up to date.   PE: BP (!) 160/70   Pulse 62   Temp 98.7 F (37.1 C) (Temporal)   Resp 15   Ht 5\' 3"  (1.6 m)   Wt 196 lb (88.9 kg)   SpO2 98%   BMI 34.72 kg/m   Pleasant  female, alert and oriented x 3, in no cardio-pulmonary distress. Afebrile. HEENT No facial trauma or asymetry. Sinuses non tender.  Extra occullar muscles intact.. External ears normal, . Neck: supple, no adenopathy,JVD or thyromegaly.No bruits.  Chest: Clear to ascultation bilaterally.No crackles or wheezes. Non tender to palpation  Breast: No asymetry,no masses or lumps. No tenderness. No nipple discharge or inversion. No axillary or supraclavicular adenopathy  Cardiovascular system; Heart sounds normal,  S1 and  S2 ,no S3.  No murmur, or thrill. Apical beat not displaced Peripheral pulses normal.  Abdomen: Soft, non tender, no organomegaly or masses. No bruits. Bowel sounds normal. No guarding, tenderness or rebound.   GU: Asymptomatic , no exam indicated  Musculoskeletal exam:  Decreased ROM of spine, hips , shoulders and knees.  deformity and swelling noted in both ankles No muscle wasting or atrophy.   Neurologic: Cranial nerves 2 to 12 intact. Power, tone ,sensation  normal throughout.  disturbance in gait. No tremor.  Skin: Intact, no ulceration, erythema , scaling or rash noted. Pigmentation normal throughout  Psych; Normal mood and affect. Judgement and concentration normal   Assessment &  Plan:  Annual physical exam Annual exam as documented. Immunization and cancer screening needs are specifically addressed at this visit.   Essential hypertension Uncontrolled , add twice daily hydralazine DASH diet and commitment to daily physical activity for a minimum of 30 minutes discussed and encouraged, as a part of hypertension management. The importance of attaining a healthy weight is also discussed.  BP/Weight 02/11/2019 02/09/2019 01/19/2019 12/31/2018 12/18/2018 10/27/2018 09/11/4816  Systolic BP 563 149 702 637 858 850 277  Diastolic BP 70 76 80 70 68 50 95  Wt. (Lbs) 196 - 195 203.6 199 199.6 198  BMI 34.72 - 34.54 36.07 35.25 35.36 35.07     F/u in 2 months  Diastolic dysfunction Increased fatigue with poor exercise tolerance, and chest discomfort, cardiology to evaluate in office  Obesity (BMI 30.0-34.9)  Patient re-educated about  the importance of commitment to a  minimum of 150 minutes of exercise per week as able.  The importance of healthy food choices with portion control discussed, as well as eating regularly and within a 12 hour window most days. The need to choose "clean , green" food 50 to 75% of the time is discussed, as well as to make water the primary drink and set a goal of 64 ounces water daily.    Weight /BMI 02/11/2019 01/19/2019 12/31/2018  WEIGHT 196 lb 195 lb 203 lb 9.6 oz  HEIGHT 5\' 3"  5\' 3"  5\' 3"   BMI 34.72 kg/m2 34.54 kg/m2 36.07 kg/m2

## 2019-02-11 NOTE — Assessment & Plan Note (Addendum)
Annual exam as documented. . Immunization and cancer screening needs are specifically addressed at this visit.  

## 2019-02-11 NOTE — Assessment & Plan Note (Signed)
Uncontrolled , add twice daily hydralazine DASH diet and commitment to daily physical activity for a minimum of 30 minutes discussed and encouraged, as a part of hypertension management. The importance of attaining a healthy weight is also discussed.  BP/Weight 02/11/2019 02/09/2019 01/19/2019 12/31/2018 12/18/2018 10/27/2018 04/13/1759  Systolic BP 607 371 062 694 854 627 035  Diastolic BP 70 76 80 70 68 50 95  Wt. (Lbs) 196 - 195 203.6 199 199.6 198  BMI 34.72 - 34.54 36.07 35.25 35.36 35.07     F/u in 2 months

## 2019-02-12 ENCOUNTER — Encounter: Payer: Self-pay | Admitting: Cardiology

## 2019-02-12 ENCOUNTER — Ambulatory Visit (INDEPENDENT_AMBULATORY_CARE_PROVIDER_SITE_OTHER): Payer: PPO | Admitting: Cardiology

## 2019-02-12 VITALS — BP 138/58 | HR 58 | Ht 63.0 in | Wt 193.0 lb

## 2019-02-12 DIAGNOSIS — R0600 Dyspnea, unspecified: Secondary | ICD-10-CM | POA: Diagnosis not present

## 2019-02-12 DIAGNOSIS — I422 Other hypertrophic cardiomyopathy: Secondary | ICD-10-CM

## 2019-02-12 DIAGNOSIS — R0609 Other forms of dyspnea: Secondary | ICD-10-CM

## 2019-02-12 DIAGNOSIS — N1832 Chronic kidney disease, stage 3b: Secondary | ICD-10-CM

## 2019-02-12 DIAGNOSIS — M79605 Pain in left leg: Secondary | ICD-10-CM

## 2019-02-12 DIAGNOSIS — M79604 Pain in right leg: Secondary | ICD-10-CM | POA: Diagnosis not present

## 2019-02-12 DIAGNOSIS — I10 Essential (primary) hypertension: Secondary | ICD-10-CM | POA: Diagnosis not present

## 2019-02-12 NOTE — Patient Instructions (Addendum)
Medication Instructions:    Your physician recommends that you continue on your current medications as directed. Please refer to the Current Medication list given to you today.  Labwork:  NONE  Testing/Procedures:  Your physician has requested that you have a lower extremity arterial exercise duplex. During this test, exercise and ultrasound are used to evaluate arterial blood flow in the legs. Allow one hour for this exam. There are no restrictions or special instructions. Your physician has requested that you have an ankle brachial index (ABI). During this test an ultrasound and blood pressure cuff are used to evaluate the arteries that supply the arms and legs with blood. Allow thirty minutes for this exam. There are no restrictions or special instructions.  Follow-Up:  Your physician recommends that you schedule a follow-up appointment in: 6 months ([place] office) with [place]. You will receive a reminder letter in the mail in about 4 months reminding you to call and schedule your appointment. If you don't receive this letter, please contact our office.  Any Other Special Instructions Will Be Listed Below (If Applicable).  If you need a refill on your cardiac medications before your next appointment, please call your pharmacy.

## 2019-02-12 NOTE — Progress Notes (Signed)
Cardiology Office Note  Date: 02/12/2019   ID: KALEN NEIDERT, DOB 02-22-1950, MRN [id number]  PCP:  Fayrene Helper, MD  Consulting Cardiologist:  Rozann Lesches, MD Electrophysiologist:  None   Chief Complaint  Patient presents with  . Referred for abnormal echocardiogram    History of Present Illness: Krista Strickland is a 69 y.o. female referred for cardiology consultation by Dr. Moshe Cipro.  Chart reviewed, patient had visit with Dr. Moshe Cipro yesterday.  Note mentions concern for chest pain and exercise intolerance.  The patient is here today with her husband.  I asked her about symptomatology, she mainly complains of exertional bilateral hip and knee pain.  She states that she is able to function with typical ADLs around the house, does not have reproducible exertional chest pain and is describing NYHA class III dyspnea.  She does not describe any sudden onset palpitations or syncope.  Echocardiogram obtained in December 2020 reported hyperdynamic LVEF at 70 to 75% with severe LVH and mildly increased LVOT gradient, grade 1 diastolic dysfunction, moderate left atrial enlargement, trivial mitral regurgitation.  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  She does have a longstanding history of hypertension, also chronic kidney disease, stage III at this point.  She is following with nephrology.  She reports long-term leg swelling, does not tolerate compression hose very well.  She is currently on Demadex and Aldactone.  States that her legs feel cold at nighttime.  I personally reviewed her ECG which shows sinus bradycardia with nonspecific ST changes and poor anterior R wave progression, doubt anterior infarct pattern with normal wall motion by echocardiography, likely secondary to LVH.  Past Medical History:  Diagnosis Date  . Arthritis   . CKD (chronic kidney disease) stage 3, GFR 30-59 ml/min   . Diabetes mellitus, type 2 ([place])   . Essential  hypertension   . GERD (gastroesophageal reflux disease)   . Gout   . History of MRSA infection 03/2009  . History of stroke 2013  . Hypercalcemia 2017   Managed by nephrology  . Hyperlipidemia   . Obesity   . Psoriasis   . Uterine cancer [facility])     Past Surgical History:  Procedure Laterality Date  . ABDOMINAL HYSTERECTOMY    . APPENDECTOMY  2012  . COLONOSCOPY WITH PROPOFOL N/A 08/24/2016   Procedure: COLONOSCOPY WITH PROPOFOL;  Surgeon: Rogene Houston, MD;  Location: [facility];  Service: Endoscopy;  Laterality: N/A;  10:30  . Excison of right breast cyst    . LAPAROSCOPIC SALPINGO OOPHERECTOMY Right 04/22/2012   Procedure: LAPAROSCOPIC SALPINGO OOPHORECTOMY;  Surgeon: Jonnie Kind, MD;  Location: [facility] ORS;  Service: Gynecology;  Laterality: Right;  end 11:17  . MASS EXCISION N/A 04/22/2012   Procedure: EXCISION SKIN TAGS NECK AND HEAD;  Surgeon: Jonnie Kind, MD;  Location: [facility] ORS;  Service: Gynecology;  Laterality: N/A;  start 11:19  . PARTIAL HYSTERECTOMY    . POLYPECTOMY  08/24/2016   Procedure: POLYPECTOMY;  Surgeon: Rogene Houston, MD;  Location: [facility];  Service: Endoscopy;;  colon  . Right neck biopsy      Current Outpatient Medications  Medication Sig Dispense Refill  . allopurinol (ZYLOPRIM) 300 MG tablet Take 1 tablet by mouth once daily 90 tablet 5  . aspirin 325 MG tablet Take 1 tablet (325 mg total) by mouth daily.    . cyclobenzaprine (FLEXERIL) 10 MG tablet Take 10 mg by mouth 3 (three) times daily as  needed for muscle spasms.     Marland Kitchen DILT-XR 180 MG 24 hr capsule Take 1 capsule by mouth once daily 90 capsule 0  . docusate sodium (COLACE) 100 MG capsule Take 100 mg by mouth 2 (two) times daily. Constipation    . ezetimibe (ZETIA) 10 MG tablet Take 1 tablet by mouth once daily 90 tablet 0  . gabapentin (NEURONTIN) 400 MG capsule Take 400 mg by mouth 3 (three) times daily.    Marland Kitchen glucose blood (ONETOUCH ULTRA) test strip USE 1 STRIP TO CHECK GLUCOSE  TWICE DAILY. DX:E11.4 100 each 2  . hydrALAZINE (APRESOLINE) 25 MG tablet Take one tablet by mouth two times daily, at 9am and 9 pm 60 tablet 5  . insulin NPH-regular Human (NOVOLIN 70/30 RELION) (70-30) 100 UNIT/ML injection Inject 230 Units into the skin daily with breakfast. 70 mL 11  . insulin regular (NOVOLIN R RELION) 100 units/mL injection Inject 0.6 mLs (60 Units total) into the skin daily with supper. 30 mL 11  . metolazone (ZAROXOLYN) 5 MG tablet Take 1 tablet by mouth once daily 90 tablet 0  . metoprolol tartrate (LOPRESSOR) 100 MG tablet Take 1 tablet by mouth twice daily 180 tablet 0  . ONETOUCH DELICA LANCETS 91Y MISC 1 Device by Does not apply route 2 (two) times daily. 60 each 11  . potassium chloride (KLOR-CON) 10 MEQ tablet TAKE 3 TABLETS BY MOUTH THREE TIMES DAILY 270 tablet 0  . pravastatin (PRAVACHOL) 20 MG tablet TAKE 1 TABLET BY MOUTH ONCE DAILY BEFORE BREAKFAST 90 tablet 0  . prednisoLONE acetate (PRED FORTE) 1 % ophthalmic suspension Place 1 drop into the left eye 4 (four) times daily.    Marland Kitchen RELION INSULIN SYR 1CC/30G 30G X 5/16" 1 ML MISC USE ONE SYRINGE TO INJECT INSULIN SUBCUTANEOUSLY THREE TIMES DAILY AS DIRECTED 150 each 0  . RELION INSULIN SYRINGE 31G X 15/64" 1 ML MISC USE 1 SYRINGE THREE TIMES DAILY AS DIRECTED 150 each 0  . spironolactone (ALDACTONE) 25 MG tablet TAKE 1 TABLET BY MOUTH ONCE DAILY 90 tablet 1  . tobramycin-dexamethasone (TOBRADEX) ophthalmic solution Place 2 drops into the left eye every 4 (four) hours.    . torsemide (DEMADEX) 20 MG tablet Take 60 mg by mouth 2 (two) times daily.      No current facility-administered medications for this visit.   Allergies:  Benazepril, Metronidazole, Mobic [meloxicam], Penicillins, and Sulfonamide derivatives   Social History: The patient  reports that she quit smoking about 40 years ago. She has a 3.00 pack-year smoking history. She has never used smokeless tobacco. She reports that she does not drink alcohol or  use drugs.  Family History: The patient's family history includes Gout in her brother, brother, sister, and sister; Hypertension in her brother, brother, and sister; Leukemia (age of onset: 69) in her sister; Pancreatic cancer (age of onset: 24) in her sister; Prostate cancer in her brother, brother, and brother.   ROS:  Please see the history of present illness. Otherwise, complete review of systems is positive for chronic back pain, states that she had recent injections for pain control.  All other systems are reviewed and negative.   Physical Exam: VS:  BP (!) 138/58   Pulse (!) 58   Ht 5\' 3"  (1.6 m)   Wt 193 lb (87.5 kg)   BMI 34.19 kg/m , BMI Body mass index is 34.19 kg/m.  Wt Readings from Last 3 Encounters:  02/12/19 193 lb (87.5 kg)  02/11/19 196  lb (88.9 kg)  01/19/19 195 lb (88.5 kg)    General: Patient appears comfortable at rest. HEENT: Conjunctiva and lids normal, wearing a mask. Neck: Supple, no elevated JVP or carotid bruits, no thyromegaly. Lungs: Clear to auscultation, nonlabored breathing at rest. Cardiac: Regular rate and rhythm, no S3, 9-9/8 basal systolic murmur, no pericardial rub. Abdomen: Soft, nontender, bowel sounds present, . Extremities: Chronic appearing leg edema and lymphedema, distal pulses 1-2+. Skin: Warm and dry. Musculoskeletal: No kyphosis. Neuropsychiatric: Alert and oriented x3, affect grossly appropriate.  ECG:  An ECG dated 08/21/2016 was personally reviewed today and demonstrated:  Sinus rhythm with PVC, leftward axis, poor anterior R wave progression, rule out old anterior infarct pattern.  Recent Labwork: 12/31/2018: BUN 56; Creatinine, Ser 1.94; Potassium 4.7; Sodium 135; TSH 1.61 02/11/2019: ALT 36; AST 25     Component Value Date/Time   CHOL 155 02/11/2019 1026   CHOL 117 09/07/2017 0930   TRIG 150 (H) 02/11/2019 1026   HDL 32 (L) 02/11/2019 1026   HDL 24 (L) 09/07/2017 0930   CHOLHDL 4.8 02/11/2019 1026   VLDL 55 (H) 06/01/2016  1048   LDLCALC 98 02/11/2019 1026    Other Studies Reviewed Today:  Echocardiogram 01/16/2019:  1. Left ventricular ejection fraction, by visual estimation, is 70 to 75%. The left ventricle has hyperdynamic function. There is severely increased left ventricular hypertrophy (septal greater than posterior) consistent with hypertrophic  cardiomyopathy. Mildly increased LVOT gradient, increases with Valsalva also consistent with HOCM (gradient not measured).  2. Elevated left atrial pressure.  3. Left ventricular diastolic parameters are consistent with Grade I diastolic dysfunction (impaired relaxation).  4. The left ventricle has no regional wall motion abnormalities.  5. Global right ventricle has normal systolic function.The right ventricular size is normal. No increase in right ventricular wall thickness.  6. Left atrial size was moderately dilated.  7. Right atrial size was normal.  8. Presence of pericardial fat pad.  9. Moderate mitral annular calcification. 10. The mitral valve is degenerative. Trivial mitral valve regurgitation. 11. The tricuspid valve is grossly normal. Tricuspid valve regurgitation is trivial. 12. The aortic valve is tricuspid. Aortic valve regurgitation is not visualized. 13. The pulmonic valve was grossly normal. Pulmonic valve regurgitation is trivial. 14. TR signal is inadequate for assessing pulmonary artery systolic pressure. 15. The inferior vena cava is normal in size with greater than 50% respiratory variability, suggesting right atrial pressure of 3 mmHg.  Assessment and Plan:  1.  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  Diastolic dysfunction relatively mild.  RV contraction is normal.  She does have longstanding hypertension and also renal disease.  No palpitations or syncope.  Regimen includes diltiazem XR and Lopressor, heart rate is well controlled today.  This would  generally be a focus of therapy in terms of blood pressure control and reduction in exertional symptoms.  She in addition is on hydralazine which was recently added by Dr. Moshe Cipro.  We will continue observation for now.  2.  Chronic leg edema and lymphedema in the setting of renal insufficiency and hypertensive heart disease.  Currently on Demadex and Aldactone with follow-up by nephrology.  States that she does not tolerate compression stockings very well.  Depending on degree of symptoms, could be referred for possible mechanical compression.  3.  Essential hypertension, longstanding.  Current regimen reviewed.  No changes were made today.  4.  CKD stage IIIb, recent creatinine 1.94.  She  is following with nephrology.  5.  Bilateral hip, knee, and leg pain.  Patient states legs feel cold at nighttime as well.  She does have chronic back pain and some of this could be related to neuropathy.  She has not been assessed for PAD however.  We will obtain lower extremity Dopplers and ABIs.  Medication Adjustments/Labs and Tests Ordered: Current medicines are reviewed at length with the patient today.  Concerns regarding medicines are outlined above.   Tests Ordered: Orders Placed This Encounter  Procedures  . EKG 12-Lead  . LE ART SEG MULTI (Segm & LE Reynauds)    Medication Changes: No orders of the defined types were placed in this encounter.   Disposition:  Follow up 6 months in the [place] office with [place].  Signed, Satira Sark, MD, [facility] 02/12/2019 3:44 PM    [facility] [place], [place], [place] [address] Phone: [phone]; Fax: [phone]

## 2019-02-14 ENCOUNTER — Other Ambulatory Visit: Payer: Self-pay | Admitting: Family Medicine

## 2019-02-18 ENCOUNTER — Other Ambulatory Visit: Payer: Self-pay | Admitting: Cardiology

## 2019-02-18 DIAGNOSIS — I739 Peripheral vascular disease, unspecified: Secondary | ICD-10-CM

## 2019-02-18 DIAGNOSIS — R209 Unspecified disturbances of skin sensation: Secondary | ICD-10-CM

## 2019-02-23 ENCOUNTER — Other Ambulatory Visit: Payer: Self-pay

## 2019-02-23 ENCOUNTER — Ambulatory Visit (HOSPITAL_COMMUNITY)
Admission: RE | Admit: 2019-02-23 | Discharge: 2019-02-23 | Disposition: A | Discharge status: Home / Self Care | Payer: PPO | Source: Ambulatory Visit | Attending: Family Medicine | Admitting: Family Medicine

## 2019-02-23 DIAGNOSIS — Z1231 Encounter for screening mammogram for malignant neoplasm of breast: Secondary | ICD-10-CM | POA: Diagnosis not present

## 2019-02-25 ENCOUNTER — Other Ambulatory Visit: Payer: Self-pay

## 2019-02-25 ENCOUNTER — Ambulatory Visit (INDEPENDENT_AMBULATORY_CARE_PROVIDER_SITE_OTHER): Payer: PPO

## 2019-02-25 DIAGNOSIS — I739 Peripheral vascular disease, unspecified: Secondary | ICD-10-CM

## 2019-02-25 DIAGNOSIS — R209 Unspecified disturbances of skin sensation: Secondary | ICD-10-CM

## 2019-02-26 ENCOUNTER — Telehealth: Payer: Self-pay | Admitting: *Deleted

## 2019-02-26 ENCOUNTER — Other Ambulatory Visit (HOSPITAL_COMMUNITY): Payer: Self-pay | Admitting: Family Medicine

## 2019-02-26 DIAGNOSIS — R928 Other abnormal and inconclusive findings on diagnostic imaging of breast: Secondary | ICD-10-CM

## 2019-02-26 NOTE — Telephone Encounter (Signed)
Patient informed. Copy sent to PCP °

## 2019-02-26 NOTE — Telephone Encounter (Signed)
-----   Message from Satira Sark, MD sent at 02/25/2019  6:44 PM EST ----- Results reviewed.  Lower extremity ABIs are within normal range arguing against peripheral arterial disease as cause of her leg symptoms.  Keep same follow-up plan.

## 2019-02-27 DIAGNOSIS — H25813 Combined forms of age-related cataract, bilateral: Secondary | ICD-10-CM | POA: Diagnosis not present

## 2019-02-27 DIAGNOSIS — H35033 Hypertensive retinopathy, bilateral: Secondary | ICD-10-CM | POA: Diagnosis not present

## 2019-02-27 DIAGNOSIS — E119 Type 2 diabetes mellitus without complications: Secondary | ICD-10-CM | POA: Diagnosis not present

## 2019-02-27 DIAGNOSIS — H20022 Recurrent acute iridocyclitis, left eye: Secondary | ICD-10-CM | POA: Diagnosis not present

## 2019-03-01 ENCOUNTER — Other Ambulatory Visit: Payer: Self-pay | Admitting: Family Medicine

## 2019-03-03 ENCOUNTER — Other Ambulatory Visit: Payer: Self-pay

## 2019-03-03 ENCOUNTER — Ambulatory Visit: Payer: PPO | Admitting: Orthopaedic Surgery

## 2019-03-03 ENCOUNTER — Encounter: Payer: Self-pay | Admitting: Orthopaedic Surgery

## 2019-03-03 VITALS — BP 142/67 | HR 61 | Ht 63.0 in | Wt 194.0 lb

## 2019-03-03 DIAGNOSIS — G8929 Other chronic pain: Secondary | ICD-10-CM

## 2019-03-03 DIAGNOSIS — E0849 Diabetes mellitus due to underlying condition with other diabetic neurological complication: Secondary | ICD-10-CM | POA: Diagnosis not present

## 2019-03-03 DIAGNOSIS — M5442 Lumbago with sciatica, left side: Secondary | ICD-10-CM | POA: Diagnosis not present

## 2019-03-03 MED ORDER — HYDROCODONE-ACETAMINOPHEN 5-325 MG PO TABS: ORAL_TABLET | ORAL | 0 refills | Status: DC

## 2019-03-03 NOTE — Progress Notes (Signed)
Patient NO:BSJGG Krista Strickland, female DOB:06-20-1950, 69 y.o. EZM:[id number]  Chief Complaint  Patient presents with  . Back Pain    HPI  Krista Strickland is a 69 y.o. female who has lower back pain. She has good and bad days depending on the weather and her activity. She has no weakness. She has no trauma.  Her diabetes is stable.  She has diabetic peripheral neuropathy.     Body mass index is 34.37 kg/m.  ROS  Review of Systems  Constitutional: Positive for activity change.  Musculoskeletal: Positive for arthralgias, back pain, gait problem and joint swelling.  Neurological: Tremors:  .wkl.  All other systems reviewed and are negative.   All other systems reviewed and are negative.  The following is a summary of the past history medically, past history surgically, known current medicines, social history and family history.  This information is gathered electronically by the computer from prior information and documentation.  I review this each visit and have found including this information at this point in the chart is beneficial and informative.    Past Medical History:  Diagnosis Date  . Arthritis   . CKD (chronic kidney disease) stage 3, GFR 30-59 ml/min   . Diabetes mellitus, type 2 ([place])   . Essential hypertension   . GERD (gastroesophageal reflux disease)   . Gout   . History of MRSA infection 03/2009  . History of stroke 2013  . Hypercalcemia 2017   Managed by nephrology  . Hyperlipidemia   . Obesity   . Psoriasis   . Uterine cancer [facility])     Past Surgical History:  Procedure Laterality Date  . ABDOMINAL HYSTERECTOMY    . APPENDECTOMY  2012  . COLONOSCOPY WITH PROPOFOL N/A 08/24/2016   Procedure: COLONOSCOPY WITH PROPOFOL;  Surgeon: Rogene Houston, MD;  [facility];  Service: Endoscopy;  Laterality: N/A;  10:30  . Excison of right breast cyst    . LAPAROSCOPIC SALPINGO OOPHERECTOMY Right 04/22/2012   Procedure: LAPAROSCOPIC SALPINGO  OOPHORECTOMY;  Surgeon: Jonnie Kind, MD;  Location: [facility] ORS;  Service: Gynecology;  Laterality: Right;  end 11:17  . MASS EXCISION N/A 04/22/2012   Procedure: EXCISION SKIN TAGS NECK AND HEAD;  Surgeon: Jonnie Kind, MD;  Location: [facility] ORS;  Service: Gynecology;  Laterality: N/A;  start 11:19  . PARTIAL HYSTERECTOMY    . POLYPECTOMY  08/24/2016   Procedure: POLYPECTOMY;  Surgeon: Rogene Houston, MD;  [facility];  Service: Endoscopy;;  colon  . Right neck biopsy      Family History  Problem Relation Age of Onset  . Hypertension Brother   . Gout Brother   . Prostate cancer Brother   . Hypertension Brother   . Prostate cancer Brother   . Gout Brother   . Hypertension Sister   . Gout Sister   . Leukemia Sister 34  . Pancreatic cancer Sister 86  . Gout Sister   . Prostate cancer Brother   . Diabetes Neg Hx     Social History Social History   Tobacco Use  . Smoking status: Former Smoker    Packs/day: 0.25    Years: 12.00    Pack years: 3.00    Quit date: 07/16/1978    Years since quitting: 40.6  . Smokeless tobacco: Never Used  Substance Use Topics  . Alcohol use: No  . Drug use: No    Allergies  Allergen Reactions  . Benazepril Swelling  .  Metronidazole Hives  . Mobic [Meloxicam] Hives  . Penicillins Hives and Swelling    Has patient had a PCN reaction causing immediate rash, facial/tongue/throat swelling, SOB or lightheadedness with hypotension: No Has patient had a PCN reaction causing severe rash involving mucus membranes or skin necrosis: Yes Has patient had a PCN reaction that required hospitalization: No Has patient had a PCN reaction occurring within the last 10 years: No If all of the above answers are "NO", then may proceed with Cephalosporin use.   . Sulfonamide Derivatives Hives    Current Outpatient Medications  Medication Sig Dispense Refill  . allopurinol (ZYLOPRIM) 300 MG tablet Take 1 tablet by mouth once daily 90 tablet 5  .  aspirin 325 MG tablet Take 1 tablet (325 mg total) by mouth daily.    . cyclobenzaprine (FLEXERIL) 10 MG tablet Take 10 mg by mouth 3 (three) times daily as needed for muscle spasms.     Marland Kitchen DILT-XR 180 MG 24 hr capsule Take 1 capsule by mouth once daily 90 capsule 0  . docusate sodium (COLACE) 100 MG capsule Take 100 mg by mouth 2 (two) times daily. Constipation    . ezetimibe (ZETIA) 10 MG tablet Take 1 tablet by mouth once daily 90 tablet 0  . gabapentin (NEURONTIN) 400 MG capsule Take 400 mg by mouth 3 (three) times daily.    Marland Kitchen glucose blood (ONETOUCH ULTRA) test strip USE 1 STRIP TO CHECK GLUCOSE TWICE DAILY. DX:E11.4 100 each 2  . hydrALAZINE (APRESOLINE) 25 MG tablet Take one tablet by mouth two times daily, at 9am and 9 pm 60 tablet 5  . insulin NPH-regular Human (NOVOLIN 70/30 RELION) (70-30) 100 UNIT/ML injection Inject 230 Units into the skin daily with breakfast. 70 mL 11  . insulin regular (NOVOLIN R RELION) 100 units/mL injection Inject 0.6 mLs (60 Units total) into the skin daily with supper. 30 mL 11  . metolazone (ZAROXOLYN) 5 MG tablet Take 1 tablet by mouth once daily 90 tablet 0  . metoprolol tartrate (LOPRESSOR) 100 MG tablet Take 1 tablet by mouth twice daily 180 tablet 0  . ONETOUCH DELICA LANCETS 44I MISC 1 Device by Does not apply route 2 (two) times daily. 60 each 11  . potassium chloride (KLOR-CON) 10 MEQ tablet TAKE 3 TABLETS BY MOUTH THREE TIMES DAILY 270 tablet 0  . pravastatin (PRAVACHOL) 20 MG tablet TAKE 1 TABLET BY MOUTH ONCE DAILY BEFORE BREAKFAST 90 tablet 0  . prednisoLONE acetate (PRED FORTE) 1 % ophthalmic suspension Place 1 drop into the left eye 4 (four) times daily.    Marland Kitchen RELION INSULIN SYR 1CC/30G 30G X 5/16" 1 ML MISC USE ONE SYRINGE TO INJECT INSULIN SUBCUTANEOUSLY THREE TIMES DAILY AS DIRECTED 150 each 0  . RELION INSULIN SYRINGE 31G X 15/64" 1 ML MISC USE 1 SYRINGE THREE TIMES DAILY AS DIRECTED 150 each 0  . spironolactone (ALDACTONE) 25 MG tablet TAKE 1  TABLET BY MOUTH ONCE DAILY 90 tablet 1  . tobramycin-dexamethasone (TOBRADEX) ophthalmic solution Place 2 drops into the left eye every 4 (four) hours.    . torsemide (DEMADEX) 20 MG tablet Take 60 mg by mouth 2 (two) times daily.     Marland Kitchen HYDROcodone-acetaminophen (NORCO/VICODIN) 5-325 MG tablet One tablet every six hours for pain.  Limit 7 days. 28 tablet 0   No current facility-administered medications for this visit.     Physical Exam  Blood pressure (!) 142/67, pulse 61, height 5\' 3"  (1.6 m), weight 194 lb (  88 kg).  Constitutional: overall normal hygiene, normal nutrition, well developed, normal grooming, normal body habitus. Assistive device:none  Musculoskeletal: gait and station Limp none, muscle tone and strength are normal, no tremors or atrophy is present.  .  Neurological: coordination overall normal.  Deep tendon reflex/nerve stretch intact.  Sensation normal.  Cranial nerves II-XII intact.   Skin:   Normal overall no scars, lesions, ulcers or rashes. No psoriasis.  Psychiatric: Alert and oriented x 3.  Recent memory intact, remote memory unclear.  Normal mood and affect. Well groomed.  Good eye contact.  Cardiovascular: overall no swelling, no varicosities, no edema bilaterally, normal temperatures of the legs and arms, no clubbing, cyanosis and good capillary refill.  Lymphatic: palpation is normal.  Spine/Pelvis examination:  Inspection:  Overall, sacoiliac joint benign and hips nontender; without crepitus or defects.   Thoracic spine inspection: Alignment normal without kyphosis present   Lumbar spine inspection:  Alignment  with normal lumbar lordosis, without scoliosis apparent.   Thoracic spine palpation:  without tenderness of spinal processes   Lumbar spine palpation: without tenderness of lumbar area; without tightness of lumbar muscles    Range of Motion:   Lumbar flexion, forward flexion is normal without pain or tenderness    Lumbar extension is full  without pain or tenderness   Left lateral bend is normal without pain or tenderness   Right lateral bend is normal without pain or tenderness   Straight leg raising is normal  Strength & tone: normal   Stability overall normal stability  All other systems reviewed and are negative   The patient has been educated about the nature of the problem(s) and counseled on treatment options.  The patient appeared to understand what I have discussed and is in agreement with it.  Encounter Diagnoses  Name Primary?  . Chronic left-sided low back pain with left-sided sciatica Yes  . 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  Precautions discussed.  Continue current medications.   Return to clinic 3 months   I have reviewed the [place] web site prior to prescribing narcotic medicine for this patient.   Electronically Signed Sanjuana Kava, MD 2/2/20211:42 PM

## 2019-03-05 ENCOUNTER — Other Ambulatory Visit: Payer: Self-pay | Admitting: Endocrinology

## 2019-03-09 ENCOUNTER — Other Ambulatory Visit: Payer: Self-pay

## 2019-03-10 ENCOUNTER — Other Ambulatory Visit: Payer: Self-pay | Admitting: Family Medicine

## 2019-03-10 ENCOUNTER — Ambulatory Visit (HOSPITAL_COMMUNITY)
Admission: RE | Admit: 2019-03-10 | Discharge: 2019-03-10 | Disposition: A | Discharge status: Home / Self Care | Payer: PPO | Source: Ambulatory Visit | Attending: Family Medicine | Admitting: Family Medicine

## 2019-03-10 DIAGNOSIS — R928 Other abnormal and inconclusive findings on diagnostic imaging of breast: Secondary | ICD-10-CM

## 2019-03-10 DIAGNOSIS — N6002 Solitary cyst of left breast: Secondary | ICD-10-CM | POA: Diagnosis not present

## 2019-03-11 ENCOUNTER — Ambulatory Visit: Payer: PPO | Admitting: Endocrinology

## 2019-03-11 ENCOUNTER — Other Ambulatory Visit: Payer: Self-pay

## 2019-03-11 ENCOUNTER — Encounter: Payer: Self-pay | Admitting: Endocrinology

## 2019-03-11 VITALS — BP 110/70 | HR 68 | Ht 63.0 in | Wt 197.2 lb

## 2019-03-11 DIAGNOSIS — Z794 Long term (current) use of insulin: Secondary | ICD-10-CM | POA: Diagnosis not present

## 2019-03-11 DIAGNOSIS — E1140 Type 2 diabetes mellitus with diabetic neuropathy, unspecified: Secondary | ICD-10-CM

## 2019-03-11 DIAGNOSIS — E1165 Type 2 diabetes mellitus with hyperglycemia: Secondary | ICD-10-CM | POA: Diagnosis not present

## 2019-03-11 LAB — POCT GLYCOSYLATED HEMOGLOBIN (HGB A1C): Hemoglobin A1C: 7.4 % — AB (ref 4.0–5.6)

## 2019-03-11 MED ORDER — INSULIN REGULAR HUMAN 100 UNIT/ML IJ SOLN: 60.0000 [IU] | Freq: Every day | INTRAMUSCULAR | 11 refills | Status: DC

## 2019-03-11 MED ORDER — INSULIN REGULAR HUMAN 100 UNIT/ML IJ SOLN: 70.0000 [IU] | Freq: Every day | INTRAMUSCULAR | 11 refills | Status: DC

## 2019-03-11 NOTE — Patient Instructions (Addendum)
check your blood sugar twice a day.  vary the time of day when you check, between before the 3 meals, and at bedtime.  also check if you have symptoms of your blood sugar being too high or too low.  please keep a record of the readings and bring it to your next appointment here.  You can write it on any piece of paper.  please call us sooner if your blood sugar goes below 70, or if you have a lot of readings over 200.   Please continue the same 70/30 insulin: 230 units with breakfast.  However, take just 90 units on Sunday morning, and:  Please reduce the regular insulin to 60 units with supper.    On this type of insulin schedule, you should eat meals on a regular schedule.  If a meal is missed or significantly delayed, your blood sugar could go low.   Please come back for a follow-up appointment in 2 months.

## 2019-03-11 NOTE — Progress Notes (Signed)
Subjective:    Patient ID: Krista Strickland, female    DOB: Dec 17, 1950, 69 y.o.   MRN: [id number]  HPI Pt returns for f/u of diabetes.   DM type: insulin-requiring type 2 Dx'ed: 0938 Complications: polyneuropathy, renal insufficiency and CVA.  Therapy: insulin since 2010 GDM: never DKA: never Severe hypoglycemia: never.   Pancreatitis: never.   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 she cannot afford analogs, so we had to try 70/30 (the NPH component caused no reaction this time).  She takes 70/30 QAM and reg QPM, due to the pattern of cbg's.   Interval history:  no cbg record, but states cbg's vary from 60-300.  It is in general higher as the day goes on.  Pt takes 70-80 units of reg with supper.  pt states she feels well in general.  She says she never misses the insulin.  She recently had a steroid injection into the lower back Pt also has small multinodular goiter (euthyroid; f/u US in 2017 showed no change, with no thyroid nodule meeting criteria for biopsy or surveillance; she is euthyroid off rx).   Past Medical History:  Diagnosis Date  . Arthritis   . CKD (chronic kidney disease) stage 3, GFR 30-59 ml/min   . Diabetes mellitus, type 2 ([place])   . Essential hypertension   . GERD (gastroesophageal reflux disease)   . Gout   . History of MRSA infection 03/2009  . History of stroke 2013  . Hypercalcemia 2017   Managed by nephrology  . Hyperlipidemia   . Obesity   . Psoriasis   . Uterine cancer [facility])     Past Surgical History:  Procedure Laterality Date  . ABDOMINAL HYSTERECTOMY    . APPENDECTOMY  2012  . COLONOSCOPY WITH PROPOFOL N/A 08/24/2016   Procedure: COLONOSCOPY WITH PROPOFOL;  Surgeon: Rogene Houston, MD;  [facility];  Service: Endoscopy;  Laterality: N/A;  10:30  . Excison of right breast cyst    . LAPAROSCOPIC SALPINGO OOPHERECTOMY Right 04/22/2012   Procedure: LAPAROSCOPIC SALPINGO OOPHORECTOMY;  Surgeon: Jonnie Kind, MD;  Location: [facility] OR[facility];  Service: Gynecology;  Laterality: Right;  end 11:17  . MASS EXCISION N/A 04/22/2012   Procedure: EXCISION SKIN TAGS NECK AND HEAD;  Surgeon: Jonnie Kind, MD;  Location: [facility];  Service: Gynecology;  Laterality: N/A;  start 11:19  . PARTIAL HYSTERECTOMY    . POLYPECTOMY  08/24/2016   Procedure: POLYPECTOMY;  Surgeon: Rogene Houston, MD;  [facility];  Service: Endoscopy;;  colon  . Right neck biopsy      Social History   Socioeconomic History  . Marital status: Married    Spouse name: Not on file  . Number of children: 1  . Years of education: Not on file  . Highest education level: 11th grade  Occupational History  . Occupation: disabled     Fish farm manager: UNEMPLOYED  Tobacco Use  . Smoking status: Former Smoker    Packs/day: 0.25    Years: 12.00    Pack years: 3.00    Quit date: 07/16/1978    Years since quitting: 40.6  . Smokeless tobacco: Never Used  Substance and Sexual Activity  . Alcohol use: No  . Drug use: No  . Sexual activity: Yes    Birth control/protection: Surgical  Other Topics Concern  . Not on file  Social History Narrative  .  Not on file   Social Determinants of Health   Financial Resource Strain:   . Difficulty of Paying Living Expenses: Not on file  Food Insecurity:   . Worried About Charity fundraiser in the Last Year: Not on file  . Ran Out of Food in the Last Year: Not on file  Transportation Needs:   . Lack of Transportation (Medical): Not on file  . Lack of Transportation (Non-Medical): Not on file  Physical Activity:   . Days of Exercise per Week: Not on file  . Minutes of Exercise per Session: Not on file  Stress:   . Feeling of Stress : Not on file  Social Connections:   . Frequency of Communication with Friends and Family: Not on file  . Frequency of Social Gatherings with Friends and Family: Not on file  . Attends Religious  Services: Not on file  . Active Member of Clubs or Organizations: Not on file  . Attends Archivist Meetings: Not on file  . Marital Status: Not on file  Intimate Partner Violence:   . Fear of Current or Ex-Partner: Not on file  . Emotionally Abused: Not on file  . Physically Abused: Not on file  . Sexually Abused: Not on file    Current Outpatient Medications on File Prior to Visit  Medication Sig Dispense Refill  . allopurinol (ZYLOPRIM) 300 MG tablet Take 1 tablet by mouth once daily 90 tablet 5  . aspirin 325 MG tablet Take 1 tablet (325 mg total) by mouth daily.    . cyclobenzaprine (FLEXERIL) 10 MG tablet Take 10 mg by mouth 3 (three) times daily as needed for muscle spasms.     Marland Kitchen DILT-XR 180 MG 24 hr capsule Take 1 capsule by mouth once daily 90 capsule 0  . docusate sodium (COLACE) 100 MG capsule Take 100 mg by mouth 2 (two) times daily. Constipation    . ezetimibe (ZETIA) 10 MG tablet Take 1 tablet by mouth once daily 90 tablet 0  . gabapentin (NEURONTIN) 400 MG capsule Take 400 mg by mouth 3 (three) times daily.    Marland Kitchen glucose blood (ONETOUCH ULTRA) test strip USE 1 STRIP TO CHECK GLUCOSE TWICE DAILY. DX:E11.4 100 each 2  . hydrALAZINE (APRESOLINE) 25 MG tablet Take one tablet by mouth two times daily, at 9am and 9 pm 60 tablet 5  . HYDROcodone-acetaminophen (NORCO/VICODIN) 5-325 MG tablet One tablet every six hours for pain.  Limit 7 days. 28 tablet 0  . insulin NPH-regular Human (NOVOLIN 70/30 RELION) (70-30) 100 UNIT/ML injection Inject 230 Units into the skin daily with breakfast. 70 mL 11  . metolazone (ZAROXOLYN) 5 MG tablet Take 1 tablet by mouth once daily 90 tablet 0  . metoprolol tartrate (LOPRESSOR) 100 MG tablet Take 1 tablet by mouth twice daily 180 tablet 0  . ONETOUCH DELICA LANCETS 59F MISC 1 Device by Does not apply route 2 (two) times daily. 60 each 11  . potassium chloride (KLOR-CON) 10 MEQ tablet TAKE 3 TABLETS BY MOUTH THREE TIMES DAILY 270 tablet 0    . pravastatin (PRAVACHOL) 20 MG tablet TAKE 1 TABLET BY MOUTH ONCE DAILY BEFORE BREAKFAST 90 tablet 0  . prednisoLONE acetate (PRED FORTE) 1 % ophthalmic suspension Place 1 drop into the left eye 4 (four) times daily.    Marland Kitchen RELION INSULIN SYR 1CC/30G 30G X 5/16" 1 ML MISC USE ONE SYRINGE TO INJECT INSULIN SUBCUTANEOUSLY THREE TIMES DAILY AS DIRECTED 150 each 0  .  RELION INSULIN SYRINGE 31G X 15/64" 1 ML MISC USE 1 SYRINGE THREE TIMES DAILY AS DIRECTED 150 each 0  . spironolactone (ALDACTONE) 25 MG tablet TAKE 1 TABLET BY MOUTH ONCE DAILY 90 tablet 1  . tobramycin-dexamethasone (TOBRADEX) ophthalmic solution Place 2 drops into the left eye every 4 (four) hours.    . torsemide (DEMADEX) 20 MG tablet Take 60 mg by mouth 2 (two) times daily.      No current facility-administered medications on file prior to visit.    Allergies  Allergen Reactions  . Benazepril Swelling  . Metronidazole Hives  . Mobic [Meloxicam] Hives  . Penicillins Hives and Swelling    Has patient had a PCN reaction causing immediate rash, facial/tongue/throat swelling, SOB or lightheadedness with hypotension: No Has patient had a PCN reaction causing severe rash involving mucus membranes or skin necrosis: Yes Has patient had a PCN reaction that required hospitalization: No Has patient had a PCN reaction occurring within the last 10 years: No If all of the above answers are "NO", then may proceed with Cephalosporin use.   . Sulfonamide Derivatives Hives    Family History  Problem Relation Age of Onset  . Hypertension Brother   . Gout Brother   . Prostate cancer Brother   . Hypertension Brother   . Prostate cancer Brother   . Gout Brother   . Hypertension Sister   . Gout Sister   . Leukemia Sister 53  . Pancreatic cancer Sister 10  . Gout Sister   . Prostate cancer Brother   . Diabetes Neg Hx     BP 110/70 (BP Location: Left Arm, Patient Position: Sitting, Cuff Size: Large)   Pulse 68   Ht 5\' 3"  (1.6 m)    Wt 197 lb 3.2 oz (89.4 kg)   SpO2 95%   BMI 34.93 kg/m    Review of Systems Denies LOC    Objective:   Physical Exam VITAL SIGNS:  See vs page GENERAL: no distress Pulses: dorsalis pedis intact bilat.   MSK: no deformity of the feet CV: 2+ bilat leg edema Skin:  no ulcer on the feet.  normal color and temp on the feet.   Neuro: sensation is intact to touch on the feet, but decreased from normal Ext: there is bilateral onychomycosis of the toenails.    Lab Results  Component Value Date   HGBA1C 7.4 (A) 03/11/2019   Lab Results  Component Value Date   CREATININE 1.94 (H) 12/31/2018   BUN 56 (H) 12/31/2018   NA 135 12/31/2018   K 4.7 12/31/2018   CL 99 12/31/2018   CO2 27 12/31/2018       Assessment & Plan:  Insulin-requiring type 2 DM: this is the best control this pt should aim for, given this regimen, which does match insulin to her changing needs throughout the day Back pain: steroid injection is increasing A1c. Hypoglycemia: this limits aggressiveness of glycemic control.   Patient Instructions  check your blood sugar twice a day.  vary the time of day when you check, between before the 3 meals, and at bedtime.  also check if you have symptoms of your blood sugar being too high or too low.  please keep a record of the readings and bring it to your next appointment here.  You can write it on any piece of paper.  please call us sooner if your blood sugar goes below 70, or if you have a lot of readings over 200.  Please continue the same 70/30 insulin: 230 units with breakfast.  However, take just 90 units on Sunday morning, and:  Please reduce the regular insulin to 60 units with supper.    On this type of insulin schedule, you should eat meals on a regular schedule.  If a meal is missed or significantly delayed, your blood sugar could go low.   Please come back for a follow-up appointment in 2 months.

## 2019-03-20 ENCOUNTER — Other Ambulatory Visit: Payer: Self-pay | Admitting: Endocrinology

## 2019-03-23 DIAGNOSIS — E559 Vitamin D deficiency, unspecified: Secondary | ICD-10-CM | POA: Diagnosis not present

## 2019-03-23 DIAGNOSIS — Z79899 Other long term (current) drug therapy: Secondary | ICD-10-CM | POA: Diagnosis not present

## 2019-03-23 DIAGNOSIS — R809 Proteinuria, unspecified: Secondary | ICD-10-CM | POA: Diagnosis not present

## 2019-03-23 DIAGNOSIS — D631 Anemia in chronic kidney disease: Secondary | ICD-10-CM | POA: Diagnosis not present

## 2019-03-23 DIAGNOSIS — N184 Chronic kidney disease, stage 4 (severe): Secondary | ICD-10-CM | POA: Diagnosis not present

## 2019-03-25 DIAGNOSIS — E1140 Type 2 diabetes mellitus with diabetic neuropathy, unspecified: Secondary | ICD-10-CM | POA: Diagnosis not present

## 2019-03-26 ENCOUNTER — Telehealth: Payer: Self-pay | Admitting: Orthopaedic Surgery

## 2019-03-26 MED ORDER — HYDROCODONE-ACETAMINOPHEN 5-325 MG PO TABS: ORAL_TABLET | ORAL | 0 refills | Status: DC

## 2019-03-26 NOTE — Telephone Encounter (Signed)
Patient requests refill on Hydrocodone/Acetaminophen 5-325  Mgs.  Qty  28  Sig: One tablet every six hours for pain. Limit 7 days.  Patient states she uses Product/process development scientist in [place] IF THERE IS ANYTHING YOU CAN GIVE HER FOR MUSCLE SPASMS

## 2019-03-27 ENCOUNTER — Other Ambulatory Visit: Payer: Self-pay | Admitting: Family Medicine

## 2019-04-03 DIAGNOSIS — N189 Chronic kidney disease, unspecified: Secondary | ICD-10-CM | POA: Diagnosis not present

## 2019-04-03 DIAGNOSIS — E211 Secondary hyperparathyroidism, not elsewhere classified: Secondary | ICD-10-CM | POA: Diagnosis not present

## 2019-04-03 DIAGNOSIS — E1129 Type 2 diabetes mellitus with other diabetic kidney complication: Secondary | ICD-10-CM | POA: Diagnosis not present

## 2019-04-03 DIAGNOSIS — E870 Hyperosmolality and hypernatremia: Secondary | ICD-10-CM | POA: Diagnosis not present

## 2019-04-03 DIAGNOSIS — N170 Acute kidney failure with tubular necrosis: Secondary | ICD-10-CM | POA: Diagnosis not present

## 2019-04-03 DIAGNOSIS — D631 Anemia in chronic kidney disease: Secondary | ICD-10-CM | POA: Diagnosis not present

## 2019-04-03 DIAGNOSIS — E1122 Type 2 diabetes mellitus with diabetic chronic kidney disease: Secondary | ICD-10-CM | POA: Diagnosis not present

## 2019-04-03 DIAGNOSIS — R809 Proteinuria, unspecified: Secondary | ICD-10-CM | POA: Diagnosis not present

## 2019-04-03 DIAGNOSIS — Z79899 Other long term (current) drug therapy: Secondary | ICD-10-CM | POA: Diagnosis not present

## 2019-04-06 ENCOUNTER — Other Ambulatory Visit: Payer: Self-pay | Admitting: Endocrinology

## 2019-04-06 DIAGNOSIS — M79674 Pain in right toe(s): Secondary | ICD-10-CM | POA: Diagnosis not present

## 2019-04-06 DIAGNOSIS — M79675 Pain in left toe(s): Secondary | ICD-10-CM | POA: Diagnosis not present

## 2019-04-06 DIAGNOSIS — E1140 Type 2 diabetes mellitus with diabetic neuropathy, unspecified: Secondary | ICD-10-CM | POA: Diagnosis not present

## 2019-04-06 DIAGNOSIS — B351 Tinea unguium: Secondary | ICD-10-CM | POA: Diagnosis not present

## 2019-04-13 ENCOUNTER — Ambulatory Visit: Payer: PPO | Admitting: Family Medicine

## 2019-04-16 ENCOUNTER — Other Ambulatory Visit: Payer: Self-pay

## 2019-04-16 ENCOUNTER — Encounter: Payer: Self-pay | Admitting: Family Medicine

## 2019-04-16 ENCOUNTER — Ambulatory Visit (INDEPENDENT_AMBULATORY_CARE_PROVIDER_SITE_OTHER): Payer: PPO | Admitting: Family Medicine

## 2019-04-16 VITALS — BP 118/78 | HR 78 | Temp 97.7°F | Resp 15 | Ht 63.0 in | Wt 192.0 lb

## 2019-04-16 DIAGNOSIS — M5440 Lumbago with sciatica, unspecified side: Secondary | ICD-10-CM

## 2019-04-16 DIAGNOSIS — H209 Unspecified iridocyclitis: Secondary | ICD-10-CM | POA: Diagnosis not present

## 2019-04-16 DIAGNOSIS — Z794 Long term (current) use of insulin: Secondary | ICD-10-CM

## 2019-04-16 DIAGNOSIS — E1165 Type 2 diabetes mellitus with hyperglycemia: Secondary | ICD-10-CM | POA: Diagnosis not present

## 2019-04-16 DIAGNOSIS — I10 Essential (primary) hypertension: Secondary | ICD-10-CM | POA: Diagnosis not present

## 2019-04-16 DIAGNOSIS — G8929 Other chronic pain: Secondary | ICD-10-CM | POA: Diagnosis not present

## 2019-04-16 NOTE — Assessment & Plan Note (Signed)
Is followed by Ortho. Reports some increase trouble some days more than others

## 2019-04-16 NOTE — Assessment & Plan Note (Signed)
Is followed by Endo, better control recently. Continue medications at this time.

## 2019-04-16 NOTE — Progress Notes (Signed)
Subjective:  Patient ID: Krista Strickland, female    DOB: October 23, 1950  Age: 69 y.o. MRN: [id number]  CC:  Chief Complaint  Patient presents with  . Back Pain    started flaring up again months ago, had injection in feb without relief. Aggravating factors are standing walking and lifting. Relief from sitting. Hurts all the time if up and about. Describes it as shooting spasm pain. rates 8/10      HPI  HPI Krista Strickland is a 69 year old female patient of Dr. Griffin Dakin.  Well-known to the clinic.  Is here today for follow-up secondary to having blood pressure medicine adjusted at the beginning of the year.  Had hydralazine added back in January reports that she is tolerating this well and not having any issues with it.  Has been seen by cardiology and her nephrologist and both have reported that she has good findings.  Blood sugar levels have improved over the last year.  She continues to have trouble with her left eye-reports that she has to use her eyedrops if she forgets to use her eyedrops that she has trouble with it.  She reports that she has had both Covid vaccines.  And is doing well since then.  She denies having any skin issues or trouble with wounds or rashes.  Denies having any urinary tract infections or signs and symptoms of any infections.  Denies having any changes in her bowel or bladder habits.  Reports that she is eating well, drinking plenty of water and sleeping well.  She reports that her activity level is okay but she has a hard time when she has her back bothering her.  And she also has neuropathy which gives her trouble at times.  She denies having any recent falls.  Reports that her memory is good and she is not having trouble that she is aware of.  Overall she reports that she is doing well outside of her back pain.  But is being followed by orthopedist for this, and is prescribed medication for it.  Today patient denies signs and symptoms of COVID 19 infection  including fever, chills, cough, shortness of breath, and headache. Past Medical, Surgical, Social History, Allergies, and Medications have been Reviewed.   Past Medical History:  Diagnosis Date  . Annual physical exam 12/16/2013  . Arthritis   . CKD (chronic kidney disease) stage 3, GFR 30-59 ml/min   . Diabetes mellitus, type 2 ([place])   . Essential hypertension   . GERD (gastroesophageal reflux disease)   . Gout   . History of MRSA infection 03/2009  . History of stroke 2013  . Hypercalcemia 2017   Managed by nephrology  . Hyperlipidemia   . Obesity   . Psoriasis   . Special screening for malignant neoplasms, colon 07/06/2016   Added automatically from request for surgery [phone]  . Uterine cancer ([place])   . 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    Current Meds  Medication Sig  . allopurinol (ZYLOPRIM) 300 MG tablet Take 1 tablet by mouth once daily  . aspirin 325 MG tablet Take 1 tablet (325 mg total) by mouth daily.  . cyclobenzaprine (FLEXERIL) 10 MG tablet Take 10 mg by mouth 3 (three) times daily as needed for muscle spasms.   Marland Kitchen DILT-XR 180 MG 24 hr capsule Take 1 capsule by mouth once daily  . docusate sodium (COLACE) 100 MG capsule Take 100 mg by mouth 2 (two) times daily. Constipation  .  ezetimibe (ZETIA) 10 MG tablet Take 1 tablet by mouth once daily  . gabapentin (NEURONTIN) 400 MG capsule Take 400 mg by mouth 3 (three) times daily.  Marland Kitchen glucose blood (ONETOUCH ULTRA) test strip USE 1 STRIP TO CHECK GLUCOSE TWICE DAILY. DX:E11.4  . hydrALAZINE (APRESOLINE) 25 MG tablet Take one tablet by mouth two times daily, at 9am and 9 pm  . HYDROcodone-acetaminophen (NORCO/VICODIN) 5-325 MG tablet One tablet every six hours for pain.  Limit 7 days.  . metolazone (ZAROXOLYN) 5 MG tablet Take 1 tablet by mouth once daily  . metoprolol tartrate (LOPRESSOR) 100 MG tablet Take 1 tablet by mouth twice daily  . midodrine (PROAMATINE) 5 MG tablet Take 5 mg by mouth 3 (three) times daily.  Marland Kitchen  NOVOLIN 70/30 RELION (70-30) 100 UNIT/ML injection INJECT 190 UNITS SUBCUTANEOUSLY ONCE DAILY WITH BREAKFAST  . NOVOLIN R RELION 100 UNIT/ML injection INJECT 80 UNITS SUBCUTANEOUSLY ONCE DAILY WITH SUPPER  . ONETOUCH DELICA LANCETS 25O MISC 1 Device by Does not apply route 2 (two) times daily.  . potassium chloride (KLOR-CON) 10 MEQ tablet TAKE 3 TABLETS BY MOUTH THREE TIMES DAILY  . pravastatin (PRAVACHOL) 20 MG tablet TAKE 1 TABLET BY MOUTH ONCE DAILY BEFORE BREAKFAST  . RELION INSULIN SYR 1CC/30G 30G X 5/16" 1 ML MISC USE ONE SYRINGE TO INJECT INSULIN SUBCUTANEOUSLY THREE TIMES DAILY AS DIRECTED  . RELION INSULIN SYRINGE 31G X 15/64" 1 ML MISC USE 1 SYRINGE THREE TIMES DAILY AS DIRECTED  . spironolactone (ALDACTONE) 25 MG tablet TAKE 1 TABLET BY MOUTH ONCE DAILY  . tobramycin-dexamethasone (TOBRADEX) ophthalmic solution Place 2 drops into the left eye every 4 (four) hours.  . torsemide (DEMADEX) 20 MG tablet Take 60 mg by mouth 2 (two) times daily.     ROS:  Review of Systems  Constitutional: Negative.   HENT: Negative.   Eyes: Negative.   Respiratory: Negative.   Cardiovascular: Negative.   Gastrointestinal: Negative.   Genitourinary: Negative.   Musculoskeletal: Positive for back pain.  Skin: Negative.   Neurological: Negative.   Endo/Heme/Allergies: Negative.   Psychiatric/Behavioral: Negative.   All other systems reviewed and are negative.    Objective:   Today's Vitals: BP 118/78   Pulse 78   Temp 97.7 F (36.5 C) (Temporal)   Resp 15   Ht 5\' 3"  (1.6 m)   Wt 192 lb (87.1 kg)   SpO2 98%   BMI 34.01 kg/m  Vitals with BMI 04/16/2019 03/11/2019 03/03/2019  Height 5\' 3"  5\' 3"  5\' 3"   Weight 192 lbs 197 lbs 3 oz 194 lbs  BMI 34.02 03.70 48.88  Systolic 916 945 038  Diastolic 78 70 67  Pulse 78 68 61     Physical Exam Vitals and nursing note reviewed.  Constitutional:      Appearance: Normal appearance. She is obese.  HENT:     Head: Normocephalic and atraumatic.       Right Ear: External ear normal.     Left Ear: External ear normal.     Mouth/Throat:     Comments: Mask in place  Eyes:     General:        Right eye: No discharge.        Left eye: No discharge.     Conjunctiva/sclera: Conjunctivae normal.  Cardiovascular:     Rate and Rhythm: Normal rate and regular rhythm.     Pulses: Normal pulses.     Heart sounds: Normal heart sounds.  Pulmonary:  Effort: Pulmonary effort is normal.     Breath sounds: Normal breath sounds.  Musculoskeletal:     Cervical back: Normal range of motion and neck supple.     Comments: Overall back range of motion is intact.  Skin:    General: Skin is warm.  Neurological:     General: No focal deficit present.     Mental Status: She is alert and oriented to person, place, and time.  Psychiatric:        Mood and Affect: Mood normal.        Behavior: Behavior normal.        Thought Content: Thought content normal.        Judgment: Judgment normal.     Assessment   1. Essential hypertension   2. Type 2 diabetes mellitus with hyperglycemia, with long-term current use of insulin (HCC)   3. Chronic midline low back pain with sciatica, sciatica laterality unspecified   4. Iritis of left eye     Tests ordered No orders of the defined types were placed in this encounter.   Plan: Please see assessment and plan per problem list above.   No orders of the defined types were placed in this encounter.  20 minutes of face-to-face time. Patient to follow-up in 3 months  Perlie Mayo, NP

## 2019-04-16 NOTE — Patient Instructions (Addendum)
I appreciate the opportunity to provide you with care for your health and wellness. Today we discussed: follow up on BP  Follow up: 3 months   No labs or referrals today  Continue all medications at this time. Hope your back feels better.  Please continue to practice social distancing to keep you, your family, and our community safe.  If you must go out, please wear a mask and practice good handwashing.  It was a pleasure to see you and I look forward to continuing to work together on your health and well-being. Please do not hesitate to call the office if you need care or have questions about your care.  Have a wonderful day and week. With Gratitude, Cherly Beach, DNP, AGNP-BC

## 2019-04-16 NOTE — Assessment & Plan Note (Signed)
Krista Strickland is encouraged to maintain a well balanced diet that is low in salt. Controlled, continue current medication regimen.  No refills needed.  Additionally, she is also reminded that exercise is beneficial for heart health and control of  Blood pressure. 30-60 minutes daily is recommended-walking was suggested.

## 2019-04-20 DIAGNOSIS — H1820 Unspecified corneal edema: Secondary | ICD-10-CM | POA: Diagnosis not present

## 2019-04-20 DIAGNOSIS — H20022 Recurrent acute iridocyclitis, left eye: Secondary | ICD-10-CM | POA: Diagnosis not present

## 2019-04-20 DIAGNOSIS — H35033 Hypertensive retinopathy, bilateral: Secondary | ICD-10-CM | POA: Diagnosis not present

## 2019-04-20 DIAGNOSIS — H2513 Age-related nuclear cataract, bilateral: Secondary | ICD-10-CM | POA: Diagnosis not present

## 2019-05-11 ENCOUNTER — Ambulatory Visit: Payer: PPO | Admitting: Endocrinology

## 2019-05-11 ENCOUNTER — Encounter: Payer: Self-pay | Admitting: Endocrinology

## 2019-05-11 ENCOUNTER — Other Ambulatory Visit: Payer: Self-pay

## 2019-05-11 VITALS — BP 130/72 | HR 72 | Ht 63.0 in | Wt 166.2 lb

## 2019-05-11 DIAGNOSIS — E1140 Type 2 diabetes mellitus with diabetic neuropathy, unspecified: Secondary | ICD-10-CM

## 2019-05-11 DIAGNOSIS — Z794 Long term (current) use of insulin: Secondary | ICD-10-CM

## 2019-05-11 LAB — POCT GLYCOSYLATED HEMOGLOBIN (HGB A1C): Hemoglobin A1C: 7.8 % — AB (ref 4.0–5.6)

## 2019-05-11 MED ORDER — INSULIN REGULAR HUMAN 100 UNIT/ML IJ SOLN: 60.0000 [IU] | Freq: Every day | INTRAMUSCULAR | 0 refills | Status: DC

## 2019-05-11 MED ORDER — NOVOLIN 70/30 RELION (70-30) 100 UNIT/ML ~~LOC~~ SUSP: 200.0000 [IU] | Freq: Every day | SUBCUTANEOUS | 11 refills | Status: DC

## 2019-05-11 NOTE — Progress Notes (Signed)
Subjective:    Patient ID: Krista Strickland, female    DOB: Feb 08, 1950, 69 y.o.   MRN: [id number]  HPI Pt returns for f/u of diabetes.   DM type: insulin-requiring type 2 Dx'ed: 1275 Complications: polyneuropathy, renal insufficiency and CVA.  Therapy: insulin since 2010 GDM: never DKA: never Severe hypoglycemia: never.   Pancreatitis: never.   SDOH: she cannot afford analogs Other: she declines weight loss surgery; she had cutaneous reactions to NPH and lantus; she has declined MDI's. so we had to try 70/30 (the NPH component caused no reaction this time).  She takes 70/30 QAM and reg QPM, due to the pattern of cbg's.   Interval history:  no cbg record, but states cbg's vary from 64-363.  It is in general higher as the day goes on.  pt states she feels well in general.  She says she never misses the insulin.  no recent steroids.  Pt also has small multinodular goiter (euthyroid; f/u US in 2017 showed no change, with no thyroid nodule meeting criteria for biopsy or surveillance; she is euthyroid off rx).   Past Medical History:  Diagnosis Date  . Annual physical exam 12/16/2013  . Arthritis   . CKD (chronic kidney disease) stage 3, GFR 30-59 ml/min   . Diabetes mellitus, type 2 ([place])   . Essential hypertension   . GERD (gastroesophageal reflux disease)   . Gout   . History of MRSA infection 03/2009  . History of stroke 2013  . Hypercalcemia 2017   Managed by nephrology  . Hyperlipidemia   . Obesity   . Psoriasis   . Special screening for malignant neoplasms, colon 07/06/2016   Added automatically from request for surgery [phone]  . Uterine cancer ([place])   . 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 ABDOMINAL HYSTERECTOMY    . APPENDECTOMY  2012  . COLONOSCOPY WITH PROPOFOL N/A 08/24/2016   Procedure: COLONOSCOPY WITH PROPOFOL;  Surgeon: Rogene Houston, MD;  Location: [facility];  Service: Endoscopy;  Laterality: N/A;   10:30  . Excison of right breast cyst    . LAPAROSCOPIC SALPINGO OOPHERECTOMY Right 04/22/2012   Procedure: LAPAROSCOPIC SALPINGO OOPHORECTOMY;  Surgeon: Jonnie Kind, MD;  Location: [facility];  Service: Gynecology;  Laterality: Right;  end 11:17  . MASS EXCISION N/A 04/22/2012   Procedure: EXCISION SKIN TAGS NECK AND HEAD;  Surgeon: Jonnie Kind, MD;  Location: [facility];  Service: Gynecology;  Laterality: N/A;  start 11:19  . PARTIAL HYSTERECTOMY    . POLYPECTOMY  08/24/2016   Procedure: POLYPECTOMY;  Surgeon: Rogene Houston, MD;  Location: [facility];  Service: Endoscopy;;  colon  . Right neck biopsy      Social History   Socioeconomic History  . Marital status: Married    Spouse name: Not on file  . Number of children: 1  . Years of education: Not on file  . Highest education level: 11th grade  Occupational History  . Occupation: disabled     Fish farm manager: UNEMPLOYED  Tobacco Use  . Smoking status: Former Smoker    Packs/day: 0.25    Years: 12.00    Pack years: 3.00    Quit date: 07/16/1978    Years since quitting: 40.8  . Smokeless tobacco: Never Used  Substance and Sexual Activity  . Alcohol use: No  . Drug use: No  . Sexual activity: Yes    Birth  control/protection: Surgical  Other Topics Concern  . Not on file  Social History Narrative  . Not on file   Social Determinants of Health   Financial Resource Strain:   . Difficulty of Paying Living Expenses:   Food Insecurity:   . Worried About Charity fundraiser in the Last Year:   . Arboriculturist in the Last Year:   Transportation Needs:   . Film/video editor (Medical):   Marland Kitchen Lack of Transportation (Non-Medical):   Physical Activity:   . Days of Exercise per Week:   . Minutes of Exercise per Session:   Stress:   . Feeling of Stress :   Social Connections:   . Frequency of Communication with Friends and Family:   . Frequency of Social Gatherings with Friends and Family:   . Attends Religious Services:     . Active Member of Clubs or Organizations:   . Attends Archivist Meetings:   Marland Kitchen Marital Status:   Intimate Partner Violence:   . Fear of Current or Ex-Partner:   . Emotionally Abused:   Marland Kitchen Physically Abused:   . Sexually Abused:     Current Outpatient Medications on File Prior to Visit  Medication Sig Dispense Refill  . allopurinol (ZYLOPRIM) 300 MG tablet Take 1 tablet by mouth once daily 90 tablet 5  . aspirin 325 MG tablet Take 1 tablet (325 mg total) by mouth daily.    . cyclobenzaprine (FLEXERIL) 10 MG tablet Take 10 mg by mouth 3 (three) times daily as needed for muscle spasms.     Marland Kitchen DILT-XR 180 MG 24 hr capsule Take 1 capsule by mouth once daily 90 capsule 0  . docusate sodium (COLACE) 100 MG capsule Take 100 mg by mouth 2 (two) times daily. Constipation    . ezetimibe (ZETIA) 10 MG tablet Take 1 tablet by mouth once daily 90 tablet 0  . gabapentin (NEURONTIN) 400 MG capsule Take 400 mg by mouth 3 (three) times daily.    Marland Kitchen glucose blood (ONETOUCH ULTRA) test strip USE 1 STRIP TO CHECK GLUCOSE TWICE DAILY. DX:E11.4 100 each 2  . hydrALAZINE (APRESOLINE) 25 MG tablet Take one tablet by mouth two times daily, at 9am and 9 pm 60 tablet 5  . HYDROcodone-acetaminophen (NORCO/VICODIN) 5-325 MG tablet One tablet every six hours for pain.  Limit 7 days. 28 tablet 0  . metolazone (ZAROXOLYN) 5 MG tablet Take 1 tablet by mouth once daily 90 tablet 0  . metoprolol tartrate (LOPRESSOR) 100 MG tablet Take 1 tablet by mouth twice daily 180 tablet 0  . midodrine (PROAMATINE) 5 MG tablet Take 5 mg by mouth 3 (three) times daily.    Glory Rosebush DELICA LANCETS 19J MISC 1 Device by Does not apply route 2 (two) times daily. 60 each 11  . potassium chloride (KLOR-CON) 10 MEQ tablet TAKE 3 TABLETS BY MOUTH THREE TIMES DAILY 270 tablet 0  . pravastatin (PRAVACHOL) 20 MG tablet TAKE 1 TABLET BY MOUTH ONCE DAILY BEFORE BREAKFAST 90 tablet 0  . prednisoLONE acetate (PRED FORTE) 1 % ophthalmic  suspension Place 1 drop into the left eye 4 (four) times daily.    Marland Kitchen RELION INSULIN SYR 1CC/30G 30G X 5/16" 1 ML MISC USE ONE SYRINGE TO INJECT INSULIN SUBCUTANEOUSLY THREE TIMES DAILY AS DIRECTED 150 each 0  . RELION INSULIN SYRINGE 31G X 15/64" 1 ML MISC USE 1 SYRINGE THREE TIMES DAILY AS DIRECTED 150 each 0  . spironolactone (ALDACTONE) 25  MG tablet TAKE 1 TABLET BY MOUTH ONCE DAILY 90 tablet 1  . tobramycin-dexamethasone (TOBRADEX) ophthalmic solution Place 2 drops into the left eye every 4 (four) hours.    . torsemide (DEMADEX) 20 MG tablet Take 60 mg by mouth 2 (two) times daily.      No current facility-administered medications on file prior to visit.    Allergies  Allergen Reactions  . Benazepril Swelling  . Metronidazole Hives  . Mobic [Meloxicam] Hives  . Penicillins Hives and Swelling    Has patient had a PCN reaction causing immediate rash, facial/tongue/throat swelling, SOB or lightheadedness with hypotension: No Has patient had a PCN reaction causing severe rash involving mucus membranes or skin necrosis: Yes Has patient had a PCN reaction that required hospitalization: No Has patient had a PCN reaction occurring within the last 10 years: No If all of the above answers are "NO", then may proceed with Cephalosporin use.   . Sulfonamide Derivatives Hives    Family History  Problem Relation Age of Onset  . Hypertension Brother   . Gout Brother   . Prostate cancer Brother   . Hypertension Brother   . Prostate cancer Brother   . Gout Brother   . Hypertension Sister   . Gout Sister   . Leukemia Sister 62  . Pancreatic cancer Sister 36  . Gout Sister   . Prostate cancer Brother   . Diabetes Neg Hx     BP 130/72 (BP Location: Left Arm, Patient Position: Sitting, Cuff Size: Large)   Pulse 72   Ht 5\' 3"  (1.6 m)   Wt 166 lb 3.2 oz (75.4 kg)   SpO2 96%   BMI 29.44 kg/m    Review of Systems Denies LOC    Objective:   Physical Exam VITAL SIGNS:  See vs  page GENERAL: no distress Pulses: dorsalis pedis intact bilat.   MSK: no deformity of the feet CV: 1+ bilat leg edema Skin:  no ulcer on the feet.  normal color and temp on the feet. Neuro: sensation is intact to touch on the feet.     Lab Results  Component Value Date   HGBA1C 7.8 (A) 05/11/2019   Lab Results  Component Value Date   CREATININE 1.94 (H) 12/31/2018   BUN 56 (H) 12/31/2018   NA 135 12/31/2018   K 4.7 12/31/2018   CL 99 12/31/2018   CO2 27 12/31/2018       Assessment & Plan:  Insulin-requiring type 2 DM, with CVA CRI: PM insulin should be regular. SDOH: she cannot afford analogs  Patient Instructions  check your blood sugar twice a day.  vary the time of day when you check, between before the 3 meals, and at bedtime.  also check if you have symptoms of your blood sugar being too high or too low.  please keep a record of the readings and bring it to your next appointment here.  You can write it on any piece of paper.  please call us sooner if your blood sugar goes below 70, or if you have a lot of readings over 200.   Please increase the 70/30 insulin: 200 units with breakfast.  However, take just 90 units on Sunday morning, and:  Please reduce the regular insulin to 60 units with supper.    On this type of insulin schedule, you should eat meals on a regular schedule.  If a meal is missed or significantly delayed, your blood sugar could go low.  Please come back for a follow-up appointment in 2 months.

## 2019-05-11 NOTE — Patient Instructions (Addendum)
check your blood sugar twice a day.  vary the time of day when you check, between before the 3 meals, and at bedtime.  also check if you have symptoms of your blood sugar being too high or too low.  please keep a record of the readings and bring it to your next appointment here.  You can write it on any piece of paper.  please call us sooner if your blood sugar goes below 70, or if you have a lot of readings over 200.   Please increase the 70/30 insulin: 200 units with breakfast.  However, take just 90 units on Sunday morning, and:  Please reduce the regular insulin to 60 units with supper.    On this type of insulin schedule, you should eat meals on a regular schedule.  If a meal is missed or significantly delayed, your blood sugar could go low.   Please come back for a follow-up appointment in 2 months.

## 2019-05-13 ENCOUNTER — Other Ambulatory Visit: Payer: Self-pay | Admitting: Family Medicine

## 2019-05-13 DIAGNOSIS — H35033 Hypertensive retinopathy, bilateral: Secondary | ICD-10-CM | POA: Diagnosis not present

## 2019-05-13 DIAGNOSIS — H1820 Unspecified corneal edema: Secondary | ICD-10-CM | POA: Diagnosis not present

## 2019-05-13 DIAGNOSIS — H2513 Age-related nuclear cataract, bilateral: Secondary | ICD-10-CM | POA: Diagnosis not present

## 2019-05-13 DIAGNOSIS — H20022 Recurrent acute iridocyclitis, left eye: Secondary | ICD-10-CM | POA: Diagnosis not present

## 2019-05-15 DIAGNOSIS — E119 Type 2 diabetes mellitus without complications: Secondary | ICD-10-CM | POA: Diagnosis not present

## 2019-05-15 DIAGNOSIS — H1820 Unspecified corneal edema: Secondary | ICD-10-CM | POA: Diagnosis not present

## 2019-05-15 DIAGNOSIS — H2513 Age-related nuclear cataract, bilateral: Secondary | ICD-10-CM | POA: Diagnosis not present

## 2019-05-15 DIAGNOSIS — H20022 Recurrent acute iridocyclitis, left eye: Secondary | ICD-10-CM | POA: Diagnosis not present

## 2019-05-15 DIAGNOSIS — H35033 Hypertensive retinopathy, bilateral: Secondary | ICD-10-CM | POA: Diagnosis not present

## 2019-05-29 ENCOUNTER — Other Ambulatory Visit: Payer: Self-pay | Admitting: Endocrinology

## 2019-06-01 ENCOUNTER — Other Ambulatory Visit: Payer: Self-pay | Admitting: Family Medicine

## 2019-06-02 ENCOUNTER — Ambulatory Visit: Payer: PPO | Admitting: Orthopaedic Surgery

## 2019-06-02 ENCOUNTER — Other Ambulatory Visit: Payer: Self-pay

## 2019-06-02 ENCOUNTER — Encounter: Payer: Self-pay | Admitting: Orthopaedic Surgery

## 2019-06-02 VITALS — BP 129/66 | HR 73 | Temp 96.7°F | Wt 191.0 lb

## 2019-06-02 DIAGNOSIS — E0849 Diabetes mellitus due to underlying condition with other diabetic neurological complication: Secondary | ICD-10-CM

## 2019-06-02 DIAGNOSIS — G8929 Other chronic pain: Secondary | ICD-10-CM | POA: Diagnosis not present

## 2019-06-02 DIAGNOSIS — M5442 Lumbago with sciatica, left side: Secondary | ICD-10-CM | POA: Diagnosis not present

## 2019-06-02 MED ORDER — NAPROXEN 500 MG PO TABS: 500.0000 mg | ORAL_TABLET | Freq: Two times a day (BID) | ORAL | 5 refills | Status: DC

## 2019-06-02 MED ORDER — HYDROCODONE-ACETAMINOPHEN 5-325 MG PO TABS: ORAL_TABLET | ORAL | 0 refills | Status: DC

## 2019-06-02 NOTE — Progress Notes (Signed)
Patient Krista Strickland, female DOB:10-24-1950, 69 y.o. HWE:[id number]  Chief Complaint  Patient presents with  . Back Pain    f/u back pain    HPI  Krista Strickland is a 69 y.o. female who has a flare up of her lower back pain.  She has more pain centrally in the lower back. She has no new trauma, no weakness.    Her recent A1C was 7.5 she says.  That is a good improvement.  I will renew her pain medicine and start her back on Naprosyn.  She may need PT or MRI depending on how she does.   Body mass index is 33.83 kg/m.  ROS  Review of Systems  Constitutional: Positive for activity change.  Musculoskeletal: Positive for arthralgias, back pain, gait problem and joint swelling.  Neurological: Tremors:  .wkl.  All other systems reviewed and are negative.   All other systems reviewed and are negative.  The following is a summary of the past history medically, past history surgically, known current medicines, social history and family history.  This information is gathered electronically by the computer from prior information and documentation.  I review this each visit and have found including this information at this point in the chart is beneficial and informative.    Past Medical History:  Diagnosis Date  . Annual physical exam 12/16/2013  . Arthritis   . CKD (chronic kidney disease) stage 3, GFR 30-59 ml/min   . Diabetes mellitus, type 2 ([place])   . Essential hypertension   . GERD (gastroesophageal reflux disease)   . Gout   . History of MRSA infection 03/2009  . History of stroke 2013  . Hypercalcemia 2017   Managed by nephrology  . Hyperlipidemia   . Obesity   . Psoriasis   . Special screening for malignant neoplasms, colon 07/06/2016   Added automatically from request for surgery [phone]  . Uterine cancer ([place])   . 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 ABDOMINAL HYSTERECTOMY    . APPENDECTOMY  2012  .  COLONOSCOPY WITH PROPOFOL N/A 08/24/2016   Procedure: COLONOSCOPY WITH PROPOFOL;  Surgeon: Rogene Houston, MD;  [facility];  Service: Endoscopy;  Laterality: N/A;  10:30  . Excison of right breast cyst    . LAPAROSCOPIC SALPINGO OOPHERECTOMY Right 04/22/2012   Procedure: LAPAROSCOPIC SALPINGO OOPHORECTOMY;  Surgeon: Jonnie Kind, MD;  Location: [facility];  Service: Gynecology;  Laterality: Right;  end 11:17  . MASS EXCISION N/A 04/22/2012   Procedure: EXCISION SKIN TAGS NECK AND HEAD;  Surgeon: Jonnie Kind, MD;  Location: [facility];  Service: Gynecology;  Laterality: N/A;  start 11:19  . PARTIAL HYSTERECTOMY    . POLYPECTOMY  08/24/2016   Procedure: POLYPECTOMY;  Surgeon: Rogene Houston, MD;  [facility];  Service: Endoscopy;;  colon  . Right neck biopsy      Family History  Problem Relation Age of Onset  . Hypertension Brother   . Gout Brother   . Prostate cancer Brother   . Hypertension Brother   . Prostate cancer Brother   . Gout Brother   . Hypertension Sister   . Gout Sister   . Leukemia Sister 59  . Pancreatic cancer Sister 4  . Gout Sister   . Prostate cancer Brother   . Diabetes Neg Hx     Social History Social History   Tobacco Use  .  Smoking status: Former Smoker    Packs/day: 0.25    Years: 12.00    Pack years: 3.00    Quit date: 07/16/1978    Years since quitting: 40.9  . Smokeless tobacco: Never Used  Substance Use Topics  . Alcohol use: No  . Drug use: No    Allergies  Allergen Reactions  . Benazepril Swelling  . Metronidazole Hives  . Mobic [Meloxicam] Hives  . Penicillins Hives and Swelling    Has patient had a PCN reaction causing immediate rash, facial/tongue/throat swelling, SOB or lightheadedness with hypotension: No Has patient had a PCN reaction causing severe rash involving mucus membranes or skin necrosis: Yes Has patient had a PCN reaction that required hospitalization: No Has patient had a PCN reaction  occurring within the last 10 years: No If all of the above answers are "NO", then may proceed with Cephalosporin use.   . Sulfonamide Derivatives Hives    Current Outpatient Medications  Medication Sig Dispense Refill  . allopurinol (ZYLOPRIM) 300 MG tablet Take 1 tablet by mouth once daily 90 tablet 5  . aspirin 325 MG tablet Take 1 tablet (325 mg total) by mouth daily.    . cyclobenzaprine (FLEXERIL) 10 MG tablet Take 10 mg by mouth 3 (three) times daily as needed for muscle spasms.     Marland Kitchen DILT-XR 180 MG 24 hr capsule Take 1 capsule by mouth once daily 90 capsule 0  . docusate sodium (COLACE) 100 MG capsule Take 100 mg by mouth 2 (two) times daily. Constipation    . gabapentin (NEURONTIN) 400 MG capsule Take 400 mg by mouth 3 (three) times daily.    Marland Kitchen glucose blood (ONETOUCH ULTRA) test strip USE 1 STRIP TO CHECK GLUCOSE TWICE DAILY. DX:E11.4 100 each 2  . hydrALAZINE (APRESOLINE) 25 MG tablet Take one tablet by mouth two times daily, at 9am and 9 pm 60 tablet 5  . HYDROcodone-acetaminophen (NORCO/VICODIN) 5-325 MG tablet One tablet every six hours for pain.  Limit 7 days. 28 tablet 0  . metolazone (ZAROXOLYN) 5 MG tablet Take 1 tablet by mouth once daily 90 tablet 0  . metoprolol tartrate (LOPRESSOR) 100 MG tablet Take 1 tablet by mouth twice daily 180 tablet 0  . midodrine (PROAMATINE) 5 MG tablet Take 5 mg by mouth 3 (three) times daily.    Marland Kitchen NOVOLIN 70/30 RELION (70-30) 100 UNIT/ML injection INJECT 190 UNITS SUBCUTANEOUSLY ONCE DAILY WITH BREAKFAST 70 mL 0  . NOVOLIN R RELION 100 UNIT/ML injection INJECT 80 UNITS SUBCUTANEOUSLY ONCE DAILY WITH SUPPER 30 mL 0  . ONETOUCH DELICA LANCETS 67R MISC 1 Device by Does not apply route 2 (two) times daily. 60 each 11  . potassium chloride (KLOR-CON) 10 MEQ tablet TAKE 3 TABLETS BY MOUTH THREE TIMES DAILY 270 tablet 0  . pravastatin (PRAVACHOL) 20 MG tablet TAKE 1 TABLET BY MOUTH ONCE DAILY BEFORE BREAKFAST 90 tablet 0  . prednisoLONE acetate  (PRED FORTE) 1 % ophthalmic suspension Place 1 drop into the left eye 4 (four) times daily.    Marland Kitchen RELION INSULIN SYR 1CC/30G 30G X 5/16" 1 ML MISC USE ONE SYRINGE TO INJECT INSULIN SUBCUTANEOUSLY THREE TIMES DAILY AS DIRECTED 150 each 0  . RELION INSULIN SYRINGE 31G X 15/64" 1 ML MISC USE 1 SYRINGE THREE TIMES DAILY AS DIRECTED 150 each 0  . spironolactone (ALDACTONE) 25 MG tablet TAKE 1 TABLET BY MOUTH ONCE DAILY 90 tablet 1  . tobramycin-dexamethasone (TOBRADEX) ophthalmic solution Place 2 drops into the  left eye every 4 (four) hours.    . torsemide (DEMADEX) 20 MG tablet Take 60 mg by mouth 2 (two) times daily.     Marland Kitchen ezetimibe (ZETIA) 10 MG tablet Take 1 tablet by mouth once daily 90 tablet 0  . naproxen (NAPROSYN) 500 MG tablet Take 1 tablet (500 mg total) by mouth 2 (two) times daily with a meal. 60 tablet 5   No current facility-administered medications for this visit.     Physical Exam  Blood pressure 129/66, pulse 73, temperature (!) 96.7 F (35.9 C), weight 191 lb (86.6 kg).  Constitutional: overall normal hygiene, normal nutrition, well developed, normal grooming, normal body habitus. Assistive device:none  Musculoskeletal: gait and station Limp none, muscle tone and strength are normal, no tremors or atrophy is present.  .  Neurological: coordination overall normal.  Deep tendon reflex/nerve stretch intact.  Sensation normal.  Cranial nerves II-XII intact.   Skin:   Normal overall no scars, lesions, ulcers or rashes. No psoriasis.  Psychiatric: Alert and oriented x 3.  Recent memory intact, remote memory unclear.  Normal mood and affect. Well groomed.  Good eye contact.  Cardiovascular: overall no swelling, no varicosities, no edema bilaterally, normal temperatures of the legs and arms, no clubbing, cyanosis and good capillary refill.  Lymphatic: palpation is normal.  Spine/Pelvis examination:  Inspection:  Overall, sacoiliac joint benign and hips nontender; without  crepitus or defects.   Thoracic spine inspection: Alignment normal without kyphosis present   Lumbar spine inspection:  Alignment  with normal lumbar lordosis, without scoliosis apparent.   Thoracic spine palpation:  without tenderness of spinal processes   Lumbar spine palpation: without tenderness of lumbar area; without tightness of lumbar muscles    Range of Motion:   Lumbar flexion, forward flexion is normal without pain or tenderness    Lumbar extension is full without pain or tenderness   Left lateral bend is normal without pain or tenderness   Right lateral bend is normal without pain or tenderness   Straight leg raising is normal  Strength & tone: normal   Stability overall normal stability  All other systems reviewed and are negative   The patient has been educated about the nature of the problem(s) and counseled on treatment options.  The patient appeared to understand what I have discussed and is in agreement with it.  Encounter Diagnoses  Name Primary?  . Chronic left-sided low back pain with left-sided sciatica Yes  . 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  Precautions discussed.  Continue current medications. Begin Naprosyn.  Return to clinic 1 month   I have reviewed the [place] web site prior to prescribing narcotic medicine for this patient.   Electronically Signed Sanjuana Kava, MD 5/4/20212:30 PM

## 2019-06-03 DIAGNOSIS — E211 Secondary hyperparathyroidism, not elsewhere classified: Secondary | ICD-10-CM | POA: Diagnosis not present

## 2019-06-03 DIAGNOSIS — D631 Anemia in chronic kidney disease: Secondary | ICD-10-CM | POA: Diagnosis not present

## 2019-06-03 DIAGNOSIS — E1129 Type 2 diabetes mellitus with other diabetic kidney complication: Secondary | ICD-10-CM | POA: Diagnosis not present

## 2019-06-03 DIAGNOSIS — N189 Chronic kidney disease, unspecified: Secondary | ICD-10-CM | POA: Diagnosis not present

## 2019-06-03 DIAGNOSIS — R809 Proteinuria, unspecified: Secondary | ICD-10-CM | POA: Diagnosis not present

## 2019-06-03 DIAGNOSIS — E870 Hyperosmolality and hypernatremia: Secondary | ICD-10-CM | POA: Diagnosis not present

## 2019-06-03 DIAGNOSIS — N170 Acute kidney failure with tubular necrosis: Secondary | ICD-10-CM | POA: Diagnosis not present

## 2019-06-03 DIAGNOSIS — E1122 Type 2 diabetes mellitus with diabetic chronic kidney disease: Secondary | ICD-10-CM | POA: Diagnosis not present

## 2019-06-03 DIAGNOSIS — Z79899 Other long term (current) drug therapy: Secondary | ICD-10-CM | POA: Diagnosis not present

## 2019-06-05 DIAGNOSIS — E211 Secondary hyperparathyroidism, not elsewhere classified: Secondary | ICD-10-CM | POA: Diagnosis not present

## 2019-06-05 DIAGNOSIS — D631 Anemia in chronic kidney disease: Secondary | ICD-10-CM | POA: Diagnosis not present

## 2019-06-05 DIAGNOSIS — R809 Proteinuria, unspecified: Secondary | ICD-10-CM | POA: Diagnosis not present

## 2019-06-05 DIAGNOSIS — I129 Hypertensive chronic kidney disease with stage 1 through stage 4 chronic kidney disease, or unspecified chronic kidney disease: Secondary | ICD-10-CM | POA: Diagnosis not present

## 2019-06-05 DIAGNOSIS — E876 Hypokalemia: Secondary | ICD-10-CM | POA: Diagnosis not present

## 2019-06-05 DIAGNOSIS — E1122 Type 2 diabetes mellitus with diabetic chronic kidney disease: Secondary | ICD-10-CM | POA: Diagnosis not present

## 2019-06-05 DIAGNOSIS — N189 Chronic kidney disease, unspecified: Secondary | ICD-10-CM | POA: Diagnosis not present

## 2019-06-05 DIAGNOSIS — R600 Localized edema: Secondary | ICD-10-CM | POA: Diagnosis not present

## 2019-06-05 DIAGNOSIS — E1129 Type 2 diabetes mellitus with other diabetic kidney complication: Secondary | ICD-10-CM | POA: Diagnosis not present

## 2019-06-15 DIAGNOSIS — M79675 Pain in left toe(s): Secondary | ICD-10-CM | POA: Diagnosis not present

## 2019-06-15 DIAGNOSIS — M79674 Pain in right toe(s): Secondary | ICD-10-CM | POA: Diagnosis not present

## 2019-06-15 DIAGNOSIS — E1140 Type 2 diabetes mellitus with diabetic neuropathy, unspecified: Secondary | ICD-10-CM | POA: Diagnosis not present

## 2019-06-15 DIAGNOSIS — B351 Tinea unguium: Secondary | ICD-10-CM | POA: Diagnosis not present

## 2019-06-18 DIAGNOSIS — R600 Localized edema: Secondary | ICD-10-CM | POA: Diagnosis not present

## 2019-06-18 DIAGNOSIS — E876 Hypokalemia: Secondary | ICD-10-CM | POA: Diagnosis not present

## 2019-06-18 DIAGNOSIS — E211 Secondary hyperparathyroidism, not elsewhere classified: Secondary | ICD-10-CM | POA: Diagnosis not present

## 2019-06-18 DIAGNOSIS — D631 Anemia in chronic kidney disease: Secondary | ICD-10-CM | POA: Diagnosis not present

## 2019-06-18 DIAGNOSIS — I129 Hypertensive chronic kidney disease with stage 1 through stage 4 chronic kidney disease, or unspecified chronic kidney disease: Secondary | ICD-10-CM | POA: Diagnosis not present

## 2019-06-18 DIAGNOSIS — E1129 Type 2 diabetes mellitus with other diabetic kidney complication: Secondary | ICD-10-CM | POA: Diagnosis not present

## 2019-06-18 DIAGNOSIS — R809 Proteinuria, unspecified: Secondary | ICD-10-CM | POA: Diagnosis not present

## 2019-06-18 DIAGNOSIS — E1122 Type 2 diabetes mellitus with diabetic chronic kidney disease: Secondary | ICD-10-CM | POA: Diagnosis not present

## 2019-06-18 DIAGNOSIS — N189 Chronic kidney disease, unspecified: Secondary | ICD-10-CM | POA: Diagnosis not present

## 2019-06-23 ENCOUNTER — Other Ambulatory Visit: Payer: Self-pay | Admitting: Family Medicine

## 2019-06-29 ENCOUNTER — Other Ambulatory Visit: Payer: Self-pay | Admitting: Family Medicine

## 2019-07-02 ENCOUNTER — Encounter: Payer: Self-pay | Admitting: Orthopaedic Surgery

## 2019-07-02 ENCOUNTER — Other Ambulatory Visit: Payer: Self-pay

## 2019-07-02 ENCOUNTER — Ambulatory Visit: Payer: PPO | Admitting: Orthopaedic Surgery

## 2019-07-02 VITALS — BP 142/56 | HR 78 | Ht 63.0 in | Wt 193.0 lb

## 2019-07-02 DIAGNOSIS — G8929 Other chronic pain: Secondary | ICD-10-CM | POA: Diagnosis not present

## 2019-07-02 DIAGNOSIS — M5442 Lumbago with sciatica, left side: Secondary | ICD-10-CM

## 2019-07-02 DIAGNOSIS — E0849 Diabetes mellitus due to underlying condition with other diabetic neurological complication: Secondary | ICD-10-CM

## 2019-07-02 NOTE — Progress Notes (Signed)
Patient Krista Strickland, female DOB:07-07-1950, 69 y.o. AVW:[id number]  Chief Complaint  Patient presents with  . Back Pain    bad day today     HPI  Krista Strickland is a 69 y.o. female who has chronic lower back pain that is getting worse.  She had MRI in December 2020 and has had epidurals by Dr. Ernestina Patches. She is no better. She has pain most of the time and has spasm at times.  She is on pain medicine and Flexeril which barely help. She has more pain on cold rainy days and with weather changes.  She has no new trauma or weakness.  I will have her evaluated by neurosurgeon.  She is agreeable.   Body mass index is 34.19 kg/m.  ROS  Review of Systems  Constitutional: Positive for activity change.  Musculoskeletal: Positive for arthralgias, back pain, gait problem and joint swelling.  Neurological: Tremors:  .wkl.  All other systems reviewed and are negative.   All other systems reviewed and are negative.  The following is a summary of the past history medically, past history surgically, known current medicines, social history and family history.  This information is gathered electronically by the computer from prior information and documentation.  I review this each visit and have found including this information at this point in the chart is beneficial and informative.    Past Medical History:  Diagnosis Date  . Annual physical exam 12/16/2013  . Arthritis   . CKD (chronic kidney disease) stage 3, GFR 30-59 ml/min   . Diabetes mellitus, type 2 ([place])   . Essential hypertension   . GERD (gastroesophageal reflux disease)   . Gout   . History of MRSA infection 03/2009  . History of stroke 2013  . Hypercalcemia 2017   Managed by nephrology  . Hyperlipidemia   . Obesity   . Psoriasis   . Special screening for malignant neoplasms, colon 07/06/2016   Added automatically from request for surgery [phone]  . Uterine cancer ([place])   . 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    Past  Surgical History:  Procedure Laterality Date  . ABDOMINAL HYSTERECTOMY    . APPENDECTOMY  2012  . COLONOSCOPY WITH PROPOFOL N/A 08/24/2016   Procedure: COLONOSCOPY WITH PROPOFOL;  Surgeon: Rogene Houston, MD;  [facility];  Service: Endoscopy;  Laterality: N/A;  10:30  . Excison of right breast cyst    . LAPAROSCOPIC SALPINGO OOPHERECTOMY Right 04/22/2012   Procedure: LAPAROSCOPIC SALPINGO OOPHORECTOMY;  Surgeon: Jonnie Kind, MD;  Location: [facility];  Service: Gynecology;  Laterality: Right;  end 11:17  . MASS EXCISION N/A 04/22/2012   Procedure: EXCISION SKIN TAGS NECK AND HEAD;  Surgeon: Jonnie Kind, MD;  Location: [facility];  Service: Gynecology;  Laterality: N/A;  start 11:19  . PARTIAL HYSTERECTOMY    . POLYPECTOMY  08/24/2016   Procedure: POLYPECTOMY;  Surgeon: Rogene Houston, MD;  [facility];  Service: Endoscopy;;  colon  . Right neck biopsy      Family History  Problem Relation Age of Onset  . Hypertension Brother   . Gout Brother   . Prostate cancer Brother   . Hypertension Brother   . Prostate cancer Brother   . Gout Brother   . Hypertension Sister   . Gout Sister   . Leukemia Sister 70  . Pancreatic cancer Sister 74  . Gout Sister   . Prostate cancer Brother   .  Diabetes Neg Hx     Social History Social History   Tobacco Use  . Smoking status: Former Smoker    Packs/day: 0.25    Years: 12.00    Pack years: 3.00    Quit date: 07/16/1978    Years since quitting: 40.9  . Smokeless tobacco: Never Used  Substance Use Topics  . Alcohol use: No  . Drug use: No    Allergies  Allergen Reactions  . Benazepril Swelling  . Metronidazole Hives  . Mobic [Meloxicam] Hives  . Penicillins Hives and Swelling    Has patient had a PCN reaction causing immediate rash, facial/tongue/throat swelling, SOB or lightheadedness with hypotension: No Has patient had a PCN reaction causing severe rash involving mucus membranes or skin necrosis: Yes Has  patient had a PCN reaction that required hospitalization: No Has patient had a PCN reaction occurring within the last 10 years: No If all of the above answers are "NO", then may proceed with Cephalosporin use.   . Sulfonamide Derivatives Hives    Current Outpatient Medications  Medication Sig Dispense Refill  . allopurinol (ZYLOPRIM) 300 MG tablet Take 1 tablet by mouth once daily 90 tablet 5  . aspirin 325 MG tablet Take 1 tablet (325 mg total) by mouth daily.    Marland Kitchen azaTHIOprine (IMURAN) 50 MG tablet Take by mouth.    . calcitRIOL (ROCALTROL) 0.25 MCG capsule Take 0.25 mcg by mouth 3 (three) times a week.    . cyclobenzaprine (FLEXERIL) 10 MG tablet Take 10 mg by mouth 3 (three) times daily as needed for muscle spasms.     Marland Kitchen DILT-XR 180 MG 24 hr capsule Take 1 capsule by mouth once daily 90 capsule 0  . docusate sodium (COLACE) 100 MG capsule Take 100 mg by mouth 2 (two) times daily. Constipation    . ezetimibe (ZETIA) 10 MG tablet Take 1 tablet by mouth once daily 90 tablet 0  . gabapentin (NEURONTIN) 600 MG tablet Take 600 mg by mouth 3 (three) times daily.    Marland Kitchen glucose blood (ONETOUCH ULTRA) test strip USE 1 STRIP TO CHECK GLUCOSE TWICE DAILY. DX:E11.4 100 each 2  . hydrALAZINE (APRESOLINE) 25 MG tablet Take one tablet by mouth two times daily, at 9am and 9 pm 60 tablet 5  . HYDROcodone-acetaminophen (NORCO/VICODIN) 5-325 MG tablet One tablet every six hours for pain.  Limit 7 days. 28 tablet 0  . metolazone (ZAROXOLYN) 5 MG tablet Take 1 tablet by mouth once daily 90 tablet 0  . metoprolol tartrate (LOPRESSOR) 100 MG tablet Take 1 tablet by mouth twice daily 180 tablet 0  . midodrine (PROAMATINE) 5 MG tablet Take 5 mg by mouth 3 (three) times daily.    . naproxen (NAPROSYN) 500 MG tablet Take 1 tablet (500 mg total) by mouth 2 (two) times daily with a meal. 60 tablet 5  . NOVOLIN 70/30 RELION (70-30) 100 UNIT/ML injection INJECT 190 UNITS SUBCUTANEOUSLY ONCE DAILY WITH BREAKFAST 70 mL 0   . NOVOLIN R RELION 100 UNIT/ML injection INJECT 80 UNITS SUBCUTANEOUSLY ONCE DAILY WITH SUPPER 30 mL 0  . ONETOUCH DELICA LANCETS 09X MISC 1 Device by Does not apply route 2 (two) times daily. 60 each 11  . potassium chloride (KLOR-CON) 10 MEQ tablet TAKE 3 TABLETS BY MOUTH THREE TIMES DAILY 270 tablet 0  . pravastatin (PRAVACHOL) 20 MG tablet TAKE 1 TABLET BY MOUTH ONCE DAILY BEFORE BREAKFAST 90 tablet 0  . prednisoLONE acetate (PRED FORTE) 1 % ophthalmic suspension  Place 1 drop into the left eye 4 (four) times daily.    Marland Kitchen RELION INSULIN SYR 1CC/30G 30G X 5/16" 1 ML MISC USE ONE SYRINGE TO INJECT INSULIN SUBCUTANEOUSLY THREE TIMES DAILY AS DIRECTED 150 each 0  . RELION INSULIN SYRINGE 31G X 15/64" 1 ML MISC USE 1 SYRINGE THREE TIMES DAILY AS DIRECTED 150 each 0  . spironolactone (ALDACTONE) 25 MG tablet TAKE 1 TABLET BY MOUTH ONCE DAILY 90 tablet 1  . tobramycin-dexamethasone (TOBRADEX) ophthalmic solution Place 2 drops into the left eye every 4 (four) hours.    . torsemide (DEMADEX) 20 MG tablet Take 60 mg by mouth 2 (two) times daily.      No current facility-administered medications for this visit.     Physical Exam  Blood pressure (!) 142/56, pulse 78, height 5\' 3"  (1.6 m), weight 193 lb (87.5 kg).  Constitutional: overall normal hygiene, normal nutrition, well developed, normal grooming, normal body habitus. Assistive device:none  Musculoskeletal: gait and station Limp none, muscle tone and strength are normal, no tremors or atrophy is present.  .  Neurological: coordination overall normal.  Deep tendon reflex/nerve stretch intact.  Sensation normal.  Cranial nerves II-XII intact.   Skin:   Normal overall no scars, lesions, ulcers or rashes. No psoriasis.  Psychiatric: Alert and oriented x 3.  Recent memory intact, remote memory unclear.  Normal mood and affect. Well groomed.  Good eye contact.  Cardiovascular: overall no swelling, no varicosities, no edema bilaterally, normal  temperatures of the legs and arms, no clubbing, cyanosis and good capillary refill.  Lymphatic: palpation is normal. Spine/Pelvis examination:  Inspection:  Overall, sacoiliac joint benign and hips nontender; without crepitus or defects.   Thoracic spine inspection: Alignment normal without kyphosis present   Lumbar spine inspection:  Alignment  with normal lumbar lordosis, without scoliosis apparent.   Thoracic spine palpation:  without tenderness of spinal processes   Lumbar spine palpation: without tenderness of lumbar area; without tightness of lumbar muscles    Range of Motion:   Lumbar flexion, forward flexion is normal without pain or tenderness    Lumbar extension is full without pain or tenderness   Left lateral bend is normal without pain or tenderness   Right lateral bend is normal without pain or tenderness   Straight leg raising is normal  Strength & tone: normal   Stability overall normal stability  All other systems reviewed and are negative   The patient has been educated about the nature of the problem(s) and counseled on treatment options.  The patient appeared to understand what I have discussed and is in agreement with it. Encounter Diagnoses  Name Primary?  . Chronic left-sided low back pain with left-sided sciatica Yes  . 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  Precautions discussed.  Continue current medications.   Return to clinic to neurosurgeon   Electronically Signed Sanjuana Kava, MD 6/3/20219:43 AM

## 2019-07-07 ENCOUNTER — Telehealth: Payer: Self-pay | Admitting: Orthopaedic Surgery

## 2019-07-07 NOTE — Telephone Encounter (Signed)
Patient requests refill on Hydrocodone/Acetaminophen 5-325  Mgs.  Qty  28  Sig: One tablet every six hours for pain. Limit 7 days.  Patient states she uses Product/process development scientist

## 2019-07-08 ENCOUNTER — Other Ambulatory Visit: Payer: Self-pay | Admitting: Endocrinology

## 2019-07-08 MED ORDER — HYDROCODONE-ACETAMINOPHEN 5-325 MG PO TABS: ORAL_TABLET | ORAL | 0 refills | Status: DC

## 2019-07-12 ENCOUNTER — Other Ambulatory Visit: Payer: Self-pay | Admitting: Family Medicine

## 2019-07-15 ENCOUNTER — Telehealth: Payer: Self-pay | Admitting: *Deleted

## 2019-07-15 ENCOUNTER — Telehealth: Payer: Self-pay

## 2019-07-15 DIAGNOSIS — M48062 Spinal stenosis, lumbar region with neurogenic claudication: Secondary | ICD-10-CM | POA: Diagnosis not present

## 2019-07-15 DIAGNOSIS — M4310 Spondylolisthesis, site unspecified: Secondary | ICD-10-CM | POA: Diagnosis not present

## 2019-07-15 NOTE — Telephone Encounter (Signed)
° °  [facility] Pre-operative Risk Assessment    HEARTCARE STAFF: - Please ensure there is not already an duplicate clearance open for this procedure. - Under Visit Info/Reason for Call, type in Other and utilize the format Clearance MM/DD/YY or Clearance TBD. Do not use dashes or single digits. - If request is for dental extraction, please clarify the # of teeth to be extracted.  Request for surgical clearance:  1. What type of surgery is being performed? L4-5 Lumbar Fusion  2. When is this surgery scheduled? TBD   3. What type of clearance is required (medical clearance vs. Pharmacy clearance to hold med vs. Both)? Both  4. Are there any medications that need to be held prior to surgery and how long? aspirin  5. Practice name and name of physician performing surgery? [facility] Dr. Mallie Mussel A. Pool  6. What is the office phone number? [phone]   7.   What is the office fax number? [phone]  8.   Anesthesia type (None, local, MAC, general) ? general   Marlou Sa 07/15/2019, 3:56 PM  _________________________________________________________________   (provider comments below)

## 2019-07-15 NOTE — Telephone Encounter (Signed)
Attempted to reach pt to schedule appointment for preop clearance. No answer. Lvm

## 2019-07-15 NOTE — Telephone Encounter (Signed)
Received notification from [facility] requesting Surgical Clearance. This has been placed on Dr. Cordelia Pen desk for him to review and address upon his return to the office 07/20/19. Uncertain if Dr. Loanne Drilling will require pt to have an appt or if he has enough information to complete clearance form WITHOUT an appt. Will await his response.

## 2019-07-15 NOTE — Telephone Encounter (Signed)
Primary Cardiologist:Samuel Domenic Polite, MD  Chart reviewed as part of pre-operative protocol coverage. Because of Krista Strickland's past medical history and time since last visit, he/she will require a follow-up visit in order to better assess preoperative cardiovascular risk.  Pre-op covering staff: - Please schedule appointment and call patient to inform them. - Please contact requesting surgeon's office via preferred method (i.e, phone, fax) to inform them of need for appointment prior to surgery.  If applicable, this message will also be routed to pharmacy pool and/or primary cardiologist for input on holding anticoagulant/antiplatelet agent as requested below so that this information is available at time of patient's appointment.   Deberah Pelton, NP  07/15/2019, 4:17 PM

## 2019-07-16 ENCOUNTER — Other Ambulatory Visit: Payer: Self-pay | Admitting: Family Medicine

## 2019-07-16 ENCOUNTER — Telehealth: Payer: Self-pay | Admitting: Cardiology

## 2019-07-16 NOTE — Telephone Encounter (Signed)
Pt c/o Shortness Of Breath: STAT if SOB developed within the last 24 hours or pt is noticeably SOB on the phone  1. Are you currently SOB (can you hear that pt is SOB on the phone)? yes  2. How long have you been experiencing SOB? A few days  3. Are you SOB when sitting or when up moving around? When she tries to walk and move around   4. Are you currently experiencing any other symptoms? No  Pt was told by her surgeon Dr. Trenton Gammon to see her Cardiologist because of her SOB. She is scheduled to see Dr. Domenic Polite 07/24/19 but she does not want to wait that long.   Attempted to contact the [place] office but did not get an answer

## 2019-07-16 NOTE — Telephone Encounter (Signed)
Pt has been scheduled to see Dr. Domenic Polite 07/24/19 for pre op clearance. I will forward clearance notes to MD for upcoming appt. I will send FYI to requesting surgeon's office pt has appt with cardiologist for pre op assessment. I will remove from the pre op call back pool.

## 2019-07-16 NOTE — Telephone Encounter (Signed)
Left message for the pt to call back to schedule a pre op clearance appt with Dr. Domenic Polite in the [place] office.

## 2019-07-20 ENCOUNTER — Telehealth: Payer: Self-pay

## 2019-07-20 NOTE — Telephone Encounter (Signed)
We have received Surgery Clearance for the patient  From [facility]  Left message on 07-20-19 for the pt to call back to schedule an appointment

## 2019-07-21 ENCOUNTER — Ambulatory Visit: Payer: PPO | Admitting: Endocrinology

## 2019-07-21 NOTE — Telephone Encounter (Signed)
Krista Strickland for patient that Dr.McDowell has 2 openings tomorrow at the [facility] office and to please call back to confirm

## 2019-07-21 NOTE — Telephone Encounter (Signed)
I spoke with patient, she would like the 920 am apt tomorrow in [place] with Dr.McDowell.

## 2019-07-22 ENCOUNTER — Ambulatory Visit: Payer: PPO | Admitting: Family Medicine

## 2019-07-22 ENCOUNTER — Observation Stay (HOSPITAL_COMMUNITY)
Admission: EM | Admit: 2019-07-22 | Discharge: 2019-07-23 | Disposition: A | Discharge status: Home / Self Care | Payer: PPO | Attending: Family Medicine | Admitting: Family Medicine

## 2019-07-22 ENCOUNTER — Observation Stay (HOSPITAL_COMMUNITY): Payer: PPO

## 2019-07-22 ENCOUNTER — Emergency Department (HOSPITAL_COMMUNITY): Payer: PPO

## 2019-07-22 ENCOUNTER — Encounter: Payer: Self-pay | Admitting: Cardiology

## 2019-07-22 ENCOUNTER — Ambulatory Visit (INDEPENDENT_AMBULATORY_CARE_PROVIDER_SITE_OTHER): Payer: PPO | Admitting: Cardiology

## 2019-07-22 ENCOUNTER — Other Ambulatory Visit: Payer: Self-pay

## 2019-07-22 VITALS — BP 140/62 | HR 77 | Ht 63.0 in | Wt 192.2 lb

## 2019-07-22 DIAGNOSIS — N184 Chronic kidney disease, stage 4 (severe): Secondary | ICD-10-CM | POA: Diagnosis not present

## 2019-07-22 DIAGNOSIS — I10 Essential (primary) hypertension: Secondary | ICD-10-CM | POA: Diagnosis not present

## 2019-07-22 DIAGNOSIS — I422 Other hypertrophic cardiomyopathy: Secondary | ICD-10-CM

## 2019-07-22 DIAGNOSIS — D649 Anemia, unspecified: Secondary | ICD-10-CM | POA: Diagnosis not present

## 2019-07-22 DIAGNOSIS — Z8542 Personal history of malignant neoplasm of other parts of uterus: Secondary | ICD-10-CM | POA: Diagnosis not present

## 2019-07-22 DIAGNOSIS — I129 Hypertensive chronic kidney disease with stage 1 through stage 4 chronic kidney disease, or unspecified chronic kidney disease: Secondary | ICD-10-CM | POA: Diagnosis not present

## 2019-07-22 DIAGNOSIS — Z87891 Personal history of nicotine dependence: Secondary | ICD-10-CM | POA: Insufficient documentation

## 2019-07-22 DIAGNOSIS — Z794 Long term (current) use of insulin: Secondary | ICD-10-CM | POA: Diagnosis not present

## 2019-07-22 DIAGNOSIS — R0600 Dyspnea, unspecified: Secondary | ICD-10-CM | POA: Diagnosis not present

## 2019-07-22 DIAGNOSIS — N183 Chronic kidney disease, stage 3 unspecified: Secondary | ICD-10-CM | POA: Diagnosis not present

## 2019-07-22 DIAGNOSIS — K5730 Diverticulosis of large intestine without perforation or abscess without bleeding: Secondary | ICD-10-CM | POA: Diagnosis not present

## 2019-07-22 DIAGNOSIS — R2243 Localized swelling, mass and lump, lower limb, bilateral: Secondary | ICD-10-CM | POA: Diagnosis not present

## 2019-07-22 DIAGNOSIS — Z7982 Long term (current) use of aspirin: Secondary | ICD-10-CM | POA: Insufficient documentation

## 2019-07-22 DIAGNOSIS — R001 Bradycardia, unspecified: Secondary | ICD-10-CM | POA: Diagnosis not present

## 2019-07-22 DIAGNOSIS — R5383 Other fatigue: Secondary | ICD-10-CM | POA: Diagnosis not present

## 2019-07-22 DIAGNOSIS — E1140 Type 2 diabetes mellitus with diabetic neuropathy, unspecified: Secondary | ICD-10-CM | POA: Diagnosis present

## 2019-07-22 DIAGNOSIS — D631 Anemia in chronic kidney disease: Secondary | ICD-10-CM | POA: Diagnosis present

## 2019-07-22 DIAGNOSIS — E781 Pure hyperglyceridemia: Secondary | ICD-10-CM | POA: Diagnosis present

## 2019-07-22 DIAGNOSIS — R0602 Shortness of breath: Secondary | ICD-10-CM | POA: Diagnosis not present

## 2019-07-22 DIAGNOSIS — Z20822 Contact with and (suspected) exposure to covid-19: Secondary | ICD-10-CM | POA: Insufficient documentation

## 2019-07-22 DIAGNOSIS — N189 Chronic kidney disease, unspecified: Secondary | ICD-10-CM | POA: Diagnosis present

## 2019-07-22 DIAGNOSIS — J90 Pleural effusion, not elsewhere classified: Secondary | ICD-10-CM | POA: Diagnosis not present

## 2019-07-22 DIAGNOSIS — R0609 Other forms of dyspnea: Secondary | ICD-10-CM

## 2019-07-22 DIAGNOSIS — I639 Cerebral infarction, unspecified: Secondary | ICD-10-CM | POA: Diagnosis present

## 2019-07-22 LAB — COMPREHENSIVE METABOLIC PANEL
ALT: 20 U/L (ref 0–44)
AST: 19 U/L (ref 15–41)
Albumin: 4.2 g/dL (ref 3.5–5.0)
Alkaline Phosphatase: 87 U/L (ref 38–126)
Anion gap: 15 (ref 5–15)
BUN: 154 mg/dL — ABNORMAL HIGH (ref 8–23)
CO2: 24 mmol/L (ref 22–32)
Calcium: 10.1 mg/dL (ref 8.9–10.3)
Chloride: 92 mmol/L — ABNORMAL LOW (ref 98–111)
Creatinine, Ser: 3.05 mg/dL — ABNORMAL HIGH (ref 0.44–1.00)
GFR calc Af Amer: 17 mL/min — ABNORMAL LOW (ref >60)
GFR calc non Af Amer: 15 mL/min — ABNORMAL LOW (ref >60)
Glucose, Bld: 107 mg/dL — ABNORMAL HIGH (ref 70–99)
Potassium: 3.7 mmol/L (ref 3.5–5.1)
Sodium: 131 mmol/L — ABNORMAL LOW (ref 135–145)
Total Bilirubin: 0.5 mg/dL (ref 0.3–1.2)
Total Protein: 7.4 g/dL (ref 6.5–8.1)

## 2019-07-22 LAB — CBC
HCT: 18.2 % — ABNORMAL LOW (ref 36.0–46.0)
Hemoglobin: 6.4 g/dL — CL (ref 12.0–15.0)
MCH: 34.6 pg — ABNORMAL HIGH (ref 26.0–34.0)
MCHC: 35.2 g/dL (ref 30.0–36.0)
MCV: 98.4 fL (ref 80.0–100.0)
Platelets: 253 10*3/uL (ref 150–400)
RBC: 1.85 MIL/uL — ABNORMAL LOW (ref 3.87–5.11)
RDW: 14.3 % (ref 11.5–15.5)
WBC: 4.2 10*3/uL (ref 4.0–10.5)
nRBC: 0 % (ref 0.0–0.2)

## 2019-07-22 LAB — POC OCCULT BLOOD, ED: Fecal Occult Bld: NEGATIVE

## 2019-07-22 LAB — IRON AND TIBC
Iron: 336 ug/dL — ABNORMAL HIGH (ref 28–170)
Saturation Ratios: 89 % — ABNORMAL HIGH (ref 10.4–31.8)
TIBC: 379 ug/dL (ref 250–450)
UIBC: 43 ug/dL

## 2019-07-22 LAB — FOLATE: Folate: 10.9 ng/mL (ref >5.9)

## 2019-07-22 LAB — GLUCOSE, CAPILLARY: Glucose-Capillary: 185 mg/dL — ABNORMAL HIGH (ref 70–99)

## 2019-07-22 LAB — TROPONIN I (HIGH SENSITIVITY)
Troponin I (High Sensitivity): 72 ng/L — ABNORMAL HIGH (ref <18)
Troponin I (High Sensitivity): 71 ng/L — ABNORMAL HIGH (ref <18)

## 2019-07-22 LAB — RETICULOCYTES
Immature Retic Fract: 0 % — ABNORMAL LOW (ref 2.3–15.9)
RBC.: 1.87 MIL/uL — ABNORMAL LOW (ref 3.87–5.11)
Retic Count, Absolute: 7.7 10*3/uL — ABNORMAL LOW (ref 19.0–186.0)
Retic Ct Pct: 0.4 % (ref 0.4–3.1)

## 2019-07-22 LAB — FERRITIN: Ferritin: 406 ng/mL — ABNORMAL HIGH (ref 11–307)

## 2019-07-22 LAB — VITAMIN B12: Vitamin B-12: 486 pg/mL (ref 180–914)

## 2019-07-22 LAB — PREPARE RBC (CROSSMATCH)

## 2019-07-22 LAB — BRAIN NATRIURETIC PEPTIDE: B Natriuretic Peptide: 423 pg/mL — ABNORMAL HIGH (ref 0.0–100.0)

## 2019-07-22 LAB — TSH: TSH: 1.411 u[IU]/mL (ref 0.350–4.500)

## 2019-07-22 MED ORDER — ASPIRIN 325 MG PO TABS: 325.0000 mg | ORAL_TABLET | Freq: Every day | ORAL | Status: DC

## 2019-07-22 MED ORDER — INSULIN ASPART 100 UNIT/ML ~~LOC~~ SOLN
0.0000 [IU] | Freq: Three times a day (TID) | SUBCUTANEOUS | Status: DC
  Administered 2019-07-23: 5 [IU] via SUBCUTANEOUS

## 2019-07-22 MED ORDER — PRAVASTATIN SODIUM 10 MG PO TABS
20.0000 mg | ORAL_TABLET | Freq: Every morning | ORAL | Status: DC
  Administered 2019-07-23: 20 mg via ORAL
  Filled 2019-07-22: qty 2

## 2019-07-22 MED ORDER — FUROSEMIDE 10 MG/ML IJ SOLN
40.0000 mg | Freq: Once | INTRAMUSCULAR | Status: AC
  Administered 2019-07-22: 40 mg via INTRAVENOUS
  Filled 2019-07-22: qty 4

## 2019-07-22 MED ORDER — METOLAZONE 5 MG PO TABS
5.0000 mg | ORAL_TABLET | Freq: Every day | ORAL | Status: DC
  Administered 2019-07-23: 5 mg via ORAL
  Filled 2019-07-22: qty 1

## 2019-07-22 MED ORDER — MIDODRINE HCL 5 MG PO TABS
5.0000 mg | ORAL_TABLET | Freq: Three times a day (TID) | ORAL | Status: DC
  Administered 2019-07-22 – 2019-07-23 (×2): 5 mg via ORAL
  Filled 2019-07-22 (×2): qty 1

## 2019-07-22 MED ORDER — METOPROLOL TARTRATE 50 MG PO TABS
50.0000 mg | ORAL_TABLET | Freq: Two times a day (BID) | ORAL | Status: DC
  Administered 2019-07-22 – 2019-07-23 (×2): 50 mg via ORAL
  Filled 2019-07-22 (×2): qty 1

## 2019-07-22 MED ORDER — ALLOPURINOL 300 MG PO TABS
300.0000 mg | ORAL_TABLET | Freq: Every day | ORAL | Status: DC
  Administered 2019-07-23: 300 mg via ORAL
  Filled 2019-07-22: qty 1

## 2019-07-22 MED ORDER — EZETIMIBE 10 MG PO TABS
10.0000 mg | ORAL_TABLET | Freq: Every day | ORAL | Status: DC
  Administered 2019-07-23: 10 mg via ORAL
  Filled 2019-07-22: qty 1

## 2019-07-22 MED ORDER — HEPARIN SODIUM (PORCINE) 5000 UNIT/ML IJ SOLN: 5000.0000 [IU] | Freq: Three times a day (TID) | INTRAMUSCULAR | Status: DC

## 2019-07-22 MED ORDER — INSULIN ASPART 100 UNIT/ML ~~LOC~~ SOLN: 0.0000 [IU] | Freq: Every day | SUBCUTANEOUS | Status: DC

## 2019-07-22 MED ORDER — INSULIN ASPART PROT & ASPART (70-30 MIX) 100 UNIT/ML ~~LOC~~ SUSP: 25.0000 [IU] | Freq: Two times a day (BID) | SUBCUTANEOUS | Status: DC

## 2019-07-22 MED ORDER — SODIUM CHLORIDE 0.9 % IV SOLN: 10.0000 mL/h | Freq: Once | INTRAVENOUS | Status: DC

## 2019-07-22 MED ORDER — TORSEMIDE 20 MG PO TABS
60.0000 mg | ORAL_TABLET | Freq: Two times a day (BID) | ORAL | Status: DC
  Administered 2019-07-22 – 2019-07-23 (×2): 60 mg via ORAL
  Filled 2019-07-22 (×2): qty 3

## 2019-07-22 MED ORDER — METOPROLOL TARTRATE 50 MG PO TABS: 100.0000 mg | ORAL_TABLET | Freq: Two times a day (BID) | ORAL | Status: DC

## 2019-07-22 NOTE — ED Triage Notes (Signed)
Pt was seen at Dr. Myles Gip office today. She has been getting SOB with exertion. She was ambulated in the office and withheld O2 sats above 90%. He would like for her to have lab work drawn.   Per Dr. Domenic Polite "She will need a full set of lab work to reevaluate renal function and electrolytes, exclude anemia and infection, check TSH, also chest x-ray, cardiac enzymes"

## 2019-07-22 NOTE — Patient Instructions (Addendum)
Medication Instructions:   Your physician recommends that you continue on your current medications as directed. Please refer to the Current Medication list given to you today.  Labwork:  NONE  Testing/Procedures:  NONE  Follow-Up:  Your physician recommends that you schedule a follow-up appointment in: pending ED visit.  Please go to [facility] Emergency Room now for an evaluation.   Any Other Special Instructions Will Be Listed Below (If Applicable).  If you need a refill on your cardiac medications before your next appointment, please call your pharmacy.

## 2019-07-22 NOTE — ED Provider Notes (Addendum)
[facility] EMERGENCY DEPARTMENT Provider Note   CSN: [id number] Arrival date & time: 07/22/19  1204     History Chief Complaint  Patient presents with   Lab Evaluation    Krista Strickland is a 69 y.o. female with a history significant for diabetes, hypertension, GERD, chronic kidney disease stage III presenting from her cardiologist office for further evaluation for worsening shortness of breath which she states started within the past 1-2 weeks.  She also endorses generalized fatigue, denies chest pain, cough, fever, recent flu like symptoms, known covid exposures,  n/v, palpitations or diaphoresis.  She has bilateral lower extremity edema but reports this is a chronic finding.  Her sob is worse with exertion and she denies orthopnea.  No significant coughing, wheezing or other sx as long as she is resting.   HPI     Past Medical History:  Diagnosis Date   Arthritis    CKD (chronic kidney disease) stage 3, GFR 30-59 ml/min    Diabetes mellitus, type 2 ([place])    Essential hypertension    GERD (gastroesophageal reflux disease)    Gout    History of MRSA infection 03/2009   History of stroke 2013   Hypercalcemia 2017   Managed by nephrology   Hyperlipidemia    Obesity    Psoriasis    Uterine cancer ([place])    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    Patient Active Problem List   Diagnosis Date Noted   Symptomatic anemia 23/53/6144   Diastolic dysfunction 31/54/0086   Anemia in chronic kidney disease 12/18/2018   Hypercalcemia 12/18/2018   Hypokalemia 12/18/2018   Proteinuria 12/18/2018   Secondary hyperparathyroidism ([place]) 12/18/2018   Vitamin D deficiency 12/18/2018   Chronic kidney disease, stage IV (severe) ([place]) 12/18/2018   Hypertensive retinopathy of both eyes 11/14/2018   Nuclear sclerotic cataract of both eyes 11/14/2018   Iritis of left eye 11/14/2018   Type 2 diabetes mellitus with diabetic neuropathy, unspecified ([place]) 09/08/2017     Diabetic neuropathy ([place]) 11/09/2015   Multinodular goiter (nontoxic) 09/20/2013   Hypertriglyceridemia 08/05/2013   Back pain 04/16/2013   Type 2 diabetes mellitus with hyperglycemia ([place]) 12/07/2012   CVA (cerebral vascular accident) ([place]) 07/23/2011   CKD (chronic kidney disease) stage 3, GFR 30-59 ml/min 11/07/2009   SLEEP APNEA 09/06/2009   Hyperuricemia 11/04/2008   Hyperlipidemia LDL goal <100 07/02/2007   Obesity (BMI 30.0-34.9) 07/02/2007   Essential hypertension 07/02/2007    Past Surgical History:  Procedure Laterality Date   ABDOMINAL HYSTERECTOMY     APPENDECTOMY  2012   COLONOSCOPY WITH PROPOFOL N/A 08/24/2016   Procedure: COLONOSCOPY WITH PROPOFOL;  Surgeon: Rogene Houston, MD;  Location: [facility] END[facility] SU[facility];  Service: Endoscopy;  Laterality: N/A;  10:30   Excison of right breast cyst     LAPAROSCOPIC SALPINGO OOPHERECTOMY Right 04/22/2012   Procedure: LAPAROSCOPIC SALPINGO OOPHORECTOMY;  Surgeon: Jonnie Kind, MD;  Location: [facility] ORS;  Service: Gynecology;  Laterality: Right;  end 11:17   MASS EXCISION N/A 04/22/2012   Procedure: EXCISION SKIN TAGS NECK AND HEAD;  Surgeon: Jonnie Kind, MD;  Location: [facility] ORS;  Service: Gynecology;  Laterality: N/A;  start 11:19   PARTIAL HYSTERECTOMY     POLYPECTOMY  08/24/2016   Procedure: POLYPECTOMY;  Surgeon: Rogene Houston, MD;  Location: [facility];  Service: Endoscopy;;  colon   Right neck biopsy       OB History    Gravida  1  Para  1   Term      Preterm      AB      Living        SAB      TAB      Ectopic      Multiple      Live Births              Family History  Problem Relation Age of Onset   Hypertension Brother    Gout Brother    Prostate cancer Brother    Hypertension Brother    Prostate cancer Brother    Gout Brother    Hypertension Sister    Gout Sister    Leukemia Sister 85   Pancreatic cancer Sister 29   Gout Sister    Prostate cancer  Brother    Diabetes Neg Hx     Social History   Tobacco Use   Smoking status: Former Smoker    Packs/day: 0.25    Years: 12.00    Pack years: 3.00    Quit date: 07/16/1978    Years since quitting: 41.0   Smokeless tobacco: Never Used  Vaping Use   Vaping Use: Never used  Substance Use Topics   Alcohol use: No   Drug use: No    Home Medications Prior to Admission medications   Medication Sig Start Date End Date Taking? Authorizing Provider  allopurinol (ZYLOPRIM) 300 MG tablet Take 1 tablet by mouth once daily 09/02/18   Sanjuana Kava, MD  aspirin 325 MG tablet Take 1 tablet (325 mg total) by mouth daily. 08/25/16   Rogene Houston, MD  azaTHIOprine (IMURAN) 50 MG tablet Take by mouth. 05/18/19   [provider]  calcitRIOL (ROCALTROL) 0.25 MCG capsule Take 0.25 mcg by mouth 3 (three) times a week. 03/28/19   [provider]  cyclobenzaprine (FLEXERIL) 10 MG tablet Take 10 mg by mouth 3 (three) times daily as needed for muscle spasms.     [provider]  DILT-XR 180 MG 24 hr capsule Take 1 capsule by mouth once daily 03/30/19   Fayrene Helper, MD  docusate sodium (COLACE) 100 MG capsule Take 100 mg by mouth 2 (two) times daily. Constipation    [provider]  ezetimibe (ZETIA) 10 MG tablet Take 1 tablet by mouth once daily 06/01/19   Fayrene Helper, MD  gabapentin (NEURONTIN) 600 MG tablet Take 600 mg by mouth 3 (three) times daily. 06/15/19   [provider]  glucose blood (ONETOUCH ULTRA) test strip USE 1 STRIP TO CHECK GLUCOSE TWICE DAILY. DX:E11.4 01/27/19   Renato Shin, MD  hydrALAZINE (APRESOLINE) 25 MG tablet Take one tablet by mouth two times daily, at 9am and 9 pm 02/11/19   Fayrene Helper, MD  HYDROcodone-acetaminophen (NORCO/VICODIN) 5-325 MG tablet One tablet every six hours for pain.  Limit 7 days. 07/08/19   Sanjuana Kava, MD  insulin regular (NOVOLIN R RELION) 100 units/mL injection Inject 0.6 mLs (60 Units  total) into the skin daily with supper. E11.9 07/08/19   Renato Shin, MD  metolazone (ZAROXOLYN) 5 MG tablet Take 1 tablet by mouth once daily 06/25/19   Fayrene Helper, MD  metoprolol tartrate (LOPRESSOR) 100 MG tablet Take 1 tablet by mouth twice daily 06/01/19   Fayrene Helper, MD  midodrine (PROAMATINE) 5 MG tablet Take 5 mg by mouth 3 (three) times daily. 03/10/19   [provider]  naproxen (NAPROSYN) 500 MG tablet Take  1 tablet (500 mg total) by mouth 2 (two) times daily with a meal. 06/02/19   Sanjuana Kava, MD  NOVOLIN 70/30 RELION (70-30) 100 UNIT/ML injection INJECT 190 UNITS SUBCUTANEOUSLY ONCE DAILY WITH BREAKFAST 05/29/19   Renato Shin, MD  [facility] DELICA LANCETS 14E MISC 1 Device by Does not apply route 2 (two) times daily. 04/06/14   Renato Shin, MD  potassium chloride (KLOR-CON) 10 MEQ tablet TAKE 3 TABLETS BY MOUTH THREE TIMES DAILY 07/13/19   Fayrene Helper, MD  pravastatin (PRAVACHOL) 20 MG tablet TAKE 1 TABLET BY MOUTH ONCE DAILY BEFORE BREAKFAST 06/30/19   Fayrene Helper, MD  prednisoLONE acetate (PRED FORTE) 1 % ophthalmic suspension Place 1 drop into the left eye 4 (four) times daily. 09/04/18   [provider]  Bend 1CC/30G 30G X 5/16" 1 ML MISC USE ONE SYRINGE TO INJECT INSULIN SUBCUTANEOUSLY THREE TIMES DAILY AS DIRECTED 07/26/15   Renato Shin, MD  RELION INSULIN SYRINGE 31G X 15/64" 1 ML MISC USE 1 SYRINGE THREE TIMES DAILY AS DIRECTED 07/29/17   Renato Shin, MD  spironolactone (ALDACTONE) 25 MG tablet TAKE 1 TABLET BY MOUTH ONCE DAILY 05/10/17   Fayrene Helper, MD  tobramycin-dexamethasone [facility]) ophthalmic solution Place 2 drops into the left eye every 4 (four) hours. 09/04/18   [provider]  torsemide (DEMADEX) 20 MG tablet Take 60 mg by mouth 2 (two) times daily.  03/25/15   [provider]    Allergies    Benazepril, Metronidazole, Mobic [meloxicam], Penicillins, and Sulfonamide  derivatives  Review of Systems   Review of Systems  Constitutional: Positive for fatigue. Negative for fever.  HENT: Negative for congestion and sore throat.   Eyes: Negative.   Respiratory: Positive for shortness of breath. Negative for cough and chest tightness.   Cardiovascular: Positive for leg swelling. Negative for chest pain and palpitations.  Gastrointestinal: Negative for abdominal pain, nausea and vomiting.  Genitourinary: Negative.   Musculoskeletal: Negative for arthralgias, joint swelling and neck pain.  Skin: Negative.  Negative for rash and wound.  Neurological: Negative for dizziness, weakness, light-headedness, numbness and headaches.  Psychiatric/Behavioral: Negative.     Physical Exam Updated Vital Signs BP (!) 123/56    Pulse (!) 113    Temp 98.4 F (36.9 C) (Oral)    Resp 18    Ht 5\' 3"  (1.6 m)    Wt 87.1 kg    SpO2 93%    BMI 34.01 kg/m   Physical Exam Vitals and nursing note reviewed.  Constitutional:      Appearance: She is well-developed.  HENT:     Head: Normocephalic and atraumatic.  Eyes:     Conjunctiva/sclera: Conjunctivae normal.  Cardiovascular:     Rate and Rhythm: Regular rhythm. Tachycardia present.     Heart sounds: Normal heart sounds.  Pulmonary:     Effort: Pulmonary effort is normal.     Breath sounds: Normal breath sounds. No wheezing, rhonchi or rales.  Abdominal:     General: Bowel sounds are normal.     Palpations: Abdomen is soft.     Tenderness: There is no abdominal tenderness.  Musculoskeletal:        General: Normal range of motion.     Cervical back: Normal range of motion.     Right lower leg: Edema present.     Left lower leg: Edema present.  Skin:    General: Skin is warm and dry.  Neurological:  Mental Status: She is alert.     ED Results / Procedures / Treatments   Labs (all labs ordered are listed, but only abnormal results are displayed) Labs Reviewed  CBC - Abnormal; Notable for the following  components:      Result Value   RBC 1.85 (*)    Hemoglobin 6.4 (*)    HCT 18.2 (*)    MCH 34.6 (*)    All other components within normal limits  COMPREHENSIVE METABOLIC PANEL - Abnormal; Notable for the following components:   Sodium 131 (*)    Chloride 92 (*)    Glucose, Bld 107 (*)    BUN 154 (*)    Creatinine, Ser 3.05 (*)    GFR calc non Af Amer 15 (*)    GFR calc Af Amer 17 (*)    All other components within normal limits  BRAIN NATRIURETIC PEPTIDE - Abnormal; Notable for the following components:   B Natriuretic Peptide 423.0 (*)    All other components within normal limits  TROPONIN I (HIGH SENSITIVITY) - Abnormal; Notable for the following components:   Troponin I (High Sensitivity) 72 (*)    All other components within normal limits  TROPONIN I (HIGH SENSITIVITY) - Abnormal; Notable for the following components:   Troponin I (High Sensitivity) 71 (*)    All other components within normal limits  SARS CORONAVIRUS 2 (TAT 6-24 HRS)  TSH  VITAMIN B12  FOLATE  IRON AND TIBC  FERRITIN  RETICULOCYTES  POC OCCULT BLOOD, ED  TYPE AND SCREEN  PREPARE RBC (CROSSMATCH)    EKG EKG Interpretation  Date/Time:  Wednesday July 22 2019 12:31:41 EDT Ventricular Rate:  57 PR Interval:  274 QRS Duration: 118 QT Interval:  490 QTC Calculation: 476 R Axis:   41 Text Interpretation: Sinus bradycardia with 1st degree A-V block Cannot rule out Anterior infarct , age undetermined Abnormal ECG Confirmed by Sherwood Gambler [phone]) on 07/22/2019 3:27:37 PM   Radiology DG Chest 2 View  Result Date: 07/22/2019 CLINICAL DATA:  Shortness of breath on exertion. EXAM: CHEST - 2 VIEW COMPARISON:  Single-view of the chest 05/29/2010. FINDINGS: There is mild interstitial edema with trace pleural effusions. Heart size is normal. Atherosclerosis is noted. No pneumothorax. No acute bony abnormality. IMPRESSION: Mild interstitial edema and trace pleural effusions. Aortic Atherosclerosis  (ICD10-I70.0). Electronically Signed   By: Inge Rise M.D.   On: 07/22/2019 13:18    Procedures Procedures (including critical care time)  CRITICAL CARE Performed by: Evalee Jefferson Total critical care time: 35 minutes Critical care time was exclusive of separately billable procedures and treating other patients. Critical care was necessary to treat or prevent imminent or life-threatening deterioration. Critical care was time spent personally by me on the following activities: development of treatment plan with patient and/or surrogate as well as nursing, discussions with consultants, evaluation of patient's response to treatment, examination of patient, obtaining history from patient or surrogate, ordering and performing treatments and interventions, ordering and review of laboratory studies, ordering and review of radiographic studies, pulse oximetry and re-evaluation of patient's condition.   Medications Ordered in ED Medications  0.9 %  sodium chloride infusion (has no administration in time range)    ED Course  I have reviewed the triage vital signs and the nursing notes.  Pertinent labs & imaging results that were available during my care of the patient were reviewed by me and considered in my medical decision making (see chart for details).  MDM Rules/Calculators/A&P                          Pt with symptomatic anemia, profoundly changed from last available lab, 6.4 today, was 13.0  8/19.  She is hemoccult negative.  She will require prbc transfusions and she is agreeable to this.  2 units ordered.  Anemia panel ordered prior to starting transfusion.    Discussed with Dr [facility] who accepts pt for admission.   Final Clinical Impression(s) / ED Diagnoses Final diagnoses:  Symptomatic anemia  Chronic renal insufficiency, stage 3 (moderate)    Rx / DC Orders ED Discharge Orders    None       Landis Martins 07/22/19 1727    Evalee Jefferson, PA-C 07/22/19  Darci Needle, MD 07/22/19 2025

## 2019-07-22 NOTE — Progress Notes (Signed)
Cardiology Office Note  Date: 07/22/2019   ID: Krista Strickland, Fehr 1950-12-17, MRN [id number]  PCP:  Fayrene Helper, MD  Cardiologist:  Rozann Lesches, MD Electrophysiologist:  None   Chief Complaint  Patient presents with  . Cardiac follow-up    History of Present Illness: Krista Strickland is a 69 y.o. female that I met in consultation back in January.  She is referred back to the office by Dr. Trenton Gammon due to shortness of breath, also as part of preoperative cardiac assessment prior to L4-5 lumbar fusion.  She has a history of hypertrophic cardiomyopathy, hypertension, also CKD stage IIIb-IV.  She saw Dr. Theador Hawthorne for nephrology follow-up in May.  She tells me that she ran out of her potassium supplements in the last 1 to 2 weeks, was off of them for about a week, started to have "muscle spasms" and shortness of breath with activity since then.  She describes NYHA class III dyspnea, no chest pain or syncope.  She had to stop walking in from her car to the office.  I went over her medications which are outlined below.  She states that she has been taking them regularly including diuretics and AV nodal blockers.  She states that her husband has been telling her to come off of certain medications, she states that he is acting "crazy."  Weight has been stable.  She continues with stable lower extremity edema/lymphedema.  I personally reviewed her ECG today which shows sinus rhythm with borderline prolonged PR interval, LVH, frequent PVCs.  We had her ambulate in the office for about 2 minutes, she had to stop due to shortness of breath.  Oxygen saturations did not get below 90%.   Past Medical History:  Diagnosis Date  . Arthritis   . CKD (chronic kidney disease) stage 3, GFR 30-59 ml/min   . Diabetes mellitus, type 2 ([place])   . Essential hypertension   . GERD (gastroesophageal reflux disease)   . Gout   . History of MRSA infection 03/2009  . History of stroke 2013  .  Hypercalcemia 2017   Managed by nephrology  . Hyperlipidemia   . Obesity   . Psoriasis   . Uterine cancer ([place])   . 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 ABDOMINAL HYSTERECTOMY    . APPENDECTOMY  2012  . COLONOSCOPY WITH PROPOFOL N/A 08/24/2016   Procedure: COLONOSCOPY WITH PROPOFOL;  Surgeon: Rogene Houston, MD;  Location: [facility];  Service: Endoscopy;  Laterality: N/A;  10:30  . Excison of right breast cyst    . LAPAROSCOPIC SALPINGO OOPHERECTOMY Right 04/22/2012   Procedure: LAPAROSCOPIC SALPINGO OOPHORECTOMY;  Surgeon: Jonnie Kind, MD;  Location: [facility];  Service: Gynecology;  Laterality: Right;  end 11:17  . MASS EXCISION N/A 04/22/2012   Procedure: EXCISION SKIN TAGS NECK AND HEAD;  Surgeon: Jonnie Kind, MD;  Location: [facility];  Service: Gynecology;  Laterality: N/A;  start 11:19  . PARTIAL HYSTERECTOMY    . POLYPECTOMY  08/24/2016   Procedure: POLYPECTOMY;  Surgeon: Rogene Houston, MD;  Location: [facility];  Service: Endoscopy;;  colon  . Right neck biopsy      Current Outpatient Medications  Medication Sig Dispense Refill  . allopurinol (ZYLOPRIM) 300 MG tablet Take 1 tablet by mouth once daily 90 tablet 5  . aspirin 325 MG tablet Take 1 tablet (325 mg total) by mouth  daily.    . azaTHIOprine (IMURAN) 50 MG tablet Take by mouth.    . calcitRIOL (ROCALTROL) 0.25 MCG capsule Take 0.25 mcg by mouth 3 (three) times a week.    . cyclobenzaprine (FLEXERIL) 10 MG tablet Take 10 mg by mouth 3 (three) times daily as needed for muscle spasms.     Marland Kitchen DILT-XR 180 MG 24 hr capsule Take 1 capsule by mouth once daily 90 capsule 0  . docusate sodium (COLACE) 100 MG capsule Take 100 mg by mouth 2 (two) times daily. Constipation    . ezetimibe (ZETIA) 10 MG tablet Take 1 tablet by mouth once daily 90 tablet 0  . gabapentin (NEURONTIN) 600 MG tablet Take 600 mg by mouth 3 (three) times daily.    Marland Kitchen glucose blood (ONETOUCH  ULTRA) test strip USE 1 STRIP TO CHECK GLUCOSE TWICE DAILY. DX:E11.4 100 each 2  . hydrALAZINE (APRESOLINE) 25 MG tablet Take one tablet by mouth two times daily, at 9am and 9 pm 60 tablet 5  . HYDROcodone-acetaminophen (NORCO/VICODIN) 5-325 MG tablet One tablet every six hours for pain.  Limit 7 days. 28 tablet 0  . insulin regular (NOVOLIN R RELION) 100 units/mL injection Inject 0.6 mLs (60 Units total) into the skin daily with supper. E11.9 18 mL 0  . metolazone (ZAROXOLYN) 5 MG tablet Take 1 tablet by mouth once daily 90 tablet 0  . metoprolol tartrate (LOPRESSOR) 100 MG tablet Take 1 tablet by mouth twice daily 180 tablet 0  . midodrine (PROAMATINE) 5 MG tablet Take 5 mg by mouth 3 (three) times daily.    . naproxen (NAPROSYN) 500 MG tablet Take 1 tablet (500 mg total) by mouth 2 (two) times daily with a meal. 60 tablet 5  . NOVOLIN 70/30 RELION (70-30) 100 UNIT/ML injection INJECT 190 UNITS SUBCUTANEOUSLY ONCE DAILY WITH BREAKFAST 70 mL 0  . ONETOUCH DELICA LANCETS 30Q MISC 1 Device by Does not apply route 2 (two) times daily. 60 each 11  . potassium chloride (KLOR-CON) 10 MEQ tablet TAKE 3 TABLETS BY MOUTH THREE TIMES DAILY 270 tablet 0  . pravastatin (PRAVACHOL) 20 MG tablet TAKE 1 TABLET BY MOUTH ONCE DAILY BEFORE BREAKFAST 90 tablet 0  . prednisoLONE acetate (PRED FORTE) 1 % ophthalmic suspension Place 1 drop into the left eye 4 (four) times daily.    Marland Kitchen RELION INSULIN SYR 1CC/30G 30G X 5/16" 1 ML MISC USE ONE SYRINGE TO INJECT INSULIN SUBCUTANEOUSLY THREE TIMES DAILY AS DIRECTED 150 each 0  . RELION INSULIN SYRINGE 31G X 15/64" 1 ML MISC USE 1 SYRINGE THREE TIMES DAILY AS DIRECTED 150 each 0  . spironolactone (ALDACTONE) 25 MG tablet TAKE 1 TABLET BY MOUTH ONCE DAILY 90 tablet 1  . tobramycin-dexamethasone (TOBRADEX) ophthalmic solution Place 2 drops into the left eye every 4 (four) hours.    . torsemide (DEMADEX) 20 MG tablet Take 60 mg by mouth 2 (two) times daily.      No current  facility-administered medications for this visit.   Allergies:  Benazepril, Metronidazole, Mobic [meloxicam], Penicillins, and Sulfonamide derivatives   Social History: The patient  reports that she quit smoking about 41 years ago. She has a 3.00 pack-year smoking history. She has never used smokeless tobacco. She reports that she does not drink alcohol and does not use drugs.   Family History: The patient's family history includes Gout in her brother, brother, sister, and sister; Hypertension in her brother, brother, and sister; Leukemia (age of onset: 64) in  her sister; Pancreatic cancer (age of onset: 72) in her sister; Prostate cancer in her brother, brother, and brother.   ROS:  No palpitations or syncope.  Physical Exam: VS:  BP 140/62   Pulse 77   Ht '5\' 3"'  (1.6 m)   Wt 192 lb 3.2 oz (87.2 kg)   SpO2 90% Comment: '@10' :12 am/walking-had to stop d/t SOB  BMI 34.05 kg/m , BMI Body mass index is 34.05 kg/m.  Wt Readings from Last 3 Encounters:  07/22/19 192 lb 3.2 oz (87.2 kg)  07/02/19 193 lb (87.5 kg)  06/02/19 191 lb (86.6 kg)    General: Patient in no obvious distress, but short of breath talking in long sentences and with ambulation. HEENT: Conjunctiva and lids normal, partially wearing a mask. Neck: Supple, no obvious JVD. Lungs: Decreased breath sounds, no wheezing. Cardiac: Regular rate and rhythm with ectopy, no S3, 3/6 systolic murmur. Abdomen: Soft, nontender, bowel sounds present. Extremities: Lower extremity edema/lymphedema. Skin: Warm and dry. Musculoskeletal: No kyphosis. Neuropsychiatric: Alert and oriented x3, affect grossly appropriate.  ECG:  An ECG dated 02/12/2019 was personally reviewed today and demonstrated:  Sinus bradycardia with nonspecific ST changes and poor anterior R wave progression.  Recent Labwork: 12/31/2018: BUN 56; Creatinine, Ser 1.94; Potassium 4.7; Sodium 135; TSH 1.61 02/11/2019: ALT 36; AST 25     Component Value Date/Time   CHOL 155  02/11/2019 1026   CHOL 117 09/07/2017 0930   TRIG 150 (H) 02/11/2019 1026   HDL 32 (L) 02/11/2019 1026   HDL 24 (L) 09/07/2017 0930   CHOLHDL 4.8 02/11/2019 1026   VLDL 55 (H) 06/01/2016 1048   LDLCALC 98 02/11/2019 1026    Other Studies Reviewed Today:  Echocardiogram 01/16/2019: 1. Left ventricular ejection fraction, by visual estimation, is 70 to 75%. The left ventricle has hyperdynamic function. There is severely increased left ventricular hypertrophy (septal greater than posterior) consistent with hypertrophic  cardiomyopathy. Mildly increased LVOT gradient, increases with Valsalva also consistent with HOCM (gradient not measured). 2. Elevated left atrial pressure. 3. Left ventricular diastolic parameters are consistent with Grade I diastolic dysfunction (impaired relaxation). 4. The left ventricle has no regional wall motion abnormalities. 5. Global right ventricle has normal systolic function.The right ventricular size is normal. No increase in right ventricular wall thickness. 6. Left atrial size was moderately dilated. 7. Right atrial size was normal. 8. Presence of pericardial fat pad. 9. Moderate mitral annular calcification. 10. The mitral valve is degenerative. Trivial mitral valve regurgitation. 11. The tricuspid valve is grossly normal. Tricuspid valve regurgitation is trivial. 12. The aortic valve is tricuspid. Aortic valve regurgitation is not visualized. 13. The pulmonic valve was grossly normal. Pulmonic valve regurgitation is trivial. 14. TR signal is inadequate for assessing pulmonary artery systolic pressure. 15. The inferior vena cava is normal in size with greater than 50% respiratory variability, suggesting right atrial pressure of 3 mmHg.  Assessment and Plan:  1.  Progressive dyspnea on exertion and fatigue as described above.  Symptoms noted within the last few weeks, etiology is not clear at this time.  She has a history of hypertrophic  cardiomyopathy, heart rate reasonable today on AV nodal blockers, ECG does not show heart block but frequent PVCs, no syncope.  She has chronic leg swelling with lymphedema, her weight is stable.  I do not see any recent lab work for review.  She states that she ran out of her potassium for about a week but is back on this now.  Reports compliance with her usual medications otherwise.  She was somewhat of a limited historian today, did not seem to be able to focus very well, was not found to be hypoxic with ambulation.  Recommending evaluation in the [facility] to better clarify the situation.  She will need a full set of lab work to reevaluate renal function and electrolytes, exclude anemia and infection, check TSH, also chest x-ray, cardiac enzymes.  2.  CKD stage IIIb-IV, followed by Dr. Theador Hawthorne.  3.  Essential hypertension.  Systolic 680 today.  Medication Adjustments/Labs and Tests Ordered: Current medicines are reviewed at length with the patient today.  Concerns regarding medicines are outlined above.   Tests Ordered: Orders Placed This Encounter  Procedures  . EKG 12-Lead    Medication Changes: No orders of the defined types were placed in this encounter.   Disposition:  Follow up pending for evaluation in the [facility].  Signed, Satira Sark, MD, [facility] 07/22/2019 10:27 AM    [place] at [place], [place], [place] [address] Phone: [phone]; Fax: [phone]

## 2019-07-22 NOTE — H&P (Addendum)
TRH H&P   Patient Demographics:    Krista Strickland, is a 69 y.o. female  MRN: [id number]   DOB - 1950-09-27  Admit Date - 07/22/2019  Outpatient Primary MD for the patient is Fayrene Helper, MD  Referring MD/NP/PA: PA Idol  Outpatient Specialists: renal [facility], cardiology [facility].  Patient coming from: cardiology office.  Chief Complaint  Patient presents with  . Lab Evaluation      HPI:    Krista Strickland  is a 69 y.o. female, with past medical history of diabetes mellitus, hypertension, GERD, chronic kidney disease stage III, chronic lower back pain, followed by neurosurgery, patient was with regular follow-up with cardiology today for preop evaluation for possible need for neurosurgery given her chronic lower back pain, does report significant progressive dyspnea over the last couple weeks, she was noted to be significantly dyspneic at cardiologist office so she was sent to ED for further evaluation, patient reports she has been having progressive dyspnea for 1 to 2 weeks, she does report generalized weakness, fatigue, she denies any chest pain, cough, fever, chills, any Covid exposure, no palpitation or diaphoresis, patient does report chronic lower extremity edema secondary to lymphedema, reports exertional dyspnea, denies any orthopnea. - in ED work-up significant for hemoglobin of 6.4, baseline was around 12 last May, as well her creatinine noted to be elevated at 3, from baseline of 2.4, blood pressure as well remains on the lower side, she is Hemoccult negative, she denies any melena, or coffee-ground emesis, she was ordered 2 units PRBC, and [facility] hospitalist consulted to admit.    Review of systems:    In addition to the HPI above No Fever-chills, she does report fatigue. No Headache, No changes with Vision or hearing, No problems swallowing food or Liquids, No  Chest pain, Cough, she reports progressive dyspnea No Abdominal pain, No Nausea or Vommitting, Bowel movements are regular, No Blood in stool or Urine, No dysuria, No new skin rashes or bruises, No new joints pains-aches,  No new weakness, tingling, numbness in any extremity, No recent weight gain or loss, No polyuria, polydypsia or polyphagia, No significant Mental Stressors.  A full 10 point Review of Systems was done, except as stated above, all other Review of Systems were negative.   With Past History of the following :    Past Medical History:  Diagnosis Date  . Arthritis   . CKD (chronic kidney disease) stage 3, GFR 30-59 ml/min   . Diabetes mellitus, type 2 ([place])   . Essential hypertension   . GERD (gastroesophageal reflux disease)   . Gout   . History of MRSA infection 03/2009  . History of stroke 2013  . Hypercalcemia 2017   Managed by nephrology  . Hyperlipidemia   . Obesity   . Psoriasis   . Uterine cancer ([place])   . 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  Past Surgical History:  Procedure Laterality Date  . ABDOMINAL HYSTERECTOMY    . APPENDECTOMY  2012  . COLONOSCOPY WITH PROPOFOL N/A 08/24/2016   Procedure: COLONOSCOPY WITH PROPOFOL;  Surgeon: Rogene Houston, MD;  Location: [facility];  Service: Endoscopy;  Laterality: N/A;  10:30  . Excison of right breast cyst    . LAPAROSCOPIC SALPINGO OOPHERECTOMY Right 04/22/2012   Procedure: LAPAROSCOPIC SALPINGO OOPHORECTOMY;  Surgeon: Jonnie Kind, MD;  Location: [facility];  Service: Gynecology;  Laterality: Right;  end 11:17  . MASS EXCISION N/A 04/22/2012   Procedure: EXCISION SKIN TAGS NECK AND HEAD;  Surgeon: Jonnie Kind, MD;  Location: [facility];  Service: Gynecology;  Laterality: N/A;  start 11:19  . PARTIAL HYSTERECTOMY    . POLYPECTOMY  08/24/2016   Procedure: POLYPECTOMY;  Surgeon: Rogene Houston, MD;  Location: [facility];  Service: Endoscopy;;  colon  . Right neck biopsy        Social  History:     Social History   Tobacco Use  . Smoking status: Former Smoker    Packs/day: 0.25    Years: 12.00    Pack years: 3.00    Quit date: 07/16/1978    Years since quitting: 41.0  . Smokeless tobacco: Never Used  Substance Use Topics  . Alcohol use: No        Family History :     Family History  Problem Relation Age of Onset  . Hypertension Brother   . Gout Brother   . Prostate cancer Brother   . Hypertension Brother   . Prostate cancer Brother   . Gout Brother   . Hypertension Sister   . Gout Sister   . Leukemia Sister 69  . Pancreatic cancer Sister 76  . Gout Sister   . Prostate cancer Brother   . Diabetes Neg Hx       Home Medications:   Prior to Admission medications   Medication Sig Start Date End Date Taking? Authorizing Provider  allopurinol (ZYLOPRIM) 300 MG tablet Take 1 tablet by mouth once daily Patient taking differently: Take 300 mg by mouth every morning.  09/02/18  Yes Sanjuana Kava, MD  aspirin 325 MG tablet Take 325 mg by mouth every morning.  08/25/16  Yes Rehman, Mechele Dawley, MD  DILT-XR 180 MG 24 hr capsule Take 1 capsule by mouth once daily Patient taking differently: Take 180 mg by mouth every morning.  03/30/19  Yes Fayrene Helper, MD  docusate sodium (COLACE) 100 MG capsule Take 100 mg by mouth daily as needed for mild constipation or moderate constipation.    Yes [provider]  ezetimibe (ZETIA) 10 MG tablet Take 1 tablet by mouth once daily Patient taking differently: Take 10 mg by mouth every morning.  06/01/19  Yes Fayrene Helper, MD  gabapentin (NEURONTIN) 600 MG tablet Take 600 mg by mouth 3 (three) times daily. 06/15/19  Yes [provider]  hydrALAZINE (APRESOLINE) 25 MG tablet Take one tablet by mouth two times daily, at 9am and 9 pm Patient taking differently: Take 25 mg by mouth in the morning and at bedtime.  02/11/19  Yes Fayrene Helper, MD  HYDROcodone-acetaminophen (NORCO/VICODIN) 5-325 MG  tablet One tablet every six hours for pain.  Limit 7 days. Patient taking differently: Take 1 tablet by mouth every 6 (six) hours as needed for moderate pain.  07/08/19  Yes Sanjuana Kava, MD  insulin regular (NOVOLIN R RELION) 100 units/mL  injection Inject 0.6 mLs (60 Units total) into the skin daily with supper. E11.9 Patient taking differently: Inject 70 Units into the skin daily with supper. E11.9 07/08/19  Yes Renato Shin, MD  metolazone (ZAROXOLYN) 5 MG tablet Take 1 tablet by mouth once daily Patient taking differently: Take 5 mg by mouth daily.  06/25/19  Yes Fayrene Helper, MD  metoprolol tartrate (LOPRESSOR) 100 MG tablet Take 1 tablet by mouth twice daily Patient taking differently: Take 100 mg by mouth 2 (two) times daily.  06/01/19  Yes Fayrene Helper, MD  midodrine (PROAMATINE) 5 MG tablet Take 5 mg by mouth 3 (three) times daily. 03/10/19  Yes [provider]  naproxen (NAPROSYN) 500 MG tablet Take 1 tablet (500 mg total) by mouth 2 (two) times daily with a meal. Patient taking differently: Take 500 mg by mouth 2 (two) times daily as needed for mild pain or moderate pain.  06/02/19  Yes Sanjuana Kava, MD  NOVOLIN 70/30 RELION (70-30) 100 UNIT/ML injection INJECT 190 UNITS SUBCUTANEOUSLY ONCE DAILY WITH BREAKFAST Patient taking differently: Inject 100 Units into the skin daily with breakfast.  05/29/19  Yes Renato Shin, MD  potassium chloride (KLOR-CON) 10 MEQ tablet TAKE 3 TABLETS BY MOUTH THREE TIMES DAILY Patient taking differently: Take 30 mEq by mouth 3 (three) times daily.  07/13/19  Yes Fayrene Helper, MD  pravastatin (PRAVACHOL) 20 MG tablet TAKE 1 TABLET BY MOUTH ONCE DAILY BEFORE BREAKFAST Patient taking differently: Take 20 mg by mouth every morning.  06/30/19  Yes Fayrene Helper, MD  spironolactone (ALDACTONE) 25 MG tablet TAKE 1 TABLET BY MOUTH ONCE DAILY Patient taking differently: Take 25 mg by mouth daily.  05/10/17  Yes Fayrene Helper, MD    torsemide (DEMADEX) 20 MG tablet Take 60 mg by mouth 2 (two) times daily.  03/25/15  Yes [provider]     Allergies:     Allergies  Allergen Reactions  . Benazepril Swelling  . Metronidazole Hives  . Mobic [Meloxicam] Hives  . Penicillins Hives and Swelling  . Sulfonamide Derivatives Hives     Physical Exam:   Vitals  Blood pressure (!) 122/42, pulse 62, temperature 97.8 F (36.6 C), temperature source Oral, resp. rate 20, height 5\' 3"  (1.6 m), weight 87.5 kg, SpO2 98 %.   1. General well developed female, laying in bed, no apparent distress  2. Normal affect and insight, Not Suicidal or Homicidal, Awake Alert, Oriented X 3.  3. No F.N deficits, ALL C.Nerves Intact, Strength 5/5 all 4 extremities, Sensation intact all 4 extremities, Plantars down going.  4. Ears and Eyes appear Normal, Conjunctivae clear, PERRLA. Moist Oral Mucosa.  5. Supple Neck, No JVD, No cervical lymphadenopathy appriciated, No Carotid Bruits.  6. Symmetrical Chest wall movement, Good air movement bilaterally, CTAB.  7. RRR, No Gallops, Rubs or Murmurs, No Parasternal Heave.  Chronic lower extremity lymphedema  8. Positive Bowel Sounds, Abdomen Soft, No tenderness, No organomegaly appriciated,No rebound -guarding or rigidity.  9.  No Cyanosis, Normal Skin Turgor, No Skin Rash or Bruise.  10. Good muscle tone,  joints appear normal , no effusions, Normal ROM.  11. No Palpable Lymph Nodes in Neck or Axillae     Data Review:    CBC Recent Labs  Lab 07/22/19 1312  WBC 4.2  HGB 6.4*  HCT 18.2*  PLT 253  MCV 98.4  MCH 34.6*  MCHC 35.2  RDW 14.3   ------------------------------------------------------------------------------------------------------------------  Chemistries  Recent  Labs  Lab 07/22/19 1312  NA 131*  K 3.7  CL 92*  CO2 24  GLUCOSE 107*  BUN 154*  CREATININE 3.05*  CALCIUM 10.1  AST 19  ALT 20  ALKPHOS 87  BILITOT 0.5    ------------------------------------------------------------------------------------------------------------------ estimated creatinine clearance is 18.2 mL/min (A) (by C-G formula based on SCr of 3.05 mg/dL (H)). ------------------------------------------------------------------------------------------------------------------ Recent Labs    07/22/19 1312  TSH 1.411    Coagulation profile No results for input(s): INR, PROTIME in the last 168 hours. ------------------------------------------------------------------------------------------------------------------- No results for input(s): DDIMER in the last 72 hours. -------------------------------------------------------------------------------------------------------------------  Cardiac Enzymes No results for input(s): CKMB, TROPONINI, MYOGLOBIN in the last 168 hours.  Invalid input(s): CK ------------------------------------------------------------------------------------------------------------------    Component Value Date/Time   BNP 423.0 (H) 07/22/2019 1312     ---------------------------------------------------------------------------------------------------------------  Urinalysis    Component Value Date/Time   COLORURINE YELLOW 04/16/2012 1006   APPEARANCEUR CLEAR 04/16/2012 1006   LABSPEC 1.020 04/16/2012 1006   PHURINE 6.5 04/16/2012 1006   GLUCOSEU NEGATIVE 04/16/2012 1006   HGBUR SMALL (A) 04/16/2012 1006   BILIRUBINUR NEGATIVE 04/16/2012 1006   BILIRUBINUR negative 03/04/2012 1709   KETONESUR NEGATIVE 04/16/2012 1006   PROTEINUR NEGATIVE 04/16/2012 1006   UROBILINOGEN 0.2 04/16/2012 1006   NITRITE NEGATIVE 04/16/2012 1006   LEUKOCYTESUR NEGATIVE 04/16/2012 1006    ----------------------------------------------------------------------------------------------------------------   Imaging Results:    DG Chest 2 View  Result Date: 07/22/2019 CLINICAL DATA:  Shortness of breath on exertion. EXAM: CHEST  - 2 VIEW COMPARISON:  Single-view of the chest 05/29/2010. FINDINGS: There is mild interstitial edema with trace pleural effusions. Heart size is normal. Atherosclerosis is noted. No pneumothorax. No acute bony abnormality. IMPRESSION: Mild interstitial edema and trace pleural effusions. Aortic Atherosclerosis (ICD10-I70.0). Electronically Signed   By: Inge Rise M.D.   On: 07/22/2019 13:18    My personal review of EKG: Rhythm NSR, Rate  57 /min, QTc 476   Assessment & Plan:    Active Problems:   CVA (cerebral vascular accident) ([place])   Hypertriglyceridemia   Diabetic neuropathy ([facility])   Anemia in chronic kidney disease   Chronic kidney disease, stage IV (severe) ([facility])   Symptomatic anemia    Symptomatic anemia -This is most likely in the setting of anemia of chronic kidney disease, she denies any significant GI bleed, no melena, bright red blood per rectum, or coffee-ground emesis, she is Hemoccult negative in ED, but she does have significant drop of her Hgb over last  couple month from 12>>6.4, I will go ahead and obtain CT abdomen and pelvis to rule out any retroperitoneal bleed, or blood collection in abdomen, she has evaded ferritin and iron level, so there is no indication for IV iron. -She is ordered 2 units PRBC transfusion in ED, will give IV Lasix after second units, and give her home dose torsemide after the first unit. -For now I will hold her home dose aspirin, and keep on SCD for DVT prophylaxis pending her CT abdomen and pelvis results.  AKI on CKD stage IV -Baseline creatinine is 2.4, elevated at 3 during hospital stay, will avoid nephrotoxic medications, blood pressure on the lower side, so I will hold antihypertensive regimen for now. -Monitor BMP daily  Hypertension -She had few low BP readings while in ED, I will hold her antihypertensive regimen, but will resume only on metoprolol given hypertrophic cardiomyopathy .   Diabetes mellitus -She is on Novolin  70/30 at 100 units daily, and she is on regular insulin  60 units with supper, I will go ahead and start her on Novolin 70/30 25 units twice daily, and adjust as needed.will add ISS as well.   Hyperlipidemia -Continue with Zetia  History of hypertrophic cardiomyopathy -heart rate is acceptable, she has chronic lower extremity swelling with lymphedema. -I will resume her on metoprolol, but will decrease her dose by half(from 100mg  >50 mg)  given heart rate of 57, I will add on midodrine given soft blood pressure.  DVT Prophylaxis SCDs   AM Labs Ordered, also please review Full Orders  Family Communication: Admission, patients condition and plan of care including tests being ordered have been discussed with the patient and husband at bedsdie who indicate understanding and agree with the plan and Code Status  Code Status Full  Likely DC to  Home  Condition GUARDED    Consults called: None  Admission status: Observation  Time spent in minutes : 60 minutes   Phillips Climes M.D on 07/22/2019 at 7:26 PM   [facility] - Office  [phone]

## 2019-07-22 NOTE — Progress Notes (Signed)
First unit of PRBC infusing. Pt's vitals are stable. Will continue to monitor pt.

## 2019-07-23 DIAGNOSIS — D631 Anemia in chronic kidney disease: Secondary | ICD-10-CM

## 2019-07-23 DIAGNOSIS — E0849 Diabetes mellitus due to underlying condition with other diabetic neurological complication: Secondary | ICD-10-CM | POA: Diagnosis not present

## 2019-07-23 DIAGNOSIS — N184 Chronic kidney disease, stage 4 (severe): Secondary | ICD-10-CM | POA: Diagnosis not present

## 2019-07-23 DIAGNOSIS — N183 Chronic kidney disease, stage 3 unspecified: Secondary | ICD-10-CM | POA: Diagnosis not present

## 2019-07-23 LAB — BPAM RBC
Blood Product Expiration Date: 202107062359
Blood Product Expiration Date: 202107142359
ISSUE DATE / TIME: 202106232028
ISSUE DATE / TIME: 202106232347
Unit Type and Rh: 6200
Unit Type and Rh: 6200

## 2019-07-23 LAB — TYPE AND SCREEN
ABO/RH(D): A POS
Antibody Screen: NEGATIVE
Unit division: 0
Unit division: 0

## 2019-07-23 LAB — CBC
HCT: 23.9 % — ABNORMAL LOW (ref 36.0–46.0)
Hemoglobin: 8.5 g/dL — ABNORMAL LOW (ref 12.0–15.0)
MCH: 32.9 pg (ref 26.0–34.0)
MCHC: 35.6 g/dL (ref 30.0–36.0)
MCV: 92.6 fL (ref 80.0–100.0)
Platelets: 220 10*3/uL (ref 150–400)
RBC: 2.58 MIL/uL — ABNORMAL LOW (ref 3.87–5.11)
RDW: 14.7 % (ref 11.5–15.5)
WBC: 5.2 10*3/uL (ref 4.0–10.5)
nRBC: 0 % (ref 0.0–0.2)

## 2019-07-23 LAB — HIV ANTIBODY (ROUTINE TESTING W REFLEX): HIV Screen 4th Generation wRfx: NONREACTIVE

## 2019-07-23 LAB — GLUCOSE, CAPILLARY
Glucose-Capillary: 84 mg/dL (ref 70–99)
Glucose-Capillary: 208 mg/dL — ABNORMAL HIGH (ref 70–99)

## 2019-07-23 LAB — BASIC METABOLIC PANEL
Anion gap: 17 — ABNORMAL HIGH (ref 5–15)
BUN: 150 mg/dL — ABNORMAL HIGH (ref 8–23)
CO2: 25 mmol/L (ref 22–32)
Calcium: 10.2 mg/dL (ref 8.9–10.3)
Chloride: 90 mmol/L — ABNORMAL LOW (ref 98–111)
Creatinine, Ser: 2.77 mg/dL — ABNORMAL HIGH (ref 0.44–1.00)
GFR calc Af Amer: 19 mL/min — ABNORMAL LOW (ref >60)
GFR calc non Af Amer: 17 mL/min — ABNORMAL LOW (ref >60)
Glucose, Bld: 112 mg/dL — ABNORMAL HIGH (ref 70–99)
Potassium: 2.9 mmol/L — ABNORMAL LOW (ref 3.5–5.1)
Sodium: 132 mmol/L — ABNORMAL LOW (ref 135–145)

## 2019-07-23 LAB — HEMOGLOBIN A1C
Hgb A1c MFr Bld: 7.5 % — ABNORMAL HIGH (ref 4.8–5.6)
Mean Plasma Glucose: 168.55 mg/dL

## 2019-07-23 LAB — SARS CORONAVIRUS 2 (TAT 6-24 HRS): SARS Coronavirus 2: NEGATIVE

## 2019-07-23 MED ORDER — ASPIRIN 325 MG PO TABS: 325.0000 mg | ORAL_TABLET | Freq: Every morning | ORAL | 0 refills | Status: DC

## 2019-07-23 MED ORDER — TORSEMIDE 20 MG PO TABS: 40.0000 mg | ORAL_TABLET | Freq: Two times a day (BID) | ORAL | 2 refills | Status: DC

## 2019-07-23 MED ORDER — POTASSIUM CHLORIDE CRYS ER 20 MEQ PO TBCR
40.0000 meq | EXTENDED_RELEASE_TABLET | Freq: Once | ORAL | Status: AC
  Administered 2019-07-23: 40 meq via ORAL
  Filled 2019-07-23: qty 2

## 2019-07-23 MED ORDER — METOPROLOL TARTRATE 50 MG PO TABS: 50.0000 mg | ORAL_TABLET | Freq: Two times a day (BID) | ORAL | 1 refills | Status: DC

## 2019-07-23 NOTE — Discharge Summary (Signed)
Physician Discharge Summary [facility]    Patient: Krista Strickland                   Admit date: 07/22/2019   DOB: 1950-10-06             Discharge date:07/23/2019/9:12 AM [id number]                          PCP: Fayrene Helper, MD  Disposition: Home  Recommendations for Outpatient Follow-up:   . Follow up: in 1 week  Discharge Condition: Stable   Code Status:   Code Status: Full Code  Diet recommendation: Cardiac diet   Discharge Diagnoses:    Active Problems:   CVA (cerebral vascular accident) ([place])   Hypertriglyceridemia   Diabetic neuropathy (HCC)   Anemia in chronic kidney disease   Chronic kidney disease, stage IV (severe) (HCC)   Symptomatic anemia   History of Present Illness/ Hospital Course Krista Strickland Summary:    Auda Finfrock  is a 69 y.o. female, with past medical history of diabetes mellitus, hypertension, GERD, chronic kidney disease stage III, chronic lower back pain, followed by neurosurgery, patient was with regular follow-up with cardiology today for preop evaluation for possible need for neurosurgery given her chronic lower back pain, does report significant progressive dyspnea over the last couple weeks, she was noted to be significantly dyspneic at cardiologist office so she was sent to ED for further evaluation, patient reports she has been having progressive dyspnea for 1 to 2 weeks, she does report generalized weakness, fatigue, she denies any chest pain, cough, fever, chills, any Covid exposure, no palpitation or diaphoresis, patient does report chronic lower extremity edema secondary to lymphedema, reports exertional dyspnea, denies any orthopnea. - in ED work-up significant for hemoglobin of 6.4, baseline was around 12 last May, as well her creatinine noted to be elevated at 3, from baseline of 2.4, blood pressure as well remains on the lower side, she is Hemoccult negative, she denies any melena, or coffee-ground emesis, she was  ordered 2 units PRBC, and [facility] consulted to admit.  Symptomatic anemia The patient was subsequently admitted, 2 units of PRBC was transfused.  Hemoglobin was 6.4 >>  has improved to 8.5.  Hemoccult x2 was negative, iron studies within normal limits.  Patient had a normal colonoscopy 2 years ago with benign polyp removal.  Acute on chronic anemia was thought to be due to chronic disease, specifically CKD. Patient instructed follow with PCP and nephrology for evaluation patient may be a candidate for Epogen. Currently stable ready to be discharged home.  AKI on CKD stage IV -Baseline creatinine is 2.4, elevated at 3 during hospital stay, will avoid nephrotoxic medications, blood pressure on the lower side, so I will hold antihypertensive regimen for now. -Monitor BMP daily  Hypertension -She had few low BP readings while in ED, I will hold her antihypertensive regimen, but will resume only on metoprolol given hypertrophic cardiomyopathy .   Diabetes mellitus -She is on Novolin 70/30 at 100 units daily, and she is on regular insulin 60 units with supper, I will go ahead and start her on Novolin 70/30 25 units twice daily, and adjust as needed.will add ISS as well.   Hyperlipidemia -Continue with Zetia  History of hypertrophic cardiomyopathy -heart rate is acceptable, she has chronic lower extremity swelling with lymphedema. -I will resume her on metoprolol, but will decrease her dose by half(from 100mg  >50  mg)  given heart rate of 57, I will add on midodrine given soft blood pressure.  Also her Torsemide was reduced from 60 mg twice daily to 40 mg twice daily  Instructed to resume her torsemide and aspirin next 2 to 3 days   Nutritional status:          Discharge Instructions:   Discharge Instructions    (HEART FAILURE PATIENTS) Call MD:  Anytime you have any of the following symptoms: 1) 3 pound weight gain in 24 hours or 5 pounds in 1 week 2) shortness of  breath, with or without a dry hacking cough 3) swelling in the hands, feet or stomach 4) if you have to sleep on extra pillows at night in order to breathe.   Complete by: As directed    Activity as tolerated - No restrictions   Complete by: As directed    Call MD for:  difficulty breathing, headache or visual disturbances   Complete by: As directed    Call MD for:  redness, tenderness, or signs of infection (pain, swelling, redness, odor or green/yellow discharge around incision site)   Complete by: As directed    Call MD for:  temperature >100.4   Complete by: As directed    Diet - low sodium heart healthy   Complete by: As directed    Discharge instructions   Complete by: As directed    Please take your medication as instructed.  Resume your aspirin in next 3 to 4 days, your torsemide dosage has been reduced from 60 to 40 mg twice daily may restart in 2 to 4 days. Need to follow-up with nephrology regarding your chronic kidney disease, and current anemia. You may be a candidate for Epogen shots to help with your anemia   Increase activity slowly   Complete by: As directed        Medication List    STOP taking these medications   naproxen 500 MG tablet Commonly known as: NAPROSYN   spironolactone 25 MG tablet Commonly known as: ALDACTONE     TAKE these medications   allopurinol 300 MG tablet Commonly known as: ZYLOPRIM Take 1 tablet by mouth once daily What changed: when to take this   aspirin 325 MG tablet Take 1 tablet (325 mg total) by mouth every morning. Start taking on: July 25, 2019 What changed: These instructions start on July 25, 2019. If you are unsure what to do until then, ask your doctor or other care provider.   Dilt-XR 180 MG 24 hr capsule Generic drug: diltiazem Take 1 capsule by mouth once daily What changed:   how much to take  when to take this   docusate sodium 100 MG capsule Commonly known as: COLACE Take 100 mg by mouth daily as needed  for mild constipation or moderate constipation.   ezetimibe 10 MG tablet Commonly known as: ZETIA Take 1 tablet by mouth once daily What changed: when to take this   gabapentin 600 MG tablet Commonly known as: NEURONTIN Take 600 mg by mouth 3 (three) times daily.   hydrALAZINE 25 MG tablet Commonly known as: APRESOLINE Take one tablet by mouth two times daily, at 9am and 9 pm What changed:   how much to take  how to take this  when to take this  additional instructions   HYDROcodone-acetaminophen 5-325 MG tablet Commonly known as: NORCO/VICODIN One tablet every six hours for pain.  Limit 7 days. What changed:   how much to  take  how to take this  when to take this  reasons to take this  additional instructions   insulin regular 100 units/mL injection Commonly known as: NovoLIN R ReliOn Inject 0.6 mLs (60 Units total) into the skin daily with supper. E11.9 What changed: how much to take   metolazone 5 MG tablet Commonly known as: ZAROXOLYN Take 1 tablet by mouth once daily   metoprolol tartrate 50 MG tablet Commonly known as: LOPRESSOR Take 1 tablet (50 mg total) by mouth 2 (two) times daily. What changed:   medication strength  how much to take   midodrine 5 MG tablet Commonly known as: PROAMATINE Take 5 mg by mouth 3 (three) times daily.   NovoLIN 70/30 ReliOn (70-30) 100 UNIT/ML injection Generic drug: insulin NPH-regular Human INJECT 190 UNITS SUBCUTANEOUSLY ONCE DAILY WITH BREAKFAST What changed: See the new instructions.   potassium chloride 10 MEQ tablet Commonly known as: KLOR-CON TAKE 3 TABLETS BY MOUTH THREE TIMES DAILY   pravastatin 20 MG tablet Commonly known as: PRAVACHOL TAKE 1 TABLET BY MOUTH ONCE DAILY BEFORE BREAKFAST What changed: See the new instructions.   torsemide 20 MG tablet Commonly known as: DEMADEX Take 2 tablets (40 mg total) by mouth 2 (two) times daily. Start taking on: July 25, 2019 What changed:   how much  to take  These instructions start on July 25, 2019. If you are unsure what to do until then, ask your doctor or other care provider.       Allergies  Allergen Reactions  . Benazepril Swelling  . Metronidazole Hives  . Mobic [Meloxicam] Hives  . Penicillins Hives and Swelling  . Sulfonamide Derivatives Hives     Procedures /Studies:   CT ABDOMEN PELVIS WO CONTRAST  Result Date: 07/22/2019 CLINICAL DATA:  Worsening shortness of breath. EXAM: CT ABDOMEN AND PELVIS WITHOUT CONTRAST TECHNIQUE: Multidetector CT imaging of the abdomen and pelvis was performed following the standard protocol without IV contrast. COMPARISON:  March 04, 2012 FINDINGS: Lower chest: Mild areas of atelectasis and/or infiltrate are seen within the bilateral lung bases. A 9 mm calcified right basilar lung nodule is seen. There are small bilateral pleural effusions, right greater than left. Hepatobiliary: No focal liver abnormality is seen. No gallstones, gallbladder wall thickening, or biliary dilatation. Pancreas: Unremarkable. No pancreatic ductal dilatation or surrounding inflammatory changes. Spleen: A subcentimeter calcified granuloma is seen within the anterior aspect of the spleen. Adrenals/Urinary Tract: Stomach/Bowel: Stomach is within normal limits. The appendix is not clearly identified. No evidence of bowel wall thickening, distention, or inflammatory changes. Noninflamed diverticula are seen throughout the sigmoid colon Vascular/Lymphatic: There is moderate severity calcification of the abdominal aorta. No enlarged abdominal or pelvic lymph nodes. Reproductive: Status post hysterectomy. Adjacent 2.3 cm and 1.8 cm cyst is seen along the posterior aspect of the right adnexa. Other: It should be noted that the study is limited secondary to patient motion. Musculoskeletal: Degenerative changes seen within the lumbar spine. IMPRESSION: 1. Mild areas of atelectasis and/or infiltrate within the bilateral lung bases. 2.  Small bilateral pleural effusions, right greater than left. 3. Noninflamed sigmoid diverticulosis. 4. Adjacent right adnexal cysts, likely ovarian in origin. Correlation with pelvic ultrasound is recommended. 5. Aortic atherosclerosis. Aortic Atherosclerosis (ICD10-I70.0). Electronically Signed   By: Virgina Norfolk M.D.   On: 07/22/2019 20:12   DG Chest 2 View  Result Date: 07/22/2019 CLINICAL DATA:  Shortness of breath on exertion. EXAM: CHEST - 2 VIEW COMPARISON:  Single-view of the chest  05/29/2010. FINDINGS: There is mild interstitial edema with trace pleural effusions. Heart size is normal. Atherosclerosis is noted. No pneumothorax. No acute bony abnormality. IMPRESSION: Mild interstitial edema and trace pleural effusions. Aortic Atherosclerosis (ICD10-I70.0). Electronically Signed   By: Inge Rise M.D.   On: 07/22/2019 13:18     Subjective:   Patient was seen and examined 07/23/2019, 9:12 AM Patient stable today. No acute distress.  No issues overnight Stable for discharge.  Discharge Exam:    Vitals:   07/23/19 0012 07/23/19 0201 07/23/19 0239 07/23/19 0604  BP: 134/70 127/60 132/79 (!) 121/95  Pulse: 63 (!) 59 65 65  Resp: 16 20 16 20   Temp: 98.1 F (36.7 C) 97.8 F (36.6 C) 98.4 F (36.9 C) 98.4 F (36.9 C)  TempSrc: Oral Oral Oral Oral  SpO2: 100% 99% 100% 100%  Weight:      Height:        General: Pt lying comfortably in bed & appears in no obvious distress. Cardiovascular: S1 & S2 heard, RRR, S1/S2 +. No murmurs, rubs, gallops or clicks. No JVD or pedal edema. Respiratory: Clear to auscultation without wheezing, rhonchi or crackles. No increased work of breathing. Abdominal:  Non-distended, non-tender & soft. No organomegaly or masses appreciated. Normal bowel sounds heard. CNS: Alert and oriented. No focal deficits. Extremities: no edema, no cyanosis    The results of significant diagnostics from this hospitalization (including imaging, microbiology,  ancillary and laboratory) are listed below for reference.      Microbiology:   Recent Results (from the past 240 hour(s))  SARS CORONAVIRUS 2 (TAT 6-24 HRS) Nasopharyngeal Nasopharyngeal Swab     Status: None   Collection Time: 07/22/19  4:07 PM   Specimen: Nasopharyngeal Swab  Result Value Ref Range Status   SARS Coronavirus 2 NEGATIVE NEGATIVE Final    Comment: (NOTE) SARS-CoV-2 target nucleic acids are NOT DETECTED.  The SARS-CoV-2 RNA is generally detectable in upper and lower respiratory specimens during the acute phase of infection. Negative results do not preclude SARS-CoV-2 infection, do not rule out co-infections with other pathogens, and should not be used as the sole basis for treatment or other patient management decisions. Negative results must be combined with clinical observations, patient history, and epidemiological information. The expected result is Negative.  Fact Sheet for Patients: SugarRoll.be  Fact Sheet for Healthcare Providers: https://www.woods-mathews.com/  This test is not yet approved or cleared by the [place] FDA and  has been authorized for detection and/or diagnosis of SARS-CoV-2 by FDA under an Emergency Use Authorization (EUA). This EUA will remain  in effect (meaning this test can be used) for the duration of the COVID-19 declaration under Se ction 564(b)(1) of the Act, 21 U.S.C. section 360bbb-3(b)(1), unless the authorization is terminated or revoked sooner.  Performed at [facility], [place] [address] [place], [place] [address]      Labs:   CBC: Recent Labs  Lab 07/22/19 1312 07/23/19 0458  WBC 4.2 5.2  HGB 6.4* 8.5*  HCT 18.2* 23.9*  MCV 98.4 92.6  PLT 253 245   Basic Metabolic Panel: Recent Labs  Lab 07/22/19 1312 07/23/19 0458  NA 131* 132*  K 3.7 2.9*  CL 92* 90*  CO2 24 25  GLUCOSE 107* 112*  BUN 154* 150*  CREATININE 3.05* 2.77*  CALCIUM 10.1 10.2    Liver Function Tests: Recent Labs  Lab 07/22/19 1312  AST 19  ALT 20  ALKPHOS 87  BILITOT 0.5  PROT 7.4  ALBUMIN 4.2   BNP (last 3 results) Recent Labs    07/22/19 1312  BNP 423.0*   Cardiac Enzymes: No results for input(s): CKTOTAL, CKMB, CKMBINDEX, TROPONINI in the last 168 hours. CBG: Recent Labs  Lab 07/22/19 2148 07/23/19 0740  GLUCAP 185* 84   Hgb A1c Recent Labs    07/23/19 0458  HGBA1C 7.5*   Lipid Profile No results for input(s): CHOL, HDL, LDLCALC, TRIG, CHOLHDL, LDLDIRECT in the last 72 hours. Thyroid function studies Recent Labs    07/22/19 1312  TSH 1.411   Anemia work up Recent Labs    07/22/19 1710  VITAMINB12 486  FOLATE 10.9  FERRITIN 406*  TIBC 379  IRON 336*  RETICCTPCT 0.4   Urinalysis    Component Value Date/Time   COLORURINE YELLOW 04/16/2012 1006   APPEARANCEUR CLEAR 04/16/2012 1006   LABSPEC 1.020 04/16/2012 1006   PHURINE 6.5 04/16/2012 1006   GLUCOSEU NEGATIVE 04/16/2012 1006   HGBUR SMALL (A) 04/16/2012 1006   BILIRUBINUR NEGATIVE 04/16/2012 1006   BILIRUBINUR negative 03/04/2012 1709   KETONESUR NEGATIVE 04/16/2012 1006   PROTEINUR NEGATIVE 04/16/2012 1006   UROBILINOGEN 0.2 04/16/2012 1006   NITRITE NEGATIVE 04/16/2012 1006   LEUKOCYTESUR NEGATIVE 04/16/2012 1006         Time coordinating discharge: Over 45 minutes  SIGNED: Deatra James, MD, FACP, FHM. [facility],  Please use [email or web address] to Page If 7PM-7AM, please contact night-coverage [email or web address], Password [facility] 07/23/2019, 9:12 AM

## 2019-07-23 NOTE — Progress Notes (Signed)
Second unit of PRBC infusing. Pt's vitals are stable. Will continue to monitor pt.

## 2019-07-24 ENCOUNTER — Telehealth: Payer: Self-pay | Admitting: *Deleted

## 2019-07-24 ENCOUNTER — Ambulatory Visit: Payer: PPO | Admitting: Cardiology

## 2019-07-24 NOTE — Telephone Encounter (Signed)
LVM for pt to call the office regarding Transition of care d/c 07-23-19

## 2019-07-28 ENCOUNTER — Ambulatory Visit (INDEPENDENT_AMBULATORY_CARE_PROVIDER_SITE_OTHER): Payer: PPO | Admitting: Family Medicine

## 2019-07-28 ENCOUNTER — Encounter: Payer: Self-pay | Admitting: Family Medicine

## 2019-07-28 ENCOUNTER — Other Ambulatory Visit: Payer: Self-pay

## 2019-07-28 VITALS — BP 120/57 | HR 66 | Ht 63.0 in | Wt 184.0 lb

## 2019-07-28 DIAGNOSIS — I10 Essential (primary) hypertension: Secondary | ICD-10-CM | POA: Diagnosis not present

## 2019-07-28 DIAGNOSIS — D649 Anemia, unspecified: Secondary | ICD-10-CM | POA: Diagnosis not present

## 2019-07-28 DIAGNOSIS — Z794 Long term (current) use of insulin: Secondary | ICD-10-CM

## 2019-07-28 DIAGNOSIS — N184 Chronic kidney disease, stage 4 (severe): Secondary | ICD-10-CM

## 2019-07-28 DIAGNOSIS — Z7689 Persons encountering health services in other specified circumstances: Secondary | ICD-10-CM

## 2019-07-28 DIAGNOSIS — E1140 Type 2 diabetes mellitus with diabetic neuropathy, unspecified: Secondary | ICD-10-CM | POA: Diagnosis not present

## 2019-07-28 NOTE — Progress Notes (Signed)
   Krista Strickland     MRN: [id number]      DOB: 1950-12-04   HPI Krista Strickland is here for follow up of recent hospitalization ,from 6/23 to 6/24/201 with a diagnosis of symptomatic anemia source thought to be renal.  Of note she had colonoscopy 2 years ago with removal of a benign polyp. Hospital course and appropriate f/u are reviewed with patient and Krista Strickland spouse. No medication is brought to visit, only the discharge paperwork Spouse states she is gradually improving.  Is following Krista Strickland says still says that she is far from back to Krista Strickland baseline and feels as though she and feels lightheaded at times.  Nursing visits and in were offered and Mr Jabs adamantly refused. An urgent appointment with nephrology is needed to evaluate Krista Strickland for possible Epogen. ROS  See HPI  Denies recent fever or chills. Denies sinus pressure, nasal congestion, ear pain or sore throat. Denies chest congestion, productive cough or wheezing. Denies chest pains, palpitations and leg swelling Denies abdominal pain, nausea, vomiting,diarrhea or constipation.   Denies dysuria, frequency, hesitancy or incontinence.  Denies headaches, seizures, numbness, or tingling. Denies depression, anxiety or insomnia. Denies skin break down or rash.   PE BP (!) 120/57   Pulse 66   Ht 5\' 3"  (1.6 m)   Wt 184 lb (83.5 kg)   SpO2 94%   BMI 32.59 kg/m   Patient alert  and in no cardiopulmonary distress.  HEENT: No facial asymmetry, EOMI,     Neck supple .  Chest: Clear to auscultation bilaterally.  CVS: S1, S2 no murmurs, no S3.Regular rate.  ABD: Soft non tender.   Ext: No edema  MS: Adequate ROM spine, shoulders, hips and knees.  Skin: Intact, no ulcerations or rash noted.  Psych: Good eye contact, normal affect. Memory intact not anxious or depressed appearing.  CNS: CN 2-12 intact, power,  normal throughout.no focal deficits noted.   Assessment & Plan  CKD (chronic kidney disease) stage 4, GFR 15-29  ml/min (HCC) Needs hospiytal f/u with nephrology urgently due to deterioration and symptomatic anemia  Symptomatic anemia Required transfusion, needs Nephrology eval may need epogen  Encounter for support and coordination of transition of care Patient in for follow up of recent hospitalization. Discharge summary, and laboratory and radiology data are reviewed, and any questions or concerns about recent hospitalization are discussed. Specific issues requiring follow up are specifically addressed.   Type 2 diabetes mellitus with diabetic neuropathy, unspecified (HCC) Controlled, no change in medication, pt taking anfd neededing lower dose of insulin   Essential hypertension Controlled, no change in medication

## 2019-07-28 NOTE — Assessment & Plan Note (Signed)
Needs hospiytal f/u with nephrology urgently due to deterioration and symptomatic anemia

## 2019-07-28 NOTE — Assessment & Plan Note (Addendum)
Required transfusion, needs Nephrology eval may need epogen

## 2019-07-28 NOTE — Patient Instructions (Addendum)
F/U in office with MD in 5 weeks, call if you need me sooner  We will get appointment with the Nephrologist , Dr Marylene Buerger and give you the appointment information  PLEASE be careful not to fall  Blood sugar is good  Lab today, CBC, chem 7 and EGFR  Thanks for choosing [facility], we consider it a privelige to serve you.

## 2019-07-29 ENCOUNTER — Encounter: Payer: Self-pay | Admitting: Family Medicine

## 2019-07-29 DIAGNOSIS — Z452 Encounter for adjustment and management of vascular access device: Secondary | ICD-10-CM | POA: Insufficient documentation

## 2019-07-29 LAB — BASIC METABOLIC PANEL WITH GFR
BUN/Creatinine Ratio: 48 (calc) — ABNORMAL HIGH (ref 6–22)
BUN: 134 mg/dL — ABNORMAL HIGH (ref 7–25)
CO2: 27 mmol/L (ref 20–32)
Calcium: 10 mg/dL (ref 8.6–10.4)
Chloride: 90 mmol/L — ABNORMAL LOW (ref 98–110)
Creat: 2.8 mg/dL — ABNORMAL HIGH (ref 0.50–0.99)
GFR, Est African American: 19 mL/min/{1.73_m2} — ABNORMAL LOW (ref >=60)
GFR, Est Non African American: 17 mL/min/{1.73_m2} — ABNORMAL LOW (ref >=60)
Glucose, Bld: 228 mg/dL — ABNORMAL HIGH (ref 65–99)
Potassium: 3.8 mmol/L (ref 3.5–5.3)
Sodium: 127 mmol/L — ABNORMAL LOW (ref 135–146)

## 2019-07-29 LAB — CBC
HCT: 23 % — ABNORMAL LOW (ref 35.0–45.0)
Hemoglobin: 8.1 g/dL — ABNORMAL LOW (ref 11.7–15.5)
MCH: 32.8 pg (ref 27.0–33.0)
MCHC: 35.2 g/dL (ref 32.0–36.0)
MCV: 93.1 fL (ref 80.0–100.0)
MPV: 10.2 fL (ref 7.5–12.5)
Platelets: 233 10*3/uL (ref 140–400)
RBC: 2.47 10*6/uL — ABNORMAL LOW (ref 3.80–5.10)
RDW: 14.1 % (ref 11.0–15.0)
WBC: 4.3 10*3/uL (ref 3.8–10.8)

## 2019-07-29 NOTE — Assessment & Plan Note (Signed)
Controlled, no change in medication, pt taking anfd neededing lower dose of insulin

## 2019-07-29 NOTE — Assessment & Plan Note (Signed)
Controlled, no change in medication  

## 2019-07-29 NOTE — Assessment & Plan Note (Signed)
Patient in for follow up of recent hospitalization. Discharge summary, and laboratory and radiology data are reviewed, and any questions or concerns about recent hospitalization are discussed. Specific issues requiring follow up are specifically addressed.  

## 2019-07-31 ENCOUNTER — Telehealth: Payer: Self-pay | Admitting: Family Medicine

## 2019-07-31 NOTE — Telephone Encounter (Signed)
LVM for pt to contact the office to schedule an appt. For her surgery clearance.

## 2019-08-05 DIAGNOSIS — R809 Proteinuria, unspecified: Secondary | ICD-10-CM | POA: Diagnosis not present

## 2019-08-05 DIAGNOSIS — D631 Anemia in chronic kidney disease: Secondary | ICD-10-CM | POA: Diagnosis not present

## 2019-08-05 DIAGNOSIS — N189 Chronic kidney disease, unspecified: Secondary | ICD-10-CM | POA: Diagnosis not present

## 2019-08-05 DIAGNOSIS — I129 Hypertensive chronic kidney disease with stage 1 through stage 4 chronic kidney disease, or unspecified chronic kidney disease: Secondary | ICD-10-CM | POA: Diagnosis not present

## 2019-08-05 DIAGNOSIS — E876 Hypokalemia: Secondary | ICD-10-CM | POA: Diagnosis not present

## 2019-08-05 DIAGNOSIS — E1122 Type 2 diabetes mellitus with diabetic chronic kidney disease: Secondary | ICD-10-CM | POA: Diagnosis not present

## 2019-08-05 DIAGNOSIS — E211 Secondary hyperparathyroidism, not elsewhere classified: Secondary | ICD-10-CM | POA: Diagnosis not present

## 2019-08-05 DIAGNOSIS — R600 Localized edema: Secondary | ICD-10-CM | POA: Diagnosis not present

## 2019-08-05 DIAGNOSIS — E1129 Type 2 diabetes mellitus with other diabetic kidney complication: Secondary | ICD-10-CM | POA: Diagnosis not present

## 2019-08-10 ENCOUNTER — Other Ambulatory Visit: Payer: Self-pay | Admitting: Family Medicine

## 2019-08-12 NOTE — Telephone Encounter (Signed)
Pt is not having surgery

## 2019-08-13 ENCOUNTER — Other Ambulatory Visit: Payer: Self-pay

## 2019-08-13 ENCOUNTER — Ambulatory Visit: Payer: PPO | Admitting: Orthopaedic Surgery

## 2019-08-13 ENCOUNTER — Encounter: Payer: Self-pay | Admitting: Orthopaedic Surgery

## 2019-08-13 VITALS — Ht 63.0 in | Wt 188.0 lb

## 2019-08-13 DIAGNOSIS — G8929 Other chronic pain: Secondary | ICD-10-CM | POA: Diagnosis not present

## 2019-08-13 DIAGNOSIS — E0849 Diabetes mellitus due to underlying condition with other diabetic neurological complication: Secondary | ICD-10-CM

## 2019-08-13 DIAGNOSIS — M5442 Lumbago with sciatica, left side: Secondary | ICD-10-CM | POA: Diagnosis not present

## 2019-08-13 MED ORDER — HYDROCODONE-ACETAMINOPHEN 5-325 MG PO TABS: ORAL_TABLET | ORAL | 0 refills | Status: DC

## 2019-08-13 NOTE — Progress Notes (Signed)
Patient VE:[facility] Krista Strickland, female DOB:09-08-1950, 69 y.o. BSJ:[id number]  Chief Complaint  Patient presents with  . Back Pain    Hurting real bad today    HPI  Krista Strickland is a 69 y.o. female who has chronic back pain.  I had sent her to neurosurgeon for possible surgery.  Before she went, she started to have problems breathing, swelling in her feet and feeling very, very weak.  She went for the appointment to the neurosurgeon and he told her she needed to see her primary care as soon as possible.  Her primary care was out of town so she went to cardiologist.  They admitted her to hospital.  She says she was severely anemic and they gave her two units of blood.  She does not have any known loss of blood.  She is feeling better.  She has swelling of the feet still.  She has appointment to see Dr. Moshe Cipro soon.  I told her to call and try to make earlier appointment to fully evaluate her situation.  She cannot have consideration of any back surgery until cause of anemia and other problems is somewhat resolved.  I will refill her pain medicine today.  Her back is tender but she has no weakness, no new trauma.   Body mass index is 33.3 kg/m.  ROS  Review of Systems  Constitutional: Positive for activity change.  Musculoskeletal: Positive for arthralgias, back pain, gait problem and joint swelling.  Neurological: Tremors:  .wkl.  All other systems reviewed and are negative.   All other systems reviewed and are negative.  The following is a summary of the past history medically, past history surgically, known current medicines, social history and family history.  This information is gathered electronically by the computer from prior information and documentation.  I review this each visit and have found including this information at this point in the chart is beneficial and informative.    Past Medical History:  Diagnosis Date  . Arthritis   . CKD (chronic kidney disease) stage  3, GFR 30-59 ml/min   . Diabetes mellitus, type 2 ([place])   . Essential hypertension   . GERD (gastroesophageal reflux disease)   . Gout   . History of MRSA infection 03/2009  . History of stroke 2013  . Hypercalcemia 2017   Managed by nephrology  . Hyperlipidemia   . Obesity   . Psoriasis   . Uterine cancer ([place])   . 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 ABDOMINAL HYSTERECTOMY    . APPENDECTOMY  2012  . COLONOSCOPY WITH PROPOFOL N/A 08/24/2016   Procedure: COLONOSCOPY WITH PROPOFOL;  Surgeon: Rogene Houston, MD;  [facility];  Service: Endoscopy;  Laterality: N/A;  10:30  . Excison of right breast cyst    . LAPAROSCOPIC SALPINGO OOPHERECTOMY Right 04/22/2012   Procedure: LAPAROSCOPIC SALPINGO OOPHORECTOMY;  Surgeon: Jonnie Kind, MD;  Location: [facility];  Service: Gynecology;  Laterality: Right;  end 11:17  . MASS EXCISION N/A 04/22/2012   Procedure: EXCISION SKIN TAGS NECK AND HEAD;  Surgeon: Jonnie Kind, MD;  Location: [facility];  Service: Gynecology;  Laterality: N/A;  start 11:19  . PARTIAL HYSTERECTOMY    . POLYPECTOMY  08/24/2016   Procedure: POLYPECTOMY;  Surgeon: Rogene Houston, MD;  [facility];  Service: Endoscopy;;  colon  . Right neck biopsy  Family History  Problem Relation Age of Onset  . Hypertension Brother   . Gout Brother   . Prostate cancer Brother   . Hypertension Brother   . Prostate cancer Brother   . Gout Brother   . Hypertension Sister   . Gout Sister   . Leukemia Sister 55  . Pancreatic cancer Sister 61  . Gout Sister   . Prostate cancer Brother   . Diabetes Neg Hx     Social History Social History   Tobacco Use  . Smoking status: Former Smoker    Packs/day: 0.25    Years: 12.00    Pack years: 3.00    Quit date: 07/16/1978    Years since quitting: 41.1  . Smokeless tobacco: Never Used  Vaping Use  . Vaping Use: Never used  Substance Use Topics  .  Alcohol use: No  . Drug use: No    Allergies  Allergen Reactions  . Benazepril Swelling  . Metronidazole Hives  . Mobic [Meloxicam] Hives  . Penicillins Hives and Swelling  . Sulfonamide Derivatives Hives    Current Outpatient Medications  Medication Sig Dispense Refill  . allopurinol (ZYLOPRIM) 300 MG tablet Take 1 tablet by mouth once daily (Patient taking differently: Take 300 mg by mouth every morning. ) 90 tablet 5  . aspirin 325 MG tablet Take 1 tablet (325 mg total) by mouth every morning. 30 tablet 0  . DILT-XR 180 MG 24 hr capsule Take 1 capsule by mouth once daily (Patient taking differently: Take 180 mg by mouth every morning. ) 90 capsule 0  . docusate sodium (COLACE) 100 MG capsule Take 100 mg by mouth daily as needed for mild constipation or moderate constipation.     Marland Kitchen ezetimibe (ZETIA) 10 MG tablet Take 1 tablet by mouth once daily (Patient taking differently: Take 10 mg by mouth every morning. ) 90 tablet 0  . gabapentin (NEURONTIN) 600 MG tablet Take 600 mg by mouth 3 (three) times daily.    . hydrALAZINE (APRESOLINE) 25 MG tablet Take one tablet by mouth two times daily, at 9am and 9 pm (Patient taking differently: Take 25 mg by mouth in the morning and at bedtime. ) 60 tablet 5  . HYDROcodone-acetaminophen (NORCO/VICODIN) 5-325 MG tablet One tablet every six hours for pain.  Limit 7 days. 28 tablet 0  . insulin regular (NOVOLIN R RELION) 100 units/mL injection Inject 0.6 mLs (60 Units total) into the skin daily with supper. E11.9 (Patient taking differently: Inject 70 Units into the skin daily with supper. E11.9) 18 mL 0  . metolazone (ZAROXOLYN) 5 MG tablet Take 1 tablet by mouth once daily (Patient taking differently: Take 5 mg by mouth daily. ) 90 tablet 0  . metoprolol tartrate (LOPRESSOR) 50 MG tablet Take 1 tablet (50 mg total) by mouth 2 (two) times daily. 30 tablet 1  . midodrine (PROAMATINE) 5 MG tablet Take 5 mg by mouth 3 (three) times daily.    Marland Kitchen NOVOLIN  70/30 RELION (70-30) 100 UNIT/ML injection INJECT 190 UNITS SUBCUTANEOUSLY ONCE DAILY WITH BREAKFAST (Patient taking differently: Inject 100 Units into the skin daily with breakfast. ) 70 mL 0  . potassium chloride (KLOR-CON) 10 MEQ tablet TAKE 3 TABLETS BY MOUTH THREE TIMES DAILY 270 tablet 1  . pravastatin (PRAVACHOL) 20 MG tablet TAKE 1 TABLET BY MOUTH ONCE DAILY BEFORE BREAKFAST (Patient taking differently: Take 20 mg by mouth every morning. ) 90 tablet 0  . torsemide (DEMADEX) 20 MG tablet Take  2 tablets (40 mg total) by mouth 2 (two) times daily. 30 tablet 2   No current facility-administered medications for this visit.     Physical Exam  Height 5\' 3"  (1.6 m), weight 188 lb (85.3 kg).  Constitutional: overall normal hygiene, normal nutrition, well developed, normal grooming, normal body habitus. Assistive device:none  Musculoskeletal: gait and station Limp none, muscle tone and strength are normal, no tremors or atrophy is present.  .  Neurological: coordination overall normal.  Deep tendon reflex/nerve stretch intact.  Sensation normal.  Cranial nerves II-XII intact.   Skin:   Normal overall no scars, lesions, ulcers or rashes. No psoriasis.  Psychiatric: Alert and oriented x 3.  Recent memory intact, remote memory unclear.  Normal mood and affect. Well groomed.  Good eye contact.  Cardiovascular: overall no swelling, no varicosities, no edema bilaterally, normal temperatures of the legs and arms, no clubbing, cyanosis and good capillary refill.  Lymphatic: palpation is normal.  Spine/Pelvis examination:  Inspection:  Overall, sacoiliac joint benign and hips nontender; without crepitus or defects.   Thoracic spine inspection: Alignment normal without kyphosis present   Lumbar spine inspection:  Alignment  with normal lumbar lordosis, without scoliosis apparent.   Thoracic spine palpation:  without tenderness of spinal processes   Lumbar spine palpation: without tenderness  of lumbar area; without tightness of lumbar muscles    Range of Motion:   Lumbar flexion, forward flexion is normal without pain or tenderness    Lumbar extension is full without pain or tenderness   Left lateral bend is normal without pain or tenderness   Right lateral bend is normal without pain or tenderness   Straight leg raising is normal  Strength & tone: normal   Stability overall normal stability  All other systems reviewed and are negative   The patient has been educated about the nature of the problem(s) and counseled on treatment options.  The patient appeared to understand what I have discussed and is in agreement with it.  Encounter Diagnoses  Name Primary?  . Chronic left-sided low back pain with left-sided sciatica Yes  . 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 PLAN Call if any problems.  Precautions discussed.  Continue current medications.   Return to clinic 3 months   I have reviewed the [facility] web site prior to prescribing narcotic medicine for this patient.   Electronically Signed Sanjuana Kava, MD 7/15/202112:43 PM

## 2019-08-14 ENCOUNTER — Other Ambulatory Visit: Payer: Self-pay | Admitting: Family Medicine

## 2019-08-17 ENCOUNTER — Ambulatory Visit: Payer: PPO | Admitting: Endocrinology

## 2019-08-17 ENCOUNTER — Other Ambulatory Visit: Payer: Self-pay

## 2019-08-17 VITALS — BP 170/68 | HR 91 | Ht 62.99 in | Wt 190.4 lb

## 2019-08-17 DIAGNOSIS — N184 Chronic kidney disease, stage 4 (severe): Secondary | ICD-10-CM

## 2019-08-17 DIAGNOSIS — Z794 Long term (current) use of insulin: Secondary | ICD-10-CM

## 2019-08-17 DIAGNOSIS — E1165 Type 2 diabetes mellitus with hyperglycemia: Secondary | ICD-10-CM

## 2019-08-17 DIAGNOSIS — E1122 Type 2 diabetes mellitus with diabetic chronic kidney disease: Secondary | ICD-10-CM | POA: Insufficient documentation

## 2019-08-17 MED ORDER — NOVOLIN 70/30 RELION (70-30) 100 UNIT/ML ~~LOC~~ SUSP: 100.0000 [IU] | Freq: Every day | SUBCUTANEOUS | 11 refills | Status: DC

## 2019-08-17 MED ORDER — INSULIN REGULAR HUMAN 100 UNIT/ML IJ SOLN: 60.0000 [IU] | Freq: Every day | INTRAMUSCULAR | 11 refills | Status: DC

## 2019-08-17 NOTE — Progress Notes (Signed)
Subjective:    Patient ID: Krista Strickland, female    DOB: 15-Dec-1950, 69 y.o.   MRN: [id number]  HPI Pt returns for f/u of diabetes.   DM type: insulin-requiring type 2 Dx'ed: 7793 Complications: PN, stage 4 CRI and CVA.  Therapy: insulin since 2010 GDM: never DKA: never Severe hypoglycemia: never.   Pancreatitis: never.   SDOH: she cannot afford insulin analogs Other: she declines weight loss surgery; she had cutaneous reactions to NPH and lantus; she has declined MDI's. so we had to try 70/30 (the NPH component caused no reaction this time).  She takes 70/30 QAM and reg QPM, due to the pattern of cbg's.   Interval history:  no cbg record, but states cbg's vary from 72-248.  It is in general higher as the day goes on. Main symptom is doe.  She says she never misses the insulin.  no recent steroids.  4 weeks ago, she was in the hospital with anemia, and insulin was changed to 70/30, BID.   Pt also has small multinodular goiter (euthyroid; f/u US in 2017 showed no change, with no thyroid nodule meeting criteria for biopsy or surveillance; she is euthyroid off rx).   Past Medical History:  Diagnosis Date  . Arthritis   . CKD (chronic kidney disease) stage 3, GFR 30-59 ml/min   . Diabetes mellitus, type 2 ([place])   . Essential hypertension   . GERD (gastroesophageal reflux disease)   . Gout   . History of MRSA infection 03/2009  . History of stroke 2013  . Hypercalcemia 2017   Managed by nephrology  . Hyperlipidemia   . Obesity   . Psoriasis   . Uterine cancer ([place])   . 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 ABDOMINAL HYSTERECTOMY    . APPENDECTOMY  2012  . COLONOSCOPY WITH PROPOFOL N/A 08/24/2016   Procedure: COLONOSCOPY WITH PROPOFOL;  Surgeon: Rogene Houston, MD;  [facility];  Service: Endoscopy;  Laterality: N/A;  10:30  . Excison of right breast cyst    . LAPAROSCOPIC SALPINGO OOPHERECTOMY Right  04/22/2012   Procedure: LAPAROSCOPIC SALPINGO OOPHORECTOMY;  Surgeon: Jonnie Kind, MD;  Location: [facility];  Service: Gynecology;  Laterality: Right;  end 11:17  . MASS EXCISION N/A 04/22/2012   Procedure: EXCISION SKIN TAGS NECK AND HEAD;  Surgeon: Jonnie Kind, MD;  Location: [facility];  Service: Gynecology;  Laterality: N/A;  start 11:19  . PARTIAL HYSTERECTOMY    . POLYPECTOMY  08/24/2016   Procedure: POLYPECTOMY;  Surgeon: Rogene Houston, MD;  [facility];  Service: Endoscopy;;  colon  . Right neck biopsy      Social History   Socioeconomic History  . Marital status: Married    Spouse name: Not on file  . Number of children: 1  . Years of education: Not on file  . Highest education level: 11th grade  Occupational History  . Occupation: disabled     Fish farm manager: UNEMPLOYED  Tobacco Use  . Smoking status: Former Smoker    Packs/day: 0.25    Years: 12.00    Pack years: 3.00    Quit date: 07/16/1978    Years since quitting: 41.1  . Smokeless tobacco: Never Used  Vaping Use  . Vaping Use: Never used  Substance and Sexual Activity  . Alcohol use: No  . Drug use: No  . Sexual activity: Yes  Birth control/protection: Surgical  Other Topics Concern  . Not on file  Social History Narrative  . Not on file   Social Determinants of Health   Financial Resource Strain:   . Difficulty of Paying Living Expenses:   Food Insecurity:   . Worried About Charity fundraiser in the Last Year:   . Arboriculturist in the Last Year:   Transportation Needs:   . Film/video editor (Medical):   Marland Kitchen Lack of Transportation (Non-Medical):   Physical Activity:   . Days of Exercise per Week:   . Minutes of Exercise per Session:   Stress:   . Feeling of Stress :   Social Connections:   . Frequency of Communication with Friends and Family:   . Frequency of Social Gatherings with Friends and Family:   . Attends Religious Services:   . Active Member of Clubs or Organizations:     . Attends Archivist Meetings:   Marland Kitchen Marital Status:   Intimate Partner Violence:   . Fear of Current or Ex-Partner:   . Emotionally Abused:   Marland Kitchen Physically Abused:   . Sexually Abused:     Current Outpatient Medications on File Prior to Visit  Medication Sig Dispense Refill  . allopurinol (ZYLOPRIM) 300 MG tablet Take 1 tablet by mouth once daily (Patient taking differently: Take 300 mg by mouth every morning. ) 90 tablet 5  . aspirin 325 MG tablet Take 1 tablet (325 mg total) by mouth every morning. 30 tablet 0  . DILT-XR 180 MG 24 hr capsule Take 1 capsule by mouth once daily (Patient taking differently: Take 180 mg by mouth every morning. ) 90 capsule 0  . docusate sodium (COLACE) 100 MG capsule Take 100 mg by mouth daily as needed for mild constipation or moderate constipation.     Marland Kitchen ezetimibe (ZETIA) 10 MG tablet Take 1 tablet by mouth once daily (Patient taking differently: Take 10 mg by mouth every morning. ) 90 tablet 0  . gabapentin (NEURONTIN) 600 MG tablet Take 600 mg by mouth 3 (three) times daily.    . hydrALAZINE (APRESOLINE) 25 MG tablet TAKE 1 TABLET BY MOUTH TWICE DAILY (AT  9:00  AM  AND  9:00  PM) 180 tablet 0  . HYDROcodone-acetaminophen (NORCO/VICODIN) 5-325 MG tablet One tablet every six hours for pain.  Limit 7 days. 28 tablet 0  . metolazone (ZAROXOLYN) 5 MG tablet Take 1 tablet by mouth once daily (Patient taking differently: Take 5 mg by mouth daily. ) 90 tablet 0  . metoprolol tartrate (LOPRESSOR) 50 MG tablet Take 1 tablet (50 mg total) by mouth 2 (two) times daily. 30 tablet 1  . midodrine (PROAMATINE) 5 MG tablet Take 5 mg by mouth 3 (three) times daily.    . potassium chloride (KLOR-CON) 10 MEQ tablet TAKE 3 TABLETS BY MOUTH THREE TIMES DAILY 270 tablet 1  . pravastatin (PRAVACHOL) 20 MG tablet TAKE 1 TABLET BY MOUTH ONCE DAILY BEFORE BREAKFAST (Patient taking differently: Take 20 mg by mouth every morning. ) 90 tablet 0  . torsemide (DEMADEX) 20 MG  tablet Take 2 tablets (40 mg total) by mouth 2 (two) times daily. 30 tablet 2   No current facility-administered medications on file prior to visit.    Allergies  Allergen Reactions  . Benazepril Swelling  . Metronidazole Hives  . Mobic [Meloxicam] Hives  . Penicillins Hives and Swelling  . Sulfonamide Derivatives Hives    Family History  Problem Relation Age of Onset  . Hypertension Brother   . Gout Brother   . Prostate cancer Brother   . Hypertension Brother   . Prostate cancer Brother   . Gout Brother   . Hypertension Sister   . Gout Sister   . Leukemia Sister 64  . Pancreatic cancer Sister 68  . Gout Sister   . Prostate cancer Brother   . Diabetes Neg Hx     BP (!) 170/68 (BP Location: Left Arm, Patient Position: Sitting)   Pulse 91   Ht 5' 2.99" (1.6 m)   Wt 190 lb 6.4 oz (86.4 kg)   SpO2 92%   BMI 33.74 kg/m    Review of Systems Denies LOC    Objective:   Physical Exam VITAL SIGNS:  See vs page GENERAL: no distress Pulses: dorsalis pedis intact bilat.   MSK: no deformity of the feet CV: 2+ bilat leg edema Skin:  no ulcer on the feet.  normal color and temp on the feet. Neuro: sensation is intact to touch on the feet, but decreased from normal.    Lab Results  Component Value Date   HGBA1C 7.5 (H) 07/23/2019   Lab Results  Component Value Date   CREATININE 2.80 (H) 07/28/2019   BUN 134 (H) 07/28/2019   NA 127 (L) 07/28/2019   K 3.8 07/28/2019   CL 90 (L) 07/28/2019   CO2 27 07/28/2019        Assessment & Plan:  Insulin-requiring type 2 DM Renal failure, worse.  This is the likely reason for reduced insulin need.   Patient Instructions  HTN: is noted today.   check your blood sugar twice a day.  vary the time of day when you check, between before the 3 meals, and at bedtime.  also check if you have symptoms of your blood sugar being too high or too low.  please keep a record of the readings and bring it to your next appointment here.  You  can write it on any piece of paper.  please call us sooner if your blood sugar goes below 70, or if you have a lot of readings over 200.   Please take the 70/30 insulin: 100 units with breakfast.  However, take just 60 units on Sunday morning, and:  Please continue the regular insulin, 60 units with supper.    On this type of insulin schedule, you should eat meals on a regular schedule.  If a meal is missed or significantly delayed, your blood sugar could go low.   Please come back for a follow-up appointment in 2 months.

## 2019-08-17 NOTE — Patient Instructions (Addendum)
HTN: is noted today.   check your blood sugar twice a day.  vary the time of day when you check, between before the 3 meals, and at bedtime.  also check if you have symptoms of your blood sugar being too high or too low.  please keep a record of the readings and bring it to your next appointment here.  You can write it on any piece of paper.  please call us sooner if your blood sugar goes below 70, or if you have a lot of readings over 200.   Please take the 70/30 insulin: 100 units with breakfast.  However, take just 60 units on Sunday morning, and:  Please continue the regular insulin, 60 units with supper.    On this type of insulin schedule, you should eat meals on a regular schedule.  If a meal is missed or significantly delayed, your blood sugar could go low.   Please come back for a follow-up appointment in 2 months.

## 2019-08-20 ENCOUNTER — Encounter (HOSPITAL_COMMUNITY)
Admission: RE | Admit: 2019-08-20 | Discharge: 2019-08-20 | Disposition: A | Discharge status: Home / Self Care | Payer: PPO | Source: Ambulatory Visit | Attending: Nephrology | Admitting: Nephrology

## 2019-08-20 ENCOUNTER — Encounter (HOSPITAL_COMMUNITY): Payer: Self-pay

## 2019-08-20 ENCOUNTER — Other Ambulatory Visit: Payer: Self-pay

## 2019-08-20 DIAGNOSIS — D631 Anemia in chronic kidney disease: Secondary | ICD-10-CM | POA: Insufficient documentation

## 2019-08-20 DIAGNOSIS — N184 Chronic kidney disease, stage 4 (severe): Secondary | ICD-10-CM | POA: Insufficient documentation

## 2019-08-20 HISTORY — DX: Anemia, unspecified: D64.9

## 2019-08-20 LAB — POCT HEMOGLOBIN-HEMACUE: Hemoglobin: 7.9 g/dL — ABNORMAL LOW (ref 12.0–15.0)

## 2019-08-20 MED ORDER — EPOETIN ALFA 3000 UNIT/ML IJ SOLN
3000.0000 [IU] | Freq: Once | INTRAMUSCULAR | Status: AC
  Administered 2019-08-20: 3000 [IU] via SUBCUTANEOUS
  Filled 2019-08-20: qty 1

## 2019-08-24 ENCOUNTER — Telehealth: Payer: Self-pay

## 2019-08-24 DIAGNOSIS — M79674 Pain in right toe(s): Secondary | ICD-10-CM | POA: Diagnosis not present

## 2019-08-24 DIAGNOSIS — M79675 Pain in left toe(s): Secondary | ICD-10-CM | POA: Diagnosis not present

## 2019-08-24 DIAGNOSIS — E1140 Type 2 diabetes mellitus with diabetic neuropathy, unspecified: Secondary | ICD-10-CM | POA: Diagnosis not present

## 2019-08-24 DIAGNOSIS — B351 Tinea unguium: Secondary | ICD-10-CM | POA: Diagnosis not present

## 2019-08-24 NOTE — Telephone Encounter (Signed)
FAXED DOCUMENTS  Surgeon: Dr. Annette Stable  Document: Surgical Clearance - ENDOCRINOLOGY ONLY Other records requested: Advised office notes on Epic  All above requested information has been faxed successfully to the Company listed above. Documents and fax confirmation have been placed in the faxed file for future reference.

## 2019-08-26 ENCOUNTER — Other Ambulatory Visit: Payer: Self-pay | Admitting: Family Medicine

## 2019-09-01 ENCOUNTER — Ambulatory Visit: Payer: PPO | Admitting: Family Medicine

## 2019-09-02 ENCOUNTER — Telehealth: Payer: Self-pay

## 2019-09-02 ENCOUNTER — Ambulatory Visit (INDEPENDENT_AMBULATORY_CARE_PROVIDER_SITE_OTHER): Payer: PPO | Admitting: Family Medicine

## 2019-09-02 ENCOUNTER — Other Ambulatory Visit: Payer: Self-pay

## 2019-09-02 ENCOUNTER — Encounter: Payer: Self-pay | Admitting: Family Medicine

## 2019-09-02 VITALS — BP 120/70 | HR 67 | Temp 93.0°F | Resp 16 | Ht 63.0 in | Wt 185.0 lb

## 2019-09-02 DIAGNOSIS — R0902 Hypoxemia: Secondary | ICD-10-CM | POA: Diagnosis not present

## 2019-09-02 DIAGNOSIS — R600 Localized edema: Secondary | ICD-10-CM

## 2019-09-02 DIAGNOSIS — I10 Essential (primary) hypertension: Secondary | ICD-10-CM

## 2019-09-02 DIAGNOSIS — E669 Obesity, unspecified: Secondary | ICD-10-CM

## 2019-09-02 DIAGNOSIS — Z794 Long term (current) use of insulin: Secondary | ICD-10-CM

## 2019-09-02 DIAGNOSIS — E1122 Type 2 diabetes mellitus with diabetic chronic kidney disease: Secondary | ICD-10-CM

## 2019-09-02 DIAGNOSIS — G4730 Sleep apnea, unspecified: Secondary | ICD-10-CM

## 2019-09-02 DIAGNOSIS — N183 Chronic kidney disease, stage 3 unspecified: Secondary | ICD-10-CM | POA: Diagnosis not present

## 2019-09-02 DIAGNOSIS — D631 Anemia in chronic kidney disease: Secondary | ICD-10-CM

## 2019-09-02 DIAGNOSIS — N184 Chronic kidney disease, stage 4 (severe): Secondary | ICD-10-CM

## 2019-09-02 NOTE — Telephone Encounter (Signed)
PT NEEDS TO HAVE NEPHROLOGIST DR bUTANI REFILL, AT HIS 07 2021 VISIT, HE INCREASED THE DOSE TO EVEN MORE THAN IS ON THE HOSPITAL D/C SUMMARY

## 2019-09-02 NOTE — Telephone Encounter (Signed)
Requesting refill on torsemide 20 that was prescribed at the hospital. [facility] to refill?

## 2019-09-02 NOTE — Patient Instructions (Addendum)
Follow-up in office with with MD in the next 8 to 10 weeks call if you need me sooner.  Blood pressure today is good.   You do need supplemental Oxygen,TO USE WHEN YOU WALK AROUND AND WE WILL ORDER THIS  You are referred to lung Specialist in [place] re shortness of breaTH AND SNORING  .Think about what you will eat, plan ahead. Choose " clean, green, fresh or frozen" over canned, processed or packaged foods which are more sugary, salty and fatty. 70 to 75% of food eaten should be vegetables and fruit. Three meals at set times with snacks allowed between meals, but they must be fruit or vegetables. Aim to eat over a 12 hour period , example 7 am to 7 pm, and STOP after  your last meal of the day. Drink water,generally about 64 ounces per day, no other drink is as healthy. Fruit juice is best enjoyed in a healthy way, by EATING the fruit. Thanks for choosing [facility], we consider it a privelige to serve you.

## 2019-09-02 NOTE — Progress Notes (Signed)
Krista Strickland     MRN: [id number]      DOB: [birth year]   HPI Krista Strickland is here for follow up and re-evaluation of chronic medical conditions, medication management and review of any available recent lab and radiology data.  C/O EXCESS SHORTNESS OF BREATH AND POOR EXERCISE TOLERANCE C/O SNORING  OXYGEN ON ROOM AIR AT REST INS 91 % OXYGEN AFTER WALKING A SHORT DISTANCE FALLS TO 88% AND ONCE PT RECEIVES SUPPLEMENTAL OXYGEN AT 2 L THIS INCREASES UP TO 97%  ROS Denies recent fever or chills. Denies sinus pressure, nasal congestion, ear pain or sore throat. Denies chest congestion, productive cough or wheezing. Denies chest pains, palpitations c/o  leg swelling Denies abdominal pain, nausea, vomiting,diarrhea or constipation.   Denies dysuria, frequency, hesitancy or incontinence. C/o chronic  joint pain, swelling and limitation in mobility. Denies headaches, seizures, numbness, or tingling. Denies depression, anxiety or insomnia. Denies skin break down or rash.   PE  BP 120/70   Pulse 67   Temp (!) 93 F (33.9 C)   Resp 16   Ht 5\' 3"  (1.6 m)   Wt 185 lb (83.9 kg)   SpO2 91%   BMI 32.77 kg/m   Oxygen with ambulation falls to 88% with minimal activity Supplemental oxygen at 2liter/min post short walk pulse oc is 97%  Patient alert and oriented and in mild  cardiopulmonary distress.  HEENT: No facial asymmetry, EOMI,     Neck decreased ROM .  Chest: Clear to auscultation bilaterally.  CVS: S1, S2 no murmurs, no S3.Regular rate.  ABD: Soft non tender.   Ext: one plus pitting  Edema bilaterally  MS: decreased  ROM spine, shoulders, hips and knees.  Skin: Intact, no ulcerations or rash noted.  Psych: Good eye contact, normal affect. Memory intact not anxious or depressed appearing.  CNS: CN 2-12 intact, power,  normal throughout.no focal deficits noted.   Assessment & Plan  Sleep-disordered breathing Snoring , excessive fatigue, pulmonary to eval and  manage  Hypoxia Hypoxia withh minimal activity, pulse ox falls to 88% and folllowing 2l/min oxygen, pulse ox up to 97%. Needs and will benefit from supplemental oxygen  Essential hypertension Controlled, no change in medication DASH diet and commitment to daily physical activity for a minimum of 30 minutes discussed and encouraged, as a part of hypertension management. The importance of attaining a healthy weight is also discussed.  BP/Weight 09/03/2019 09/02/2019 08/17/2019 08/13/2019 07/28/2019 07/23/2019 4/33/2951  Systolic BP 884 166 063 - 016 010 -  Diastolic BP 71 70 68 - 57 53 -  Wt. (Lbs) - 185 190.4 188 184 - 192.9  BMI - 32.77 33.74 33.3 32.59 - 34.17       Anemia in chronic kidney disease Treated by nephrologist on twice monthly Epogen.  CKD (chronic kidney disease) stage 4, GFR 15-29 ml/min (HCC) Managed by nephrology,  Leg edema Diuretic refilled from dosing of most recent hospitalization. Pt was advised to contact her Nephrologist for appropriate dosing but did not follow-up. The dose from her hospitalization has been sent in however this is noted to be lower than that prescribed most recently by nephrology. She does have an upcoming appointment with oncology in the next 2 weeks.  Obesity (BMI 30.0-34.9)  Patient re-educated about  the importance of commitment to a  minimum of 150 minutes of exercise per week as able.  The importance of healthy food choices with portion control discussed, as well as eating regularly and within  a 12 hour window most days. The need to choose "clean , green" food 50 to 75% of the time is discussed, as well as to make water the primary drink and set a goal of 64 ounces water daily.    Weight /BMI 09/02/2019 08/17/2019 08/13/2019  WEIGHT 185 lb 190 lb 6.4 oz 188 lb  HEIGHT 5\' 3"  5' 2.992" 5\' 3"   BMI 32.77 kg/m2 33.74 kg/m2 33.3 kg/m2      Type 2 diabetes mellitus with diabetic chronic kidney disease ([place]) Krista Strickland is reminded of the  importance of commitment to daily physical activity for 30 minutes or more, as able and the need to limit carbohydrate intake to 30 to 60 grams per meal to help with blood sugar control.   The need to take medication as prescribed, test blood sugar as directed, and to call between visits if there is a concern that blood sugar is uncontrolled is also discussed.   Krista Strickland is reminded of the importance of daily foot exam, annual eye examination, and good blood sugar, blood pressure and cholesterol control. Controlled and managed by Endo  Diabetic Labs Latest Ref Rng & Units 07/28/2019 07/23/2019 07/22/2019 05/11/2019 03/11/2019  HbA1c 4.8 - 5.6 % - 7.5(H) - 7.8(A) 7.4(A)  Microalbumin Not Estab. ug/mL - - - - -  Micro/Creat Ratio 0.0 - 30.0 mg/g creat - - - - -  Chol <200 mg/dL - - - - -  HDL > OR = 50 mg/dL - - - - -  Calc LDL mg/dL (calc) - - - - -  Triglycerides <150 mg/dL - - - - -  Creatinine 0.50 - 0.99 mg/dL 2.80(H) 2.77(H) 3.05(H) - -  GFR >60.00 mL/min - - - - -   BP/Weight 09/03/2019 09/02/2019 08/17/2019 08/13/2019 07/28/2019 07/23/2019 8/50/2774  Systolic BP 128 786 767 - 209 470 -  Diastolic BP 71 70 68 - 57 53 -  Wt. (Lbs) - 185 190.4 188 184 - 192.9  BMI - 32.77 33.74 33.3 32.59 - 34.17   Foot/eye exam completion dates Latest Ref Rng & Units 08/17/2019 07/28/2019  Eye Exam No Retinopathy - -  Foot exam Order - - -  Foot Form Completion - Done Done

## 2019-09-02 NOTE — Assessment & Plan Note (Addendum)
Snoring , excessive fatigue, pulmonary to eval and manage

## 2019-09-03 ENCOUNTER — Ambulatory Visit: Payer: PPO | Admitting: Family Medicine

## 2019-09-03 ENCOUNTER — Other Ambulatory Visit: Payer: Self-pay

## 2019-09-03 ENCOUNTER — Encounter (HOSPITAL_COMMUNITY)
Admission: RE | Admit: 2019-09-03 | Discharge: 2019-09-03 | Disposition: A | Discharge status: Home / Self Care | Payer: PPO | Source: Ambulatory Visit | Attending: Nephrology | Admitting: Nephrology

## 2019-09-03 ENCOUNTER — Encounter (HOSPITAL_COMMUNITY): Payer: Self-pay

## 2019-09-03 DIAGNOSIS — D631 Anemia in chronic kidney disease: Secondary | ICD-10-CM | POA: Diagnosis not present

## 2019-09-03 DIAGNOSIS — N184 Chronic kidney disease, stage 4 (severe): Secondary | ICD-10-CM | POA: Diagnosis not present

## 2019-09-03 LAB — POCT HEMOGLOBIN-HEMACUE: Hemoglobin: 8.4 g/dL — ABNORMAL LOW (ref 12.0–15.0)

## 2019-09-03 MED ORDER — EPOETIN ALFA 3000 UNIT/ML IJ SOLN
INTRAMUSCULAR | Status: AC
  Filled 2019-09-03: qty 1

## 2019-09-03 MED ORDER — EPOETIN ALFA 3000 UNIT/ML IJ SOLN
3000.0000 [IU] | Freq: Once | INTRAMUSCULAR | Status: AC
  Administered 2019-09-03: 3000 [IU] via SUBCUTANEOUS

## 2019-09-03 MED ORDER — TORSEMIDE 20 MG PO TABS: 40.0000 mg | ORAL_TABLET | Freq: Two times a day (BID) | ORAL | 0 refills | Status: DC

## 2019-09-03 NOTE — Telephone Encounter (Signed)
Patient aware to have kidney dr refill that med

## 2019-09-04 ENCOUNTER — Telehealth: Payer: Self-pay | Admitting: Cardiology

## 2019-09-04 NOTE — Telephone Encounter (Signed)
Primary Cardiologist:Samuel Domenic Polite, MD  Chart reviewed as part of pre-operative protocol coverage. Because of Krista Strickland's past medical history and time since last visit, he/she will require a follow-up visit in order to better assess preoperative cardiovascular risk.  Pre-op covering staff: - Please schedule appointment and call patient to inform them. - Please contact requesting surgeon's office via preferred method (i.e, phone, fax) to inform them of need for appointment prior to surgery.  If applicable, this message will also be routed to pharmacy pool and/or primary cardiologist for input on holding anticoagulant/antiplatelet agent as requested below so that this information is available at time of patient's appointment.   Deberah Pelton, NP  09/04/2019, 12:40 PM

## 2019-09-04 NOTE — Telephone Encounter (Signed)
   [facility] Pre-operative Risk Assessment    HEARTCARE STAFF: - Please ensure there is not already an duplicate clearance open for this procedure. - Under Visit Info/Reason for Call, type in Other and utilize the format Clearance MM/DD/YY or Clearance TBD. Do not use dashes or single digits. - If request is for dental extraction, please clarify the # of teeth to be extracted.  Request for surgical clearance:  1. What type of surgery is being performed?  1. L4-5 LUMBAR FUSION  2. When is this surgery scheduled?  1. TBD  3. What type of clearance is required (medical clearance vs. Pharmacy clearance to hold med vs. Both)?  1. BOTH  4. Are there any medications that need to be held prior to surgery and how long? 1. DID NOT STATE  5. Practice name and name of physician performing surgery?  1. [facility]  6. What is the office phone number?  1. [phone]   7.   What is the office fax number?         1.  [phone]  8.   Anesthesia type (None, local, MAC, general) ?         1. GENERAL   Desma Paganini 09/04/2019, 11:54 AM  _________________________________________________________________   (provider comments below)

## 2019-09-04 NOTE — Telephone Encounter (Signed)
Patient has appointment to see Dr. Domenic Polite on 09/15/19. Clearance will be addressed at visit

## 2019-09-05 ENCOUNTER — Encounter: Payer: Self-pay | Admitting: Family Medicine

## 2019-09-05 DIAGNOSIS — R0902 Hypoxemia: Secondary | ICD-10-CM | POA: Insufficient documentation

## 2019-09-05 NOTE — Assessment & Plan Note (Signed)
Managed by nephrology,

## 2019-09-05 NOTE — Assessment & Plan Note (Signed)
  Patient re-educated about  the importance of commitment to a  minimum of 150 minutes of exercise per week as able.  The importance of healthy food choices with portion control discussed, as well as eating regularly and within a 12 hour window most days. The need to choose "clean , green" food 50 to 75% of the time is discussed, as well as to make water the primary drink and set a goal of 64 ounces water daily.    Weight /BMI 09/02/2019 08/17/2019 08/13/2019  WEIGHT 185 lb 190 lb 6.4 oz 188 lb  HEIGHT 5\' 3"  5' 2.992" 5\' 3"   BMI 32.77 kg/m2 33.74 kg/m2 33.3 kg/m2

## 2019-09-05 NOTE — Assessment & Plan Note (Signed)
Hypoxia withh minimal activity, pulse ox falls to 88% and folllowing 2l/min oxygen, pulse ox up to 97%. Needs and will benefit from supplemental oxygen

## 2019-09-05 NOTE — Assessment & Plan Note (Signed)
Diuretic refilled from dosing of most recent hospitalization. Pt was advised to contact her Nephrologist for appropriate dosing but did not follow-up. The dose from her hospitalization has been sent in however this is noted to be lower than that prescribed most recently by nephrology. She does have an upcoming appointment with oncology in the next 2 weeks.

## 2019-09-05 NOTE — Assessment & Plan Note (Signed)
Controlled, no change in medication DASH diet and commitment to daily physical activity for a minimum of 30 minutes discussed and encouraged, as a part of hypertension management. The importance of attaining a healthy weight is also discussed.  BP/Weight 09/03/2019 09/02/2019 08/17/2019 08/13/2019 07/28/2019 07/23/2019 8/82/8003  Systolic BP 491 791 505 - 697 948 -  Diastolic BP 71 70 68 - 57 53 -  Wt. (Lbs) - 185 190.4 188 184 - 192.9  BMI - 32.77 33.74 33.3 32.59 - 34.17

## 2019-09-05 NOTE — Assessment & Plan Note (Signed)
Treated by nephrologist on twice monthly Epogen.

## 2019-09-05 NOTE — Assessment & Plan Note (Signed)
Krista Strickland is reminded of the importance of commitment to daily physical activity for 30 minutes or more, as able and the need to limit carbohydrate intake to 30 to 60 grams per meal to help with blood sugar control.   The need to take medication as prescribed, test blood sugar as directed, and to call between visits if there is a concern that blood sugar is uncontrolled is also discussed.   Krista Strickland is reminded of the importance of daily foot exam, annual eye examination, and good blood sugar, blood pressure and cholesterol control. Controlled and managed by Endo  Diabetic Labs Latest Ref Rng & Units 07/28/2019 07/23/2019 07/22/2019 05/11/2019 03/11/2019  HbA1c 4.8 - 5.6 % - 7.5(H) - 7.8(A) 7.4(A)  Microalbumin Not Estab. ug/mL - - - - -  Micro/Creat Ratio 0.0 - 30.0 mg/g creat - - - - -  Chol <200 mg/dL - - - - -  HDL > OR = 50 mg/dL - - - - -  Calc LDL mg/dL (calc) - - - - -  Triglycerides <150 mg/dL - - - - -  Creatinine 0.50 - 0.99 mg/dL 2.80(H) 2.77(H) 3.05(H) - -  GFR >60.00 mL/min - - - - -   BP/Weight 09/03/2019 09/02/2019 08/17/2019 08/13/2019 07/28/2019 07/23/2019 0/78/6754  Systolic BP 492 010 071 - 219 758 -  Diastolic BP 71 70 68 - 57 53 -  Wt. (Lbs) - 185 190.4 188 184 - 192.9  BMI - 32.77 33.74 33.3 32.59 - 34.17   Foot/eye exam completion dates Latest Ref Rng & Units 08/17/2019 07/28/2019  Eye Exam No Retinopathy - -  Foot exam Order - - -  Foot Form Completion - Done Done

## 2019-09-15 ENCOUNTER — Ambulatory Visit: Payer: PPO | Admitting: Cardiology

## 2019-09-15 ENCOUNTER — Encounter: Payer: Self-pay | Admitting: Cardiology

## 2019-09-15 ENCOUNTER — Other Ambulatory Visit: Payer: Self-pay

## 2019-09-15 VITALS — BP 140/68 | HR 64 | Ht 63.0 in | Wt 178.0 lb

## 2019-09-15 DIAGNOSIS — N184 Chronic kidney disease, stage 4 (severe): Secondary | ICD-10-CM | POA: Diagnosis not present

## 2019-09-15 DIAGNOSIS — I422 Other hypertrophic cardiomyopathy: Secondary | ICD-10-CM | POA: Diagnosis not present

## 2019-09-15 NOTE — Progress Notes (Signed)
Cardiology Office Note  Date: 09/15/2019   ID: Krista Strickland, DOB 11/02/1950, MRN [id number]  PCP:  Fayrene Helper, MD  Cardiologist:  Rozann Lesches, MD Electrophysiologist:  None   Chief Complaint  Patient presents with  . Cardiac follow-up    History of Present Illness: Krista Strickland is a 69 y.o. female last seen in June.  She was sent to the [facility] ER at that time for further evaluation of progressive shortness of breath.  She was found to be severely anemic with hemoglobin of 6.4, also with acute on chronic renal insufficiency.  She was admitted to the hospitalist service, received 2 units of PRBCs, found to be Hemoccult negative.  It was felt that her anemia was more likely related to chronic kidney disease (recommended to start on Epogen with nephrologist).  Antihypertensive medications were held in the setting of low blood pressure, but she was kept on AV nodal blockers.  I see that she has had subsequent follow-up with Dr. Moshe Cipro her PCP, I reviewed the recent note.  She has been referred to Pulmonary for evaluation of hypoxic respiratory failure.  She is being considered for elective L4-5 lumbar fusion under general anesthesia.  In talking with her however, she has decided not to pursue surgery at this point, and I think that that is a good idea for now.  I talked with her about making sure that she keeps her follow-up with Pulmonary for evaluation.  She is scheduled for lab work this week with hematology and follow-up on hemoglobin having been on Epogen for the last few weeks.  We also discussed getting a follow-up echocardiogram in comparison to the study from December 2020.  She still has trouble with leg swelling, remains on diuretics.  She is also on ProAmatine.  Past Medical History:  Diagnosis Date  . Anemia   . Arthritis   . CKD (chronic kidney disease) stage 3, GFR 30-59 ml/min   . Diabetes mellitus, type 2 ([place])   . Essential hypertension   .  GERD (gastroesophageal reflux disease)   . Gout   . History of MRSA infection 03/2009  . History of stroke 2013  . Hypercalcemia 2017   Managed by nephrology  . Hyperlipidemia   . Obesity   . Psoriasis   . Uterine cancer ([place])   . 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 ABDOMINAL HYSTERECTOMY    . APPENDECTOMY  2012  . COLONOSCOPY WITH PROPOFOL N/A 08/24/2016   Procedure: COLONOSCOPY WITH PROPOFOL;  Surgeon: Rogene Houston, MD;  Location: [facility] END[facility] SU[facility];  Service: Endoscopy;  Laterality: N/A;  10:30  . Excison of right breast cyst    . LAPAROSCOPIC SALPINGO OOPHERECTOMY Right 04/22/2012   Procedure: LAPAROSCOPIC SALPINGO OOPHORECTOMY;  Surgeon: Jonnie Kind, MD;  Location: [facility] ORS;  Service: Gynecology;  Laterality: Right;  end 11:17  . MASS EXCISION N/A 04/22/2012   Procedure: EXCISION SKIN TAGS NECK AND HEAD;  Surgeon: Jonnie Kind, MD;  Location: [facility] ORS;  Service: Gynecology;  Laterality: N/A;  start 11:19  . PARTIAL HYSTERECTOMY    . POLYPECTOMY  08/24/2016   Procedure: POLYPECTOMY;  Surgeon: Rogene Houston, MD;  Location: [facility];  Service: Endoscopy;;  colon  . Right neck biopsy      Current Outpatient Medications  Medication Sig Dispense Refill  . allopurinol (ZYLOPRIM) 300 MG tablet Take 1 tablet by mouth once  daily (Patient taking differently: Take 300 mg by mouth every morning. ) 90 tablet 5  . aspirin 325 MG tablet Take 1 tablet (325 mg total) by mouth every morning. 30 tablet 0  . DILT-XR 180 MG 24 hr capsule Take 1 capsule by mouth once daily 90 capsule 0  . docusate sodium (COLACE) 100 MG capsule Take 100 mg by mouth daily as needed for mild constipation or moderate constipation.     Marland Kitchen epoetin alfa (EPOGEN) 3000 UNIT/ML injection Inject 3,000 Units into the vein every 14 (fourteen) days.    Marland Kitchen ezetimibe (ZETIA) 10 MG tablet Take 1 tablet by mouth once daily 90 tablet 0  . hydrALAZINE (APRESOLINE) 25 MG  tablet TAKE 1 TABLET BY MOUTH TWICE DAILY (AT  9:00  AM  AND  9:00  PM) 180 tablet 0  . HYDROcodone-acetaminophen (NORCO/VICODIN) 5-325 MG tablet One tablet every six hours for pain.  Limit 7 days. 28 tablet 0  . insulin NPH-regular Human (NOVOLIN 70/30 RELION) (70-30) 100 UNIT/ML injection Inject 100 Units into the skin daily with breakfast. 40 mL 11  . metolazone (ZAROXOLYN) 5 MG tablet Take 1 tablet by mouth once daily (Patient taking differently: Take 5 mg by mouth daily. ) 90 tablet 0  . metoprolol tartrate (LOPRESSOR) 50 MG tablet Take 1 tablet (50 mg total) by mouth 2 (two) times daily. 30 tablet 1  . midodrine (PROAMATINE) 5 MG tablet Take 5 mg by mouth 3 (three) times daily.    . potassium chloride (KLOR-CON) 10 MEQ tablet TAKE 3 TABLETS BY MOUTH THREE TIMES DAILY 270 tablet 1  . pravastatin (PRAVACHOL) 20 MG tablet TAKE 1 TABLET BY MOUTH ONCE DAILY BEFORE BREAKFAST (Patient taking differently: Take 20 mg by mouth every morning. ) 90 tablet 0  . torsemide (DEMADEX) 20 MG tablet Take 2 tablets (40 mg total) by mouth 2 (two) times daily. 56 tablet 0   No current facility-administered medications for this visit.   Allergies:  Benazepril, Metronidazole, Mobic [meloxicam], Penicillins, and Sulfonamide derivatives   ROS:  No palpitations or syncope.  Physical Exam: VS:  BP 140/68   Pulse 64   Ht 5\' 3"  (1.6 m)   Wt 178 lb (80.7 kg)   SpO2 98%   BMI 31.53 kg/m , BMI Body mass index is 31.53 kg/m.  Wt Readings from Last 3 Encounters:  09/15/19 178 lb (80.7 kg)  09/02/19 185 lb (83.9 kg)  08/17/19 190 lb 6.4 oz (86.4 kg)    General: Patient appears comfortable at rest. HEENT: Conjunctiva and lids normal, wearing a mask. Neck: Supple, no elevated JVP or carotid bruits, no thyromegaly. Lungs: Clear to auscultation, nonlabored breathing at rest. Cardiac: Regular rate and rhythm, no S3, 2/6 systolic murmur, no pericardial rub. Abdomen: Soft, nontender, bowel sounds  present. Extremities: Chronic appearing lower leg and foot swelling/lymphedema..  ECG:  An ECG dated 07/22/2019 was personally reviewed today and demonstrated:  Sinus bradycardia with prolonged PR interval and IVCD.  Recent Labwork: 07/22/2019: ALT 20; AST 19; B Natriuretic Peptide 423.0; TSH 1.411 07/28/2019: BUN 134; Creat 2.80; Platelets 233; Potassium 3.8; Sodium 127 09/03/2019: Hemoglobin 8.4     Component Value Date/Time   CHOL 155 02/11/2019 1026   CHOL 117 09/07/2017 0930   TRIG 150 (H) 02/11/2019 1026   HDL 32 (L) 02/11/2019 1026   HDL 24 (L) 09/07/2017 0930   CHOLHDL 4.8 02/11/2019 1026   VLDL 55 (H) 06/01/2016 1048   LDLCALC 98 02/11/2019 1026  Other Studies Reviewed Today:  Echocardiogram 01/16/2019: 1. Left ventricular ejection fraction, by visual estimation, is 70 to  75%. The left ventricle has hyperdynamic function. There is severely  increased left ventricular hypertrophy (septal greater than posterior)  consistent with hypertrophic  cardiomyopathy. Mildly increased LVOT gradient, increases with Valsalva  also consistent with HOCM (gradient not measured).  2. Elevated left atrial pressure.  3. Left ventricular diastolic parameters are consistent with Grade I  diastolic dysfunction (impaired relaxation).  4. The left ventricle has no regional wall motion abnormalities.  5. Global right ventricle has normal systolic function.The right  ventricular size is normal. No increase in right ventricular wall  thickness.  6. Left atrial size was moderately dilated.  7. Right atrial size was normal.  8. Presence of pericardial fat pad.  9. Moderate mitral annular calcification.  10. The mitral valve is degenerative. Trivial mitral valve regurgitation.  11. The tricuspid valve is grossly normal. Tricuspid valve regurgitation  is trivial.  12. The aortic valve is tricuspid. Aortic valve regurgitation is not  visualized.  13. The pulmonic valve was grossly normal.  Pulmonic valve regurgitation is  trivial.  14. TR signal is inadequate for assessing pulmonary artery systolic  pressure.  15. The inferior vena cava is normal in size with greater than 50%  respiratory variability, suggesting right atrial pressure of 3 mmHg.   Assessment and Plan:  1.  Symptomatic anemia with progressive shortness of breath, hemoglobin down to 6.4 as discussed above, improved after PRBC transfusions, and now on Epogen with follow-up by hematology.  Stools were guaiac negative.  She has chronic kidney disease which is likely related.  Follow-up lab work pending for later this week.  Keep follow-up with Dr. Moshe Cipro.  2.  History of hypertrophic obstructive cardiomyopathy, last echocardiogram was in December 2020.  Updated study will be obtained.  We are clarifying her medical regimen with pharmacy, she remains on AV nodal blockers and has adequate heart rate control today.  No syncope.  3.  CKD stage IIIb-IV.  She continues to follow with nephrology.  Medication Adjustments/Labs and Tests Ordered: Current medicines are reviewed at length with the patient today.  Concerns regarding medicines are outlined above.   Tests Ordered: Orders Placed This Encounter  Procedures  . ECHOCARDIOGRAM COMPLETE    Medication Changes: No orders of the defined types were placed in this encounter.   Disposition:  Follow up 6 weeks in the [place] office.  Signed, Satira Sark, MD, [facility] 09/15/2019 3:06 PM    [place] [facility] [address], [place], [place] [address] Phone: [phone]; Fax: [phone]

## 2019-09-15 NOTE — Patient Instructions (Signed)
Medication Instructions:  Your physician recommends that you continue on your current medications as directed. Please refer to the Current Medication list given to you today.  *If you need a refill on your cardiac medications before your next appointment, please call your pharmacy*   Lab Work: None today If you have labs (blood work) drawn today and your tests are completely normal, you will receive your results only by: Marland Kitchen MyChart Message (if you have MyChart) OR . A paper copy in the mail If you have any lab test that is abnormal or we need to change your treatment, we will call you to review the results.   Testing/Procedures: Your physician has requested that you have an echocardiogram. Echocardiography is a painless test that uses sound waves to create images of your heart. It provides your doctor with information about the size and shape of your heart and how well your heart's chambers and valves are working. This procedure takes approximately one hour. There are no restrictions for this procedure.     Follow-Up: At [facility], you and your health needs are our priority.  As part of our continuing mission to provide you with exceptional heart care, we have created designated Provider Care Teams.  These Care Teams include your primary Cardiologist (physician) and Advanced Practice Providers (APPs -  Physician Assistants and Nurse Practitioners) who all work together to provide you with the care you need, when you need it.  We recommend signing up for the patient portal called "MyChart".  Sign up information is provided on this After Visit Summary.  MyChart is used to connect with patients for Virtual Visits (Telemedicine).  Patients are able to view lab/test results, encounter notes, upcoming appointments, etc.  Non-urgent messages can be sent to your provider as well.   To learn more about what you can do with MyChart, go to [email or web address].    Your next appointment:   6  week(s)  The format for your next appointment:   In Person  Provider:   Rozann Lesches, MD or Bernerd Pho, PA-C   Other Instructions None       Thank you for choosing [facility] !

## 2019-09-16 ENCOUNTER — Other Ambulatory Visit: Payer: Self-pay | Admitting: Orthopaedic Surgery

## 2019-09-16 ENCOUNTER — Other Ambulatory Visit: Payer: Self-pay | Admitting: Family Medicine

## 2019-09-17 ENCOUNTER — Encounter (HOSPITAL_COMMUNITY)
Admission: RE | Admit: 2019-09-17 | Discharge: 2019-09-17 | Disposition: A | Discharge status: Home / Self Care | Payer: PPO | Source: Ambulatory Visit | Attending: Nephrology | Admitting: Nephrology

## 2019-09-17 ENCOUNTER — Encounter (HOSPITAL_COMMUNITY): Payer: Self-pay

## 2019-09-17 ENCOUNTER — Other Ambulatory Visit: Payer: Self-pay

## 2019-09-17 DIAGNOSIS — N189 Chronic kidney disease, unspecified: Secondary | ICD-10-CM | POA: Diagnosis not present

## 2019-09-17 DIAGNOSIS — N184 Chronic kidney disease, stage 4 (severe): Secondary | ICD-10-CM | POA: Diagnosis not present

## 2019-09-17 DIAGNOSIS — E1122 Type 2 diabetes mellitus with diabetic chronic kidney disease: Secondary | ICD-10-CM | POA: Diagnosis not present

## 2019-09-17 DIAGNOSIS — E1129 Type 2 diabetes mellitus with other diabetic kidney complication: Secondary | ICD-10-CM | POA: Diagnosis not present

## 2019-09-17 DIAGNOSIS — R809 Proteinuria, unspecified: Secondary | ICD-10-CM | POA: Diagnosis not present

## 2019-09-17 DIAGNOSIS — I129 Hypertensive chronic kidney disease with stage 1 through stage 4 chronic kidney disease, or unspecified chronic kidney disease: Secondary | ICD-10-CM | POA: Diagnosis not present

## 2019-09-17 DIAGNOSIS — E211 Secondary hyperparathyroidism, not elsewhere classified: Secondary | ICD-10-CM | POA: Diagnosis not present

## 2019-09-17 DIAGNOSIS — E876 Hypokalemia: Secondary | ICD-10-CM | POA: Diagnosis not present

## 2019-09-17 DIAGNOSIS — I422 Other hypertrophic cardiomyopathy: Secondary | ICD-10-CM | POA: Diagnosis not present

## 2019-09-17 LAB — CBC
HCT: 33 % — ABNORMAL LOW (ref 36.0–46.0)
Hemoglobin: 10.3 g/dL — ABNORMAL LOW (ref 12.0–15.0)
MCH: 29 pg (ref 26.0–34.0)
MCHC: 31.2 g/dL (ref 30.0–36.0)
MCV: 93 fL (ref 80.0–100.0)
Platelets: 282 10*3/uL (ref 150–400)
RBC: 3.55 MIL/uL — ABNORMAL LOW (ref 3.87–5.11)
RDW: 18.9 % — ABNORMAL HIGH (ref 11.5–15.5)
WBC: 7.1 10*3/uL (ref 4.0–10.5)
nRBC: 0 % (ref 0.0–0.2)

## 2019-09-17 LAB — IRON AND TIBC
Iron: 65 ug/dL (ref 28–170)
Saturation Ratios: 21 % (ref 10.4–31.8)
TIBC: 314 ug/dL (ref 250–450)
UIBC: 249 ug/dL

## 2019-09-17 LAB — RENAL FUNCTION PANEL
Albumin: 3.7 g/dL (ref 3.5–5.0)
Anion gap: 13 (ref 5–15)
BUN: 85 mg/dL — ABNORMAL HIGH (ref 8–23)
CO2: 26 mmol/L (ref 22–32)
Calcium: 9.8 mg/dL (ref 8.9–10.3)
Chloride: 94 mmol/L — ABNORMAL LOW (ref 98–111)
Creatinine, Ser: 2.1 mg/dL — ABNORMAL HIGH (ref 0.44–1.00)
GFR calc Af Amer: 27 mL/min — ABNORMAL LOW (ref >60)
GFR calc non Af Amer: 23 mL/min — ABNORMAL LOW (ref >60)
Glucose, Bld: 197 mg/dL — ABNORMAL HIGH (ref 70–99)
Phosphorus: 3.4 mg/dL (ref 2.5–4.6)
Potassium: 3.1 mmol/L — ABNORMAL LOW (ref 3.5–5.1)
Sodium: 133 mmol/L — ABNORMAL LOW (ref 135–145)

## 2019-09-17 LAB — VITAMIN D 25 HYDROXY (VIT D DEFICIENCY, FRACTURES): Vit D, 25-Hydroxy: 23.21 ng/mL — ABNORMAL LOW (ref 30–100)

## 2019-09-17 LAB — POCT HEMOGLOBIN-HEMACUE: Hemoglobin: 10.1 g/dL — ABNORMAL LOW (ref 12.0–15.0)

## 2019-09-17 MED ORDER — EPOETIN ALFA 3000 UNIT/ML IJ SOLN: 3000.0000 [IU] | Freq: Once | INTRAMUSCULAR | Status: DC

## 2019-09-18 ENCOUNTER — Ambulatory Visit (HOSPITAL_COMMUNITY)
Admission: RE | Admit: 2019-09-18 | Discharge: 2019-09-18 | Disposition: A | Discharge status: Home / Self Care | Payer: PPO | Source: Ambulatory Visit | Attending: Cardiology | Admitting: Cardiology

## 2019-09-18 DIAGNOSIS — I422 Other hypertrophic cardiomyopathy: Secondary | ICD-10-CM | POA: Diagnosis not present

## 2019-09-18 LAB — PARATHYROID HORMONE, INTACT (NO CA): PTH: 63 pg/mL (ref 15–65)

## 2019-09-18 LAB — ECHOCARDIOGRAM COMPLETE
Area-P 1/2: 1.28 cm2
S' Lateral: 2.42 cm

## 2019-09-18 NOTE — Progress Notes (Signed)
*  PRELIMINARY RESULTS* Echocardiogram 2D Echocardiogram has been performed.  Krista Strickland 09/18/2019, 11:27 AM

## 2019-09-23 ENCOUNTER — Other Ambulatory Visit: Payer: Self-pay

## 2019-09-23 ENCOUNTER — Encounter (HOSPITAL_COMMUNITY): Payer: Self-pay | Admitting: *Deleted

## 2019-09-23 ENCOUNTER — Emergency Department (HOSPITAL_COMMUNITY): Payer: PPO

## 2019-09-23 ENCOUNTER — Emergency Department (HOSPITAL_COMMUNITY)
Admission: EM | Admit: 2019-09-23 | Discharge: 2019-09-23 | Disposition: A | Discharge status: Home / Self Care | Payer: PPO | Attending: Emergency Medicine | Admitting: Emergency Medicine

## 2019-09-23 DIAGNOSIS — Y999 Unspecified external cause status: Secondary | ICD-10-CM | POA: Insufficient documentation

## 2019-09-23 DIAGNOSIS — M25512 Pain in left shoulder: Secondary | ICD-10-CM | POA: Insufficient documentation

## 2019-09-23 DIAGNOSIS — Z8673 Personal history of transient ischemic attack (TIA), and cerebral infarction without residual deficits: Secondary | ICD-10-CM | POA: Insufficient documentation

## 2019-09-23 DIAGNOSIS — I129 Hypertensive chronic kidney disease with stage 1 through stage 4 chronic kidney disease, or unspecified chronic kidney disease: Secondary | ICD-10-CM | POA: Diagnosis not present

## 2019-09-23 DIAGNOSIS — E1140 Type 2 diabetes mellitus with diabetic neuropathy, unspecified: Secondary | ICD-10-CM | POA: Diagnosis not present

## 2019-09-23 DIAGNOSIS — Z794 Long term (current) use of insulin: Secondary | ICD-10-CM | POA: Diagnosis not present

## 2019-09-23 DIAGNOSIS — E1122 Type 2 diabetes mellitus with diabetic chronic kidney disease: Secondary | ICD-10-CM | POA: Insufficient documentation

## 2019-09-23 DIAGNOSIS — Y939 Activity, unspecified: Secondary | ICD-10-CM | POA: Insufficient documentation

## 2019-09-23 DIAGNOSIS — Z87891 Personal history of nicotine dependence: Secondary | ICD-10-CM | POA: Diagnosis not present

## 2019-09-23 DIAGNOSIS — Y92002 Bathroom of unspecified non-institutional (private) residence single-family (private) house as the place of occurrence of the external cause: Secondary | ICD-10-CM | POA: Diagnosis not present

## 2019-09-23 DIAGNOSIS — Z8542 Personal history of malignant neoplasm of other parts of uterus: Secondary | ICD-10-CM | POA: Insufficient documentation

## 2019-09-23 DIAGNOSIS — M549 Dorsalgia, unspecified: Secondary | ICD-10-CM | POA: Diagnosis not present

## 2019-09-23 DIAGNOSIS — Z7982 Long term (current) use of aspirin: Secondary | ICD-10-CM | POA: Insufficient documentation

## 2019-09-23 DIAGNOSIS — R0789 Other chest pain: Secondary | ICD-10-CM | POA: Diagnosis not present

## 2019-09-23 DIAGNOSIS — M25552 Pain in left hip: Secondary | ICD-10-CM | POA: Diagnosis not present

## 2019-09-23 DIAGNOSIS — Z79899 Other long term (current) drug therapy: Secondary | ICD-10-CM | POA: Insufficient documentation

## 2019-09-23 DIAGNOSIS — N184 Chronic kidney disease, stage 4 (severe): Secondary | ICD-10-CM | POA: Insufficient documentation

## 2019-09-23 DIAGNOSIS — M545 Low back pain: Secondary | ICD-10-CM | POA: Insufficient documentation

## 2019-09-23 DIAGNOSIS — E876 Hypokalemia: Secondary | ICD-10-CM

## 2019-09-23 DIAGNOSIS — N189 Chronic kidney disease, unspecified: Secondary | ICD-10-CM | POA: Diagnosis not present

## 2019-09-23 DIAGNOSIS — W19XXXA Unspecified fall, initial encounter: Secondary | ICD-10-CM | POA: Insufficient documentation

## 2019-09-23 DIAGNOSIS — Z20822 Contact with and (suspected) exposure to covid-19: Secondary | ICD-10-CM | POA: Diagnosis not present

## 2019-09-23 LAB — CBC WITH DIFFERENTIAL/PLATELET
Abs Immature Granulocytes: 0.02 10*3/uL (ref 0.00–0.07)
Basophils Absolute: 0.1 10*3/uL (ref 0.0–0.1)
Basophils Relative: 1 %
Eosinophils Absolute: 0.1 10*3/uL (ref 0.0–0.5)
Eosinophils Relative: 1 %
HCT: 34.8 % — ABNORMAL LOW (ref 36.0–46.0)
Hemoglobin: 10.7 g/dL — ABNORMAL LOW (ref 12.0–15.0)
Immature Granulocytes: 0 %
Lymphocytes Relative: 25 %
Lymphs Abs: 2.2 10*3/uL (ref 0.7–4.0)
MCH: 28.8 pg (ref 26.0–34.0)
MCHC: 30.7 g/dL (ref 30.0–36.0)
MCV: 93.8 fL (ref 80.0–100.0)
Monocytes Absolute: 0.8 10*3/uL (ref 0.1–1.0)
Monocytes Relative: 9 %
Neutro Abs: 5.4 10*3/uL (ref 1.7–7.7)
Neutrophils Relative %: 64 %
Platelets: 243 10*3/uL (ref 150–400)
RBC: 3.71 MIL/uL — ABNORMAL LOW (ref 3.87–5.11)
RDW: 18.6 % — ABNORMAL HIGH (ref 11.5–15.5)
WBC: 8.6 10*3/uL (ref 4.0–10.5)
nRBC: 0 % (ref 0.0–0.2)

## 2019-09-23 LAB — BASIC METABOLIC PANEL
Anion gap: 13 (ref 5–15)
BUN: 67 mg/dL — ABNORMAL HIGH (ref 8–23)
CO2: 28 mmol/L (ref 22–32)
Calcium: 10.1 mg/dL (ref 8.9–10.3)
Chloride: 94 mmol/L — ABNORMAL LOW (ref 98–111)
Creatinine, Ser: 1.85 mg/dL — ABNORMAL HIGH (ref 0.44–1.00)
GFR calc Af Amer: 32 mL/min — ABNORMAL LOW (ref >60)
GFR calc non Af Amer: 27 mL/min — ABNORMAL LOW (ref >60)
Glucose, Bld: 89 mg/dL (ref 70–99)
Potassium: 2.8 mmol/L — ABNORMAL LOW (ref 3.5–5.1)
Sodium: 135 mmol/L (ref 135–145)

## 2019-09-23 LAB — TROPONIN I (HIGH SENSITIVITY)
Troponin I (High Sensitivity): 90 ng/L — ABNORMAL HIGH (ref <18)
Troponin I (High Sensitivity): 88 ng/L — ABNORMAL HIGH (ref <18)

## 2019-09-23 LAB — SARS CORONAVIRUS 2 BY RT PCR (HOSPITAL ORDER, PERFORMED IN ~~LOC~~ HOSPITAL LAB): SARS Coronavirus 2: NEGATIVE

## 2019-09-23 LAB — MAGNESIUM: Magnesium: 1.7 mg/dL (ref 1.7–2.4)

## 2019-09-23 MED ORDER — POTASSIUM CHLORIDE CRYS ER 20 MEQ PO TBCR
40.0000 meq | EXTENDED_RELEASE_TABLET | Freq: Once | ORAL | Status: AC
  Administered 2019-09-23: 40 meq via ORAL
  Filled 2019-09-23: qty 2

## 2019-09-23 MED ORDER — POTASSIUM CHLORIDE 10 MEQ/100ML IV SOLN
10.0000 meq | Freq: Once | INTRAVENOUS | Status: AC
  Administered 2019-09-23: 10 meq via INTRAVENOUS
  Filled 2019-09-23: qty 100

## 2019-09-23 NOTE — ED Provider Notes (Signed)
[facility] EMERGENCY DEPARTMENT Provider Note   CSN: [id number] Arrival date & time: 09/23/19  1043     History Chief Complaint  Patient presents with   Krista Strickland is a 69 y.o. female.  HPI   Patient with significant medical history of anemia, CKD, diabetes, hypertension, GERD, hypertrophic cardiomyopthy,  obesity presents to the emergency department with chief complaint of lower back pain, left shoulder pain and left hip pain after she suffered a fall last night.  Patient states she got up from bed and went to use the restroom she states she felt off balance and fell to the floor.  She is unsure exactly how she fell but states she fell on her left side.  She denies hitting her head, losing conscious, being on anticoag.  She yelled for her husband to come pick her up and get her off the floor.  She has had falls in the past but states she is unsure exactly why she fell today, she thinks she was off balance.  She denies becoming lightheaded, dizzy, becoming short of breath, chest pain, diaphoretic or weakness in her legs.  She denies any alleviating or aggravating factors.  Patient denies headache, fever, chills, shortness of breath, chest pain, abdominal pain, dysuria, pedal edema.  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    Patient Active Problem List   Diagnosis Date Noted   Hypoxia 09/05/2019   Sleep-disordered breathing 09/02/2019   Type 2 diabetes mellitus with diabetic chronic kidney disease ([place]) 08/17/2019   Symptomatic anemia 81/01/7508     Diastolic dysfunction 25/85/2778   Anemia in chronic kidney disease 12/18/2018   Hypercalcemia 12/18/2018   Proteinuria 12/18/2018   Secondary hyperparathyroidism ([place]) 12/18/2018   Vitamin D deficiency 12/18/2018   CKD (chronic kidney disease) stage 4, GFR 15-29 ml/min ([place]) 12/18/2018   Hypertensive retinopathy of both eyes 11/14/2018   Nuclear sclerotic cataract of both eyes 11/14/2018   Iritis of left eye 11/14/2018   Diabetic neuropathy ([place]) 11/09/2015   Leg edema 11/30/2014   Multinodular goiter (nontoxic) 09/20/2013   Hypertriglyceridemia 08/05/2013   Back pain 04/16/2013   CVA (cerebral vascular accident) ([place]) 07/23/2011   SLEEP APNEA 09/06/2009   Hyperuricemia 11/04/2008   Hyperlipidemia LDL goal <100 07/02/2007   Obesity (BMI 30.0-34.9) 07/02/2007   Essential hypertension 07/02/2007    Past Surgical History:  Procedure Laterality Date   ABDOMINAL HYSTERECTOMY     APPENDECTOMY  2012   COLONOSCOPY WITH PROPOFOL N/A 08/24/2016   Procedure: COLONOSCOPY WITH PROPOFOL;  Surgeon: Rogene Houston, MD;  Location: [facility] ENDO SU[facility];  Service: Endoscopy;  Laterality: N/A;  10:30   Excison of right breast cyst     LAPAROSCOPIC SALPINGO OOPHERECTOMY Right 04/22/2012   Procedure: LAPAROSCOPIC SALPINGO OOPHORECTOMY;  Surgeon: Jonnie Kind, MD;  Location: [facility] ORS;  Service: Gynecology;  Laterality: Right;  end 11:17   MASS EXCISION N/A 04/22/2012   Procedure: EXCISION SKIN TAGS NECK  AND HEAD;  Surgeon: Jonnie Kind, MD;  Location: [facility] ORS;  Service: Gynecology;  Laterality: N/A;  start 11:19   PARTIAL HYSTERECTOMY     POLYPECTOMY  08/24/2016   Procedure: POLYPECTOMY;  Surgeon: Rogene Houston, MD;  Location: [facility] END[facility] SU[facility];  Service: Endoscopy;;  colon   Right neck biopsy       OB History    Gravida  1   Para  1   Term      Preterm      AB      Living        SAB      TAB      Ectopic      Multiple      Live Births               Family History  Problem Relation Age of Onset   Hypertension Brother    Gout Brother    Prostate cancer Brother    Hypertension Brother    Prostate cancer Brother    Gout Brother    Hypertension Sister    Gout Sister    Leukemia Sister 59   Pancreatic cancer Sister 97   Gout Sister    Prostate cancer Brother    Diabetes Neg Hx     Social History   Tobacco Use   Smoking status: Former Smoker    Packs/day: 0.25    Years: 12.00    Pack years: 3.00    Quit date: 07/16/1978    Years since quitting: 41.2   Smokeless tobacco: Never Used  Vaping Use   Vaping Use: Never used  Substance Use Topics   Alcohol use: No   Drug use: No    Home Medications Prior to Admission medications   Medication Sig Start Date End Date Taking? Authorizing Provider  allopurinol (ZYLOPRIM) 300 MG tablet Take 1 tablet by mouth once daily 09/17/19  Yes Sanjuana Kava, MD  aspirin 325 MG tablet Take 1 tablet (325 mg total) by mouth every morning. 07/25/19  Yes Shahmehdi, Valeria Batman, MD  DILT-XR 180 MG 24 hr capsule Take 1 capsule by mouth once daily 08/26/19  Yes Fayrene Helper, MD  docusate sodium (COLACE) 100 MG capsule Take 100 mg by mouth daily as needed for mild constipation or moderate constipation.    Yes [provider]  epoetin alfa (EPOGEN) 3000 UNIT/ML injection Inject 3,000 Units into the vein every 14 (fourteen) days.   Yes Bhutani, Manpreet S, MD  ezetimibe (ZETIA) 10 MG tablet Take 1 tablet by mouth once daily 08/26/19  Yes Fayrene Helper, MD  HYDROcodone-acetaminophen (NORCO/VICODIN) 5-325 MG tablet One tablet every six hours for pain.  Limit 7 days. 08/13/19  Yes Sanjuana Kava, MD  insulin NPH-regular Human (NOVOLIN 70/30 RELION) (70-30) 100 UNIT/ML injection Inject 100 Units into the skin daily with breakfast. 08/17/19  Yes Renato Shin, MD  metolazone (ZAROXOLYN) 5 MG tablet Take 1 tablet by mouth once daily 09/16/19  Yes Perlie Mayo, NP  metoprolol  tartrate (LOPRESSOR) 50 MG tablet Take 1 tablet (50 mg total) by mouth 2 (two) times daily. 07/23/19  Yes Shahmehdi, Seyed A, MD  midodrine (PROAMATINE) 5 MG tablet Take 5 mg by mouth 3 (three) times daily. 03/10/19  Yes [provider]  potassium chloride (KLOR-CON) 10 MEQ tablet TAKE 3 TABLETS BY MOUTH THREE TIMES DAILY 08/10/19  Yes Fayrene Helper, MD  pravastatin (PRAVACHOL) 20 MG tablet TAKE 1 TABLET BY MOUTH ONCE  DAILY BEFORE BREAKFAST Patient taking differently: Take 20 mg by mouth every morning.  06/30/19  Yes Fayrene Helper, MD  torsemide (DEMADEX) 20 MG tablet Take 2 tablets (40 mg total) by mouth 2 (two) times daily. 09/03/19  Yes Fayrene Helper, MD  hydrALAZINE (APRESOLINE) 25 MG tablet TAKE 1 TABLET BY MOUTH TWICE DAILY (AT  9:00  AM  AND  9:00  PM) 08/14/19   Fayrene Helper, MD    Allergies    Benazepril, Metronidazole, Mobic [meloxicam], Penicillins, and Sulfonamide derivatives  Review of Systems   Review of Systems  Constitutional: Negative for chills and fever.  HENT: Negative for congestion, sore throat and tinnitus.   Eyes: Negative for visual disturbance.  Respiratory: Negative for cough and shortness of breath.   Cardiovascular: Negative for chest pain and palpitations.  Gastrointestinal: Negative for abdominal pain, diarrhea, nausea and vomiting.  Genitourinary: Negative for dysuria, enuresis, flank pain and vaginal bleeding.  Musculoskeletal: Positive for back pain. Negative for joint swelling.       Admits to left shoulder pain, left hip pain, lower back pain.  Skin: Negative for rash.  Neurological: Negative for dizziness.       Fell off balance.  Hematological: Does not bruise/bleed easily.    Physical Exam Updated Vital Signs BP 140/77 (BP Location: Left Arm)    Pulse 65    Temp 98 F (36.7 C) (Oral)    Resp 20    Ht 5\' 3"  (1.6 m)    Wt 80.7 kg    SpO2 97%    BMI 31.53 kg/m   Physical Exam Vitals and nursing note reviewed.    Constitutional:      General: She is not in acute distress.    Appearance: Normal appearance. She is not ill-appearing or diaphoretic.  HENT:     Head: Normocephalic and atraumatic.     Nose: No congestion or rhinorrhea.     Mouth/Throat:     Mouth: Mucous membranes are moist.     Pharynx: Oropharynx is clear.  Eyes:     General: No visual field deficit or scleral icterus.    Conjunctiva/sclera: Conjunctivae normal.     Pupils: Pupils are equal, round, and reactive to light.  Cardiovascular:     Rate and Rhythm: Normal rate and regular rhythm.     Pulses: Normal pulses.     Heart sounds: No murmur heard.  No friction rub. No gallop.   Pulmonary:     Effort: Pulmonary effort is normal. No respiratory distress.     Breath sounds: No wheezing, rhonchi or rales.  Abdominal:     General: There is no distension.     Palpations: Abdomen is soft.     Tenderness: There is no abdominal tenderness. There is no right CVA tenderness, left CVA tenderness or guarding.  Musculoskeletal:        General: No swelling or tenderness.     Comments: Spine was visualized, no gross abnormalities noted, tender to palpation along her lumbar spine no step-off noted.  Left shoulder was visualized, no gross abnormalities noted, she had decreased flexion due to pain, 4 out of 5 strength, neurovascular fully intact.  Left hip pain upon palpation, no gross abnormalities noted, no leg shortening or inversion or extrusion of left leg, full range of motion, 5 out of 5 strength, neurovascular fully intact.  Skin:    General: Skin is warm and dry.     Capillary Refill: Capillary refill takes less than  2 seconds.     Findings: No rash.  Neurological:     General: No focal deficit present.     Mental Status: She is alert and oriented to person, place, and time.     GCS: GCS eye subscore is 4. GCS verbal subscore is 5. GCS motor subscore is 6.     Cranial Nerves: Cranial nerves are intact. No cranial nerve deficit  or facial asymmetry.     Sensory: Sensation is intact. No sensory deficit.     Motor: Motor function is intact. No weakness or pronator drift.     Coordination: Coordination is intact. Romberg sign negative. Finger-Nose-Finger Test and Heel to [facility] Test normal.  Psychiatric:        Mood and Affect: Mood normal.     ED Results / Procedures / Treatments   Labs (all labs ordered are listed, but only abnormal results are displayed) Labs Reviewed  CBC WITH DIFFERENTIAL/PLATELET - Abnormal; Notable for the following components:      Result Value   RBC 3.71 (*)    Hemoglobin 10.7 (*)    HCT 34.8 (*)    RDW 18.6 (*)    All other components within normal limits  BASIC METABOLIC PANEL - Abnormal; Notable for the following components:   Potassium 2.8 (*)    Chloride 94 (*)    BUN 67 (*)    Creatinine, Ser 1.85 (*)    GFR calc non Af Amer 27 (*)    GFR calc Af Amer 32 (*)    All other components within normal limits  TROPONIN I (HIGH SENSITIVITY) - Abnormal; Notable for the following components:   Troponin I (High Sensitivity) 90 (*)    All other components within normal limits  TROPONIN I (HIGH SENSITIVITY) - Abnormal; Notable for the following components:   Troponin I (High Sensitivity) 88 (*)    All other components within normal limits  SARS CORONAVIRUS 2 BY RT PCR (HOSPITAL ORDER, [facility])  MAGNESIUM    EKG None  Radiology DG Chest 2 View  Result Date: 09/23/2019 CLINICAL DATA:  Fall, left-sided pain. EXAM: CHEST - 2 VIEW COMPARISON:  07/22/2019 FINDINGS: The heart size and mediastinal contours are within normal limits. Aortic atherosclerosis. Both lungs are clear. No pleural effusion. No pneumothorax. No evidence of acute displaced fracture. IMPRESSION: No acute cardiopulmonary disease. Electronically Signed   By: Margaretha Sheffield MD   On: 09/23/2019 13:11   CT Lumbar Spine Wo Contrast  Result Date: 09/23/2019 CLINICAL DATA:  Low back pain with  left leg pain.  Fall last night. EXAM: CT LUMBAR SPINE WITHOUT CONTRAST TECHNIQUE: Multidetector CT imaging of the lumbar spine was performed without intravenous contrast administration. Multiplanar CT image reconstructions were also generated. COMPARISON:  CT myelogram lumbar spine 06/17/2015. MRI lumbar 01/04/2019 FINDINGS: Segmentation: Normal Alignment: Mild anterolisthesis L4-5 otherwise normal alignment. Vertebrae: Negative for fracture or mass. Paraspinal and other soft tissues: Atherosclerotic disease with calcification. Negative for aortic aneurysm. No paraspinous mass or adenopathy. Disc levels: T12-L1: Mild disc and mild facet degeneration. Negative for stenosis L1-2: Diffuse disc bulging and bilateral facet degeneration. Mild spinal stenosis and mild subarticular stenosis bilaterally. L2-3: Disc degeneration with diffuse disc bulging and bilateral facet degeneration. Mild spinal stenosis. L3-4: Diffuse disc bulging and moderate facet degeneration. Mild spinal stenosis and mild subarticular stenosis bilaterally. L4-5: Mild anterolisthesis with severe facet degeneration. Disc degeneration with diffuse bulging of the disc. Moderate spinal stenosis. Severe subarticular stenosis on the  left and moderate subarticular stenosis on the right. No change from the prior MRI. L5-S1: Advanced facet degeneration bilaterally. Mild disc bulging and mild subarticular stenosis bilaterally. IMPRESSION: 1. Mild spinal stenosis L1-2, L2-3, L3-4 due to disc and facet degeneration 2. Moderate spinal stenosis L4-5 unchanged from the prior study. Severe subarticular stenosis on the left and moderate subarticular stenosis on the right also unchanged. Electronically Signed   By: Franchot Gallo M.D.   On: 09/23/2019 13:34   DG Shoulder Left  Result Date: 09/23/2019 CLINICAL DATA:  Left shoulder pain after fall last night. EXAM: LEFT SHOULDER - 2+ VIEW COMPARISON:  None. FINDINGS: No acute fracture or dislocation. Moderate  acromioclavicular joint space narrowing. The glenohumeral joint space is preserved. Prominent subacromial spurring. Small subcortical cyst in the humeral head. Bone mineralization is normal. Soft tissues are unremarkable. IMPRESSION: 1. No acute osseous abnormality. 2. Moderate acromioclavicular osteoarthritis. Electronically Signed   By: Titus Dubin M.D.   On: 09/23/2019 13:10   DG Hip Unilat W or Wo Pelvis 2-3 Views Left  Result Date: 09/23/2019 CLINICAL DATA:  Fall with left hip pain. EXAM: DG HIP (WITH OR WITHOUT PELVIS) 2-3V LEFT COMPARISON:  None. FINDINGS: There is no evidence of hip fracture or dislocation. Moderate bilateral hip degenerative change. Moderate pubic symphysis degenerative change. Lower lumbar degenerative change, partially imaged. IMPRESSION: Negative. Electronically Signed   By: Margaretha Sheffield MD   On: 09/23/2019 13:09    Procedures Procedures (including critical care time)  Medications Ordered in ED Medications  potassium chloride 10 mEq in 100 mL IVPB (0 mEq Intravenous Stopped 09/23/19 1732)  potassium chloride SA (KLOR-CON) CR tablet 40 mEq (40 mEq Oral Given 09/23/19 1546)    ED Course  I have reviewed the triage vital signs and the nursing notes.  Pertinent labs & imaging results that were available during my care of the patient were reviewed by me and considered in my medical decision making (see chart for details).    MDM Rules/Calculators/A&P                          I have personally reviewed all imaging, labs and have interpreted them.  Patient presents after having a unwitnessed fall.  Patient was alert and oriented did not appear to be in acute distress, vital signs reassuring.  Patient's chest was examined, no gross abnormalities noted, lung sounds are clear bilaterally, abdomen was nontender to palpation, spine was palpated tenderness upon palpation along her lumbar spine, patient had tenderness of her left shoulder with decreased range of  motion with flexion, 4/5 strength, and left hip pain but had had full range of motion, 5 /5 strength, neurovascularly intact.  Will order screening labs, EKG, and further imaging for evaluation.  14:14 initial labs reveal hypokalemia of 2.8, bun 67, creatinine 1.85.  After further review patient has chronic elevation of BUN 85-100 and creatinine of 2-2.8.  I suspect patient's hypokalemia is secondary to torsemide use.  We will provide her with IV potassium as well as oral potassium.  CBC does not show leukocytosis, normocytic anemia which appears to be at baseline for patien most likely secondary to chronic diseases .  Patient's troponin elevated at 90 above her normal baseline of 70.  CT lumbar spine showed stenosis from L1-L4 without acute abnormalities.  Chest x-ray was unremarkable, left shoulder did not show acute abnormalities, left hip did not show any acute abnormalities.  Due to history of cardiomyopathy as  well as elevated troponins and hypokalemia will consult cardiologist for further evaluation recommendations.  15:52 spoke with cardiologist Dr. Harl Bowie who has low suspicion that this patient is suffering from a cardiac abnormality.  he feels this might be secondary to dehydration he recommends follow-up troponin as well as orthostatics for further evaluation.  He will ensure she has close follow-up with Dr. Domenic Polite her cardiologist.   I have low suspicion for cardiac abnormality as patient denies chest pain, shortness of breath, troponin is trending down, chest x-ray unremarkable, EKG shows first-degree AV block, sinus rhythm without signs of ischemia.  Likely troponin is elevated due to CKD.  Low suspicion for systemic infection as patient is nontoxic-appearing, vital signs reassuring, no obvious source of infection noted on exam, no leukocytosis noted on CBC.  I suspect patient's hypokalemia is secondary to torsemide as her magnesium was 1.7.  I provided her with IV potassium as well as oral  potassium. will encourage her to continue taking potassium at home.  Low suspicion for CVA or intracranial head bleed as patient had no neuro deficits, denies head pain, recent falls.  ambulate patient and she did not have abnormal gait, denies feeling dizzy or off balance.  Low suspicion for acute abdomen require surgical intervention as abdomen was benign tolerating p.o. without difficulty.  Patient appears resting comfortably show no acute signs distress, vital signs have remained stable does not meet criteria to be admitted to the hospital.  I suspect patient had a mechanical fall and the hypokalemia was an incidental finding.  Recommend patient continues with her prescription of potassium and to follow-up with her nephrologist for further management of her electrolytes and torsemide.  Patient was discussed with attending who will evaluate the patient agrees with assessment and plan.  Patient was given at home care as well as strict return precautions.  Patient verbalized that she understood and agreed with the plan.     Final Clinical Impression(s) / ED Diagnoses Final diagnoses:  Hypokalemia  Chronic kidney disease, unspecified CKD stage  Fall, initial encounter    Rx / DC Orders ED Discharge Orders    None       Aron Baba 09/23/19 1917    Elnora Morrison, MD 09/24/19 [phone]

## 2019-09-23 NOTE — ED Triage Notes (Signed)
Pt states she was getting up to go to the bathroom and fell last night landing on her left hip

## 2019-09-23 NOTE — Discharge Instructions (Signed)
Patient presents after a fall.  Lab work and imaging look reassuring.  I want you to continue taking your potassium pills as prescribed.   I want you to follow-up with your kidney doctor as I feel they may want to adjust your fluid pills please call at your earliest convenience.  I want you to come back to the emergency department if you have severe chest pain, shortness of breath, severe abdominal pain, uncontrolled nausea, vomiting, diarrhea as he symptoms require further evaluation and management.

## 2019-09-24 ENCOUNTER — Telehealth: Payer: Self-pay | Admitting: Cardiology

## 2019-09-24 NOTE — Telephone Encounter (Signed)
Pt notified of Echo results

## 2019-09-24 NOTE — Telephone Encounter (Signed)
Pt would like lab results from yesterday.    Please call [phone]

## 2019-09-24 NOTE — Telephone Encounter (Signed)
Patient waiting on return call for results

## 2019-10-01 ENCOUNTER — Encounter (HOSPITAL_COMMUNITY): Payer: Self-pay

## 2019-10-01 ENCOUNTER — Encounter (HOSPITAL_COMMUNITY)
Admission: RE | Admit: 2019-10-01 | Discharge: 2019-10-01 | Disposition: A | Discharge status: Home / Self Care | Payer: PPO | Source: Ambulatory Visit | Attending: Nephrology | Admitting: Nephrology

## 2019-10-01 ENCOUNTER — Other Ambulatory Visit: Payer: Self-pay

## 2019-10-01 DIAGNOSIS — N185 Chronic kidney disease, stage 5: Secondary | ICD-10-CM | POA: Insufficient documentation

## 2019-10-01 DIAGNOSIS — E1122 Type 2 diabetes mellitus with diabetic chronic kidney disease: Secondary | ICD-10-CM | POA: Insufficient documentation

## 2019-10-01 DIAGNOSIS — E211 Secondary hyperparathyroidism, not elsewhere classified: Secondary | ICD-10-CM | POA: Diagnosis not present

## 2019-10-01 DIAGNOSIS — R809 Proteinuria, unspecified: Secondary | ICD-10-CM | POA: Insufficient documentation

## 2019-10-01 DIAGNOSIS — I422 Other hypertrophic cardiomyopathy: Secondary | ICD-10-CM | POA: Diagnosis not present

## 2019-10-01 DIAGNOSIS — D631 Anemia in chronic kidney disease: Secondary | ICD-10-CM | POA: Insufficient documentation

## 2019-10-01 DIAGNOSIS — I129 Hypertensive chronic kidney disease with stage 1 through stage 4 chronic kidney disease, or unspecified chronic kidney disease: Secondary | ICD-10-CM | POA: Insufficient documentation

## 2019-10-01 LAB — POTASSIUM: Potassium: 3.1 mmol/L — ABNORMAL LOW (ref 3.5–5.1)

## 2019-10-01 LAB — POCT HEMOGLOBIN-HEMACUE: Hemoglobin: 10.1 g/dL — ABNORMAL LOW (ref 12.0–15.0)

## 2019-10-01 MED ORDER — EPOETIN ALFA 3000 UNIT/ML IJ SOLN: 3000.0000 [IU] | Freq: Once | INTRAMUSCULAR | Status: DC

## 2019-10-12 ENCOUNTER — Other Ambulatory Visit: Payer: Self-pay | Admitting: Endocrinology

## 2019-10-12 ENCOUNTER — Other Ambulatory Visit: Payer: Self-pay | Admitting: Family Medicine

## 2019-10-15 ENCOUNTER — Encounter (HOSPITAL_COMMUNITY): Payer: Self-pay

## 2019-10-15 ENCOUNTER — Encounter (HOSPITAL_COMMUNITY)
Admission: RE | Admit: 2019-10-15 | Discharge: 2019-10-15 | Disposition: A | Discharge status: Home / Self Care | Payer: PPO | Source: Ambulatory Visit | Attending: Nephrology | Admitting: Nephrology

## 2019-10-15 ENCOUNTER — Other Ambulatory Visit: Payer: Self-pay

## 2019-10-15 DIAGNOSIS — I129 Hypertensive chronic kidney disease with stage 1 through stage 4 chronic kidney disease, or unspecified chronic kidney disease: Secondary | ICD-10-CM | POA: Diagnosis not present

## 2019-10-15 LAB — POCT HEMOGLOBIN-HEMACUE: Hemoglobin: 11.1 g/dL — ABNORMAL LOW (ref 12.0–15.0)

## 2019-10-15 MED ORDER — EPOETIN ALFA 3000 UNIT/ML IJ SOLN: 3000.0000 [IU] | Freq: Once | INTRAMUSCULAR | Status: DC

## 2019-10-16 DIAGNOSIS — E876 Hypokalemia: Secondary | ICD-10-CM | POA: Diagnosis not present

## 2019-10-20 ENCOUNTER — Encounter: Payer: Self-pay | Admitting: Endocrinology

## 2019-10-20 ENCOUNTER — Ambulatory Visit: Payer: PPO | Admitting: Endocrinology

## 2019-10-20 ENCOUNTER — Other Ambulatory Visit: Payer: Self-pay

## 2019-10-20 VITALS — BP 136/60 | HR 80 | Ht 63.0 in | Wt 177.8 lb

## 2019-10-20 DIAGNOSIS — Z794 Long term (current) use of insulin: Secondary | ICD-10-CM

## 2019-10-20 DIAGNOSIS — N184 Chronic kidney disease, stage 4 (severe): Secondary | ICD-10-CM

## 2019-10-20 DIAGNOSIS — E1165 Type 2 diabetes mellitus with hyperglycemia: Secondary | ICD-10-CM | POA: Diagnosis not present

## 2019-10-20 DIAGNOSIS — E1122 Type 2 diabetes mellitus with diabetic chronic kidney disease: Secondary | ICD-10-CM | POA: Diagnosis not present

## 2019-10-20 LAB — POCT GLYCOSYLATED HEMOGLOBIN (HGB A1C): Hemoglobin A1C: 5.9 % — AB (ref 4.0–5.6)

## 2019-10-20 NOTE — Progress Notes (Signed)
Subjective:    Patient ID: Krista Strickland, female    DOB: Nov 23, 1950, 69 y.o.   MRN: [id number]  HPI Pt returns for f/u of diabetes.   DM type: insulin-requiring type 2 Dx'ed: 7858 Complications: PN, stage 4 CRI and CVA.  Therapy: insulin since 2010 GDM: never DKA: never Severe hypoglycemia: never.   Pancreatitis: never.   SDOH: she cannot afford insulin analogs Other: she declines weight loss surgery; she had cutaneous reactions to NPH and lantus; she has declined MDI's. so we had to try 70/30 (the NPH component caused no reaction this time).  She takes 70/30, due to the pattern of cbg's.   Interval history:  no cbg record, but states cbg's vary from 140-200.  It is in general higher as the day goes on. Main symptom is doe.  She says she never misses the insulin.  no recent steroids.  Pt also has small multinodular goiter (euthyroid; f/u US in 2017 showed no change, with no thyroid nodule meeting criteria for biopsy or surveillance; she is euthyroid off rx).   Past Medical History:  Diagnosis Date  . Anemia   . Arthritis   . CKD (chronic kidney disease) stage 3, GFR 30-59 ml/min   . Diabetes mellitus, type 2 ([place])   . Essential hypertension   . GERD (gastroesophageal reflux disease)   . Gout   . History of MRSA infection 03/2009  . History of stroke 2013  . Hypercalcemia 2017   Managed by nephrology  . Hyperlipidemia   . Obesity   . Psoriasis   . Uterine cancer ([place])   . 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 ABDOMINAL HYSTERECTOMY    . APPENDECTOMY  2012  . COLONOSCOPY WITH PROPOFOL N/A 08/24/2016   Procedure: COLONOSCOPY WITH PROPOFOL;  Surgeon: Rogene Houston, MD;  [facility];  Service: Endoscopy;  Laterality: N/A;  10:30  . Excison of right breast cyst    . LAPAROSCOPIC SALPINGO OOPHERECTOMY Right 04/22/2012   Procedure: LAPAROSCOPIC SALPINGO OOPHORECTOMY;  Surgeon: Jonnie Kind, MD;  Location:  [facility];  Service: Gynecology;  Laterality: Right;  end 11:17  . MASS EXCISION N/A 04/22/2012   Procedure: EXCISION SKIN TAGS NECK AND HEAD;  Surgeon: Jonnie Kind, MD;  Location: [facility];  Service: Gynecology;  Laterality: N/A;  start 11:19  . PARTIAL HYSTERECTOMY    . POLYPECTOMY  08/24/2016   Procedure: POLYPECTOMY;  Surgeon: Rogene Houston, MD;  Location[facility];  Service: Endoscopy;;  colon  . Right neck biopsy      Social History   Socioeconomic History  . Marital status: Married    Spouse name: Not on file  . Number of children: 1  . Years of education: Not on file  . Highest education level: 11th grade  Occupational History  . Occupation: disabled     Fish farm manager: UNEMPLOYED  Tobacco Use  . Smoking status: Former Smoker    Packs/day: 0.25    Years: 12.00    Pack years: 3.00    Quit date: 07/16/1978    Years since quitting: 41.2  . Smokeless tobacco: Never Used  Vaping Use  . Vaping Use: Never used  Substance and Sexual Activity  . Alcohol use: No  . Drug use: No  . Sexual activity: Yes    Birth control/protection: Surgical  Other Topics Concern  . Not on file  Social History Narrative  .  Not on file   Social Determinants of Health   Financial Resource Strain:   . Difficulty of Paying Living Expenses: Not on file  Food Insecurity:   . Worried About Charity fundraiser in the Last Year: Not on file  . Ran Out of Food in the Last Year: Not on file  Transportation Needs:   . Lack of Transportation (Medical): Not on file  . Lack of Transportation (Non-Medical): Not on file  Physical Activity:   . Days of Exercise per Week: Not on file  . Minutes of Exercise per Session: Not on file  Stress:   . Feeling of Stress : Not on file  Social Connections:   . Frequency of Communication with Friends and Family: Not on file  . Frequency of Social Gatherings with Friends and Family: Not on file  . Attends Religious Services: Not on file  . Active Member of Clubs  or Organizations: Not on file  . Attends Archivist Meetings: Not on file  . Marital Status: Not on file  Intimate Partner Violence:   . Fear of Current or Ex-Partner: Not on file  . Emotionally Abused: Not on file  . Physically Abused: Not on file  . Sexually Abused: Not on file    Current Outpatient Medications on File Prior to Visit  Medication Sig Dispense Refill  . allopurinol (ZYLOPRIM) 300 MG tablet Take 1 tablet by mouth once daily 90 tablet 5  . aspirin 325 MG tablet Take 1 tablet (325 mg total) by mouth every morning. 30 tablet 0  . DILT-XR 180 MG 24 hr capsule Take 1 capsule by mouth once daily 90 capsule 0  . docusate sodium (COLACE) 100 MG capsule Take 100 mg by mouth daily as needed for mild constipation or moderate constipation.     Marland Kitchen epoetin alfa (EPOGEN) 3000 UNIT/ML injection Inject 3,000 Units into the vein every 14 (fourteen) days.    Marland Kitchen ezetimibe (ZETIA) 10 MG tablet Take 1 tablet by mouth once daily 90 tablet 0  . hydrALAZINE (APRESOLINE) 25 MG tablet TAKE 1 TABLET BY MOUTH TWICE DAILY (AT  9:00  AM  AND  9:00  PM) 180 tablet 0  . HYDROcodone-acetaminophen (NORCO/VICODIN) 5-325 MG tablet One tablet every six hours for pain.  Limit 7 days. 28 tablet 0  . metolazone (ZAROXOLYN) 5 MG tablet Take 1 tablet by mouth once daily 90 tablet 0  . metoprolol tartrate (LOPRESSOR) 50 MG tablet Take 1 tablet (50 mg total) by mouth 2 (two) times daily. 30 tablet 1  . midodrine (PROAMATINE) 5 MG tablet Take 5 mg by mouth 3 (three) times daily.    . potassium chloride (KLOR-CON) 10 MEQ tablet TAKE 3 TABLETS BY MOUTH THREE TIMES DAILY 270 tablet 1  . pravastatin (PRAVACHOL) 20 MG tablet TAKE 1 TABLET BY MOUTH ONCE DAILY BEFORE BREAKFAST 90 tablet 1  . torsemide (DEMADEX) 20 MG tablet Take 2 tablets (40 mg total) by mouth 2 (two) times daily. 56 tablet 0   No current facility-administered medications on file prior to visit.    Allergies  Allergen Reactions  . Benazepril  Swelling  . Metronidazole Hives  . Mobic [Meloxicam] Hives  . Penicillins Hives and Swelling  . Sulfonamide Derivatives Hives    Family History  Problem Relation Age of Onset  . Hypertension Brother   . Gout Brother   . Prostate cancer Brother   . Hypertension Brother   . Prostate cancer Brother   . Gout  Brother   . Hypertension Sister   . Gout Sister   . Leukemia Sister 42  . Pancreatic cancer Sister 11  . Gout Sister   . Prostate cancer Brother   . Diabetes Neg Hx     BP 136/60   Pulse 80   Ht 5\' 3"  (1.6 m)   Wt 177 lb 12.8 oz (80.6 kg)   SpO2 93%   BMI 31.50 kg/m    Review of Systems She denies hypoglycemia    Objective:   Physical Exam VITAL SIGNS:  See vs page GENERAL: no distress Pulses: dorsalis pedis intact bilat.   MSK: no deformity of the feet CV: 2+ bilat leg edema.   Skin:  no ulcer on the feet.  normal color and temp on the feet. Neuro: sensation is intact to touch on the feet, but decreased from normal.    Lab Results  Component Value Date   HGBA1C 5.9 (A) 10/20/2019   Lab Results  Component Value Date   CREATININE 1.85 (H) 09/23/2019   BUN 67 (H) 09/23/2019   NA 135 09/23/2019   K 3.1 (L) 10/01/2019   CL 94 (L) 09/23/2019   CO2 28 09/23/2019       Assessment & Plan:  Insulin-requiring type 2 DM, with CVA.   Renal failure: we should verify this A1c with fructosamine.    Patient Instructions  A different type of diabetes blood test is requested for you today.  We'll let you know about the results.   check your blood sugar twice a day.  vary the time of day when you check, between before the 3 meals, and at bedtime.  also check if you have symptoms of your blood sugar being too high or too low.  please keep a record of the readings and bring it to your next appointment here.  You can write it on any piece of paper.  please call us sooner if your blood sugar goes below 70, or if you have a lot of readings over 200.   Please continue the  same insulins.   On this type of insulin schedule, you should eat meals on a regular schedule.  If a meal is missed or significantly delayed, your blood sugar could go low.   Please come back for a follow-up appointment in 2-3 months.

## 2019-10-20 NOTE — Patient Instructions (Addendum)
A different type of diabetes blood test is requested for you today.  We'll let you know about the results.   check your blood sugar twice a day.  vary the time of day when you check, between before the 3 meals, and at bedtime.  also check if you have symptoms of your blood sugar being too high or too low.  please keep a record of the readings and bring it to your next appointment here.  You can write it on any piece of paper.  please call us sooner if your blood sugar goes below 70, or if you have a lot of readings over 200.   Please continue the same insulins.   On this type of insulin schedule, you should eat meals on a regular schedule.  If a meal is missed or significantly delayed, your blood sugar could go low.   Please come back for a follow-up appointment in 2-3 months.

## 2019-10-23 LAB — FRUCTOSAMINE: Fructosamine: 264 umol/L (ref 205–285)

## 2019-10-23 MED ORDER — NOVOLIN 70/30 RELION (70-30) 100 UNIT/ML ~~LOC~~ SUSP
180.0000 [IU] | Freq: Every day | SUBCUTANEOUS | 0 refills | Status: DC
Start: 2019-10-23 — End: 2019-10-26

## 2019-10-26 ENCOUNTER — Telehealth: Payer: Self-pay

## 2019-10-26 MED ORDER — NOVOLIN 70/30 RELION (70-30) 100 UNIT/ML ~~LOC~~ SUSP: SUBCUTANEOUS | 0 refills | Status: DC

## 2019-10-26 NOTE — Telephone Encounter (Signed)
Informed patient to reduce insulin to 90 units in the morning.

## 2019-10-26 NOTE — Addendum Note (Signed)
Addended by: Amado Coe on: 10/26/2019 10:17 AM   Modules accepted: Orders

## 2019-10-26 NOTE — Telephone Encounter (Signed)
OK, so please reduce to 90 units qam.

## 2019-10-26 NOTE — Telephone Encounter (Signed)
Spoke with patient and she says she is taking 100 units of 70/30 in the morning, not 180 units.

## 2019-10-26 NOTE — Telephone Encounter (Signed)
-----   Message from Renato Shin, MD sent at 10/23/2019  4:02 PM EDT ----- please contact patient: This is like an A1c of approx 6.5%, which is too low.  Please verify that you take 70/30 insulin, 180 units with breakfast, and that this is the only insulin you take.  Then please reduce to 170 units qam (with breakfast).

## 2019-10-26 NOTE — Telephone Encounter (Signed)
Left message for patient to call back to clarify medication usage and lab results.

## 2019-10-27 ENCOUNTER — Institutional Professional Consult (permissible substitution): Payer: PPO | Admitting: Pulmonary Disease

## 2019-10-28 ENCOUNTER — Ambulatory Visit: Payer: PPO | Admitting: Family Medicine

## 2019-10-29 ENCOUNTER — Encounter (HOSPITAL_COMMUNITY): Payer: Self-pay

## 2019-10-29 ENCOUNTER — Encounter (HOSPITAL_COMMUNITY)
Admission: RE | Admit: 2019-10-29 | Discharge: 2019-10-29 | Disposition: A | Discharge status: Home / Self Care | Payer: PPO | Source: Ambulatory Visit | Attending: Nephrology | Admitting: Nephrology

## 2019-10-29 ENCOUNTER — Other Ambulatory Visit: Payer: Self-pay

## 2019-10-29 DIAGNOSIS — I129 Hypertensive chronic kidney disease with stage 1 through stage 4 chronic kidney disease, or unspecified chronic kidney disease: Secondary | ICD-10-CM | POA: Diagnosis not present

## 2019-10-29 LAB — PROTEIN / CREATININE RATIO, URINE
Creatinine, Urine: 61.09 mg/dL
Protein Creatinine Ratio: 1 mg/mg{Cre} — ABNORMAL HIGH (ref 0.00–0.15)
Total Protein, Urine: 61 mg/dL

## 2019-10-29 LAB — CBC
HCT: 33.2 % — ABNORMAL LOW (ref 36.0–46.0)
Hemoglobin: 10.5 g/dL — ABNORMAL LOW (ref 12.0–15.0)
MCH: 28.5 pg (ref 26.0–34.0)
MCHC: 31.6 g/dL (ref 30.0–36.0)
MCV: 90 fL (ref 80.0–100.0)
Platelets: 302 10*3/uL (ref 150–400)
RBC: 3.69 MIL/uL — ABNORMAL LOW (ref 3.87–5.11)
RDW: 20 % — ABNORMAL HIGH (ref 11.5–15.5)
WBC: 12.3 10*3/uL — ABNORMAL HIGH (ref 4.0–10.5)
nRBC: 0 % (ref 0.0–0.2)

## 2019-10-29 LAB — RENAL FUNCTION PANEL
Albumin: 3.8 g/dL (ref 3.5–5.0)
Anion gap: 14 (ref 5–15)
BUN: 62 mg/dL — ABNORMAL HIGH (ref 8–23)
CO2: 25 mmol/L (ref 22–32)
Calcium: 9.8 mg/dL (ref 8.9–10.3)
Chloride: 94 mmol/L — ABNORMAL LOW (ref 98–111)
Creatinine, Ser: 1.71 mg/dL — ABNORMAL HIGH (ref 0.44–1.00)
GFR calc Af Amer: 35 mL/min — ABNORMAL LOW (ref >60)
GFR calc non Af Amer: 30 mL/min — ABNORMAL LOW (ref >60)
Glucose, Bld: 154 mg/dL — ABNORMAL HIGH (ref 70–99)
Phosphorus: 2.3 mg/dL — ABNORMAL LOW (ref 2.5–4.6)
Potassium: 3.1 mmol/L — ABNORMAL LOW (ref 3.5–5.1)
Sodium: 133 mmol/L — ABNORMAL LOW (ref 135–145)

## 2019-10-29 LAB — POCT HEMOGLOBIN-HEMACUE: Hemoglobin: 10.5 g/dL — ABNORMAL LOW (ref 12.0–15.0)

## 2019-10-29 MED ORDER — EPOETIN ALFA 3000 UNIT/ML IJ SOLN: 3000.0000 [IU] | Freq: Once | INTRAMUSCULAR | Status: DC

## 2019-10-30 ENCOUNTER — Ambulatory Visit (INDEPENDENT_AMBULATORY_CARE_PROVIDER_SITE_OTHER): Payer: PPO | Admitting: Student

## 2019-10-30 ENCOUNTER — Encounter: Payer: Self-pay | Admitting: Student

## 2019-10-30 VITALS — BP 142/58 | HR 64 | Ht 63.0 in | Wt 182.6 lb

## 2019-10-30 DIAGNOSIS — I10 Essential (primary) hypertension: Secondary | ICD-10-CM

## 2019-10-30 DIAGNOSIS — E876 Hypokalemia: Secondary | ICD-10-CM | POA: Diagnosis not present

## 2019-10-30 DIAGNOSIS — I129 Hypertensive chronic kidney disease with stage 1 through stage 4 chronic kidney disease, or unspecified chronic kidney disease: Secondary | ICD-10-CM | POA: Diagnosis not present

## 2019-10-30 DIAGNOSIS — Z794 Long term (current) use of insulin: Secondary | ICD-10-CM | POA: Diagnosis not present

## 2019-10-30 DIAGNOSIS — D631 Anemia in chronic kidney disease: Secondary | ICD-10-CM | POA: Diagnosis not present

## 2019-10-30 DIAGNOSIS — H1820 Unspecified corneal edema: Secondary | ICD-10-CM | POA: Diagnosis not present

## 2019-10-30 DIAGNOSIS — R809 Proteinuria, unspecified: Secondary | ICD-10-CM | POA: Diagnosis not present

## 2019-10-30 DIAGNOSIS — N1832 Chronic kidney disease, stage 3b: Secondary | ICD-10-CM | POA: Diagnosis not present

## 2019-10-30 DIAGNOSIS — H2513 Age-related nuclear cataract, bilateral: Secondary | ICD-10-CM | POA: Diagnosis not present

## 2019-10-30 DIAGNOSIS — E109 Type 1 diabetes mellitus without complications: Secondary | ICD-10-CM | POA: Diagnosis not present

## 2019-10-30 DIAGNOSIS — E1129 Type 2 diabetes mellitus with other diabetic kidney complication: Secondary | ICD-10-CM | POA: Diagnosis not present

## 2019-10-30 DIAGNOSIS — N189 Chronic kidney disease, unspecified: Secondary | ICD-10-CM | POA: Diagnosis not present

## 2019-10-30 DIAGNOSIS — I421 Obstructive hypertrophic cardiomyopathy: Secondary | ICD-10-CM

## 2019-10-30 DIAGNOSIS — E871 Hypo-osmolality and hyponatremia: Secondary | ICD-10-CM | POA: Diagnosis not present

## 2019-10-30 DIAGNOSIS — Z5181 Encounter for therapeutic drug level monitoring: Secondary | ICD-10-CM | POA: Diagnosis not present

## 2019-10-30 DIAGNOSIS — E1122 Type 2 diabetes mellitus with diabetic chronic kidney disease: Secondary | ICD-10-CM | POA: Diagnosis not present

## 2019-10-30 DIAGNOSIS — H18832 Recurrent erosion of cornea, left eye: Secondary | ICD-10-CM | POA: Diagnosis not present

## 2019-10-30 LAB — HM DIABETES EYE EXAM

## 2019-10-30 NOTE — Progress Notes (Signed)
Cardiology Office Note    Date:  10/31/2019   ID:  Krista Strickland, DOB 08-Jul-1950, MRN [id number]  PCP:  Fayrene Helper, MD  Cardiologist: Rozann Lesches, MD    Chief Complaint  Patient presents with  . Follow-up    6 week visit    History of Present Illness:    Krista Strickland is a 69 y.o. female with past medical history of hypertrophic obstructive cardiomyopathy, HTN, HLD, Type 2 DM and Stage 3-4 CKD who presents to the office today for 6-week follow-up.  She was last examined by Dr. Domenic Polite in 08/2019 and reported having dyspnea on exertion but denied any recent chest pain or palpitations. Hgb had previously been down to 6.4 and she had been started on Epogen with close follow-up with her PCP arranged. A follow-up echocardiogram was recommended for reassessment of EF. She was continued on Lopressor 50mg  BID and Cardizem CD 180mg  daily. Repeat echocardiogram showed a vigorous EF of 70 to 75% with severe symmetric LVH. RV function was normal. She did not have any significant valve abnormalities.  She did have repeat labs on 9/30 and Hgb was stable at 10.5. Creatinine was improved to 1.71 but K+ was low at 3.1.  In talking with the patient and her husband today, she continues to have issues with lower extremity edema which has been occurring for years per her report. It is unclear if she always takes her evening Torsemide as she says this keeps her up at night due to frequent urination. She did follow-up with Dr. Theador Hawthorne earlier today but is unsure if he made any changes to her medication regimen. She denies any orthopnea, PND, chest pain or palpitations. Breathing has been stable with no recent issues. Says she did fall several weeks ago due to tripping in her hallway but did not have a syncopal episode.    Past Medical History:  Diagnosis Date  . Anemia   . Arthritis   . CKD (chronic kidney disease) stage 3, GFR 30-59 ml/min ([facility])   . Diabetes mellitus, type 2 ([place])   .  Essential hypertension   . GERD (gastroesophageal reflux disease)   . Gout   . History of MRSA infection 03/2009  . History of stroke 2013  . HOCM (hypertrophic obstructive cardiomyopathy) ([place])   . Hypercalcemia 2017   Managed by nephrology  . Hyperlipidemia   . Obesity   . Psoriasis   . Uterine cancer ([place])   . 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 ABDOMINAL HYSTERECTOMY    . APPENDECTOMY  2012  . COLONOSCOPY WITH PROPOFOL N/A 08/24/2016   Procedure: COLONOSCOPY WITH PROPOFOL;  Surgeon: Rogene Houston, MD;  [facility];  Service: Endoscopy;  Laterality: N/A;  10:30  . Excison of right breast cyst    . LAPAROSCOPIC SALPINGO OOPHERECTOMY Right 04/22/2012   Procedure: LAPAROSCOPIC SALPINGO OOPHORECTOMY;  Surgeon: Krista Kind, MD;  Location: [facility];  Service: Gynecology;  Laterality: Right;  end 11:17  . MASS EXCISION N/A 04/22/2012   Procedure: EXCISION SKIN TAGS NECK AND HEAD;  Surgeon: Krista Kind, MD;  Location: [facility];  Service: Gynecology;  Laterality: N/A;  start 11:19  . PARTIAL HYSTERECTOMY    . POLYPECTOMY  08/24/2016   Procedure: POLYPECTOMY;  Surgeon: Rogene Houston, MD;  [facility];  Service: Endoscopy;;  colon  . Right neck biopsy  Current Medications: Outpatient Medications Prior to Visit  Medication Sig Dispense Refill  . allopurinol (ZYLOPRIM) 300 MG tablet Take 1 tablet by mouth once daily 90 tablet 5  . aspirin 325 MG tablet Take 1 tablet (325 mg total) by mouth every morning. 30 tablet 0  . DILT-XR 180 MG 24 hr capsule Take 1 capsule by mouth once daily 90 capsule 0  . docusate sodium (COLACE) 100 MG capsule Take 100 mg by mouth daily as needed for mild constipation or moderate constipation.     Marland Kitchen ezetimibe (ZETIA) 10 MG tablet Take 1 tablet by mouth once daily 90 tablet 0  . hydrALAZINE (APRESOLINE) 25 MG tablet TAKE 1 TABLET BY MOUTH TWICE DAILY (AT  9:00  AM  AND   9:00  PM) 180 tablet 0  . insulin NPH-regular Human (NOVOLIN 70/30 RELION) (70-30) 100 UNIT/ML injection 90 units in the morning 70 mL 0  . metolazone (ZAROXOLYN) 5 MG tablet Take 1 tablet by mouth once daily 90 tablet 0  . metoprolol tartrate (LOPRESSOR) 50 MG tablet Take 1 tablet (50 mg total) by mouth 2 (two) times daily. (Patient taking differently: Take 25 mg by mouth 2 (two) times daily. ) 30 tablet 1  . midodrine (PROAMATINE) 5 MG tablet Take 5 mg by mouth 3 (three) times daily.    . potassium chloride (KLOR-CON) 10 MEQ tablet TAKE 3 TABLETS BY MOUTH THREE TIMES DAILY (Patient taking differently: 3 (three) times daily. 4 Tablets Three times Daily) 270 tablet 1  . pravastatin (PRAVACHOL) 20 MG tablet TAKE 1 TABLET BY MOUTH ONCE DAILY BEFORE BREAKFAST 90 tablet 1  . torsemide (DEMADEX) 20 MG tablet Take 2 tablets (40 mg total) by mouth 2 (two) times daily. 56 tablet 0  . epoetin alfa (EPOGEN) 3000 UNIT/ML injection Inject 3,000 Units into the vein every 14 (fourteen) days. (Patient not taking: Reported on 10/30/2019)    . HYDROcodone-acetaminophen (NORCO/VICODIN) 5-325 MG tablet One tablet every six hours for pain.  Limit 7 days. 28 tablet 0   No facility-administered medications prior to visit.     Allergies:   Benazepril, Metronidazole, Mobic [meloxicam], Penicillins, and Sulfonamide derivatives   Social History   Socioeconomic History  . Marital status: Married    Spouse name: Not on file  . Number of children: 1  . Years of education: Not on file  . Highest education level: 11th grade  Occupational History  . Occupation: disabled     Fish farm manager: UNEMPLOYED  Tobacco Use  . Smoking status: Former Smoker    Packs/day: 0.25    Years: 12.00    Pack years: 3.00    Quit date: 07/16/1978    Years since quitting: 41.3  . Smokeless tobacco: Never Used  Vaping Use  . Vaping Use: Never used  Substance and Sexual Activity  . Alcohol use: No  . Drug use: No  . Sexual activity: Yes     Birth control/protection: Surgical  Other Topics Concern  . Not on file  Social History Narrative  . Not on file   Social Determinants of Health   Financial Resource Strain:   . Difficulty of Paying Living Expenses: Not on file  Food Insecurity:   . Worried About Charity fundraiser in the Last Year: Not on file  . Ran Out of Food in the Last Year: Not on file  Transportation Needs:   . Lack of Transportation (Medical): Not on file  . Lack of Transportation (Non-Medical): Not on file  Physical Activity:   . Days of Exercise per Week: Not on file  . Minutes of Exercise per Session: Not on file  Stress:   . Feeling of Stress : Not on file  Social Connections:   . Frequency of Communication with Friends and Family: Not on file  . Frequency of Social Gatherings with Friends and Family: Not on file  . Attends Religious Services: Not on file  . Active Member of Clubs or Organizations: Not on file  . Attends Archivist Meetings: Not on file  . Marital Status: Not on file     Family History:  The patient's family history includes Gout in her brother, brother, sister, and sister; Hypertension in her brother, brother, and sister; Leukemia (age of onset: 42) in her sister; Pancreatic cancer (age of onset: 68) in her sister; Prostate cancer in her brother, brother, and brother.   Review of Systems:   Please see the history of present illness.     General:  No chills, fever, night sweats or weight changes.  Cardiovascular:  No chest pain, orthopnea, palpitations, paroxysmal nocturnal dyspnea. Positive for dyspnea on exertion (stable) and edema.  Dermatological: No rash, lesions/masses Respiratory: No cough, dyspnea Urologic: No hematuria, dysuria Abdominal:   No nausea, vomiting, diarrhea, bright red blood per rectum, melena, or hematemesis Neurologic:  No visual changes, wkns, changes in mental status. All other systems reviewed and are otherwise negative except as noted  above.   Physical Exam:    VS:  BP (!) 142/58   Pulse 64   Ht 5\' 3"  (1.6 m)   Wt 182 lb 9.6 oz (82.8 kg)   SpO2 98%   BMI 32.35 kg/m    General: Well developed, well nourished,female appearing in no acute distress. Head: Normocephalic, atraumatic. Neck: No carotid bruits. JVD not elevated.  Lungs: Respirations regular and unlabored, without wheezes or rales.  Heart: Regular rate and rhythm. No S3 or S4. 2/6 SEM best appreciated along RUSB.  Abdomen: Appears non-distended. No obvious abdominal masses. Msk:  Strength and tone appear normal for age. No obvious joint deformities or effusions. Extremities: No clubbing or cyanosis. 1+ pitting edema bilaterally.  Distal pedal pulses are 2+ bilaterally. Neuro: Alert and oriented X 3. Moves all extremities spontaneously. No focal deficits noted. Psych:  Responds to questions appropriately with a normal affect. Skin: No rashes or lesions noted  Wt Readings from Last 3 Encounters:  10/30/19 182 lb 9.6 oz (82.8 kg)  10/20/19 177 lb 12.8 oz (80.6 kg)  09/23/19 178 lb (80.7 kg)     Studies/Labs Reviewed:   EKG:  EKG is not ordered today.    Recent Labs: 07/22/2019: ALT 20; B Natriuretic Peptide 423.0; TSH 1.411 09/23/2019: Magnesium 1.7 10/29/2019: BUN 62; Creatinine, Ser 1.71; Hemoglobin 10.5; Platelets 302; Potassium 3.1; Sodium 133   Lipid Panel    Component Value Date/Time   CHOL 155 02/11/2019 1026   CHOL 117 09/07/2017 0930   TRIG 150 (H) 02/11/2019 1026   HDL 32 (L) 02/11/2019 1026   HDL 24 (L) 09/07/2017 0930   CHOLHDL 4.8 02/11/2019 1026   VLDL 55 (H) 06/01/2016 1048   LDLCALC 98 02/11/2019 1026    Additional studies/ records that were reviewed today include:   Echocardiogram: 08/2019 IMPRESSIONS    1. There is mild SAM of the anterior mitral valve leaflet in the setting  of severe symmertic LVH and hyperdynamic LV function. Dynamic LVOT  gradient of 38 mmHg. Marland Kitchen Left ventricular ejection fraction,  by estimation,    is 70 to 75%. The left ventricle has  hyperdynamic function. The left ventricle has no regional wall motion  abnormalities. There is severe left ventricular hypertrophy. Left  ventricular diastolic parameters are consistent with Grade I diastolic  dysfunction (impaired relaxation). Elevated  left atrial pressure.  2. Right ventricular systolic function is normal. The right ventricular  size is normal.  3. Left atrial size was severely dilated.  4. The mitral valve is normal in structure. Trivial mitral valve  regurgitation. Mild mitral stenosis.  5. The aortic valve is tricuspid. Aortic valve regurgitation is not  visualized. No aortic stenosis is present.  6. The inferior vena cava is normal in size with greater than 50%  respiratory variability, suggesting right atrial pressure of 3 mmHg.   Assessment:    1. HOCM (hypertrophic obstructive cardiomyopathy) ([place])   2. Essential hypertension   3. Anemia in stage 3b chronic kidney disease ([facility])   4. Stage 3b chronic kidney disease ([place])      Plan:   In order of problems listed above:  1. HOCM - She has a history of known HOCM as outlined above. During today's visit, she mentioned her son has been experiencing lower extremity edema and I recommended he be evaluated for potential HOCM and she plans to review this with him. We briefly discussed additional testing for her such as a cardiac MRI for assessment of LGE as this can be an indication for ICD placement but would readdress at follow-up.  - Her breathing has been at baseline and she denies any recent dizziness or syncopal episodes. Continue Cardizem CD 180mg  daily and Lopressor 50mg  BID. She does have pitting edema on examination but it is unclear if she is taking her diuretic as BID dosing. Listed as being on Metolazone 5mg  daily and Torsemide 40mg  BID which has been followed by Nephrology given her underlying CKD. I reviewed with her to take Metolazone 30 minutes prior to her  morning Torsemide and to try taking her second dose of Torsemide in the afternoon so this does not cause as frequent nocturia.    2. HTN - BP is slightly elevated at 142/58 during today's visit but has been well-controlled at home per her report. I encouraged her to keep a BP log. She was continued on her current regimen for now but Hydralazine could be further titrated in the future if needed.   3. Anemia - Previously anemic with Hgb in ~ 6.0 range earlier this year and started on Epogen. Hgb was stable at 10.5 by recent labs on 10/29/2019. Of note, she is on ASA 325mg  daily and from a cardiac perspective, this could be reduced to 81mg  daily.   4. Stage 3-4 CKD - Followed by Dr. Theador Hawthorne. Creatinine was improved to 1.71 by recent labs on 10/29/2019. K+ was low at 3.1 and she reports her K-dur dosing has already been adjusted. She was also provided a handout today for potassium content of foods.    Medication Adjustments/Labs and Tests Ordered: Current medicines are reviewed at length with the patient today.  Concerns regarding medicines are outlined above.  Medication changes, Labs and Tests ordered today are listed in the Patient Instructions below. Patient Instructions  Medication Instructions:   Take METOLAZONE 30 minutes prior to taking your morning TORSEMIDE.   Take your evening TORSEMIDE around 3:00 - 5:00 PM so you do not have to urinate as frequently at night.   Labwork:  None  Testing/Procedures:  None  Follow-Up:  With Dr. Domenic Polite in 4 months.   Any Other Special Instructions Will Be Listed Below (If Applicable).     If you need a refill on your cardiac medications before your next appointment, please call your pharmacy.   Signed, Erma Heritage, PA-C  10/31/2019 8:51 AM    [facility] [address] [place], [place] [address] Phone: [phone] Fax: [phone]

## 2019-10-30 NOTE — Patient Instructions (Addendum)
Medication Instructions:   Take METOLAZONE 30 minutes prior to taking your morning TORSEMIDE.   Take your evening TORSEMIDE around 3:00 - 5:00 PM so you do not have to urinate as frequently at night.   Labwork:  None  Testing/Procedures:  None  Follow-Up:  With Dr. Domenic Polite in 4 months.   Any Other Special Instructions Will Be Listed Below (If Applicable).     If you need a refill on your cardiac medications before your next appointment, please call your pharmacy.         Potassium Content of Foods  Potassium is a mineral found in many foods and drinks. It affects how the heart works, and helps keep fluids and minerals balanced in the body. The amount of potassium you need each day depends on your age and any medical conditions you may have. Talk to your health care provider or dietitian about how much potassium you need. The following lists of foods provide the general serving size for foods and the approximate amount of potassium in each serving, listed in milligrams (mg). Actual values may vary depending on the product and how it is processed. High in potassium The following foods and beverages have 200 mg or more of potassium per serving:  Apricots (raw) - 2 have 200 mg of potassium.  Apricots (dry) - 5 have 200 mg of potassium.  Artichoke - 1 medium has 345 mg of potassium.  Avocado -  fruit has 245 mg of potassium.  Banana - 1 medium fruit has 425 mg of potassium.  [place] or baked beans (canned) -  cup has 280 mg of potassium.  White beans (canned) -  cup has 595 mg potassium.  Beef roast - 3 oz has 320 mg of potassium.  Ground beef - 3 oz has 270 mg of potassium.  Beets (raw or cooked) -  cup has 260 mg of potassium.  Bran muffin - 2 oz has 300 mg of potassium.  Broccoli (cooked) -  cup has 230 mg of potassium.  Brussels sprouts -  cup has 250 mg of potassium.  Cantaloupe -  cup has 215 mg of potassium.  Cereal, 100% bran -  cup has  200-400 mg of potassium.  Cheeseburger -1 single fast food burger has 225-400 mg of potassium.  Chicken - 3 oz has 220 mg of potassium.  Clams (canned) - 3 oz has 535 mg of potassium.  Crab - 3 oz has 225 mg of potassium.  Dates - 5 have 270 mg of potassium.  Dried beans and peas -  cup has 300-475 mg of potassium.  Figs (dried) - 2 have 260 mg of potassium.  Fish (halibut, tuna, cod, snapper) - 3 oz has 480 mg of potassium.  Fish (salmon, haddock, swordfish, perch) - 3 oz has 300 mg of potassium.  Fish (tuna, canned) - 3 oz has 200 mg of potassium.  [place] fries (fast food) - 3 oz has 470 mg of potassium.  Granola with fruit and nuts -  cup has 200 mg of potassium.  Grapefruit juice -  cup has 200 mg of potassium.  Honeydew melon -  cup has 200 mg of potassium.  Kale (raw) - 1 cup has 300 mg of potassium.  Kiwi - 1 medium fruit has 240 mg of potassium.  Kohlrabi, rutabaga, parsnips -  cup has 280 mg of potassium.  Lentils -  cup has 365 mg of potassium.  Mango - 1 each has 325 mg of potassium.  Milk (nonfat, low-fat, whole, buttermilk) - 1 cup has 350-380 mg of potassium.  Milk (chocolate) - 1 cup has 420 mg of potassium  Molasses - 1 Tbsp has 295 mg of potassium.  Mushrooms -  cup has 280 mg of potassium.  Nectarine - 1 each has 275 mg of potassium.  Nuts (almonds, peanuts, hazelnuts, [place], cashew, mixed) - 1 oz has 200 mg of potassium.  Nuts (pistachios) - 1 oz has 295 mg of potassium.  Orange - 1 fruit has 240 mg of potassium.  Orange juice -  cup has 235 mg of potassium.  Papaya -  medium fruit has 390 mg of potassium.  Peanut butter (chunky) - 2 Tbsp has 240 mg of potassium.  Peanut butter (smooth) - 2 Tbsp has 210 mg of potassium.  Pear - 1 medium (200 mg) of potassium.  Pomegranate - 1 whole fruit has 400 mg of potassium.  Pomegranate juice -  cup has 215 mg of potassium.  Pork - 3 oz has 350 mg of potassium.  Potato chips  (salted) - 1 oz has 465 mg of potassium.  Potato (baked with skin) - 1 medium has 925 mg of potassium.  Potato (boiled) -  cup has 255 mg of potassium.  Potato (Mashed) -  cup has 330 mg of potassium.  Prune juice -  cup has 370 mg of potassium.  Prunes - 5 have 305 mg of potassium.  Pudding (chocolate) -  cup has 230 mg of potassium.  Pumpkin (canned) -  cup has 250 mg of potassium.  Raisins (seedless) -  cup has 270 mg of potassium.  Seeds (sunflower or pumpkin) - 1 oz has 240 mg of potassium.  Soy milk - 1 cup has 300 mg of potassium.  Spinach (cooked) - 1/2 cup has 420 mg of potassium.  Spinach (canned) -  cup has 370 mg of potassium.  Sweet potato (baked with skin) - 1 medium has 450 mg of potassium.  Swiss chard -  cup has 480 mg of potassium.  Tomato or vegetable juice -  cup has 275 mg of potassium.  Tomato (sauce or puree) -  cup has 400-550 mg of potassium.  Tomato (raw) - 1 medium has 290 mg of potassium.  Tomato (canned) -  cup has 200-300 mg of potassium.  [place] - 3 oz has 250 mg of potassium.  Wheat germ - 1 oz has 250 mg of potassium.  Winter squash -  cup has 250 mg of potassium.  Yogurt (plain or fruited) - 6 oz has 260-435 mg of potassium.  Zucchini -  cup has 220 mg of potassium. Moderate in potassium The following foods and beverages have 50-200 mg of potassium per serving:  Apple - 1 fruit has 150 mg of potassium  Apple juice -  cup has 150 mg of potassium  Applesauce -  cup has 90 mg of potassium  Apricot nectar -  cup has 140 mg of potassium  Asparagus (small spears) -  cup has 155 mg of potassium  Asparagus (large spears) - 6 have 155 mg of potassium  Bagel (cinnamon raisin) - 1 four-inch bagel has 130 mg of potassium  Bagel (egg or plain) - 1 four- inch bagel has 70 mg of potassium  Beans (green) -  cup has 90 mg of potassium  Beans (yellow) -  cup has 190 mg of potassium  Beer, regular - 12 oz has 100  mg of potassium  Beets (canned) -  cup has 125 mg of potassium  Blackberries -  cup has 115 mg of potassium  Blueberries -  cup has 60 mg of potassium  Bread (whole wheat) - 1 slice has 70 mg of potassium  Broccoli (raw) -  cup has 145 mg of potassium  Cabbage -  cup has 150 mg of potassium  Carrots (cooked or raw) -  cup has 180 mg of potassium  Cauliflower (raw) -  cup has 150 mg of potassium  Celery (raw) -  cup has 155 mg of potassium  Cereal, bran flakes -  cup has 120-150 mg of potassium  Cheese (cottage) -  cup has 110 mg of potassium  Cherries - 10 have 150 mg of potassium  Chocolate - 1 oz bar has 165 mg of potassium  Coffee (brewed) - 6 oz has 90 mg of potassium  Corn -  cup or 1 ear has 195 mg of potassium  Cucumbers -  cup has 80 mg of potassium  Egg - 1 large egg has 60 mg of potassium  Eggplant -  cup has 60 mg of potassium  Endive (raw) -  cup has 80 mg of potassium  English muffin - 1 has 65 mg of potassium  Fish (ocean perch) - 3 oz has 192 mg of potassium  Frankfurter, beef or pork - 1 has 75 mg of potassium  Fruit cocktail -  cup has 115 mg of potassium  Grape juice -  cup has 170 mg of potassium  Grapefruit -  fruit has 175 mg of potassium  Grapes -  cup has 155 mg of potassium  Greens: kale, turnip, collard -  cup has 110-150 mg of potassium  Ice cream or frozen yogurt (chocolate) -  cup has 175 mg of potassium  Ice cream or frozen yogurt (vanilla) -  cup has 120-150 mg of potassium  Lemons, limes - 1 each has 80 mg of potassium  Lettuce - 1 cup has 100 mg of potassium  Mixed vegetables -  cup has 150 mg of potassium  Mushrooms, raw -  cup has 110 mg of potassium  Nuts (walnuts, pecans, or macadamia) - 1 oz has 125 mg of potassium  Oatmeal -  cup has 80 mg of potassium  Okra -  cup has 110 mg of potassium  Onions -  cup has 120 mg of potassium  Peach - 1 has 185 mg of potassium  Peaches  (canned) -  cup has 120 mg of potassium  Pears (canned) -  cup has 120 mg of potassium  Peas, green (frozen) -  cup has 90 mg of potassium  Peppers (Green) -  cup has 130 mg of potassium  Peppers (Red) -  cup has 160 mg of potassium  Pineapple juice -  cup has 165 mg of potassium  Pineapple (fresh or canned) -  cup has 100 mg of potassium  Plums - 1 has 105 mg of potassium  Pudding, vanilla -  cup has 150 mg of potassium  Raspberries -  cup has 90 mg of potassium  Rhubarb -  cup has 115 mg of potassium  Rice, wild -  cup has 80 mg of potassium  Shrimp - 3 oz has 155 mg of potassium  Spinach (raw) - 1 cup has 170 mg of potassium  Strawberries -  cup has 125 mg of potassium  Summer squash -  cup has 175-200 mg of potassium  Swiss chard (raw) - 1 cup has  135 mg of potassium  Tangerines - 1 fruit has 140 mg of potassium  Tea, brewed - 6 oz has 65 mg of potassium  Turnips -  cup has 140 mg of potassium  Watermelon -  cup has 85 mg of potassium  Wine (Red, table) - 5 oz has 180 mg of potassium  Wine (White, table) - 5 oz 100 mg of potassium Low in potassium The following foods and beverages have less than 50 mg of potassium per serving.  Bread (white) - 1 slice has 30 mg of potassium  Carbonated beverages - 12 oz has less than 5 mg of potassium  Cheese - 1 oz has 20-30 mg of potassium  Cranberries -  cup has 45 mg of potassium  Cranberry juice cocktail -  cup has 20 mg of potassium  Fats and oils - 1 Tbsp has less than 5 mg of potassium  Hummus - 1 Tbsp has 32 mg of potassium  Nectar (papaya, mango, or pear) -  cup has 35 mg of potassium  Rice (white or brown) -  cup has 50 mg of potassium  Spaghetti or macaroni (cooked) -  cup has 30 mg of potassium  Tortilla, flour or corn - 1 has 50 mg of potassium  Waffle - 1 four-inch waffle has 50 mg of potassium  Water chestnuts -  cup has 40 mg of potassium Summary  Potassium is a  mineral found in many foods and drinks. It affects how the heart works, and helps keep fluids and minerals balanced in the body.  The amount of potassium you need each day depends on your age and any existing medical conditions you may have. Your health care provider or dietitian may recommend an amount of potassium that you should have each day. This information is not intended to replace advice given to you by your health care provider. Make sure you discuss any questions you have with your health care provider. Document Revised: 12/28/2016 Document Reviewed: 04/11/2016 Elsevier Patient Education  [place].

## 2019-10-31 ENCOUNTER — Encounter: Payer: Self-pay | Admitting: Student

## 2019-11-02 DIAGNOSIS — B351 Tinea unguium: Secondary | ICD-10-CM | POA: Diagnosis not present

## 2019-11-02 DIAGNOSIS — E1151 Type 2 diabetes mellitus with diabetic peripheral angiopathy without gangrene: Secondary | ICD-10-CM | POA: Diagnosis not present

## 2019-11-02 DIAGNOSIS — M79675 Pain in left toe(s): Secondary | ICD-10-CM | POA: Diagnosis not present

## 2019-11-02 DIAGNOSIS — M79674 Pain in right toe(s): Secondary | ICD-10-CM | POA: Diagnosis not present

## 2019-11-06 DIAGNOSIS — E876 Hypokalemia: Secondary | ICD-10-CM | POA: Diagnosis not present

## 2019-11-06 DIAGNOSIS — I129 Hypertensive chronic kidney disease with stage 1 through stage 4 chronic kidney disease, or unspecified chronic kidney disease: Secondary | ICD-10-CM | POA: Diagnosis not present

## 2019-11-06 DIAGNOSIS — E1129 Type 2 diabetes mellitus with other diabetic kidney complication: Secondary | ICD-10-CM | POA: Diagnosis not present

## 2019-11-06 DIAGNOSIS — R809 Proteinuria, unspecified: Secondary | ICD-10-CM | POA: Diagnosis not present

## 2019-11-06 DIAGNOSIS — E871 Hypo-osmolality and hyponatremia: Secondary | ICD-10-CM | POA: Diagnosis not present

## 2019-11-06 DIAGNOSIS — E1122 Type 2 diabetes mellitus with diabetic chronic kidney disease: Secondary | ICD-10-CM | POA: Diagnosis not present

## 2019-11-06 DIAGNOSIS — N189 Chronic kidney disease, unspecified: Secondary | ICD-10-CM | POA: Diagnosis not present

## 2019-11-11 ENCOUNTER — Other Ambulatory Visit: Payer: Self-pay | Admitting: Family Medicine

## 2019-11-12 ENCOUNTER — Encounter (HOSPITAL_COMMUNITY): Payer: Self-pay

## 2019-11-12 ENCOUNTER — Ambulatory Visit (INDEPENDENT_AMBULATORY_CARE_PROVIDER_SITE_OTHER): Payer: PPO | Admitting: Family Medicine

## 2019-11-12 ENCOUNTER — Encounter (HOSPITAL_COMMUNITY)
Admission: RE | Admit: 2019-11-12 | Discharge: 2019-11-12 | Disposition: A | Discharge status: Home / Self Care | Payer: PPO | Source: Ambulatory Visit | Attending: Nephrology | Admitting: Nephrology

## 2019-11-12 ENCOUNTER — Other Ambulatory Visit: Payer: Self-pay

## 2019-11-12 ENCOUNTER — Encounter: Payer: Self-pay | Admitting: Family Medicine

## 2019-11-12 ENCOUNTER — Encounter: Payer: Self-pay | Admitting: Orthopaedic Surgery

## 2019-11-12 ENCOUNTER — Ambulatory Visit: Payer: PPO | Admitting: Orthopaedic Surgery

## 2019-11-12 VITALS — BP 142/76 | HR 72 | Temp 98.3°F | Resp 20 | Ht 63.0 in | Wt 183.0 lb

## 2019-11-12 VITALS — BP 164/67 | HR 72 | Ht 63.0 in | Wt 180.0 lb

## 2019-11-12 DIAGNOSIS — Z794 Long term (current) use of insulin: Secondary | ICD-10-CM

## 2019-11-12 DIAGNOSIS — N184 Chronic kidney disease, stage 4 (severe): Secondary | ICD-10-CM | POA: Diagnosis not present

## 2019-11-12 DIAGNOSIS — M1A0790 Idiopathic chronic gout, unspecified ankle and foot, without tophus (tophi): Secondary | ICD-10-CM | POA: Diagnosis not present

## 2019-11-12 DIAGNOSIS — E1122 Type 2 diabetes mellitus with diabetic chronic kidney disease: Secondary | ICD-10-CM

## 2019-11-12 DIAGNOSIS — G8929 Other chronic pain: Secondary | ICD-10-CM

## 2019-11-12 DIAGNOSIS — I10 Essential (primary) hypertension: Secondary | ICD-10-CM | POA: Diagnosis not present

## 2019-11-12 DIAGNOSIS — E0849 Diabetes mellitus due to underlying condition with other diabetic neurological complication: Secondary | ICD-10-CM | POA: Diagnosis not present

## 2019-11-12 DIAGNOSIS — E669 Obesity, unspecified: Secondary | ICD-10-CM

## 2019-11-12 DIAGNOSIS — M5442 Lumbago with sciatica, left side: Secondary | ICD-10-CM | POA: Diagnosis not present

## 2019-11-12 DIAGNOSIS — G4730 Sleep apnea, unspecified: Secondary | ICD-10-CM

## 2019-11-12 DIAGNOSIS — D631 Anemia in chronic kidney disease: Secondary | ICD-10-CM | POA: Insufficient documentation

## 2019-11-12 DIAGNOSIS — N1832 Chronic kidney disease, stage 3b: Secondary | ICD-10-CM | POA: Diagnosis not present

## 2019-11-12 DIAGNOSIS — E785 Hyperlipidemia, unspecified: Secondary | ICD-10-CM | POA: Diagnosis not present

## 2019-11-12 DIAGNOSIS — E781 Pure hyperglyceridemia: Secondary | ICD-10-CM | POA: Diagnosis not present

## 2019-11-12 DIAGNOSIS — R0902 Hypoxemia: Secondary | ICD-10-CM

## 2019-11-12 LAB — RENAL FUNCTION PANEL
Albumin: 3.7 g/dL (ref 3.5–5.0)
Anion gap: 14 (ref 5–15)
BUN: 69 mg/dL — ABNORMAL HIGH (ref 8–23)
CO2: 28 mmol/L (ref 22–32)
Calcium: 9.8 mg/dL (ref 8.9–10.3)
Chloride: 94 mmol/L — ABNORMAL LOW (ref 98–111)
Creatinine, Ser: 1.97 mg/dL — ABNORMAL HIGH (ref 0.44–1.00)
GFR, Estimated: 25 mL/min — ABNORMAL LOW (ref >60)
Glucose, Bld: 124 mg/dL — ABNORMAL HIGH (ref 70–99)
Phosphorus: 2.9 mg/dL (ref 2.5–4.6)
Potassium: 3.4 mmol/L — ABNORMAL LOW (ref 3.5–5.1)
Sodium: 136 mmol/L (ref 135–145)

## 2019-11-12 LAB — CBC
HCT: 34.7 % — ABNORMAL LOW (ref 36.0–46.0)
Hemoglobin: 11 g/dL — ABNORMAL LOW (ref 12.0–15.0)
MCH: 28.6 pg (ref 26.0–34.0)
MCHC: 31.7 g/dL (ref 30.0–36.0)
MCV: 90.1 fL (ref 80.0–100.0)
Platelets: 265 10*3/uL (ref 150–400)
RBC: 3.85 MIL/uL — ABNORMAL LOW (ref 3.87–5.11)
RDW: 19.8 % — ABNORMAL HIGH (ref 11.5–15.5)
WBC: 10 10*3/uL (ref 4.0–10.5)
nRBC: 0 % (ref 0.0–0.2)

## 2019-11-12 LAB — PROTEIN / CREATININE RATIO, URINE
Creatinine, Urine: 79.43 mg/dL
Protein Creatinine Ratio: 0.77 mg/mg{Cre} — ABNORMAL HIGH (ref 0.00–0.15)
Total Protein, Urine: 61 mg/dL

## 2019-11-12 LAB — URIC ACID: Uric Acid, Serum: 6.7 mg/dL (ref 2.5–7.1)

## 2019-11-12 MED ORDER — EZETIMIBE 10 MG PO TABS: 10.0000 mg | ORAL_TABLET | Freq: Every day | ORAL | 3 refills | Status: AC

## 2019-11-12 MED ORDER — HYDROCODONE-ACETAMINOPHEN 5-325 MG PO TABS: ORAL_TABLET | ORAL | 0 refills | Status: DC

## 2019-11-12 MED ORDER — EPOETIN ALFA 3000 UNIT/ML IJ SOLN: 3000.0000 [IU] | Freq: Once | INTRAMUSCULAR | Status: DC

## 2019-11-12 NOTE — Patient Instructions (Addendum)
Annual physical exam with MD end January, call if you need me before   Flu vaccine today  Medication to continue as before  Fasting lipid,cmp an EGFr, tSH and vit D 5 days before January visit  Thanks for choosing [facility], we consider it a privelige to serve you.

## 2019-11-12 NOTE — Progress Notes (Signed)
lip

## 2019-11-12 NOTE — Progress Notes (Signed)
She brings her husband in today and we had a good 30 to 18 meeting.  She thinks she is now allergic to allopurinol.  She brings lists of systems that hurt:  Yellow eyes at time, red and irritated eyes, change in eye sight, swelling in the legs and feet, joint pain, coughing, kidney problems, easy bruising.  She has been on the allopurinol since 2012.  I told her to stop the allopurinol.  She also has diabetes mellitus insulin dependent.  I have told her many of her symptoms relate to complications of the diabetes.  She has peripheral neuropathy and her feet problems are related to that.  I spent a long time explaining how her diabetes may affect her overall.  I have explained peripheral neuropathy.  Her husband and her got into a verbal match about her problem but resolved with my intervention.  They appear to have a better understanding of the situation.  I will see her in two weeks after being off the allopurinol.  She needs to see eye doctor and also the doctor who treats her diabetes.  She was taken off the Neurontin and may need to be placed back on it or Lyrica.  Return in two weeks.  Encounter Diagnoses  Name Primary?  . 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 Chronic left-sided low back pain with left-sided sciatica   . Chronic idiopathic gout involving toe without tophus, unspecified laterality    Call if any problem.  Precautions discussed.   Electronically Signed Sanjuana Kava, MD 10/14/202111:14 AM

## 2019-11-13 LAB — POCT HEMOGLOBIN-HEMACUE: Hemoglobin: 11 g/dL — ABNORMAL LOW (ref 12.0–15.0)

## 2019-11-14 ENCOUNTER — Encounter: Payer: Self-pay | Admitting: Family Medicine

## 2019-11-14 NOTE — Progress Notes (Signed)
Krista Strickland     MRN: [id number]      DOB: 03/15/1950   HPI Krista Strickland is here for follow up and re-evaluation of chronic medical conditions, medication management and review of any available recent lab and radiology data.  Preventive health is updated, specifically  Cancer screening and Immunization.   Questions or concerns regarding consultations or procedures which the PT has had in the interim are  addressed. The PT denies any adverse reactions to current medications since the last visit.  There are no new concerns.  There are no specific complaints   ROS Denies recent fever or chills. Denies sinus pressure, nasal congestion, ear pain or sore throat. Denies chest congestion, productive cough or wheezing. Denies chest pains, palpitations and leg swelling Denies abdominal pain, nausea, vomiting,diarrhea or constipation.   Denies dysuria, frequency, hesitancy or incontinence. Denies joint pain, swelling and limitation in mobility. Denies headaches, seizures, numbness, or tingling. Denies depression, anxiety or insomnia. Denies skin break down or rash.   PE  BP (!) 142/76   Pulse 72   Temp 98.3 F (36.8 C)   Resp 20   Ht 5\' 3"  (1.6 m)   Wt 183 lb (83 kg)   SpO2 96%   BMI 32.42 kg/m     Patient alert and oriented and in no cardiopulmonary distress.  HEENT: No facial asymmetry, EOMI,     Neck supple .  Chest: Clear to auscultation bilaterally.  CVS: S1, S2 no murmurs, no S3.Regular rate.  ABD: Soft non tender.   Ext: No edema  JK:[id number]  ROM spine, shoulders, hips and knees.  Skin: Intact, no ulcerations or rash noted.  Psych: Good eye contact, normal affect. Memory intact not anxious or depressed appearing.  CNS: CN 2-12 intact, power,  normal throughout.no focal deficits noted.   Assessment & Plan  Hyperlipidemia LDL goal <100 Hyperlipidemia:Low fat diet discussed and encouraged.   Lipid Panel  Lab Results  Component Value Date   CHOL  155 02/11/2019   HDL 32 (L) 02/11/2019   LDLCALC 98 02/11/2019   TRIG 150 (H) 02/11/2019   CHOLHDL 4.8 02/11/2019  Updated lab needed at/ before next visit. Needs to increase exercise and reduce fat in diet     Essential hypertension Sub optiaml control, no med change DASH diet and commitment to daily physical activity for a minimum of 30 minutes discussed and encouraged, as a part of hypertension management. The importance of attaining a healthy weight is also discussed.  BP/Weight 11/12/2019 11/12/2019 11/12/2019 10/30/2019 10/29/2019 3/71/6967 09/06/3808  Systolic BP 175 102 585 277 824 235 361  Diastolic BP 65 76 67 58 76 60 63  Wt. (Lbs) - 183 180 182.6 - 177.8 -  BMI - 32.42 31.89 32.35 - 31.5 -       Type 2 diabetes mellitus with diabetic chronic kidney disease (HCC) Controlled, no change in medication Krista Strickland is reminded of the importance of commitment to daily physical activity for 30 minutes or more, as able and the need to limit carbohydrate intake to 30 to 60 grams per meal to help with blood sugar control.   The need to take medication as prescribed, test blood sugar as directed, and to call between visits if there is a concern that blood sugar is uncontrolled is also discussed.   Krista Strickland is reminded of the importance of daily foot exam, annual eye examination, and good blood sugar, blood pressure and cholesterol control.  Diabetic Labs Latest Ref  Rng & Units 11/12/2019 10/29/2019 10/20/2019 09/23/2019 09/17/2019  HbA1c 4.0 - 5.6 % - - 5.9(A) - -  Microalbumin Not Estab. ug/mL - - - - -  Micro/Creat Ratio 0.0 - 30.0 mg/g creat - - - - -  Chol <200 mg/dL - - - - -  HDL > OR = 50 mg/dL - - - - -  Calc LDL mg/dL (calc) - - - - -  Triglycerides <150 mg/dL - - - - -  Creatinine 0.44 - 1.00 mg/dL 1.97(H) 1.71(H) - 1.85(H) 2.10(H)  GFR >60.00 mL/min - - - - -   BP/Weight 11/12/2019 11/12/2019 11/12/2019 10/30/2019 10/29/2019 9/92/4268 04/01/1960  Systolic BP 229 798  921 194 174 081 448  Diastolic BP 65 76 67 58 76 60 63  Wt. (Lbs) - 183 180 182.6 - 177.8 -  BMI - 32.42 31.89 32.35 - 31.5 -   Foot/eye exam completion dates Latest Ref Rng & Units 10/30/2019 10/20/2019  Eye Exam No Retinopathy No Retinopathy -  Foot exam Order - - -  Foot Form Completion - - Done        Obesity (BMI 30.0-34.9)  Patient re-educated about  the importance of commitment to a  minimum of 150 minutes of exercise per week as able.  The importance of healthy food choices with portion control discussed, as well as eating regularly and within a 12 hour window most days. The need to choose "clean , green" food 50 to 75% of the time is discussed, as well as to make water the primary drink and set a goal of 64 ounces water daily.    Weight /BMI 11/12/2019 11/12/2019 10/30/2019  WEIGHT 183 lb 180 lb 182 lb 9.6 oz  HEIGHT 5\' 3"  5\' 3"  5\' 3"   BMI 32.42 kg/m2 31.89 kg/m2 32.35 kg/m2

## 2019-11-14 NOTE — Assessment & Plan Note (Signed)
Sub optiaml control, no med change DASH diet and commitment to daily physical activity for a minimum of 30 minutes discussed and encouraged, as a part of hypertension management. The importance of attaining a healthy weight is also discussed.  BP/Weight 11/12/2019 11/12/2019 11/12/2019 10/30/2019 10/29/2019 5/88/3254 10/07/2639  Systolic BP 583 094 076 808 811 031 594  Diastolic BP 65 76 67 58 76 60 63  Wt. (Lbs) - 183 180 182.6 - 177.8 -  BMI - 32.42 31.89 32.35 - 31.5 -

## 2019-11-14 NOTE — Assessment & Plan Note (Signed)
  Patient re-educated about  the importance of commitment to a  minimum of 150 minutes of exercise per week as able.  The importance of healthy food choices with portion control discussed, as well as eating regularly and within a 12 hour window most days. The need to choose "clean , green" food 50 to 75% of the time is discussed, as well as to make water the primary drink and set a goal of 64 ounces water daily.    Weight /BMI 11/12/2019 11/12/2019 10/30/2019  WEIGHT 183 lb 180 lb 182 lb 9.6 oz  HEIGHT 5\' 3"  5\' 3"  5\' 3"   BMI 32.42 kg/m2 31.89 kg/m2 32.35 kg/m2

## 2019-11-14 NOTE — Assessment & Plan Note (Signed)
Controlled, no change in medication Krista Strickland is reminded of the importance of commitment to daily physical activity for 30 minutes or more, as able and the need to limit carbohydrate intake to 30 to 60 grams per meal to help with blood sugar control.   The need to take medication as prescribed, test blood sugar as directed, and to call between visits if there is a concern that blood sugar is uncontrolled is also discussed.   Krista Strickland is reminded of the importance of daily foot exam, annual eye examination, and good blood sugar, blood pressure and cholesterol control.  Diabetic Labs Latest Ref Rng & Units 11/12/2019 10/29/2019 10/20/2019 09/23/2019 09/17/2019  HbA1c 4.0 - 5.6 % - - 5.9(A) - -  Microalbumin Not Estab. ug/mL - - - - -  Micro/Creat Ratio 0.0 - 30.0 mg/g creat - - - - -  Chol <200 mg/dL - - - - -  HDL > OR = 50 mg/dL - - - - -  Calc LDL mg/dL (calc) - - - - -  Triglycerides <150 mg/dL - - - - -  Creatinine 0.44 - 1.00 mg/dL 1.97(H) 1.71(H) - 1.85(H) 2.10(H)  GFR >60.00 mL/min - - - - -   BP/Weight 11/12/2019 11/12/2019 11/12/2019 10/30/2019 10/29/2019 7/94/3276 02/01/7090  Systolic BP 957 473 403 709 643 838 184  Diastolic BP 65 76 67 58 76 60 63  Wt. (Lbs) - 183 180 182.6 - 177.8 -  BMI - 32.42 31.89 32.35 - 31.5 -   Foot/eye exam completion dates Latest Ref Rng & Units 10/30/2019 10/20/2019  Eye Exam No Retinopathy No Retinopathy -  Foot exam Order - - -  Foot Form Completion - - Done

## 2019-11-14 NOTE — Assessment & Plan Note (Signed)
Hyperlipidemia:Low fat diet discussed and encouraged.   Lipid Panel  Lab Results  Component Value Date   CHOL 155 02/11/2019   HDL 32 (L) 02/11/2019   LDLCALC 98 02/11/2019   TRIG 150 (H) 02/11/2019   CHOLHDL 4.8 02/11/2019  Updated lab needed at/ before next visit. Needs to increase exercise and reduce fat in diet

## 2019-11-24 ENCOUNTER — Other Ambulatory Visit: Payer: Self-pay | Admitting: Family Medicine

## 2019-11-26 ENCOUNTER — Other Ambulatory Visit: Payer: Self-pay | Admitting: Family Medicine

## 2019-11-26 ENCOUNTER — Encounter (HOSPITAL_COMMUNITY)
Admission: RE | Admit: 2019-11-26 | Discharge: 2019-11-26 | Disposition: A | Discharge status: Home / Self Care | Payer: PPO | Source: Ambulatory Visit | Attending: Nephrology | Admitting: Nephrology

## 2019-11-26 ENCOUNTER — Encounter (HOSPITAL_COMMUNITY): Payer: Self-pay

## 2019-11-26 ENCOUNTER — Encounter: Payer: Self-pay | Admitting: Orthopaedic Surgery

## 2019-11-26 ENCOUNTER — Other Ambulatory Visit: Payer: Self-pay

## 2019-11-26 ENCOUNTER — Ambulatory Visit: Payer: PPO | Admitting: Orthopaedic Surgery

## 2019-11-26 VITALS — BP 165/71 | HR 59 | Ht 63.0 in | Wt 175.0 lb

## 2019-11-26 DIAGNOSIS — M1A0790 Idiopathic chronic gout, unspecified ankle and foot, without tophus (tophi): Secondary | ICD-10-CM

## 2019-11-26 DIAGNOSIS — E0849 Diabetes mellitus due to underlying condition with other diabetic neurological complication: Secondary | ICD-10-CM | POA: Diagnosis not present

## 2019-11-26 DIAGNOSIS — N184 Chronic kidney disease, stage 4 (severe): Secondary | ICD-10-CM | POA: Diagnosis not present

## 2019-11-26 LAB — POCT HEMOGLOBIN-HEMACUE: Hemoglobin: 11.8 g/dL — ABNORMAL LOW (ref 12.0–15.0)

## 2019-11-26 MED ORDER — EPOETIN ALFA 3000 UNIT/ML IJ SOLN: 3000.0000 [IU] | Freq: Once | INTRAMUSCULAR | Status: DC

## 2019-11-26 NOTE — Progress Notes (Signed)
Patient IR:Krista Strickland, female DOB:[birth year], 69 y.o. ZYS:[id number]  Chief Complaint  Patient presents with  . Foot Pain    still having some pain but some better.    HPI  Krista Strickland is a 69 y.o. female who has gout and had potential problems with the allopurinol.  She felt the medicine was making her worse.  She stopped it several weeks ago on my direction.  She has no change in the symptoms she thought was related to the medicine.  I told her I would doubt the allopurinol has caused this.  She has kidney problems and has a low potassium.  I have told her about Uloric but before I change her to this, I will give no new medicine until she can get her potassium better regulated. She and her husband who is present agree.   Body mass index is 31 kg/m.  ROS  Review of Systems  Constitutional: Positive for activity change.  Musculoskeletal: Positive for arthralgias, back pain, gait problem and joint swelling.  Neurological: Tremors:  .wkl.  All other systems reviewed and are negative.   All other systems reviewed and are negative.  The following is a summary of the past history medically, past history surgically, known current medicines, social history and family history.  This information is gathered electronically by the computer from prior information and documentation.  I review this each visit and have found including this information at this point in the chart is beneficial and informative.    Past Medical History:  Diagnosis Date  . Anemia   . Arthritis   . CKD (chronic kidney disease) stage 3, GFR 30-59 ml/min (HCC)   . Diabetes mellitus, type 2 ([place])   . Essential hypertension   . GERD (gastroesophageal reflux disease)   . Gout   . History of MRSA infection 03/2009  . History of stroke 2013  . HOCM (hypertrophic obstructive cardiomyopathy) ([place])   . Hypercalcemia 2017   Managed by nephrology  . Hyperlipidemia   . Obesity   . Psoriasis   . Uterine cancer  ([place])   . 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 ABDOMINAL HYSTERECTOMY    . APPENDECTOMY  2012  . COLONOSCOPY WITH PROPOFOL N/A 08/24/2016   Procedure: COLONOSCOPY WITH PROPOFOL;  Surgeon: Rogene Houston, MD;  [facility];  Service: Endoscopy;  Laterality: N/A;  10:30  . Excison of right breast cyst    . LAPAROSCOPIC SALPINGO OOPHERECTOMY Right 04/22/2012   Procedure: LAPAROSCOPIC SALPINGO OOPHORECTOMY;  Surgeon: Jonnie Kind, MD;  Location: [facility];  Service: Gynecology;  Laterality: Right;  end 11:17  . MASS EXCISION N/A 04/22/2012   Procedure: EXCISION SKIN TAGS NECK AND HEAD;  Surgeon: Jonnie Kind, MD;  Location: [facility];  Service: Gynecology;  Laterality: N/A;  start 11:19  . PARTIAL HYSTERECTOMY    . POLYPECTOMY  08/24/2016   Procedure: POLYPECTOMY;  Surgeon: Rogene Houston, MD;  [facility];  Service: Endoscopy;;  colon  . Right neck biopsy      Family History  Problem Relation Age of Onset  . Hypertension Brother   . Gout Brother   . Prostate cancer Brother   . Hypertension Brother   . Prostate cancer Brother   . Gout Brother   . Hypertension Sister   . Gout Sister   . Leukemia Sister 60  . Pancreatic cancer Sister 74  .  Gout Sister   . Prostate cancer Brother   . Diabetes Neg Hx     Social History Social History   Tobacco Use  . Smoking status: Former Smoker    Packs/day: 0.25    Years: 12.00    Pack years: 3.00    Quit date: 07/16/1978    Years since quitting: 41.3  . Smokeless tobacco: Never Used  Vaping Use  . Vaping Use: Never used  Substance Use Topics  . Alcohol use: No  . Drug use: No    Allergies  Allergen Reactions  . Benazepril Swelling  . Metronidazole Hives  . Mobic [Meloxicam] Hives  . Penicillins Hives and Swelling  . Sulfonamide Derivatives Hives    Current Outpatient Medications  Medication Sig Dispense Refill  . aspirin 325 MG tablet Take 1  tablet (325 mg total) by mouth every morning. 30 tablet 0  . DILT-XR 180 MG 24 hr capsule Take 1 capsule by mouth once daily 90 capsule 0  . docusate sodium (COLACE) 100 MG capsule Take 100 mg by mouth daily as needed for mild constipation or moderate constipation.     Marland Kitchen epoetin alfa (EPOGEN) 3000 UNIT/ML injection Inject 3,000 Units into the vein every 14 (fourteen) days.     Marland Kitchen ezetimibe (ZETIA) 10 MG tablet Take 1 tablet (10 mg total) by mouth daily. 90 tablet 3  . hydrALAZINE (APRESOLINE) 25 MG tablet TAKE 1 TABLET BY MOUTH TWICE DAILY AT 9 AM AND 9  PM 180 tablet 0  . HYDROcodone-acetaminophen (NORCO/VICODIN) 5-325 MG tablet One tablet every six hours for pain.  Limit 7 days. 28 tablet 0  . insulin NPH-regular Human (NOVOLIN 70/30 RELION) (70-30) 100 UNIT/ML injection 90 units in the morning 70 mL 0  . metolazone (ZAROXOLYN) 5 MG tablet Take 1 tablet by mouth once daily 90 tablet 0  . metoprolol tartrate (LOPRESSOR) 50 MG tablet Take 1 tablet (50 mg total) by mouth 2 (two) times daily. (Patient taking differently: Take 25 mg by mouth 2 (two) times daily. ) 30 tablet 1  . midodrine (PROAMATINE) 5 MG tablet Take 5 mg by mouth 3 (three) times daily.    . potassium chloride (KLOR-CON) 10 MEQ tablet TAKE 3 TABLETS BY MOUTH THREE TIMES DAILY (Patient taking differently: 3 (three) times daily. 4 Tablets Three times Daily) 270 tablet 1  . pravastatin (PRAVACHOL) 20 MG tablet TAKE 1 TABLET BY MOUTH ONCE DAILY BEFORE BREAKFAST 90 tablet 1  . torsemide (DEMADEX) 20 MG tablet Take 2 tablets (40 mg total) by mouth 2 (two) times daily. 56 tablet 0   No current facility-administered medications for this visit.     Physical Exam  Blood pressure (!) 165/71, pulse (!) 59, height 5\' 3"  (1.6 m), weight 175 lb (79.4 kg).  Constitutional: overall normal hygiene, normal nutrition, well developed, normal grooming, normal body habitus. Assistive device:none  Musculoskeletal: gait and station Limp none, muscle  tone and strength are normal, no tremors or atrophy is present.  .  Neurological: coordination overall normal.  Deep tendon reflex/nerve stretch intact.  Sensation normal.  Cranial nerves II-XII intact.   Skin:   Normal overall no scars, lesions, ulcers or rashes. No psoriasis.  Psychiatric: Alert and oriented x 3.  Recent memory intact, remote memory unclear.  Normal mood and affect. Well groomed.  Good eye contact.  Cardiovascular: overall no swelling, no varicosities, no edema bilaterally, normal temperatures of the legs and arms, no clubbing, cyanosis and good capillary refill.  Lymphatic: palpation  is normal.  She has no swelling of any joint today.  All other systems reviewed and are negative   The patient has been educated about the nature of the problem(s) and counseled on treatment options.  The patient appeared to understand what I have discussed and is in agreement with it.  Encounter Diagnoses  Name Primary?  . Chronic idiopathic gout involving toe without tophus, unspecified laterality Yes  . 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  Precautions discussed.  Continue current medications.   Return to clinic five weeks.   Electronically Signed Sanjuana Kava, MD 10/28/20219:33 AM

## 2019-11-27 DIAGNOSIS — D631 Anemia in chronic kidney disease: Secondary | ICD-10-CM | POA: Diagnosis not present

## 2019-11-27 DIAGNOSIS — E1129 Type 2 diabetes mellitus with other diabetic kidney complication: Secondary | ICD-10-CM | POA: Diagnosis not present

## 2019-11-27 DIAGNOSIS — E876 Hypokalemia: Secondary | ICD-10-CM | POA: Diagnosis not present

## 2019-11-27 DIAGNOSIS — I129 Hypertensive chronic kidney disease with stage 1 through stage 4 chronic kidney disease, or unspecified chronic kidney disease: Secondary | ICD-10-CM | POA: Diagnosis not present

## 2019-11-27 DIAGNOSIS — R809 Proteinuria, unspecified: Secondary | ICD-10-CM | POA: Diagnosis not present

## 2019-11-27 DIAGNOSIS — N189 Chronic kidney disease, unspecified: Secondary | ICD-10-CM | POA: Diagnosis not present

## 2019-11-27 DIAGNOSIS — E1122 Type 2 diabetes mellitus with diabetic chronic kidney disease: Secondary | ICD-10-CM | POA: Diagnosis not present

## 2019-12-01 DIAGNOSIS — H35033 Hypertensive retinopathy, bilateral: Secondary | ICD-10-CM | POA: Diagnosis not present

## 2019-12-01 DIAGNOSIS — H1820 Unspecified corneal edema: Secondary | ICD-10-CM | POA: Diagnosis not present

## 2019-12-01 DIAGNOSIS — H20022 Recurrent acute iridocyclitis, left eye: Secondary | ICD-10-CM | POA: Diagnosis not present

## 2019-12-01 DIAGNOSIS — E119 Type 2 diabetes mellitus without complications: Secondary | ICD-10-CM | POA: Diagnosis not present

## 2019-12-01 DIAGNOSIS — H2513 Age-related nuclear cataract, bilateral: Secondary | ICD-10-CM | POA: Diagnosis not present

## 2019-12-04 DIAGNOSIS — N189 Chronic kidney disease, unspecified: Secondary | ICD-10-CM | POA: Diagnosis not present

## 2019-12-04 DIAGNOSIS — D631 Anemia in chronic kidney disease: Secondary | ICD-10-CM | POA: Diagnosis not present

## 2019-12-04 DIAGNOSIS — E1129 Type 2 diabetes mellitus with other diabetic kidney complication: Secondary | ICD-10-CM | POA: Diagnosis not present

## 2019-12-04 DIAGNOSIS — E1122 Type 2 diabetes mellitus with diabetic chronic kidney disease: Secondary | ICD-10-CM | POA: Diagnosis not present

## 2019-12-04 DIAGNOSIS — E876 Hypokalemia: Secondary | ICD-10-CM | POA: Diagnosis not present

## 2019-12-04 DIAGNOSIS — I129 Hypertensive chronic kidney disease with stage 1 through stage 4 chronic kidney disease, or unspecified chronic kidney disease: Secondary | ICD-10-CM | POA: Diagnosis not present

## 2019-12-04 DIAGNOSIS — R809 Proteinuria, unspecified: Secondary | ICD-10-CM | POA: Diagnosis not present

## 2019-12-10 DIAGNOSIS — N189 Chronic kidney disease, unspecified: Secondary | ICD-10-CM | POA: Diagnosis not present

## 2019-12-10 DIAGNOSIS — I129 Hypertensive chronic kidney disease with stage 1 through stage 4 chronic kidney disease, or unspecified chronic kidney disease: Secondary | ICD-10-CM | POA: Diagnosis not present

## 2019-12-10 DIAGNOSIS — E1129 Type 2 diabetes mellitus with other diabetic kidney complication: Secondary | ICD-10-CM | POA: Diagnosis not present

## 2019-12-10 DIAGNOSIS — E1122 Type 2 diabetes mellitus with diabetic chronic kidney disease: Secondary | ICD-10-CM | POA: Diagnosis not present

## 2019-12-10 DIAGNOSIS — D631 Anemia in chronic kidney disease: Secondary | ICD-10-CM | POA: Diagnosis not present

## 2019-12-10 DIAGNOSIS — E876 Hypokalemia: Secondary | ICD-10-CM | POA: Diagnosis not present

## 2019-12-10 DIAGNOSIS — R809 Proteinuria, unspecified: Secondary | ICD-10-CM | POA: Diagnosis not present

## 2019-12-11 ENCOUNTER — Encounter (HOSPITAL_COMMUNITY)
Admission: RE | Admit: 2019-12-11 | Discharge: 2019-12-11 | Disposition: A | Discharge status: Home / Self Care | Payer: PPO | Source: Ambulatory Visit | Attending: Nephrology | Admitting: Nephrology

## 2019-12-11 ENCOUNTER — Encounter (HOSPITAL_COMMUNITY): Payer: Self-pay

## 2019-12-11 ENCOUNTER — Other Ambulatory Visit: Payer: Self-pay

## 2019-12-11 DIAGNOSIS — N184 Chronic kidney disease, stage 4 (severe): Secondary | ICD-10-CM | POA: Insufficient documentation

## 2019-12-11 DIAGNOSIS — R809 Proteinuria, unspecified: Secondary | ICD-10-CM | POA: Diagnosis not present

## 2019-12-11 DIAGNOSIS — E1122 Type 2 diabetes mellitus with diabetic chronic kidney disease: Secondary | ICD-10-CM | POA: Diagnosis not present

## 2019-12-11 DIAGNOSIS — I129 Hypertensive chronic kidney disease with stage 1 through stage 4 chronic kidney disease, or unspecified chronic kidney disease: Secondary | ICD-10-CM | POA: Diagnosis not present

## 2019-12-11 DIAGNOSIS — N189 Chronic kidney disease, unspecified: Secondary | ICD-10-CM | POA: Diagnosis not present

## 2019-12-11 DIAGNOSIS — E1129 Type 2 diabetes mellitus with other diabetic kidney complication: Secondary | ICD-10-CM | POA: Diagnosis not present

## 2019-12-11 DIAGNOSIS — D631 Anemia in chronic kidney disease: Secondary | ICD-10-CM | POA: Diagnosis not present

## 2019-12-11 DIAGNOSIS — E876 Hypokalemia: Secondary | ICD-10-CM | POA: Diagnosis not present

## 2019-12-11 LAB — RENAL FUNCTION PANEL
Albumin: 3.7 g/dL (ref 3.5–5.0)
Anion gap: 13 (ref 5–15)
BUN: 82 mg/dL — ABNORMAL HIGH (ref 8–23)
CO2: 27 mmol/L (ref 22–32)
Calcium: 9.8 mg/dL (ref 8.9–10.3)
Chloride: 92 mmol/L — ABNORMAL LOW (ref 98–111)
Creatinine, Ser: 2 mg/dL — ABNORMAL HIGH (ref 0.44–1.00)
GFR, Estimated: 27 mL/min — ABNORMAL LOW (ref >60)
Glucose, Bld: 174 mg/dL — ABNORMAL HIGH (ref 70–99)
Phosphorus: 3.2 mg/dL (ref 2.5–4.6)
Potassium: 3 mmol/L — ABNORMAL LOW (ref 3.5–5.1)
Sodium: 132 mmol/L — ABNORMAL LOW (ref 135–145)

## 2019-12-11 LAB — POCT HEMOGLOBIN-HEMACUE: Hemoglobin: 11.5 g/dL — ABNORMAL LOW (ref 12.0–15.0)

## 2019-12-11 MED ORDER — EPOETIN ALFA 3000 UNIT/ML IJ SOLN: 3000.0000 [IU] | Freq: Once | INTRAMUSCULAR | Status: DC

## 2019-12-16 ENCOUNTER — Telehealth: Payer: Self-pay | Admitting: Orthopaedic Surgery

## 2019-12-16 MED ORDER — HYDROCODONE-ACETAMINOPHEN 5-325 MG PO TABS
ORAL_TABLET | ORAL | 0 refills | Status: DC
Start: 2019-12-16 — End: 2019-12-31

## 2019-12-16 NOTE — Telephone Encounter (Signed)
Patient requests refill on Hydrocodone/Acetaminophen 5-325  Mgs.  Qty  28  Sig: One tablet every six hours for pain. Limit 7 days.  Patient states she uses Product[facility]process development scientist in [place]

## 2019-12-21 ENCOUNTER — Ambulatory Visit: Payer: PPO | Admitting: Endocrinology

## 2019-12-21 ENCOUNTER — Other Ambulatory Visit: Payer: Self-pay

## 2019-12-21 VITALS — BP 140/62 | HR 62 | Ht 65.0 in | Wt 180.0 lb

## 2019-12-21 DIAGNOSIS — E1165 Type 2 diabetes mellitus with hyperglycemia: Secondary | ICD-10-CM

## 2019-12-21 DIAGNOSIS — Z794 Long term (current) use of insulin: Secondary | ICD-10-CM | POA: Diagnosis not present

## 2019-12-21 LAB — POCT GLYCOSYLATED HEMOGLOBIN (HGB A1C): Hemoglobin A1C: 6.8 % — AB (ref 4.0–5.6)

## 2019-12-21 MED ORDER — NOVOLIN 70/30 RELION (70-30) 100 UNIT/ML ~~LOC~~ SUSP
90.0000 [IU] | Freq: Every day | SUBCUTANEOUS | 3 refills | Status: DC
Start: 2019-12-21 — End: 2020-01-07

## 2019-12-21 NOTE — Progress Notes (Signed)
Subjective:    Patient ID: Krista Strickland, female    DOB: April 12, 1950, 69 y.o.   MRN: [id number]  HPI Pt returns for f/u of diabetes.   DM type: insulin-requiring type 2 Dx'ed: 2841 Complications: PN, stage 4 CRI and CVA.  Therapy: insulin since 2010 GDM: never DKA: never Severe hypoglycemia: never.   Pancreatitis: never.   SDOH: she cannot afford insulin analogs Other: she declines weight loss surgery; she had cutaneous reactions to NPH and lantus; she has declined MDI's. so we had to try 70/30 (the NPH component caused no reaction this time).  She takes 70/30, due to the pattern of cbg's; fructosamine converts to A1c 0.6 higher than A1c itself Interval history:  no cbg record, but states cbg's vary from 140-201.  She checks fasting only.  pt states she feels well in general.  She says she never misses the insulin.  no recent steroids. She takes 90 units qam.   Pt also has small multinodular goiter (euthyroid; f/u US in 2017 showed no change, with no thyroid nodule meeting criteria for biopsy or surveillance; she is euthyroid off rx).   Past Medical History:  Diagnosis Date  . Anemia   . Arthritis   . CKD (chronic kidney disease) stage 3, GFR 30-59 ml/min (HCC)   . Diabetes mellitus, type 2 ([place])   . Essential hypertension   . GERD (gastroesophageal reflux disease)   . Gout   . History of MRSA infection 03/2009  . History of stroke 2013  . HOCM (hypertrophic obstructive cardiomyopathy) ([place])   . Hypercalcemia 2017   Managed by nephrology  . Hyperlipidemia   . Obesity   . Psoriasis   . Uterine cancer ([place])   . 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 ABDOMINAL HYSTERECTOMY    . APPENDECTOMY  2012  . COLONOSCOPY WITH PROPOFOL N/A 08/24/2016   Procedure: COLONOSCOPY WITH PROPOFOL;  Surgeon: Rogene Houston, MD;  [facility];  Service: Endoscopy;  Laterality: N/A;  10:30  . Excison of right breast cyst    .  LAPAROSCOPIC SALPINGO OOPHERECTOMY Right 04/22/2012   Procedure: LAPAROSCOPIC SALPINGO OOPHORECTOMY;  Surgeon: Jonnie Kind, MD;  Location: [facility];  Service: Gynecology;  Laterality: Right;  end 11:17  . MASS EXCISION N/A 04/22/2012   Procedure: EXCISION SKIN TAGS NECK AND HEAD;  Surgeon: Jonnie Kind, MD;  Location: [facility];  Service: Gynecology;  Laterality: N/A;  start 11:19  . PARTIAL HYSTERECTOMY    . POLYPECTOMY  08/24/2016   Procedure: POLYPECTOMY;  Surgeon: Rogene Houston, MD;  [facility];  Service: Endoscopy;;  colon  . Right neck biopsy      Social History   Socioeconomic History  . Marital status: Married    Spouse name: Not on file  . Number of children: 1  . Years of education: Not on file  . Highest education level: 11th grade  Occupational History  . Occupation: disabled     Fish farm manager: UNEMPLOYED  Tobacco Use  . Smoking status: Former Smoker    Packs/day: 0.25    Years: 12.00    Pack years: 3.00    Quit date: 07/16/1978    Years since quitting: 41.4  . Smokeless tobacco: Never Used  Vaping Use  . Vaping Use: Never used  Substance and Sexual Activity  . Alcohol use: No  . Drug use: No  . Sexual activity: Yes  Birth control/protection: Surgical  Other Topics Concern  . Not on file  Social History Narrative  . Not on file   Social Determinants of Health   Financial Resource Strain:   . Difficulty of Paying Living Expenses: Not on file  Food Insecurity:   . Worried About Charity fundraiser in the Last Year: Not on file  . Ran Out of Food in the Last Year: Not on file  Transportation Needs:   . Lack of Transportation (Medical): Not on file  . Lack of Transportation (Non-Medical): Not on file  Physical Activity:   . Days of Exercise per Week: Not on file  . Minutes of Exercise per Session: Not on file  Stress:   . Feeling of Stress : Not on file  Social Connections:   . Frequency of Communication with Friends and Family: Not on file   . Frequency of Social Gatherings with Friends and Family: Not on file  . Attends Religious Services: Not on file  . Active Member of Clubs or Organizations: Not on file  . Attends Archivist Meetings: Not on file  . Marital Status: Not on file  Intimate Partner Violence:   . Fear of Current or Ex-Partner: Not on file  . Emotionally Abused: Not on file  . Physically Abused: Not on file  . Sexually Abused: Not on file    Current Outpatient Medications on File Prior to Visit  Medication Sig Dispense Refill  . aspirin 325 MG tablet Take 1 tablet (325 mg total) by mouth every morning. 30 tablet 0  . DILT-XR 180 MG 24 hr capsule Take 1 capsule by mouth once daily 90 capsule 0  . docusate sodium (COLACE) 100 MG capsule Take 100 mg by mouth daily as needed for mild constipation or moderate constipation.     Marland Kitchen epoetin alfa (EPOGEN) 3000 UNIT/ML injection Inject 3,000 Units into the vein every 14 (fourteen) days.     Marland Kitchen ezetimibe (ZETIA) 10 MG tablet Take 1 tablet (10 mg total) by mouth daily. 90 tablet 3  . hydrALAZINE (APRESOLINE) 25 MG tablet TAKE 1 TABLET BY MOUTH TWICE DAILY AT 9 AM AND 9  PM 180 tablet 0  . HYDROcodone-acetaminophen (NORCO/VICODIN) 5-325 MG tablet One tablet every six hours for pain.  Limit 7 days. 28 tablet 0  . metolazone (ZAROXOLYN) 5 MG tablet Take 1 tablet by mouth once daily 90 tablet 0  . metoprolol tartrate (LOPRESSOR) 100 MG tablet Take 1 tablet by mouth twice daily 180 tablet 0  . metoprolol tartrate (LOPRESSOR) 50 MG tablet Take 1 tablet (50 mg total) by mouth 2 (two) times daily. (Patient taking differently: Take 25 mg by mouth 2 (two) times daily. ) 30 tablet 1  . midodrine (PROAMATINE) 5 MG tablet Take 5 mg by mouth 3 (three) times daily.    . potassium chloride (KLOR-CON) 10 MEQ tablet TAKE 3 TABLETS BY MOUTH THREE TIMES DAILY (Patient taking differently: 3 (three) times daily. 4 Tablets Three times Daily) 270 tablet 1  . pravastatin (PRAVACHOL) 20 MG  tablet TAKE 1 TABLET BY MOUTH ONCE DAILY BEFORE BREAKFAST 90 tablet 1  . torsemide (DEMADEX) 20 MG tablet Take 2 tablets (40 mg total) by mouth 2 (two) times daily. 56 tablet 0   No current facility-administered medications on file prior to visit.    Allergies  Allergen Reactions  . Benazepril Swelling  . Metronidazole Hives  . Mobic [Meloxicam] Hives  . Penicillins Hives and Swelling  . Sulfonamide  Derivatives Hives    Family History  Problem Relation Age of Onset  . Hypertension Brother   . Gout Brother   . Prostate cancer Brother   . Hypertension Brother   . Prostate cancer Brother   . Gout Brother   . Hypertension Sister   . Gout Sister   . Leukemia Sister 74  . Pancreatic cancer Sister 29  . Gout Sister   . Prostate cancer Brother   . Diabetes Neg Hx     BP 140/62   Pulse 62   Ht 5\' 5"  (1.651 m)   Wt 180 lb (81.6 kg)   SpO2 97%   BMI 29.95 kg/m    Review of Systems She denies hypoglycemia    Objective:   Physical Exam VITAL SIGNS:  See vs page GENERAL: no distress Pulses: dorsalis pedis intact bilat.   MSK: no deformity of the feet CV: 2+ bilat leg edema.   Skin:  no ulcer on the feet.  normal color and temp on the feet. Neuro: sensation is intact to touch on the feet, but decreased from normal.    A1c=6.8%    Assessment & Plan:  Insulin-requiring type 2 DM, with stage 4 CRI: well-controlled Abnormality of glucose-hemoglobin interaction, prob due to CRI.  This A1c underestimates avg glucose.    Patient Instructions  check your blood sugar twice a day.  vary the time of day when you check, between before the 3 meals, and at bedtime.  also check if you have symptoms of your blood sugar being too high or too low.  please keep a record of the readings and bring it to your next appointment here.  You can write it on any piece of paper.  please call us sooner if your blood sugar goes below 70, or if you have a lot of readings over 200.   Please continue  the same insulin.   On this type of insulin schedule, you should eat meals on a regular schedule.  If a meal is missed or significantly delayed, your blood sugar could go low.   Please come back for a follow-up appointment in 3 months.

## 2019-12-21 NOTE — Patient Instructions (Addendum)
check your blood sugar twice a day.  vary the time of day when you check, between before the 3 meals, and at bedtime.  also check if you have symptoms of your blood sugar being too high or too low.  please keep a record of the readings and bring it to your next appointment here.  You can write it on any piece of paper.  please call us sooner if your blood sugar goes below 70, or if you have a lot of readings over 200.   Please continue the same insulin.   On this type of insulin schedule, you should eat meals on a regular schedule.  If a meal is missed or significantly delayed, your blood sugar could go low.   Please come back for a follow-up appointment in 3 months.

## 2019-12-29 ENCOUNTER — Encounter (HOSPITAL_COMMUNITY): Payer: Self-pay

## 2019-12-29 ENCOUNTER — Encounter (HOSPITAL_COMMUNITY)
Admission: RE | Admit: 2019-12-29 | Discharge: 2019-12-29 | Disposition: A | Discharge status: Home / Self Care | Payer: PPO | Source: Ambulatory Visit | Attending: Nephrology | Admitting: Nephrology

## 2019-12-29 ENCOUNTER — Other Ambulatory Visit: Payer: Self-pay

## 2019-12-29 DIAGNOSIS — N184 Chronic kidney disease, stage 4 (severe): Secondary | ICD-10-CM | POA: Diagnosis not present

## 2019-12-29 LAB — POTASSIUM: Potassium: 3.2 mmol/L — ABNORMAL LOW (ref 3.5–5.1)

## 2019-12-29 LAB — POCT HEMOGLOBIN-HEMACUE: Hemoglobin: 12.3 g/dL (ref 12.0–15.0)

## 2019-12-29 MED ORDER — EPOETIN ALFA 3000 UNIT/ML IJ SOLN: 3000.0000 [IU] | Freq: Once | INTRAMUSCULAR | Status: DC

## 2019-12-30 ENCOUNTER — Other Ambulatory Visit: Payer: Self-pay | Admitting: Family Medicine

## 2019-12-31 ENCOUNTER — Encounter: Payer: Self-pay | Admitting: Orthopaedic Surgery

## 2019-12-31 ENCOUNTER — Ambulatory Visit: Payer: PPO | Admitting: Orthopaedic Surgery

## 2019-12-31 ENCOUNTER — Other Ambulatory Visit: Payer: Self-pay

## 2019-12-31 VITALS — BP 163/74 | HR 61 | Ht 65.0 in | Wt 180.0 lb

## 2019-12-31 DIAGNOSIS — G8929 Other chronic pain: Secondary | ICD-10-CM | POA: Diagnosis not present

## 2019-12-31 DIAGNOSIS — R2689 Other abnormalities of gait and mobility: Secondary | ICD-10-CM

## 2019-12-31 DIAGNOSIS — M5442 Lumbago with sciatica, left side: Secondary | ICD-10-CM | POA: Diagnosis not present

## 2019-12-31 MED ORDER — HYDROCODONE-ACETAMINOPHEN 5-325 MG PO TABS
ORAL_TABLET | ORAL | 0 refills | Status: DC
Start: 2019-12-31 — End: 2020-02-04

## 2019-12-31 NOTE — Progress Notes (Signed)
Patient Krista Strickland, female DOB:[birth year], 69 y.o. UYQ:[id number]  Chief Complaint  Patient presents with   Back Pain    about the same     HPI  Krista Strickland is a 69 y.o. female who has lower back pain, gout and peripheral neuropathy.  She has restarted Neurontin.  She has pain at night.  She will be seeing Dr. Moshe Cipro for this.  Her lower back is hurting more.  She walks bend forward and has balance issues.  I will begin PT for her.  Her husband is present.   Body mass index is 29.95 kg/m.  ROS  Review of Systems  Constitutional: Positive for activity change.  Musculoskeletal: Positive for arthralgias, back pain, gait problem and joint swelling.  Neurological: Tremors:  .wkl.  All other systems reviewed and are negative.   All other systems reviewed and are negative.  The following is a summary of the past history medically, past history surgically, known current medicines, social history and family history.  This information is gathered electronically by the computer from prior information and documentation.  I review this each visit and have found including this information at this point in the chart is beneficial and informative.    Past Medical History:  Diagnosis Date   Anemia    Arthritis    CKD (chronic kidney disease) stage 3, GFR 30-59 ml/min (HCC)    Diabetes mellitus, type 2 ([place])    Essential hypertension    GERD (gastroesophageal reflux disease)    Gout    History of MRSA infection 03/2009   History of stroke 2013   HOCM (hypertrophic obstructive cardiomyopathy) ([place])    Hypercalcemia 2017   Managed by nephrology   Hyperlipidemia    Obesity    Psoriasis    Uterine cancer ([place])    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    Past Surgical History:  Procedure Laterality Date   ABDOMINAL HYSTERECTOMY     APPENDECTOMY  2012   COLONOSCOPY WITH PROPOFOL N/A 08/24/2016   Procedure: COLONOSCOPY WITH PROPOFOL;  Surgeon: Rogene Houston, MD;  [facility];  Service: Endoscopy;  Laterality: N/A;  10:30   Excison of right breast cyst     LAPAROSCOPIC SALPINGO OOPHERECTOMY Right 04/22/2012   Procedure: LAPAROSCOPIC SALPINGO OOPHORECTOMY;  Surgeon: Jonnie Kind, MD;  Location: [facility];  Service: Gynecology;  Laterality: Right;  end 11:17   MASS EXCISION N/A 04/22/2012   Procedure: EXCISION SKIN TAGS NECK AND HEAD;  Surgeon: Jonnie Kind, MD;  Location: [facility];  Service: Gynecology;  Laterality: N/A;  start 11:19   PARTIAL HYSTERECTOMY     POLYPECTOMY  08/24/2016   Procedure: POLYPECTOMY;  Surgeon: Rogene Houston, MD;  [facility];  Service: Endoscopy;;  colon   Right neck biopsy      Family History  Problem Relation Age of Onset   Hypertension Brother    Gout Brother    Prostate cancer Brother    Hypertension Brother    Prostate cancer Brother    Gout Brother    Hypertension Sister    Gout Sister    Leukemia Sister 7   Pancreatic cancer Sister 50   Gout Sister    Prostate cancer Brother    Diabetes Neg Hx     Social History Social History   Tobacco Use   Smoking status: Former Smoker    Packs/day: 0.25    Years: 12.00    Pack years: 3.00  Quit date: 07/16/1978    Years since quitting: 41.4   Smokeless tobacco: Never Used  Vaping Use   Vaping Use: Never used  Substance Use Topics   Alcohol use: No   Drug use: No    Allergies  Allergen Reactions   Benazepril Swelling   Metronidazole Hives   Mobic [Meloxicam] Hives   Penicillins Hives and Swelling   Sulfonamide Derivatives Hives    Current Outpatient Medications  Medication Sig Dispense Refill   aspirin 325 MG tablet Take 1 tablet (325 mg total) by mouth every morning. 30 tablet 0   DILT-XR 180 MG 24 hr capsule Take 1 capsule by mouth once daily 90 capsule 0   docusate sodium (COLACE) 100 MG capsule Take 100 mg by mouth daily as needed for mild constipation or moderate  constipation.      epoetin alfa (EPOGEN) 3000 UNIT/ML injection Inject 3,000 Units into the vein every 14 (fourteen) days.      ezetimibe (ZETIA) 10 MG tablet Take 1 tablet (10 mg total) by mouth daily. 90 tablet 3   hydrALAZINE (APRESOLINE) 25 MG tablet TAKE 1 TABLET BY MOUTH TWICE DAILY AT 9 AM AND 9  PM 180 tablet 0   HYDROcodone-acetaminophen (NORCO/VICODIN) 5-325 MG tablet One tablet every six hours for pain.  Limit 7 days. 28 tablet 0   insulin NPH-regular Human (NOVOLIN 70/30 RELION) (70-30) 100 UNIT/ML injection Inject 90 Units into the skin daily with breakfast. 90 units in the morning 90 mL 3   metolazone (ZAROXOLYN) 5 MG tablet Take 1 tablet by mouth once daily 90 tablet 0   metoprolol tartrate (LOPRESSOR) 100 MG tablet Take 1 tablet by mouth twice daily 180 tablet 0   metoprolol tartrate (LOPRESSOR) 50 MG tablet Take 1 tablet (50 mg total) by mouth 2 (two) times daily. (Patient taking differently: Take 25 mg by mouth 2 (two) times daily. ) 30 tablet 1   midodrine (PROAMATINE) 5 MG tablet Take 5 mg by mouth 3 (three) times daily.     potassium chloride (KLOR-CON) 10 MEQ tablet TAKE 3 TABLETS BY MOUTH THREE TIMES DAILY (Patient taking differently: 3 (three) times daily. 4 Tablets Three times Daily) 270 tablet 1   pravastatin (PRAVACHOL) 20 MG tablet TAKE 1 TABLET BY MOUTH ONCE DAILY BEFORE BREAKFAST 90 tablet 1   torsemide (DEMADEX) 20 MG tablet Take 2 tablets (40 mg total) by mouth 2 (two) times daily. 56 tablet 0   No current facility-administered medications for this visit.     Physical Exam  Blood pressure (!) 163/74, pulse 61, height 5\' 5"  (1.651 m), weight 180 lb (81.6 kg).  Constitutional: overall normal hygiene, normal nutrition, well developed, normal grooming, normal body habitus. Assistive device:none  Musculoskeletal: gait and station Limp none, muscle tone and strength are normal, no tremors or atrophy is present.  .  Neurological: coordination overall  normal.  Deep tendon reflex/nerve stretch intact.  Sensation normal.  Cranial nerves II-XII intact.   Skin:   Normal overall no scars, lesions, ulcers or rashes. No psoriasis.  Psychiatric: Alert and oriented x 3.  Recent memory intact, remote memory unclear.  Normal mood and affect. Well groomed.  Good eye contact.  Cardiovascular: overall no swelling, no varicosities, no edema bilaterally, normal temperatures of the legs and arms, no clubbing, cyanosis and good capillary refill.  Lymphatic: palpation is normal.  Spine/Pelvis examination:  Inspection:  Overall, sacoiliac joint benign and hips nontender; without crepitus or defects.   Thoracic spine inspection: Alignment  normal without kyphosis present   Lumbar spine inspection:  Alignment  with normal lumbar lordosis, without scoliosis apparent.   Thoracic spine palpation:  without tenderness of spinal processes   Lumbar spine palpation: without tenderness of lumbar area; without tightness of lumbar muscles    Range of Motion:   Lumbar flexion, forward flexion is normal without pain or tenderness    Lumbar extension is full without pain or tenderness   Left lateral bend is normal without pain or tenderness   Right lateral bend is normal without pain or tenderness   Straight leg raising is normal  Strength & tone: normal   Stability overall normal stability  All other systems reviewed and are negative   The patient has been educated about the nature of the problem(s) and counseled on treatment options.  The patient appeared to understand what I have discussed and is in agreement with it.  Encounter Diagnoses  Name Primary?   Chronic left-sided low back pain with left-sided sciatica Yes   Balance problem     PLAN Call if any problems.  Precautions discussed.  Continue current medications.   Return to clinic 1 month   I have reviewed the [place] web site prior to prescribing  narcotic medicine for this patient.   Electronically Signed Sanjuana Kava, MD 12/2/20219:57 AM

## 2019-12-31 NOTE — Patient Instructions (Signed)
[facility] is [phone].

## 2020-01-06 DIAGNOSIS — E876 Hypokalemia: Secondary | ICD-10-CM | POA: Diagnosis not present

## 2020-01-07 ENCOUNTER — Other Ambulatory Visit: Payer: Self-pay | Admitting: Endocrinology

## 2020-01-11 DIAGNOSIS — M79674 Pain in right toe(s): Secondary | ICD-10-CM | POA: Diagnosis not present

## 2020-01-11 DIAGNOSIS — B351 Tinea unguium: Secondary | ICD-10-CM | POA: Diagnosis not present

## 2020-01-11 DIAGNOSIS — E1151 Type 2 diabetes mellitus with diabetic peripheral angiopathy without gangrene: Secondary | ICD-10-CM | POA: Diagnosis not present

## 2020-01-11 DIAGNOSIS — M79675 Pain in left toe(s): Secondary | ICD-10-CM | POA: Diagnosis not present

## 2020-01-12 ENCOUNTER — Encounter (HOSPITAL_COMMUNITY)
Admission: RE | Admit: 2020-01-12 | Discharge: 2020-01-12 | Disposition: A | Discharge status: Home / Self Care | Payer: PPO | Source: Ambulatory Visit | Attending: Nephrology | Admitting: Nephrology

## 2020-01-12 ENCOUNTER — Other Ambulatory Visit: Payer: Self-pay

## 2020-01-12 ENCOUNTER — Encounter (HOSPITAL_COMMUNITY): Payer: Self-pay

## 2020-01-12 DIAGNOSIS — D631 Anemia in chronic kidney disease: Secondary | ICD-10-CM | POA: Diagnosis not present

## 2020-01-12 DIAGNOSIS — N184 Chronic kidney disease, stage 4 (severe): Secondary | ICD-10-CM | POA: Diagnosis not present

## 2020-01-12 LAB — POCT HEMOGLOBIN-HEMACUE: Hemoglobin: 12.2 g/dL (ref 12.0–15.0)

## 2020-01-12 MED ORDER — EPOETIN ALFA 3000 UNIT/ML IJ SOLN: 3000.0000 [IU] | Freq: Once | INTRAMUSCULAR | Status: DC

## 2020-01-13 ENCOUNTER — Ambulatory Visit (HOSPITAL_COMMUNITY): Payer: PPO | Attending: Orthopaedic Surgery | Admitting: Physical Therapy

## 2020-01-13 ENCOUNTER — Encounter (HOSPITAL_COMMUNITY): Payer: Self-pay | Admitting: Physical Therapy

## 2020-01-13 DIAGNOSIS — G8929 Other chronic pain: Secondary | ICD-10-CM | POA: Insufficient documentation

## 2020-01-13 DIAGNOSIS — M545 Low back pain, unspecified: Secondary | ICD-10-CM | POA: Insufficient documentation

## 2020-01-13 DIAGNOSIS — R2689 Other abnormalities of gait and mobility: Secondary | ICD-10-CM | POA: Diagnosis not present

## 2020-01-13 DIAGNOSIS — R29898 Other symptoms and signs involving the musculoskeletal system: Secondary | ICD-10-CM

## 2020-01-13 DIAGNOSIS — M6281 Muscle weakness (generalized): Secondary | ICD-10-CM | POA: Diagnosis not present

## 2020-01-13 NOTE — Therapy (Signed)
[place], [place], [address] Phone: [phone]   Fax:  [phone]  Physical Therapy Evaluation  Patient Details  Name: Krista Strickland MRN: [id number] Date of Birth: [birth year] Referring Provider (PT): Sanjuana Kava MD   Encounter Date: 01/13/2020   PT End of Session - 01/13/20 1437    Visit Number 1    Number of Visits 8    Date for PT Re-Evaluation 02/10/20    Authorization Type [employer] (no vl, no auth)    PT Start Time 1403    PT Stop Time 1433    PT Time Calculation (min) 30 min    Activity Tolerance Patient tolerated treatment well;Patient limited by fatigue;Patient limited by pain    Behavior During Therapy [facility] for tasks assessed/performed           Past Medical History:  Diagnosis Date  . Anemia   . Arthritis   . CKD (chronic kidney disease) stage 3, GFR 30-59 ml/min (HCC)   . Diabetes mellitus, type 2 ([place])   . Essential hypertension   . GERD (gastroesophageal reflux disease)   . Gout   . History of MRSA infection 03/2009  . History of stroke 2013  . HOCM (hypertrophic obstructive cardiomyopathy) ([place])   . Hypercalcemia 2017   Managed by nephrology  . Hyperlipidemia   . Obesity   . Psoriasis   . Uterine cancer ([place])   . 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 ABDOMINAL HYSTERECTOMY    . APPENDECTOMY  2012  . COLONOSCOPY WITH PROPOFOL N/A 08/24/2016   Procedure: COLONOSCOPY WITH PROPOFOL;  Surgeon: Rogene Houston, MD;  [facility];  Service: Endoscopy;  Laterality: N/A;  10:30  . Excison of right breast cyst    . LAPAROSCOPIC SALPINGO OOPHERECTOMY Right 04/22/2012   Procedure: LAPAROSCOPIC SALPINGO OOPHORECTOMY;  Surgeon: Jonnie Kind, MD;  Location: [facility] ORS;  Service: Gynecology;  Laterality: Right;  end 11:17  . MASS EXCISION N/A 04/22/2012   Procedure: EXCISION SKIN TAGS NECK AND HEAD;  Surgeon: Jonnie Kind, MD;  Location: [facility] ORS;  Service: Gynecology;  Laterality: N/A;  start 11:19  . PARTIAL HYSTERECTOMY    . POLYPECTOMY  08/24/2016   Procedure: POLYPECTOMY;  Surgeon: Rogene Houston, MD;  Location[facility];  Service: Endoscopy;;  colon  . Right neck biopsy      There were no vitals filed for this visit.    Subjective Assessment - 01/13/20 1405    Subjective Patient is a 69 y.o. female who presents to physical therapy with c/o chronic LBP. Patient states she has a back problem. They wanted to do an operation in her back but she states she can't have one right now. She walks hunched over because it hurts to hold herself up. Her back hurts with sitting, standing, and walking. Pain is decreased with taking pain medicine. Patient states her main goal is to help straighten up her back.    Limitations Sitting;Standing;Walking;House hold activities    Patient Stated Goals straighten up her back    Currently in Pain? Yes    Pain Score 8     Pain Location Back    Pain Orientation Lower    Pain Descriptors / Indicators Cramping    Pain Type Chronic pain    Pain Onset More than a month ago    Pain Frequency Constant  [facility] PT Assessment - 01/13/20 0001      Assessment   Medical Diagnosis Chronic LBP    Referring Provider (PT) Sanjuana Kava MD    Onset Date/Surgical Date 01/10/16    Next MD Visit Jan. 6, 2022    Prior Therapy Yes for back      Precautions   Precautions None      Restrictions   Weight Bearing Restrictions No      Balance Screen   Has the patient fallen in the past 6 months Yes    How many times? 2    Has the patient had a decrease in activity level because of a fear of falling?  No    Is the patient reluctant to leave their home because of a fear of falling?  No      Prior Function   Level of Independence Independent      Cognition   Overall Cognitive Status Within Functional Limits for tasks assessed      Observation/Other  Assessments   Observations Ambulates with trunk flexed, unsteady, without AD    Focus on Therapeutic Outcomes (FOTO)  complete next session      Sensation   Light Touch Appears Intact      ROM / Strength   AROM / PROM / Strength AROM;Strength      AROM   Overall AROM Comments worst pain with extension    AROM Assessment Site Lumbar    Lumbar Flexion 0% limited    Lumbar Extension 75% limited    Lumbar - Right Side Bend 50% limited favors flexion    Lumbar - Left Side Bend 50% limited favors flexion    Lumbar - Right Rotation 0% limited    Lumbar - Left Rotation 0% limited      Strength   Strength Assessment Site Hip;Knee;Ankle    Right/Left Hip Right;Left    Right Hip Flexion 3+/5    Left Hip Flexion 4/5    Right/Left Knee Right;Left    Right Knee Flexion 4+/5    Right Knee Extension 3+/5   limited by knee pain   Left Knee Flexion 4+/5    Left Knee Extension 3+/5   limited by knee pain   Right/Left Ankle Right;Left    Right Ankle Dorsiflexion 5/5    Left Ankle Dorsiflexion 5/5      Transfers   Five time sit to stand comments  33.75 seconds    Comments use of hands on knees, pushing off chair, requiring multiple attempts sometimes to come to standing, dynamic knee valgus bilateral, impaired eccentric control, relies on momentum      Ambulation/Gait   Ambulation/Gait Yes    Ambulation/Gait Assistance 4: Min guard    Ambulation Distance (Feet) 100 Feet    Assistive device None    Gait Pattern Trunk flexed;Wide base of support    Ambulation Surface Level;Indoor    Gait Comments unsteady cadence with trunk flexed without AD                      Objective measurements completed on examination: See above findings.               PT Education - 01/13/20 1405    Education Details Patient educated on exam findings, POC, scope of PT    Person(s) Educated Patient    Methods Explanation;Demonstration    Comprehension Verbalized understanding;Returned  demonstration            PT  Short Term Goals - 01/13/20 1441      PT SHORT TERM GOAL #1   Title Patient will be independent with HEP in order to improve functional outcomes.    Time 2    Period Weeks    Status New    Target Date 01/27/20      PT SHORT TERM GOAL #2   Title Patient will report at least 25% improvement in symptoms for improved quality of life.    Time 2    Period Weeks    Status New    Target Date 01/27/20             PT Long Term Goals - 01/13/20 1442      PT LONG TERM GOAL #1   Title Patient will report at least 75% improvement in symptoms for improved quality of life.    Time 4    Period Weeks    Status New    Target Date 02/10/20      PT LONG TERM GOAL #2   Title Patient will be able to complete 5x STS in under 11.4 seconds in order to reduce the risk of falls.    Time 4    Period Weeks    Status New    Target Date 02/10/20      PT LONG TERM GOAL #3   Title Patient will report that her pain has not exceeded a 3/10 over the course of a 1 week period indicating improved tolerance to daily activities.     Time 4    Period Weeks    Status New    Target Date 02/10/20                  Plan - 01/13/20 1437    Clinical Impression Statement Patient is a 69 y.o. female who presents to physical therapy with c/o chronic LBP. She presents with pain limited deficits in lumbar spine and LE strength, ROM, endurance, postural impairments, spinal mobility and functional mobility with ADL. She is having to modify and restrict ADL as indicated by subjective information and objective measures which is affecting overall participation. Patient will benefit from skilled physical therapy in order to improve function and reduce impairment.    Personal Factors and Comorbidities Age;Fitness;Behavior Pattern;Past/Current Experience;Comorbidity 3+;Time since onset of injury/illness/exacerbation;Education    Comorbidities Chronic LBP, HTN, DM, HLD     Examination-Activity Limitations Locomotion Level;Transfers;Bend;Sit;Squat;Stairs;Stand;Lift;Carry    Examination-Participation Restrictions Church;Meal Prep;Cleaning;Community Activity;Volunteer;Shop;Laundry;Yard Work    Merchant [employer] officer Stable/Uncomplicated    Designer, jewellery Low    Rehab Potential Fair    PT Frequency 2x / week    PT Duration 4 weeks    PT Treatment/Interventions ADLs/Self Care Home Management;Aquatic Therapy;Cryotherapy;Electrical Stimulation;Iontophoresis 4mg /ml Dexamethasone;Moist Heat;DME Instruction;Traction;Gait training;Stair training;Functional mobility training;Therapeutic activities;Therapeutic exercise;Balance training;Neuromuscular re-education;Patient/family education;Manual techniques;Joint Manipulations;Spinal Manipulations;Dry needling;Energy conservation;Manual lymph drainage;Passive range of motion    PT Next Visit Plan begin core, hip, and quad strengthening and progress as tolerated, complete FOTO    PT Home Exercise Plan initiate next session    Consulted and Agree with Plan of Care Patient           Patient will benefit from skilled therapeutic intervention in order to improve the following deficits and impairments:  Abnormal gait,Decreased range of motion,Decreased endurance,Decreased activity tolerance,Pain,Impaired flexibility,Improper body mechanics,Postural dysfunction,Decreased strength,Decreased mobility  Visit Diagnosis: Chronic bilateral low back pain, unspecified whether sciatica present  Other symptoms and signs involving the musculoskeletal system  Other abnormalities of gait and  mobility  Muscle weakness (generalized)     Problem List Patient Active Problem List   Diagnosis Date Noted  . Sleep-disordered breathing 09/02/2019  . Type 2 diabetes mellitus with diabetic chronic kidney disease ([place]) 08/17/2019  . Symptomatic anemia 07/22/2019  . Diastolic dysfunction 93/81/0175  . Anemia in chronic  kidney disease 12/18/2018  . Hypercalcemia 12/18/2018  . Proteinuria 12/18/2018  . Secondary hyperparathyroidism ([place]) 12/18/2018  . Vitamin D deficiency 12/18/2018  . CKD (chronic kidney disease) stage 4, GFR 15-29 ml/min (HCC) 12/18/2018  . Hypertensive retinopathy of both eyes 11/14/2018  . Nuclear sclerotic cataract of both eyes 11/14/2018  . Iritis of left eye 11/14/2018  . Diabetic neuropathy ([place]) 11/09/2015  . Leg edema 11/30/2014  . Multinodular goiter (nontoxic) 09/20/2013  . Hypertriglyceridemia 08/05/2013  . Back pain 04/16/2013  . CVA (cerebral vascular accident) ([place]) 07/23/2011  . SLEEP APNEA 09/06/2009  . Hyperuricemia 11/04/2008  . Hyperlipidemia LDL goal <100 07/02/2007  . Obesity (BMI 30.0-34.9) 07/02/2007  . Essential hypertension 07/02/2007    2:44 PM, 01/13/20 Mearl Latin PT, DPT Physical Therapist at [place], [place], [address] Phone: [phone]   Fax:  [phone]  Name: RODOLFO NOTARO MRN: [id number] Date of Birth: 06-05-1950

## 2020-01-20 ENCOUNTER — Other Ambulatory Visit: Payer: Self-pay

## 2020-01-20 MED ORDER — METOLAZONE 5 MG PO TABS: 5.0000 mg | ORAL_TABLET | Freq: Every day | ORAL | 0 refills | Status: DC

## 2020-01-27 ENCOUNTER — Encounter (HOSPITAL_COMMUNITY)
Admission: RE | Admit: 2020-01-27 | Discharge: 2020-01-27 | Disposition: A | Discharge status: Home / Self Care | Payer: PPO | Source: Ambulatory Visit | Attending: Nephrology | Admitting: Nephrology

## 2020-01-27 ENCOUNTER — Other Ambulatory Visit: Payer: Self-pay

## 2020-01-27 ENCOUNTER — Encounter (HOSPITAL_COMMUNITY): Payer: Self-pay

## 2020-01-27 DIAGNOSIS — N184 Chronic kidney disease, stage 4 (severe): Secondary | ICD-10-CM | POA: Diagnosis not present

## 2020-01-27 LAB — POCT HEMOGLOBIN-HEMACUE: Hemoglobin: 12.2 g/dL (ref 12.0–15.0)

## 2020-01-27 MED ORDER — EPOETIN ALFA 3000 UNIT/ML IJ SOLN: 3000.0000 [IU] | Freq: Once | INTRAMUSCULAR | Status: DC

## 2020-01-28 ENCOUNTER — Ambulatory Visit: Payer: PPO | Admitting: Orthopaedic Surgery

## 2020-02-02 ENCOUNTER — Ambulatory Visit (HOSPITAL_COMMUNITY): Payer: PPO | Admitting: Physical Therapy

## 2020-02-03 DIAGNOSIS — H1820 Unspecified corneal edema: Secondary | ICD-10-CM | POA: Diagnosis not present

## 2020-02-03 DIAGNOSIS — H20022 Recurrent acute iridocyclitis, left eye: Secondary | ICD-10-CM | POA: Diagnosis not present

## 2020-02-03 DIAGNOSIS — H35033 Hypertensive retinopathy, bilateral: Secondary | ICD-10-CM | POA: Diagnosis not present

## 2020-02-03 DIAGNOSIS — E119 Type 2 diabetes mellitus without complications: Secondary | ICD-10-CM | POA: Diagnosis not present

## 2020-02-04 ENCOUNTER — Ambulatory Visit: Payer: PPO | Admitting: Orthopaedic Surgery

## 2020-02-04 ENCOUNTER — Other Ambulatory Visit: Payer: Self-pay

## 2020-02-04 ENCOUNTER — Ambulatory Visit (HOSPITAL_COMMUNITY): Payer: PPO | Admitting: Physical Therapy

## 2020-02-04 ENCOUNTER — Encounter: Payer: Self-pay | Admitting: Orthopaedic Surgery

## 2020-02-04 VITALS — BP 170/65 | HR 71 | Ht 65.0 in | Wt 188.0 lb

## 2020-02-04 DIAGNOSIS — E0849 Diabetes mellitus due to underlying condition with other diabetic neurological complication: Secondary | ICD-10-CM | POA: Diagnosis not present

## 2020-02-04 DIAGNOSIS — G8929 Other chronic pain: Secondary | ICD-10-CM | POA: Diagnosis not present

## 2020-02-04 DIAGNOSIS — M5442 Lumbago with sciatica, left side: Secondary | ICD-10-CM

## 2020-02-04 DIAGNOSIS — R2689 Other abnormalities of gait and mobility: Secondary | ICD-10-CM | POA: Diagnosis not present

## 2020-02-04 MED ORDER — HYDROCODONE-ACETAMINOPHEN 5-325 MG PO TABS: ORAL_TABLET | ORAL | 0 refills | Status: DC

## 2020-02-04 NOTE — Progress Notes (Signed)
Patient [name], female DOB:08/10/1950, 70 y.o. EXB:[id number]  Chief Complaint  Patient presents with  . Back Pain    F/u back pain, left leg post thigh cramps when walks    HPI  Krista Strickland is a 70 y.o. female who has peripheral neuropathy and balance issues and lower back pain.  She went to PT only one time and then canceled secondary to it bothering her "nerves".  She walks bend forward.  I have shown her exercise of standing against door and standing straight.  She needs to do this three times a day.  She has been on Neurontin but her foot doctor has stopped Rxing it.  I will have her call Dr. Moshe Cipro about seeing neurologist.  She is to have eye surgery on the left eye soon.  Her A1C is 6.7.  She has no new trauma.  She has persistent lower back pain. She has no trauma.   Body mass index is 31.28 kg/m.  ROS  Review of Systems  Constitutional: Positive for activity change.  Musculoskeletal: Positive for arthralgias, back pain, gait problem and joint swelling.  Neurological: Tremors:  .wkl.  All other systems reviewed and are negative.   All other systems reviewed and are negative.  The following is a summary of the past history medically, past history surgically, known current medicines, social history and family history.  This information is gathered electronically by the computer from prior information and documentation.  I review this each visit and have found including this information at this point in the chart is beneficial and informative.    Past Medical History:  Diagnosis Date  . Anemia   . Arthritis   . CKD (chronic kidney disease) stage 3, GFR 30-59 ml/min (HCC)   . Diabetes mellitus, type 2 ([place])   . Essential hypertension   . GERD (gastroesophageal reflux disease)   . Gout   . History of MRSA infection 03/2009  . History of stroke 2013  . HOCM (hypertrophic obstructive cardiomyopathy) ([place])   . Hypercalcemia 2017   Managed by  nephrology  . Hyperlipidemia   . Obesity   . Psoriasis   . Uterine cancer ([place])   . 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 ABDOMINAL HYSTERECTOMY    . APPENDECTOMY  2012  . COLONOSCOPY WITH PROPOFOL N/A 08/24/2016   Procedure: COLONOSCOPY WITH PROPOFOL;  Surgeon: Rogene Houston, MD;  [facility];  Service: Endoscopy;  Laterality: N/A;  10:30  . Excison of right breast cyst    . LAPAROSCOPIC SALPINGO OOPHERECTOMY Right 04/22/2012   Procedure: LAPAROSCOPIC SALPINGO OOPHORECTOMY;  Surgeon: Jonnie Kind, MD;  Location: [facility] ORS;  Service: Gynecology;  Laterality: Right;  end 11:17  . MASS EXCISION N/A 04/22/2012   Procedure: EXCISION SKIN TAGS NECK AND HEAD;  Surgeon: Jonnie Kind, MD;  Location: [facility];  Service: Gynecology;  Laterality: N/A;  start 11:19  . PARTIAL HYSTERECTOMY    . POLYPECTOMY  08/24/2016   Procedure: POLYPECTOMY;  Surgeon: Rogene Houston, MD;  [facility];  Service: Endoscopy;;  colon  . Right neck biopsy      Family History  Problem Relation Age of Onset  . Hypertension Brother   . Gout Brother   . Prostate cancer Brother   . Hypertension Brother   . Prostate cancer Brother   . Gout Brother   . Hypertension Sister   .  Gout Sister   . Leukemia Sister 78  . Pancreatic cancer Sister 2  . Gout Sister   . Prostate cancer Brother   . Diabetes Neg Hx     Social History Social History   Tobacco Use  . Smoking status: Former Smoker    Packs/day: 0.25    Years: 12.00    Pack years: 3.00    Quit date: 07/16/1978    Years since quitting: 41.5  . Smokeless tobacco: Never Used  Vaping Use  . Vaping Use: Never used  Substance Use Topics  . Alcohol use: No  . Drug use: No    Allergies  Allergen Reactions  . Benazepril Swelling  . Metronidazole Hives  . Mobic [Meloxicam] Hives  . Penicillins Hives and Swelling  . Sulfonamide Derivatives Hives    Current Outpatient  Medications  Medication Sig Dispense Refill  . aspirin 325 MG tablet Take 1 tablet (325 mg total) by mouth every morning. 30 tablet 0  . DILT-XR 180 MG 24 hr capsule Take 1 capsule by mouth once daily 90 capsule 0  . docusate sodium (COLACE) 100 MG capsule Take 100 mg by mouth daily as needed for mild constipation or moderate constipation.     Marland Kitchen epoetin alfa (EPOGEN) 3000 UNIT/ML injection Inject 3,000 Units into the vein every 14 (fourteen) days.     Marland Kitchen ezetimibe (ZETIA) 10 MG tablet Take 1 tablet (10 mg total) by mouth daily. 90 tablet 3  . hydrALAZINE (APRESOLINE) 25 MG tablet TAKE 1 TABLET BY MOUTH TWICE DAILY AT 9 AM AND 9  PM 180 tablet 0  . metolazone (ZAROXOLYN) 5 MG tablet Take 1 tablet (5 mg total) by mouth daily. 90 tablet 0  . metoprolol tartrate (LOPRESSOR) 100 MG tablet Take 1 tablet by mouth twice daily 180 tablet 0  . metoprolol tartrate (LOPRESSOR) 50 MG tablet Take 1 tablet (50 mg total) by mouth 2 (two) times daily. (Patient taking differently: Take 25 mg by mouth 2 (two) times daily.) 30 tablet 1  . midodrine (PROAMATINE) 5 MG tablet Take 5 mg by mouth 3 (three) times daily.    Marland Kitchen NOVOLIN 70/30 RELION (70-30) 100 UNIT/ML injection INJECT 190 UNITS SUBCUTANEOUSLY ONCE DAILY WITH BREAKFAST 70 mL 0  . potassium chloride (KLOR-CON) 10 MEQ tablet TAKE 3 TABLETS BY MOUTH THREE TIMES DAILY (Patient taking differently: 3 (three) times daily. 4 Tablets Three times Daily) 270 tablet 1  . pravastatin (PRAVACHOL) 20 MG tablet TAKE 1 TABLET BY MOUTH ONCE DAILY BEFORE BREAKFAST 90 tablet 1  . torsemide (DEMADEX) 20 MG tablet Take 2 tablets (40 mg total) by mouth 2 (two) times daily. 56 tablet 0  . HYDROcodone-acetaminophen (NORCO/VICODIN) 5-325 MG tablet One tablet every six hours for pain.  Limit 7 days. 28 tablet 0   No current facility-administered medications for this visit.     Physical Exam  Blood pressure (!) 170/65, pulse 71, height 5\' 5"  (1.651 m), weight 188 lb (85.3  kg).  Constitutional: overall normal hygiene, normal nutrition, well developed, normal grooming, normal body habitus. Assistive device:none  Musculoskeletal: gait and station Limp none, muscle tone and strength are normal, no tremors or atrophy is present.  .  Neurological: coordination overall normal.  Deep tendon reflex/nerve stretch intact.  Sensation normal.  Cranial nerves II-XII intact.   Skin:   Normal overall no scars, lesions, ulcers or rashes. No psoriasis.  Psychiatric: Alert and oriented x 3.  Recent memory intact, remote memory unclear.  Normal mood and  affect. Well groomed.  Good eye contact.  Cardiovascular: overall no swelling, no varicosities, no edema bilaterally, normal temperatures of the legs and arms, no clubbing, cyanosis and good capillary refill.  Lymphatic: palpation is normal.  Spine/Pelvis examination:  Inspection:  Overall, sacoiliac joint benign and hips nontender; without crepitus or defects.   Thoracic spine inspection: Alignment normal without kyphosis present   Lumbar spine inspection:  Alignment  with normal lumbar lordosis, without scoliosis apparent.   Thoracic spine palpation:  without tenderness of spinal processes   Lumbar spine palpation: without tenderness of lumbar area; without tightness of lumbar muscles    Range of Motion:   Lumbar flexion, forward flexion is normal without pain or tenderness    Lumbar extension is full without pain or tenderness   Left lateral bend is normal without pain or tenderness   Right lateral bend is normal without pain or tenderness   Straight leg raising is normal  Strength & tone: normal   Stability overall normal stability  All other systems reviewed and are negative   The patient has been educated about the nature of the problem(s) and counseled on treatment options.  The patient appeared to understand what I have discussed and is in agreement with it.  Encounter Diagnoses  Name Primary?  .  Chronic left-sided low back pain with left-sided sciatica Yes  . Balance problem   . 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  Precautions discussed.  Continue current medications.   Return to clinic 2 months   I have reviewed the [place] web site prior to prescribing narcotic medicine for this patient.   Electronically Signed Sanjuana Kava, MD 1/6/202210:08 AM

## 2020-02-05 DIAGNOSIS — H1820 Unspecified corneal edema: Secondary | ICD-10-CM | POA: Insufficient documentation

## 2020-02-09 ENCOUNTER — Ambulatory Visit (HOSPITAL_COMMUNITY): Payer: PPO

## 2020-02-10 ENCOUNTER — Other Ambulatory Visit: Payer: Self-pay | Admitting: Family Medicine

## 2020-02-11 ENCOUNTER — Encounter (HOSPITAL_COMMUNITY): Payer: PPO | Admitting: Physical Therapy

## 2020-02-11 ENCOUNTER — Other Ambulatory Visit: Payer: Self-pay | Admitting: Endocrinology

## 2020-02-11 ENCOUNTER — Encounter (HOSPITAL_COMMUNITY)
Admission: RE | Admit: 2020-02-11 | Discharge: 2020-02-11 | Disposition: A | Discharge status: Home / Self Care | Payer: HMO | Source: Ambulatory Visit | Attending: Nephrology | Admitting: Nephrology

## 2020-02-11 ENCOUNTER — Other Ambulatory Visit: Payer: Self-pay

## 2020-02-11 DIAGNOSIS — N184 Chronic kidney disease, stage 4 (severe): Secondary | ICD-10-CM | POA: Diagnosis not present

## 2020-02-11 DIAGNOSIS — D631 Anemia in chronic kidney disease: Secondary | ICD-10-CM | POA: Insufficient documentation

## 2020-02-11 LAB — POCT HEMOGLOBIN-HEMACUE: Hemoglobin: 11.8 g/dL — ABNORMAL LOW (ref 12.0–15.0)

## 2020-02-11 MED ORDER — EPOETIN ALFA 3000 UNIT/ML IJ SOLN: 3000.0000 [IU] | Freq: Once | INTRAMUSCULAR | Status: DC

## 2020-02-16 ENCOUNTER — Encounter (HOSPITAL_COMMUNITY): Payer: PPO | Admitting: Physical Therapy

## 2020-02-17 ENCOUNTER — Encounter: Payer: Self-pay | Admitting: Family Medicine

## 2020-02-17 ENCOUNTER — Other Ambulatory Visit: Payer: Self-pay

## 2020-02-17 ENCOUNTER — Ambulatory Visit (INDEPENDENT_AMBULATORY_CARE_PROVIDER_SITE_OTHER): Payer: HMO | Admitting: Family Medicine

## 2020-02-17 VITALS — BP 140/80 | Ht 63.0 in | Wt 180.0 lb

## 2020-02-17 DIAGNOSIS — Z0000 Encounter for general adult medical examination without abnormal findings: Secondary | ICD-10-CM

## 2020-02-17 NOTE — Patient Instructions (Addendum)
Krista Strickland , Thank you for taking time to come for your Medicare Wellness Visit. I appreciate your ongoing commitment to your health goals. Please review the following plan we discussed and let me know if I can assist you in the future.   Screening recommendations/referrals: Colonoscopy: Due 07/2016 Mammogram: Due 01/2020 Bone Density: Up to date  Recommended yearly ophthalmology/optometry visit for glaucoma screening and checkup Recommended yearly dental visit for hygiene and checkup  Vaccinations: Influenza vaccine: up to date Pneumococcal vaccine: up to date Tdap vaccine: up to date Shingles vaccine: declined  Advanced directives: none at this time  Conditions/risks identified: Falls, DM  Next appointment: 02/29/2020 @ 3pm    Preventive Care 81 Years and Older, Female Preventive care refers to lifestyle choices and visits with your health care provider that can promote health and wellness. What does preventive care include?  A yearly physical exam. This is also called an annual well check.  Dental exams once or twice a year.  Routine eye exams. Ask your health care provider how often you should have your eyes checked.  Personal lifestyle choices, including:  Daily care of your teeth and gums.  Regular physical activity.  Eating a healthy diet.  Avoiding tobacco and drug use.  Limiting alcohol use.  Practicing safe sex.  Taking low-dose aspirin every day.  Taking vitamin and mineral supplements as recommended by your health care provider. What happens during an annual well check? The services and screenings done by your health care provider during your annual well check will depend on your age, overall health, lifestyle risk factors, and family history of disease. Counseling  Your health care provider may ask you questions about your:  Alcohol use.  Tobacco use.  Drug use.  Emotional well-being.  Home and relationship well-being.  Sexual  activity.  Eating habits.  History of falls.  Memory and ability to understand (cognition).  Work and work Statistician.  Reproductive health. Screening  You may have the following tests or measurements:  Height, weight, and BMI.  Blood pressure.  Lipid and cholesterol levels. These may be checked every 5 years, or more frequently if you are over 52 years old.  Skin check.  Lung cancer screening. You may have this screening every year starting at age 69 if you have a 30-pack-year history of smoking and currently smoke or have quit within the past 15 years.  Fecal occult blood test (FOBT) of the stool. You may have this test every year starting at age 37.  Flexible sigmoidoscopy or colonoscopy. You may have a sigmoidoscopy every 5 years or a colonoscopy every 10 years starting at age 15.  Hepatitis C blood test.  Hepatitis B blood test.  Sexually transmitted disease (STD) testing.  Diabetes screening. This is done by checking your blood sugar (glucose) after you have not eaten for a while (fasting). You may have this done every 1-3 years.  Bone density scan. This is done to screen for osteoporosis. You may have this done starting at age 2.  Mammogram. This may be done every 1-2 years. Talk to your health care provider about how often you should have regular mammograms. Talk with your health care provider about your test results, treatment options, and if necessary, the need for more tests. Vaccines  Your health care provider may recommend certain vaccines, such as:  Influenza vaccine. This is recommended every year.  Tetanus, diphtheria, and acellular pertussis (Tdap, Td) vaccine. You may need a Td booster every 10 years.  Zoster vaccine. You may need this after age 64.  Pneumococcal 13-valent conjugate (PCV13) vaccine. One dose is recommended after age 59.  Pneumococcal polysaccharide (PPSV23) vaccine. One dose is recommended after age 55. Talk to your health care  provider about which screenings and vaccines you need and how often you need them. This information is not intended to replace advice given to you by your health care provider. Make sure you discuss any questions you have with your health care provider. Document Released: 02/11/2015 Document Revised: 10/05/2015 Document Reviewed: 11/16/2014 Elsevier Interactive Patient Education  2017 [place] Prevention in the Home Falls can cause injuries. They can happen to people of all ages. There are many things you can do to make your home safe and to help prevent falls. What can I do on the outside of my home?  Regularly fix the edges of walkways and driveways and fix any cracks.  Remove anything that might make you trip as you walk through a door, such as a raised step or threshold.  Trim any bushes or trees on the path to your home.  Use bright outdoor lighting.  Clear any walking paths of anything that might make someone trip, such as rocks or tools.  Regularly check to see if handrails are loose or broken. Make sure that both sides of any steps have handrails.  Any raised decks and porches should have guardrails on the edges.  Have any leaves, snow, or ice cleared regularly.  Use sand or salt on walking paths during winter.  Clean up any spills in your garage right away. This includes oil or grease spills. What can I do in the bathroom?  Use night lights.  Install grab bars by the toilet and in the tub and shower. Do not use towel bars as grab bars.  Use non-skid mats or decals in the tub or shower.  If you need to sit down in the shower, use a plastic, non-slip stool.  Keep the floor dry. Clean up any water that spills on the floor as soon as it happens.  Remove soap buildup in the tub or shower regularly.  Attach bath mats securely with double-sided non-slip rug tape.  Do not have throw rugs and other things on the floor that can make you trip. What can I do in  the bedroom?  Use night lights.  Make sure that you have a light by your bed that is easy to reach.  Do not use any sheets or blankets that are too big for your bed. They should not hang down onto the floor.  Have a firm chair that has side arms. You can use this for support while you get dressed.  Do not have throw rugs and other things on the floor that can make you trip. What can I do in the kitchen?  Clean up any spills right away.  Avoid walking on wet floors.  Keep items that you use a lot in easy-to-reach places.  If you need to reach something above you, use a strong step stool that has a grab bar.  Keep electrical cords out of the way.  Do not use floor polish or wax that makes floors slippery. If you must use wax, use non-skid floor wax.  Do not have throw rugs and other things on the floor that can make you trip. What can I do with my stairs?  Do not leave any items on the stairs.  Make sure that there are  handrails on both sides of the stairs and use them. Fix handrails that are broken or loose. Make sure that handrails are as long as the stairways.  Check any carpeting to make sure that it is firmly attached to the stairs. Fix any carpet that is loose or worn.  Avoid having throw rugs at the top or bottom of the stairs. If you do have throw rugs, attach them to the floor with carpet tape.  Make sure that you have a light switch at the top of the stairs and the bottom of the stairs. If you do not have them, ask someone to add them for you. What else can I do to help prevent falls?  Wear shoes that:  Do not have high heels.  Have rubber bottoms.  Are comfortable and fit you well.  Are closed at the toe. Do not wear sandals.  If you use a stepladder:  Make sure that it is fully opened. Do not climb a closed stepladder.  Make sure that both sides of the stepladder are locked into place.  Ask someone to hold it for you, if possible.  Clearly mark and  make sure that you can see:  Any grab bars or handrails.  First and last steps.  Where the edge of each step is.  Use tools that help you move around (mobility aids) if they are needed. These include:  Canes.  Walkers.  Scooters.  Crutches.  Turn on the lights when you go into a dark area. Replace any light bulbs as soon as they burn out.  Set up your furniture so you have a clear path. Avoid moving your furniture around.  If any of your floors are uneven, fix them.  If there are any pets around you, be aware of where they are.  Review your medicines with your doctor. Some medicines can make you feel dizzy. This can increase your chance of falling. Ask your doctor what other things that you can do to help prevent falls. This information is not intended to replace advice given to you by your health care provider. Make sure you discuss any questions you have with your health care provider. Document Released: 11/11/2008 Document Revised: 06/23/2015 Document Reviewed: 02/19/2014 Elsevier Interactive Patient Education  2017 [employer].

## 2020-02-17 NOTE — Progress Notes (Signed)
Subjective:   Krista Strickland is a 70 y.o. female who presents for an Initial Medicare Annual Wellness Visit.  Participants: Patient and Provider for Visit and Wrap up  Method of visit: Telephone  Location of Patient: Home Location of Provider: Office Consent was obtain for visit over the telephone. Services rendered by provider: Visit was performed via telephone    I verified that I am speaking with the correct person using two identifiers.   Review of Systems     Cardiac Risk Factors include: advanced age (>13men, >80 women);diabetes mellitus;dyslipidemia;hypertension;sedentary lifestyle;obesity (BMI >30kg/m2)     Objective:    Today's Vitals   02/17/20 1328 02/17/20 1329  BP: 140/80   Weight: 180 lb (81.6 kg)   Height: 5\' 3"  (1.6 m)   PainSc: 0-No pain 4    Body mass index is 31.89 kg/m.  Advanced Directives 02/17/2020 01/13/2020 09/23/2019 07/22/2019 09/03/2018 03/05/2018 06/06/2017  Does Patient Have a Medical Advance Directive? No No No No No No No  Would patient like information on creating a medical advance directive? No - Patient declined No - Patient declined - No - Patient declined No - Patient declined No - Patient declined No - Patient declined  Pre-existing out of facility DNR order (yellow form or pink MOST form) - - - - - - -    Current Medications (verified) Outpatient Encounter Medications as of 02/17/2020  Medication Sig  . aspirin 325 MG tablet Take 1 tablet (325 mg total) by mouth every morning.  Marland Kitchen DILT-XR 180 MG 24 hr capsule Take 1 capsule by mouth once daily  . docusate sodium (COLACE) 100 MG capsule Take 100 mg by mouth daily as needed for mild constipation or moderate constipation.   Marland Kitchen epoetin alfa (EPOGEN) 3000 UNIT/ML injection Inject 3,000 Units into the vein every 14 (fourteen) days.   Marland Kitchen ezetimibe (ZETIA) 10 MG tablet Take 1 tablet (10 mg total) by mouth daily.  . hydrALAZINE (APRESOLINE) 25 MG tablet TAKE 1 TABLET BY MOUTH TWICE DAILY AT  9   AM  AND  9  PM  . HYDROcodone-acetaminophen (NORCO/VICODIN) 5-325 MG tablet One tablet every six hours for pain.  Limit 7 days.  . metolazone (ZAROXOLYN) 5 MG tablet Take 1 tablet by mouth once daily  . metoprolol tartrate (LOPRESSOR) 100 MG tablet Take 1 tablet by mouth twice daily  . metoprolol tartrate (LOPRESSOR) 50 MG tablet Take 1 tablet (50 mg total) by mouth 2 (two) times daily. (Patient taking differently: Take 25 mg by mouth 2 (two) times daily.)  . midodrine (PROAMATINE) 5 MG tablet Take 5 mg by mouth 3 (three) times daily.  Marland Kitchen NOVOLIN 70/30 RELION (70-30) 100 UNIT/ML injection INJECT 190 UNITS SUBCUTANEOUSLY ONCE DAILY WITH BREAKFAST  . potassium chloride (KLOR-CON) 10 MEQ tablet TAKE 3 TABLETS BY MOUTH THREE TIMES DAILY (Patient taking differently: 3 (three) times daily. 4 Tablets Three times Daily)  . pravastatin (PRAVACHOL) 20 MG tablet TAKE 1 TABLET BY MOUTH ONCE DAILY BEFORE BREAKFAST  . torsemide (DEMADEX) 20 MG tablet Take 2 tablets (40 mg total) by mouth 2 (two) times daily.   No facility-administered encounter medications on file as of 02/17/2020.    Allergies (verified) Benazepril, Metronidazole, Mobic [meloxicam], Penicillins, and Sulfonamide derivatives   History: Past Medical History:  Diagnosis Date  . Anemia   . Arthritis   . CKD (chronic kidney disease) stage 3, GFR 30-59 ml/min (HCC)   . Diabetes mellitus, type 2 ([place])   . Essential  hypertension   . GERD (gastroesophageal reflux disease)   . Gout   . History of MRSA infection 03/2009  . History of stroke 2013  . HOCM (hypertrophic obstructive cardiomyopathy) ([place])   . Hypercalcemia 2017   Managed by nephrology  . Hyperlipidemia   . Obesity   . Psoriasis   . Uterine cancer ([place])   . 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   Past Surgical History:  Procedure Laterality Date  . ABDOMINAL HYSTERECTOMY    . APPENDECTOMY  2012  . COLONOSCOPY WITH PROPOFOL N/A 08/24/2016   Procedure: COLONOSCOPY WITH PROPOFOL;   Surgeon: Rogene Houston, MD;  Location: [facility];  Service: Endoscopy;  Laterality: N/A;  10:30  . Excison of right breast cyst    . LAPAROSCOPIC SALPINGO OOPHERECTOMY Right 04/22/2012   Procedure: LAPAROSCOPIC SALPINGO OOPHORECTOMY;  Surgeon: Jonnie Kind, MD;  Location: [facility];  Service: Gynecology;  Laterality: Right;  end 11:17  . MASS EXCISION N/A 04/22/2012   Procedure: EXCISION SKIN TAGS NECK AND HEAD;  Surgeon: Jonnie Kind, MD;  Location: [facility];  Service: Gynecology;  Laterality: N/A;  start 11:19  . PARTIAL HYSTERECTOMY    . POLYPECTOMY  08/24/2016   Procedure: POLYPECTOMY;  Surgeon: Rogene Houston, MD;  Location: [facility];  Service: Endoscopy;;  colon  . Right neck biopsy     Family History  Problem Relation Age of Onset  . Hypertension Brother   . Gout Brother   . Prostate cancer Brother   . Hypertension Brother   . Prostate cancer Brother   . Gout Brother   . Hypertension Sister   . Gout Sister   . Leukemia Sister 75  . Pancreatic cancer Sister 22  . Gout Sister   . Prostate cancer Brother   . Diabetes Neg Hx    Social History   Socioeconomic History  . Marital status: Married    Spouse name: Not on file  . Number of children: 1  . Years of education: Not on file  . Highest education level: 11th grade  Occupational History  . Occupation: disabled     Fish farm manager: UNEMPLOYED  Tobacco Use  . Smoking status: Former Smoker    Packs/day: 0.25    Years: 12.00    Pack years: 3.00    Quit date: 07/16/1978    Years since quitting: 41.6  . Smokeless tobacco: Never Used  Vaping Use  . Vaping Use: Never used  Substance and Sexual Activity  . Alcohol use: No  . Drug use: No  . Sexual activity: Yes    Birth control/protection: Surgical  Other Topics Concern  . Not on file  Social History Narrative  . Not on file   Social Determinants of Health   Financial Resource Strain: Low Risk   . Difficulty of Paying Living Expenses: Not hard at all   Food Insecurity: No Food Insecurity  . Worried About Charity fundraiser in the Last Year: Never true  . Ran Out of Food in the Last Year: Never true  Transportation Needs: No Transportation Needs  . Lack of Transportation (Medical): No  . Lack of Transportation (Non-Medical): No  Physical Activity: Inactive  . Days of Exercise per Week: 0 days  . Minutes of Exercise per Session: 0 min  Stress: No Stress Concern Present  . Feeling of Stress : Only a little  Social Connections: Socially Isolated  . Frequency of Communication with Friends and Family: Never  . Frequency  of Social Gatherings with Friends and Family: Never  . Attends Religious Services: Never  . Active Member of Clubs or Organizations: No  . Attends Archivist Meetings: Never  . Marital Status: Married    Tobacco Counseling Counseling given: Not Answered   Clinical Intake:  Pre-visit preparation completed: No  Pain : 0-10 Pain Score: 4  Pain Type: Chronic pain     Nutritional Status: BMI > 30  Obese Nutritional Risks: None Diabetes: Yes CBG done?: Yes CBG resulted in Enter/ Edit results?: Yes (149) Did pt. bring in CBG monitor from home?: No  How often do you need to have someone help you when you read instructions, pamphlets, or other written materials from your doctor or pharmacy?: 1 - Never What is the last grade level you completed in school?: 11  Diabetic? yes  Interpreter Needed?: No      Activities of Daily Living In your present state of health, do you have any difficulty performing the following activities: 02/17/2020 07/22/2019  Hearing? N N  Vision? N N  Difficulty concentrating or making decisions? N N  Walking or climbing stairs? Y N  Dressing or bathing? N N  Doing errands, shopping? N N  Some recent data might be hidden    Patient Care Team: Fayrene Helper, MD as PCP - General Domenic Polite Aloha Gell, MD as PCP - Cardiology (Cardiology) Phillips Odor, MD as  Attending Physician (Neurology) Fran Lowes, MD (Inactive) as Attending Physician (Nephrology) Renato Shin, MD as Consulting Physician (Endocrinology) Luvenia Starch Simeon Craft, PA-C as Physician Assistant (Neurosurgery)  Indicate any recent Medical Services you may have received from other than [facility] providers in the past year (date may be approximate).     Assessment:   This is a routine wellness examination for Fr[place]on.  Hearing/Vision screen No exam data present  Dietary issues and exercise activities discussed: Current Exercise Habits: The patient does not participate in regular exercise at present, Exercise limited by: orthopedic condition(s)  Goals    . Have 3 meals a day     Recommend eating 3 balanced meals a day.      Depression Screen PHQ 2/9 Scores 02/17/2020 02/17/2020 11/12/2019 09/02/2019 10/10/2018 09/10/2018 09/03/2018  PHQ - 2 Score 0 0 0 0 0 0 0  PHQ- 9 Score - - - - - - -    Fall Risk Fall Risk  02/17/2020 11/12/2019 07/28/2019 04/16/2019 02/11/2019  Falls in the past year? 1 1 0 0 1  Number falls in past yr: 0 0 0 0 1  Injury with Fall? 0 1 0 0 0  Comment - - - - -  Risk for fall due to : Impaired balance/gait;Impaired mobility - - - -  Risk for fall due to: Comment - - - - -  Follow up Education provided Falls evaluation completed - - -    FALL RISK PREVENTION PERTAINING TO THE HOME:  Any stairs in or around the home? No  If so, are there any without handrails? No  Home free of loose throw rugs in walkways, pet beds, electrical cords, etc? Yes  Adequate lighting in your home to reduce risk of falls? Yes   ASSISTIVE DEVICES UTILIZED TO PREVENT FALLS:  Life alert? No  Use of a cane, walker or w/c? Yes  Grab bars in the bathroom? Yes  Shower chair or bench in shower? No  Elevated toilet seat or a handicapped toilet? No    Cognitive Function: MMSE - Mini Mental  State Exam 04/08/2014  Orientation to time 5  Orientation to Place 5  Registration  3  Attention/ Calculation 5  Recall 2  Language- name 2 objects 2  Language- repeat 1  Language- follow 3 step command 3  Language- read & follow direction 1  Write a sentence 1  Copy design 1  Total score 29     6CIT Screen 06/03/2018 05/29/2017 03/13/2016  What Year? 0 points 0 points 0 points  What month? 0 points 0 points 0 points  What time? 0 points 0 points 0 points  Count back from 20 0 points 0 points 0 points  Months in reverse 0 points 0 points 0 points  Repeat phrase 0 points 0 points 0 points  Total Score 0 0 0    Immunizations Immunization History  Administered Date(s) Administered  . Fluad Quad(high Dose 65+) 10/13/2018  . H1N1 01/01/2008  . Influenza Split 10/24/2011  . Influenza Whole 11/04/2006, 10/28/2007, 11/04/2008, 11/07/2009, 10/11/2010  . Influenza, High Dose Seasonal PF 11/18/2017  . Influenza,inj,Quad PF,6+ Mos 10/28/2012, 12/16/2013, 11/30/2014, 11/09/2015, 11/05/2016  . Moderna Sars-Covid-2 Vaccination 03/12/2019, 04/10/2019  . Pneumococcal Conjugate-13 08/05/2013  . Pneumococcal Polysaccharide-23 06/07/2004, 11/07/2009, 07/20/2015  . Td 09/21/2002  . Tdap 12/16/2013    TDAP status: Up to date  Flu Vaccine status: Up to date  Pneumococcal vaccine status: Up to date  Covid-19 vaccine status: Completed vaccines  Qualifies for Shingles Vaccine? No   Zostavax completed No   Shingrix Completed?: No.    Education has been provided regarding the importance of this vaccine. Patient has been advised to call insurance company to determine out of pocket expense if they have not yet received this vaccine. Advised may also receive vaccine at local pharmacy or Health Dept. Verbalized acceptance and understanding.  Screening Tests Health Maintenance  Topic Date Due  . INFLUENZA VACCINE  08/30/2019  . COVID-19 Vaccine (3 - Booster for Moderna series) 10/11/2019  . HEMOGLOBIN A1C  06/19/2020  . OPHTHALMOLOGY EXAM  10/29/2020  . FOOT EXAM  12/20/2020  .  MAMMOGRAM  02/22/2021  . TETANUS/TDAP  12/17/2023  . COLONOSCOPY (Pts 45-54yrs Insurance coverage will need to be confirmed)  08/25/2026  . DEXA SCAN  Completed  . Hepatitis C Screening  Completed  . PNA vac Low Risk Adult  Completed    Health Maintenance  Health Maintenance Due  Topic Date Due  . INFLUENZA VACCINE  08/30/2019  . COVID-19 Vaccine (3 - Booster for Moderna series) 10/11/2019    Colorectal cancer screening: Type of screening: Colonoscopy. Completed 07/2016. Repeat every 10 years  Mammogram status: Completed 01/2019 . Repeat every year  Bone Density status: Completed 01/2016. Results reflect: Bone density results: NORMAL. Repeat every 5 years.  Lung Cancer Screening: (Low Dose CT Chest recommended if Age 78-80 years, 30 pack-year currently smoking OR have quit w/in 15years.) does not qualify.   Lung Cancer Screening Referral:   Additional Screening:  Hepatitis C Screening: does qualify  Vision Screening: Recommended annual ophthalmology exams for early detection of glaucoma and other disorders of the eye. Is the patient up to date with their annual eye exam?  Yes  Who is the provider or what is the name of the office in which the patient attends annual eye exams?  If pt is not established with a provider, would they like to be referred to a provider to establish care? No .   Dental Screening: Recommended annual dental exams for proper oral hygiene  [employer]  Referral / Chronic Care Management: CRR required this visit?  No   CCM required this visit?  No      Plan:     1. Encounter for Medicare annual wellness exam   I have personally reviewed and noted the following in the patient's chart:   . Medical and social history . Use of alcohol, tobacco or illicit drugs  . Current medications and supplements . Functional ability and status . Nutritional status . Physical activity . Advanced directives . List of other physicians . Hospitalizations,  surgeries, and ER visits in previous 12 months . Vitals . Screenings to include cognitive, depression, and falls . Referrals and appointments  In addition, I have reviewed and discussed with patient certain preventive protocols, quality metrics, and best practice recommendations. A written personalized care plan for preventive services as well as general preventive health recommendations were provided to patient.    Today's visit was performed over the phone. Pt was at home, and consent was given. Provider was at home office. The visit took 30 mins to complete.    Perlie Mayo, NP   02/17/2020

## 2020-02-18 ENCOUNTER — Encounter (HOSPITAL_COMMUNITY): Payer: PPO | Admitting: Physical Therapy

## 2020-02-19 ENCOUNTER — Other Ambulatory Visit: Payer: Self-pay

## 2020-02-19 ENCOUNTER — Other Ambulatory Visit (HOSPITAL_COMMUNITY)
Admission: RE | Admit: 2020-02-19 | Discharge: 2020-02-19 | Disposition: A | Discharge status: Home / Self Care | Payer: HMO | Source: Ambulatory Visit | Attending: Nephrology | Admitting: Nephrology

## 2020-02-19 DIAGNOSIS — I129 Hypertensive chronic kidney disease with stage 1 through stage 4 chronic kidney disease, or unspecified chronic kidney disease: Secondary | ICD-10-CM | POA: Diagnosis not present

## 2020-02-19 DIAGNOSIS — E876 Hypokalemia: Secondary | ICD-10-CM | POA: Diagnosis not present

## 2020-02-19 DIAGNOSIS — N189 Chronic kidney disease, unspecified: Secondary | ICD-10-CM | POA: Diagnosis not present

## 2020-02-19 DIAGNOSIS — E1122 Type 2 diabetes mellitus with diabetic chronic kidney disease: Secondary | ICD-10-CM | POA: Diagnosis not present

## 2020-02-19 DIAGNOSIS — R809 Proteinuria, unspecified: Secondary | ICD-10-CM | POA: Diagnosis not present

## 2020-02-19 DIAGNOSIS — E1129 Type 2 diabetes mellitus with other diabetic kidney complication: Secondary | ICD-10-CM | POA: Diagnosis not present

## 2020-02-19 LAB — POTASSIUM: Potassium: 3.7 mmol/L (ref 3.5–5.1)

## 2020-02-24 ENCOUNTER — Other Ambulatory Visit: Payer: Self-pay | Admitting: Endocrinology

## 2020-02-24 ENCOUNTER — Other Ambulatory Visit: Payer: Self-pay | Admitting: Family Medicine

## 2020-02-24 NOTE — Telephone Encounter (Signed)
Please advise Last OV 11/21

## 2020-02-25 ENCOUNTER — Encounter (HOSPITAL_COMMUNITY)
Admission: RE | Admit: 2020-02-25 | Discharge: 2020-02-25 | Disposition: A | Discharge status: Home / Self Care | Payer: HMO | Source: Ambulatory Visit | Attending: Nephrology | Admitting: Nephrology

## 2020-02-25 ENCOUNTER — Encounter (HOSPITAL_COMMUNITY): Payer: Self-pay

## 2020-02-25 ENCOUNTER — Other Ambulatory Visit: Payer: Self-pay

## 2020-02-25 ENCOUNTER — Encounter (HOSPITAL_COMMUNITY): Payer: HMO

## 2020-02-25 DIAGNOSIS — N184 Chronic kidney disease, stage 4 (severe): Secondary | ICD-10-CM | POA: Diagnosis not present

## 2020-02-25 LAB — RENAL FUNCTION PANEL
Albumin: 3.9 g/dL (ref 3.5–5.0)
Anion gap: 13 (ref 5–15)
BUN: 88 mg/dL — ABNORMAL HIGH (ref 8–23)
CO2: 28 mmol/L (ref 22–32)
Calcium: 10 mg/dL (ref 8.9–10.3)
Chloride: 93 mmol/L — ABNORMAL LOW (ref 98–111)
Creatinine, Ser: 2.29 mg/dL — ABNORMAL HIGH (ref 0.44–1.00)
GFR, Estimated: 23 mL/min — ABNORMAL LOW (ref >60)
Glucose, Bld: 137 mg/dL — ABNORMAL HIGH (ref 70–99)
Phosphorus: 3.8 mg/dL (ref 2.5–4.6)
Potassium: 3 mmol/L — ABNORMAL LOW (ref 3.5–5.1)
Sodium: 134 mmol/L — ABNORMAL LOW (ref 135–145)

## 2020-02-25 LAB — POCT HEMOGLOBIN-HEMACUE: Hemoglobin: 12.6 g/dL (ref 12.0–15.0)

## 2020-02-25 LAB — CBC
HCT: 40.7 % (ref 36.0–46.0)
Hemoglobin: 12.9 g/dL (ref 12.0–15.0)
MCH: 27.6 pg (ref 26.0–34.0)
MCHC: 31.7 g/dL (ref 30.0–36.0)
MCV: 87 fL (ref 80.0–100.0)
Platelets: 322 10*3/uL (ref 150–400)
RBC: 4.68 MIL/uL (ref 3.87–5.11)
RDW: 15.4 % (ref 11.5–15.5)
WBC: 9.7 10*3/uL (ref 4.0–10.5)
nRBC: 0 % (ref 0.0–0.2)

## 2020-02-25 MED ORDER — EPOETIN ALFA 3000 UNIT/ML IJ SOLN: 3000.0000 [IU] | Freq: Once | INTRAMUSCULAR | Status: DC

## 2020-02-29 ENCOUNTER — Encounter: Payer: PPO | Admitting: Family Medicine

## 2020-03-03 ENCOUNTER — Ambulatory Visit: Payer: PPO | Admitting: Student

## 2020-03-04 ENCOUNTER — Ambulatory Visit (INDEPENDENT_AMBULATORY_CARE_PROVIDER_SITE_OTHER): Payer: HMO | Admitting: Student

## 2020-03-04 ENCOUNTER — Encounter: Payer: Self-pay | Admitting: Student

## 2020-03-04 ENCOUNTER — Other Ambulatory Visit: Payer: Self-pay

## 2020-03-04 VITALS — BP 136/58 | HR 60 | Ht 63.0 in | Wt 186.0 lb

## 2020-03-04 DIAGNOSIS — E876 Hypokalemia: Secondary | ICD-10-CM

## 2020-03-04 DIAGNOSIS — I421 Obstructive hypertrophic cardiomyopathy: Secondary | ICD-10-CM

## 2020-03-04 DIAGNOSIS — I10 Essential (primary) hypertension: Secondary | ICD-10-CM | POA: Diagnosis not present

## 2020-03-04 DIAGNOSIS — R600 Localized edema: Secondary | ICD-10-CM

## 2020-03-04 DIAGNOSIS — Z79899 Other long term (current) drug therapy: Secondary | ICD-10-CM | POA: Diagnosis not present

## 2020-03-04 DIAGNOSIS — N1832 Chronic kidney disease, stage 3b: Secondary | ICD-10-CM

## 2020-03-04 MED ORDER — POTASSIUM CHLORIDE ER 10 MEQ PO TBCR: 30.0000 meq | EXTENDED_RELEASE_TABLET | Freq: Three times a day (TID) | ORAL | 3 refills | Status: DC

## 2020-03-04 NOTE — Progress Notes (Signed)
Cardiology Office Note    Date:  03/04/2020   ID:  Krista Strickland, Krista Strickland [birth year], MRN [id number]  PCP:  Fayrene Helper, MD  Cardiologist: Rozann Lesches, MD    Chief Complaint  Patient presents with  . Follow-up    4 month visit    History of Present Illness:    Krista Strickland is a 70 y.o. female with past medical history of hypertrophic obstructive cardiomyopathy, HTN, HLD, Type 2 DM and Stage 3-4 CKD who presents to the office today for 47-month follow-up.  She was last examined by myself in 10/2019 and reported still having lower extremity edema but was typically not taking her evening diuretic dose due to frequent urination. Breathing has been stable and she denies any chest pain or palpitations.  She was continued on Cardizem CD 180 mg daily and Lopressor 50 mg twice daily for HOCM along with Torsemide 40mg  BID but dosing of her diuretic was deferred to Nephrology in the setting of her CKD.    In talking with the patient and her husband today, she reports doing well since her last visit. Breathing has been stable and she denies any orthopnea, PND or dyspnea on exertion. She does have chronic lower extremity edema which has been stable. Reports good compliance with her diuretic and was recently evaluated by Nephrology last month.  She does have cramps in her legs at night and has only been taking K-dur 30mg  BID instead of her prescribed dosing of TID. She denies any recent exertional chest pain or palpitations.   Past Medical History:  Diagnosis Date  . Anemia   . Arthritis   . CKD (chronic kidney disease) stage 3, GFR 30-59 ml/min (HCC)   . Diabetes mellitus, type 2 ([place])   . Essential hypertension   . GERD (gastroesophageal reflux disease)   . Gout   . History of MRSA infection 03/2009  . History of stroke 2013  . HOCM (hypertrophic obstructive cardiomyopathy) ([place])   . Hypercalcemia 2017   Managed by nephrology  . Hyperlipidemia   . Obesity   . Psoriasis   .  Uterine cancer ([place])   . 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 ABDOMINAL HYSTERECTOMY    . APPENDECTOMY  2012  . COLONOSCOPY WITH PROPOFOL N/A 08/24/2016   Procedure: COLONOSCOPY WITH PROPOFOL;  Surgeon: Rogene Houston, MD;  Location: [facility];  Service: Endoscopy;  Laterality: N/A;  10:30  . Excison of right breast cyst    . LAPAROSCOPIC SALPINGO OOPHERECTOMY Right 04/22/2012   Procedure: LAPAROSCOPIC SALPINGO OOPHORECTOMY;  Surgeon: Jonnie Kind, MD;  Location: [facility] OR[facility];  Service: Gynecology;  Laterality: Right;  end 11:17  . MASS EXCISION N/A 04/22/2012   Procedure: EXCISION SKIN TAGS NECK AND HEAD;  Surgeon: Jonnie Kind, MD;  Location: [facility];  Service: Gynecology;  Laterality: N/A;  start 11:19  . PARTIAL HYSTERECTOMY    . POLYPECTOMY  08/24/2016   Procedure: POLYPECTOMY;  Surgeon: Rogene Houston, MD;  Location: [facility];  Service: Endoscopy;;  colon  . Right neck biopsy      Current Medications: Outpatient Medications Prior to Visit  Medication Sig Dispense Refill  . aspirin 325 MG tablet Take 1 tablet (325 mg total) by mouth every morning. 30 tablet 0  . DILT-XR 180 MG 24 hr capsule Take 1 capsule by mouth once daily 90 capsule 0  . docusate sodium (COLACE)  100 MG capsule Take 100 mg by mouth daily as needed for mild constipation or moderate constipation.     Marland Kitchen epoetin alfa (EPOGEN) 3000 UNIT/ML injection Inject 3,000 Units into the vein every 14 (fourteen) days.     Marland Kitchen ezetimibe (ZETIA) 10 MG tablet Take 1 tablet (10 mg total) by mouth daily. 90 tablet 3  . hydrALAZINE (APRESOLINE) 25 MG tablet TAKE 1 TABLET BY MOUTH TWICE DAILY AT  9  AM  AND  9  PM 180 tablet 0  . HYDROcodone-acetaminophen (NORCO/VICODIN) 5-325 MG tablet One tablet every six hours for pain.  Limit 7 days. 28 tablet 0  . metolazone (ZAROXOLYN) 5 MG tablet Take 1 tablet by mouth once daily 90 tablet 0  . metoprolol tartrate (LOPRESSOR) 100 MG  tablet Take 1 tablet by mouth twice daily (Patient taking differently: 50 mg 2 (two) times daily.) 180 tablet 0  . NOVOLIN 70/30 RELION (70-30) 100 UNIT/ML injection INJECT 190 UNITS SUBCUTANEOUSLY ONCE DAILY WITH BREAKFAST 70 mL 0  . ONETOUCH ULTRA test strip USE 1 STRIP TO CHECK GLUCOSE TWICE DAILY 200 each 0  . pravastatin (PRAVACHOL) 20 MG tablet TAKE 1 TABLET BY MOUTH ONCE DAILY BEFORE BREAKFAST 90 tablet 1  . torsemide (DEMADEX) 20 MG tablet Take 2 tablets (40 mg total) by mouth 2 (two) times daily. 56 tablet 0  . potassium chloride (KLOR-CON) 10 MEQ tablet TAKE 3 TABLETS BY MOUTH THREE TIMES DAILY (Patient taking differently: 3 (three) times daily. 4 Tablets Three times Daily) 270 tablet 1  . midodrine (PROAMATINE) 5 MG tablet Take 5 mg by mouth 3 (three) times daily. (Patient not taking: Reported on 03/04/2020)    . metoprolol tartrate (LOPRESSOR) 50 MG tablet Take 1 tablet (50 mg total) by mouth 2 (two) times daily. (Patient taking differently: Take 25 mg by mouth 2 (two) times daily.) 30 tablet 1   No facility-administered medications prior to visit.     Allergies:   Benazepril, Metronidazole, Mobic [meloxicam], Penicillins, and Sulfonamide derivatives   Social History   Socioeconomic History  . Marital status: Married    Spouse name: Not on file  . Number of children: 1  . Years of education: Not on file  . Highest education level: 11th grade  Occupational History  . Occupation: disabled     Fish farm manager: UNEMPLOYED  Tobacco Use  . Smoking status: Former Smoker    Packs/day: 0.25    Years: 12.00    Pack years: 3.00    Quit date: 07/16/1978    Years since quitting: 41.6  . Smokeless tobacco: Never Used  Vaping Use  . Vaping Use: Never used  Substance and Sexual Activity  . Alcohol use: No  . Drug use: No  . Sexual activity: Yes    Birth control/protection: Surgical  Other Topics Concern  . Not on file  Social History Narrative  . Not on file   Social Determinants of  Health   Financial Resource Strain: Low Risk   . Difficulty of Paying Living Expenses: Not hard at all  Food Insecurity: No Food Insecurity  . Worried About Charity fundraiser in the Last Year: Never true  . Ran Out of Food in the Last Year: Never true  Transportation Needs: No Transportation Needs  . Lack of Transportation (Medical): No  . Lack of Transportation (Non-Medical): No  Physical Activity: Inactive  . Days of Exercise per Week: 0 days  . Minutes of Exercise per Session: 0 min  Stress: No  Stress Concern Present  . Feeling of Stress : Only a little  Social Connections: Socially Isolated  . Frequency of Communication with Friends and Family: Never  . Frequency of Social Gatherings with Friends and Family: Never  . Attends Religious Services: Never  . Active Member of Clubs or Organizations: No  . Attends Archivist Meetings: Never  . Marital Status: Married     Family History:  The patient's family history includes Gout in her brother, brother, sister, and sister; Hypertension in her brother, brother, and sister; Leukemia (age of onset: 84) in her sister; Pancreatic cancer (age of onset: 75) in her sister; Prostate cancer in her brother, brother, and brother.   Review of Systems:   Please see the history of present illness.     General:  No chills, fever, night sweats or weight changes.  Cardiovascular:  No chest pain, dyspnea on exertion, orthopnea, palpitations, paroxysmal nocturnal dyspnea. Positive for edema.  Dermatological: No rash, lesions/masses Respiratory: No cough, dyspnea Urologic: No hematuria, dysuria Abdominal:   No nausea, vomiting, diarrhea, bright red blood per rectum, melena, or hematemesis Neurologic:  No visual changes, wkns, changes in mental status. All other systems reviewed and are otherwise negative except as noted above.   Physical Exam:    VS:  BP (!) 136/58   Pulse 60   Ht 5\' 3"  (1.6 m)   Wt 186 lb (84.4 kg)   SpO2 98%    BMI 32.95 kg/m    General: Well developed, well nourished,female appearing in no acute distress. Head: Normocephalic, atraumatic. Neck: No carotid bruits. JVD not elevated.  Lungs: Respirations regular and unlabored, without wheezes or rales.  Heart: Regular rate and rhythm. No S3 or S4.  2/6 systolic murmur along A[place].  Abdomen: Appears non-distended. No obvious abdominal masses. Msk:  Strength and tone appear normal for age. No obvious joint deformities or effusions. Extremities: No clubbing or cyanosis. Chronic appearing edema.  Distal pedal pulses are 2+ bilaterally. Neuro: Alert and oriented X 3. Moves all extremities spontaneously. No focal deficits noted. Psych:  Responds to questions appropriately with a normal affect. Skin: No rashes or lesions noted  Wt Readings from Last 3 Encounters:  03/04/20 186 lb (84.4 kg)  02/17/20 180 lb (81.6 kg)  02/04/20 188 lb (85.3 kg)     Studies/Labs Reviewed:   EKG:  EKG is not ordered today.    Recent Labs: 07/22/2019: ALT 20; B Natriuretic Peptide 423.0; TSH 1.411 09/23/2019: Magnesium 1.7 02/25/2020: BUN 88; Creatinine, Ser 2.29; Hemoglobin 12.9; Platelets 322; Potassium 3.0; Sodium 134   Lipid Panel    Component Value Date/Time   CHOL 155 02/11/2019 1026   CHOL 117 09/07/2017 0930   TRIG 150 (H) 02/11/2019 1026   HDL 32 (L) 02/11/2019 1026   HDL 24 (L) 09/07/2017 0930   CHOLHDL 4.8 02/11/2019 1026   VLDL 55 (H) 06/01/2016 1048   LDLCALC 98 02/11/2019 1026    Additional studies/ records that were reviewed today include:   Echocardiogram: 08/2019 IMPRESSIONS    1. There is mild SAM of the anterior mitral valve leaflet in the setting  of severe symmertic LVH and hyperdynamic LV function. Dynamic LVOT  gradient of 38 mmHg. Marland Kitchen Left ventricular ejection fraction, by estimation,  is 70 to 75%. The left ventricle has  hyperdynamic function. The left ventricle has no regional wall motion  abnormalities. There is severe left  ventricular hypertrophy. Left  ventricular diastolic parameters are consistent with Grade I diastolic  dysfunction (impaired relaxation). Elevated  left atrial pressure.  2. Right ventricular systolic function is normal. The right ventricular  size is normal.  3. Left atrial size was severely dilated.  4. The mitral valve is normal in structure. Trivial mitral valve  regurgitation. Mild mitral stenosis.  5. The aortic valve is tricuspid. Aortic valve regurgitation is not  visualized. No aortic stenosis is present.  6. The inferior vena cava is normal in size with greater than 50%  respiratory variability, suggesting right atrial pressure of 3 mmHg.   Assessment:    1. HOCM (hypertrophic obstructive cardiomyopathy) ([place])   2. Essential hypertension   3. Medication management   4. Bilateral leg edema   5. Stage 3b chronic kidney disease ([place])   6. Hypokalemia      Plan:   In order of problems listed above:  1. HOCM - Her breathing has overall been stable and she denies any recent chest pain or palpitations. Will continue current medical therapy with Lopressor 100 mg twice daily and Cardizem CD 180 mg daily. She does remain on ASA 325mg  daily per Neurology and this could be reduced to 81mg  daily from a cardiac perspective.   2. HTN - BP is well controlled at 136/58 during today's visit. Continue current medication regimen with Cardizem CD 180 mg daily, Hydralazine 25 mg twice daily, Lopressor 100 mg twice daily and Torsemide 40 mg twice daily.   3. Chronic Lower Extremity Edema - Symptoms have overall been stable and she denies any respiratory issues. Will defer continuation of diuretic therapy to Nephrology as she is on Torsemide 40mg  BID and Metolazone 5mg  daily but also takes Midodrine 5mg  TID for orthostasis.   4.  Stage 3-4 CKD/Hypokalemia - She is followed by Dr. Theador Hawthorne and creatinine was stable at 2.29 by recent labs. K+ was low at 3.0 and she does report cramping  along her lower extremities. She is currently taking K-dur 30mg  BID instead of her prescribed dosing of TID. I encouraged her to resume TID dosing. Will recheck a BMET in 2 weeks.     Medication Adjustments/Labs and Tests Ordered: Current medicines are reviewed at length with the patient today.  Concerns regarding medicines are outlined above.  Medication changes, Labs and Tests ordered today are listed in the Patient Instructions below. Patient Instructions  Medication Instructions:  Your physician recommends that you continue on your current medications as directed. Please refer to the Current Medication list given to you today.  Take Potassium 10 meq 3 Tablets 3 Times Daily   *If you need a refill on your cardiac medications before your next appointment, please call your pharmacy*   Lab Work: Your physician recommends that you return for lab work in: 2 Weeks   If you have labs (blood work) drawn today and your tests are completely normal, you will receive your results only by: Marland Kitchen MyChart Message (if you have MyChart) OR . A paper copy in the mail If you have any lab test that is abnormal or we need to change your treatment, we will call you to review the results.   Testing/Procedures: NONE    Follow-Up: At [facility], you and your health needs are our priority.  As part of our continuing mission to provide you with exceptional heart care, we have created designated Provider Care Teams.  These Care Teams include your primary Cardiologist (physician) and Advanced Practice Providers (APPs -  Physician Assistants and Nurse Practitioners) who all work together to provide you  with the care you need, when you need it.  We recommend signing up for the patient portal called "MyChart".  Sign up information is provided on this After Visit Summary.  MyChart is used to connect with patients for Virtual Visits (Telemedicine).  Patients are able to view lab/test results, encounter notes,  upcoming appointments, etc.  Non-urgent messages can be sent to your provider as well.   To learn more about what you can do with MyChart, go to [email or web address].    Your next appointment:   6 month(s)  The format for your next appointment:   In Person  Provider:   Rozann Lesches, MD   Other Instructions Thank you for choosing [facility]!    Potassium Content of Foods  Potassium is a mineral found in many foods and drinks. It affects how the heart works, and helps keep fluids and minerals balanced in the body. The amount of potassium you need each day depends on your age and any medical conditions you may have. Talk to your health care provider or dietitian about how much potassium you need. The following lists of foods provide the general serving size for foods and the approximate amount of potassium in each serving, listed in milligrams (mg). Actual values may vary depending on the product and how it is processed. High in potassium The following foods and beverages have 200 mg or more of potassium per serving:  Apricots (raw) - 2 have 200 mg of potassium.  Apricots (dry) - 5 have 200 mg of potassium.  Artichoke - 1 medium has 345 mg of potassium.  Avocado -  fruit has 245 mg of potassium.  Banana - 1 medium fruit has 425 mg of potassium.  [place] or baked beans (canned) -  cup has 280 mg of potassium.  White beans (canned) -  cup has 595 mg potassium.  Beef roast - 3 oz has 320 mg of potassium.  Ground beef - 3 oz has 270 mg of potassium.  Beets (raw or cooked) -  cup has 260 mg of potassium.  Bran muffin - 2 oz has 300 mg of potassium.  Broccoli (cooked) -  cup has 230 mg of potassium.  Brussels sprouts -  cup has 250 mg of potassium.  Cantaloupe -  cup has 215 mg of potassium.  Cereal, 100% bran -  cup has 200-400 mg of potassium.  Cheeseburger -1 single fast food burger has 225-400 mg of potassium.  Chicken - 3 oz has 220 mg of  potassium.  Clams (canned) - 3 oz has 535 mg of potassium.  Crab - 3 oz has 225 mg of potassium.  Dates - 5 have 270 mg of potassium.  Dried beans and peas -  cup has 300-475 mg of potassium.  Figs (dried) - 2 have 260 mg of potassium.  Fish (halibut, tuna, cod, snapper) - 3 oz has 480 mg of potassium.  Fish (salmon, haddock, swordfish, perch) - 3 oz has 300 mg of potassium.  Fish (tuna, canned) - 3 oz has 200 mg of potassium.  [place] fries (fast food) - 3 oz has 470 mg of potassium.  Granola with fruit and nuts -  cup has 200 mg of potassium.  Grapefruit juice -  cup has 200 mg of potassium.  Honeydew melon -  cup has 200 mg of potassium.  Kale (raw) - 1 cup has 300 mg of potassium.  Kiwi - 1 medium fruit has 240 mg of potassium.  Kohlrabi, rutabaga,  parsnips -  cup has 280 mg of potassium.  Lentils -  cup has 365 mg of potassium.  Mango - 1 each has 325 mg of potassium.  Milk (nonfat, low-fat, whole, buttermilk) - 1 cup has 350-380 mg of potassium.  Milk (chocolate) - 1 cup has 420 mg of potassium  Molasses - 1 Tbsp has 295 mg of potassium.  Mushrooms -  cup has 280 mg of potassium.  Nectarine - 1 each has 275 mg of potassium.  Nuts (almonds, peanuts, hazelnuts, [place], cashew, mixed) - 1 oz has 200 mg of potassium.  Nuts (pistachios) - 1 oz has 295 mg of potassium.  Orange - 1 fruit has 240 mg of potassium.  Orange juice -  cup has 235 mg of potassium.  Papaya -  medium fruit has 390 mg of potassium.  Peanut butter (chunky) - 2 Tbsp has 240 mg of potassium.  Peanut butter (smooth) - 2 Tbsp has 210 mg of potassium.  Pear - 1 medium (200 mg) of potassium.  Pomegranate - 1 whole fruit has 400 mg of potassium.  Pomegranate juice -  cup has 215 mg of potassium.  Pork - 3 oz has 350 mg of potassium.  Potato chips (salted) - 1 oz has 465 mg of potassium.  Potato (baked with skin) - 1 medium has 925 mg of potassium.  Potato (boiled) -  cup  has 255 mg of potassium.  Potato (Mashed) -  cup has 330 mg of potassium.  Prune juice -  cup has 370 mg of potassium.  Prunes - 5 have 305 mg of potassium.  Pudding (chocolate) -  cup has 230 mg of potassium.  Pumpkin (canned) -  cup has 250 mg of potassium.  Raisins (seedless) -  cup has 270 mg of potassium.  Seeds (sunflower or pumpkin) - 1 oz has 240 mg of potassium.  Soy milk - 1 cup has 300 mg of potassium.  Spinach (cooked) - 1/2 cup has 420 mg of potassium.  Spinach (canned) -  cup has 370 mg of potassium.  Sweet potato (baked with skin) - 1 medium has 450 mg of potassium.  Swiss chard -  cup has 480 mg of potassium.  Tomato or vegetable juice -  cup has 275 mg of potassium.  Tomato (sauce or puree) -  cup has 400-550 mg of potassium.  Tomato (raw) - 1 medium has 290 mg of potassium.  Tomato (canned) -  cup has 200-300 mg of potassium.  [place] - 3 oz has 250 mg of potassium.  Wheat germ - 1 oz has 250 mg of potassium.  Winter squash -  cup has 250 mg of potassium.  Yogurt (plain or fruited) - 6 oz has 260-435 mg of potassium.  Zucchini -  cup has 220 mg of potassium. Moderate in potassium The following foods and beverages have 50-200 mg of potassium per serving:  Apple - 1 fruit has 150 mg of potassium  Apple juice -  cup has 150 mg of potassium  Applesauce -  cup has 90 mg of potassium  Apricot nectar -  cup has 140 mg of potassium  Asparagus (small spears) -  cup has 155 mg of potassium  Asparagus (large spears) - 6 have 155 mg of potassium  Bagel (cinnamon raisin) - 1 four-inch bagel has 130 mg of potassium  Bagel (egg or plain) - 1 four- inch bagel has 70 mg of potassium  Beans (green) -  cup has 90 mg  of potassium  Beans (yellow) -  cup has 190 mg of potassium  Beer, regular - 12 oz has 100 mg of potassium  Beets (canned) -  cup has 125 mg of potassium  Blackberries -  cup has 115 mg of potassium  Blueberries -   cup has 60 mg of potassium  Bread (whole wheat) - 1 slice has 70 mg of potassium  Broccoli (raw) -  cup has 145 mg of potassium  Cabbage -  cup has 150 mg of potassium  Carrots (cooked or raw) -  cup has 180 mg of potassium  Cauliflower (raw) -  cup has 150 mg of potassium  Celery (raw) -  cup has 155 mg of potassium  Cereal, bran flakes -  cup has 120-150 mg of potassium  Cheese (cottage) -  cup has 110 mg of potassium  Cherries - 10 have 150 mg of potassium  Chocolate - 1 oz bar has 165 mg of potassium  Coffee (brewed) - 6 oz has 90 mg of potassium  Corn -  cup or 1 ear has 195 mg of potassium  Cucumbers -  cup has 80 mg of potassium  Egg - 1 large egg has 60 mg of potassium  Eggplant -  cup has 60 mg of potassium  Endive (raw) -  cup has 80 mg of potassium  English muffin - 1 has 65 mg of potassium  Fish (ocean perch) - 3 oz has 192 mg of potassium  Frankfurter, beef or pork - 1 has 75 mg of potassium  Fruit cocktail -  cup has 115 mg of potassium  Grape juice -  cup has 170 mg of potassium  Grapefruit -  fruit has 175 mg of potassium  Grapes -  cup has 155 mg of potassium  Greens: kale, turnip, collard -  cup has 110-150 mg of potassium  Ice cream or frozen yogurt (chocolate) -  cup has 175 mg of potassium  Ice cream or frozen yogurt (vanilla) -  cup has 120-150 mg of potassium  Lemons, limes - 1 each has 80 mg of potassium  Lettuce - 1 cup has 100 mg of potassium  Mixed vegetables -  cup has 150 mg of potassium  Mushrooms, raw -  cup has 110 mg of potassium  Nuts (walnuts, pecans, or macadamia) - 1 oz has 125 mg of potassium  Oatmeal -  cup has 80 mg of potassium  Okra -  cup has 110 mg of potassium  Onions -  cup has 120 mg of potassium  Peach - 1 has 185 mg of potassium  Peaches (canned) -  cup has 120 mg of potassium  Pears (canned) -  cup has 120 mg of potassium  Peas, green (frozen) -  cup has 90 mg of  potassium  Peppers (Green) -  cup has 130 mg of potassium  Peppers (Red) -  cup has 160 mg of potassium  Pineapple juice -  cup has 165 mg of potassium  Pineapple (fresh or canned) -  cup has 100 mg of potassium  Plums - 1 has 105 mg of potassium  Pudding, vanilla -  cup has 150 mg of potassium  Raspberries -  cup has 90 mg of potassium  Rhubarb -  cup has 115 mg of potassium  Rice, wild -  cup has 80 mg of potassium  Shrimp - 3 oz has 155 mg of potassium  Spinach (raw) - 1 cup has 170 mg of  potassium  Strawberries -  cup has 125 mg of potassium  Summer squash -  cup has 175-200 mg of potassium  Swiss chard (raw) - 1 cup has 135 mg of potassium  Tangerines - 1 fruit has 140 mg of potassium  Tea, brewed - 6 oz has 65 mg of potassium  Turnips -  cup has 140 mg of potassium  Watermelon -  cup has 85 mg of potassium  Wine (Red, table) - 5 oz has 180 mg of potassium  Wine (White, table) - 5 oz 100 mg of potassium Low in potassium The following foods and beverages have less than 50 mg of potassium per serving.  Bread (white) - 1 slice has 30 mg of potassium  Carbonated beverages - 12 oz has less than 5 mg of potassium  Cheese - 1 oz has 20-30 mg of potassium  Cranberries -  cup has 45 mg of potassium  Cranberry juice cocktail -  cup has 20 mg of potassium  Fats and oils - 1 Tbsp has less than 5 mg of potassium  Hummus - 1 Tbsp has 32 mg of potassium  Nectar (papaya, mango, or pear) -  cup has 35 mg of potassium  Rice (white or brown) -  cup has 50 mg of potassium  Spaghetti or macaroni (cooked) -  cup has 30 mg of potassium  [place], flour or corn - 1 has 50 mg of potassium  Waffle - 1 four-inch waffle has 50 mg of potassium  Water chestnuts -  cup has 40 mg of potassium Summary  Potassium is a mineral found in many foods and drinks. It affects how the heart works, and helps keep fluids and minerals balanced in the body.  The  amount of potassium you need each day depends on your age and any existing medical conditions you may have. Your health care provider or dietitian may recommend an amount of potassium that you should have each day. This information is not intended to replace advice given to you by your health care provider. Make sure you discuss any questions you have with your health care provider. Document Revised: 12/28/2016 Document Reviewed: 04/11/2016 Elsevier Patient Education  2021 [place], Erma Heritage, [place]  03/04/2020 5:13 PM    [facility] [address] [place], [place] [address] Phone: [phone] Fax: [phone]

## 2020-03-04 NOTE — Patient Instructions (Addendum)
Medication Instructions:  Your physician recommends that you continue on your current medications as directed. Please refer to the Current Medication list given to you today.  Take Potassium 10 meq 3 Tablets 3 Times Daily   *If you need a refill on your cardiac medications before your next appointment, please call your pharmacy*   Lab Work: Your physician recommends that you return for lab work in: 2 Weeks   If you have labs (blood work) drawn today and your tests are completely normal, you will receive your results only by: Marland Kitchen MyChart Message (if you have MyChart) OR . A paper copy in the mail If you have any lab test that is abnormal or we need to change your treatment, we will call you to review the results.   Testing/Procedures: NONE    Follow-Up: At [facility], you and your health needs are our priority.  As part of our continuing mission to provide you with exceptional heart care, we have created designated Provider Care Teams.  These Care Teams include your primary Cardiologist (physician) and Advanced Practice Providers (APPs -  Physician Assistants and Nurse Practitioners) who all work together to provide you with the care you need, when you need it.  We recommend signing up for the patient portal called "MyChart".  Sign up information is provided on this After Visit Summary.  MyChart is used to connect with patients for Virtual Visits (Telemedicine).  Patients are able to view lab/test results, encounter notes, upcoming appointments, etc.  Non-urgent messages can be sent to your provider as well.   To learn more about what you can do with MyChart, go to [email or web address].    Your next appointment:   6 month(s)  The format for your next appointment:   In Person  Provider:   Rozann Lesches, MD   Other Instructions Thank you for choosing [facility]!    Potassium Content of Foods  Potassium is a mineral found in many foods and drinks. It affects  how the heart works, and helps keep fluids and minerals balanced in the body. The amount of potassium you need each day depends on your age and any medical conditions you may have. Talk to your health care provider or dietitian about how much potassium you need. The following lists of foods provide the general serving size for foods and the approximate amount of potassium in each serving, listed in milligrams (mg). Actual values may vary depending on the product and how it is processed. High in potassium The following foods and beverages have 200 mg or more of potassium per serving:  Apricots (raw) - 2 have 200 mg of potassium.  Apricots (dry) - 5 have 200 mg of potassium.  Artichoke - 1 medium has 345 mg of potassium.  Avocado -  fruit has 245 mg of potassium.  Banana - 1 medium fruit has 425 mg of potassium.  [place] or baked beans (canned) -  cup has 280 mg of potassium.  White beans (canned) -  cup has 595 mg potassium.  Beef roast - 3 oz has 320 mg of potassium.  Ground beef - 3 oz has 270 mg of potassium.  Beets (raw or cooked) -  cup has 260 mg of potassium.  Bran muffin - 2 oz has 300 mg of potassium.  Broccoli (cooked) -  cup has 230 mg of potassium.  Brussels sprouts -  cup has 250 mg of potassium.  Cantaloupe -  cup has 215 mg of potassium.  Cereal, 100% bran -  cup has 200-400 mg of potassium.  Cheeseburger -1 single fast food burger has 225-400 mg of potassium.  Chicken - 3 oz has 220 mg of potassium.  Clams (canned) - 3 oz has 535 mg of potassium.  Crab - 3 oz has 225 mg of potassium.  Dates - 5 have 270 mg of potassium.  Dried beans and peas -  cup has 300-475 mg of potassium.  Figs (dried) - 2 have 260 mg of potassium.  Fish (halibut, tuna, cod, snapper) - 3 oz has 480 mg of potassium.  Fish (salmon, haddock, swordfish, perch) - 3 oz has 300 mg of potassium.  Fish (tuna, canned) - 3 oz has 200 mg of potassium.  [place] fries (fast food) - 3  oz has 470 mg of potassium.  Granola with fruit and nuts -  cup has 200 mg of potassium.  Grapefruit juice -  cup has 200 mg of potassium.  Honeydew melon -  cup has 200 mg of potassium.  Kale (raw) - 1 cup has 300 mg of potassium.  Kiwi - 1 medium fruit has 240 mg of potassium.  Kohlrabi, rutabaga, parsnips -  cup has 280 mg of potassium.  Lentils -  cup has 365 mg of potassium.  Mango - 1 each has 325 mg of potassium.  Milk (nonfat, low-fat, whole, buttermilk) - 1 cup has 350-380 mg of potassium.  Milk (chocolate) - 1 cup has 420 mg of potassium  Molasses - 1 Tbsp has 295 mg of potassium.  Mushrooms -  cup has 280 mg of potassium.  Nectarine - 1 each has 275 mg of potassium.  Nuts (almonds, peanuts, hazelnuts, [place], cashew, mixed) - 1 oz has 200 mg of potassium.  Nuts (pistachios) - 1 oz has 295 mg of potassium.  Orange - 1 fruit has 240 mg of potassium.  Orange juice -  cup has 235 mg of potassium.  Papaya -  medium fruit has 390 mg of potassium.  Peanut butter (chunky) - 2 Tbsp has 240 mg of potassium.  Peanut butter (smooth) - 2 Tbsp has 210 mg of potassium.  Pear - 1 medium (200 mg) of potassium.  Pomegranate - 1 whole fruit has 400 mg of potassium.  Pomegranate juice -  cup has 215 mg of potassium.  Pork - 3 oz has 350 mg of potassium.  Potato chips (salted) - 1 oz has 465 mg of potassium.  Potato (baked with skin) - 1 medium has 925 mg of potassium.  Potato (boiled) -  cup has 255 mg of potassium.  Potato (Mashed) -  cup has 330 mg of potassium.  Prune juice -  cup has 370 mg of potassium.  Prunes - 5 have 305 mg of potassium.  Pudding (chocolate) -  cup has 230 mg of potassium.  Pumpkin (canned) -  cup has 250 mg of potassium.  Raisins (seedless) -  cup has 270 mg of potassium.  Seeds (sunflower or pumpkin) - 1 oz has 240 mg of potassium.  Soy milk - 1 cup has 300 mg of potassium.  Spinach (cooked) - 1/2 cup has 420 mg  of potassium.  Spinach (canned) -  cup has 370 mg of potassium.  Sweet potato (baked with skin) - 1 medium has 450 mg of potassium.  Swiss chard -  cup has 480 mg of potassium.  Tomato or vegetable juice -  cup has 275 mg of potassium.  Tomato (sauce or puree) -  cup has 400-550 mg of potassium.  Tomato (raw) - 1 medium has 290 mg of potassium.  Tomato (canned) -  cup has 200-300 mg of potassium.  [place] - 3 oz has 250 mg of potassium.  Wheat germ - 1 oz has 250 mg of potassium.  Winter squash -  cup has 250 mg of potassium.  Yogurt (plain or fruited) - 6 oz has 260-435 mg of potassium.  Zucchini -  cup has 220 mg of potassium. Moderate in potassium The following foods and beverages have 50-200 mg of potassium per serving:  Apple - 1 fruit has 150 mg of potassium  Apple juice -  cup has 150 mg of potassium  Applesauce -  cup has 90 mg of potassium  Apricot nectar -  cup has 140 mg of potassium  Asparagus (small spears) -  cup has 155 mg of potassium  Asparagus (large spears) - 6 have 155 mg of potassium  Bagel (cinnamon raisin) - 1 four-inch bagel has 130 mg of potassium  Bagel (egg or plain) - 1 four- inch bagel has 70 mg of potassium  Beans (green) -  cup has 90 mg of potassium  Beans (yellow) -  cup has 190 mg of potassium  Beer, regular - 12 oz has 100 mg of potassium  Beets (canned) -  cup has 125 mg of potassium  Blackberries -  cup has 115 mg of potassium  Blueberries -  cup has 60 mg of potassium  Bread (whole wheat) - 1 slice has 70 mg of potassium  Broccoli (raw) -  cup has 145 mg of potassium  Cabbage -  cup has 150 mg of potassium  Carrots (cooked or raw) -  cup has 180 mg of potassium  Cauliflower (raw) -  cup has 150 mg of potassium  Celery (raw) -  cup has 155 mg of potassium  Cereal, bran flakes -  cup has 120-150 mg of potassium  Cheese (cottage) -  cup has 110 mg of potassium  Cherries - 10 have 150 mg of  potassium  Chocolate - 1 oz bar has 165 mg of potassium  Coffee (brewed) - 6 oz has 90 mg of potassium  Corn -  cup or 1 ear has 195 mg of potassium  Cucumbers -  cup has 80 mg of potassium  Egg - 1 large egg has 60 mg of potassium  Eggplant -  cup has 60 mg of potassium  Endive (raw) -  cup has 80 mg of potassium  English muffin - 1 has 65 mg of potassium  Fish (ocean perch) - 3 oz has 192 mg of potassium  Frankfurter, beef or pork - 1 has 75 mg of potassium  Fruit cocktail -  cup has 115 mg of potassium  Grape juice -  cup has 170 mg of potassium  Grapefruit -  fruit has 175 mg of potassium  Grapes -  cup has 155 mg of potassium  Greens: kale, turnip, collard -  cup has 110-150 mg of potassium  Ice cream or frozen yogurt (chocolate) -  cup has 175 mg of potassium  Ice cream or frozen yogurt (vanilla) -  cup has 120-150 mg of potassium  Lemons, limes - 1 each has 80 mg of potassium  Lettuce - 1 cup has 100 mg of potassium  Mixed vegetables -  cup has 150 mg of potassium  Mushrooms, raw -  cup has 110 mg of potassium  Nuts (walnuts, pecans, or  macadamia) - 1 oz has 125 mg of potassium  Oatmeal -  cup has 80 mg of potassium  Okra -  cup has 110 mg of potassium  Onions -  cup has 120 mg of potassium  Peach - 1 has 185 mg of potassium  Peaches (canned) -  cup has 120 mg of potassium  Pears (canned) -  cup has 120 mg of potassium  Peas, green (frozen) -  cup has 90 mg of potassium  Peppers (Green) -  cup has 130 mg of potassium  Peppers (Red) -  cup has 160 mg of potassium  Pineapple juice -  cup has 165 mg of potassium  Pineapple (fresh or canned) -  cup has 100 mg of potassium  Plums - 1 has 105 mg of potassium  Pudding, vanilla -  cup has 150 mg of potassium  Raspberries -  cup has 90 mg of potassium  Rhubarb -  cup has 115 mg of potassium  Rice, wild -  cup has 80 mg of potassium  Shrimp - 3 oz has 155 mg of  potassium  Spinach (raw) - 1 cup has 170 mg of potassium  Strawberries -  cup has 125 mg of potassium  Summer squash -  cup has 175-200 mg of potassium  Swiss chard (raw) - 1 cup has 135 mg of potassium  Tangerines - 1 fruit has 140 mg of potassium  Tea, brewed - 6 oz has 65 mg of potassium  Turnips -  cup has 140 mg of potassium  Watermelon -  cup has 85 mg of potassium  Wine (Red, table) - 5 oz has 180 mg of potassium  Wine (White, table) - 5 oz 100 mg of potassium Low in potassium The following foods and beverages have less than 50 mg of potassium per serving.  Bread (white) - 1 slice has 30 mg of potassium  Carbonated beverages - 12 oz has less than 5 mg of potassium  Cheese - 1 oz has 20-30 mg of potassium  Cranberries -  cup has 45 mg of potassium  Cranberry juice cocktail -  cup has 20 mg of potassium  Fats and oils - 1 Tbsp has less than 5 mg of potassium  Hummus - 1 Tbsp has 32 mg of potassium  Nectar (papaya, mango, or pear) -  cup has 35 mg of potassium  Rice (white or brown) -  cup has 50 mg of potassium  Spaghetti or macaroni (cooked) -  cup has 30 mg of potassium  Tortilla, flour or corn - 1 has 50 mg of potassium  Waffle - 1 four-inch waffle has 50 mg of potassium  Water chestnuts -  cup has 40 mg of potassium Summary  Potassium is a mineral found in many foods and drinks. It affects how the heart works, and helps keep fluids and minerals balanced in the body.  The amount of potassium you need each day depends on your age and any existing medical conditions you may have. Your health care provider or dietitian may recommend an amount of potassium that you should have each day. This information is not intended to replace advice given to you by your health care provider. Make sure you discuss any questions you have with your health care provider. Document Revised: 12/28/2016 Document Reviewed: 04/11/2016 Elsevier Patient Education  [place].

## 2020-03-10 ENCOUNTER — Other Ambulatory Visit: Payer: Self-pay

## 2020-03-10 ENCOUNTER — Encounter (HOSPITAL_COMMUNITY)
Admission: RE | Admit: 2020-03-10 | Discharge: 2020-03-10 | Disposition: A | Discharge status: Home / Self Care | Payer: HMO | Source: Ambulatory Visit | Attending: Nephrology | Admitting: Nephrology

## 2020-03-10 DIAGNOSIS — D631 Anemia in chronic kidney disease: Secondary | ICD-10-CM | POA: Insufficient documentation

## 2020-03-10 DIAGNOSIS — N184 Chronic kidney disease, stage 4 (severe): Secondary | ICD-10-CM | POA: Insufficient documentation

## 2020-03-10 LAB — PROTEIN / CREATININE RATIO, URINE
Creatinine, Urine: 54.8 mg/dL
Protein Creatinine Ratio: 1.44 mg/mg{Cre} — ABNORMAL HIGH (ref 0.00–0.15)
Total Protein, Urine: 79 mg/dL

## 2020-03-10 LAB — POCT HEMOGLOBIN-HEMACUE: Hemoglobin: 11.7 g/dL — ABNORMAL LOW (ref 12.0–15.0)

## 2020-03-17 ENCOUNTER — Encounter: Payer: Self-pay | Admitting: Nurse Practitioner

## 2020-03-17 ENCOUNTER — Ambulatory Visit (INDEPENDENT_AMBULATORY_CARE_PROVIDER_SITE_OTHER): Payer: HMO | Admitting: Nurse Practitioner

## 2020-03-17 ENCOUNTER — Other Ambulatory Visit: Payer: Self-pay

## 2020-03-17 DIAGNOSIS — Z01818 Encounter for other preprocedural examination: Secondary | ICD-10-CM | POA: Insufficient documentation

## 2020-03-17 NOTE — Assessment & Plan Note (Addendum)
-  here today for surgical clearance -labs ordered today -she has hx of HOCM -last echo from Aug 2021 states "Echocardiogram: 08/2019 IMPRESSIONS    1. There is mild SAM of the anterior mitral valve leaflet in the setting  of severe symmertic LVH and hyperdynamic LV function. Dynamic LVOT  gradient of 38 mmHg. Marland Kitchen Left ventricular ejection fraction, by estimation,  is 70 to 75%. The left ventricle has  hyperdynamic function. The left ventricle has no regional wall motion  abnormalities. There is severe left ventricular hypertrophy. Left  ventricular diastolic parameters are consistent with Grade I diastolic  dysfunction (impaired relaxation). Elevated  left atrial pressure.  2. Right ventricular systolic function is normal. The right ventricular  size is normal.  3. Left atrial size was severely dilated.  4. The mitral valve is normal in structure. Trivial mitral valve  regurgitation. Mild mitral stenosis.  5. The aortic valve is tricuspid. Aortic valve regurgitation is not  visualized. No aortic stenosis is present.  6. The inferior vena cava is normal in size with greater than 50%  respiratory variability, suggesting right atrial pressure of 3 mmHg. "  Recommend cardiology clear patient as well since she has extensive cardiac history.

## 2020-03-17 NOTE — Progress Notes (Signed)
Acute Office Visit  Subjective:    Patient ID: Krista Strickland, female    DOB: 1950/05/03, 70 y.o.   MRN: [id number]  Chief Complaint  Patient presents with  . Procedure    Surgical clearance for Eye Surgery - scheduled for 03/31/20 at [facility].     HPI Patient is in today for surgical clearance for eye surgery. She has no paperwork detailing what the surgeon wants for clearance. She has a hx of HOCM and last echo was in August 2021.  She has swollen legs, but denies SOB, CP, or wheezing.  Past Medical History:  Diagnosis Date  . Anemia   . Arthritis   . CKD (chronic kidney disease) stage 3, GFR 30-59 ml/min (HCC)   . Diabetes mellitus, type 2 ([place])   . Essential hypertension   . GERD (gastroesophageal reflux disease)   . Gout   . History of MRSA infection 03/2009  . History of stroke 2013  . HOCM (hypertrophic obstructive cardiomyopathy) ([place])   . Hypercalcemia 2017   Managed by nephrology  . Hyperlipidemia   . Obesity   . Psoriasis   . Uterine cancer ([place])   . 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 ABDOMINAL HYSTERECTOMY    . APPENDECTOMY  2012  . COLONOSCOPY WITH PROPOFOL N/A 08/24/2016   Procedure: COLONOSCOPY WITH PROPOFOL;  Surgeon: Rogene Houston, MD;  Location: [facility] ENDO SUITE;  Service: Endoscopy;  Laterality: N/A;  10:30  . Excison of right breast cyst    . LAPAROSCOPIC SALPINGO OOPHERECTOMY Right 04/22/2012   Procedure: LAPAROSCOPIC SALPINGO OOPHORECTOMY;  Surgeon: Jonnie Kind, MD;  Location: [facility] ORS;  Service: Gynecology;  Laterality: Right;  end 11:17  . MASS EXCISION N/A 04/22/2012   Procedure: EXCISION SKIN TAGS NECK AND HEAD;  Surgeon: Jonnie Kind, MD;  Location: [facility] ORS;  Service: Gynecology;  Laterality: N/A;  start 11:19  . PARTIAL HYSTERECTOMY    . POLYPECTOMY  08/24/2016   Procedure: POLYPECTOMY;  Surgeon: Rogene Houston, MD;  Location: [facility] END[facility];  Service:  Endoscopy;;  colon  . Right neck biopsy      Family History  Problem Relation Age of Onset  . Hypertension Brother   . Gout Brother   . Prostate cancer Brother   . Hypertension Brother   . Prostate cancer Brother   . Gout Brother   . Hypertension Sister   . Gout Sister   . Leukemia Sister 57  . Pancreatic cancer Sister 40  . Gout Sister   . Prostate cancer Brother   . Diabetes Neg Hx     Social History   Socioeconomic History  . Marital status: Married    Spouse name: Not on file  . Number of children: 1  . Years of education: Not on file  . Highest education level: 11th grade  Occupational History  . Occupation: disabled     Fish farm manager: UNEMPLOYED  Tobacco Use  . Smoking status: Former Smoker    Packs/day: 0.25    Years: 12.00    Pack years: 3.00    Quit date: 07/16/1978    Years since quitting: 41.6  . Smokeless tobacco: Never Used  Vaping Use  . Vaping Use: Never used  Substance and Sexual Activity  . Alcohol use: No  . Drug use: No  . Sexual activity: Yes    Birth control/protection: Surgical  Other Topics Concern  .  Not on file  Social History Narrative  . Not on file   Social Determinants of Health   Financial Resource Strain: Low Risk   . Difficulty of Paying Living Expenses: Not hard at all  Food Insecurity: No Food Insecurity  . Worried About Charity fundraiser in the Last Year: Never true  . Ran Out of Food in the Last Year: Never true  Transportation Needs: No Transportation Needs  . Lack of Transportation (Medical): No  . Lack of Transportation (Non-Medical): No  Physical Activity: Inactive  . Days of Exercise per Week: 0 days  . Minutes of Exercise per Session: 0 min  Stress: No Stress Concern Present  . Feeling of Stress : Only a little  Social Connections: Socially Isolated  . Frequency of Communication with Friends and Family: Never  . Frequency of Social Gatherings with Friends and Family: Never  . Attends Religious Services: Never   . Active Member of Clubs or Organizations: No  . Attends Archivist Meetings: Never  . Marital Status: Married  Human resources officer Violence: Not At Risk  . Fear of Current or Ex-Partner: No  . Emotionally Abused: No  . Physically Abused: No  . Sexually Abused: No    Outpatient Medications Prior to Visit  Medication Sig Dispense Refill  . aspirin 325 MG tablet Take 1 tablet (325 mg total) by mouth every morning. 30 tablet 0  . DILT-XR 180 MG 24 hr capsule Take 1 capsule by mouth once daily 90 capsule 0  . docusate sodium (COLACE) 100 MG capsule Take 100 mg by mouth daily as needed for mild constipation or moderate constipation.     Marland Kitchen epoetin alfa (EPOGEN) 3000 UNIT/ML injection Inject 3,000 Units into the vein every 14 (fourteen) days.     Marland Kitchen ezetimibe (ZETIA) 10 MG tablet Take 1 tablet (10 mg total) by mouth daily. 90 tablet 3  . hydrALAZINE (APRESOLINE) 25 MG tablet TAKE 1 TABLET BY MOUTH TWICE DAILY AT  9  AM  AND  9  PM 180 tablet 0  . HYDROcodone-acetaminophen (NORCO/VICODIN) 5-325 MG tablet One tablet every six hours for pain.  Limit 7 days. 28 tablet 0  . metolazone (ZAROXOLYN) 5 MG tablet Take 1 tablet by mouth once daily 90 tablet 0  . metoprolol tartrate (LOPRESSOR) 100 MG tablet Take 1 tablet by mouth twice daily (Patient taking differently: 50 mg 2 (two) times daily.) 180 tablet 0  . midodrine (PROAMATINE) 5 MG tablet Take 5 mg by mouth 3 (three) times daily.    Marland Kitchen NOVOLIN 70/30 RELION (70-30) 100 UNIT/ML injection INJECT 190 UNITS SUBCUTANEOUSLY ONCE DAILY WITH BREAKFAST 70 mL 0  . ONETOUCH ULTRA test strip USE 1 STRIP TO CHECK GLUCOSE TWICE DAILY 200 each 0  . potassium chloride (KLOR-CON) 10 MEQ tablet Take 3 tablets (30 mEq total) by mouth 3 (three) times daily. 810 tablet 3  . pravastatin (PRAVACHOL) 20 MG tablet TAKE 1 TABLET BY MOUTH ONCE DAILY BEFORE BREAKFAST 90 tablet 1  . torsemide (DEMADEX) 20 MG tablet Take 2 tablets (40 mg total) by mouth 2 (two) times  daily. 56 tablet 0   No facility-administered medications prior to visit.    Allergies  Allergen Reactions  . Benazepril Swelling  . Metronidazole Hives  . Mobic [Meloxicam] Hives  . Penicillins Hives and Swelling  . Sulfonamide Derivatives Hives    Review of Systems  Constitutional: Negative.   Respiratory: Negative.   Cardiovascular: Positive for leg swelling. Negative for  chest pain and palpitations.       Objective:    Physical Exam Constitutional:      Appearance: She is obese.  Cardiovascular:     Rate and Rhythm: Normal rate and regular rhythm.     Pulses: Normal pulses.     Heart sounds: Murmur heard.    Pulmonary:     Effort: Pulmonary effort is normal.     Breath sounds: Normal breath sounds.  Musculoskeletal:     Right lower leg: Edema present.     Left lower leg: Edema present.     Comments: Non-pitting edema  Neurological:     Mental Status: She is alert.     BP (!) 148/67   Pulse 68   Temp 98.9 F (37.2 C)   Resp 16   Ht '5\' 3"'  (1.6 m)   Wt 184 lb (83.5 kg)   SpO2 94%   BMI 32.59 kg/m  Wt Readings from Last 3 Encounters:  03/17/20 184 lb (83.5 kg)  03/04/20 186 lb (84.4 kg)  02/17/20 180 lb (81.6 kg)    There are no preventive care reminders to display for this patient.  There are no preventive care reminders to display for this patient.   Lab Results  Component Value Date   TSH 1.411 07/22/2019   Lab Results  Component Value Date   WBC 9.7 02/25/2020   HGB 11.7 (L) 03/10/2020   HCT 40.7 02/25/2020   MCV 87.0 02/25/2020   PLT 322 02/25/2020   Lab Results  Component Value Date   NA 134 (L) 02/25/2020   K 3.0 (L) 02/25/2020   CO2 28 02/25/2020   GLUCOSE 137 (H) 02/25/2020   BUN 88 (H) 02/25/2020   CREATININE 2.29 (H) 02/25/2020   BILITOT 0.5 07/22/2019   ALKPHOS 87 07/22/2019   AST 19 07/22/2019   ALT 20 07/22/2019   PROT 7.4 07/22/2019   ALBUMIN 3.9 02/25/2020   CALCIUM 10.0 02/25/2020   ANIONGAP 13 02/25/2020    GFR 30.98 (L) 12/31/2018   Lab Results  Component Value Date   CHOL 155 02/11/2019   Lab Results  Component Value Date   HDL 32 (L) 02/11/2019   Lab Results  Component Value Date   LDLCALC 98 02/11/2019   Lab Results  Component Value Date   TRIG 150 (H) 02/11/2019   Lab Results  Component Value Date   CHOLHDL 4.8 02/11/2019   Lab Results  Component Value Date   HGBA1C 6.8 (A) 12/21/2019       Assessment & Plan:   Problem List Items Addressed This Visit      Other   Pre-op examination    -here today for surgical clearance -labs ordered today -she has hx of HOCM -last echo from Aug 2021 states "Echocardiogram: 08/2019 IMPRESSIONS    1. There is mild SAM of the anterior mitral valve leaflet in the setting  of severe symmertic LVH and hyperdynamic LV function. Dynamic LVOT  gradient of 38 mmHg. Marland Kitchen Left ventricular ejection fraction, by estimation,  is 70 to 75%. The left ventricle has  hyperdynamic function. The left ventricle has no regional wall motion  abnormalities. There is severe left ventricular hypertrophy. Left  ventricular diastolic parameters are consistent with Grade I diastolic  dysfunction (impaired relaxation). Elevated  left atrial pressure.  2. Right ventricular systolic function is normal. The right ventricular  size is normal.  3. Left atrial size was severely dilated.  4. The mitral valve is normal in  structure. Trivial mitral valve  regurgitation. Mild mitral stenosis.  5. The aortic valve is tricuspid. Aortic valve regurgitation is not  visualized. No aortic stenosis is present.  6. The inferior vena cava is normal in size with greater than 50%  respiratory variability, suggesting right atrial pressure of 3 mmHg. "  Recommend cardiology clear patient as well since she has extensive cardiac history.      Relevant Orders   CBC with Differential/Platelet   CMP14+EGFR   Hemoglobin A1c       No orders of the defined types were  placed in this encounter.    Noreene Larsson, NP

## 2020-03-18 LAB — CMP14+EGFR
ALT: 27 IU/L (ref 0–32)
AST: 19 IU/L (ref 0–40)
Albumin/Globulin Ratio: 1.4 (ref 1.2–2.2)
Albumin: 4.1 g/dL (ref 3.8–4.8)
Alkaline Phosphatase: 79 IU/L (ref 44–121)
BUN/Creatinine Ratio: 40 — ABNORMAL HIGH (ref 12–28)
BUN: 89 mg/dL (ref 8–27)
Bilirubin Total: <0.2 mg/dL (ref 0.0–1.2)
CO2: 25 mmol/L (ref 20–29)
Calcium: 10.1 mg/dL (ref 8.7–10.3)
Chloride: 93 mmol/L — ABNORMAL LOW (ref 96–106)
Creatinine, Ser: 2.25 mg/dL — ABNORMAL HIGH (ref 0.57–1.00)
GFR calc Af Amer: 25 mL/min/{1.73_m2} — ABNORMAL LOW (ref >59)
GFR calc non Af Amer: 22 mL/min/{1.73_m2} — ABNORMAL LOW (ref >59)
Globulin, Total: 3 g/dL (ref 1.5–4.5)
Glucose: 132 mg/dL — ABNORMAL HIGH (ref 65–99)
Potassium: 3.7 mmol/L (ref 3.5–5.2)
Sodium: 138 mmol/L (ref 134–144)
Total Protein: 7.1 g/dL (ref 6.0–8.5)

## 2020-03-18 LAB — CBC WITH DIFFERENTIAL/PLATELET
Basophils Absolute: 0.1 10*3/uL (ref 0.0–0.2)
Basos: 1 %
EOS (ABSOLUTE): 0.2 10*3/uL (ref 0.0–0.4)
Eos: 2 %
Hematocrit: 39.3 % (ref 34.0–46.6)
Hemoglobin: 12.7 g/dL (ref 11.1–15.9)
Immature Grans (Abs): 0 10*3/uL (ref 0.0–0.1)
Immature Granulocytes: 0 %
Lymphocytes Absolute: 2.8 10*3/uL (ref 0.7–3.1)
Lymphs: 30 %
MCH: 27.3 pg (ref 26.6–33.0)
MCHC: 32.3 g/dL (ref 31.5–35.7)
MCV: 85 fL (ref 79–97)
Monocytes Absolute: 1 10*3/uL — ABNORMAL HIGH (ref 0.1–0.9)
Monocytes: 10 %
Neutrophils Absolute: 5.3 10*3/uL (ref 1.4–7.0)
Neutrophils: 57 %
Platelets: 256 10*3/uL (ref 150–450)
RBC: 4.65 x10E6/uL (ref 3.77–5.28)
RDW: 15.6 % — ABNORMAL HIGH (ref 11.7–15.4)
WBC: 9.4 10*3/uL (ref 3.4–10.8)

## 2020-03-18 LAB — HEMOGLOBIN A1C
Est. average glucose Bld gHb Est-mCnc: 157 mg/dL
Hgb A1c MFr Bld: 7.1 % — ABNORMAL HIGH (ref 4.8–5.6)

## 2020-03-18 NOTE — Progress Notes (Signed)
Labs stable from previous. A1c is stable. No change in meds.

## 2020-03-21 DIAGNOSIS — M79675 Pain in left toe(s): Secondary | ICD-10-CM | POA: Diagnosis not present

## 2020-03-21 DIAGNOSIS — B351 Tinea unguium: Secondary | ICD-10-CM | POA: Diagnosis not present

## 2020-03-21 DIAGNOSIS — M79674 Pain in right toe(s): Secondary | ICD-10-CM | POA: Diagnosis not present

## 2020-03-21 DIAGNOSIS — E1151 Type 2 diabetes mellitus with diabetic peripheral angiopathy without gangrene: Secondary | ICD-10-CM | POA: Diagnosis not present

## 2020-03-22 ENCOUNTER — Other Ambulatory Visit: Payer: Self-pay

## 2020-03-22 ENCOUNTER — Ambulatory Visit (INDEPENDENT_AMBULATORY_CARE_PROVIDER_SITE_OTHER): Payer: HMO | Admitting: Endocrinology

## 2020-03-22 VITALS — BP 150/80 | HR 72 | Ht 63.0 in | Wt 186.0 lb

## 2020-03-22 DIAGNOSIS — E1122 Type 2 diabetes mellitus with diabetic chronic kidney disease: Secondary | ICD-10-CM | POA: Diagnosis not present

## 2020-03-22 DIAGNOSIS — Z794 Long term (current) use of insulin: Secondary | ICD-10-CM | POA: Diagnosis not present

## 2020-03-22 DIAGNOSIS — N184 Chronic kidney disease, stage 4 (severe): Secondary | ICD-10-CM | POA: Diagnosis not present

## 2020-03-22 MED ORDER — NOVOLIN 70/30 RELION (70-30) 100 UNIT/ML ~~LOC~~ SUSP: 95.0000 [IU] | Freq: Every day | SUBCUTANEOUS | 3 refills | Status: DC

## 2020-03-22 MED ORDER — ONETOUCH ULTRA VI STRP: 1.0000 | ORAL_STRIP | Freq: Two times a day (BID) | 3 refills | Status: DC

## 2020-03-22 NOTE — Progress Notes (Signed)
Subjective:    Patient ID: Krista Strickland, female    DOB: 06/26/1950, 70 y.o.   MRN: [id number]  HPI Pt returns for f/u of diabetes.   DM type: insulin-requiring type 2 Dx'ed: 4650 Complications: PN, stage 4 CRI and CVA.  Therapy: insulin since 2010 GDM: never DKA: never Severe hypoglycemia: never.   Pancreatitis: never.   SDOH: she cannot afford insulin analogs Other: she declines weight loss surgery; she had cutaneous reactions to NPH and lantus; she has declined MDI's. so we had to try 70/30 (the NPH component caused no reaction this time).  She takes 70/30, due to the pattern of cbg's; fructosamine converts to A1c 0.6 higher than A1c itself Interval history:  no cbg record, but states cbg's vary from 65-239.  She checks fasting only.  pt states she feels well in general.  She says she never misses the insulin.  no recent steroids. She takes 100 units qam (confirmed by readback) Pt also has small multinodular goiter (euthyroid; f/u US in 2017 showed no change, with no thyroid nodule meeting criteria for biopsy or surveillance; she is euthyroid off rx).  Past Medical History:  Diagnosis Date  . Anemia   . Arthritis   . CKD (chronic kidney disease) stage 3, GFR 30-59 ml/min (HCC)   . Diabetes mellitus, type 2 ([place])   . Essential hypertension   . GERD (gastroesophageal reflux disease)   . Gout   . History of MRSA infection 03/2009  . History of stroke 2013  . HOCM (hypertrophic obstructive cardiomyopathy) ([place])   . Hypercalcemia 2017   Managed by nephrology  . Hyperlipidemia   . Obesity   . Psoriasis   . Uterine cancer ([place])   . 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 ABDOMINAL HYSTERECTOMY    . APPENDECTOMY  2012  . COLONOSCOPY WITH PROPOFOL N/A 08/24/2016   Procedure: COLONOSCOPY WITH PROPOFOL;  Surgeon: Rogene Houston, MD;  [facility];  Service: Endoscopy;  Laterality: N/A;  10:30  . Excison of right  breast cyst    . LAPAROSCOPIC SALPINGO OOPHERECTOMY Right 04/22/2012   Procedure: LAPAROSCOPIC SALPINGO OOPHORECTOMY;  Surgeon: Jonnie Kind, MD;  Location: [facility];  Service: Gynecology;  Laterality: Right;  end 11:17  . MASS EXCISION N/A 04/22/2012   Procedure: EXCISION SKIN TAGS NECK AND HEAD;  Surgeon: Jonnie Kind, MD;  Location: [facility];  Service: Gynecology;  Laterality: N/A;  start 11:19  . PARTIAL HYSTERECTOMY    . POLYPECTOMY  08/24/2016   Procedure: POLYPECTOMY;  Surgeon: Rogene Houston, MD;  [facility];  Service: Endoscopy;;  colon  . Right neck biopsy      Social History   Socioeconomic History  . Marital status: Married    Spouse name: Not on file  . Number of children: 1  . Years of education: Not on file  . Highest education level: 11th grade  Occupational History  . Occupation: disabled     Fish farm manager: UNEMPLOYED  Tobacco Use  . Smoking status: Former Smoker    Packs/day: 0.25    Years: 12.00    Pack years: 3.00    Quit date: 07/16/1978    Years since quitting: 41.7  . Smokeless tobacco: Never Used  Vaping Use  . Vaping Use: Never used  Substance and Sexual Activity  . Alcohol use: No  . Drug use: No  . Sexual activity: Yes  Birth control/protection: Surgical  Other Topics Concern  . Not on file  Social History Narrative  . Not on file   Social Determinants of Health   Financial Resource Strain: Low Risk   . Difficulty of Paying Living Expenses: Not hard at all  Food Insecurity: No Food Insecurity  . Worried About Charity fundraiser in the Last Year: Never true  . Ran Out of Food in the Last Year: Never true  Transportation Needs: No Transportation Needs  . Lack of Transportation (Medical): No  . Lack of Transportation (Non-Medical): No  Physical Activity: Inactive  . Days of Exercise per Week: 0 days  . Minutes of Exercise per Session: 0 min  Stress: No Stress Concern Present  . Feeling of Stress : Only a little  Social  Connections: Socially Isolated  . Frequency of Communication with Friends and Family: Never  . Frequency of Social Gatherings with Friends and Family: Never  . Attends Religious Services: Never  . Active Member of Clubs or Organizations: No  . Attends Archivist Meetings: Never  . Marital Status: Married  Human resources officer Violence: Not At Risk  . Fear of Current or Ex-Partner: No  . Emotionally Abused: No  . Physically Abused: No  . Sexually Abused: No    Current Outpatient Medications on File Prior to Visit  Medication Sig Dispense Refill  . aspirin 325 MG tablet Take 1 tablet (325 mg total) by mouth every morning. 30 tablet 0  . DILT-XR 180 MG 24 hr capsule Take 1 capsule by mouth once daily 90 capsule 0  . docusate sodium (COLACE) 100 MG capsule Take 100 mg by mouth daily as needed for mild constipation or moderate constipation.     Marland Kitchen epoetin alfa (EPOGEN) 3000 UNIT/ML injection Inject 3,000 Units into the vein every 14 (fourteen) days.     Marland Kitchen ezetimibe (ZETIA) 10 MG tablet Take 1 tablet (10 mg total) by mouth daily. 90 tablet 3  . hydrALAZINE (APRESOLINE) 25 MG tablet TAKE 1 TABLET BY MOUTH TWICE DAILY AT  9  AM  AND  9  PM 180 tablet 0  . metolazone (ZAROXOLYN) 5 MG tablet Take 1 tablet by mouth once daily 90 tablet 0  . metoprolol tartrate (LOPRESSOR) 100 MG tablet Take 1 tablet by mouth twice daily (Patient taking differently: 50 mg 2 (two) times daily.) 180 tablet 0  . midodrine (PROAMATINE) 5 MG tablet Take 5 mg by mouth 3 (three) times daily.    . potassium chloride (KLOR-CON) 10 MEQ tablet Take 3 tablets (30 mEq total) by mouth 3 (three) times daily. 810 tablet 3  . pravastatin (PRAVACHOL) 20 MG tablet TAKE 1 TABLET BY MOUTH ONCE DAILY BEFORE BREAKFAST 90 tablet 1  . torsemide (DEMADEX) 20 MG tablet Take 2 tablets (40 mg total) by mouth 2 (two) times daily. 56 tablet 0   No current facility-administered medications on file prior to visit.    Allergies  Allergen  Reactions  . Benazepril Swelling  . Metronidazole Hives  . Mobic [Meloxicam] Hives  . Penicillins Hives and Swelling  . Sulfonamide Derivatives Hives    Family History  Problem Relation Age of Onset  . Hypertension Brother   . Gout Brother   . Prostate cancer Brother   . Hypertension Brother   . Prostate cancer Brother   . Gout Brother   . Hypertension Sister   . Gout Sister   . Leukemia Sister 36  . Pancreatic cancer Sister 28  .  Gout Sister   . Prostate cancer Brother   . Diabetes Neg Hx     BP (!) 150/80 (BP Location: Right Arm, Patient Position: Sitting, Cuff Size: Large)   Pulse 72   Ht 5\' 3"  (1.6 m)   Wt 186 lb (84.4 kg)   SpO2 96%   BMI 32.95 kg/m    Review of Systems She denies LOC    Objective:   Physical Exam VITAL SIGNS:  See vs page GENERAL: no distress Pulses: dorsalis pedis absent bilat (poss due to edema) MSK: no deformity of the feet CV: 2+ bilat leg edema.   Skin:  no ulcer on the feet.  normal color and temp on the feet.   Neuro: sensation is intact to touch on the feet, but decreased from normal.    Lab Results  Component Value Date   HGBA1C 7.1 (H) 03/17/2020        Assessment & Plan:  Insulin-requiring type 2 DM, with CRI Hypoglycemia, due to insulin: this limits aggressiveness of glycemic control   Patient Instructions  check your blood sugar twice a day.  vary the time of day when you check, between before the 3 meals, and at bedtime.  also check if you have symptoms of your blood sugar being too high or too low.  please keep a record of the readings and bring it to your next appointment here.  You can write it on any piece of paper.  please call us sooner if your blood sugar goes below 70, or if you have a lot of readings over 200.   Please reduce the insulin to 95 units with breakfast   On this type of insulin schedule, you should eat meals on a regular schedule.  If a meal is missed or significantly delayed, your blood sugar  could go low.   Please come back for a follow-up appointment in 3 months.

## 2020-03-22 NOTE — Patient Instructions (Addendum)
check your blood sugar twice a day.  vary the time of day when you check, between before the 3 meals, and at bedtime.  also check if you have symptoms of your blood sugar being too high or too low.  please keep a record of the readings and bring it to your next appointment here.  You can write it on any piece of paper.  please call us sooner if your blood sugar goes below 70, or if you have a lot of readings over 200.   Please reduce the insulin to 95 units with breakfast   On this type of insulin schedule, you should eat meals on a regular schedule.  If a meal is missed or significantly delayed, your blood sugar could go low.   Please come back for a follow-up appointment in 3 months.

## 2020-03-24 ENCOUNTER — Encounter (HOSPITAL_COMMUNITY): Payer: Self-pay

## 2020-03-24 ENCOUNTER — Encounter (HOSPITAL_COMMUNITY)
Admission: RE | Admit: 2020-03-24 | Discharge: 2020-03-24 | Disposition: A | Discharge status: Home / Self Care | Payer: HMO | Source: Ambulatory Visit | Attending: Nephrology | Admitting: Nephrology

## 2020-03-24 ENCOUNTER — Other Ambulatory Visit: Payer: Self-pay

## 2020-03-24 ENCOUNTER — Telehealth: Payer: Self-pay | Admitting: Orthopaedic Surgery

## 2020-03-24 DIAGNOSIS — N184 Chronic kidney disease, stage 4 (severe): Secondary | ICD-10-CM | POA: Diagnosis not present

## 2020-03-24 LAB — POCT HEMOGLOBIN-HEMACUE: Hemoglobin: 12.3 g/dL (ref 12.0–15.0)

## 2020-03-24 MED ORDER — EPOETIN ALFA 3000 UNIT/ML IJ SOLN: 3000.0000 [IU] | Freq: Once | INTRAMUSCULAR | Status: DC

## 2020-03-24 MED ORDER — HYDROCODONE-ACETAMINOPHEN 5-325 MG PO TABS: ORAL_TABLET | ORAL | 0 refills | Status: DC

## 2020-03-24 NOTE — Telephone Encounter (Signed)
Patient requests Hydrocodone/Acetaminophen 25-325 mgs.  Qty  28  Sig: One tablet every six hours for pain. Limit 7 days.  Patient states she uses Product/process development scientist

## 2020-03-25 DIAGNOSIS — I10 Essential (primary) hypertension: Secondary | ICD-10-CM | POA: Diagnosis not present

## 2020-03-25 DIAGNOSIS — Z01810 Encounter for preprocedural cardiovascular examination: Secondary | ICD-10-CM | POA: Diagnosis not present

## 2020-03-25 DIAGNOSIS — I517 Cardiomegaly: Secondary | ICD-10-CM | POA: Diagnosis not present

## 2020-03-25 DIAGNOSIS — H1820 Unspecified corneal edema: Secondary | ICD-10-CM | POA: Diagnosis not present

## 2020-03-25 DIAGNOSIS — H2512 Age-related nuclear cataract, left eye: Secondary | ICD-10-CM | POA: Diagnosis not present

## 2020-03-29 ENCOUNTER — Encounter: Payer: HMO | Admitting: Family Medicine

## 2020-03-31 DIAGNOSIS — Z8673 Personal history of transient ischemic attack (TIA), and cerebral infarction without residual deficits: Secondary | ICD-10-CM | POA: Diagnosis not present

## 2020-03-31 DIAGNOSIS — H1811 Bullous keratopathy, right eye: Secondary | ICD-10-CM | POA: Diagnosis not present

## 2020-03-31 DIAGNOSIS — I130 Hypertensive heart and chronic kidney disease with heart failure and stage 1 through stage 4 chronic kidney disease, or unspecified chronic kidney disease: Secondary | ICD-10-CM | POA: Diagnosis not present

## 2020-03-31 DIAGNOSIS — E669 Obesity, unspecified: Secondary | ICD-10-CM | POA: Diagnosis not present

## 2020-03-31 DIAGNOSIS — Z6832 Body mass index (BMI) 32.0-32.9, adult: Secondary | ICD-10-CM | POA: Diagnosis not present

## 2020-03-31 DIAGNOSIS — H2512 Age-related nuclear cataract, left eye: Secondary | ICD-10-CM | POA: Diagnosis not present

## 2020-03-31 DIAGNOSIS — H18511 Endothelial corneal dystrophy, right eye: Secondary | ICD-10-CM | POA: Diagnosis not present

## 2020-03-31 DIAGNOSIS — E1122 Type 2 diabetes mellitus with diabetic chronic kidney disease: Secondary | ICD-10-CM | POA: Diagnosis not present

## 2020-03-31 DIAGNOSIS — Z87891 Personal history of nicotine dependence: Secondary | ICD-10-CM | POA: Diagnosis not present

## 2020-03-31 DIAGNOSIS — I509 Heart failure, unspecified: Secondary | ICD-10-CM | POA: Diagnosis not present

## 2020-03-31 DIAGNOSIS — H16402 Unspecified corneal neovascularization, left eye: Secondary | ICD-10-CM | POA: Diagnosis not present

## 2020-03-31 DIAGNOSIS — H1812 Bullous keratopathy, left eye: Secondary | ICD-10-CM | POA: Diagnosis not present

## 2020-03-31 DIAGNOSIS — H18512 Endothelial corneal dystrophy, left eye: Secondary | ICD-10-CM | POA: Diagnosis not present

## 2020-03-31 DIAGNOSIS — Z794 Long term (current) use of insulin: Secondary | ICD-10-CM | POA: Diagnosis not present

## 2020-03-31 DIAGNOSIS — N184 Chronic kidney disease, stage 4 (severe): Secondary | ICD-10-CM | POA: Diagnosis not present

## 2020-03-31 DIAGNOSIS — E785 Hyperlipidemia, unspecified: Secondary | ICD-10-CM | POA: Diagnosis not present

## 2020-04-01 DIAGNOSIS — H2513 Age-related nuclear cataract, bilateral: Secondary | ICD-10-CM | POA: Diagnosis not present

## 2020-04-01 DIAGNOSIS — Z9889 Other specified postprocedural states: Secondary | ICD-10-CM | POA: Diagnosis not present

## 2020-04-01 DIAGNOSIS — Z48810 Encounter for surgical aftercare following surgery on the sense organs: Secondary | ICD-10-CM | POA: Diagnosis not present

## 2020-04-01 DIAGNOSIS — I10 Essential (primary) hypertension: Secondary | ICD-10-CM | POA: Diagnosis not present

## 2020-04-01 DIAGNOSIS — H35033 Hypertensive retinopathy, bilateral: Secondary | ICD-10-CM | POA: Diagnosis not present

## 2020-04-01 DIAGNOSIS — Z87891 Personal history of nicotine dependence: Secondary | ICD-10-CM | POA: Diagnosis not present

## 2020-04-01 DIAGNOSIS — H1820 Unspecified corneal edema: Secondary | ICD-10-CM | POA: Diagnosis not present

## 2020-04-01 DIAGNOSIS — E1136 Type 2 diabetes mellitus with diabetic cataract: Secondary | ICD-10-CM | POA: Diagnosis not present

## 2020-04-01 DIAGNOSIS — H20022 Recurrent acute iridocyclitis, left eye: Secondary | ICD-10-CM | POA: Diagnosis not present

## 2020-04-01 DIAGNOSIS — Z794 Long term (current) use of insulin: Secondary | ICD-10-CM | POA: Diagnosis not present

## 2020-04-05 ENCOUNTER — Ambulatory Visit: Payer: PPO | Admitting: Orthopaedic Surgery

## 2020-04-07 ENCOUNTER — Encounter (HOSPITAL_COMMUNITY)
Admission: RE | Admit: 2020-04-07 | Discharge: 2020-04-07 | Disposition: A | Discharge status: Home / Self Care | Payer: HMO | Source: Ambulatory Visit | Attending: Nephrology | Admitting: Nephrology

## 2020-04-07 ENCOUNTER — Other Ambulatory Visit: Payer: Self-pay

## 2020-04-07 ENCOUNTER — Encounter (HOSPITAL_COMMUNITY): Payer: Self-pay

## 2020-04-07 DIAGNOSIS — N184 Chronic kidney disease, stage 4 (severe): Secondary | ICD-10-CM | POA: Diagnosis not present

## 2020-04-07 DIAGNOSIS — D631 Anemia in chronic kidney disease: Secondary | ICD-10-CM | POA: Diagnosis not present

## 2020-04-07 LAB — POCT HEMOGLOBIN-HEMACUE: Hemoglobin: 12.5 g/dL (ref 12.0–15.0)

## 2020-04-07 MED ORDER — EPOETIN ALFA 3000 UNIT/ML IJ SOLN: 3000.0000 [IU] | Freq: Once | INTRAMUSCULAR | Status: DC

## 2020-04-12 ENCOUNTER — Other Ambulatory Visit: Payer: Self-pay

## 2020-04-12 ENCOUNTER — Ambulatory Visit: Payer: HMO | Admitting: Orthopaedic Surgery

## 2020-04-12 ENCOUNTER — Encounter: Payer: Self-pay | Admitting: Orthopaedic Surgery

## 2020-04-12 VITALS — BP 157/73 | HR 72 | Ht 63.0 in | Wt 186.0 lb

## 2020-04-12 DIAGNOSIS — M5442 Lumbago with sciatica, left side: Secondary | ICD-10-CM | POA: Diagnosis not present

## 2020-04-12 DIAGNOSIS — R2689 Other abnormalities of gait and mobility: Secondary | ICD-10-CM | POA: Diagnosis not present

## 2020-04-12 DIAGNOSIS — E0849 Diabetes mellitus due to underlying condition with other diabetic neurological complication: Secondary | ICD-10-CM | POA: Diagnosis not present

## 2020-04-12 DIAGNOSIS — G8929 Other chronic pain: Secondary | ICD-10-CM | POA: Diagnosis not present

## 2020-04-12 NOTE — Progress Notes (Signed)
Patient Krista Strickland, female DOB:[birth year], 70 y.o. BTD:[id number]  Chief Complaint  Patient presents with  . Back Pain    HPI  Krista Strickland is a 70 y.o. female who has chronic lower back pain, diabetes and left vision problems.  Her back has good and bad days.  She has no new trauma or weakness. She has A1C of 7.4.  Her vision of the left eye is worse.     Body mass index is 32.95 kg/m.  ROS  Review of Systems  Constitutional: Positive for activity change.  Musculoskeletal: Positive for arthralgias, back pain, gait problem and joint swelling.  Neurological: Tremors:  .wkl.  All other systems reviewed and are negative.   All other systems reviewed and are negative.  The following is a summary of the past history medically, past history surgically, known current medicines, social history and family history.  This information is gathered electronically by the computer from prior information and documentation.  I review this each visit and have found including this information at this point in the chart is beneficial and informative.    Past Medical History:  Diagnosis Date  . Anemia   . Arthritis   . CKD (chronic kidney disease) stage 3, GFR 30-59 ml/min (HCC)   . Diabetes mellitus, type 2 ([place])   . Essential hypertension   . GERD (gastroesophageal reflux disease)   . Gout   . History of MRSA infection 03/2009  . History of stroke 2013  . HOCM (hypertrophic obstructive cardiomyopathy) ([place])   . Hypercalcemia 2017   Managed by nephrology  . Hyperlipidemia   . Obesity   . Psoriasis   . Uterine cancer ([place])   . 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 ABDOMINAL HYSTERECTOMY    . APPENDECTOMY  2012  . COLONOSCOPY WITH PROPOFOL N/A 08/24/2016   Procedure: COLONOSCOPY WITH PROPOFOL;  Surgeon: Rogene Houston, MD;  [facility];  Service: Endoscopy;  Laterality: N/A;  10:30  . Excison of right  breast cyst    . LAPAROSCOPIC SALPINGO OOPHERECTOMY Right 04/22/2012   Procedure: LAPAROSCOPIC SALPINGO OOPHORECTOMY;  Surgeon: Jonnie Kind, MD;  Location: [facility];  Service: Gynecology;  Laterality: Right;  end 11:17  . MASS EXCISION N/A 04/22/2012   Procedure: EXCISION SKIN TAGS NECK AND HEAD;  Surgeon: Jonnie Kind, MD;  Location: [facility];  Service: Gynecology;  Laterality: N/A;  start 11:19  . PARTIAL HYSTERECTOMY    . POLYPECTOMY  08/24/2016   Procedure: POLYPECTOMY;  Surgeon: Rogene Houston, MD;  [facility];  Service: Endoscopy;;  colon  . Right neck biopsy      Family History  Problem Relation Age of Onset  . Hypertension Brother   . Gout Brother   . Prostate cancer Brother   . Hypertension Brother   . Prostate cancer Brother   . Gout Brother   . Hypertension Sister   . Gout Sister   . Leukemia Sister 87  . Pancreatic cancer Sister 1  . Gout Sister   . Prostate cancer Brother   . Diabetes Neg Hx     Social History Social History   Tobacco Use  . Smoking status: Former Smoker    Packs/day: 0.25    Years: 12.00    Pack years: 3.00    Quit date: 07/16/1978    Years since quitting: 41.7  . Smokeless tobacco: Never Used  Vaping  Use  . Vaping Use: Never used  Substance Use Topics  . Alcohol use: No  . Drug use: No    Allergies  Allergen Reactions  . Benazepril Swelling  . Metronidazole Hives  . Mobic [Meloxicam] Hives  . Penicillins Hives and Swelling  . Sulfonamide Derivatives Hives    Current Outpatient Medications  Medication Sig Dispense Refill  . aspirin 325 MG tablet Take 1 tablet (325 mg total) by mouth every morning. 30 tablet 0  . DILT-XR 180 MG 24 hr capsule Take 1 capsule by mouth once daily 90 capsule 0  . docusate sodium (COLACE) 100 MG capsule Take 100 mg by mouth daily as needed for mild constipation or moderate constipation.     Marland Kitchen epoetin alfa (EPOGEN) 3000 UNIT/ML injection Inject 3,000 Units into the vein every 14  (fourteen) days.     Marland Kitchen ezetimibe (ZETIA) 10 MG tablet Take 1 tablet (10 mg total) by mouth daily. 90 tablet 3  . glucose blood (ONETOUCH ULTRA) test strip 1 each by Other route 2 (two) times daily. And lancets 2/day 200 each 3  . hydrALAZINE (APRESOLINE) 25 MG tablet TAKE 1 TABLET BY MOUTH TWICE DAILY AT  9  AM  AND  9  PM 180 tablet 0  . HYDROcodone-acetaminophen (NORCO/VICODIN) 5-325 MG tablet One tablet every six hours for pain.  Limit 7 days. 28 tablet 0  . insulin NPH-regular Human (NOVOLIN 70/30 RELION) (70-30) 100 UNIT/ML injection Inject 95 Units into the skin daily with breakfast. 90 mL 3  . metolazone (ZAROXOLYN) 5 MG tablet Take 1 tablet by mouth once daily 90 tablet 0  . metoprolol tartrate (LOPRESSOR) 100 MG tablet Take 1 tablet by mouth twice daily (Patient taking differently: 50 mg 2 (two) times daily.) 180 tablet 0  . potassium chloride (KLOR-CON) 10 MEQ tablet Take 3 tablets (30 mEq total) by mouth 3 (three) times daily. 810 tablet 3  . pravastatin (PRAVACHOL) 20 MG tablet TAKE 1 TABLET BY MOUTH ONCE DAILY BEFORE BREAKFAST 90 tablet 1  . torsemide (DEMADEX) 20 MG tablet Take 2 tablets (40 mg total) by mouth 2 (two) times daily. 56 tablet 0   No current facility-administered medications for this visit.     Physical Exam  Blood pressure (!) 157/73, pulse 72, height 5\' 3"  (1.6 m), weight 186 lb (84.4 kg).  Constitutional: overall normal hygiene, normal nutrition, well developed, normal grooming, normal body habitus. Assistive device:none  Musculoskeletal: gait and station Limp none, muscle tone and strength are normal, no tremors or atrophy is present.  .  Neurological: coordination overall normal.  Deep tendon reflex/nerve stretch intact.  Sensation normal.  Cranial nerves II-XII intact.   Skin:   Normal overall no scars, lesions, ulcers or rashes. No psoriasis.  Psychiatric: Alert and oriented x 3.  Recent memory intact, remote memory unclear.  Normal mood and affect.  Well groomed.  Good eye contact.  Cardiovascular: overall no swelling, no varicosities, no edema bilaterally, normal temperatures of the legs and arms, no clubbing, cyanosis and good capillary refill.  Lymphatic: palpation is normal.  Spine/Pelvis examination:  Inspection:  Overall, sacoiliac joint benign and hips nontender; without crepitus or defects.   Thoracic spine inspection: Alignment normal without kyphosis present   Lumbar spine inspection:  Alignment  with normal lumbar lordosis, without scoliosis apparent.   Thoracic spine palpation:  without tenderness of spinal processes   Lumbar spine palpation: without tenderness of lumbar area; without tightness of lumbar muscles    Range  of Motion:   Lumbar flexion, forward flexion is normal without pain or tenderness    Lumbar extension is full without pain or tenderness   Left lateral bend is normal without pain or tenderness   Right lateral bend is normal without pain or tenderness   Straight leg raising is normal  Strength & tone: normal   Stability overall normal stability  All other systems reviewed and are negative   The patient has been educated about the nature of the problem(s) and counseled on treatment options.  The patient appeared to understand what I have discussed and is in agreement with it.  Encounter Diagnoses  Name Primary?  . Chronic left-sided low back pain with left-sided sciatica Yes  . Balance problem   . 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  Precautions discussed.  Continue current medications.   Return to clinic 2 months   Electronically Signed Sanjuana Kava, MD 3/15/20223:57 PM

## 2020-04-14 ENCOUNTER — Other Ambulatory Visit (HOSPITAL_COMMUNITY): Payer: Self-pay | Admitting: Family Medicine

## 2020-04-14 DIAGNOSIS — Z1231 Encounter for screening mammogram for malignant neoplasm of breast: Secondary | ICD-10-CM

## 2020-04-19 DIAGNOSIS — H35033 Hypertensive retinopathy, bilateral: Secondary | ICD-10-CM | POA: Diagnosis not present

## 2020-04-19 DIAGNOSIS — E119 Type 2 diabetes mellitus without complications: Secondary | ICD-10-CM | POA: Diagnosis not present

## 2020-04-19 DIAGNOSIS — H1820 Unspecified corneal edema: Secondary | ICD-10-CM | POA: Diagnosis not present

## 2020-04-19 DIAGNOSIS — H2513 Age-related nuclear cataract, bilateral: Secondary | ICD-10-CM | POA: Diagnosis not present

## 2020-04-19 DIAGNOSIS — H20022 Recurrent acute iridocyclitis, left eye: Secondary | ICD-10-CM | POA: Diagnosis not present

## 2020-04-20 ENCOUNTER — Ambulatory Visit (HOSPITAL_COMMUNITY)
Admission: RE | Admit: 2020-04-20 | Discharge: 2020-04-20 | Disposition: A | Discharge status: Home / Self Care | Payer: HMO | Source: Ambulatory Visit | Attending: Family Medicine | Admitting: Family Medicine

## 2020-04-20 ENCOUNTER — Other Ambulatory Visit: Payer: Self-pay

## 2020-04-20 DIAGNOSIS — Z1231 Encounter for screening mammogram for malignant neoplasm of breast: Secondary | ICD-10-CM | POA: Diagnosis not present

## 2020-04-21 ENCOUNTER — Encounter (HOSPITAL_COMMUNITY): Payer: Self-pay

## 2020-04-21 ENCOUNTER — Encounter (HOSPITAL_COMMUNITY)
Admission: RE | Admit: 2020-04-21 | Discharge: 2020-04-21 | Disposition: A | Discharge status: Home / Self Care | Payer: HMO | Source: Ambulatory Visit | Attending: Nephrology | Admitting: Nephrology

## 2020-04-21 DIAGNOSIS — N184 Chronic kidney disease, stage 4 (severe): Secondary | ICD-10-CM | POA: Diagnosis not present

## 2020-04-21 LAB — RENAL FUNCTION PANEL
Albumin: 3.7 g/dL (ref 3.5–5.0)
Anion gap: 14 (ref 5–15)
BUN: 85 mg/dL — ABNORMAL HIGH (ref 8–23)
CO2: 26 mmol/L (ref 22–32)
Calcium: 10.2 mg/dL (ref 8.9–10.3)
Chloride: 93 mmol/L — ABNORMAL LOW (ref 98–111)
Creatinine, Ser: 2.77 mg/dL — ABNORMAL HIGH (ref 0.44–1.00)
GFR, Estimated: 18 mL/min — ABNORMAL LOW (ref >60)
Glucose, Bld: 153 mg/dL — ABNORMAL HIGH (ref 70–99)
Phosphorus: 3.5 mg/dL (ref 2.5–4.6)
Potassium: 4 mmol/L (ref 3.5–5.1)
Sodium: 133 mmol/L — ABNORMAL LOW (ref 135–145)

## 2020-04-21 LAB — CBC
HCT: 39.1 % (ref 36.0–46.0)
Hemoglobin: 12.6 g/dL (ref 12.0–15.0)
MCH: 28.1 pg (ref 26.0–34.0)
MCHC: 32.2 g/dL (ref 30.0–36.0)
MCV: 87.3 fL (ref 80.0–100.0)
Platelets: 275 10*3/uL (ref 150–400)
RBC: 4.48 MIL/uL (ref 3.87–5.11)
RDW: 14.3 % (ref 11.5–15.5)
WBC: 10.2 10*3/uL (ref 4.0–10.5)
nRBC: 0 % (ref 0.0–0.2)

## 2020-04-21 LAB — PROTEIN / CREATININE RATIO, URINE
Creatinine, Urine: 35.19 mg/dL
Protein Creatinine Ratio: 2.02 mg/mg{Cre} — ABNORMAL HIGH (ref 0.00–0.15)
Total Protein, Urine: 71 mg/dL

## 2020-04-21 LAB — POCT HEMOGLOBIN-HEMACUE: Hemoglobin: 12.4 g/dL (ref 12.0–15.0)

## 2020-04-21 MED ORDER — EPOETIN ALFA 3000 UNIT/ML IJ SOLN: 3000.0000 [IU] | Freq: Once | INTRAMUSCULAR | Status: DC

## 2020-04-22 DIAGNOSIS — N170 Acute kidney failure with tubular necrosis: Secondary | ICD-10-CM | POA: Diagnosis not present

## 2020-04-22 DIAGNOSIS — N189 Chronic kidney disease, unspecified: Secondary | ICD-10-CM | POA: Diagnosis not present

## 2020-04-22 DIAGNOSIS — R809 Proteinuria, unspecified: Secondary | ICD-10-CM | POA: Diagnosis not present

## 2020-04-22 DIAGNOSIS — E1122 Type 2 diabetes mellitus with diabetic chronic kidney disease: Secondary | ICD-10-CM | POA: Diagnosis not present

## 2020-04-22 DIAGNOSIS — I129 Hypertensive chronic kidney disease with stage 1 through stage 4 chronic kidney disease, or unspecified chronic kidney disease: Secondary | ICD-10-CM | POA: Diagnosis not present

## 2020-04-22 DIAGNOSIS — E1129 Type 2 diabetes mellitus with other diabetic kidney complication: Secondary | ICD-10-CM | POA: Diagnosis not present

## 2020-04-22 DIAGNOSIS — E871 Hypo-osmolality and hyponatremia: Secondary | ICD-10-CM | POA: Diagnosis not present

## 2020-04-28 ENCOUNTER — Telehealth: Payer: Self-pay

## 2020-04-28 NOTE — Telephone Encounter (Signed)
Manuela Schwartz the nurse from [facility] called trying to get in touch with patient numerous times concerning her health issues trying to see if she could assist in anyway.  Manuela Schwartz # [phone]

## 2020-04-29 ENCOUNTER — Telehealth: Payer: Self-pay | Admitting: Endocrinology

## 2020-04-29 NOTE — Telephone Encounter (Signed)
Pt's case worker with [employer], Manuela Schwartz called, she has been trying to get ahold of the pt with no luck. She wanted to reach out to [place] to let us know if Dr.Ellison or the pt herself needs help with managing her kidney function since it has gone downhill, she is here to help.   She says please don't hesitate to reach out to her at ph# [phone]

## 2020-05-02 NOTE — Telephone Encounter (Signed)
Please Advise

## 2020-05-03 NOTE — Telephone Encounter (Signed)
LVM for Manuela Schwartz B for her to contact the pts PCP in regards to her message.

## 2020-05-03 NOTE — Telephone Encounter (Signed)
Please direct this message to primary care provider

## 2020-05-05 ENCOUNTER — Other Ambulatory Visit: Payer: Self-pay

## 2020-05-05 ENCOUNTER — Encounter (HOSPITAL_COMMUNITY): Payer: Self-pay

## 2020-05-05 ENCOUNTER — Encounter (HOSPITAL_COMMUNITY)
Admission: RE | Admit: 2020-05-05 | Discharge: 2020-05-05 | Disposition: A | Discharge status: Home / Self Care | Payer: HMO | Source: Ambulatory Visit | Attending: Nephrology | Admitting: Nephrology

## 2020-05-05 DIAGNOSIS — D631 Anemia in chronic kidney disease: Secondary | ICD-10-CM | POA: Diagnosis not present

## 2020-05-05 DIAGNOSIS — N184 Chronic kidney disease, stage 4 (severe): Secondary | ICD-10-CM | POA: Diagnosis not present

## 2020-05-05 LAB — IRON AND TIBC
Iron: 80 ug/dL (ref 28–170)
Saturation Ratios: 24 % (ref 10.4–31.8)
TIBC: 327 ug/dL (ref 250–450)
UIBC: 247 ug/dL

## 2020-05-05 LAB — RENAL FUNCTION PANEL
Albumin: 4 g/dL (ref 3.5–5.0)
Anion gap: 14 (ref 5–15)
BUN: 82 mg/dL — ABNORMAL HIGH (ref 8–23)
CO2: 24 mmol/L (ref 22–32)
Calcium: 10.3 mg/dL (ref 8.9–10.3)
Chloride: 94 mmol/L — ABNORMAL LOW (ref 98–111)
Creatinine, Ser: 2.68 mg/dL — ABNORMAL HIGH (ref 0.44–1.00)
GFR, Estimated: 19 mL/min — ABNORMAL LOW (ref >60)
Glucose, Bld: 279 mg/dL — ABNORMAL HIGH (ref 70–99)
Phosphorus: 3.2 mg/dL (ref 2.5–4.6)
Potassium: 4 mmol/L (ref 3.5–5.1)
Sodium: 132 mmol/L — ABNORMAL LOW (ref 135–145)

## 2020-05-05 LAB — CBC
HCT: 38.5 % (ref 36.0–46.0)
Hemoglobin: 12.7 g/dL (ref 12.0–15.0)
MCH: 28.7 pg (ref 26.0–34.0)
MCHC: 33 g/dL (ref 30.0–36.0)
MCV: 87.1 fL (ref 80.0–100.0)
Platelets: 212 10*3/uL (ref 150–400)
RBC: 4.42 MIL/uL (ref 3.87–5.11)
RDW: 14.9 % (ref 11.5–15.5)
WBC: 10.2 10*3/uL (ref 4.0–10.5)
nRBC: 0 % (ref 0.0–0.2)

## 2020-05-05 LAB — PROTEIN / CREATININE RATIO, URINE
Creatinine, Urine: 80.02 mg/dL
Protein Creatinine Ratio: 2.36 mg/mg{Cre} — ABNORMAL HIGH (ref 0.00–0.15)
Total Protein, Urine: 189 mg/dL

## 2020-05-05 MED ORDER — EPOETIN ALFA 3000 UNIT/ML IJ SOLN: 3000.0000 [IU] | Freq: Once | INTRAMUSCULAR | Status: DC

## 2020-05-06 LAB — PARATHYROID HORMONE, INTACT (NO CA): PTH: 139 pg/mL — ABNORMAL HIGH (ref 15–65)

## 2020-05-10 ENCOUNTER — Other Ambulatory Visit: Payer: Self-pay | Admitting: Family Medicine

## 2020-05-12 LAB — POCT HEMOGLOBIN-HEMACUE: Hemoglobin: 12.3 g/dL (ref 12.0–15.0)

## 2020-05-12 NOTE — Telephone Encounter (Signed)
Pt made aware via my chart

## 2020-05-16 NOTE — Telephone Encounter (Signed)
Noted. (disregard msg below. Pt doesn't have mychart)

## 2020-05-17 ENCOUNTER — Other Ambulatory Visit: Payer: Self-pay | Admitting: Family Medicine

## 2020-05-19 ENCOUNTER — Other Ambulatory Visit: Payer: Self-pay

## 2020-05-19 ENCOUNTER — Encounter (HOSPITAL_COMMUNITY)
Admission: RE | Admit: 2020-05-19 | Discharge: 2020-05-19 | Disposition: A | Discharge status: Home / Self Care | Payer: HMO | Source: Ambulatory Visit | Attending: Nephrology | Admitting: Nephrology

## 2020-05-19 DIAGNOSIS — N184 Chronic kidney disease, stage 4 (severe): Secondary | ICD-10-CM | POA: Diagnosis not present

## 2020-05-19 LAB — POCT HEMOGLOBIN-HEMACUE: Hemoglobin: 12.2 g/dL (ref 12.0–15.0)

## 2020-05-25 DIAGNOSIS — N189 Chronic kidney disease, unspecified: Secondary | ICD-10-CM | POA: Diagnosis not present

## 2020-05-25 DIAGNOSIS — E1122 Type 2 diabetes mellitus with diabetic chronic kidney disease: Secondary | ICD-10-CM | POA: Diagnosis not present

## 2020-05-25 DIAGNOSIS — E871 Hypo-osmolality and hyponatremia: Secondary | ICD-10-CM | POA: Diagnosis not present

## 2020-05-25 DIAGNOSIS — I129 Hypertensive chronic kidney disease with stage 1 through stage 4 chronic kidney disease, or unspecified chronic kidney disease: Secondary | ICD-10-CM | POA: Diagnosis not present

## 2020-05-25 DIAGNOSIS — N170 Acute kidney failure with tubular necrosis: Secondary | ICD-10-CM | POA: Diagnosis not present

## 2020-05-25 DIAGNOSIS — D631 Anemia in chronic kidney disease: Secondary | ICD-10-CM | POA: Diagnosis not present

## 2020-05-25 DIAGNOSIS — E1129 Type 2 diabetes mellitus with other diabetic kidney complication: Secondary | ICD-10-CM | POA: Diagnosis not present

## 2020-05-25 DIAGNOSIS — R809 Proteinuria, unspecified: Secondary | ICD-10-CM | POA: Diagnosis not present

## 2020-05-25 DIAGNOSIS — E211 Secondary hyperparathyroidism, not elsewhere classified: Secondary | ICD-10-CM | POA: Diagnosis not present

## 2020-05-30 DIAGNOSIS — M79674 Pain in right toe(s): Secondary | ICD-10-CM | POA: Diagnosis not present

## 2020-05-30 DIAGNOSIS — M79675 Pain in left toe(s): Secondary | ICD-10-CM | POA: Diagnosis not present

## 2020-05-30 DIAGNOSIS — E1151 Type 2 diabetes mellitus with diabetic peripheral angiopathy without gangrene: Secondary | ICD-10-CM | POA: Diagnosis not present

## 2020-05-30 DIAGNOSIS — B351 Tinea unguium: Secondary | ICD-10-CM | POA: Diagnosis not present

## 2020-06-02 ENCOUNTER — Encounter (HOSPITAL_COMMUNITY): Payer: Self-pay

## 2020-06-02 ENCOUNTER — Encounter (HOSPITAL_COMMUNITY)
Admission: RE | Admit: 2020-06-02 | Discharge: 2020-06-02 | Disposition: A | Discharge status: Home / Self Care | Payer: HMO | Source: Ambulatory Visit | Attending: Nephrology | Admitting: Nephrology

## 2020-06-02 ENCOUNTER — Other Ambulatory Visit: Payer: Self-pay

## 2020-06-02 DIAGNOSIS — N184 Chronic kidney disease, stage 4 (severe): Secondary | ICD-10-CM | POA: Diagnosis not present

## 2020-06-02 DIAGNOSIS — D631 Anemia in chronic kidney disease: Secondary | ICD-10-CM | POA: Insufficient documentation

## 2020-06-02 LAB — RENAL FUNCTION PANEL
Albumin: 3.7 g/dL (ref 3.5–5.0)
Anion gap: 12 (ref 5–15)
BUN: 89 mg/dL — ABNORMAL HIGH (ref 8–23)
CO2: 28 mmol/L (ref 22–32)
Calcium: 9.8 mg/dL (ref 8.9–10.3)
Chloride: 91 mmol/L — ABNORMAL LOW (ref 98–111)
Creatinine, Ser: 2.7 mg/dL — ABNORMAL HIGH (ref 0.44–1.00)
GFR, Estimated: 18 mL/min — ABNORMAL LOW (ref >60)
Glucose, Bld: 251 mg/dL — ABNORMAL HIGH (ref 70–99)
Phosphorus: 4.2 mg/dL (ref 2.5–4.6)
Potassium: 3.3 mmol/L — ABNORMAL LOW (ref 3.5–5.1)
Sodium: 131 mmol/L — ABNORMAL LOW (ref 135–145)

## 2020-06-02 LAB — CBC WITH DIFFERENTIAL/PLATELET
Abs Immature Granulocytes: 0.05 10*3/uL (ref 0.00–0.07)
Basophils Absolute: 0.1 10*3/uL (ref 0.0–0.1)
Basophils Relative: 1 %
Eosinophils Absolute: 0.1 10*3/uL (ref 0.0–0.5)
Eosinophils Relative: 1 %
HCT: 36.9 % (ref 36.0–46.0)
Hemoglobin: 12 g/dL (ref 12.0–15.0)
Immature Granulocytes: 1 %
Lymphocytes Relative: 19 %
Lymphs Abs: 1.8 10*3/uL (ref 0.7–4.0)
MCH: 28.5 pg (ref 26.0–34.0)
MCHC: 32.5 g/dL (ref 30.0–36.0)
MCV: 87.6 fL (ref 80.0–100.0)
Monocytes Absolute: 1.1 10*3/uL — ABNORMAL HIGH (ref 0.1–1.0)
Monocytes Relative: 12 %
Neutro Abs: 6.2 10*3/uL (ref 1.7–7.7)
Neutrophils Relative %: 66 %
Platelets: 225 10*3/uL (ref 150–400)
RBC: 4.21 MIL/uL (ref 3.87–5.11)
RDW: 16.3 % — ABNORMAL HIGH (ref 11.5–15.5)
WBC: 9.2 10*3/uL (ref 4.0–10.5)
nRBC: 0 % (ref 0.0–0.2)

## 2020-06-02 LAB — IRON AND TIBC
Iron: 56 ug/dL (ref 28–170)
Saturation Ratios: 18 % (ref 10.4–31.8)
TIBC: 314 ug/dL (ref 250–450)
UIBC: 258 ug/dL

## 2020-06-02 LAB — POCT HEMOGLOBIN-HEMACUE: Hemoglobin: 12.4 g/dL (ref 12.0–15.0)

## 2020-06-02 MED ORDER — EPOETIN ALFA 3000 UNIT/ML IJ SOLN: 3000.0000 [IU] | Freq: Once | INTRAMUSCULAR | Status: DC

## 2020-06-03 LAB — PTH, INTACT AND CALCIUM
Calcium, Total (PTH): 9.8 mg/dL (ref 8.7–10.3)
PTH: 151 pg/mL — ABNORMAL HIGH (ref 15–65)

## 2020-06-14 ENCOUNTER — Other Ambulatory Visit: Payer: Self-pay

## 2020-06-14 ENCOUNTER — Encounter: Payer: Self-pay | Admitting: Orthopaedic Surgery

## 2020-06-14 ENCOUNTER — Ambulatory Visit: Payer: HMO | Admitting: Orthopaedic Surgery

## 2020-06-14 VITALS — BP 174/86 | HR 78 | Ht 63.0 in | Wt 185.4 lb

## 2020-06-14 DIAGNOSIS — E0849 Diabetes mellitus due to underlying condition with other diabetic neurological complication: Secondary | ICD-10-CM

## 2020-06-14 DIAGNOSIS — M1A0790 Idiopathic chronic gout, unspecified ankle and foot, without tophus (tophi): Secondary | ICD-10-CM | POA: Diagnosis not present

## 2020-06-14 MED ORDER — HYDROCODONE-ACETAMINOPHEN 5-325 MG PO TABS: ORAL_TABLET | ORAL | 0 refills | Status: DC

## 2020-06-14 MED ORDER — CYCLOBENZAPRINE HCL 10 MG PO TABS: 10.0000 mg | ORAL_TABLET | Freq: Every day | ORAL | 0 refills | Status: DC

## 2020-06-14 MED ORDER — FEBUXOSTAT 40 MG PO TABS: ORAL_TABLET | ORAL | 5 refills | Status: AC

## 2020-06-14 NOTE — Progress Notes (Signed)
Patient Krista Strickland, female DOB:07-08-1950, 70 y.o. ZTI:[id number]  Chief Complaint  Patient presents with  . Back Pain    Still hurting, feels like its getting worse. I get muscle spasms and it makes it hard for me to walk.    HPI  Krista Strickland is a 70 y.o. female who has gout. She has been back on the allopurinol which helps but she has kidney disease.  I will switch to uloric assuming [employer] will approve.  She has muscle spasm of the lower back and upper posterior thigh.  I will give Flexeril.  She has no new trauma.  She needs her pain medicine refilled.   Body mass index is 32.84 kg/m.  ROS  Review of Systems  Constitutional: Positive for activity change.  Musculoskeletal: Positive for arthralgias, back pain, gait problem and joint swelling.  Neurological: Tremors:  .wkl.  All other systems reviewed and are negative.   All other systems reviewed and are negative.  The following is a summary of the past history medically, past history surgically, known current medicines, social history and family history.  This information is gathered electronically by the computer from prior information and documentation.  I review this each visit and have found including this information at this point in the chart is beneficial and informative.    Past Medical History:  Diagnosis Date  . Anemia   . Arthritis   . CKD (chronic kidney disease) stage 3, GFR 30-59 ml/min (HCC)   . Diabetes mellitus, type 2 ([place])   . Essential hypertension   . GERD (gastroesophageal reflux disease)   . Gout   . History of MRSA infection 03/2009  . History of stroke 2013  . HOCM (hypertrophic obstructive cardiomyopathy) ([place])   . Hypercalcemia 2017   Managed by nephrology  . Hyperlipidemia   . Obesity   . Psoriasis   . Uterine cancer ([place])   . 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 ABDOMINAL HYSTERECTOMY    .  APPENDECTOMY  2012  . COLONOSCOPY WITH PROPOFOL N/A 08/24/2016   Procedure: COLONOSCOPY WITH PROPOFOL;  Surgeon: Rogene Houston, MD;  [facility];  Service: Endoscopy;  Laterality: N/A;  10:30  . Excison of right breast cyst    . LAPAROSCOPIC SALPINGO OOPHERECTOMY Right 04/22/2012   Procedure: LAPAROSCOPIC SALPINGO OOPHORECTOMY;  Surgeon: Jonnie Kind, MD;  Location: [facility];  Service: Gynecology;  Laterality: Right;  end 11:17  . MASS EXCISION N/A 04/22/2012   Procedure: EXCISION SKIN TAGS NECK AND HEAD;  Surgeon: Jonnie Kind, MD;  Location: [facility];  Service: Gynecology;  Laterality: N/A;  start 11:19  . PARTIAL HYSTERECTOMY    . POLYPECTOMY  08/24/2016   Procedure: POLYPECTOMY;  Surgeon: Rogene Houston, MD;  [facility];  Service: Endoscopy;;  colon  . Right neck biopsy      Family History  Problem Relation Age of Onset  . Hypertension Brother   . Gout Brother   . Prostate cancer Brother   . Hypertension Brother   . Prostate cancer Brother   . Gout Brother   . Hypertension Sister   . Gout Sister   . Leukemia Sister 3  . Pancreatic cancer Sister 65  . Gout Sister   . Prostate cancer Brother   . Diabetes Neg Hx     Social History Social History   Tobacco Use  . Smoking  status: Former Smoker    Packs/day: 0.25    Years: 12.00    Pack years: 3.00    Quit date: 07/16/1978    Years since quitting: 41.9  . Smokeless tobacco: Never Used  Vaping Use  . Vaping Use: Never used  Substance Use Topics  . Alcohol use: No  . Drug use: No    Allergies  Allergen Reactions  . Benazepril Swelling  . Metronidazole Hives  . Mobic [Meloxicam] Hives  . Penicillins Hives and Swelling  . Sulfonamide Derivatives Hives    Current Outpatient Medications  Medication Sig Dispense Refill  . allopurinol (ZYLOPRIM) 300 MG tablet Take 300 mg by mouth daily.    . cyclobenzaprine (FLEXERIL) 10 MG tablet Take 1 tablet (10 mg total) by mouth at bedtime. One  tablet every night at bedtime as needed for spasm. 30 tablet 0  . febuxostat (ULORIC) 40 MG tablet One daily for gout 30 tablet 5  . aspirin 325 MG tablet Take 1 tablet (325 mg total) by mouth every morning. 30 tablet 0  . DILT-XR 180 MG 24 hr capsule Take 1 capsule by mouth once daily 90 capsule 0  . docusate sodium (COLACE) 100 MG capsule Take 100 mg by mouth daily as needed for mild constipation or moderate constipation.     Marland Kitchen epoetin alfa (EPOGEN) 3000 UNIT/ML injection Inject 3,000 Units into the vein every 14 (fourteen) days.     Marland Kitchen ezetimibe (ZETIA) 10 MG tablet Take 1 tablet (10 mg total) by mouth daily. 90 tablet 3  . glucose blood (ONETOUCH ULTRA) test strip 1 each by Other route 2 (two) times daily. And lancets 2/day 200 each 3  . hydrALAZINE (APRESOLINE) 25 MG tablet TAKE 1 TABLET BY MOUTH TWICE DAILY- ONCE AT 9AM AND ONCE AT 9PM 180 tablet 0  . HYDROcodone-acetaminophen (NORCO/VICODIN) 5-325 MG tablet One tablet every six hours for pain.  Limit 7 days. 28 tablet 0  . insulin NPH-regular Human (NOVOLIN 70/30 RELION) (70-30) 100 UNIT/ML injection Inject 95 Units into the skin daily with breakfast. 90 mL 3  . metolazone (ZAROXOLYN) 5 MG tablet Take 1 tablet by mouth once daily 90 tablet 0  . metoprolol tartrate (LOPRESSOR) 100 MG tablet Take 1 tablet by mouth twice daily 180 tablet 0  . potassium chloride (KLOR-CON) 10 MEQ tablet Take 3 tablets (30 mEq total) by mouth 3 (three) times daily. 810 tablet 3  . pravastatin (PRAVACHOL) 20 MG tablet TAKE 1 TABLET BY MOUTH ONCE DAILY BEFORE BREAKFAST 90 tablet 0  . torsemide (DEMADEX) 20 MG tablet Take 2 tablets (40 mg total) by mouth 2 (two) times daily. 56 tablet 0   No current facility-administered medications for this visit.     Physical Exam  Blood pressure (!) 174/86, pulse 78, height 5\' 3"  (1.6 m), weight 185 lb 6 oz (84.1 kg).  Constitutional: overall normal hygiene, normal nutrition, well developed, normal grooming, normal body  habitus. Assistive device:none  Musculoskeletal: gait and station Limp none, muscle tone and strength are normal, no tremors or atrophy is present.  .  Neurological: coordination overall normal.  Deep tendon reflex/nerve stretch intact.  Sensation normal.  Cranial nerves II-XII intact.   Skin:   Normal overall no scars, lesions, ulcers or rashes. No psoriasis.  Psychiatric: Alert and oriented x 3.  Recent memory intact, remote memory unclear.  Normal mood and affect. Well groomed.  Good eye contact.  Cardiovascular: overall no swelling, no varicosities, no edema bilaterally, normal temperatures of  the legs and arms, no clubbing, cyanosis and good capillary refill.  Lymphatic: palpation is normal.  All other systems reviewed and are negative   The patient has been educated about the nature of the problem(s) and counseled on treatment options.  The patient appeared to understand what I have discussed and is in agreement with it.  Encounter Diagnoses  Name Primary?  . Chronic idiopathic gout involving toe without tophus, unspecified laterality Yes  . 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  Precautions discussed.  Continue current medications.   Return to clinic 3 months   I have reviewed the [facility] web site prior to prescribing narcotic medicine for this patient.   Electronically Signed Sanjuana Kava, MD 5/17/20222:48 PM

## 2020-06-16 ENCOUNTER — Other Ambulatory Visit: Payer: Self-pay

## 2020-06-16 ENCOUNTER — Encounter (HOSPITAL_COMMUNITY): Payer: Self-pay

## 2020-06-16 ENCOUNTER — Encounter (HOSPITAL_COMMUNITY)
Admission: RE | Admit: 2020-06-16 | Discharge: 2020-06-16 | Disposition: A | Discharge status: Home / Self Care | Payer: HMO | Source: Ambulatory Visit | Attending: Nephrology | Admitting: Nephrology

## 2020-06-16 DIAGNOSIS — N184 Chronic kidney disease, stage 4 (severe): Secondary | ICD-10-CM | POA: Diagnosis not present

## 2020-06-16 LAB — POCT HEMOGLOBIN-HEMACUE: Hemoglobin: 9.9 g/dL — ABNORMAL LOW (ref 12.0–15.0)

## 2020-06-16 MED ORDER — EPOETIN ALFA 3000 UNIT/ML IJ SOLN
3000.0000 [IU] | Freq: Once | INTRAMUSCULAR | Status: AC
  Administered 2020-06-16: 3000 [IU] via SUBCUTANEOUS

## 2020-06-16 MED ORDER — EPOETIN ALFA 3000 UNIT/ML IJ SOLN
INTRAMUSCULAR | Status: AC
  Filled 2020-06-16: qty 1

## 2020-06-20 ENCOUNTER — Other Ambulatory Visit: Payer: Self-pay | Admitting: Endocrinology

## 2020-06-23 ENCOUNTER — Ambulatory Visit: Payer: HMO | Admitting: Endocrinology

## 2020-06-23 DIAGNOSIS — E1122 Type 2 diabetes mellitus with diabetic chronic kidney disease: Secondary | ICD-10-CM | POA: Diagnosis not present

## 2020-06-23 DIAGNOSIS — N170 Acute kidney failure with tubular necrosis: Secondary | ICD-10-CM | POA: Diagnosis not present

## 2020-06-23 DIAGNOSIS — R809 Proteinuria, unspecified: Secondary | ICD-10-CM | POA: Diagnosis not present

## 2020-06-23 DIAGNOSIS — E876 Hypokalemia: Secondary | ICD-10-CM | POA: Diagnosis not present

## 2020-06-23 DIAGNOSIS — E1129 Type 2 diabetes mellitus with other diabetic kidney complication: Secondary | ICD-10-CM | POA: Diagnosis not present

## 2020-06-23 DIAGNOSIS — I129 Hypertensive chronic kidney disease with stage 1 through stage 4 chronic kidney disease, or unspecified chronic kidney disease: Secondary | ICD-10-CM | POA: Diagnosis not present

## 2020-06-23 DIAGNOSIS — E871 Hypo-osmolality and hyponatremia: Secondary | ICD-10-CM | POA: Diagnosis not present

## 2020-06-23 DIAGNOSIS — E211 Secondary hyperparathyroidism, not elsewhere classified: Secondary | ICD-10-CM | POA: Diagnosis not present

## 2020-06-23 DIAGNOSIS — N189 Chronic kidney disease, unspecified: Secondary | ICD-10-CM | POA: Diagnosis not present

## 2020-06-29 ENCOUNTER — Ambulatory Visit (INDEPENDENT_AMBULATORY_CARE_PROVIDER_SITE_OTHER): Payer: HMO | Admitting: Endocrinology

## 2020-06-29 ENCOUNTER — Other Ambulatory Visit: Payer: Self-pay

## 2020-06-29 VITALS — BP 176/86 | HR 77 | Ht 63.0 in | Wt 187.6 lb

## 2020-06-29 DIAGNOSIS — N184 Chronic kidney disease, stage 4 (severe): Secondary | ICD-10-CM

## 2020-06-29 DIAGNOSIS — Z794 Long term (current) use of insulin: Secondary | ICD-10-CM | POA: Diagnosis not present

## 2020-06-29 DIAGNOSIS — E1122 Type 2 diabetes mellitus with diabetic chronic kidney disease: Secondary | ICD-10-CM | POA: Diagnosis not present

## 2020-06-29 LAB — POCT GLYCOSYLATED HEMOGLOBIN (HGB A1C): Hemoglobin A1C: 8.9 % — AB (ref 4.0–5.6)

## 2020-06-29 MED ORDER — NOVOLIN 70/30 RELION (70-30) 100 UNIT/ML ~~LOC~~ SUSP: 105.0000 [IU] | Freq: Every day | SUBCUTANEOUS | 3 refills | Status: AC

## 2020-06-29 NOTE — Patient Instructions (Addendum)
Your blood pressure is high today.  Please see your primary care provider soon, to have it rechecked check your blood sugar twice a day.  vary the time of day when you check, between before the 3 meals, and at bedtime.  also check if you have symptoms of your blood sugar being too high or too low.  please keep a record of the readings and bring it to your next appointment here.  You can write it on any piece of paper.  please call us sooner if your blood sugar goes below 70, or if you have a lot of readings over 200.   Please increase the insulin to 105 units with breakfast.   On this type of insulin schedule, you should eat meals on a regular schedule.  If a meal is missed or significantly delayed, your blood sugar could go low.   Please come back for a follow-up appointment in 3 months.

## 2020-06-29 NOTE — Progress Notes (Signed)
Subjective:    Patient ID: Krista Strickland, female    DOB: 19-Jun-1950, 70 y.o.   MRN: [id number]  HPI Pt returns for f/u of diabetes.   DM type: insulin-requiring type 2 Dx'ed: 4193 Complications: PN, stage 4 CRI and CVA.  Therapy: insulin since 2010 GDM: never DKA: never Severe hypoglycemia: never.   Pancreatitis: never.   SDOH: she cannot afford insulin analogs Other: she declines weight loss surgery; she had cutaneous reactions to NPH and lantus; she has declined MDI's. so we had to try 70/30 (the NPH component caused no reaction this time).  She takes 70/30, due to the pattern of cbg's; fructosamine converts to A1c 0.6 higher than A1c itself Interval history:  no cbg record, but states cbg's vary from 140-226.  She checks fasting only.  pt states she feels well in general.  She says she never misses the insulin.   Pt also has small multinodular goiter (euthyroid; f/u US in 2017 showed no change and no f/u needed; she is euthyroid off rx).   Past Medical History:  Diagnosis Date  . Anemia   . Arthritis   . CKD (chronic kidney disease) stage 3, GFR 30-59 ml/min (HCC)   . Diabetes mellitus, type 2 ([place])   . Essential hypertension   . GERD (gastroesophageal reflux disease)   . Gout   . History of MRSA infection 03/2009  . History of stroke 2013  . HOCM (hypertrophic obstructive cardiomyopathy) ([place])   . Hypercalcemia 2017   Managed by nephrology  . Hyperlipidemia   . Obesity   . Psoriasis   . Uterine cancer ([place])   . 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 ABDOMINAL HYSTERECTOMY    . APPENDECTOMY  2012  . COLONOSCOPY WITH PROPOFOL N/A 08/24/2016   Procedure: COLONOSCOPY WITH PROPOFOL;  Surgeon: Rogene Houston, MD;  [facility];  Service: Endoscopy;  Laterality: N/A;  10:30  . Excison of right breast cyst    . LAPAROSCOPIC SALPINGO OOPHERECTOMY Right 04/22/2012   Procedure: LAPAROSCOPIC SALPINGO  OOPHORECTOMY;  Surgeon: Jonnie Kind, MD;  Location: [facility];  Service: Gynecology;  Laterality: Right;  end 11:17  . MASS EXCISION N/A 04/22/2012   Procedure: EXCISION SKIN TAGS NECK AND HEAD;  Surgeon: Jonnie Kind, MD;  Location: [facility];  Service: Gynecology;  Laterality: N/A;  start 11:19  . PARTIAL HYSTERECTOMY    . POLYPECTOMY  08/24/2016   Procedure: POLYPECTOMY;  Surgeon: Rogene Houston, MD;  [facility];  Service: Endoscopy;;  colon  . Right neck biopsy      Social History   Socioeconomic History  . Marital status: Married    Spouse name: Not on file  . Number of children: 1  . Years of education: Not on file  . Highest education level: 11th grade  Occupational History  . Occupation: disabled     Fish farm manager: UNEMPLOYED  Tobacco Use  . Smoking status: Former Smoker    Packs/day: 0.25    Years: 12.00    Pack years: 3.00    Quit date: 07/16/1978    Years since quitting: 41.9  . Smokeless tobacco: Never Used  Vaping Use  . Vaping Use: Never used  Substance and Sexual Activity  . Alcohol use: No  . Drug use: No  . Sexual activity: Yes    Birth control/protection: Surgical  Other Topics Concern  . Not on file  Social History Narrative  . Not on file   Social Determinants of Health   Financial Resource Strain: Low Risk   . Difficulty of Paying Living Expenses: Not hard at all  Food Insecurity: No Food Insecurity  . Worried About Charity fundraiser in the Last Year: Never true  . Ran Out of Food in the Last Year: Never true  Transportation Needs: No Transportation Needs  . Lack of Transportation (Medical): No  . Lack of Transportation (Non-Medical): No  Physical Activity: Inactive  . Days of Exercise per Week: 0 days  . Minutes of Exercise per Session: 0 min  Stress: No Stress Concern Present  . Feeling of Stress : Only a little  Social Connections: Socially Isolated  . Frequency of Communication with Friends and Family: Never  . Frequency of  Social Gatherings with Friends and Family: Never  . Attends Religious Services: Never  . Active Member of Clubs or Organizations: No  . Attends Archivist Meetings: Never  . Marital Status: Married  Human resources officer Violence: Not At Risk  . Fear of Current or Ex-Partner: No  . Emotionally Abused: No  . Physically Abused: No  . Sexually Abused: No    Current Outpatient Medications on File Prior to Visit  Medication Sig Dispense Refill  . allopurinol (ZYLOPRIM) 300 MG tablet Take 300 mg by mouth daily.    Marland Kitchen aspirin 325 MG tablet Take 1 tablet (325 mg total) by mouth every morning. 30 tablet 0  . cyclobenzaprine (FLEXERIL) 10 MG tablet Take 1 tablet (10 mg total) by mouth at bedtime. One tablet every night at bedtime as needed for spasm. 30 tablet 0  . DILT-XR 180 MG 24 hr capsule Take 1 capsule by mouth once daily 90 capsule 0  . docusate sodium (COLACE) 100 MG capsule Take 100 mg by mouth daily as needed for mild constipation or moderate constipation.     Marland Kitchen epoetin alfa (EPOGEN) 3000 UNIT/ML injection Inject 3,000 Units into the vein every 14 (fourteen) days.     Marland Kitchen ezetimibe (ZETIA) 10 MG tablet Take 1 tablet (10 mg total) by mouth daily. 90 tablet 3  . febuxostat (ULORIC) 40 MG tablet One daily for gout 30 tablet 5  . hydrALAZINE (APRESOLINE) 25 MG tablet TAKE 1 TABLET BY MOUTH TWICE DAILY- ONCE AT 9AM AND ONCE AT 9PM 180 tablet 0  . HYDROcodone-acetaminophen (NORCO/VICODIN) 5-325 MG tablet One tablet every six hours for pain.  Limit 7 days. 28 tablet 0  . metolazone (ZAROXOLYN) 5 MG tablet Take 1 tablet by mouth once daily 90 tablet 0  . metoprolol tartrate (LOPRESSOR) 100 MG tablet Take 1 tablet by mouth twice daily 180 tablet 0  . ONETOUCH ULTRA test strip USE 1 STRIP TO CHECK GLUCOSE TWICE DAILY 200 each 3  . pravastatin (PRAVACHOL) 20 MG tablet TAKE 1 TABLET BY MOUTH ONCE DAILY BEFORE BREAKFAST 90 tablet 0  . torsemide (DEMADEX) 20 MG tablet Take 2 tablets (40 mg total) by  mouth 2 (two) times daily. 56 tablet 0  . potassium chloride (KLOR-CON) 10 MEQ tablet Take 3 tablets (30 mEq total) by mouth 3 (three) times daily. 810 tablet 3   No current facility-administered medications on file prior to visit.    Allergies  Allergen Reactions  . Benazepril Swelling  . Metronidazole Hives  . Mobic [Meloxicam] Hives  . Penicillins Hives and Swelling  . Sulfonamide Derivatives Hives    Family History  Problem Relation Age of Onset  .  Hypertension Brother   . Gout Brother   . Prostate cancer Brother   . Hypertension Brother   . Prostate cancer Brother   . Gout Brother   . Hypertension Sister   . Gout Sister   . Leukemia Sister 53  . Pancreatic cancer Sister 76  . Gout Sister   . Prostate cancer Brother   . Diabetes Neg Hx     BP (!) 176/86 (BP Location: Right Arm, Patient Position: Sitting, Cuff Size: Large)   Pulse 77   Ht 5\' 3"  (1.6 m)   Wt 187 lb 9.6 oz (85.1 kg)   SpO2 95%   BMI 33.23 kg/m    Review of Systems     Objective:   Physical Exam VITAL SIGNS:  See vs page GENERAL: no distress Pulses: dorsalis pedis absent bilat.   MSK: no deformity of the feet CV: 1+ bilat leg edema.   Skin:  no ulcer on the feet.  normal color and temp on the feet.   Neuro: sensation is intact to touch on the feet, but decreased from normal.    Lab Results  Component Value Date   HGBA1C 8.9 (A) 06/29/2020       Assessment & Plan:  Insulin-requiring type 2 DM: uncontrolled  Patient Instructions  Your blood pressure is high today.  Please see your primary care provider soon, to have it rechecked check your blood sugar twice a day.  vary the time of day when you check, between before the 3 meals, and at bedtime.  also check if you have symptoms of your blood sugar being too high or too low.  please keep a record of the readings and bring it to your next appointment here.  You can write it on any piece of paper.  please call us sooner if your blood sugar  goes below 70, or if you have a lot of readings over 200.   Please increase the insulin to 105 units with breakfast.   On this type of insulin schedule, you should eat meals on a regular schedule.  If a meal is missed or significantly delayed, your blood sugar could go low.   Please come back for a follow-up appointment in 3 months.

## 2020-06-30 ENCOUNTER — Encounter (HOSPITAL_COMMUNITY)
Admission: RE | Admit: 2020-06-30 | Discharge: 2020-06-30 | Disposition: A | Discharge status: Home / Self Care | Payer: HMO | Source: Ambulatory Visit | Attending: Nephrology | Admitting: Nephrology

## 2020-06-30 ENCOUNTER — Other Ambulatory Visit: Payer: Self-pay

## 2020-06-30 DIAGNOSIS — N184 Chronic kidney disease, stage 4 (severe): Secondary | ICD-10-CM | POA: Insufficient documentation

## 2020-06-30 DIAGNOSIS — D631 Anemia in chronic kidney disease: Secondary | ICD-10-CM | POA: Diagnosis not present

## 2020-06-30 LAB — IRON AND TIBC
Iron: 64 ug/dL (ref 28–170)
Saturation Ratios: 20 % (ref 10.4–31.8)
TIBC: 313 ug/dL (ref 250–450)
UIBC: 249 ug/dL

## 2020-06-30 LAB — CBC
HCT: 39.3 % (ref 36.0–46.0)
Hemoglobin: 12.6 g/dL (ref 12.0–15.0)
MCH: 29.2 pg (ref 26.0–34.0)
MCHC: 32.1 g/dL (ref 30.0–36.0)
MCV: 91 fL (ref 80.0–100.0)
Platelets: 241 10*3/uL (ref 150–400)
RBC: 4.32 MIL/uL (ref 3.87–5.11)
RDW: 16.8 % — ABNORMAL HIGH (ref 11.5–15.5)
WBC: 10.5 10*3/uL (ref 4.0–10.5)
nRBC: 0 % (ref 0.0–0.2)

## 2020-06-30 LAB — PROTEIN / CREATININE RATIO, URINE
Creatinine, Urine: 45.54 mg/dL
Protein Creatinine Ratio: 2.77 mg/mg{Cre} — ABNORMAL HIGH (ref 0.00–0.15)
Total Protein, Urine: 126 mg/dL

## 2020-06-30 LAB — RENAL FUNCTION PANEL
Albumin: 3.7 g/dL (ref 3.5–5.0)
Anion gap: 11 (ref 5–15)
BUN: 81 mg/dL — ABNORMAL HIGH (ref 8–23)
CO2: 28 mmol/L (ref 22–32)
Calcium: 10.2 mg/dL (ref 8.9–10.3)
Chloride: 92 mmol/L — ABNORMAL LOW (ref 98–111)
Creatinine, Ser: 2.88 mg/dL — ABNORMAL HIGH (ref 0.44–1.00)
GFR, Estimated: 17 mL/min — ABNORMAL LOW (ref >60)
Glucose, Bld: 328 mg/dL — ABNORMAL HIGH (ref 70–99)
Phosphorus: 3.4 mg/dL (ref 2.5–4.6)
Potassium: 4.2 mmol/L (ref 3.5–5.1)
Sodium: 131 mmol/L — ABNORMAL LOW (ref 135–145)

## 2020-06-30 LAB — POCT HEMOGLOBIN-HEMACUE: Hemoglobin: 12.6 g/dL (ref 12.0–15.0)

## 2020-06-30 MED ORDER — EPOETIN ALFA 3000 UNIT/ML IJ SOLN: 3000.0000 [IU] | INTRAMUSCULAR | Status: DC

## 2020-07-05 DIAGNOSIS — H20022 Recurrent acute iridocyclitis, left eye: Secondary | ICD-10-CM | POA: Diagnosis not present

## 2020-07-05 DIAGNOSIS — H1820 Unspecified corneal edema: Secondary | ICD-10-CM | POA: Diagnosis not present

## 2020-07-05 DIAGNOSIS — H35033 Hypertensive retinopathy, bilateral: Secondary | ICD-10-CM | POA: Diagnosis not present

## 2020-07-05 DIAGNOSIS — Z794 Long term (current) use of insulin: Secondary | ICD-10-CM | POA: Diagnosis not present

## 2020-07-05 DIAGNOSIS — I10 Essential (primary) hypertension: Secondary | ICD-10-CM | POA: Diagnosis not present

## 2020-07-05 DIAGNOSIS — E1136 Type 2 diabetes mellitus with diabetic cataract: Secondary | ICD-10-CM | POA: Diagnosis not present

## 2020-07-05 DIAGNOSIS — H2513 Age-related nuclear cataract, bilateral: Secondary | ICD-10-CM | POA: Diagnosis not present

## 2020-07-14 ENCOUNTER — Other Ambulatory Visit: Payer: Self-pay

## 2020-07-14 ENCOUNTER — Encounter (HOSPITAL_COMMUNITY)
Admission: RE | Admit: 2020-07-14 | Discharge: 2020-07-14 | Disposition: A | Discharge status: Home / Self Care | Payer: HMO | Source: Ambulatory Visit | Attending: Nephrology | Admitting: Nephrology

## 2020-07-14 ENCOUNTER — Encounter (HOSPITAL_COMMUNITY): Payer: Self-pay

## 2020-07-14 DIAGNOSIS — N184 Chronic kidney disease, stage 4 (severe): Secondary | ICD-10-CM | POA: Diagnosis not present

## 2020-07-14 LAB — POCT HEMOGLOBIN-HEMACUE: Hemoglobin: 12.5 g/dL (ref 12.0–15.0)

## 2020-07-14 MED ORDER — EPOETIN ALFA 3000 UNIT/ML IJ SOLN: 3000.0000 [IU] | INTRAMUSCULAR | Status: DC

## 2020-07-15 LAB — PTH, INTACT AND CALCIUM
Calcium, Total (PTH): 9.5 mg/dL (ref 8.7–10.3)
PTH: 171 pg/mL — ABNORMAL HIGH (ref 15–65)

## 2020-07-28 ENCOUNTER — Encounter (HOSPITAL_COMMUNITY)
Admission: RE | Admit: 2020-07-28 | Discharge: 2020-07-28 | Disposition: A | Discharge status: Home / Self Care | Payer: HMO | Source: Ambulatory Visit | Attending: Nephrology | Admitting: Nephrology

## 2020-07-28 ENCOUNTER — Other Ambulatory Visit: Payer: Self-pay

## 2020-07-28 DIAGNOSIS — N184 Chronic kidney disease, stage 4 (severe): Secondary | ICD-10-CM | POA: Diagnosis not present

## 2020-07-28 LAB — CBC
HCT: 38.5 % (ref 36.0–46.0)
Hemoglobin: 12.2 g/dL (ref 12.0–15.0)
MCH: 29.8 pg (ref 26.0–34.0)
MCHC: 31.7 g/dL (ref 30.0–36.0)
MCV: 94.1 fL (ref 80.0–100.0)
Platelets: 272 10*3/uL (ref 150–400)
RBC: 4.09 MIL/uL (ref 3.87–5.11)
RDW: 15.6 % — ABNORMAL HIGH (ref 11.5–15.5)
WBC: 11.9 10*3/uL — ABNORMAL HIGH (ref 4.0–10.5)
nRBC: 0 % (ref 0.0–0.2)

## 2020-07-28 LAB — RENAL FUNCTION PANEL
Albumin: 3.4 g/dL — ABNORMAL LOW (ref 3.5–5.0)
Anion gap: 10 (ref 5–15)
BUN: 75 mg/dL — ABNORMAL HIGH (ref 8–23)
CO2: 27 mmol/L (ref 22–32)
Calcium: 9.4 mg/dL (ref 8.9–10.3)
Chloride: 91 mmol/L — ABNORMAL LOW (ref 98–111)
Creatinine, Ser: 2.92 mg/dL — ABNORMAL HIGH (ref 0.44–1.00)
GFR, Estimated: 17 mL/min — ABNORMAL LOW (ref >60)
Glucose, Bld: 259 mg/dL — ABNORMAL HIGH (ref 70–99)
Phosphorus: 3.9 mg/dL (ref 2.5–4.6)
Potassium: 4.2 mmol/L (ref 3.5–5.1)
Sodium: 128 mmol/L — ABNORMAL LOW (ref 135–145)

## 2020-07-28 LAB — IRON AND TIBC
Iron: 42 ug/dL (ref 28–170)
Saturation Ratios: 15 % (ref 10.4–31.8)
TIBC: 288 ug/dL (ref 250–450)
UIBC: 246 ug/dL

## 2020-07-28 LAB — POCT HEMOGLOBIN-HEMACUE: Hemoglobin: 12 g/dL (ref 12.0–15.0)

## 2020-07-28 MED ORDER — EPOETIN ALFA 3000 UNIT/ML IJ SOLN: 3000.0000 [IU] | Freq: Once | INTRAMUSCULAR | Status: DC

## 2020-07-29 DIAGNOSIS — E876 Hypokalemia: Secondary | ICD-10-CM | POA: Diagnosis not present

## 2020-07-29 DIAGNOSIS — E1129 Type 2 diabetes mellitus with other diabetic kidney complication: Secondary | ICD-10-CM | POA: Diagnosis not present

## 2020-07-29 DIAGNOSIS — D631 Anemia in chronic kidney disease: Secondary | ICD-10-CM | POA: Diagnosis not present

## 2020-07-29 DIAGNOSIS — R809 Proteinuria, unspecified: Secondary | ICD-10-CM | POA: Diagnosis not present

## 2020-07-29 DIAGNOSIS — E871 Hypo-osmolality and hyponatremia: Secondary | ICD-10-CM | POA: Diagnosis not present

## 2020-07-29 DIAGNOSIS — I129 Hypertensive chronic kidney disease with stage 1 through stage 4 chronic kidney disease, or unspecified chronic kidney disease: Secondary | ICD-10-CM | POA: Diagnosis not present

## 2020-07-29 DIAGNOSIS — N189 Chronic kidney disease, unspecified: Secondary | ICD-10-CM | POA: Diagnosis not present

## 2020-07-29 DIAGNOSIS — R600 Localized edema: Secondary | ICD-10-CM | POA: Diagnosis not present

## 2020-07-29 DIAGNOSIS — E1122 Type 2 diabetes mellitus with diabetic chronic kidney disease: Secondary | ICD-10-CM | POA: Diagnosis not present

## 2020-07-29 LAB — PTH, INTACT AND CALCIUM
Calcium, Total (PTH): 9.5 mg/dL (ref 8.7–10.3)
PTH: 124 pg/mL — ABNORMAL HIGH (ref 15–65)

## 2020-08-01 IMAGING — MG DIGITAL SCREENING BILATERAL MAMMOGRAM WITH TOMO AND CAD
6 of 10 series · 6 of 30 positions shown · non-contrast
Comparison: Previous exam(s).

CLINICAL DATA: Screening.

EXAM:
DIGITAL SCREENING BILATERAL MAMMOGRAM WITH TOMO AND CAD

[L CC synth-2D]
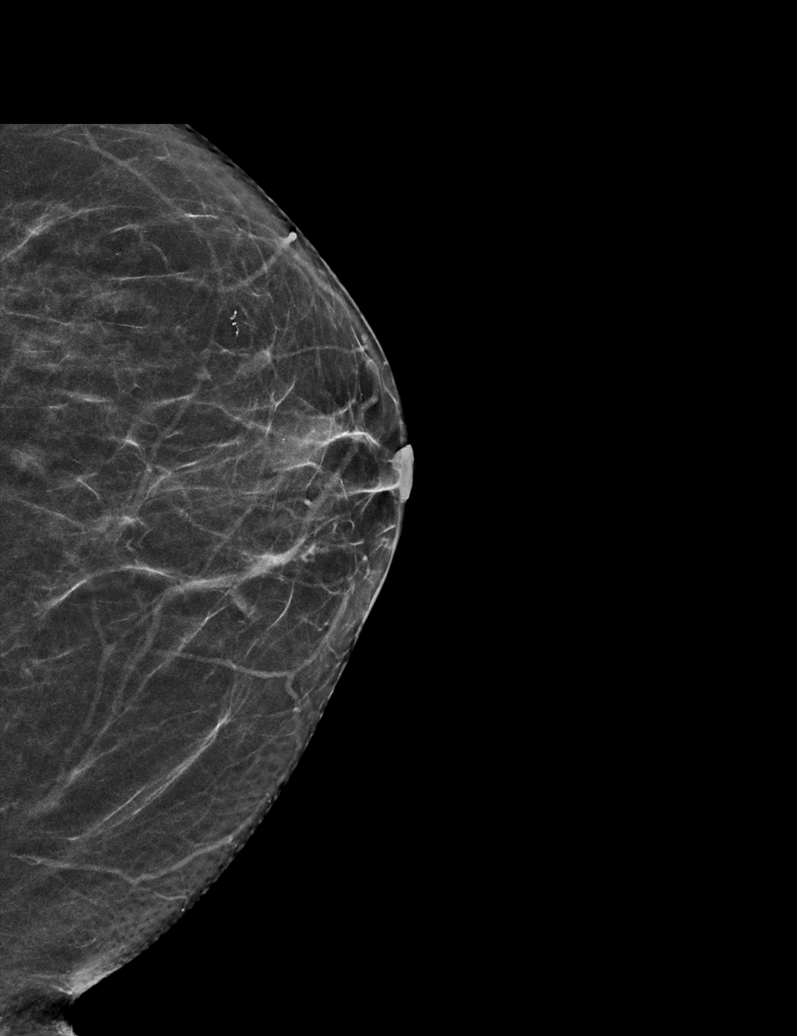

[R CC synth-2D]
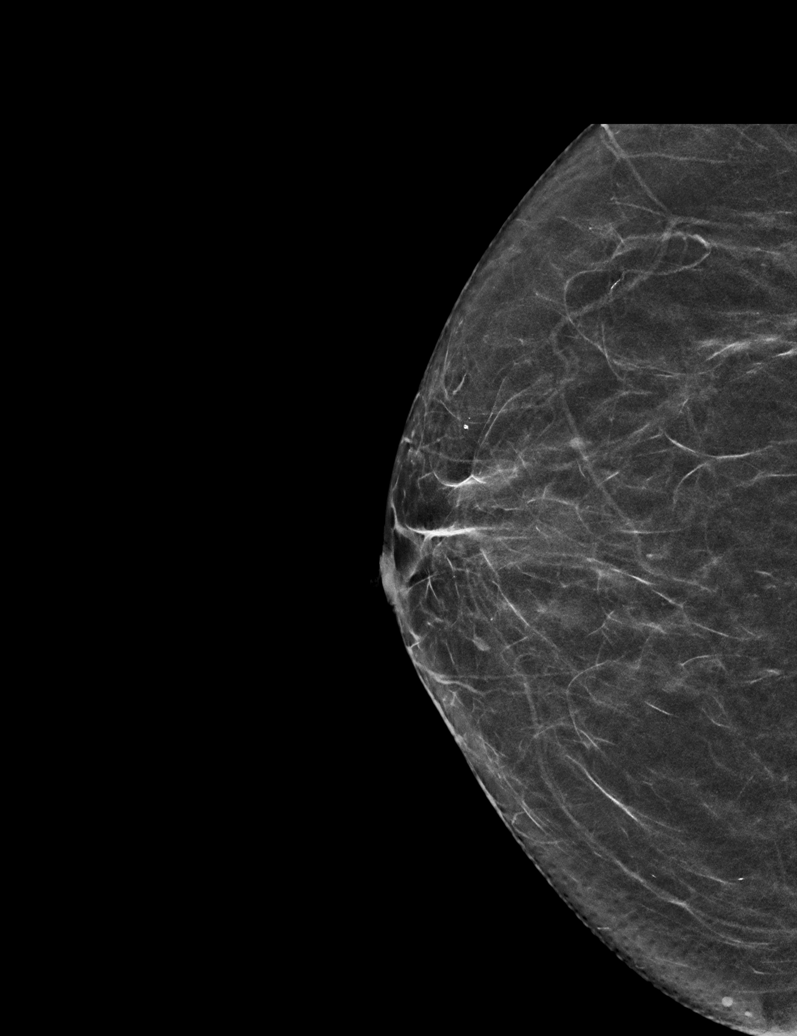

[R MLO synth-2D (1 of 2)]
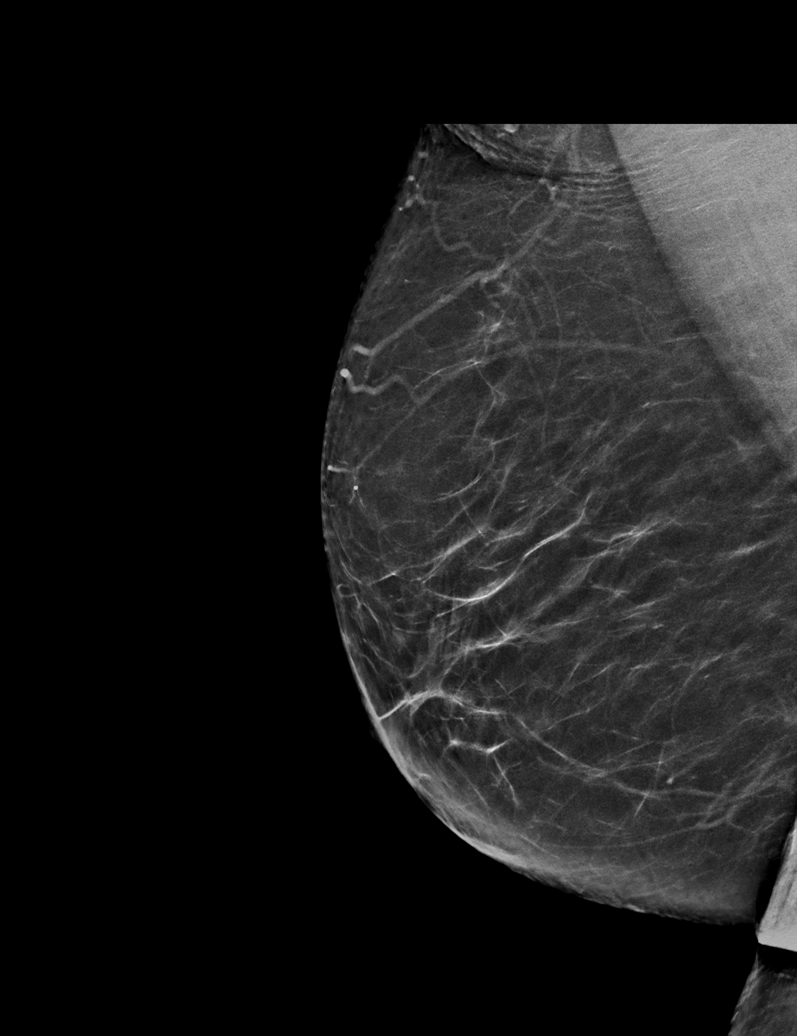

[R MLO synth-2D (2 of 2)]
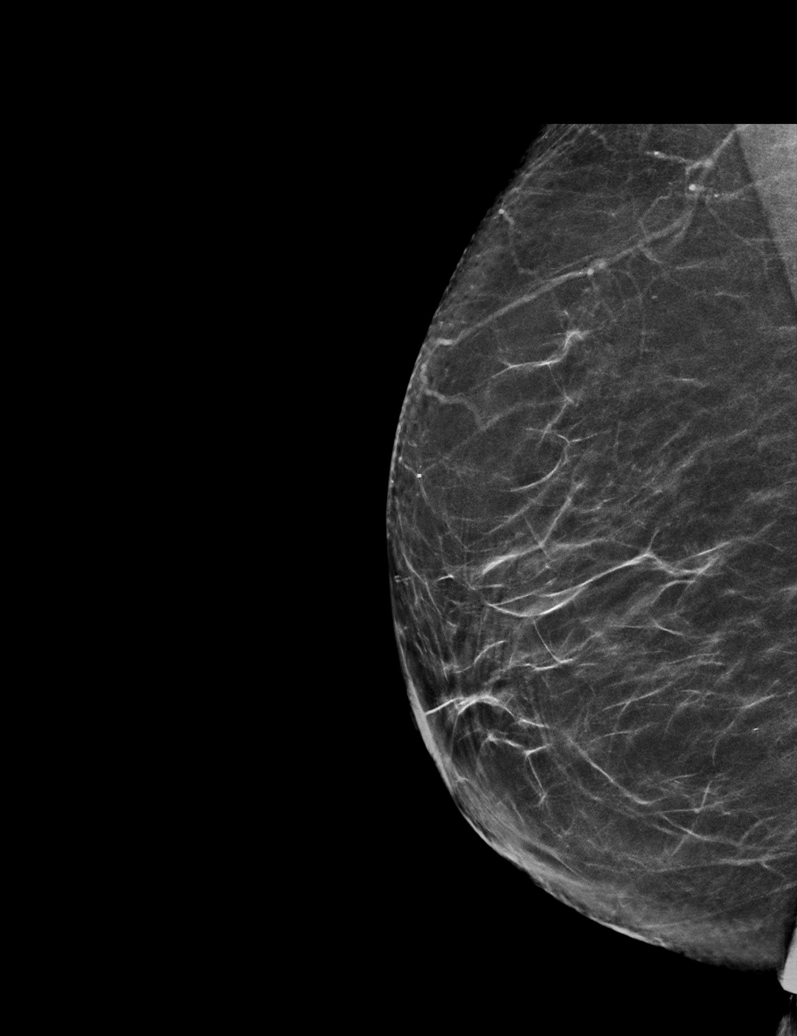

[L MLO synth-2D]
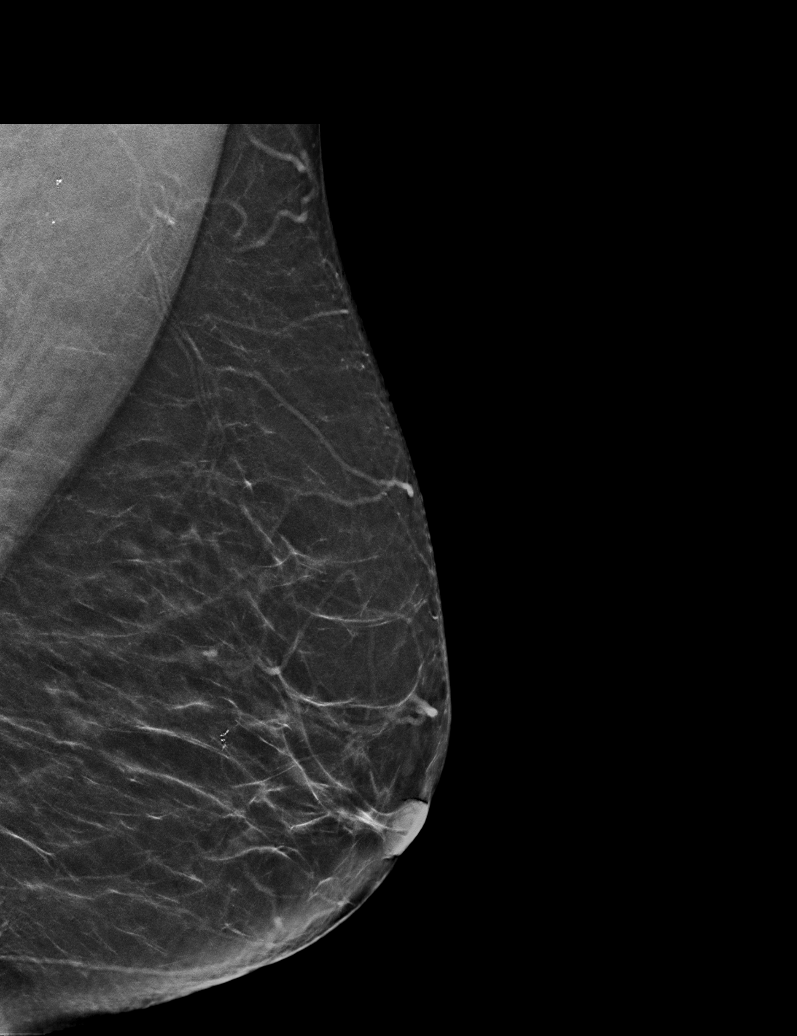

[R CC tomo · tomo slice 27/54.0]
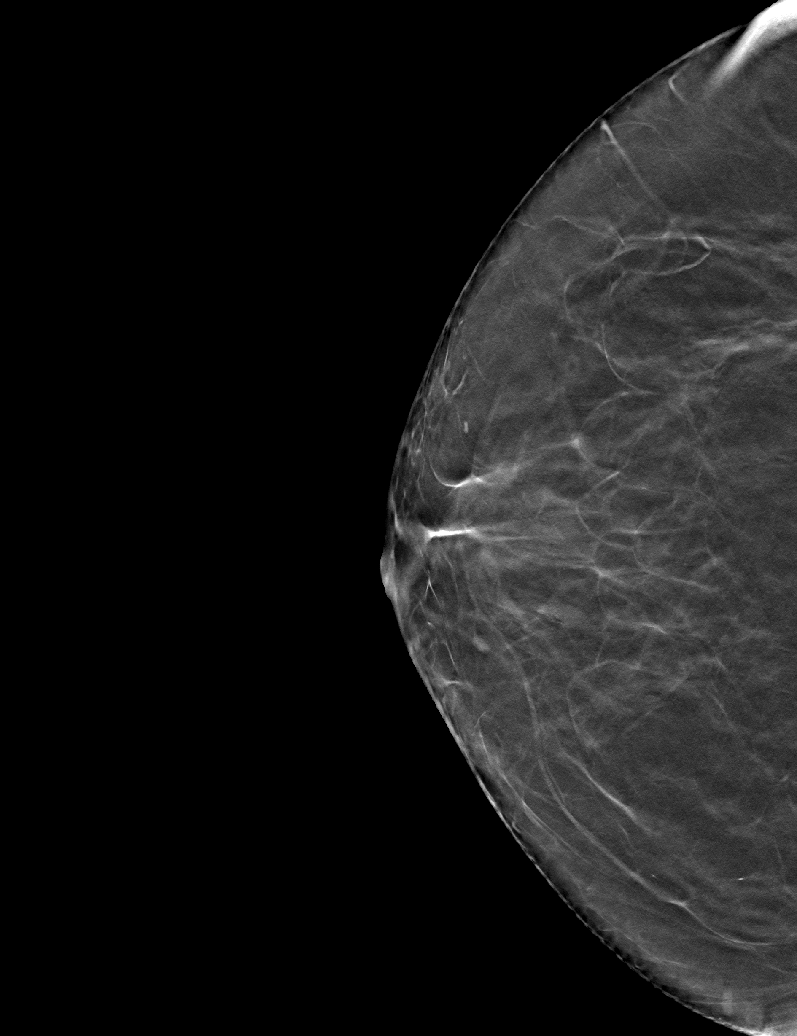

[6 of 30 positions shown; findings below may reference images not displayed]

ACR Breast Density Category b: There are scattered areas of
fibroglandular density.
FINDINGS: There are no findings suspicious for malignancy. Images were
processed with CAD.
IMPRESSION: No mammographic evidence of malignancy. A result letter of this
screening mammogram will be mailed directly to the patient.

RECOMMENDATION:
Screening mammogram in one year. (Code:[id number])

BI-RADS CATEGORY  1: Negative.

## 2020-08-06 ENCOUNTER — Other Ambulatory Visit: Payer: Self-pay | Admitting: Family Medicine

## 2020-08-08 DIAGNOSIS — B351 Tinea unguium: Secondary | ICD-10-CM | POA: Diagnosis not present

## 2020-08-08 DIAGNOSIS — M79675 Pain in left toe(s): Secondary | ICD-10-CM | POA: Diagnosis not present

## 2020-08-08 DIAGNOSIS — E1151 Type 2 diabetes mellitus with diabetic peripheral angiopathy without gangrene: Secondary | ICD-10-CM | POA: Diagnosis not present

## 2020-08-08 DIAGNOSIS — M79674 Pain in right toe(s): Secondary | ICD-10-CM | POA: Diagnosis not present

## 2020-08-11 ENCOUNTER — Other Ambulatory Visit: Payer: Self-pay

## 2020-08-11 ENCOUNTER — Encounter (HOSPITAL_COMMUNITY)
Admission: RE | Admit: 2020-08-11 | Discharge: 2020-08-11 | Disposition: A | Discharge status: Home / Self Care | Payer: HMO | Source: Ambulatory Visit | Attending: Nephrology | Admitting: Nephrology

## 2020-08-11 DIAGNOSIS — N184 Chronic kidney disease, stage 4 (severe): Secondary | ICD-10-CM | POA: Insufficient documentation

## 2020-08-11 DIAGNOSIS — D631 Anemia in chronic kidney disease: Secondary | ICD-10-CM | POA: Insufficient documentation

## 2020-08-11 LAB — POCT HEMOGLOBIN-HEMACUE: Hemoglobin: 12.2 g/dL (ref 12.0–15.0)

## 2020-08-11 MED ORDER — EPOETIN ALFA 3000 UNIT/ML IJ SOLN: 3000.0000 [IU] | Freq: Once | INTRAMUSCULAR | Status: DC

## 2020-08-13 ENCOUNTER — Other Ambulatory Visit: Payer: Self-pay | Admitting: Family Medicine

## 2020-08-13 ENCOUNTER — Telehealth: Payer: Self-pay | Admitting: Orthopaedic Surgery

## 2020-08-25 ENCOUNTER — Encounter (HOSPITAL_COMMUNITY)
Admission: RE | Admit: 2020-08-25 | Discharge: 2020-08-25 | Disposition: A | Discharge status: Home / Self Care | Payer: HMO | Source: Ambulatory Visit | Attending: Nephrology | Admitting: Nephrology

## 2020-08-25 ENCOUNTER — Other Ambulatory Visit: Payer: Self-pay

## 2020-08-25 ENCOUNTER — Encounter (HOSPITAL_COMMUNITY): Payer: Self-pay

## 2020-08-25 DIAGNOSIS — N184 Chronic kidney disease, stage 4 (severe): Secondary | ICD-10-CM | POA: Diagnosis not present

## 2020-08-25 LAB — POCT HEMOGLOBIN-HEMACUE: Hemoglobin: 13 g/dL (ref 12.0–15.0)

## 2020-08-25 MED ORDER — EPOETIN ALFA 3000 UNIT/ML IJ SOLN: 3000.0000 [IU] | INTRAMUSCULAR | Status: DC

## 2020-09-03 ENCOUNTER — Other Ambulatory Visit: Payer: Self-pay | Admitting: Family Medicine

## 2020-09-08 ENCOUNTER — Encounter (HOSPITAL_COMMUNITY)
Admission: RE | Admit: 2020-09-08 | Discharge: 2020-09-08 | Disposition: A | Discharge status: Home / Self Care | Payer: HMO | Source: Ambulatory Visit | Attending: Nephrology | Admitting: Nephrology

## 2020-09-08 ENCOUNTER — Other Ambulatory Visit: Payer: Self-pay

## 2020-09-08 DIAGNOSIS — N184 Chronic kidney disease, stage 4 (severe): Secondary | ICD-10-CM | POA: Diagnosis not present

## 2020-09-08 DIAGNOSIS — D631 Anemia in chronic kidney disease: Secondary | ICD-10-CM | POA: Insufficient documentation

## 2020-09-08 LAB — RENAL FUNCTION PANEL
Albumin: 3.6 g/dL (ref 3.5–5.0)
Anion gap: 10 (ref 5–15)
BUN: 93 mg/dL — ABNORMAL HIGH (ref 8–23)
CO2: 24 mmol/L (ref 22–32)
Calcium: 9 mg/dL (ref 8.9–10.3)
Chloride: 92 mmol/L — ABNORMAL LOW (ref 98–111)
Creatinine, Ser: 3.66 mg/dL — ABNORMAL HIGH (ref 0.44–1.00)
GFR, Estimated: 13 mL/min — ABNORMAL LOW (ref >60)
Glucose, Bld: 218 mg/dL — ABNORMAL HIGH (ref 70–99)
Phosphorus: 5 mg/dL — ABNORMAL HIGH (ref 2.5–4.6)
Potassium: 4.2 mmol/L (ref 3.5–5.1)
Sodium: 126 mmol/L — ABNORMAL LOW (ref 135–145)

## 2020-09-08 LAB — CBC WITH DIFFERENTIAL/PLATELET
Abs Immature Granulocytes: 0.02 10*3/uL (ref 0.00–0.07)
Basophils Absolute: 0.1 10*3/uL (ref 0.0–0.1)
Basophils Relative: 1 %
Eosinophils Absolute: 0.1 10*3/uL (ref 0.0–0.5)
Eosinophils Relative: 2 %
HCT: 39.4 % (ref 36.0–46.0)
Hemoglobin: 12.9 g/dL (ref 12.0–15.0)
Immature Granulocytes: 0 %
Lymphocytes Relative: 17 %
Lymphs Abs: 1.3 10*3/uL (ref 0.7–4.0)
MCH: 29.3 pg (ref 26.0–34.0)
MCHC: 32.7 g/dL (ref 30.0–36.0)
MCV: 89.5 fL (ref 80.0–100.0)
Monocytes Absolute: 0.6 10*3/uL (ref 0.1–1.0)
Monocytes Relative: 8 %
Neutro Abs: 5.7 10*3/uL (ref 1.7–7.7)
Neutrophils Relative %: 72 %
Platelets: 288 10*3/uL (ref 150–400)
RBC: 4.4 MIL/uL (ref 3.87–5.11)
RDW: 14 % (ref 11.5–15.5)
WBC: 7.8 10*3/uL (ref 4.0–10.5)
nRBC: 0 % (ref 0.0–0.2)

## 2020-09-08 LAB — POCT HEMOGLOBIN-HEMACUE: Hemoglobin: 12.8 g/dL (ref 12.0–15.0)

## 2020-09-08 LAB — IRON AND TIBC
Iron: 74 ug/dL (ref 28–170)
Saturation Ratios: 22 % (ref 10.4–31.8)
TIBC: 338 ug/dL (ref 250–450)
UIBC: 264 ug/dL

## 2020-09-08 NOTE — Progress Notes (Signed)
Talked with Thayer Headings at [facility]. Informed pt has not had an injection since April 2022. Stated she will notify Dr Theador Hawthorne and re-evaluate pt status

## 2020-09-09 LAB — PTH, INTACT AND CALCIUM
Calcium, Total (PTH): 9.2 mg/dL (ref 8.7–10.3)
PTH: 359 pg/mL — ABNORMAL HIGH (ref 15–65)

## 2020-09-13 ENCOUNTER — Other Ambulatory Visit: Payer: Self-pay

## 2020-09-13 ENCOUNTER — Encounter: Payer: Self-pay | Admitting: Orthopaedic Surgery

## 2020-09-13 ENCOUNTER — Ambulatory Visit (INDEPENDENT_AMBULATORY_CARE_PROVIDER_SITE_OTHER): Payer: HMO | Admitting: Orthopaedic Surgery

## 2020-09-13 ENCOUNTER — Ambulatory Visit: Payer: HMO | Admitting: Cardiology

## 2020-09-13 ENCOUNTER — Encounter: Payer: Self-pay | Admitting: Cardiology

## 2020-09-13 VITALS — BP 148/62 | HR 72 | Ht 63.0 in | Wt 180.0 lb

## 2020-09-13 VITALS — BP 181/84 | HR 70 | Ht 63.0 in | Wt 180.2 lb

## 2020-09-13 DIAGNOSIS — M1A0790 Idiopathic chronic gout, unspecified ankle and foot, without tophus (tophi): Secondary | ICD-10-CM

## 2020-09-13 DIAGNOSIS — N184 Chronic kidney disease, stage 4 (severe): Secondary | ICD-10-CM | POA: Diagnosis not present

## 2020-09-13 DIAGNOSIS — E0849 Diabetes mellitus due to underlying condition with other diabetic neurological complication: Secondary | ICD-10-CM

## 2020-09-13 DIAGNOSIS — I421 Obstructive hypertrophic cardiomyopathy: Secondary | ICD-10-CM | POA: Diagnosis not present

## 2020-09-13 MED ORDER — HYDROCODONE-ACETAMINOPHEN 5-325 MG PO TABS: ORAL_TABLET | ORAL | 0 refills | Status: AC

## 2020-09-13 MED ORDER — TORSEMIDE 20 MG PO TABS: 50.0000 mg | ORAL_TABLET | Freq: Two times a day (BID) | ORAL | 3 refills | Status: AC

## 2020-09-13 MED ORDER — ASPIRIN EC 81 MG PO TBEC: 81.0000 mg | DELAYED_RELEASE_TABLET | Freq: Every day | ORAL | 3 refills | Status: AC

## 2020-09-13 NOTE — Progress Notes (Signed)
My back hurts.  She has chronic lower back pain.  She has no new trauma, no weakness. She is taking her medicine.  She is exercising much. I have encouraged walking.  Her blood sugars have been elevated.  A1C is elevated.  I have asked that she control her diet better.  Spine/Pelvis examination:  Inspection:  Overall, sacoiliac joint benign and hips nontender; without crepitus or defects.   Thoracic spine inspection: Alignment normal without kyphosis present   Lumbar spine inspection:  Alignment  with normal lumbar lordosis, without scoliosis apparent.   Thoracic spine palpation:  without tenderness of spinal processes   Lumbar spine palpation: without tenderness of lumbar area; without tightness of lumbar muscles    Range of Motion:   Lumbar flexion, forward flexion is normal without pain or tenderness    Lumbar extension is full without pain or tenderness   Left lateral bend is normal without pain or tenderness   Right lateral bend is normal without pain or tenderness   Straight leg raising is normal  Strength & tone: normal   Stability overall normal stability  Encounter Diagnoses  Name Primary?   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  I have reviewed the [facility] web site prior to prescribing narcotic medicine for this patient.  Call if any problem.  Precautions discussed.  Electronically Signed Sanjuana Kava, MD 8/16/20222:19 PM

## 2020-09-13 NOTE — Progress Notes (Signed)
Cardiology Office Note  Date: 09/13/2020   ID: [name], DOB Dec 25, 1950, MRN [id number]  PCP:  Krista Helper, MD  Cardiologist:  Krista Lesches, MD Electrophysiologist:  None   Chief Complaint  Patient presents with   Cardiac follow-up     History of Present Illness: Krista Strickland is a 70 y.o. female last seen in February by Krista Strickland, I reviewed the note.  She is here today with her husband for a follow-up visit.  Weight has been stable over the last few healthcare visits.  She has chronic leg edema which is unchanged.  Diuretics have been updated by nephrology, currently on Demadex 50 mg twice daily with potassium supplement and also metolazone. She continues to follow with Dr. Theador Hawthorne, was seen in July.  She has CKD stage IV, recent creatinine 3.66.  I reviewed the remainder of her medications which are noted below.  She does not report any exertional chest pain and has NYHA class II dyspnea with typical activities.  Echocardiogram from August 2021 is noted below.  Past Medical History:  Diagnosis Date   Anemia    Arthritis    CKD (chronic kidney disease) stage 3, GFR 30-59 ml/min (HCC)    Diabetes mellitus, type 2 ([place])    Essential hypertension    GERD (gastroesophageal reflux disease)    Gout    History of MRSA infection 03/2009   History of stroke 2013   HOCM (hypertrophic obstructive cardiomyopathy) ([place])    Hypercalcemia 2017   Managed by nephrology   Hyperlipidemia    Obesity    Psoriasis    Uterine cancer ([place])    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    Past Surgical History:  Procedure Laterality Date   ABDOMINAL HYSTERECTOMY     APPENDECTOMY  2012   COLONOSCOPY WITH PROPOFOL N/A 08/24/2016   Procedure: COLONOSCOPY WITH PROPOFOL;  Surgeon: Rogene Houston, MD;  [facility];  Service: Endoscopy;  Laterality: N/A;  10:30   Excison of right breast cyst     LAPAROSCOPIC SALPINGO OOPHERECTOMY Right 04/22/2012   Procedure:  LAPAROSCOPIC SALPINGO OOPHORECTOMY;  Surgeon: Jonnie Kind, MD;  Location: [facility];  Service: Gynecology;  Laterality: Right;  end 11:17   MASS EXCISION N/A 04/22/2012   Procedure: EXCISION SKIN TAGS NECK AND HEAD;  Surgeon: Jonnie Kind, MD;  Location: [facility];  Service: Gynecology;  Laterality: N/A;  start 11:19   PARTIAL HYSTERECTOMY     POLYPECTOMY  08/24/2016   Procedure: POLYPECTOMY;  Surgeon: Rogene Houston, MD;  [facility];  Service: Endoscopy;;  colon   Right neck biopsy      Current Outpatient Medications  Medication Sig Dispense Refill   allopurinol (ZYLOPRIM) 300 MG tablet Take 300 mg by mouth daily.     aspirin EC 81 MG tablet Take 1 tablet (81 mg total) by mouth daily. Swallow whole. 90 tablet 3   cyclobenzaprine (FLEXERIL) 10 MG tablet TAKE 1 TABLET BY MOUTH  EVERY NIGHT AT  BEDTIME AS NEEDED FOR SPASM 30 tablet 0   DILT-XR 180 MG 24 hr capsule Take 1 capsule by mouth once daily 90 capsule 0   docusate sodium (COLACE) 100 MG capsule Take 100 mg by mouth daily as needed for mild constipation or moderate constipation.      epoetin alfa (EPOGEN) 3000 UNIT/ML injection Inject 3,000 Units into the vein every 14 (fourteen) days.      ezetimibe (ZETIA) 10  MG tablet Take 1 tablet (10 mg total) by mouth daily. 90 tablet 3   febuxostat (ULORIC) 40 MG tablet One daily for gout 30 tablet 5   gabapentin (NEURONTIN) 600 MG tablet Take 600 mg by mouth 3 (three) times daily.     hydrALAZINE (APRESOLINE) 25 MG tablet TAKE 1 TABLET BY MOUTH TWICE DAILY (AT  9:00  AM  AND  9:00  PM) 180 tablet 0   HYDROcodone-acetaminophen (NORCO/VICODIN) 5-325 MG tablet One tablet every six hours for pain.  Limit 7 days. 28 tablet 0   insulin NPH-regular Human (NOVOLIN 70/30 RELION) (70-30) 100 UNIT/ML injection Inject 105 Units into the skin daily with breakfast. 110 mL 3   metolazone (ZAROXOLYN) 5 MG tablet Take 1 tablet by mouth once daily 90 tablet 0   metoprolol tartrate (LOPRESSOR) 100 MG  tablet Take 1 tablet by mouth twice daily 180 tablet 0   ONETOUCH ULTRA test strip USE 1 STRIP TO CHECK GLUCOSE TWICE DAILY 200 each 3   potassium chloride (KLOR-CON) 10 MEQ tablet Take 3 tablets (30 mEq total) by mouth 3 (three) times daily. 810 tablet 3   pravastatin (PRAVACHOL) 20 MG tablet TAKE 1 TABLET BY MOUTH ONCE DAILY BEFORE BREAKFAST 90 tablet 0   prednisoLONE acetate (PRED FORTE) 1 % ophthalmic suspension Place 1 drop into the left eye 2 times daily.     spironolactone (ALDACTONE) 25 MG tablet Take by mouth.     torsemide (DEMADEX) 20 MG tablet Take 2.5 tablets (50 mg total) by mouth 2 (two) times daily. 180 tablet 3   No current facility-administered medications for this visit.   Allergies:  Benazepril, Metronidazole, Mobic [meloxicam], Penicillins, and Sulfonamide derivatives   ROS: No palpitations or syncope.  Physical Exam: VS:  BP (!) 148/62   Pulse 72   Ht 5\' 3"  (1.6 m)   Wt 180 lb (81.6 kg)   SpO2 97%   BMI 31.89 kg/m , BMI Body mass index is 31.89 kg/m.  Wt Readings from Last 3 Encounters:  09/13/20 180 lb (81.6 kg)  08/25/20 187 lb 6.3 oz (85 kg)  08/11/20 187 lb 6.3 oz (85 kg)    General: Patient appears comfortable at rest. HEENT: Conjunctiva and lids normal, wearing a mask. Neck: Supple, no elevated JVP or carotid bruits, no thyromegaly. Lungs: Clear to auscultation, nonlabored breathing at rest. Cardiac: Regular rate and rhythm, no S3, 0-9/4 systolic murmur, no pericardial rub. Extremities: Chronic appearing stable leg edema/lymphedema.  ECG:  An ECG dated 09/23/2019 was personally reviewed today and demonstrated:  Sinus rhythm with prolonged PR interval and increased voltage with decreased R wave progression.  Recent Labwork: 09/23/2019: Magnesium 1.7 03/17/2020: ALT 27; AST 19 09/08/2020: BUN 93; Creatinine, Ser 3.66; Hemoglobin 12.8; Platelets 288; Potassium 4.2; Sodium 126     Component Value Date/Time   CHOL 155 02/11/2019 1026   CHOL 117  09/07/2017 0930   TRIG 150 (H) 02/11/2019 1026   HDL 32 (L) 02/11/2019 1026   HDL 24 (L) 09/07/2017 0930   CHOLHDL 4.8 02/11/2019 1026   VLDL 55 (H) 06/01/2016 1048   [place] 98 02/11/2019 1026    Other Studies Reviewed Today:  Echocardiogram 09/18/2019:  1. There is mild SAM of the anterior mitral valve leaflet in the setting  of severe symmertic LVH and hyperdynamic LV function. Dynamic LVOT  gradient of 38 mmHg. Marland Kitchen Left ventricular ejection fraction, by estimation,  is 70 to 75%. The left ventricle has  hyperdynamic function. The left ventricle  has no regional wall motion  abnormalities. There is severe left ventricular hypertrophy. Left  ventricular diastolic parameters are consistent with Grade I diastolic  dysfunction (impaired relaxation). Elevated  left atrial pressure.   2. Right ventricular systolic function is normal. The right ventricular  size is normal.   3. Left atrial size was severely dilated.   4. The mitral valve is normal in structure. Trivial mitral valve  regurgitation. Mild mitral stenosis.   5. The aortic valve is tricuspid. Aortic valve regurgitation is not  visualized. No aortic stenosis is present.   6. The inferior vena cava is normal in size with greater than 50%  respiratory variability, suggesting right atrial pressure of 3 mmHg.   Assessment and Plan:  1.  Hypertrophic cardiomyopathy with mild LVOT gradient and LVEF 70 to 75% by echocardiogram in August of last year.  She is symptomatically stable at this time without angina or syncope.  Continue combination of Lopressor and Cardizem CD.  2.  CKD stage IV with recent creatinine 3.66 and followed by Dr. Theador Hawthorne.  She has proteinuria and chronic lower extremity edema with lymphedema as well.  Currently on Demadex 50 mg twice daily and metolazone 5 mg daily in addition to potassium supplement.  Medication Adjustments/Labs and Tests Ordered: Current medicines are reviewed at length with the patient  today.  Concerns regarding medicines are outlined above.   Tests Ordered: Orders Placed This Encounter  Procedures   EKG 12-Lead    Medication Changes: Meds ordered this encounter  Medications   torsemide (DEMADEX) 20 MG tablet    Sig: Take 2.5 tablets (50 mg total) by mouth 2 (two) times daily.    Dispense:  180 tablet    Refill:  3   aspirin EC 81 MG tablet    Sig: Take 1 tablet (81 mg total) by mouth daily. Swallow whole.    Dispense:  90 tablet    Refill:  3     Disposition:  Follow up  6 months.  Signed, Satira Sark, MD, [facility] 09/13/2020 1:29 PM    [facility] [address] [place], [place] [address] Phone: [phone]; Fax: [phone]

## 2020-09-13 NOTE — Patient Instructions (Addendum)
Try to walk some for exercise  Work on getting your A1C down, that will help you feel better in general.

## 2020-09-13 NOTE — Patient Instructions (Signed)

## 2020-09-15 DIAGNOSIS — N189 Chronic kidney disease, unspecified: Secondary | ICD-10-CM | POA: Diagnosis not present

## 2020-09-15 DIAGNOSIS — E211 Secondary hyperparathyroidism, not elsewhere classified: Secondary | ICD-10-CM | POA: Diagnosis not present

## 2020-09-15 DIAGNOSIS — D631 Anemia in chronic kidney disease: Secondary | ICD-10-CM | POA: Diagnosis not present

## 2020-09-15 DIAGNOSIS — E1122 Type 2 diabetes mellitus with diabetic chronic kidney disease: Secondary | ICD-10-CM | POA: Diagnosis not present

## 2020-09-15 DIAGNOSIS — E1129 Type 2 diabetes mellitus with other diabetic kidney complication: Secondary | ICD-10-CM | POA: Diagnosis not present

## 2020-09-15 DIAGNOSIS — R809 Proteinuria, unspecified: Secondary | ICD-10-CM | POA: Diagnosis not present

## 2020-09-15 DIAGNOSIS — N170 Acute kidney failure with tubular necrosis: Secondary | ICD-10-CM | POA: Diagnosis not present

## 2020-09-15 DIAGNOSIS — I129 Hypertensive chronic kidney disease with stage 1 through stage 4 chronic kidney disease, or unspecified chronic kidney disease: Secondary | ICD-10-CM | POA: Diagnosis not present

## 2020-09-22 ENCOUNTER — Other Ambulatory Visit: Payer: Self-pay

## 2020-09-22 ENCOUNTER — Other Ambulatory Visit (HOSPITAL_COMMUNITY)
Admission: RE | Admit: 2020-09-22 | Discharge: 2020-09-22 | Disposition: A | Discharge status: Home / Self Care | Payer: HMO | Source: Ambulatory Visit | Attending: Nephrology | Admitting: Nephrology

## 2020-09-22 ENCOUNTER — Encounter (HOSPITAL_COMMUNITY)
Admission: RE | Admit: 2020-09-22 | Discharge: 2020-09-22 | Disposition: A | Discharge status: Home / Self Care | Payer: HMO | Source: Ambulatory Visit | Attending: Nephrology | Admitting: Nephrology

## 2020-09-22 DIAGNOSIS — R809 Proteinuria, unspecified: Secondary | ICD-10-CM | POA: Diagnosis not present

## 2020-09-22 DIAGNOSIS — N189 Chronic kidney disease, unspecified: Secondary | ICD-10-CM | POA: Insufficient documentation

## 2020-09-22 DIAGNOSIS — D631 Anemia in chronic kidney disease: Secondary | ICD-10-CM | POA: Insufficient documentation

## 2020-09-22 DIAGNOSIS — E211 Secondary hyperparathyroidism, not elsewhere classified: Secondary | ICD-10-CM | POA: Insufficient documentation

## 2020-09-22 DIAGNOSIS — E1129 Type 2 diabetes mellitus with other diabetic kidney complication: Secondary | ICD-10-CM | POA: Insufficient documentation

## 2020-09-22 DIAGNOSIS — I129 Hypertensive chronic kidney disease with stage 1 through stage 4 chronic kidney disease, or unspecified chronic kidney disease: Secondary | ICD-10-CM | POA: Insufficient documentation

## 2020-09-22 DIAGNOSIS — N170 Acute kidney failure with tubular necrosis: Secondary | ICD-10-CM | POA: Insufficient documentation

## 2020-09-22 DIAGNOSIS — E1122 Type 2 diabetes mellitus with diabetic chronic kidney disease: Secondary | ICD-10-CM | POA: Insufficient documentation

## 2020-09-22 LAB — CBC
HCT: 38.5 % (ref 36.0–46.0)
Hemoglobin: 12.8 g/dL (ref 12.0–15.0)
MCH: 29.3 pg (ref 26.0–34.0)
MCHC: 33.2 g/dL (ref 30.0–36.0)
MCV: 88.1 fL (ref 80.0–100.0)
Platelets: 279 10*3/uL (ref 150–400)
RBC: 4.37 MIL/uL (ref 3.87–5.11)
RDW: 13.9 % (ref 11.5–15.5)
WBC: 9.9 10*3/uL (ref 4.0–10.5)
nRBC: 0 % (ref 0.0–0.2)

## 2020-09-22 LAB — RENAL FUNCTION PANEL
Albumin: 3.4 g/dL — ABNORMAL LOW (ref 3.5–5.0)
Anion gap: 11 (ref 5–15)
BUN: 90 mg/dL — ABNORMAL HIGH (ref 8–23)
CO2: 23 mmol/L (ref 22–32)
Calcium: 9 mg/dL (ref 8.9–10.3)
Chloride: 92 mmol/L — ABNORMAL LOW (ref 98–111)
Creatinine, Ser: 3.68 mg/dL — ABNORMAL HIGH (ref 0.44–1.00)
GFR, Estimated: 13 mL/min — ABNORMAL LOW (ref >60)
Glucose, Bld: 176 mg/dL — ABNORMAL HIGH (ref 70–99)
Phosphorus: 6 mg/dL — ABNORMAL HIGH (ref 2.5–4.6)
Potassium: 4.6 mmol/L (ref 3.5–5.1)
Sodium: 126 mmol/L — ABNORMAL LOW (ref 135–145)

## 2020-09-22 LAB — FERRITIN: Ferritin: 251 ng/mL (ref 11–307)

## 2020-09-22 LAB — PROTEIN / CREATININE RATIO, URINE
Creatinine, Urine: 40.81 mg/dL
Protein Creatinine Ratio: 7.5 mg/mg{Cre} — ABNORMAL HIGH (ref 0.00–0.15)
Total Protein, Urine: 306 mg/dL

## 2020-09-22 LAB — IRON AND TIBC
Iron: 52 ug/dL (ref 28–170)
Saturation Ratios: 17 % (ref 10.4–31.8)
TIBC: 300 ug/dL (ref 250–450)
UIBC: 248 ug/dL

## 2020-09-22 LAB — VITAMIN D 25 HYDROXY (VIT D DEFICIENCY, FRACTURES): Vit D, 25-Hydroxy: 12.68 ng/mL — ABNORMAL LOW (ref 30–100)

## 2020-09-22 LAB — HEMOGLOBIN A1C
Hgb A1c MFr Bld: 7.6 % — ABNORMAL HIGH (ref 4.8–5.6)
Mean Plasma Glucose: 171.42 mg/dL

## 2020-09-23 ENCOUNTER — Other Ambulatory Visit (HOSPITAL_COMMUNITY): Payer: Self-pay | Admitting: Nephrology

## 2020-09-23 DIAGNOSIS — N2581 Secondary hyperparathyroidism of renal origin: Secondary | ICD-10-CM

## 2020-09-23 DIAGNOSIS — N189 Chronic kidney disease, unspecified: Secondary | ICD-10-CM

## 2020-09-23 DIAGNOSIS — D631 Anemia in chronic kidney disease: Secondary | ICD-10-CM

## 2020-09-23 DIAGNOSIS — N170 Acute kidney failure with tubular necrosis: Secondary | ICD-10-CM

## 2020-09-23 DIAGNOSIS — E1122 Type 2 diabetes mellitus with diabetic chronic kidney disease: Secondary | ICD-10-CM

## 2020-09-23 DIAGNOSIS — R809 Proteinuria, unspecified: Secondary | ICD-10-CM

## 2020-09-23 LAB — ANA: Anti Nuclear Antibody (ANA): NEGATIVE

## 2020-09-23 LAB — PTH, INTACT AND CALCIUM
Calcium, Total (PTH): 9.5 mg/dL (ref 8.7–10.3)
PTH: 249 pg/mL — ABNORMAL HIGH (ref 15–65)

## 2020-09-23 LAB — ANCA TITERS
Atypical P-ANCA titer: <1:20 {titer}
C-ANCA: <1:20 {titer}
P-ANCA: <1:20 {titer}

## 2020-09-24 LAB — C4 COMPLEMENT: Complement C4, Body Fluid: 52 mg/dL — ABNORMAL HIGH (ref 12–38)

## 2020-09-24 LAB — C3 COMPLEMENT: C3 Complement: 205 mg/dL — ABNORMAL HIGH (ref 82–167)

## 2020-09-29 ENCOUNTER — Ambulatory Visit (INDEPENDENT_AMBULATORY_CARE_PROVIDER_SITE_OTHER): Payer: HMO | Admitting: Endocrinology

## 2020-09-29 ENCOUNTER — Other Ambulatory Visit: Payer: Self-pay

## 2020-09-29 VITALS — BP 160/70 | HR 75 | Ht 63.0 in | Wt 179.2 lb

## 2020-09-29 DIAGNOSIS — N184 Chronic kidney disease, stage 4 (severe): Secondary | ICD-10-CM

## 2020-09-29 DIAGNOSIS — Z794 Long term (current) use of insulin: Secondary | ICD-10-CM | POA: Diagnosis not present

## 2020-09-29 DIAGNOSIS — E1122 Type 2 diabetes mellitus with diabetic chronic kidney disease: Secondary | ICD-10-CM | POA: Diagnosis not present

## 2020-09-29 LAB — POCT GLYCOSYLATED HEMOGLOBIN (HGB A1C): Hemoglobin A1C: 7.2 % — AB (ref 4.0–5.6)

## 2020-09-29 NOTE — Progress Notes (Signed)
Subjective:    Patient ID: Krista Strickland, female    DOB: [birth year], 70 y.o.   MRN: [id number]  HPI Pt returns for f/u of diabetes.   DM type: insulin-requiring type 2 Dx'ed: 0175 Complications: PN, stage 4 CRI and CVA.  Therapy: insulin since 2010 GDM: never DKA: never Severe hypoglycemia: never.   Pancreatitis: never.   SDOH: she cannot afford insulin analogs Other: she declines weight loss surgery; she had cutaneous reactions to NPH and lantus; she has declined MDI's. so we had to try 70/30 (the NPH component caused no reaction this time).  She takes 70/30, due to the pattern of cbg's; fructosamine converts to A1c 0.6 higher than A1c itself Interval history:  no cbg record, but states cbg's vary from 70-111.  She checks fasting only.  pt states she feels well in general.  She says she never misses the insulin.   Pt also has small multinodular goiter (euthyroid; f/u US in 2017 showed no change and no f/u needed; she is euthyroid off rx).   Past Medical History:  Diagnosis Date   Anemia    Arthritis    CKD (chronic kidney disease) stage 3, GFR 30-59 ml/min (HCC)    Diabetes mellitus, type 2 ([place])    Essential hypertension    GERD (gastroesophageal reflux disease)    Gout    History of MRSA infection 03/2009   History of stroke 2013   HOCM (hypertrophic obstructive cardiomyopathy) ([place])    Hypercalcemia 2017   Managed by nephrology   Hyperlipidemia    Obesity    Psoriasis    Uterine cancer ([place])    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    Past Surgical History:  Procedure Laterality Date   ABDOMINAL HYSTERECTOMY     APPENDECTOMY  2012   COLONOSCOPY WITH PROPOFOL N/A 08/24/2016   Procedure: COLONOSCOPY WITH PROPOFOL;  Surgeon: Rogene Houston, MD;  [facility];  Service: Endoscopy;  Laterality: N/A;  10:30   Excison of right breast cyst     LAPAROSCOPIC SALPINGO OOPHERECTOMY Right 04/22/2012   Procedure: LAPAROSCOPIC SALPINGO OOPHORECTOMY;  Surgeon: Jonnie Kind, MD;  Location: [facility];  Service: Gynecology;  Laterality: Right;  end 11:17   MASS EXCISION N/A 04/22/2012   Procedure: EXCISION SKIN TAGS NECK AND HEAD;  Surgeon: Jonnie Kind, MD;  Location: [facility];  Service: Gynecology;  Laterality: N/A;  start 11:19   PARTIAL HYSTERECTOMY     POLYPECTOMY  08/24/2016   Procedure: POLYPECTOMY;  Surgeon: Rogene Houston, MD;  [facility];  Service: Endoscopy;;  colon   Right neck biopsy      Social History   Socioeconomic History   Marital status: Married    Spouse name: Not on file   Number of children: 1   Years of education: Not on file   Highest education level: 11th grade  Occupational History   Occupation: disabled     Employer: UNEMPLOYED  Tobacco Use   Smoking status: Former    Packs/day: 0.25    Years: 12.00    Pack years: 3.00    Types: Cigarettes    Quit date: 07/16/1978    Years since quitting: 42.2   Smokeless tobacco: Never  Vaping Use   Vaping Use: Never used  Substance and Sexual Activity   Alcohol use: No   Drug use: No   Sexual activity: Yes    Birth control/protection: Surgical  Other Topics Concern   Not  on file  Social History Narrative   Not on file   Social Determinants of Health   Financial Resource Strain: Low Risk    Difficulty of Paying Living Expenses: Not hard at all  Food Insecurity: No Food Insecurity   Worried About Charity fundraiser in the Last Year: Never true   Arboriculturist in the Last Year: Never true  Transportation Needs: No Transportation Needs   Lack of Transportation (Medical): No   Lack of Transportation (Non-Medical): No  Physical Activity: Inactive   Days of Exercise per Week: 0 days   Minutes of Exercise per Session: 0 min  Stress: No Stress Concern Present   Feeling of Stress : Only a little  Social Connections: Socially Isolated   Frequency of Communication with Friends and Family: Never   Frequency of Social Gatherings with Friends and Family: Never    Attends Religious Services: Never   Printmaker: No   Attends Music therapist: Never   Marital Status: Married  Human resources officer Violence: Not At Risk   Fear of Current or Ex-Partner: No   Emotionally Abused: No   Physically Abused: No   Sexually Abused: No    Current Outpatient Medications on File Prior to Visit  Medication Sig Dispense Refill   allopurinol (ZYLOPRIM) 300 MG tablet Take 300 mg by mouth daily.     aspirin EC 81 MG tablet Take 1 tablet (81 mg total) by mouth daily. Swallow whole. 90 tablet 3   cyclobenzaprine (FLEXERIL) 10 MG tablet TAKE 1 TABLET BY MOUTH  EVERY NIGHT AT  BEDTIME AS NEEDED FOR SPASM 30 tablet 0   DILT-XR 180 MG 24 hr capsule Take 1 capsule by mouth once daily 90 capsule 0   docusate sodium (COLACE) 100 MG capsule Take 100 mg by mouth daily as needed for mild constipation or moderate constipation.      epoetin alfa (EPOGEN) 3000 UNIT/ML injection Inject 3,000 Units into the vein every 14 (fourteen) days.      ezetimibe (ZETIA) 10 MG tablet Take 1 tablet (10 mg total) by mouth daily. 90 tablet 3   febuxostat (ULORIC) 40 MG tablet One daily for gout 30 tablet 5   gabapentin (NEURONTIN) 600 MG tablet Take 600 mg by mouth 3 (three) times daily.     hydrALAZINE (APRESOLINE) 25 MG tablet TAKE 1 TABLET BY MOUTH TWICE DAILY (AT  9:00  AM  AND  9:00  PM) 180 tablet 0   HYDROcodone-acetaminophen (NORCO/VICODIN) 5-325 MG tablet One tablet every six hours for pain.  Limit 7 days. 28 tablet 0   insulin NPH-regular Human (NOVOLIN 70/30 RELION) (70-30) 100 UNIT/ML injection Inject 105 Units into the skin daily with breakfast. 110 mL 3   metolazone (ZAROXOLYN) 5 MG tablet Take 1 tablet by mouth once daily 90 tablet 0   metoprolol tartrate (LOPRESSOR) 100 MG tablet Take 1 tablet by mouth twice daily 180 tablet 0   ONETOUCH ULTRA test strip USE 1 STRIP TO CHECK GLUCOSE TWICE DAILY 200 each 3   pravastatin (PRAVACHOL) 20 MG tablet  TAKE 1 TABLET BY MOUTH ONCE DAILY BEFORE BREAKFAST 90 tablet 0   prednisoLONE acetate (PRED FORTE) 1 % ophthalmic suspension Place 1 drop into the left eye 2 times daily.     spironolactone (ALDACTONE) 25 MG tablet Take by mouth.     torsemide (DEMADEX) 20 MG tablet Take 2.5 tablets (50 mg total) by mouth 2 (two) times daily.  180 tablet 3   potassium chloride (KLOR-CON) 10 MEQ tablet Take 3 tablets (30 mEq total) by mouth 3 (three) times daily. 810 tablet 3   No current facility-administered medications on file prior to visit.    Allergies  Allergen Reactions   Benazepril Swelling   Metronidazole Hives   Mobic [Meloxicam] Hives   Penicillins Hives and Swelling   Sulfonamide Derivatives Hives    Family History  Problem Relation Age of Onset   Hypertension Brother    Gout Brother    Prostate cancer Brother    Hypertension Brother    Prostate cancer Brother    Gout Brother    Hypertension Sister    Gout Sister    Leukemia Sister 77   Pancreatic cancer Sister 53   Gout Sister    Prostate cancer Brother    Diabetes Neg Hx     BP (!) 160/70 (BP Location: Right Arm, Patient Position: Sitting, Cuff Size: Normal)   Pulse 75   Ht 5\' 3"  (1.6 m)   Wt 179 lb 3.2 oz (81.3 kg)   SpO2 95%   BMI 31.74 kg/m    Review of Systems     Objective:   Physical Exam Pulses: dorsalis pedis absent bilat (poss due to edema)  MSK: no deformity of the feet CV: 2+ bilat leg edema.   Skin:  no ulcer on the feet.  normal color and temp on the feet.   Neuro: sensation is intact to touch on the feet, but decreased from normal.     Lab Results  Component Value Date   HGBA1C 7.2 (A) 09/29/2020      Assessment & Plan:  Insulin-requiring type 2 DM: this is the best control this pt should aim for, given this regimen, which does match insulin to her changing needs throughout the day  Patient Instructions  Your blood pressure is high today.  Please see your kidney doctor soon, to have it  rechecked check your blood sugar twice a day.  vary the time of day when you check, between before the 3 meals, and at bedtime.  also check if you have symptoms of your blood sugar being too high or too low.  please keep a record of the readings and bring it to your next appointment here.  You can write it on any piece of paper.  please call us sooner if your blood sugar goes below 70, or if you have a lot of readings over 200.   Please continue the same insulin.  On this type of insulin schedule, you should eat meals on a regular schedule.  If a meal is missed or significantly delayed, your blood sugar could go low.   Please come back for a follow-up appointment in 3 months.

## 2020-09-29 NOTE — Patient Instructions (Addendum)
Your blood pressure is high today.  Please see your kidney doctor soon, to have it rechecked check your blood sugar twice a day.  vary the time of day when you check, between before the 3 meals, and at bedtime.  also check if you have symptoms of your blood sugar being too high or too low.  please keep a record of the readings and bring it to your next appointment here.  You can write it on any piece of paper.  please call us sooner if your blood sugar goes below 70, or if you have a lot of readings over 200.   Please continue the same insulin.  On this type of insulin schedule, you should eat meals on a regular schedule.  If a meal is missed or significantly delayed, your blood sugar could go low.   Please come back for a follow-up appointment in 3 months.

## 2020-10-06 ENCOUNTER — Other Ambulatory Visit (HOSPITAL_COMMUNITY): Payer: Self-pay | Admitting: Physician Assistant

## 2020-10-06 NOTE — H&P (Signed)
Chief Complaint: Patient was seen in consultation today for non-focal renal biopsy   Referring Physician(s): [place] S  Supervising Physician: Corrie Mckusick  Patient Status: [facility] - Out-pt  History of Present Illness: Krista Strickland is a 70 y.o. female with a medical history significant for obesity, uterine cancer, HTN, DM, stroke and chronic kidney disease stage IV. Recent lab work shows worsening renal function including proteinuria.   Interventional Radiology has been asked to evaluate this patient for an image-guided non-focal renal biopsy for further work up.   Past Medical History:  Diagnosis Date   Anemia    Arthritis    CKD (chronic kidney disease) stage 3, GFR 30-59 ml/min (HCC)    Diabetes mellitus, type 2 ([place])    Essential hypertension    GERD (gastroesophageal reflux disease)    Gout    History of MRSA infection 03/2009   History of stroke 2013   HOCM (hypertrophic obstructive cardiomyopathy) ([place])    Hypercalcemia 2017   Managed by nephrology   Hyperlipidemia    Obesity    Psoriasis    Uterine cancer ([place])    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    Past Surgical History:  Procedure Laterality Date   ABDOMINAL HYSTERECTOMY     APPENDECTOMY  2012   COLONOSCOPY WITH PROPOFOL N/A 08/24/2016   Procedure: COLONOSCOPY WITH PROPOFOL;  Surgeon: Rogene Houston, MD;  Location: [facility] SU[facility];  Service: Endoscopy;  Laterality: N/A;  10:30   Excison of right breast cyst     LAPAROSCOPIC SALPINGO OOPHERECTOMY Right 04/22/2012   Procedure: LAPAROSCOPIC SALPINGO OOPHORECTOMY;  Surgeon: Jonnie Kind, MD;  Location: [facility] ORS;  Service: Gynecology;  Laterality: Right;  end 11:17   MASS EXCISION N/A 04/22/2012   Procedure: EXCISION SKIN TAGS NECK AND HEAD;  Surgeon: Jonnie Kind, MD;  Location: [facility] ORS;  Service: Gynecology;  Laterality: N/A;  start 11:19   PARTIAL HYSTERECTOMY     POLYPECTOMY  08/24/2016   Procedure: POLYPECTOMY;  Surgeon: Rogene Houston,  MD;  Location: [facility];  Service: Endoscopy;;  colon   Right neck biopsy      Allergies: Benazepril, Metronidazole, Mobic [meloxicam], Penicillins, and Sulfonamide derivatives  Medications: Prior to Admission medications   Medication Sig Start Date End Date Taking? Authorizing Provider  allopurinol (ZYLOPRIM) 300 MG tablet Take 300 mg by mouth daily.    [provider]  aspirin EC 81 MG tablet Take 1 tablet (81 mg total) by mouth daily. Swallow whole. 09/13/20   Satira Sark, MD  cyclobenzaprine (FLEXERIL) 10 MG tablet TAKE 1 TABLET BY MOUTH  EVERY NIGHT AT  BEDTIME AS NEEDED FOR SPASM 08/15/20   Sanjuana Kava, MD  DILT-XR 180 MG 24 hr capsule Take 1 capsule by mouth once daily 09/05/20   Fayrene Helper, MD  docusate sodium (COLACE) 100 MG capsule Take 100 mg by mouth daily as needed for mild constipation or moderate constipation.     [provider]  epoetin alfa (EPOGEN) 3000 UNIT/ML injection Inject 3,000 Units into the vein every 14 (fourteen) days.     Bhutani, Manpreet S, MD  ezetimibe (ZETIA) 10 MG tablet Take 1 tablet (10 mg total) by mouth daily. 11/12/19   Fayrene Helper, MD  febuxostat (ULORIC) 40 MG tablet One daily for gout 06/14/20   Sanjuana Kava, MD  gabapentin (NEURONTIN) 600 MG tablet Take 600 mg by mouth 3 (three) times daily. 03/21/20   [provider]  hydrALAZINE (  APRESOLINE) 25 MG tablet TAKE 1 TABLET BY MOUTH TWICE DAILY (AT  9:00  AM  AND  9:00  PM) 08/15/20   Fayrene Helper, MD  HYDROcodone-acetaminophen (NORCO/VICODIN) 5-325 MG tablet One tablet every six hours for pain.  Limit 7 days. 09/13/20   Sanjuana Kava, MD  insulin NPH-regular Human (NOVOLIN 70/30 RELION) (70-30) 100 UNIT/ML injection Inject 105 Units into the skin daily with breakfast. 06/29/20   Renato Shin, MD  metolazone (ZAROXOLYN) 5 MG tablet Take 1 tablet by mouth once daily 08/15/20   Fayrene Helper, MD  metoprolol tartrate (LOPRESSOR) 100 MG tablet  Take 1 tablet by mouth twice daily 05/10/20   Fayrene Helper, MD  [facility] ULTRA test strip USE 1 STRIP TO CHECK GLUCOSE TWICE DAILY 06/20/20   Renato Shin, MD  potassium chloride (KLOR-CON) 10 MEQ tablet Take 3 tablets (30 mEq total) by mouth 3 (three) times daily. 03/04/20 09/13/20  Strader, Fransisco Hertz, PA-C  pravastatin (PRAVACHOL) 20 MG tablet TAKE 1 TABLET BY MOUTH ONCE DAILY BEFORE BREAKFAST 08/08/20   Fayrene Helper, MD  prednisoLONE acetate (PRED FORTE) 1 % ophthalmic suspension Place 1 drop into the left eye 2 times daily. 05/20/20   [provider]  spironolactone (ALDACTONE) 25 MG tablet Take by mouth. 06/23/20 06/23/21  [provider]  torsemide (DEMADEX) 20 MG tablet Take 2.5 tablets (50 mg total) by mouth 2 (two) times daily. 09/13/20   Satira Sark, MD     Family History  Problem Relation Age of Onset   Hypertension Brother    Gout Brother    Prostate cancer Brother    Hypertension Brother    Prostate cancer Brother    Gout Brother    Hypertension Sister    Gout Sister    Leukemia Sister 42   Pancreatic cancer Sister 52   Gout Sister    Prostate cancer Brother    Diabetes Neg Hx     Social History   Socioeconomic History   Marital status: Married    Spouse name: Not on file   Number of children: 1   Years of education: Not on file   Highest education level: 11th grade  Occupational History   Occupation: disabled     Fish farm manager: UNEMPLOYED  Tobacco Use   Smoking status: Former    Packs/day: 0.25    Years: 12.00    Pack years: 3.00    Types: Cigarettes    Quit date: 07/16/1978    Years since quitting: 42.2   Smokeless tobacco: Never  Vaping Use   Vaping Use: Never used  Substance and Sexual Activity   Alcohol use: No   Drug use: No   Sexual activity: Yes    Birth control/protection: Surgical  Other Topics Concern   Not on file  Social History Narrative   Not on file   Social Determinants of Health   Financial Resource  Strain: Low Risk    Difficulty of Paying Living Expenses: Not hard at all  Food Insecurity: No Food Insecurity   Worried About Charity fundraiser in the Last Year: Never true   [place] in the Last Year: Never true  Transportation Needs: No Transportation Needs   Lack of Transportation (Medical): No   Lack of Transportation (Non-Medical): No  Physical Activity: Inactive   Days of Exercise per Week: 0 days   Minutes of Exercise per Session: 0 min  Stress: No Stress Concern Present   Feeling of  Stress : Only a little  Social Connections: Socially Isolated   Frequency of Communication with Friends and Family: Never   Frequency of Social Gatherings with Friends and Family: Never   Attends Religious Services: Never   Marine scientist or Organizations: No   Attends Music therapist: Never   Marital Status: Married    Review of Systems: A 12 point ROS discussed and pertinent positives are indicated in the HPI above.  All other systems are negative.  Review of Systems  Constitutional:  Negative for appetite change and fatigue.  Respiratory:  Negative for cough and shortness of breath.   Cardiovascular:  Positive for leg swelling. Negative for chest pain.  Gastrointestinal:  Negative for abdominal pain, diarrhea, nausea and vomiting.  Neurological:  Negative for dizziness and headaches.   Vital Signs: BP (!) 175/88 (BP Location: Left Arm)   Pulse 65   Temp 98.2 F (36.8 C) (Oral)   Resp 20   SpO2 96%   Physical Exam Constitutional:      General: She is not in acute distress.    Appearance: She is obese.  HENT:     Mouth/Throat:     Mouth: Mucous membranes are moist.     Pharynx: Oropharynx is clear.  Cardiovascular:     Rate and Rhythm: Normal rate and regular rhythm.  Pulmonary:     Effort: Pulmonary effort is normal.     Breath sounds: Normal breath sounds.  Abdominal:     General: Bowel sounds are normal.     Palpations: Abdomen is soft.   Musculoskeletal:     Right lower leg: Edema present.     Left lower leg: Edema present.  Skin:    General: Skin is warm and dry.  Neurological:     Mental Status: She is alert and oriented to person, place, and time.    Imaging: No results found.  Labs:  CBC: Recent Labs    07/28/20 1209 08/11/20 1120 09/08/20 1130 09/08/20 1138 09/22/20 1312 10/07/20 0615  WBC 11.9*  --  7.8  --  9.9 11.1*  HGB 12.2   < > 12.9 12.8 12.8 13.4  HCT 38.5  --  39.4  --  38.5 41.1  PLT 272  --  288  --  279 277   < > = values in this interval not displayed.    COAGS: Recent Labs    10/07/20 0615  INR 1.0    BMP: Recent Labs    10/29/19 1417 11/12/19 1424 03/17/20 1624 04/21/20 1537 06/30/20 1359 07/14/20 1016 07/28/20 1209 09/08/20 1130 09/22/20 1312 09/22/20 1313  NA 133*   < > 138   < > 131*  --  128* 126*  --  126*  K 3.1*   < > 3.7   < > 4.2  --  4.2 4.2  --  4.6  CL 94*   < > 93*   < > 92*  --  91* 92*  --  92*  CO2 25   < > 25   < > 28  --  27 24  --  23  GLUCOSE 154*   < > 132*   < > 328*  --  259* 218*  --  176*  BUN 62*   < > 89*   < > 81*  --  75* 93*  --  90*  CALCIUM 9.8   < > 10.1   < > 10.2   < > 9.4  9.5 9.0  9.2 9.5 9.0  CREATININE 1.71*   < > 2.25*   < > 2.88*  --  2.92* 3.66*  --  3.68*  GFRNONAA 30*   < > 22*   < > 17*  --  17* 13*  --  13*  GFRAA 35*  --  25*  --   --   --   --   --   --   --    < > = values in this interval not displayed.    LIVER FUNCTION TESTS: Recent Labs    03/17/20 1624 04/21/20 1537 06/30/20 1359 07/28/20 1209 09/08/20 1130 09/22/20 1313  BILITOT <0.2  --   --   --   --   --   AST 19  --   --   --   --   --   ALT 27  --   --   --   --   --   ALKPHOS 79  --   --   --   --   --   PROT 7.1  --   --   --   --   --   ALBUMIN 4.1   < > 3.7 3.4* 3.6 3.4*   < > = values in this interval not displayed.    TUMOR MARKERS: No results for input(s): AFPTM, CEA, CA199, CHROMGRNA in the last 8760 hours.  Assessment and  Plan:  Chronic kidney disease Stage IV; proteinuria: Janina Mayo. Ouida Sills, 70 year old female, presents today to the [facility] department for an image-guided non-focal renal biopsy.  Risks and benefits of this procedure were discussed with the patient and/or patient's family including, but not limited to bleeding, infection, damage to adjacent structures or low yield requiring additional tests.  All of the questions were answered and there is agreement to proceed. She has been NPO. Blood pressure elevated this morning - patient did not take antihypertensive medication at home. PO metoprolol and hydralazine administered. See MAR.   Consent signed and in chart.  Thank you for this interesting consult.  I greatly enjoyed meeting TESSLYN BAUMERT and look forward to participating in their care.  A copy of this report was sent to the requesting provider on this date.  Electronically Signed: Soyla Dryer, AGACNP-BC [phone] 10/07/2020, 7:42 AM   I spent a total of  30 Minutes   in face to face in clinical consultation, greater than 50% of which was counseling/coordinating care for non-focal renal biopsy.

## 2020-10-07 ENCOUNTER — Encounter (HOSPITAL_COMMUNITY): Payer: Self-pay

## 2020-10-07 ENCOUNTER — Ambulatory Visit (HOSPITAL_COMMUNITY)
Admission: RE | Admit: 2020-10-07 | Discharge: 2020-10-07 | Disposition: A | Discharge status: Home / Self Care | Payer: HMO | Source: Ambulatory Visit | Attending: Nephrology | Admitting: Nephrology

## 2020-10-07 ENCOUNTER — Other Ambulatory Visit: Payer: Self-pay

## 2020-10-07 DIAGNOSIS — D631 Anemia in chronic kidney disease: Secondary | ICD-10-CM

## 2020-10-07 DIAGNOSIS — E1122 Type 2 diabetes mellitus with diabetic chronic kidney disease: Secondary | ICD-10-CM

## 2020-10-07 DIAGNOSIS — R809 Proteinuria, unspecified: Secondary | ICD-10-CM | POA: Diagnosis not present

## 2020-10-07 DIAGNOSIS — N269 Renal sclerosis, unspecified: Secondary | ICD-10-CM | POA: Insufficient documentation

## 2020-10-07 DIAGNOSIS — N184 Chronic kidney disease, stage 4 (severe): Secondary | ICD-10-CM | POA: Diagnosis not present

## 2020-10-07 DIAGNOSIS — N170 Acute kidney failure with tubular necrosis: Secondary | ICD-10-CM

## 2020-10-07 DIAGNOSIS — N2581 Secondary hyperparathyroidism of renal origin: Secondary | ICD-10-CM

## 2020-10-07 LAB — CBC
HCT: 41.1 % (ref 36.0–46.0)
Hemoglobin: 13.4 g/dL (ref 12.0–15.0)
MCH: 28.6 pg (ref 26.0–34.0)
MCHC: 32.6 g/dL (ref 30.0–36.0)
MCV: 87.6 fL (ref 80.0–100.0)
Platelets: 277 10*3/uL (ref 150–400)
RBC: 4.69 MIL/uL (ref 3.87–5.11)
RDW: 13.7 % (ref 11.5–15.5)
WBC: 11.1 10*3/uL — ABNORMAL HIGH (ref 4.0–10.5)
nRBC: 0 % (ref 0.0–0.2)

## 2020-10-07 LAB — PROTIME-INR
INR: 1 (ref 0.8–1.2)
Prothrombin Time: 13.4 seconds (ref 11.4–15.2)

## 2020-10-07 LAB — GLUCOSE, CAPILLARY: Glucose-Capillary: 132 mg/dL — ABNORMAL HIGH (ref 70–99)

## 2020-10-07 MED ORDER — LIDOCAINE HCL (PF) 1 % IJ SOLN
INTRAMUSCULAR | Status: AC
  Filled 2020-10-07: qty 30

## 2020-10-07 MED ORDER — FENTANYL CITRATE (PF) 100 MCG/2ML IJ SOLN
INTRAMUSCULAR | Status: AC
  Filled 2020-10-07: qty 2

## 2020-10-07 MED ORDER — HYDRALAZINE HCL 25 MG PO TABS
25.0000 mg | ORAL_TABLET | ORAL | Status: AC
  Administered 2020-10-07: 25 mg via ORAL
  Filled 2020-10-07: qty 1

## 2020-10-07 MED ORDER — MIDAZOLAM HCL 2 MG/2ML IJ SOLN
INTRAMUSCULAR | Status: DC | PRN
  Administered 2020-10-07: 1 mg via INTRAVENOUS

## 2020-10-07 MED ORDER — METOPROLOL TARTRATE 50 MG PO TABS
ORAL_TABLET | ORAL | Status: AC
  Filled 2020-10-07: qty 1

## 2020-10-07 MED ORDER — HYDRALAZINE HCL 20 MG/ML IJ SOLN
INTRAMUSCULAR | Status: AC
  Filled 2020-10-07: qty 1

## 2020-10-07 MED ORDER — FENTANYL CITRATE (PF) 100 MCG/2ML IJ SOLN
INTRAMUSCULAR | Status: DC | PRN
  Administered 2020-10-07: 50 ug via INTRAVENOUS

## 2020-10-07 MED ORDER — METOPROLOL TARTRATE 50 MG PO TABS
50.0000 mg | ORAL_TABLET | Freq: Once | ORAL | Status: AC
  Administered 2020-10-07: 50 mg via ORAL

## 2020-10-07 MED ORDER — SODIUM CHLORIDE 0.9 % IV SOLN: INTRAVENOUS | Status: DC

## 2020-10-07 MED ORDER — GELATIN ABSORBABLE 12-7 MM EX MISC
CUTANEOUS | Status: AC
  Filled 2020-10-07: qty 1

## 2020-10-07 MED ORDER — MIDAZOLAM HCL 2 MG/2ML IJ SOLN
INTRAMUSCULAR | Status: AC
  Filled 2020-10-07: qty 2

## 2020-10-07 NOTE — Sedation Documentation (Signed)
Vital signs stable. 

## 2020-10-07 NOTE — Sedation Documentation (Signed)
Patient is resting comfortably. Procedure started °

## 2020-10-07 NOTE — Procedures (Signed)
Interventional Radiology Procedure Note  Procedure: US guided biopsy of left kidney, medical renal Complications: None EBL: None Recommendations: - Bedrest 2 hours.   - Routine wound care - Follow up pathology - Advance diet   Signed,  Melysa Schroyer, DO   

## 2020-10-07 NOTE — Sedation Documentation (Signed)
Transported pt to [place] room for procedure. Pt is awake alert and oriented x4, npo, consent signed. BP meds given per order. Assisted pt to prone position. Pt states she is somewhat uncomfortable given she does not normally lay on her stomach (prone position). Sympathized with pt. Vitals stable. Pt being prepped for biopsy at this time. Pt has no other complaints.

## 2020-10-07 NOTE — Sedation Documentation (Signed)
Adhesive bandage to left lower back, cdi, unremarkable.

## 2020-10-07 NOTE — Sedation Documentation (Signed)
Pt tolerated procedure very well.   Totals: Time-10 mins Fentanyl 50 mcg Versed 1mg 

## 2020-10-07 NOTE — Sedation Documentation (Signed)
Report given to Shon Millet RN. Pt remains stable, asleep and resting. Vitals stable.

## 2020-10-11 ENCOUNTER — Inpatient Hospital Stay (HOSPITAL_COMMUNITY)
Admission: EM | Admit: 2020-10-11 | Discharge: 2020-11-29 | DRG: 919 | Disposition: E | Payer: HMO | Attending: Pulmonary Disease | Admitting: Pulmonary Disease

## 2020-10-11 ENCOUNTER — Encounter (HOSPITAL_COMMUNITY): Payer: Self-pay | Admitting: *Deleted

## 2020-10-11 ENCOUNTER — Other Ambulatory Visit: Payer: Self-pay

## 2020-10-11 ENCOUNTER — Emergency Department (HOSPITAL_COMMUNITY): Payer: HMO

## 2020-10-11 DIAGNOSIS — J9690 Respiratory failure, unspecified, unspecified whether with hypoxia or hypercapnia: Secondary | ICD-10-CM

## 2020-10-11 DIAGNOSIS — E871 Hypo-osmolality and hyponatremia: Secondary | ICD-10-CM

## 2020-10-11 DIAGNOSIS — R109 Unspecified abdominal pain: Secondary | ICD-10-CM | POA: Diagnosis present

## 2020-10-11 DIAGNOSIS — I422 Other hypertrophic cardiomyopathy: Secondary | ICD-10-CM | POA: Diagnosis not present

## 2020-10-11 DIAGNOSIS — G9341 Metabolic encephalopathy: Secondary | ICD-10-CM | POA: Diagnosis not present

## 2020-10-11 DIAGNOSIS — S37022 Major contusion of left kidney: Secondary | ICD-10-CM | POA: Diagnosis not present

## 2020-10-11 DIAGNOSIS — Z8614 Personal history of Methicillin resistant Staphylococcus aureus infection: Secondary | ICD-10-CM

## 2020-10-11 DIAGNOSIS — Z4682 Encounter for fitting and adjustment of non-vascular catheter: Secondary | ICD-10-CM | POA: Diagnosis not present

## 2020-10-11 DIAGNOSIS — K219 Gastro-esophageal reflux disease without esophagitis: Secondary | ICD-10-CM | POA: Insufficient documentation

## 2020-10-11 DIAGNOSIS — Z515 Encounter for palliative care: Secondary | ICD-10-CM | POA: Diagnosis not present

## 2020-10-11 DIAGNOSIS — Z20822 Contact with and (suspected) exposure to covid-19: Secondary | ICD-10-CM | POA: Diagnosis present

## 2020-10-11 DIAGNOSIS — E46 Unspecified protein-calorie malnutrition: Secondary | ICD-10-CM | POA: Diagnosis not present

## 2020-10-11 DIAGNOSIS — J690 Pneumonitis due to inhalation of food and vomit: Secondary | ICD-10-CM | POA: Diagnosis not present

## 2020-10-11 DIAGNOSIS — E8770 Fluid overload, unspecified: Secondary | ICD-10-CM | POA: Diagnosis not present

## 2020-10-11 DIAGNOSIS — N170 Acute kidney failure with tubular necrosis: Secondary | ICD-10-CM | POA: Diagnosis not present

## 2020-10-11 DIAGNOSIS — R1030 Lower abdominal pain, unspecified: Secondary | ICD-10-CM

## 2020-10-11 DIAGNOSIS — G4730 Sleep apnea, unspecified: Secondary | ICD-10-CM | POA: Diagnosis present

## 2020-10-11 DIAGNOSIS — Z992 Dependence on renal dialysis: Secondary | ICD-10-CM

## 2020-10-11 DIAGNOSIS — R339 Retention of urine, unspecified: Secondary | ICD-10-CM | POA: Diagnosis present

## 2020-10-11 DIAGNOSIS — A419 Sepsis, unspecified organism: Secondary | ICD-10-CM | POA: Diagnosis not present

## 2020-10-11 DIAGNOSIS — E1140 Type 2 diabetes mellitus with diabetic neuropathy, unspecified: Secondary | ICD-10-CM | POA: Diagnosis present

## 2020-10-11 DIAGNOSIS — I248 Other forms of acute ischemic heart disease: Secondary | ICD-10-CM | POA: Diagnosis not present

## 2020-10-11 DIAGNOSIS — Z6831 Body mass index (BMI) 31.0-31.9, adult: Secondary | ICD-10-CM

## 2020-10-11 DIAGNOSIS — I1311 Hypertensive heart and chronic kidney disease without heart failure, with stage 5 chronic kidney disease, or end stage renal disease: Secondary | ICD-10-CM | POA: Diagnosis present

## 2020-10-11 DIAGNOSIS — Z86010 Personal history of colonic polyps: Secondary | ICD-10-CM

## 2020-10-11 DIAGNOSIS — H5462 Unqualified visual loss, left eye, normal vision right eye: Secondary | ICD-10-CM | POA: Diagnosis present

## 2020-10-11 DIAGNOSIS — I10 Essential (primary) hypertension: Secondary | ICD-10-CM | POA: Diagnosis present

## 2020-10-11 DIAGNOSIS — I469 Cardiac arrest, cause unspecified: Secondary | ICD-10-CM | POA: Diagnosis not present

## 2020-10-11 DIAGNOSIS — E1122 Type 2 diabetes mellitus with diabetic chronic kidney disease: Secondary | ICD-10-CM | POA: Diagnosis present

## 2020-10-11 DIAGNOSIS — I440 Atrioventricular block, first degree: Secondary | ICD-10-CM | POA: Diagnosis present

## 2020-10-11 DIAGNOSIS — S37022A Major contusion of left kidney, initial encounter: Secondary | ICD-10-CM | POA: Diagnosis not present

## 2020-10-11 DIAGNOSIS — S37002 Unspecified injury of left kidney: Secondary | ICD-10-CM | POA: Diagnosis not present

## 2020-10-11 DIAGNOSIS — J9811 Atelectasis: Secondary | ICD-10-CM | POA: Diagnosis not present

## 2020-10-11 DIAGNOSIS — I517 Cardiomegaly: Secondary | ICD-10-CM | POA: Diagnosis not present

## 2020-10-11 DIAGNOSIS — G319 Degenerative disease of nervous system, unspecified: Secondary | ICD-10-CM | POA: Diagnosis not present

## 2020-10-11 DIAGNOSIS — E875 Hyperkalemia: Secondary | ICD-10-CM | POA: Diagnosis not present

## 2020-10-11 DIAGNOSIS — R079 Chest pain, unspecified: Secondary | ICD-10-CM

## 2020-10-11 DIAGNOSIS — Z66 Do not resuscitate: Secondary | ICD-10-CM | POA: Diagnosis not present

## 2020-10-11 DIAGNOSIS — I421 Obstructive hypertrophic cardiomyopathy: Secondary | ICD-10-CM | POA: Diagnosis not present

## 2020-10-11 DIAGNOSIS — Z8249 Family history of ischemic heart disease and other diseases of the circulatory system: Secondary | ICD-10-CM

## 2020-10-11 DIAGNOSIS — Z794 Long term (current) use of insulin: Secondary | ICD-10-CM

## 2020-10-11 DIAGNOSIS — R918 Other nonspecific abnormal finding of lung field: Secondary | ICD-10-CM | POA: Diagnosis not present

## 2020-10-11 DIAGNOSIS — Z978 Presence of other specified devices: Secondary | ICD-10-CM | POA: Diagnosis not present

## 2020-10-11 DIAGNOSIS — E66811 Obesity, class 1: Secondary | ICD-10-CM | POA: Diagnosis present

## 2020-10-11 DIAGNOSIS — J90 Pleural effusion, not elsewhere classified: Secondary | ICD-10-CM | POA: Diagnosis present

## 2020-10-11 DIAGNOSIS — S37012A Minor contusion of left kidney, initial encounter: Secondary | ICD-10-CM | POA: Diagnosis not present

## 2020-10-11 DIAGNOSIS — J811 Chronic pulmonary edema: Secondary | ICD-10-CM | POA: Diagnosis not present

## 2020-10-11 DIAGNOSIS — N189 Chronic kidney disease, unspecified: Secondary | ICD-10-CM | POA: Diagnosis not present

## 2020-10-11 DIAGNOSIS — Z4659 Encounter for fitting and adjustment of other gastrointestinal appliance and device: Secondary | ICD-10-CM | POA: Diagnosis not present

## 2020-10-11 DIAGNOSIS — Z79899 Other long term (current) drug therapy: Secondary | ICD-10-CM

## 2020-10-11 DIAGNOSIS — N99840 Postprocedural hematoma of a genitourinary system organ or structure following a genitourinary system procedure: Secondary | ICD-10-CM | POA: Diagnosis not present

## 2020-10-11 DIAGNOSIS — J9602 Acute respiratory failure with hypercapnia: Secondary | ICD-10-CM | POA: Diagnosis not present

## 2020-10-11 DIAGNOSIS — K5730 Diverticulosis of large intestine without perforation or abscess without bleeding: Secondary | ICD-10-CM | POA: Diagnosis not present

## 2020-10-11 DIAGNOSIS — Z452 Encounter for adjustment and management of vascular access device: Secondary | ICD-10-CM

## 2020-10-11 DIAGNOSIS — D6959 Other secondary thrombocytopenia: Secondary | ICD-10-CM | POA: Diagnosis not present

## 2020-10-11 DIAGNOSIS — R6521 Severe sepsis with septic shock: Secondary | ICD-10-CM | POA: Diagnosis not present

## 2020-10-11 DIAGNOSIS — I69398 Other sequelae of cerebral infarction: Secondary | ICD-10-CM

## 2020-10-11 DIAGNOSIS — Z01818 Encounter for other preprocedural examination: Secondary | ICD-10-CM

## 2020-10-11 DIAGNOSIS — H5461 Unqualified visual loss, right eye, normal vision left eye: Secondary | ICD-10-CM | POA: Diagnosis present

## 2020-10-11 DIAGNOSIS — M1990 Unspecified osteoarthritis, unspecified site: Secondary | ICD-10-CM | POA: Diagnosis present

## 2020-10-11 DIAGNOSIS — E1169 Type 2 diabetes mellitus with other specified complication: Secondary | ICD-10-CM | POA: Diagnosis not present

## 2020-10-11 DIAGNOSIS — R0902 Hypoxemia: Secondary | ICD-10-CM | POA: Diagnosis not present

## 2020-10-11 DIAGNOSIS — Z7982 Long term (current) use of aspirin: Secondary | ICD-10-CM

## 2020-10-11 DIAGNOSIS — G931 Anoxic brain damage, not elsewhere classified: Secondary | ICD-10-CM | POA: Diagnosis not present

## 2020-10-11 DIAGNOSIS — R680 Hypothermia, not associated with low environmental temperature: Secondary | ICD-10-CM | POA: Diagnosis not present

## 2020-10-11 DIAGNOSIS — Z880 Allergy status to penicillin: Secondary | ICD-10-CM

## 2020-10-11 DIAGNOSIS — M109 Gout, unspecified: Secondary | ICD-10-CM | POA: Diagnosis present

## 2020-10-11 DIAGNOSIS — Z882 Allergy status to sulfonamides status: Secondary | ICD-10-CM

## 2020-10-11 DIAGNOSIS — Z90710 Acquired absence of both cervix and uterus: Secondary | ICD-10-CM

## 2020-10-11 DIAGNOSIS — Z9049 Acquired absence of other specified parts of digestive tract: Secondary | ICD-10-CM

## 2020-10-11 DIAGNOSIS — I129 Hypertensive chronic kidney disease with stage 1 through stage 4 chronic kidney disease, or unspecified chronic kidney disease: Secondary | ICD-10-CM | POA: Diagnosis not present

## 2020-10-11 DIAGNOSIS — Z9911 Dependence on respirator [ventilator] status: Secondary | ICD-10-CM

## 2020-10-11 DIAGNOSIS — Z7189 Other specified counseling: Secondary | ICD-10-CM | POA: Diagnosis not present

## 2020-10-11 DIAGNOSIS — Z8542 Personal history of malignant neoplasm of other parts of uterus: Secondary | ICD-10-CM

## 2020-10-11 DIAGNOSIS — I700 Atherosclerosis of aorta: Secondary | ICD-10-CM | POA: Diagnosis present

## 2020-10-11 DIAGNOSIS — E669 Obesity, unspecified: Secondary | ICD-10-CM | POA: Diagnosis present

## 2020-10-11 DIAGNOSIS — R011 Cardiac murmur, unspecified: Secondary | ICD-10-CM | POA: Diagnosis present

## 2020-10-11 DIAGNOSIS — R34 Anuria and oliguria: Secondary | ICD-10-CM | POA: Diagnosis not present

## 2020-10-11 DIAGNOSIS — I2699 Other pulmonary embolism without acute cor pulmonale: Secondary | ICD-10-CM | POA: Diagnosis present

## 2020-10-11 DIAGNOSIS — R579 Shock, unspecified: Secondary | ICD-10-CM | POA: Diagnosis not present

## 2020-10-11 DIAGNOSIS — Z888 Allergy status to other drugs, medicaments and biological substances status: Secondary | ICD-10-CM

## 2020-10-11 DIAGNOSIS — N181 Chronic kidney disease, stage 1: Secondary | ICD-10-CM | POA: Diagnosis not present

## 2020-10-11 DIAGNOSIS — D631 Anemia in chronic kidney disease: Secondary | ICD-10-CM | POA: Diagnosis present

## 2020-10-11 DIAGNOSIS — J189 Pneumonia, unspecified organism: Secondary | ICD-10-CM | POA: Diagnosis not present

## 2020-10-11 DIAGNOSIS — J984 Other disorders of lung: Secondary | ICD-10-CM | POA: Diagnosis not present

## 2020-10-11 DIAGNOSIS — E782 Mixed hyperlipidemia: Secondary | ICD-10-CM | POA: Diagnosis not present

## 2020-10-11 DIAGNOSIS — I5030 Unspecified diastolic (congestive) heart failure: Secondary | ICD-10-CM | POA: Diagnosis not present

## 2020-10-11 DIAGNOSIS — N185 Chronic kidney disease, stage 5: Secondary | ICD-10-CM | POA: Diagnosis present

## 2020-10-11 DIAGNOSIS — I4891 Unspecified atrial fibrillation: Secondary | ICD-10-CM | POA: Diagnosis not present

## 2020-10-11 DIAGNOSIS — Y848 Other medical procedures as the cause of abnormal reaction of the patient, or of later complication, without mention of misadventure at the time of the procedure: Secondary | ICD-10-CM | POA: Diagnosis present

## 2020-10-11 DIAGNOSIS — E8809 Other disorders of plasma-protein metabolism, not elsewhere classified: Secondary | ICD-10-CM | POA: Diagnosis present

## 2020-10-11 DIAGNOSIS — D72829 Elevated white blood cell count, unspecified: Secondary | ICD-10-CM | POA: Diagnosis not present

## 2020-10-11 DIAGNOSIS — D500 Iron deficiency anemia secondary to blood loss (chronic): Secondary | ICD-10-CM | POA: Diagnosis not present

## 2020-10-11 DIAGNOSIS — J9601 Acute respiratory failure with hypoxia: Secondary | ICD-10-CM | POA: Diagnosis not present

## 2020-10-11 DIAGNOSIS — R569 Unspecified convulsions: Secondary | ICD-10-CM | POA: Diagnosis not present

## 2020-10-11 DIAGNOSIS — Z87891 Personal history of nicotine dependence: Secondary | ICD-10-CM

## 2020-10-11 DIAGNOSIS — E440 Moderate protein-calorie malnutrition: Secondary | ICD-10-CM | POA: Diagnosis present

## 2020-10-11 DIAGNOSIS — N184 Chronic kidney disease, stage 4 (severe): Secondary | ICD-10-CM | POA: Diagnosis not present

## 2020-10-11 DIAGNOSIS — Z800 Family history of malignant neoplasm of digestive organs: Secondary | ICD-10-CM

## 2020-10-11 DIAGNOSIS — R739 Hyperglycemia, unspecified: Secondary | ICD-10-CM | POA: Diagnosis not present

## 2020-10-11 DIAGNOSIS — S0990XA Unspecified injury of head, initial encounter: Secondary | ICD-10-CM | POA: Diagnosis not present

## 2020-10-11 DIAGNOSIS — S301XXA Contusion of abdominal wall, initial encounter: Secondary | ICD-10-CM | POA: Diagnosis not present

## 2020-10-11 DIAGNOSIS — N179 Acute kidney failure, unspecified: Secondary | ICD-10-CM

## 2020-10-11 DIAGNOSIS — R570 Cardiogenic shock: Secondary | ICD-10-CM | POA: Diagnosis not present

## 2020-10-11 DIAGNOSIS — N2889 Other specified disorders of kidney and ureter: Secondary | ICD-10-CM | POA: Diagnosis not present

## 2020-10-11 DIAGNOSIS — Z90711 Acquired absence of uterus with remaining cervical stump: Secondary | ICD-10-CM

## 2020-10-11 DIAGNOSIS — D62 Acute posthemorrhagic anemia: Secondary | ICD-10-CM | POA: Diagnosis present

## 2020-10-11 DIAGNOSIS — E1165 Type 2 diabetes mellitus with hyperglycemia: Secondary | ICD-10-CM

## 2020-10-11 DIAGNOSIS — E860 Dehydration: Secondary | ICD-10-CM | POA: Diagnosis present

## 2020-10-11 DIAGNOSIS — N2581 Secondary hyperparathyroidism of renal origin: Secondary | ICD-10-CM | POA: Diagnosis present

## 2020-10-11 DIAGNOSIS — J810 Acute pulmonary edema: Secondary | ICD-10-CM | POA: Diagnosis present

## 2020-10-11 LAB — CBC WITH DIFFERENTIAL/PLATELET
Abs Immature Granulocytes: 0.11 10*3/uL — ABNORMAL HIGH (ref 0.00–0.07)
Basophils Absolute: 0.1 10*3/uL (ref 0.0–0.1)
Basophils Relative: 1 %
Eosinophils Absolute: 0.1 10*3/uL (ref 0.0–0.5)
Eosinophils Relative: 0 %
HCT: 35.7 % — ABNORMAL LOW (ref 36.0–46.0)
Hemoglobin: 11.8 g/dL — ABNORMAL LOW (ref 12.0–15.0)
Immature Granulocytes: 1 %
Lymphocytes Relative: 8 %
Lymphs Abs: 1.2 10*3/uL (ref 0.7–4.0)
MCH: 29.1 pg (ref 26.0–34.0)
MCHC: 33.1 g/dL (ref 30.0–36.0)
MCV: 88.1 fL (ref 80.0–100.0)
Monocytes Absolute: 1.3 10*3/uL — ABNORMAL HIGH (ref 0.1–1.0)
Monocytes Relative: 8 %
Neutro Abs: 12.7 10*3/uL — ABNORMAL HIGH (ref 1.7–7.7)
Neutrophils Relative %: 82 %
Platelets: 270 10*3/uL (ref 150–400)
RBC: 4.05 MIL/uL (ref 3.87–5.11)
RDW: 13.6 % (ref 11.5–15.5)
WBC: 15.4 10*3/uL — ABNORMAL HIGH (ref 4.0–10.5)
nRBC: 0 % (ref 0.0–0.2)

## 2020-10-11 LAB — COMPREHENSIVE METABOLIC PANEL
ALT: 43 U/L (ref 0–44)
AST: 39 U/L (ref 15–41)
Albumin: 3.3 g/dL — ABNORMAL LOW (ref 3.5–5.0)
Alkaline Phosphatase: 82 U/L (ref 38–126)
Anion gap: 12 (ref 5–15)
BUN: 93 mg/dL — ABNORMAL HIGH (ref 8–23)
CO2: 21 mmol/L — ABNORMAL LOW (ref 22–32)
Calcium: 9.2 mg/dL (ref 8.9–10.3)
Chloride: 94 mmol/L — ABNORMAL LOW (ref 98–111)
Creatinine, Ser: 4.48 mg/dL — ABNORMAL HIGH (ref 0.44–1.00)
GFR, Estimated: 10 mL/min — ABNORMAL LOW (ref >60)
Glucose, Bld: 139 mg/dL — ABNORMAL HIGH (ref 70–99)
Potassium: 4.8 mmol/L (ref 3.5–5.1)
Sodium: 127 mmol/L — ABNORMAL LOW (ref 135–145)
Total Bilirubin: 0.4 mg/dL (ref 0.3–1.2)
Total Protein: 7 g/dL (ref 6.5–8.1)

## 2020-10-11 LAB — RESP PANEL BY RT-PCR (FLU A&B, COVID) ARPGX2
Influenza A by PCR: NEGATIVE
Influenza B by PCR: NEGATIVE
SARS Coronavirus 2 by RT PCR: NEGATIVE

## 2020-10-11 LAB — CBG MONITORING, ED: Glucose-Capillary: 129 mg/dL — ABNORMAL HIGH (ref 70–99)

## 2020-10-11 LAB — LIPASE, BLOOD: Lipase: 37 U/L (ref 11–51)

## 2020-10-11 MED ORDER — HYDROMORPHONE HCL 1 MG/ML IJ SOLN
0.5000 mg | INTRAMUSCULAR | Status: DC | PRN
  Administered 2020-10-12: 0.5 mg via INTRAVENOUS
  Filled 2020-10-11: qty 1

## 2020-10-11 NOTE — H&P (Signed)
History and Physical  Krista Strickland QMG:[id number] DOB: 07-12-1950 DOA: 10/05/2020  Referring physician: Daleen Bo, MD PCP: Fayrene Helper, MD  Patient coming from: Home  Chief Complaint: Abdominal pain  HPI: Krista Strickland is a 70 y.o. female with medical history significant for CKD stage IV, hypertension, type 2 diabetes mellitus, GERD, hyperlipidemia, HOCM who presents to the emergency department due to abdominal pain which started around 1 PM today.  Pain was sharp in nature, it was constant and was rated as 9/10 on pain scale.  Pain was located left lower quadrant.  She endorsed one loose bowel movement today and was not sure if this was related to eating a larger than usual breakfast around 11 AM this morning.  She recently had renal biopsy on 9/9.  She follows with Dr. Theador Hawthorne due to CKD and was last seen on 8/18.  ED Course:  In the emergency department, BP was 171/89, other vital signs are within normal range.  Work-up in the ED shows leukocytosis, hyponatremia,, BUN to creatinine 93/4.48, hyperglycemia, albumin 3.3.  Influenza A, B, SARS coronavirus 2 was negative.   CT abdomen and pelvis without contrast showed acute 8 x 4 x 12 cm left renal subcapsular hematoma.  Interventional radiologist was consulted and recommended monitoring patient for 24 to 48 hours and to consider arteriogram for any worsening symptoms per ED physician.  Hospitalist was asked to admit patient for further evaluation and management.  Review of Systems: Constitutional: Negative for chills and fever.  HENT: Negative for ear pain and sore throat.   Eyes: Negative for pain and visual disturbance.  Respiratory: Negative for cough, chest tightness and shortness of breath.   Cardiovascular: Negative for chest pain and palpitations.  Gastrointestinal: Positive for abdominal pain and negative for vomiting.  Endocrine: Negative for polyphagia and polyuria.  Genitourinary: Negative for decreased urine  volume, dysuria, enuresis Musculoskeletal: Positive for leg swelling bilaterally.  Negative for arthralgias and back pain.  Skin: Negative for color change and rash.  Allergic/Immunologic: Negative for immunocompromised state.  Neurological: Negative for tremors, syncope, speech difficulty Hematological: Does not bruise/bleed easily.  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   Past Surgical History:  Procedure Laterality Date   ABDOMINAL HYSTERECTOMY     APPENDECTOMY  2012   COLONOSCOPY WITH PROPOFOL N/A 08/24/2016   Procedure: COLONOSCOPY WITH PROPOFOL;  Surgeon: Rogene Houston, MD;  Location: [facility];  Service: Endoscopy;  Laterality: N/A;  10:30   Excison of right breast cyst     LAPAROSCOPIC SALPINGO OOPHERECTOMY Right 04/22/2012   Procedure: LAPAROSCOPIC SALPINGO OOPHORECTOMY;  Surgeon: Jonnie Kind, MD;  Location: [facility];  Service: Gynecology;  Laterality: Right;  end 11:17   MASS EXCISION N/A 04/22/2012   Procedure: EXCISION SKIN TAGS NECK AND HEAD;  Surgeon: Jonnie Kind, MD;  Location: [facility];  Service: Gynecology;  Laterality: N/A;  start 11:19   PARTIAL HYSTERECTOMY     POLYPECTOMY  08/24/2016   Procedure: POLYPECTOMY;  Surgeon: Rogene Houston, MD;  Location: [facility];  Service: Endoscopy;;  colon   Right neck biopsy      Social History:  reports that she quit smoking about 42 years ago. She has a 3.00 pack-year smoking history. She has never used  smokeless tobacco. She reports that she does not drink alcohol and does not use drugs.   Allergies  Allergen Reactions   Benazepril Swelling   Metronidazole Hives   Mobic [Meloxicam] Hives   Penicillins Hives and Swelling   Sulfonamide Derivatives Hives    Family History  Problem Relation Age of Onset   Hypertension Brother    Gout Brother    Prostate cancer Brother    Hypertension Brother    Prostate cancer Brother    Gout Brother    Hypertension Sister    Gout Sister    Leukemia Sister 89   Pancreatic cancer Sister 104   Gout Sister    Prostate cancer Brother    Diabetes Neg Hx      Prior to Admission medications   Medication Sig Start Date End Date Taking? Authorizing Provider  aspirin EC 81 MG tablet Take 1 tablet (81 mg total) by mouth daily. Swallow whole. 09/13/20   Satira Sark, MD  calcitRIOL (ROCALTROL) 0.25 MCG capsule Take 0.25 mcg by mouth daily.    [provider]  cyclobenzaprine (FLEXERIL) 10 MG tablet TAKE 1 TABLET BY MOUTH  EVERY NIGHT AT  BEDTIME AS NEEDED FOR SPASM Patient not taking: Reported on 10/06/2020 08/15/20   Sanjuana Kava, MD  DILT-XR 180 MG 24 hr capsule Take 1 capsule by mouth once daily 09/05/20   Fayrene Helper, MD  epoetin alfa (EPOGEN) 3000 UNIT/ML injection Inject 3,000 Units into the vein every 14 (fourteen) days.     Bhutani, Manpreet S, MD  ezetimibe (ZETIA) 10 MG tablet Take 1 tablet (10 mg total) by mouth daily. 11/12/19   Fayrene Helper, MD  febuxostat (ULORIC) 40 MG tablet One daily for gout Patient taking differently: Take 40 mg by mouth daily. One daily for gout 06/14/20   Sanjuana Kava, MD  gabapentin (NEURONTIN) 600 MG tablet Take 300 mg by mouth 2 (two) times daily. 03/21/20   [provider]  hydrALAZINE (APRESOLINE) 25 MG tablet TAKE 1 TABLET BY MOUTH TWICE DAILY (AT  9:00  AM  AND  9:00  PM) 08/15/20   Fayrene Helper, MD  HYDROcodone-acetaminophen (NORCO/VICODIN) 5-325 MG tablet One tablet  every six hours for pain.  Limit 7 days. Patient taking differently: Take 1 tablet by mouth every 6 (six) hours as needed for severe pain. Limit 7 days. 09/13/20   Sanjuana Kava, MD  insulin NPH-regular Human (NOVOLIN 70/30 RELION) (70-30) 100 UNIT/ML injection Inject 105 Units into the skin daily with breakfast. 06/29/20   Renato Shin, MD  metolazone (ZAROXOLYN) 5 MG tablet Take 1 tablet by mouth once daily 08/15/20   Fayrene Helper, MD  metoprolol tartrate (LOPRESSOR) 100 MG tablet Take 1 tablet by mouth twice daily Patient taking differently: Take 50 mg by mouth 2 (two) times daily. 05/10/20   Fayrene Helper, MD  [facility] ULTRA test strip USE 1 STRIP TO CHECK GLUCOSE TWICE DAILY 06/20/20   Renato Shin, MD  potassium chloride (KLOR-CON) 10 MEQ tablet Take 30 mEq by mouth 2 (two) times daily.  [provider]  pravastatin (PRAVACHOL) 20 MG tablet TAKE 1 TABLET BY MOUTH ONCE DAILY BEFORE BREAKFAST 08/08/20   Fayrene Helper, MD  prednisoLONE acetate (PRED FORTE) 1 % ophthalmic suspension Place 1 drop into the left eye in the morning and at bedtime. 05/20/20   [provider]  spironolactone (ALDACTONE) 25 MG tablet Take 75 mg by mouth daily. 06/23/20 06/23/21  [provider]  torsemide (DEMADEX) 20 MG tablet Take 2.5 tablets (50 mg total) by mouth 2 (two) times daily. Patient not taking: Reported on 10/06/2020 09/13/20   Satira Sark, MD    Physical Exam: BP (!) 168/78   Pulse 76   Temp 98.8 F (37.1 C) (Oral)   Resp 20   SpO2 97%   General: 70 y.o. year-old female well developed well nourished in no acute distress.  Alert and oriented x3. HEENT: Dry mucous membrane.  NCAT, EOMI Neck: Supple, trachea medial Cardiovascular: Regular rate and rhythm with no rubs or gallops.  No thyromegaly or JVD noted.  No lower extremity edema. 2/4 pulses in all 4 extremities. Respiratory: Clear to auscultation with no wheezes or rales. Good inspiratory  effort. Abdomen: Soft, tender to palpation in LLQ without guarding.  Nondistended with normal bowel sounds x4 quadrants. Muskuloskeletal: No CVA tenderness.  B/L LE edema. No cyanosis or clubbing noted bilaterally Neuro: CN II-XII intact, strength 5/5 x 4, sensation, reflexes intact Skin: No ulcerative lesions noted or rashes Psychiatry: Mood is appropriate for condition and setting          Labs on Admission:  Basic Metabolic Panel: Recent Labs  Lab 10/24/2020 1738  NA 127*  K 4.8  CL 94*  CO2 21*  GLUCOSE 139*  BUN 93*  CREATININE 4.48*  CALCIUM 9.2   Liver Function Tests: Recent Labs  Lab 10/12/2020 1738  AST 39  ALT 43  ALKPHOS 82  BILITOT 0.4  PROT 7.0  ALBUMIN 3.3*   Recent Labs  Lab 10/14/2020 1738  LIPASE 37   No results for input(s): AMMONIA in the last 168 hours. CBC: Recent Labs  Lab 10/07/20 0615 10/15/2020 1738  WBC 11.1* 15.4*  NEUTROABS  --  12.7*  HGB 13.4 11.8*  HCT 41.1 35.7*  MCV 87.6 88.1  PLT 277 270   Cardiac Enzymes: No results for input(s): CKTOTAL, CKMB, CKMBINDEX, TROPONINI in the last 168 hours.  BNP (last 3 results) No results for input(s): BNP in the last 8760 hours.  ProBNP (last 3 results) No results for input(s): PROBNP in the last 8760 hours.  CBG: Recent Labs  Lab 10/07/20 0614 10/02/2020 1710 10/12/20 0026  GLUCAP 132* 129* 76    Radiological Exams on Admission: CT Abdomen Pelvis Wo Contrast  Result Date: 09/29/2020 CLINICAL DATA:  abdominal pain that began today Pt had renal biopsy 3 days ago EXAM: CT ABDOMEN AND PELVIS WITHOUT CONTRAST TECHNIQUE: Multidetector CT imaging of the abdomen and pelvis was performed following the standard protocol without IV contrast. COMPARISON:  CT lumbar spine 09/23/2019, CT abdomen pelvis 07/22/2019 FINDINGS: Lower chest: Redemonstration of calcified right lower lobe pulmonary nodules (4:14, 8). Similar finding on the left in the lower lobe (4:4). Hepatobiliary: No focal liver  abnormality. No gallstones, gallbladder wall thickening, or pericholecystic fluid. No biliary dilatation. Pancreas: No focal lesion. Normal pancreatic contour. No surrounding inflammatory changes. No main pancreatic ductal dilatation. Spleen: Normal in size without focal abnormality. Adrenals/Urinary Tract: No adrenal nodule bilaterally. Left perinephric fat stranding. There is a hyperdense 8 x  4 x 12cm subcapsular hematoma along the left kidney leading to mass effect on the renal parenchyma. No nephrolithiasis and no hydronephrosis. No definite contour-deforming renal mass. No ureterolithiasis or hydroureter. The urinary bladder is unremarkable. Stomach/Bowel: PO contrast reaches the transverse colon. Stomach is within normal limits. No evidence of bowel wall thickening or dilatation. Diffuse sigmoid diverticulosis. Appendix appears normal. Vascular/Lymphatic: No abdominal aorta or iliac aneurysm. Severe atherosclerotic plaque of the aorta and its branches. No abdominal, pelvic, or inguinal lymphadenopathy. Reproductive: There is a 2.8 cm right adnexal cystic lesion. The uterus is not well visualized and likely surgically removed. The left adnexal region is unremarkable. Other: Hyperdense free fluid noted coursing into the pelvis no intraperitoneal free fluid. No intraperitoneal free gas. No organized fluid collection. Musculoskeletal: Small fat containing umbilical hernia. No suspicious lytic or blastic osseous lesions. No acute displaced fracture. Multilevel degenerative changes of the spine. Grade 1 anterolisthesis of L4 on L5. IMPRESSION: 1. Acute 8 x 4 x12 cm left renal subcapsular hematoma. 2. Scattered colonic diverticulosis with no acute diverticulitis. 3. Stable bilateral lower lobe calcified pulmonary nodules. 4. A 2.8 cm right adnexal cystic lesion. No follow-up imaging recommended. Note: This recommendation does not apply to premenarchal patients and to those with increased risk (genetic, family  history, elevated tumor markers or other high-risk factors) of ovarian cancer. Reference: JACR 2020 Feb; 17(2):248-254. 5.  Aortic Atherosclerosis (ICD10-I70.0). These results were called by telephone at the time of interpretation on 10/22/2020 at 8:42 pm to provider [facility] , who verbally acknowledged these results. Electronically Signed   By: Iven Finn M.D.   On: 10/10/2020 20:53    EKG: I independently viewed the EKG done and my findings are as followed: EKG was not done in the ED  Assessment/Plan Present on Admission:  Abdominal pain  Anemia in chronic kidney disease  Essential hypertension  Obesity (BMI 30.0-34.9)  Principal Problem:   Abdominal pain Active Problems:   Mixed hyperlipidemia   Obesity (BMI 30.0-34.9)   Essential hypertension   Anemia in chronic kidney disease   Renal hematoma, left, initial encounter   Hyponatremia   Hypoalbuminemia due to protein-calorie malnutrition (HCC)   Hyperglycemia due to diabetes mellitus ([place])   Acute kidney injury superimposed on CKD (HCC)   Leukocytosis  Abdominal pain Patient complained of left lower quadrant abdominal pain with no obvious cause of the pain CT abdomen and pelvis without contrast showed scattered colonic diverticulosis with no acute diverticulitis Continue IV NS at 75 mLs/Hr Continue IV Dilaudid 0.5 mg q.4h p.r.n. for moderate to severe pain Continue IV Zofran p.r.n. Procalcitonin will be down to rule out infectious process  Leukocytosis possibly reactive vs infective WBC 15.4, procalcitonin pending  Left renal subscapular hematoma CT abdomen and pelvis without contrast showed acute 8 x 4 x 12 cm left renal subcapsular hematoma. Continue to monitor patient for worsening of symptoms Nephrology will be consulted  Acute kidney injury CKD stage V/dehydration BUN to creatinine 93/4.48 (baseline creatinine at 2.7 x 3.7) Continue IV hydration Renally adjust medications, avoid nephrotoxic  agents/dehydration/hypotension  Hyponatremia (chronic) This may be due to diuretic use Continue gentle hydration Continue to monitor sodium with serial BMPs Urine and serum osmolality and urine sodium will be checked  Hyperglycemia secondary to T2DM Continue ISS and hypoglycemic protocol  Anemia of chronic disease Hemoglobin at 11.8 (baseline at 12.2-13.4) This is possibly secondary to patient's history of CKD Continue to monitor H/H with morning labs  Essential hypertension (uncontrolled) Continue Lopressor  Hyperlipidemia Continue Zetia and Pravachol  Hypoalbuminemia secondary to mild protein calorie malnutrition Protein supplement will be provided  Obesity (31.7) Patient counseled on diet lifestyle modification   DVT prophylaxis: SCDs  Code Status: Full code  Family Communication: Sister at bedside (all questions answered to satisfaction)  Disposition Plan:  Patient is from:                        home Anticipated DC to:                   SNF or family members home Anticipated DC date:               2-3 days Anticipated DC barriers:          Patient requires inpatient management due to left renal subscapular hematoma, abdominal pain and worsening kidney function pending nephrology consult    Consults called: Nephrology  Admission status: Observation    Bernadette Hoit MD [facility]  10/12/2020, 12:29 AM

## 2020-10-11 NOTE — ED Notes (Signed)
Pt transported for CT 

## 2020-10-11 NOTE — ED Triage Notes (Signed)
Abdominal pain onset today ?

## 2020-10-11 NOTE — ED Provider Notes (Signed)
[facility] EMERGENCY DEPARTMENT Provider Note   CSN: [id number] Arrival date & time: 10/13/2020  1638     History Chief Complaint  Patient presents with   Abdominal Pain    Krista Strickland is a 70 y.o. female.  HPI She presents for evaluation of left lower quadrant abdominal pain that started today and is associated with frequent episodes of small bowel movements today.  She denies preceding constipation or diarrhea.  She ate a larger than usual breakfast, around 11 AM today.  She is not having any vomiting, fever, chills, cough or shortness of breath.  She saw her nephrologist, on 09/15/2020 who made some medication changes at that time.  He also arranged for outpatient renal biopsy to follow-up on chronic renal insufficiency.  She has been troubled by anemia, likely secondary to renal disease, and has had transfusions recently.  She takes narcotics occasionally prescribed by her orthopedist.    Past Medical History:  Diagnosis Date   Anemia    Arthritis    CKD (chronic kidney disease) stage 3, GFR 30-59 ml/min (HCC)    Diabetes mellitus, type 2 ([place])    Essential hypertension    GERD (gastroesophageal reflux disease)    Gout    History of MRSA infection 03/2009   History of stroke 2013   HOCM (hypertrophic obstructive cardiomyopathy) ([place])    Hypercalcemia 2017   Managed by nephrology   Hyperlipidemia    Obesity    Psoriasis    Uterine cancer ([place])    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    Patient Active Problem List   Diagnosis Date Noted   Pre-op examination 03/17/2020   Corneal edema of left eye 02/05/2020   Sleep-disordered breathing 09/02/2019   Type 2 diabetes mellitus with diabetic chronic kidney disease ([place]) 08/17/2019   Symptomatic anemia 32/35/5732   Diastolic dysfunction 20/25/4270   Anemia in chronic kidney disease 12/18/2018   Hypercalcemia 12/18/2018   Proteinuria 12/18/2018   Secondary hyperparathyroidism ([place]) 12/18/2018   Vitamin D deficiency  12/18/2018   CKD (chronic kidney disease) stage 4, GFR 15-29 ml/min ([place]) 12/18/2018   Hypertensive retinopathy of both eyes 11/14/2018   Nuclear sclerotic cataract of both eyes 11/14/2018   Iritis of left eye 11/14/2018   Diabetic neuropathy ([place]) 11/09/2015   Degenerative spondylolisthesis 09/21/2015   Leg edema 11/30/2014   Lumbar radiculopathy 11/11/2014   Multinodular goiter (nontoxic) 09/20/2013   Hypertriglyceridemia 08/05/2013   Back pain 04/16/2013   CVA (cerebral vascular accident) ([place]) 07/23/2011   SLEEP APNEA 09/06/2009   Hyperuricemia 11/04/2008   Hyperlipidemia LDL goal <100 07/02/2007   Obesity (BMI 30.0-34.9) 07/02/2007   Essential hypertension 07/02/2007    Past Surgical History:  Procedure Laterality Date   ABDOMINAL HYSTERECTOMY     APPENDECTOMY  2012   COLONOSCOPY WITH PROPOFOL N/A 08/24/2016   Procedure: COLONOSCOPY WITH PROPOFOL;  Surgeon: Rogene Houston, MD;  Location: [facility] ENDO SUITE;  Service: Endoscopy;  Laterality: N/A;  10:30   Excison of right breast cyst     LAPAROSCOPIC SALPINGO OOPHERECTOMY Right 04/22/2012   Procedure: LAPAROSCOPIC SALPINGO OOPHORECTOMY;  Surgeon: Jonnie Kind, MD;  Location: [facility] ORS;  Service: Gynecology;  Laterality: Right;  end 11:17   MASS EXCISION N/A 04/22/2012   Procedure: EXCISION SKIN TAGS NECK AND HEAD;  Surgeon: Jonnie Kind, MD;  Location: [facility] ORS;  Service: Gynecology;  Laterality: N/A;  start 11:19   PARTIAL HYSTERECTOMY     POLYPECTOMY  08/24/2016   Procedure: POLYPECTOMY;  Surgeon: Rogene Houston, MD;  Location: [facility] END[facility];  Service: Endoscopy;;  colon   Right neck biopsy       OB History     Gravida  1   Para  1   Term      Preterm      AB      Living         SAB      IAB      Ectopic      Multiple      Live Births              Family History  Problem Relation Age of Onset   Hypertension Brother    Gout Brother    Prostate cancer Brother    Hypertension Brother     Prostate cancer Brother    Gout Brother    Hypertension Sister    Gout Sister    Leukemia Sister 49   Pancreatic cancer Sister 6   Gout Sister    Prostate cancer Brother    Diabetes Neg Hx     Social History   Tobacco Use   Smoking status: Former    Packs/day: 0.25    Years: 12.00    Pack years: 3.00    Types: Cigarettes    Quit date: 07/16/1978    Years since quitting: 42.2   Smokeless tobacco: Never  Vaping Use   Vaping Use: Never used  Substance Use Topics   Alcohol use: No   Drug use: No    Home Medications Prior to Admission medications   Medication Sig Start Date End Date Taking? Authorizing Provider  aspirin EC 81 MG tablet Take 1 tablet (81 mg total) by mouth daily. Swallow whole. 09/13/20   Satira Sark, MD  calcitRIOL (ROCALTROL) 0.25 MCG capsule Take 0.25 mcg by mouth daily.    [provider]  cyclobenzaprine (FLEXERIL) 10 MG tablet TAKE 1 TABLET BY MOUTH  EVERY NIGHT AT  BEDTIME AS NEEDED FOR SPASM Patient not taking: Reported on 10/06/2020 08/15/20   Sanjuana Kava, MD  DILT-XR 180 MG 24 hr capsule Take 1 capsule by mouth once daily 09/05/20   Fayrene Helper, MD  epoetin alfa (EPOGEN) 3000 UNIT/ML injection Inject 3,000 Units into the vein every 14 (fourteen) days.     Bhutani, Manpreet S, MD  ezetimibe (ZETIA) 10 MG tablet Take 1 tablet (10 mg total) by mouth daily. 11/12/19   Fayrene Helper, MD  febuxostat (ULORIC) 40 MG tablet One daily for gout Patient taking differently: Take 40 mg by mouth daily. One daily for gout 06/14/20   Sanjuana Kava, MD  gabapentin (NEURONTIN) 600 MG tablet Take 300 mg by mouth 2 (two) times daily. 03/21/20   [provider]  hydrALAZINE (APRESOLINE) 25 MG tablet TAKE 1 TABLET BY MOUTH TWICE DAILY (AT  9:00  AM  AND  9:00  PM) 08/15/20   Fayrene Helper, MD  HYDROcodone-acetaminophen (NORCO/VICODIN) 5-325 MG tablet One tablet every six hours for pain.  Limit 7 days. Patient taking differently: Take 1  tablet by mouth every 6 (six) hours as needed for severe pain. Limit 7 days. 09/13/20   Sanjuana Kava, MD  insulin NPH-regular Human (NOVOLIN 70/30 RELION) (70-30) 100 UNIT/ML injection Inject 105 Units into the skin daily with breakfast. 06/29/20   Renato Shin, MD  metolazone (ZAROXOLYN) 5 MG tablet Take 1 tablet by mouth once daily 08/15/20   Fayrene Helper, MD  metoprolol tartrate (  LOPRESSOR) 100 MG tablet Take 1 tablet by mouth twice daily Patient taking differently: Take 50 mg by mouth 2 (two) times daily. 05/10/20   Fayrene Helper, MD  [facility] ULTRA test strip USE 1 STRIP TO CHECK GLUCOSE TWICE DAILY 06/20/20   Renato Shin, MD  potassium chloride (KLOR-CON) 10 MEQ tablet Take 30 mEq by mouth 2 (two) times daily.    [provider]  pravastatin (PRAVACHOL) 20 MG tablet TAKE 1 TABLET BY MOUTH ONCE DAILY BEFORE BREAKFAST 08/08/20   Fayrene Helper, MD  prednisoLONE acetate (PRED FORTE) 1 % ophthalmic suspension Place 1 drop into the left eye in the morning and at bedtime. 05/20/20   [provider]  spironolactone (ALDACTONE) 25 MG tablet Take 75 mg by mouth daily. 06/23/20 06/23/21  [provider]  torsemide (DEMADEX) 20 MG tablet Take 2.5 tablets (50 mg total) by mouth 2 (two) times daily. Patient not taking: Reported on 10/06/2020 09/13/20   Satira Sark, MD    Allergies    Benazepril, Metronidazole, Mobic [meloxicam], Penicillins, and Sulfonamide derivatives  Review of Systems   Review of Systems  All other systems reviewed and are negative.  Physical Exam Updated Vital Signs BP 125/70 (BP Location: Right Arm)   Pulse 75   Temp 98.8 F (37.1 C) (Oral)   Resp 16   SpO2 99%   Physical Exam Vitals and nursing note reviewed.  Constitutional:      General: She is not in acute distress.    Appearance: She is well-developed. She is obese. She is not ill-appearing or diaphoretic.  HENT:     Head: Normocephalic and atraumatic.  Eyes:      Conjunctiva/sclera: Conjunctivae normal.     Pupils: Pupils are equal, round, and reactive to light.  Neck:     Trachea: Phonation normal.  Cardiovascular:     Rate and Rhythm: Normal rate and regular rhythm.  Pulmonary:     Effort: Pulmonary effort is normal.     Breath sounds: Normal breath sounds.  Chest:     Chest wall: No tenderness.  Abdominal:     General: There is no distension.     Palpations: Abdomen is soft. There is no mass.     Tenderness: There is no abdominal tenderness. There is no guarding.     Hernia: No hernia is present.  Musculoskeletal:        General: Normal range of motion.     Cervical back: Normal range of motion and neck supple.     Comments: No palpable tenderness of the lumbar regions.  No visible swelling, or skin change, in the left flank region, side of her recent renal biopsy.  Skin:    General: Skin is warm and dry.  Neurological:     Mental Status: She is alert and oriented to person, place, and time.     Motor: No abnormal muscle tone.  Psychiatric:        Mood and Affect: Mood normal.        Behavior: Behavior normal.        Thought Content: Thought content normal.        Judgment: Judgment normal.    ED Results / Procedures / Treatments   Labs (all labs ordered are listed, but only abnormal results are displayed) Labs Reviewed  CBG MONITORING, ED - Abnormal; Notable for the following components:      Result Value   Glucose-Capillary 129 (*)    All other components  within normal limits    EKG None  Radiology No results found.  Procedures .Critical Care Performed by: Daleen Bo, MD Authorized by: Daleen Bo, MD   Critical care provider statement:    Critical care time (minutes):  35   Critical care start time:  10/10/2020 5:42 PM   Critical care end time:  10/08/2020 10:15 PM   Critical care time was exclusive of:  Separately billable procedures and treating other patients   Critical care was time spent personally by  me on the following activities:  Blood draw for specimens, development of treatment plan with patient or surrogate, discussions with consultants, evaluation of patient's response to treatment, examination of patient, obtaining history from patient or surrogate, ordering and performing treatments and interventions, ordering and review of laboratory studies, pulse oximetry, re-evaluation of patient's condition, review of old charts and ordering and review of radiographic studies   Medications Ordered in ED Medications - No data to display  ED Course  I have reviewed the triage vital signs and the nursing notes.  Pertinent labs & imaging results that were available during my care of the patient were reviewed by me and considered in my medical decision making (see chart for details).  Clinical Course as of 10/12/20 0957  Tue Oct 11, 2020  2042 Discussed with radiologist, left renal subcapsular hematoma, on CT. [EW]  2141 Case discussed with on-call interventional radiologist, Ronny Bacon.  He recommends hospitalization for observation and consideration for arteriogram and/or embolization, based on symptoms over the next 24 hours.  He states that the patient could be managed here in [place], [place]. [EW]    Clinical Course User Index [EW] Daleen Bo, MD   MDM Rules/Calculators/A&P                            Patient Vitals for the past 24 hrs:  BP Temp Temp src Pulse Resp SpO2  10/21/2020 1709 125/70 98.8 F (37.1 C) Oral 75 16 99 %    Medical Decision Making:  This patient is presenting for evaluation of abdominal pain starting 4 days after renal biopsy, and eating a large breakfast this morning, which does require a range of treatment options, and is a complaint that involves a high risk of morbidity and mortality. The differential diagnoses include constipation, intracranial infection, complications from recent renal biopsy. I decided to review old records, and in summary  elderly female, with chronic renal insufficiency presenting with abdominal pain 8 days after renal biopsy.  I did not require additional historical information from anyone.  Clinical Laboratory Tests Ordered, included CBC and Metabolic panel. Review indicates normal except white count high, sodium low, chloride low, BUN high, creatinine high, total protein low. Radiologic Tests Ordered, included CT abdomen pelvis.  I independently Visualized: Radiographic images, which show subcapsular left kidney hematoma    Critical Interventions-clinical evaluation, laboratory testing, radiographic imaging, observation and reassessment.  Discussion with radiologist regarding results of imaging.  Discussion with interventional radiologist regarding potential for management by their service.  After These Interventions, the Patient was reevaluated and was found to require hospitalization for stabilization.  CRITICAL CARE-yes Performed by: Daleen Bo  Nursing Notes Reviewed/ Care Coordinated Applicable Imaging Reviewed Interpretation of Laboratory Data incorporated into ED treatment    10:10 PM-Consult complete with hospitalist. Patient case explained and discussed.  He agrees to admit patient for further evaluation and treatment. Call ended at 10:17 PM   Final Clinical Impression(s) /  ED Diagnoses Final diagnoses:  Hematoma of left kidney, initial encounter  Hypertension, unspecified type    Rx / DC Orders ED Discharge Orders     None        Daleen Bo, MD 10/12/20 1003

## 2020-10-12 ENCOUNTER — Observation Stay (HOSPITAL_COMMUNITY): Payer: HMO

## 2020-10-12 ENCOUNTER — Inpatient Hospital Stay (HOSPITAL_COMMUNITY): Payer: HMO

## 2020-10-12 DIAGNOSIS — R1030 Lower abdominal pain, unspecified: Secondary | ICD-10-CM | POA: Diagnosis not present

## 2020-10-12 DIAGNOSIS — D631 Anemia in chronic kidney disease: Secondary | ICD-10-CM | POA: Diagnosis present

## 2020-10-12 DIAGNOSIS — D72829 Elevated white blood cell count, unspecified: Secondary | ICD-10-CM

## 2020-10-12 DIAGNOSIS — I469 Cardiac arrest, cause unspecified: Secondary | ICD-10-CM | POA: Diagnosis not present

## 2020-10-12 DIAGNOSIS — N170 Acute kidney failure with tubular necrosis: Secondary | ICD-10-CM | POA: Diagnosis not present

## 2020-10-12 DIAGNOSIS — A419 Sepsis, unspecified organism: Secondary | ICD-10-CM | POA: Diagnosis not present

## 2020-10-12 DIAGNOSIS — I10 Essential (primary) hypertension: Secondary | ICD-10-CM | POA: Diagnosis not present

## 2020-10-12 DIAGNOSIS — I5030 Unspecified diastolic (congestive) heart failure: Secondary | ICD-10-CM | POA: Diagnosis not present

## 2020-10-12 DIAGNOSIS — J9601 Acute respiratory failure with hypoxia: Secondary | ICD-10-CM | POA: Diagnosis not present

## 2020-10-12 DIAGNOSIS — N189 Chronic kidney disease, unspecified: Secondary | ICD-10-CM | POA: Diagnosis not present

## 2020-10-12 DIAGNOSIS — E1122 Type 2 diabetes mellitus with diabetic chronic kidney disease: Secondary | ICD-10-CM | POA: Diagnosis present

## 2020-10-12 DIAGNOSIS — N181 Chronic kidney disease, stage 1: Secondary | ICD-10-CM | POA: Diagnosis not present

## 2020-10-12 DIAGNOSIS — N99840 Postprocedural hematoma of a genitourinary system organ or structure following a genitourinary system procedure: Secondary | ICD-10-CM | POA: Diagnosis present

## 2020-10-12 DIAGNOSIS — G9341 Metabolic encephalopathy: Secondary | ICD-10-CM | POA: Diagnosis not present

## 2020-10-12 DIAGNOSIS — I248 Other forms of acute ischemic heart disease: Secondary | ICD-10-CM | POA: Diagnosis not present

## 2020-10-12 DIAGNOSIS — R570 Cardiogenic shock: Secondary | ICD-10-CM | POA: Diagnosis not present

## 2020-10-12 DIAGNOSIS — Z992 Dependence on renal dialysis: Secondary | ICD-10-CM | POA: Diagnosis not present

## 2020-10-12 DIAGNOSIS — I422 Other hypertrophic cardiomyopathy: Secondary | ICD-10-CM | POA: Diagnosis not present

## 2020-10-12 DIAGNOSIS — S37012A Minor contusion of left kidney, initial encounter: Secondary | ICD-10-CM | POA: Diagnosis not present

## 2020-10-12 DIAGNOSIS — Z978 Presence of other specified devices: Secondary | ICD-10-CM | POA: Diagnosis not present

## 2020-10-12 DIAGNOSIS — E669 Obesity, unspecified: Secondary | ICD-10-CM | POA: Diagnosis present

## 2020-10-12 DIAGNOSIS — R109 Unspecified abdominal pain: Secondary | ICD-10-CM | POA: Diagnosis present

## 2020-10-12 DIAGNOSIS — N179 Acute kidney failure, unspecified: Secondary | ICD-10-CM | POA: Diagnosis not present

## 2020-10-12 DIAGNOSIS — E1140 Type 2 diabetes mellitus with diabetic neuropathy, unspecified: Secondary | ICD-10-CM | POA: Diagnosis present

## 2020-10-12 DIAGNOSIS — R6521 Severe sepsis with septic shock: Secondary | ICD-10-CM | POA: Diagnosis not present

## 2020-10-12 DIAGNOSIS — N185 Chronic kidney disease, stage 5: Secondary | ICD-10-CM | POA: Diagnosis present

## 2020-10-12 DIAGNOSIS — J690 Pneumonitis due to inhalation of food and vomit: Secondary | ICD-10-CM | POA: Diagnosis not present

## 2020-10-12 DIAGNOSIS — I700 Atherosclerosis of aorta: Secondary | ICD-10-CM | POA: Diagnosis present

## 2020-10-12 DIAGNOSIS — Z20822 Contact with and (suspected) exposure to covid-19: Secondary | ICD-10-CM | POA: Diagnosis present

## 2020-10-12 DIAGNOSIS — I421 Obstructive hypertrophic cardiomyopathy: Secondary | ICD-10-CM | POA: Diagnosis not present

## 2020-10-12 DIAGNOSIS — I2699 Other pulmonary embolism without acute cor pulmonale: Secondary | ICD-10-CM | POA: Diagnosis present

## 2020-10-12 DIAGNOSIS — I1311 Hypertensive heart and chronic kidney disease without heart failure, with stage 5 chronic kidney disease, or end stage renal disease: Secondary | ICD-10-CM | POA: Diagnosis present

## 2020-10-12 DIAGNOSIS — E1165 Type 2 diabetes mellitus with hyperglycemia: Secondary | ICD-10-CM | POA: Diagnosis present

## 2020-10-12 DIAGNOSIS — Y848 Other medical procedures as the cause of abnormal reaction of the patient, or of later complication, without mention of misadventure at the time of the procedure: Secondary | ICD-10-CM | POA: Diagnosis present

## 2020-10-12 DIAGNOSIS — Z66 Do not resuscitate: Secondary | ICD-10-CM | POA: Diagnosis not present

## 2020-10-12 DIAGNOSIS — Z452 Encounter for adjustment and management of vascular access device: Secondary | ICD-10-CM | POA: Diagnosis not present

## 2020-10-12 DIAGNOSIS — E440 Moderate protein-calorie malnutrition: Secondary | ICD-10-CM | POA: Diagnosis present

## 2020-10-12 DIAGNOSIS — N184 Chronic kidney disease, stage 4 (severe): Secondary | ICD-10-CM | POA: Diagnosis not present

## 2020-10-12 DIAGNOSIS — J9602 Acute respiratory failure with hypercapnia: Secondary | ICD-10-CM | POA: Diagnosis not present

## 2020-10-12 DIAGNOSIS — Z515 Encounter for palliative care: Secondary | ICD-10-CM | POA: Diagnosis not present

## 2020-10-12 DIAGNOSIS — E1169 Type 2 diabetes mellitus with other specified complication: Secondary | ICD-10-CM | POA: Diagnosis not present

## 2020-10-12 DIAGNOSIS — J90 Pleural effusion, not elsewhere classified: Secondary | ICD-10-CM | POA: Diagnosis present

## 2020-10-12 LAB — NA AND K (SODIUM & POTASSIUM), RAND UR
Potassium Urine: 42 mmol/L
Sodium, Ur: 15 mmol/L

## 2020-10-12 LAB — COMPREHENSIVE METABOLIC PANEL
ALT: 34 U/L (ref 0–44)
AST: 24 U/L (ref 15–41)
Albumin: 3.1 g/dL — ABNORMAL LOW (ref 3.5–5.0)
Alkaline Phosphatase: 76 U/L (ref 38–126)
Anion gap: 12 (ref 5–15)
BUN: 94 mg/dL — ABNORMAL HIGH (ref 8–23)
CO2: 23 mmol/L (ref 22–32)
Calcium: 9.1 mg/dL (ref 8.9–10.3)
Chloride: 91 mmol/L — ABNORMAL LOW (ref 98–111)
Creatinine, Ser: 4.46 mg/dL — ABNORMAL HIGH (ref 0.44–1.00)
GFR, Estimated: 10 mL/min — ABNORMAL LOW (ref >60)
Glucose, Bld: 54 mg/dL — ABNORMAL LOW (ref 70–99)
Potassium: 4.5 mmol/L (ref 3.5–5.1)
Sodium: 126 mmol/L — ABNORMAL LOW (ref 135–145)
Total Bilirubin: 0.5 mg/dL (ref 0.3–1.2)
Total Protein: 6.4 g/dL — ABNORMAL LOW (ref 6.5–8.1)

## 2020-10-12 LAB — MAGNESIUM: Magnesium: 1.8 mg/dL (ref 1.7–2.4)

## 2020-10-12 LAB — URINALYSIS, ROUTINE W REFLEX MICROSCOPIC
Bacteria, UA: NONE SEEN
Bilirubin Urine: NEGATIVE
Glucose, UA: 50 mg/dL — AB
Ketones, ur: NEGATIVE mg/dL
Leukocytes,Ua: NEGATIVE
Nitrite: NEGATIVE
Protein, ur: >=300 mg/dL — AB
Specific Gravity, Urine: 1.008 (ref 1.005–1.030)
pH: 6 (ref 5.0–8.0)

## 2020-10-12 LAB — CBC
HCT: 32.6 % — ABNORMAL LOW (ref 36.0–46.0)
HCT: 31.3 % — ABNORMAL LOW (ref 36.0–46.0)
HCT: 30.6 % — ABNORMAL LOW (ref 36.0–46.0)
Hemoglobin: 10.8 g/dL — ABNORMAL LOW (ref 12.0–15.0)
Hemoglobin: 10.1 g/dL — ABNORMAL LOW (ref 12.0–15.0)
Hemoglobin: 10.1 g/dL — ABNORMAL LOW (ref 12.0–15.0)
MCH: 28.9 pg (ref 26.0–34.0)
MCH: 28.5 pg (ref 26.0–34.0)
MCH: 29.2 pg (ref 26.0–34.0)
MCHC: 33.1 g/dL (ref 30.0–36.0)
MCHC: 32.3 g/dL (ref 30.0–36.0)
MCHC: 33 g/dL (ref 30.0–36.0)
MCV: 87.2 fL (ref 80.0–100.0)
MCV: 88.2 fL (ref 80.0–100.0)
MCV: 88.4 fL (ref 80.0–100.0)
Platelets: 244 10*3/uL (ref 150–400)
Platelets: 216 10*3/uL (ref 150–400)
Platelets: 206 10*3/uL (ref 150–400)
RBC: 3.74 MIL/uL — ABNORMAL LOW (ref 3.87–5.11)
RBC: 3.55 MIL/uL — ABNORMAL LOW (ref 3.87–5.11)
RBC: 3.46 MIL/uL — ABNORMAL LOW (ref 3.87–5.11)
RDW: 13.4 % (ref 11.5–15.5)
RDW: 13.5 % (ref 11.5–15.5)
RDW: 13.7 % (ref 11.5–15.5)
WBC: 14.7 10*3/uL — ABNORMAL HIGH (ref 4.0–10.5)
WBC: 13.3 10*3/uL — ABNORMAL HIGH (ref 4.0–10.5)
WBC: 15.4 10*3/uL — ABNORMAL HIGH (ref 4.0–10.5)
nRBC: 0 % (ref 0.0–0.2)
nRBC: 0 % (ref 0.0–0.2)
nRBC: 0 % (ref 0.0–0.2)

## 2020-10-12 LAB — OSMOLALITY, URINE: Osmolality, Ur: 278 mOsm/kg — ABNORMAL LOW (ref 300–900)

## 2020-10-12 LAB — APTT: aPTT: 27 seconds (ref 24–36)

## 2020-10-12 LAB — CBG MONITORING, ED
Glucose-Capillary: 144 mg/dL — ABNORMAL HIGH (ref 70–99)
Glucose-Capillary: 265 mg/dL — ABNORMAL HIGH (ref 70–99)
Glucose-Capillary: 55 mg/dL — ABNORMAL LOW (ref 70–99)
Glucose-Capillary: 74 mg/dL (ref 70–99)
Glucose-Capillary: 76 mg/dL (ref 70–99)

## 2020-10-12 LAB — PHOSPHORUS: Phosphorus: 6.2 mg/dL — ABNORMAL HIGH (ref 2.5–4.6)

## 2020-10-12 LAB — GLUCOSE, CAPILLARY
Glucose-Capillary: 216 mg/dL — ABNORMAL HIGH (ref 70–99)
Glucose-Capillary: 183 mg/dL — ABNORMAL HIGH (ref 70–99)

## 2020-10-12 LAB — PROTIME-INR
INR: 1.1 (ref 0.8–1.2)
Prothrombin Time: 14.3 seconds (ref 11.4–15.2)

## 2020-10-12 LAB — HIV ANTIBODY (ROUTINE TESTING W REFLEX): HIV Screen 4th Generation wRfx: NONREACTIVE

## 2020-10-12 LAB — OSMOLALITY: Osmolality: 290 mOsm/kg (ref 275–295)

## 2020-10-12 LAB — PROCALCITONIN: Procalcitonin: 0.34 ng/mL

## 2020-10-12 MED ORDER — CHLORHEXIDINE GLUCONATE CLOTH 2 % EX PADS
6.0000 | MEDICATED_PAD | Freq: Every day | CUTANEOUS | Status: DC
  Administered 2020-10-12 – 2020-10-22 (×11): 6 via TOPICAL

## 2020-10-12 MED ORDER — SODIUM CHLORIDE 0.9 % IV SOLN: INTRAVENOUS | Status: DC

## 2020-10-12 MED ORDER — NICARDIPINE HCL IN NACL 20-0.86 MG/200ML-% IV SOLN
3.0000 mg/h | INTRAVENOUS | Status: DC
  Filled 2020-10-12: qty 200

## 2020-10-12 MED ORDER — HYDROCODONE-ACETAMINOPHEN 5-325 MG PO TABS
1.0000 | ORAL_TABLET | Freq: Four times a day (QID) | ORAL | Status: DC | PRN
  Administered 2020-10-12 – 2020-10-14 (×3): 1 via ORAL
  Filled 2020-10-12 (×3): qty 1

## 2020-10-12 MED ORDER — HYDRALAZINE HCL 50 MG PO TABS
50.0000 mg | ORAL_TABLET | Freq: Three times a day (TID) | ORAL | Status: DC
  Administered 2020-10-12 – 2020-10-14 (×7): 50 mg via ORAL
  Filled 2020-10-12: qty 1
  Filled 2020-10-12 (×7): qty 2

## 2020-10-12 MED ORDER — CALCITRIOL 0.25 MCG PO CAPS
0.2500 ug | ORAL_CAPSULE | Freq: Every day | ORAL | Status: DC
  Administered 2020-10-12 – 2020-10-15 (×4): 0.25 ug via ORAL
  Filled 2020-10-12 (×4): qty 1

## 2020-10-12 MED ORDER — GLUCERNA SHAKE PO LIQD
237.0000 mL | Freq: Three times a day (TID) | ORAL | Status: DC
  Administered 2020-10-12 – 2020-10-13 (×4): 237 mL via ORAL
  Filled 2020-10-12 (×5): qty 237

## 2020-10-12 MED ORDER — DILTIAZEM HCL 25 MG/5ML IV SOLN
10.0000 mg | Freq: Once | INTRAVENOUS | Status: AC
  Administered 2020-10-12: 10 mg via INTRAVENOUS
  Filled 2020-10-12: qty 5

## 2020-10-12 MED ORDER — GABAPENTIN 300 MG PO CAPS
300.0000 mg | ORAL_CAPSULE | Freq: Two times a day (BID) | ORAL | Status: DC
  Administered 2020-10-12 (×2): 300 mg via ORAL
  Filled 2020-10-12 (×2): qty 1

## 2020-10-12 MED ORDER — ONDANSETRON HCL 4 MG/2ML IJ SOLN: 4.0000 mg | Freq: Four times a day (QID) | INTRAMUSCULAR | Status: DC | PRN

## 2020-10-12 MED ORDER — PRAVASTATIN SODIUM 40 MG PO TABS
20.0000 mg | ORAL_TABLET | Freq: Every day | ORAL | Status: DC
  Administered 2020-10-12 – 2020-10-14 (×3): 20 mg via ORAL
  Filled 2020-10-12 (×3): qty 2

## 2020-10-12 MED ORDER — HYDRALAZINE HCL 20 MG/ML IJ SOLN
10.0000 mg | INTRAMUSCULAR | Status: DC | PRN
  Administered 2020-10-12 – 2020-10-21 (×8): 10 mg via INTRAVENOUS
  Filled 2020-10-12 (×8): qty 1

## 2020-10-12 MED ORDER — INSULIN ASPART 100 UNIT/ML IJ SOLN
0.0000 [IU] | Freq: Three times a day (TID) | INTRAMUSCULAR | Status: DC
  Administered 2020-10-12: 2 [IU] via SUBCUTANEOUS
  Administered 2020-10-12: 3 [IU] via SUBCUTANEOUS
  Administered 2020-10-13: 2 [IU] via SUBCUTANEOUS
  Administered 2020-10-13 – 2020-10-15 (×4): 1 [IU] via SUBCUTANEOUS
  Filled 2020-10-12: qty 1

## 2020-10-12 MED ORDER — INSULIN ASPART 100 UNIT/ML IJ SOLN: 0.0000 [IU] | Freq: Every day | INTRAMUSCULAR | Status: DC

## 2020-10-12 MED ORDER — EZETIMIBE 10 MG PO TABS
10.0000 mg | ORAL_TABLET | Freq: Every day | ORAL | Status: DC
  Administered 2020-10-12 – 2020-10-15 (×4): 10 mg via ORAL
  Filled 2020-10-12 (×4): qty 1

## 2020-10-12 MED ORDER — INFLUENZA VAC A&B SA ADJ QUAD 0.5 ML IM PRSY
0.5000 mL | PREFILLED_SYRINGE | INTRAMUSCULAR | Status: DC
  Filled 2020-10-12: qty 0.5

## 2020-10-12 MED ORDER — HYDRALAZINE HCL 25 MG PO TABS
25.0000 mg | ORAL_TABLET | Freq: Two times a day (BID) | ORAL | Status: DC
  Administered 2020-10-12: 25 mg via ORAL
  Filled 2020-10-12: qty 1

## 2020-10-12 MED ORDER — METOPROLOL TARTRATE 50 MG PO TABS
50.0000 mg | ORAL_TABLET | Freq: Two times a day (BID) | ORAL | Status: DC
  Administered 2020-10-12 – 2020-10-13 (×4): 50 mg via ORAL
  Filled 2020-10-12 (×4): qty 1

## 2020-10-12 MED ORDER — SODIUM CHLORIDE 0.9 % IV SOLN: Freq: Once | INTRAVENOUS | Status: AC

## 2020-10-12 MED ORDER — FEBUXOSTAT 40 MG PO TABS
40.0000 mg | ORAL_TABLET | Freq: Every day | ORAL | Status: DC
  Administered 2020-10-12 – 2020-10-14 (×3): 40 mg via ORAL
  Filled 2020-10-12 (×6): qty 1

## 2020-10-12 MED ORDER — LIVING WELL WITH DIABETES BOOK
Freq: Once | Status: AC
  Filled 2020-10-12: qty 1

## 2020-10-12 MED ORDER — INSULIN ASPART PROT & ASPART (70-30 MIX) 100 UNIT/ML ~~LOC~~ SUSP
50.0000 [IU] | Freq: Every day | SUBCUTANEOUS | Status: DC
  Administered 2020-10-12 – 2020-10-14 (×3): 50 [IU] via SUBCUTANEOUS
  Filled 2020-10-12 (×2): qty 10

## 2020-10-12 MED ORDER — DILTIAZEM HCL ER 180 MG PO CP24
180.0000 mg | ORAL_CAPSULE | Freq: Every day | ORAL | Status: DC
  Administered 2020-10-12 – 2020-10-14 (×3): 180 mg via ORAL
  Filled 2020-10-12 (×9): qty 1

## 2020-10-12 NOTE — Consult Note (Signed)
Chief Complaint: Patient was seen in consultation today for  consideration of renal artery embolization Chief Complaint  Patient presents with   Abdominal Pain   at the request of Dr Josephine Cables   Supervising Physician: Arne Cleveland  Patient Status: [facility] ED  History of Present Illness: Krista Strickland is a 70 y.o. female   Pt underwent Random renal biopsy at [facility] 10/07/20 Ordered per Dr Theador Hawthorne-- secondary proteinuria; worsening renal function Biopsy was uneventful and she tolerated well Post biopsy short stay stay was without complaint Discharged stable  Comes to ED at [facility] yesterday with abd pain- mostly left sided Imaging: CT:  IMPRESSION: 1. Acute 8 x 4 x12 cm left renal subcapsular hematoma. 2. Scattered colonic diverticulosis with no acute diverticulitis. 3. Stable bilateral lower lobe calcified pulmonary nodules. 4. A 2.8 cm right adnexal cystic lesion. No follow-up imaging recommended. Note: This recommendation does not apply to premenarchal patients and to those with increased risk (genetic, family history, elevated tumor markers or other high-risk factors) of ovarian cancer. Reference: JACR 2020 Feb; 17(2):248-254. 5.  Aortic Atherosclerosis (ICD10-I70.0).  Hg 11.8 on arrival; 10.8 this am (9/9  13.4) She says pain is less Stable in ED room 186/54; P 66   MD has asked IR to evaluate for possible embolization if needed To be admitted with [facility] for now  Past Medical History:  Diagnosis Date   Anemia    Arthritis    CKD (chronic kidney disease) stage 3, GFR 30-59 ml/min (HCC)    Diabetes mellitus, type 2 ([place])    Essential hypertension    GERD (gastroesophageal reflux disease)    Gout    History of MRSA infection 03/2009   History of stroke 2013   HOCM (hypertrophic obstructive cardiomyopathy) ([place])    Hypercalcemia 2017   Managed by nephrology   Hyperlipidemia    Obesity    Psoriasis    Uterine cancer ([place])    Vision loss of right eye  10/24/2011    Past Surgical History:  Procedure Laterality Date   ABDOMINAL HYSTERECTOMY     APPENDECTOMY  2012   COLONOSCOPY WITH PROPOFOL N/A 08/24/2016   Procedure: COLONOSCOPY WITH PROPOFOL;  Surgeon: Rogene Houston, MD;  Location: [facility] ENDO SUITE;  Service: Endoscopy;  Laterality: N/A;  10:30   Excison of right breast cyst     LAPAROSCOPIC SALPINGO OOPHERECTOMY Right 04/22/2012   Procedure: LAPAROSCOPIC SALPINGO OOPHORECTOMY;  Surgeon: Jonnie Kind, MD;  Location: [facility] ORS;  Service: Gynecology;  Laterality: Right;  end 11:17   MASS EXCISION N/A 04/22/2012   Procedure: EXCISION SKIN TAGS NECK AND HEAD;  Surgeon: Jonnie Kind, MD;  Location: [facility] ORS;  Service: Gynecology;  Laterality: N/A;  start 11:19   PARTIAL HYSTERECTOMY     POLYPECTOMY  08/24/2016   Procedure: POLYPECTOMY;  Surgeon: Rogene Houston, MD;  Location: [facility] ENDO SUITE;  Service: Endoscopy;;  colon   Right neck biopsy      Allergies: Benazepril, Metronidazole, Mobic [meloxicam], Penicillins, and Sulfonamide derivatives  Medications: Prior to Admission medications   Medication Sig Start Date End Date Taking? Authorizing Provider  aspirin EC 81 MG tablet Take 1 tablet (81 mg total) by mouth daily. Swallow whole. Patient taking differently: Take 325 mg by mouth daily. Swallow whole. 09/13/20  Yes Satira Sark, MD  calcitRIOL (ROCALTROL) 0.25 MCG capsule Take 0.25 mcg by mouth 3 (three) times a week.   Yes [provider]  cyclobenzaprine (FLEXERIL) 10 MG tablet TAKE  1 TABLET BY MOUTH  EVERY NIGHT AT  BEDTIME AS NEEDED FOR SPASM Patient taking differently: Take 10 mg by mouth at bedtime as needed for muscle spasms. 08/15/20  Yes Sanjuana Kava, MD  DILT-XR 180 MG 24 hr capsule Take 1 capsule by mouth once daily Patient taking differently: Take 180 mg by mouth daily. 09/05/20  Yes Fayrene Helper, MD  epoetin alfa (EPOGEN) 3000 UNIT/ML injection Inject 3,000 Units into the vein every 14 (fourteen) days.     Yes Bhutani, Manpreet S, MD  ezetimibe (ZETIA) 10 MG tablet Take 1 tablet (10 mg total) by mouth daily. 11/12/19  Yes Fayrene Helper, MD  febuxostat (ULORIC) 40 MG tablet One daily for gout Patient taking differently: Take 40 mg by mouth daily. One daily for gout 06/14/20  Yes Sanjuana Kava, MD  gabapentin (NEURONTIN) 600 MG tablet Take 300 mg by mouth 2 (two) times daily. 03/21/20  Yes [provider]  hydrALAZINE (APRESOLINE) 25 MG tablet TAKE 1 TABLET BY MOUTH TWICE DAILY (AT  9:00  AM  AND  9:00  PM) Patient taking differently: Take 25 mg by mouth in the morning and at bedtime. 08/15/20  Yes Fayrene Helper, MD  HYDROcodone-acetaminophen (NORCO/VICODIN) 5-325 MG tablet One tablet every six hours for pain.  Limit 7 days. Patient taking differently: Take 1 tablet by mouth every 6 (six) hours as needed for severe pain. Limit 7 days. 09/13/20  Yes Sanjuana Kava, MD  insulin NPH-regular Human (NOVOLIN 70/30 RELION) (70-30) 100 UNIT/ML injection Inject 105 Units into the skin daily with breakfast. 06/29/20  Yes Renato Shin, MD  metoprolol tartrate (LOPRESSOR) 100 MG tablet Take 1 tablet by mouth twice daily Patient taking differently: Take 50 mg by mouth 2 (two) times daily. 05/10/20  Yes Fayrene Helper, MD  potassium chloride (KLOR-CON) 10 MEQ tablet Take 30 mEq by mouth 2 (two) times daily.   Yes [provider]  pravastatin (PRAVACHOL) 20 MG tablet TAKE 1 TABLET BY MOUTH ONCE DAILY BEFORE BREAKFAST Patient taking differently: Take 20 mg by mouth daily. 08/08/20  Yes Fayrene Helper, MD  prednisoLONE acetate (PRED FORTE) 1 % ophthalmic suspension Place 1 drop into the left eye in the morning and at bedtime. 05/20/20  Yes [provider]  torsemide (DEMADEX) 20 MG tablet Take 2.5 tablets (50 mg total) by mouth 2 (two) times daily. 09/13/20  Yes Satira Sark, MD  metolazone (ZAROXOLYN) 5 MG tablet Take 1 tablet by mouth once daily Patient taking  differently: Take 5 mg by mouth daily. 08/15/20   Fayrene Helper, MD  [facility] ULTRA test strip USE 1 STRIP TO CHECK GLUCOSE TWICE DAILY 06/20/20   Renato Shin, MD  spironolactone (ALDACTONE) 25 MG tablet Take 75 mg by mouth daily. 06/23/20 06/23/21  [provider]     Family History  Problem Relation Age of Onset   Hypertension Brother    Gout Brother    Prostate cancer Brother    Hypertension Brother    Prostate cancer Brother    Gout Brother    Hypertension Sister    Gout Sister    Leukemia Sister 67   Pancreatic cancer Sister 23   Gout Sister    Prostate cancer Brother    Diabetes Neg Hx     Social History   Socioeconomic History   Marital status: Married    Spouse name: Not on file   Number of children: 1   Years of education: Not on  file   Highest education level: 11th grade  Occupational History   Occupation: disabled     Employer: UNEMPLOYED  Tobacco Use   Smoking status: Former    Packs/day: 0.25    Years: 12.00    Pack years: 3.00    Types: Cigarettes    Quit date: 07/16/1978    Years since quitting: 42.2   Smokeless tobacco: Never  Vaping Use   Vaping Use: Never used  Substance and Sexual Activity   Alcohol use: No   Drug use: No   Sexual activity: Yes    Birth control/protection: Surgical  Other Topics Concern   Not on file  Social History Narrative   Not on file   Social Determinants of Health   Financial Resource Strain: Low Risk    Difficulty of Paying Living Expenses: Not hard at all  Food Insecurity: No Food Insecurity   Worried About Charity fundraiser in the Last Year: Never true   [place] in the Last Year: Never true  Transportation Needs: No Transportation Needs   Lack of Transportation (Medical): No   Lack of Transportation (Non-Medical): No  Physical Activity: Inactive   Days of Exercise per Week: 0 days   Minutes of Exercise per Session: 0 min  Stress: No Stress Concern Present   Feeling of Stress : Only  a little  Social Connections: Socially Isolated   Frequency of Communication with Friends and Family: Never   Frequency of Social Gatherings with Friends and Family: Never   Attends Religious Services: Never   Marine scientist or Organizations: No   Attends Music therapist: Never   Marital Status: Married    Review of Systems: A 12 point ROS discussed and pertinent positives are indicated in the HPI above.  All other systems are negative.    Vital Signs: BP (!) 186/84   Pulse 67   Temp 98.8 F (37.1 C) (Oral)   Resp 17   SpO2 98%   Physical Exam Vitals reviewed.  HENT:     Mouth/Throat:     Mouth: Mucous membranes are moist.  Cardiovascular:     Rate and Rhythm: Normal rate and regular rhythm.  Pulmonary:     Effort: Pulmonary effort is normal.     Breath sounds: Normal breath sounds.  Abdominal:     Palpations: Abdomen is soft.     Tenderness: There is no abdominal tenderness.  Skin:    General: Skin is warm.  Neurological:     Mental Status: She is alert and oriented to person, place, and time.  Psychiatric:        Behavior: Behavior normal.    Imaging: CT Abdomen Pelvis Wo Contrast  Result Date: 10/15/2020 CLINICAL DATA:  abdominal pain that began today Pt had renal biopsy 3 days ago EXAM: CT ABDOMEN AND PELVIS WITHOUT CONTRAST TECHNIQUE: Multidetector CT imaging of the abdomen and pelvis was performed following the standard protocol without IV contrast. COMPARISON:  CT lumbar spine 09/23/2019, CT abdomen pelvis 07/22/2019 FINDINGS: Lower chest: Redemonstration of calcified right lower lobe pulmonary nodules (4:14, 8). Similar finding on the left in the lower lobe (4:4). Hepatobiliary: No focal liver abnormality. No gallstones, gallbladder wall thickening, or pericholecystic fluid. No biliary dilatation. Pancreas: No focal lesion. Normal pancreatic contour. No surrounding inflammatory changes. No main pancreatic ductal dilatation. Spleen: Normal in  size without focal abnormality. Adrenals/Urinary Tract: No adrenal nodule bilaterally. Left perinephric fat stranding. There is a hyperdense 8  x 4 x 12cm subcapsular hematoma along the left kidney leading to mass effect on the renal parenchyma. No nephrolithiasis and no hydronephrosis. No definite contour-deforming renal mass. No ureterolithiasis or hydroureter. The urinary bladder is unremarkable. Stomach/Bowel: PO contrast reaches the transverse colon. Stomach is within normal limits. No evidence of bowel wall thickening or dilatation. Diffuse sigmoid diverticulosis. Appendix appears normal. Vascular/Lymphatic: No abdominal aorta or iliac aneurysm. Severe atherosclerotic plaque of the aorta and its branches. No abdominal, pelvic, or inguinal lymphadenopathy. Reproductive: There is a 2.8 cm right adnexal cystic lesion. The uterus is not well visualized and likely surgically removed. The left adnexal region is unremarkable. Other: Hyperdense free fluid noted coursing into the pelvis no intraperitoneal free fluid. No intraperitoneal free gas. No organized fluid collection. Musculoskeletal: Small fat containing umbilical hernia. No suspicious lytic or blastic osseous lesions. No acute displaced fracture. Multilevel degenerative changes of the spine. Grade 1 anterolisthesis of L4 on L5. IMPRESSION: 1. Acute 8 x 4 x12 cm left renal subcapsular hematoma. 2. Scattered colonic diverticulosis with no acute diverticulitis. 3. Stable bilateral lower lobe calcified pulmonary nodules. 4. A 2.8 cm right adnexal cystic lesion. No follow-up imaging recommended. Note: This recommendation does not apply to premenarchal patients and to those with increased risk (genetic, family history, elevated tumor markers or other high-risk factors) of ovarian cancer. Reference: JACR 2020 Feb; 17(2):248-254. 5.  Aortic Atherosclerosis (ICD10-I70.0). These results were called by telephone at the time of interpretation on 10/16/2020 at 8:42 pm to  provider [facility] , who verbally acknowledged these results. Electronically Signed   By: Iven Finn M.D.   On: 09/29/2020 20:53   US BIOPSY (KIDNEY)  Result Date: 10/07/2020 INDICATION: 70 year old female with a history of proteinuria referred for medical renal biopsy EXAM: IMAGE GUIDED MEDICAL RENAL BIOPSY MEDICATIONS: None. ANESTHESIA/SEDATION: Moderate (conscious) sedation was employed during this procedure. A total of Versed 1.0 mg and Fentanyl 50 mcg was administered intravenously. Moderate Sedation Time: 10 minutes. The patient's level of consciousness and vital signs were monitored continuously by radiology nursing throughout the procedure under my direct supervision. FLUOROSCOPY TIME:  Ultrasound COMPLICATIONS: None PROCEDURE: Informed written consent was obtained from the patient after a thorough discussion of the procedural risks, benefits and alternatives. All questions were addressed. Maximal Sterile Barrier Technique was utilized including caps, mask, sterile gowns, sterile gloves, sterile drape, hand hygiene and skin antiseptic. A timeout was performed prior to the initiation of the procedure. Patient was positioned prone position on the gantry table. Images were stored sent to PACs. Once the patient is prepped and draped in the usual sterile fashion, the skin and subcutaneous tissues overlying the left kidney were generously infiltrated 1% lidocaine for local anesthesia. Using ultrasound guidance, a 15 gauge guide needle was advanced into the lower cortex of the left kidney. Once we confirmed location of the needle tip, 2 separate 16 gauge core biopsy were achieved. Two Gel-Foam pledgets were infused with a small amount of saline. The needle was removed. Final images were stored. The patient tolerated the procedure well and remained hemodynamically stable throughout. No complications were encountered and no significant blood loss encountered. IMPRESSION: Status post ultrasound-guided  medical renal biopsy. Signed, Dulcy Fanny. Dellia Nims, RPVI [facility] Electronically Signed   By: Corrie Mckusick D.O.   On: 10/07/2020 10:11    Labs:  CBC: Recent Labs    09/22/20 1312 10/07/20 0615 09/30/2020 1738 10/12/20 0422  WBC 9.9 11.1* 15.4* 14.7*  HGB  12.8 13.4 11.8* 10.8*  HCT 38.5 41.1 35.7* 32.6*  PLT 279 277 270 244    COAGS: Recent Labs    10/07/20 0615 10/12/20 0422  INR 1.0 1.1  APTT  --  27    BMP: Recent Labs    10/29/19 1417 11/12/19 1424 03/17/20 1624 04/21/20 1537 09/08/20 1130 09/22/20 1312 09/22/20 1313 10/10/2020 1738 10/12/20 0422  NA 133*   < > 138   < > 126*  --  126* 127* 126*  K 3.1*   < > 3.7   < > 4.2  --  4.6 4.8 4.5  CL 94*   < > 93*   < > 92*  --  92* 94* 91*  CO2 25   < > 25   < > 24  --  23 21* 23  GLUCOSE 154*   < > 132*   < > 218*  --  176* 139* 54*  BUN 62*   < > 89*   < > 93*  --  90* 93* 94*  CALCIUM 9.8   < > 10.1   < > 9.0  9.2 9.5 9.0 9.2 9.1  CREATININE 1.71*   < > 2.25*   < > 3.66*  --  3.68* 4.48* 4.46*  GFRNONAA 30*   < > 22*   < > 13*  --  13* 10* 10*  GFRAA 35*  --  25*  --   --   --   --   --   --    < > = values in this interval not displayed.    LIVER FUNCTION TESTS: Recent Labs    03/17/20 1624 04/21/20 1537 09/08/20 1130 09/22/20 1313 10/12/2020 1738 10/12/20 0422  BILITOT <0.2  --   --   --  0.4 0.5  AST 19  --   --   --  39 24  ALT 27  --   --   --  43 34  ALKPHOS 79  --   --   --  82 76  PROT 7.1  --   --   --  7.0 6.4*  ALBUMIN 4.1   < > 3.6 3.4* 3.3* 3.1*   < > = values in this interval not displayed.    TUMOR MARKERS: No results for input(s): AFPTM, CEA, CA199, CHROMGRNA in the last 8760 hours.  Assessment and Plan:  Random renal biopsy in IR 9/9 Dc'd that day stable and no complaints To ED yesterday with abd pain CT showing renal hematoma Hg slightly decreased TRH asking for evaluation for embolization/need MD did discuss  findings with Dr Vernard Gambles. He has reviewed imaging and chart I have seen and examined pt Rec: follow for now; for angio/embolization if clear progression of hematoma No indication for hematoma drain  I have discussed with pt and sister Vanita Ingles at bedside They have good understanding of this plan And are agreeable.   Thank you for this interesting consult.  I greatly enjoyed meeting Krista Strickland and look forward to participating in their care.  A copy of this report was sent to the requesting provider on this date.  Electronically Signed: Lavonia Drafts, PA-C 10/12/2020, 10:56 AM   I spent a total of 40 Minutes    in face to face in clinical consultation, greater than 50% of which was counseling/coordinating care for renal hematoma- possible embolization if need

## 2020-10-12 NOTE — Plan of Care (Signed)
  Problem: Acute Rehab OT Goals (only OT should resolve) Goal: Pt. Will Perform Grooming Flowsheets (Taken 10/12/2020 1223) Pt Will Perform Grooming:  with modified independence  standing  with adaptive equipment Goal: Pt. Will Transfer To Toilet Flowsheets (Taken 10/12/2020 1223) Pt Will Transfer to Toilet:  with modified independence  stand pivot transfer Goal: Pt/Caregiver Will Perform Home Exercise Program Flowsheets (Taken 10/12/2020 1223) Pt/caregiver will Perform Home Exercise Program:  Increased strength  Both right and left upper extremity  With Supervision  Sherlonda Flater OT, MOT

## 2020-10-12 NOTE — TOC Initial Note (Signed)
Transition of Care [facility]) - Initial/Assessment Note    Patient Details  Name: Krista Strickland MRN: [id number] Date of Birth: 05/06/1950  [facility]) CM/SW Contact:    Boneta Lucks, RN Phone Number: 10/12/2020, 2:55 PM  Clinical Narrative: Patient admitted with Abdominal pain. PT is recommending SNF vs HH depending on family. TOC spoke with patient, she wants to go home with with home health and rolling walker.  Her sister took the phone and states their house is completely packed with old things, furniture, clothes and things they have collected. She states a walker will not go through the paths they have. She want to talk with patient husband to see if he will let her go to SNF, he will not let anyone in the house. TOC attempting to reach husband. TOC to follow.                   Expected Discharge Plan: [place] Barriers to Discharge: Continued Medical Work up  Patient Goals and CMS Choice Patient states their goals for this hospitalization and ongoing recovery are::  (to go home.) CMS Medicare.gov Compare Post Acute Care list provided to:: Patient Choice offered to / list presented to : Patient  Expected Discharge Plan and Services Expected Discharge Plan: [place] Acute Care Choice: Home Health, Durable Medical Equipment Living arrangements for the past 2 months: Single Family Home     Prior Living Arrangements/Services Living arrangements for the past 2 months: Single Family Home Lives with:: Spouse Patient language and need for interpreter reviewed:: Yes Do you feel safe going back to the place where you live?: Yes      Need for Family Participation in Patient Care: Yes (Comment) Care giver support system in place?: Yes (comment)   Criminal Activity/Legal Involvement Pertinent to Current Situation/Hospitalization: No - Comment as needed  Activities of Daily Living   Permission Sought/Granted Permission  sought to share information with : Case Manager      Permission granted to share info w Relationship: husband and sister    Emotional Assessment    Affect (typically observed): Pleasant Orientation: : Oriented to Self, Oriented to Place, Oriented to  Time, Oriented to Situation Alcohol / Substance Use: Not Applicable Psych Involvement: No (comment)  Admission diagnosis:  Abdominal pain [R10.9] Patient Active Problem List   Diagnosis Date Noted   Leukocytosis 10/12/2020   Abdominal pain 10/26/2020   Renal hematoma, left, initial encounter 10/17/2020   Hyponatremia 10/14/2020   Hypoalbuminemia due to protein-calorie malnutrition ([place]) 10/26/2020   Hyperglycemia due to diabetes mellitus ([place]) 10/28/2020   GERD (gastroesophageal reflux disease) 10/21/2020   Acute kidney injury superimposed on CKD ([place]) 10/16/2020   Pre-op examination 03/17/2020   Corneal edema of left eye 02/05/2020   Sleep-disordered breathing 09/02/2019   Type 2 diabetes mellitus with diabetic chronic kidney disease ([place]) 08/17/2019   Symptomatic anemia 40/98/1191   Diastolic dysfunction 47/82/9562   Anemia in chronic kidney disease 12/18/2018   Hypercalcemia 12/18/2018   Proteinuria 12/18/2018   Secondary hyperparathyroidism ([place]) 12/18/2018   Vitamin D deficiency 12/18/2018   CKD (chronic kidney disease) stage 4, GFR 15-29 ml/min ([place]) 12/18/2018   Hypertensive retinopathy of both eyes 11/14/2018   Nuclear sclerotic cataract of both eyes 11/14/2018   Iritis of left eye 11/14/2018   Diabetic neuropathy ([name]) 11/09/2015   Degenerative spondylolisthesis 09/21/2015   Leg edema 11/30/2014   Lumbar radiculopathy 11/11/2014  Multinodular goiter (nontoxic) 09/20/2013   Hypertriglyceridemia 08/05/2013   Back pain 04/16/2013   CVA (cerebral vascular accident) ([place]) 07/23/2011   SLEEP APNEA 09/06/2009   Hyperuricemia 11/04/2008   Mixed hyperlipidemia 07/02/2007   Obesity (BMI 30.0-34.9) 07/02/2007   Essential  hypertension 07/02/2007   PCP:  Fayrene Helper, MD Pharmacy:   [place], [place] - [address] [place] [id number] [place] [address] Phone: [phone] Fax: [phone]   Readmission Risk Interventions Readmission Risk Prevention Plan 10/12/2020  Transportation Screening Complete  Home Care Screening Complete  Medication Review (RN CM) Complete  Some recent data might be hidden

## 2020-10-12 NOTE — Plan of Care (Signed)
  Problem: Acute Rehab PT Goals(only PT should resolve) Goal: Patient Will Transfer Sit To/From Stand Outcome: Progressing Flowsheets (Taken 10/12/2020 1408) Patient will transfer sit to/from stand: with supervision Goal: Pt Will Transfer Bed To Chair/Chair To Bed Outcome: Progressing Flowsheets (Taken 10/12/2020 1408) Pt will Transfer Bed to Chair/Chair to Bed: with supervision Goal: Pt Will Ambulate Outcome: Progressing Flowsheets (Taken 10/12/2020 1408) Pt will Ambulate:  50 feet  with supervision  with rolling walker Goal: Pt/caregiver will Perform Home Exercise Program Outcome: Progressing Flowsheets (Taken 10/12/2020 1408) Pt/caregiver will Perform Home Exercise Program:  For increased ROM  For increased strengthening  For improved balance  With Supervision, verbal cues required/provided   Talbot Grumbling PT, DPT 10/12/20, 2:08 PM

## 2020-10-12 NOTE — Progress Notes (Signed)
PROGRESS NOTE    Patient: Krista Strickland                            PCP: Fayrene Helper, MD                    DOB: 1950/06/12            DOA: 10/09/2020 [id number]             DOS: 10/12/2020, 1:26 PM   LOS: 0 days   Date of Service: The patient was seen and examined on 10/12/2020  Subjective:   The patient was seen and examined this morning. Stable at this time. Still complaining of :  Otherwise no issues overnight .  Brief Narrative:   Krista Strickland is a 70 y.o. female with medical history significant for CKD stage IV, hypertension, type 2 diabetes mellitus, GERD, hyperlipidemia, HOCM who presents to the emergency department due to abdominal pain which started around 1 PM today.  Pain was sharp in nature, it was constant and was rated as 9/10 on pain scale.  Pain was located left lower quadrant.  She endorsed one loose bowel movement today and was not sure if this was related to eating a larger than usual breakfast around 11 AM this morning.  She recently had renal biopsy on 9/9.  She follows with Dr. Theador Hawthorne due to CKD and was last seen on 8/18.   ED: 171/89, mild leukocytosis, hyponatremia,, BUN to creatinine 93/4.48, hyperglycemia, albumin 3.3.  Influenza A, B, SARS coronavirus 2 was negative.   CT abdomen and pelvis without contrast showed acute 8 x 4 x 12 cm left renal subcapsular hematoma.  Interventional radiologist was consulted and recommended monitoring patient for 24 to 48 hours    Assessment & Plan:   Principal Problem:   Abdominal pain Active Problems:   Mixed hyperlipidemia   Obesity (BMI 30.0-34.9)   Essential hypertension   Anemia in chronic kidney disease   Renal hematoma, left, initial encounter   Hyponatremia   Hypoalbuminemia due to protein-calorie malnutrition (HCC)   Hyperglycemia due to diabetes mellitus ([employer])   Acute kidney injury superimposed on CKD (HCC)   Leukocytosis   Abdominal pain -Still complaining of left quadrant abdominal  pain -CT abdomen and pelvis without contrast showed scattered colonic diverticulosis with no acute diverticulitis..  Also revealing left renal subscapular hematoma Continue IV NS at 75 mLs/Hr Continue IV Dilaudid 0.5 mg q.4h p.r.n. for moderate to severe pain Continue IV Zofran p.r.n. Procalcitonin will be down to rule out infectious process  Leukocytosis possibly reactive vs infective WBC 15.4, procalcitonin 0.34, afebrile, normotensive Withholding antibiotics at this time -No source of infection   Left renal subscapular hematoma CT abdomen and pelvis without contrast showed acute 8 x 4 x 12 cm left renal subcapsular hematoma. Continue to monitor patient for worsening of symptoms Discussed with nephrologist Dr. Othelia Pulling and discussed the case with interventional radiologist Dr. Hassell-recommended close monitoring, no need for transfer to [facility] for possible embolization at this time. -We will monitor H&H closely -We will obtain renal and bladder ultrasound to rule out blood clots -We will monitor for urinary retention, hematuria -CBC every 8 hours  Acute kidney injury CKD stage V/dehydration BUN to creatinine 93/4.48 (baseline creatinine at 2.7 x 3.7) Continue IV hydration Renally adjust medications, avoid nephrotoxic agents/dehydration/hypotension -Avoiding nephrotoxins, appreciate nephrology input  Hyponatremia (chronic) This may be due to  diuretic use..  Serum sodium 127, 126 Continue gentle hydration Continue to monitor sodium with serial BMPs Urine and serum osmolality and urine sodium will be checked  Hyperglycemia secondary to T2DM Continue ISS and hypoglycemic protocol -Monitoring CBG closely  Anemia of chronic disease Hemoglobin at 11.8 (baseline at 12.2-13.4) >>10.8 This is possibly secondary to patient's history of CKD Monitoring H&H every 8 hours  Essential hypertension (uncontrolled) Continue Lopressor -Stable, as needed  hydralazine  Hyperlipidemia Continue Zetia and Pravachol -Stable  Hypoalbuminemia secondary to mild protein calorie malnutrition Protein supplement will be provided  Obesity (BMI; 31.7) Patient counseled on diet lifestyle modification     Consultants:  IR/ Nephrologist    --------------------------------------------------------------------------------------------------------------------------------  DVT prophylaxis:  SCD Code Status:   Code Status: Full Code  Family Communication: Sister at bedside  The above findings and plan of care has been discussed with patient (and family)  in detail,  they expressed understanding and agreement of above. -Advance care planning has been discussed.   Admission status:   Status is: Observation  The patient remains OBS appropriate and will d/c before 2 midnights.  Dispo: The patient is from: Home              Anticipated d/c is to: Home              Patient currently is not medically stable to d/c.   Difficult to place patient No   Level of care: Med-Surg   Procedures:   No admission procedures for hospital encounter.    Antimicrobials:  Anti-infectives (From admission, onward)    None        Medication:   calcitRIOL  0.25 mcg Oral Daily   diltiazem  180 mg Oral Daily   ezetimibe  10 mg Oral Daily   febuxostat  40 mg Oral Daily   feeding supplement (GLUCERNA SHAKE)  237 mL Oral TID BM   gabapentin  300 mg Oral BID   hydrALAZINE  50 mg Oral Q8H   insulin aspart  0-5 Units Subcutaneous QHS   insulin aspart  0-6 Units Subcutaneous TID WC   insulin aspart protamine- aspart  50 Units Subcutaneous Q breakfast   metoprolol tartrate  50 mg Oral BID   pravastatin  20 mg Oral QAC breakfast    hydrALAZINE, HYDROcodone-acetaminophen, HYDROmorphone (DILAUDID) injection, ondansetron (ZOFRAN) IV   Objective:   Vitals:   10/12/20 1145 10/12/20 1200 10/12/20 1230 10/12/20 1300  BP: (!) 220/90 (!) 198/97 (!) 201/89 (!)  203/84  Pulse: 79 76 73 74  Resp:      Temp:      TempSrc:      SpO2: 93% 90% 93% 93%    Intake/Output Summary (Last 24 hours) at 10/12/2020 1326 Last data filed at 10/12/2020 1115 Gross per 24 hour  Intake 762.48 ml  Output --  Net 762.48 ml   There were no vitals filed for this visit.   Examination:   Physical Exam  Constitution:  Alert, cooperative, no distress,  Appears calm and comfortable  Psychiatric: Normal and stable mood and affect, cognition intact,   HEENT: Normocephalic, PERRL, otherwise with in Normal limits  Chest:Chest symmetric Cardio vascular:  S1/S2, RRR, No murmure, No Rubs or Gallops  pulmonary: Clear to auscultation bilaterally, respirations unlabored, negative wheezes / crackles Abdomen: Soft, flank pain, tenderness on the left side, non-distended, bowel sounds,no masses, no organomegaly Muscular skeletal: Limited exam - in bed, able to move all 4 extremities, Normal strength,  Neuro: CNII-XII intact. ,  normal motor and sensation, reflexes intact  Extremities: No pitting edema lower extremities, +2 pulses  Skin: Dry, warm to touch, negative for any Rashes, No open wounds Wounds: per nursing documentation  -----------------------------------------------------------------------------------------------------------------------------    LABs:  CBC Latest Ref Rng & Units 10/12/2020 10/12/2020 09/29/2020  WBC 4.0 - 10.5 K/uL 13.3(H) 14.7(H) 15.4(H)  Hemoglobin 12.0 - 15.0 g/dL 10.1(L) 10.8(L) 11.8(L)  Hematocrit 36.0 - 46.0 % 31.3(L) 32.6(L) 35.7(L)  Platelets 150 - 400 K/uL 216 244 270   CMP Latest Ref Rng & Units 10/12/2020 10/18/2020 09/22/2020  Glucose 70 - 99 mg/dL 54(L) 139(H) 176(H)  BUN 8 - 23 mg/dL 94(H) 93(H) 90(H)  Creatinine 0.44 - 1.00 mg/dL 4.46(H) 4.48(H) 3.68(H)  Sodium 135 - 145 mmol/L 126(L) 127(L) 126(L)  Potassium 3.5 - 5.1 mmol/L 4.5 4.8 4.6  Chloride 98 - 111 mmol/L 91(L) 94(L) 92(L)  CO2 22 - 32 mmol/L 23 21(L) 23  Calcium 8.9 - 10.3  mg/dL 9.1 9.2 9.0  Total Protein 6.5 - 8.1 g/dL 6.4(L) 7.0 -  Total Bilirubin 0.3 - 1.2 mg/dL 0.5 0.4 -  Alkaline Phos 38 - 126 U/L 76 82 -  AST 15 - 41 U/L 24 39 -  ALT 0 - 44 U/L 34 43 -       Micro Results Recent Results (from the past 240 hour(s))  Resp Panel by RT-PCR (Flu A&B, Covid) Nasopharyngeal Swab     Status: None   Collection Time: 10/24/2020  9:07 PM   Specimen: Nasopharyngeal Swab; Nasopharyngeal(NP) swabs in vial transport medium  Result Value Ref Range Status   SARS Coronavirus 2 by RT PCR NEGATIVE NEGATIVE Final    Comment: (NOTE) SARS-CoV-2 target nucleic acids are NOT DETECTED.  The SARS-CoV-2 RNA is generally detectable in upper respiratory specimens during the acute phase of infection. The lowest concentration of SARS-CoV-2 viral copies this assay can detect is 138 copies/mL. A negative result does not preclude SARS-Cov-2 infection and should not be used as the sole basis for treatment or other patient management decisions. A negative result may occur with  improper specimen collection/handling, submission of specimen other than nasopharyngeal swab, presence of viral mutation(s) within the areas targeted by this assay, and inadequate number of viral copies(<138 copies/mL). A negative result must be combined with clinical observations, patient history, and epidemiological information. The expected result is Negative.  Fact Sheet for Patients:  EntrepreneurPulse.com.au  Fact Sheet for Healthcare Providers:  IncredibleEmployment.be  This test is no t yet approved or cleared by the [place] FDA and  has been authorized for detection and/or diagnosis of SARS-CoV-2 by FDA under an Emergency Use Authorization (EUA). This EUA will remain  in effect (meaning this test can be used) for the duration of the COVID-19 declaration under Section 564(b)(1) of the Act, 21 U.S.C.section 360bbb-3(b)(1), unless the authorization is  terminated  or revoked sooner.       Influenza A by PCR NEGATIVE NEGATIVE Final   Influenza B by PCR NEGATIVE NEGATIVE Final    Comment: (NOTE) The Xpert Xpress SARS-CoV-2/FLU/RSV plus assay is intended as an aid in the diagnosis of influenza from Nasopharyngeal swab specimens and should not be used as a sole basis for treatment. Nasal washings and aspirates are unacceptable for Xpert Xpress SARS-CoV-2/FLU/RSV testing.  Fact Sheet for Patients: EntrepreneurPulse.com.au  Fact Sheet for Healthcare Providers: IncredibleEmployment.be  This test is not yet approved or cleared by the [place] FDA and has been authorized for detection and/or diagnosis of SARS-CoV-2 by FDA  under an Emergency Use Authorization (EUA). This EUA will remain in effect (meaning this test can be used) for the duration of the COVID-19 declaration under Section 564(b)(1) of the Act, 21 U.S.C. section 360bbb-3(b)(1), unless the authorization is terminated or revoked.  Performed at [facility], [address], [place], [place] [address]     Radiology Reports CT Abdomen Pelvis Wo Contrast  Result Date: 10/26/2020 CLINICAL DATA:  abdominal pain that began today Pt had renal biopsy 3 days ago EXAM: CT ABDOMEN AND PELVIS WITHOUT CONTRAST TECHNIQUE: Multidetector CT imaging of the abdomen and pelvis was performed following the standard protocol without IV contrast. COMPARISON:  CT lumbar spine 09/23/2019, CT abdomen pelvis 07/22/2019 FINDINGS: Lower chest: Redemonstration of calcified right lower lobe pulmonary nodules (4:14, 8). Similar finding on the left in the lower lobe (4:4). Hepatobiliary: No focal liver abnormality. No gallstones, gallbladder wall thickening, or pericholecystic fluid. No biliary dilatation. Pancreas: No focal lesion. Normal pancreatic contour. No surrounding inflammatory changes. No main pancreatic ductal dilatation. Spleen: Normal in size without focal  abnormality. Adrenals/Urinary Tract: No adrenal nodule bilaterally. Left perinephric fat stranding. There is a hyperdense 8 x 4 x 12cm subcapsular hematoma along the left kidney leading to mass effect on the renal parenchyma. No nephrolithiasis and no hydronephrosis. No definite contour-deforming renal mass. No ureterolithiasis or hydroureter. The urinary bladder is unremarkable. Stomach/Bowel: PO contrast reaches the transverse colon. Stomach is within normal limits. No evidence of bowel wall thickening or dilatation. Diffuse sigmoid diverticulosis. Appendix appears normal. Vascular/Lymphatic: No abdominal aorta or iliac aneurysm. Severe atherosclerotic plaque of the aorta and its branches. No abdominal, pelvic, or inguinal lymphadenopathy. Reproductive: There is a 2.8 cm right adnexal cystic lesion. The uterus is not well visualized and likely surgically removed. The left adnexal region is unremarkable. Other: Hyperdense free fluid noted coursing into the pelvis no intraperitoneal free fluid. No intraperitoneal free gas. No organized fluid collection. Musculoskeletal: Small fat containing umbilical hernia. No suspicious lytic or blastic osseous lesions. No acute displaced fracture. Multilevel degenerative changes of the spine. Grade 1 anterolisthesis of L4 on L5. IMPRESSION: 1. Acute 8 x 4 x12 cm left renal subcapsular hematoma. 2. Scattered colonic diverticulosis with no acute diverticulitis. 3. Stable bilateral lower lobe calcified pulmonary nodules. 4. A 2.8 cm right adnexal cystic lesion. No follow-up imaging recommended. Note: This recommendation does not apply to premenarchal patients and to those with increased risk (genetic, family history, elevated tumor markers or other high-risk factors) of ovarian cancer. Reference: JACR 2020 Feb; 17(2):248-254. 5.  Aortic Atherosclerosis (ICD10-I70.0). These results were called by telephone at the time of interpretation on 10/21/2020 at 8:42 pm to provider [facility] , who verbally acknowledged these results. Electronically Signed   By: Iven Finn M.D.   On: 10/16/2020 20:53   US RENAL  Result Date: 10/12/2020 CLINICAL DATA:  AK I on CKD, history of left kidney biopsy 10/07/2020 EXAM: RENAL / URINARY TRACT ULTRASOUND COMPLETE COMPARISON:  CT abdomen/pelvis 1 day prior FINDINGS: Right Kidney: Renal measurements: 10.4 cm x 5.4 cm x 5.3 cm = volume: 155 mL. Echogenicity within normal limits. No mass or hydronephrosis visualized. Left Kidney: Renal measurements: 12.8 cm x 6.2 cm x 6.0 cm = volume: 249 mL. There is a subcapsular hematoma along the left kidney measuring approximately 10.6 cm x 2.8 cm x 6.5 cm, as seen on the CT obtained 1 day prior. Bladder: Appears normal for degree of bladder distention. Other: None. IMPRESSION: Subcapsular hematoma along the left kidney,  grossly similar in size allowing for difference in modality. Electronically Signed   By: Valetta Mole M.D.   On: 10/12/2020 11:15   US BIOPSY (KIDNEY)  Result Date: 10/07/2020 INDICATION: 70 year old female with a history of proteinuria referred for medical renal biopsy EXAM: IMAGE GUIDED MEDICAL RENAL BIOPSY MEDICATIONS: None. ANESTHESIA/SEDATION: Moderate (conscious) sedation was employed during this procedure. A total of Versed 1.0 mg and Fentanyl 50 mcg was administered intravenously. Moderate Sedation Time: 10 minutes. The patient's level of consciousness and vital signs were monitored continuously by radiology nursing throughout the procedure under my direct supervision. FLUOROSCOPY TIME:  Ultrasound COMPLICATIONS: None PROCEDURE: Informed written consent was obtained from the patient after a thorough discussion of the procedural risks, benefits and alternatives. All questions were addressed. Maximal Sterile Barrier Technique was utilized including caps, mask, sterile gowns, sterile gloves, sterile drape, hand hygiene and skin antiseptic. A timeout was performed prior to the initiation of  the procedure. Patient was positioned prone position on the gantry table. Images were stored sent to PACs. Once the patient is prepped and draped in the usual sterile fashion, the skin and subcutaneous tissues overlying the left kidney were generously infiltrated 1% lidocaine for local anesthesia. Using ultrasound guidance, a 15 gauge guide needle was advanced into the lower cortex of the left kidney. Once we confirmed location of the needle tip, 2 separate 16 gauge core biopsy were achieved. Two Gel-Foam pledgets were infused with a small amount of saline. The needle was removed. Final images were stored. The patient tolerated the procedure well and remained hemodynamically stable throughout. No complications were encountered and no significant blood loss encountered. IMPRESSION: Status post ultrasound-guided medical renal biopsy. Signed, Dulcy Fanny. Dellia Nims, RPVI [facility] Radiology Electronically Signed   By: Corrie Mckusick D.O.   On: 10/07/2020 10:11    SIGNED: Deatra James, MD, FHM. [facility],  Pager (please use [email or web address] to page/text) Please use Epic Secure Chat for non-urgent communication (7AM-7PM)  If 7PM-7AM, please contact night-coverage [email or web address], 10/12/2020, 1:26 PM

## 2020-10-12 NOTE — ED Notes (Signed)
Pt drank OJ and ate some fruit to increase glucose.

## 2020-10-12 NOTE — Progress Notes (Signed)
Inpatient Diabetes Program Recommendations  AACE/ADA: New Consensus Statement on Inpatient Glycemic Control (2015)  Target Ranges:  Prepandial:   less than 140 mg/dL      Peak postprandial:   less than 180 mg/dL (1-2 hours)      Critically ill patients:  140 - 180 mg/dL   Lab Results  Component Value Date   GLUCAP 265 (H) 10/12/2020   HGBA1C 7.2 (A) 09/29/2020    Review of Glycemic Control  Diabetes history: DM2 Outpatient Diabetes medications: 70/30 105 units q am Current orders for Inpatient glycemic control: 70/30 50 units qd, Novolog 0-6 units correction tid , 0-5 units hs  Inpatient Diabetes Program Recommendations:   Patient sees Dr. Renato Shin for endocrinology and last office visit was 09/29/20. Will follow during hospitalization. Patient wears dexcom 6 sensor @ home.  Thank you, Nani Gasser. Jency Schnieders, RN, MSN, CDE  Diabetes Coordinator Inpatient Glycemic Control Team Team Pager [phone] (8am-5pm) 10/12/2020 2:06 PM

## 2020-10-12 NOTE — Evaluation (Signed)
Occupational Therapy Evaluation Patient Details Name: Krista Strickland MRN: [id number] DOB: 09/27/1950 Today's Date: 10/12/2020   History of Present Illness Krista Strickland is a 70 y.o. female with medical history significant for CKD stage IV, hypertension, type 2 diabetes mellitus, GERD, hyperlipidemia, HOCM who presents to the emergency department due to abdominal pain which started around 1 PM today.  Pain was sharp in nature, it was constant and was rated as 9/10 on pain scale.  Pain was located left lower quadrant.  She endorsed one loose bowel movement today and was not sure if this was related to eating a larger than usual breakfast around 11 AM this morning.  She recently had renal biopsy on 9/9.  She follows with Dr. Theador Hawthorne due to CKD and was last seen on 8/18.   Clinical Impression   Pt agreeable to OT evaluation. Pt demonstrates slow labored movement and overall weakness. Pt requires SPV assist for bed mobility and Min G to mid A for stand pivot transfer with pt seeking single hand held assist and leaning on furniture. Pt reports she does not use AD at baseline. Baseline status difficulty to identify due to pt mixed reports of minimal mobility to walking a mile a day prior to injury. Pt was able to doff and don a sock seated in a chair with extended time. Pt reports that husband is home 24/7 but is unable to assist her. Pt will benefit from continued OT in the hospital and recommended venue below to increase strength, balance, and endurance for safe ADL's.        Recommendations for follow up therapy are one component of a multi-disciplinary discharge planning process, led by the attending physician.  Recommendations may be updated based on patient status, additional functional criteria and insurance authorization.   Follow Up Recommendations  Home health OT;Supervision - Intermittent;Other (comment) (Home health with SPV for mobility and transfers. Pt reports that husband is unable to  provide this level of support. Sister reports she does not plan to stay and assist. If not availble, pt would benefit from SNF placement.)    Equipment Recommendations  Tub/shower bench;3 in 1 bedside commode           Precautions / Restrictions Precautions Precautions: Fall Restrictions Weight Bearing Restrictions: No      Mobility Bed Mobility Overal bed mobility: Needs Assistance Bed Mobility: Supine to Sit;Sit to Supine     Supine to sit: Supervision Sit to supine: Supervision   General bed mobility comments: slow labored movement    Transfers Overall transfer level: Needs assistance   Transfers: Sit to/from Stand;Stand Pivot Transfers Sit to Stand: Min guard;Min assist Stand pivot transfers: Min guard;Min assist       General transfer comment: single hand held assist needed PRN; pt leaning on bed and chair during transfer. Slow labored movement.    Balance Overall balance assessment: Needs assistance Sitting-balance support: Feet supported;No upper extremity supported Sitting balance-Leahy Scale: Good Sitting balance - Comments: at EOB   Standing balance support: Single extremity supported;During functional activity Standing balance-Leahy Scale: Fair Standing balance comment: single hand held assist and leaning on furniture                           ADL either performed or assessed with clinical judgement   ADL Overall ADL's : Needs assistance/impaired  Lower Body Dressing: Supervision/safety;Min guard;Sitting/lateral leans Lower Body Dressing Details (indicate cue type and reason): Pt able to doff and don sock seated in chair. 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; pt reports she is due to have a procedure  but was unsure of what the issue was.) Ability to See in Adequate Light: 0 Adequate Patient Visual Report: No change from baseline           Hand Dominance Right   Extremity/Trunk Assessment Upper Extremity Assessment Upper Extremity Assessment: Generalized weakness   Lower Extremity Assessment Lower Extremity Assessment: Defer to PT evaluation   Cervical / Trunk Assessment Cervical / Trunk Assessment: Normal   Communication Communication Communication: No difficulties   Cognition Arousal/Alertness: Awake/alert Behavior During Therapy: WFL for tasks assessed/performed Overall Cognitive Status: Within Functional Limits for tasks assessed                                                      Home Living Family/patient expects to be discharged to:: Private residence Living Arrangements: Spouse/significant other Available Help at Discharge: Family;Available PRN/intermittently (Husband present 24/7 but unable to assist. Sister is present PRN to assist but reports she plans to leave soon.) Type of Home: House Home Access: Ramped entrance     Home Layout: One level     Bathroom Shower/Tub: Teacher, early years/pre: Handicapped height     Home Equipment: None          Prior Functioning/Environment Level of Independence: Needs assistance  Gait / Transfers Assistance Needed: Pt reports independent household and community mobility, but pt also stated she typically leans on furniture during  mobility. ADL's / Homemaking Assistance Needed: Pt reports independent ADL's and IADL's, but sister reported that she does the cooking.   Comments: Pt reports independent with ADLs/IADLs, community ambulation, reports ~3 falls since Christmas from rolling out of bed        OT Problem List: Decreased strength;Decreased activity tolerance;Impaired balance (sitting and/or standing)      OT Treatment/Interventions: Self-care/ADL training;Therapeutic  exercise;Patient/family education;Balance training;Therapeutic activities;DME and/or AE instruction    OT Goals(Current goals can be found in the care plan section) Acute Rehab OT Goals Patient Stated Goal: return home OT Goal Formulation: With patient Time For Goal Achievement: 10/26/20 Potential to Achieve Goals: Good  OT Frequency: Min 2X/week    AM-PAC OT "6 Clicks" Daily Activity     Outcome Measure Help from another person eating meals?: None Help from another person taking care of personal grooming?: A Little Help from another person toileting, which includes using toliet, bedpan, or urinal?: A Little Help from another person bathing (including washing, rinsing, drying)?: None Help from another person to put on and taking off regular upper body clothing?: None Help from another person to put on and taking off regular lower body clothing?: A Little 6 Click Score: 21   End of Session    Activity Tolerance: Patient tolerated treatment well Patient left: in bed;with call bell/phone within reach;with family/visitor present  OT Visit Diagnosis: Unsteadiness on feet (R26.81);History of falling (Z91.81);Muscle weakness (generalized) (M62.81)  Time: 3875-6433 OT Time Calculation (min): 27 min Charges:  OT General Charges $OT Visit: 1 Visit OT Evaluation $OT Eval Low Complexity: 1 Low  Disa Riedlinger OT, MOT  Larey Seat 10/12/2020, 12:20 PM

## 2020-10-12 NOTE — Consult Note (Signed)
[facility] Nephrology Consultation Note  Requesting MD: Dr Skipper Cliche Reason for consult: AKI on CKD  HPI:  Krista Strickland is a 70 y.o. female with history of hypertension, diabetes, anemia, secondary hyperparathyroidism, stroke, hypertrophic obstructive cardiomyopathy, HLD, obesity, uterine cancer, CKD stage IV, seen as a consultation for the evaluation of AKI on CKD 4. Patient has longstanding CKD with baseline creatinine level seems to be around 2.6-2.9, sub-nephrotic range proteinuria follows closely with Dr. Theador Hawthorne, last seen on 8/18.  CKD diagnosed since 2008 and it was thought to be due to diabetes and hypertension.  Recently patient has rapid decline in GFR with unremarkable serologies including negative ANA, ANCA, complements.  The creatinine level was around 3.6 in 08/2020 at outpatient.  The patient underwent random kidney biopsy by IR at [facility] on 9/9.  The biopsy was uneventful.  The patient presented back to the ER on 9/13 with worsening left-sided abdominal pain. In the ER she was found to be hypertensive.  CT abdomen pelvis showed left renal 8 x 4 x 12 cm subcapsular hematoma leading to mass-effect on the renal parenchyma.  The urinary bladder was unremarkable.  Interventional radiologist was contacted who recommended to monitor patient in the hospital.  The labs showed elevated creatinine level up to 4.48 from 3.6 on 8/25 as outpatient. Treated with gentle IV hydration.  Noted hemoglobin dropped to 10.1 from 13.4 on 9/9. The patient said that she is not urinating much but denies any hematuria, dysuria, urgency or pelvic pain.  She continues to have some left-sided abdominal pain.  Denies nausea, vomiting, chest pain, shortness of breath.  She was seen and examined in ER.  Her sister was at bedside.  Creat   Creatinine, Ser  Date/Time Value Ref Range Status  10/12/2020 04:22 AM 4.46 (H) 0.44 - 1.00 mg/dL Final  10/17/2020 05:38 PM 4.48 (H) 0.44 - 1.00 mg/dL  Final  09/22/2020 01:13 PM 3.68 (H) 0.44 - 1.00 mg/dL Final  09/08/2020 11:30 AM 3.66 (H) 0.44 - 1.00 mg/dL Final  07/28/2020 12:09 PM 2.92 (H) 0.44 - 1.00 mg/dL Final  06/30/2020 01:59 PM 2.88 (H) 0.44 - 1.00 mg/dL Final  06/02/2020 01:33 PM 2.70 (H) 0.44 - 1.00 mg/dL Final  05/05/2020 03:26 PM 2.68 (H) 0.44 - 1.00 mg/dL Final  04/21/2020 03:37 PM 2.77 (H) 0.44 - 1.00 mg/dL Final  03/17/2020 04:24 PM 2.25 (H) 0.57 - 1.00 mg/dL Final  02/25/2020 03:41 PM 2.29 (H) 0.44 - 1.00 mg/dL Final  12/11/2019 02:24 PM 2.00 (H) 0.44 - 1.00 mg/dL Final  11/12/2019 02:24 PM 1.97 (H) 0.44 - 1.00 mg/dL Final  10/29/2019 02:17 PM 1.71 (H) 0.44 - 1.00 mg/dL Final  09/23/2019 01:44 PM 1.85 (H) 0.44 - 1.00 mg/dL Final  09/17/2019 02:32 PM 2.10 (H) 0.44 - 1.00 mg/dL Final  07/23/2019 04:58 AM 2.77 (H) 0.44 - 1.00 mg/dL Final  07/22/2019 01:12 PM 3.05 (H) 0.44 - 1.00 mg/dL Final  12/31/2018 03:39 PM 1.94 (H) 0.40 - 1.20 mg/dL Final  09/07/2017 09:31 AM 2.39 (H) 0.57 - 1.00 mg/dL Final  11/30/2014 08:40 AM 1.47 (H) 0.44 - 1.00 mg/dL Final  04/16/2012 10:10 AM 1.20 (H) 0.50 - 1.10 mg/dL Final  03/04/2012 05:49 PM 1.40 (H) 0.50 - 1.10 mg/dL Final  09/13/2011 09:36 AM 1.69 (H) 0.50 - 1.10 mg/dL Final  06/01/2010 06:29 AM 1.49 DELTA CHECK NOTED (H) 0.4 - 1.2 mg/dL Final  05/31/2010 05:46 AM 2.3 (H) 0.4 - 1.2 mg/dL Final  05/30/2010 04:06 AM 2.28 (H)  0.4 - 1.2 mg/dL Final  05/29/2010 06:49 PM 2.11 (H) 0.4 - 1.2 mg/dL Final  02/27/2010 11:08 PM 1.56 (H) 0.40 - 1.20 mg/dL Final  11/03/2009 06:07 PM 1.97 (H) 0.40 - 1.20 mg/dL Final  04/29/2009 06:32 PM 1.48 (H) 0.40 - 1.20 mg/dL Final  09/17/2008 10:24 PM 1.43 (H) 0.40 - 1.20 mg/dL Final  02/06/2008 08:01 PM 1.55 (H) 0.40 - 1.20 mg/dL Final  08/29/2007 08:46 PM 1.48 (H) 0.40 - 1.20 mg/dL Final  02/27/2007 08:01 PM 1.38 (H) 0.40 - 1.20 mg/dL Final  06/03/2006 08:20 PM 1.55 (H) 0.40 - 1.20 mg/dL Final  05/28/2006 11:43 PM 1.86 (H) 0.40 - 1.20 mg/dL Final   04/12/2006 09:52 PM 1.08 0.40 - 1.20 mg/dL Final     PMHx:   Past Medical History:  Diagnosis Date   Anemia    Arthritis    CKD (chronic kidney disease) stage 3, GFR 30-59 ml/min (HCC)    Diabetes mellitus, type 2 ([place])    Essential hypertension    GERD (gastroesophageal reflux disease)    Gout    History of MRSA infection 03/2009   History of stroke 2013   HOCM (hypertrophic obstructive cardiomyopathy) ([place])    Hypercalcemia 2017   Managed by nephrology   Hyperlipidemia    Obesity    Psoriasis    Uterine cancer ([place])    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    Past Surgical History:  Procedure Laterality Date   ABDOMINAL HYSTERECTOMY     APPENDECTOMY  2012   COLONOSCOPY WITH PROPOFOL N/A 08/24/2016   Procedure: COLONOSCOPY WITH PROPOFOL;  Surgeon: Rogene Houston, MD;  [facility]: [facility];  Service: Endoscopy;  Laterality: N/A;  10:30   Excison of right breast cyst     LAPAROSCOPIC SALPINGO OOPHERECTOMY Right 04/22/2012   Procedure: LAPAROSCOPIC SALPINGO OOPHORECTOMY;  Surgeon: Jonnie Kind, MD;  Location: [facility];  Service: Gynecology;  Laterality: Right;  end 11:17   MASS EXCISION N/A 04/22/2012   Procedure: EXCISION SKIN TAGS NECK AND HEAD;  Surgeon: Jonnie Kind, MD;  Location: [facility];  Service: Gynecology;  Laterality: N/A;  start 11:19   PARTIAL HYSTERECTOMY     POLYPECTOMY  08/24/2016   Procedure: POLYPECTOMY;  Surgeon: Rogene Houston, MD;  Location: [facility];  Service: Endoscopy;;  colon   Right neck biopsy      Family Hx:  Family History  Problem Relation Age of Onset   Hypertension Brother    Gout Brother    Prostate cancer Brother    Hypertension Brother    Prostate cancer Brother    Gout Brother    Hypertension Sister    Gout Sister    Leukemia Sister 51   Pancreatic cancer Sister 41   Gout Sister    Prostate cancer Brother    Diabetes Neg Hx     Social History:  reports that she quit smoking about 42 years ago. She has a 3.00  pack-year smoking history. She has never used smokeless tobacco. She reports that she does not drink alcohol and does not use drugs.  Allergies:  Allergies  Allergen Reactions   Benazepril Swelling   Metronidazole Hives   Mobic [Meloxicam] Hives   Penicillins Hives and Swelling   Sulfonamide Derivatives Hives    Medications: Prior to Admission medications   Medication Sig Start Date End Date Taking? Authorizing Provider  aspirin EC 81 MG tablet Take 1 tablet (81 mg total) by mouth daily. Swallow whole. Patient  taking differently: Take 325 mg by mouth daily. Swallow whole. 09/13/20  Yes Satira Sark, MD  calcitRIOL (ROCALTROL) 0.25 MCG capsule Take 0.25 mcg by mouth 3 (three) times a week.   Yes [provider]  cyclobenzaprine (FLEXERIL) 10 MG tablet TAKE 1 TABLET BY MOUTH  EVERY NIGHT AT  BEDTIME AS NEEDED FOR SPASM Patient taking differently: Take 10 mg by mouth at bedtime as needed for muscle spasms. 08/15/20  Yes Sanjuana Kava, MD  DILT-XR 180 MG 24 hr capsule Take 1 capsule by mouth once daily Patient taking differently: Take 180 mg by mouth daily. 09/05/20  Yes Fayrene Helper, MD  epoetin alfa (EPOGEN) 3000 UNIT/ML injection Inject 3,000 Units into the vein every 14 (fourteen) days.    Yes Bhutani, Manpreet S, MD  ezetimibe (ZETIA) 10 MG tablet Take 1 tablet (10 mg total) by mouth daily. 11/12/19  Yes Fayrene Helper, MD  febuxostat (ULORIC) 40 MG tablet One daily for gout Patient taking differently: Take 40 mg by mouth daily. One daily for gout 06/14/20  Yes Sanjuana Kava, MD  gabapentin (NEURONTIN) 600 MG tablet Take 300 mg by mouth 2 (two) times daily. 03/21/20  Yes [provider]  hydrALAZINE (APRESOLINE) 25 MG tablet TAKE 1 TABLET BY MOUTH TWICE DAILY (AT  9:00  AM  AND  9:00  PM) Patient taking differently: Take 25 mg by mouth in the morning and at bedtime. 08/15/20  Yes Fayrene Helper, MD  HYDROcodone-acetaminophen (NORCO/VICODIN) 5-325 MG  tablet One tablet every six hours for pain.  Limit 7 days. Patient taking differently: Take 1 tablet by mouth every 6 (six) hours as needed for severe pain. Limit 7 days. 09/13/20  Yes Sanjuana Kava, MD  insulin NPH-regular Human (NOVOLIN 70/30 RELION) (70-30) 100 UNIT/ML injection Inject 105 Units into the skin daily with breakfast. 06/29/20  Yes Renato Shin, MD  metoprolol tartrate (LOPRESSOR) 100 MG tablet Take 1 tablet by mouth twice daily Patient taking differently: Take 50 mg by mouth 2 (two) times daily. 05/10/20  Yes Fayrene Helper, MD  potassium chloride (KLOR-CON) 10 MEQ tablet Take 30 mEq by mouth 2 (two) times daily.   Yes [provider]  pravastatin (PRAVACHOL) 20 MG tablet TAKE 1 TABLET BY MOUTH ONCE DAILY BEFORE BREAKFAST Patient taking differently: Take 20 mg by mouth daily. 08/08/20  Yes Fayrene Helper, MD  prednisoLONE acetate (PRED FORTE) 1 % ophthalmic suspension Place 1 drop into the left eye in the morning and at bedtime. 05/20/20  Yes [provider]  torsemide (DEMADEX) 20 MG tablet Take 2.5 tablets (50 mg total) by mouth 2 (two) times daily. 09/13/20  Yes Satira Sark, MD  metolazone (ZAROXOLYN) 5 MG tablet Take 1 tablet by mouth once daily Patient taking differently: Take 5 mg by mouth daily. 08/15/20   Fayrene Helper, MD  [facility] ULTRA test strip USE 1 STRIP TO CHECK GLUCOSE TWICE DAILY 06/20/20   Renato Shin, MD  spironolactone (ALDACTONE) 25 MG tablet Take 75 mg by mouth daily. 06/23/20 06/23/21  [provider]    I have reviewed the patient's current medications.  Labs:  Results for orders placed or performed during the hospital encounter of 10/26/2020 (from the past 48 hour(s))  CBG monitoring, ED     Status: Abnormal   Collection Time: 10/24/2020  5:10 PM  Result Value Ref Range   Glucose-Capillary 129 (H) 70 - 99 mg/dL    Comment: Glucose reference range applies only to  samples taken after fasting for at least 8 hours.   Comprehensive metabolic panel     Status: Abnormal   Collection Time: 10/22/2020  5:38 PM  Result Value Ref Range   Sodium 127 (L) 135 - 145 mmol/L   Potassium 4.8 3.5 - 5.1 mmol/L   Chloride 94 (L) 98 - 111 mmol/L   CO2 21 (L) 22 - 32 mmol/L   Glucose, Bld 139 (H) 70 - 99 mg/dL    Comment: Glucose reference range applies only to samples taken after fasting for at least 8 hours.   BUN 93 (H) 8 - 23 mg/dL   Creatinine, Ser 4.48 (H) 0.44 - 1.00 mg/dL   Calcium 9.2 8.9 - 10.3 mg/dL   Total Protein 7.0 6.5 - 8.1 g/dL   Albumin 3.3 (L) 3.5 - 5.0 g/dL   AST 39 15 - 41 U/L   ALT 43 0 - 44 U/L   Alkaline Phosphatase 82 38 - 126 U/L   Total Bilirubin 0.4 0.3 - 1.2 mg/dL   GFR, Estimated 10 (L) >60 mL/min    Comment: (NOTE) Calculated using the CKD-EPI Creatinine Equation (2021)    Anion gap 12 5 - 15    Comment: Performed at [facility], [address], [place], [place] [address]  Lipase, blood     Status: None   Collection Time: 09/29/2020  5:38 PM  Result Value Ref Range   Lipase 37 11 - 51 U/L    Comment: Performed at [facility], [address], [place], [place] [address]  CBC with Diff     Status: Abnormal   Collection Time: 10/19/2020  5:38 PM  Result Value Ref Range   WBC 15.4 (H) 4.0 - 10.5 K/uL   RBC 4.05 3.87 - 5.11 MIL/uL   Hemoglobin 11.8 (L) 12.0 - 15.0 g/dL   HCT 35.7 (L) 36.0 - 46.0 %   MCV 88.1 80.0 - 100.0 fL   MCH 29.1 26.0 - 34.0 pg   MCHC 33.1 30.0 - 36.0 g/dL   RDW 13.6 11.5 - 15.5 %   Platelets 270 150 - 400 K/uL   nRBC 0.0 0.0 - 0.2 %   Neutrophils Relative % 82 %   Neutro Abs 12.7 (H) 1.7 - 7.7 K/uL   Lymphocytes Relative 8 %   Lymphs Abs 1.2 0.7 - 4.0 K/uL   Monocytes Relative 8 %   Monocytes Absolute 1.3 (H) 0.1 - 1.0 K/uL   Eosinophils Relative 0 %   Eosinophils Absolute 0.1 0.0 - 0.5 K/uL   Basophils Relative 1 %   Basophils Absolute 0.1 0.0 - 0.1 K/uL   Immature Granulocytes 1 %   Abs Immature Granulocytes 0.11 (H) 0.00 - 0.07 K/uL    Comment:  Performed at [facility], [address], [place], [place] [address]  Resp Panel by RT-PCR (Flu A&B, Covid) Nasopharyngeal Swab     Status: None   Collection Time: 10/10/2020  9:07 PM   Specimen: Nasopharyngeal Swab; Nasopharyngeal(NP) swabs in vial transport medium  Result Value Ref Range   SARS Coronavirus 2 by RT PCR NEGATIVE NEGATIVE    Comment: (NOTE) SARS-CoV-2 target nucleic acids are NOT DETECTED.  The SARS-CoV-2 RNA is generally detectable in upper respiratory specimens during the acute phase of infection. The lowest concentration of SARS-CoV-2 viral copies this assay can detect is 138 copies/mL. A negative result does not preclude SARS-Cov-2 infection and should not be used as the sole basis for treatment or other patient management decisions. A  negative result may occur with  improper specimen collection/handling, submission of specimen other than nasopharyngeal swab, presence of viral mutation(s) within the areas targeted by this assay, and inadequate number of viral copies(<138 copies/mL). A negative result must be combined with clinical observations, patient history, and epidemiological information. The expected result is Negative.  Fact Sheet for Patients:  EntrepreneurPulse.com.au  Fact Sheet for Healthcare Providers:  IncredibleEmployment.be  This test is no t yet approved or cleared by the [place] FDA and  has been authorized for detection and/or diagnosis of SARS-CoV-2 by FDA under an Emergency Use Authorization (EUA). This EUA will remain  in effect (meaning this test can be used) for the duration of the COVID-19 declaration under Section 564(b)(1) of the Act, 21 U.S.C.section 360bbb-3(b)(1), unless the authorization is terminated  or revoked sooner.       Influenza A by PCR NEGATIVE NEGATIVE   Influenza B by PCR NEGATIVE NEGATIVE    Comment: (NOTE) The Xpert Xpress SARS-CoV-2/FLU/RSV plus assay is intended as an  aid in the diagnosis of influenza from Nasopharyngeal swab specimens and should not be used as a sole basis for treatment. Nasal washings and aspirates are unacceptable for Xpert Xpress SARS-CoV-2/FLU/RSV testing.  Fact Sheet for Patients: EntrepreneurPulse.com.au  Fact Sheet for Healthcare Providers: IncredibleEmployment.be  This test is not yet approved or cleared by the [place] FDA and has been authorized for detection and/or diagnosis of SARS-CoV-2 by FDA under an Emergency Use Authorization (EUA). This EUA will remain in effect (meaning this test can be used) for the duration of the COVID-19 declaration under Section 564(b)(1) of the Act, 21 U.S.C. section 360bbb-3(b)(1), unless the authorization is terminated or revoked.  Performed at [facility], [address], [place], [place] [address]   CBG monitoring, ED     Status: None   Collection Time: 10/12/20 12:26 AM  Result Value Ref Range   Glucose-Capillary 76 70 - 99 mg/dL    Comment: Glucose reference range applies only to samples taken after fasting for at least 8 hours.  Comprehensive metabolic panel     Status: Abnormal   Collection Time: 10/12/20  4:22 AM  Result Value Ref Range   Sodium 126 (L) 135 - 145 mmol/L   Potassium 4.5 3.5 - 5.1 mmol/L   Chloride 91 (L) 98 - 111 mmol/L   CO2 23 22 - 32 mmol/L   Glucose, Bld 54 (L) 70 - 99 mg/dL    Comment: Glucose reference range applies only to samples taken after fasting for at least 8 hours.   BUN 94 (H) 8 - 23 mg/dL   Creatinine, Ser 4.46 (H) 0.44 - 1.00 mg/dL   Calcium 9.1 8.9 - 10.3 mg/dL   Total Protein 6.4 (L) 6.5 - 8.1 g/dL   Albumin 3.1 (L) 3.5 - 5.0 g/dL   AST 24 15 - 41 U/L   ALT 34 0 - 44 U/L   Alkaline Phosphatase 76 38 - 126 U/L   Total Bilirubin 0.5 0.3 - 1.2 mg/dL   GFR, Estimated 10 (L) >60 mL/min    Comment: (NOTE) Calculated using the CKD-EPI Creatinine Equation (2021)    Anion gap 12 5 - 15    Comment:  Performed at [facility], [address] [place], [place] [address]  CBC     Status: Abnormal   Collection Time: 10/12/20  4:22 AM  Result Value Ref Range   WBC 14.7 (H) 4.0 - 10.5 K/uL   RBC 3.74 (L) 3.87 - 5.11  MIL/uL   Hemoglobin 10.8 (L) 12.0 - 15.0 g/dL   HCT 32.6 (L) 36.0 - 46.0 %   MCV 87.2 80.0 - 100.0 fL   MCH 28.9 26.0 - 34.0 pg   MCHC 33.1 30.0 - 36.0 g/dL   RDW 13.4 11.5 - 15.5 %   Platelets 244 150 - 400 K/uL   nRBC 0.0 0.0 - 0.2 %    Comment: Performed at [facility], [address] [place], [place] [address]  Protime-INR     Status: None   Collection Time: 10/12/20  4:22 AM  Result Value Ref Range   Prothrombin Time 14.3 11.4 - 15.2 seconds   INR 1.1 0.8 - 1.2    Comment: (NOTE) INR goal varies based on device and disease states. Performed at [facility], [address] [place], [place] [address]   APTT     Status: None   Collection Time: 10/12/20  4:22 AM  Result Value Ref Range   aPTT 27 24 - 36 seconds    Comment: Performed at [facility], [address], [place], [place] [address]  Magnesium     Status: None   Collection Time: 10/12/20  4:22 AM  Result Value Ref Range   Magnesium 1.8 1.7 - 2.4 mg/dL    Comment: Performed at [facility], [address], [place], [place] [address]  Phosphorus     Status: Abnormal   Collection Time: 10/12/20  4:22 AM  Result Value Ref Range   Phosphorus 6.2 (H) 2.5 - 4.6 mg/dL    Comment: Performed at [facility], [address] [place], [place] [address]  Procalcitonin - Baseline     Status: None   Collection Time: 10/12/20  4:22 AM  Result Value Ref Range   Procalcitonin 0.34 ng/mL    Comment:        Interpretation: PCT (Procalcitonin) <= 0.5 ng/mL: Systemic infection (sepsis) is not likely. Local bacterial infection is possible. (NOTE)       Sepsis PCT Algorithm           Lower Respiratory Tract                                      Infection PCT Algorithm    ----------------------------      ----------------------------         PCT < 0.25 ng/mL                PCT < 0.10 ng/mL          Strongly encourage             Strongly discourage   discontinuation of antibiotics    initiation of antibiotics    ----------------------------     -----------------------------       PCT 0.25 - 0.50 ng/mL            PCT 0.10 - 0.25 ng/mL               OR       >80% decrease in PCT            Discourage initiation of                                            antibiotics      Encourage discontinuation  of antibiotics    ----------------------------     -----------------------------         PCT >= 0.50 ng/mL              PCT 0.26 - 0.50 ng/mL               AND        <80% decrease in PCT             Encourage initiation of                                             antibiotics       Encourage continuation           of antibiotics    ----------------------------     -----------------------------        PCT >= 0.50 ng/mL                  PCT > 0.50 ng/mL               AND         increase in PCT                  Strongly encourage                                      initiation of antibiotics    Strongly encourage escalation           of antibiotics                                     -----------------------------                                           PCT <= 0.25 ng/mL                                                 OR                                        > 80% decrease in PCT                                      Discontinue / Do not initiate                                             antibiotics  Performed at [facility], [address], [place], [place] [address]   CBG monitoring, ED     Status: Abnormal   Collection Time: 10/12/20  7:14 AM  Result Value Ref Range   Glucose-Capillary 55 (L) 70 - 99 mg/dL  Comment: Glucose reference range applies only to samples taken after fasting for at least 8 hours.  CBG monitoring, ED     Status: None   Collection Time:  10/12/20  7:43 AM  Result Value Ref Range   Glucose-Capillary 74 70 - 99 mg/dL    Comment: Glucose reference range applies only to samples taken after fasting for at least 8 hours.  CBG monitoring, ED     Status: Abnormal   Collection Time: 10/12/20  9:30 AM  Result Value Ref Range   Glucose-Capillary 144 (H) 70 - 99 mg/dL    Comment: Glucose reference range applies only to samples taken after fasting for at least 8 hours.     ROS:  Pertinent items noted in HPI and remainder of comprehensive ROS otherwise negative.  Physical Exam: Vitals:   10/12/20 0830 10/12/20 0900  BP: (!) 187/85 (!) 186/84  Pulse: 66 67  Resp: 13 17  Temp:    SpO2: 96% 98%     General exam: Appears calm and comfortable  Respiratory system: Clear to auscultation. Respiratory effort normal. No wheezing or crackle Cardiovascular system: S1 & S2 heard, RRR.  No pedal edema. Gastrointestinal system: Abdomen is nondistended, soft and nontender. Normal bowel sounds heard. Central nervous system: Alert and oriented. No focal neurological deficits. Extremities: Symmetric 5 x 5 power. Skin: No rashes, lesions or ulcers Psychiatry: Judgement and insight appear normal. Mood & affect appropriate.   Assessment/Plan:  #Acute kidney injury on CKD stage IV: Patient has longstanding CKD with baseline creatinine level seems to be around 2.6-2.9 however recently the GFR declined and creatinine level around 3.6 as outpatient.  The CKD with subnephrotic proteinuria was thought to be due to diabetes and HTN followed by Dr. Theador Hawthorne.  She underwent IR guided kidney biopsy on 9/9 at [facility] and now admitted with subcapsular hematoma and anemia.  The acute worsening in creatinine level probably hemodynamically mediated with some intravascular volume depletion concomitant with decreased nephron mass due to mass-effect of hematoma.  I will start gentle IV hydration.  Management of hematoma and bleeding as discussed below.  Follow-up  the kidney biopsy result with her nephrology. Recommend bladder scan, straight ins and out.  May need urology consult if blood clot/hematuria. Daily lab. No need for dialysis at this time.  #Post biopsy left kidney hematoma: 8 x 4 x 12 cm subcapsular hematoma along the left kidney leading to mass-effect on the renal parenchyma.  IR was consulted, no plan for embolization.  Continue to monitor CBC and imaging studies.  #Hyponatremia: Seems to be chronic.  Asymptomatic.  Check urine sodium, potassium, osmolality.  Gentle IV hydration with NS as above.  #Hypertension: BP elevated to systolic more than 793.  Resume home medication including diltiazem.  I will increase hydralazine to 50 mg 3 times daily.  Management of pain per primary team.  #Anemia of CKD and blood loss postbiopsy: Recommend to check CBC every 6-8 hours.  Discussed with the primary team.  Discussed with the patient, her sister and with the primary team as well. Thank you for the consult we will follow with you.  Abrina Petz Tanna Furry 10/12/2020, 10:20 AM  [employer].

## 2020-10-12 NOTE — Therapy (Signed)
Physical Therapy Evaluation Patient Details Name: Krista Strickland MRN: [id number] DOB: [birth year] Today's Date: 10/12/2020  History of Present Illness  Krista Strickland is a 70 y.o. female presents with abdominal pain, sharp 9/10 in LLQ. Of note, recent renal biopsy 9/9. CT abdomen and pelvis without contrast showed acute 8 x 4 x 12 cm left renal subcapsular hematoma. PMH: CKD, anemia, diabetes, HTN, gout, stroke, hypertrophic obstructive cardiomyopathy, hyperlipidemia, uterine CA, R eye vision loss  Clinical Impression  Pt admitted with above diagnosis. Pt from home, independent at baseline, sister has been staying with pt since renal procedure providing cooking and cleaning assistance. Pt currently requiring min guard/assist to stand and take steps in room, limited due to L knee pain complaints. Pt adamant she is sedentary, but able to get around with rest breaks at home and no difficulty. Recommend 24 hr supv/assist at d/c, HHPT vs SNF pending progress and family support at home. Pt currently with functional limitations due to the deficits listed below (see PT Problem List). Pt will benefit from skilled PT to increase their independence and safety with mobility to allow discharge to the venue listed below.          Recommendations for follow up therapy are one component of a multi-disciplinary discharge planning process, led by the attending physician.  Recommendations may be updated based on patient status, additional functional criteria and insurance authorization.  Follow Up Recommendations Supervision/Assistance - 24 hour (HHPT vs SNF pending progress/family support)    Equipment Recommendations  Rolling walker with 5" wheels    Recommendations for Other Services       Precautions / Restrictions Precautions Precautions: Fall Restrictions Weight Bearing Restrictions: No      Mobility  Bed Mobility Overal bed mobility: Needs Assistance Bed Mobility: Supine to Sit;Sit to Supine   Supine to sit: Supervision Sit to supine: Supervision   General bed mobility comments: slow labored movement to sit up and return to sitting on gurney    Transfers Overall transfer level: Needs assistance Equipment used: Rolling walker (2 wheeled);None Transfers: Sit to/from Stand Sit to Stand: Min guard Stand pivot transfers: Min guard;Min assist  General transfer comment: min guard to power to stand from elevated gurney, performs with RW and without DME  Ambulation/Gait Ambulation/Gait assistance: Min assist  Assistive device: Rolling walker (2 wheeled)   Gait velocity: pt performs standing marching RW, maintains flexed trunk, able to take sidesteps up to [facility], limited by L knee pain limiting ambulation tolerance      Stairs            Wheelchair Mobility    Modified Rankin (Stroke Patients Only)       Balance Overall balance assessment: Needs assistance Sitting-balance support: Feet supported Sitting balance-Leahy Scale: Good Sitting balance - Comments: at EOB   Standing balance support: During functional activity Standing balance-Leahy Scale: Fair Standing balance comment: static without UE support, provided RW for marching/steps       Pertinent Vitals/Pain      Home Living Family/patient expects to be discharged to:: Private residence Living Arrangements: Spouse/significant other Available Help at Discharge: Family;Available PRN/intermittently Type of Home: House Home Access: Ramped entrance     Home Layout: One level Home Equipment: None      Prior Function Level of Independence: Independent   Gait / Transfers Assistance Needed: Pt reports independent household and community mobility, but pt also stated she typically leans on furniture during  mobility.  ADL's / Homemaking Assistance Needed: Pt  reports independent ADL's and IADL's, but sister reported that she does the cooking.  Comments: Pt reports independent with ADLs/IADLs, community  ambulation, reports ~3 falls since Christmas from rolling out of bed     Hand Dominance   Dominant Hand: Right    Extremity/Trunk Assessment   Upper Extremity Assessment Upper Extremity Assessment: Defer to OT evaluation    Lower Extremity Assessment Lower Extremity Assessment: Generalized weakness    Cervical / Trunk Assessment Cervical / Trunk Assessment: Kyphotic  Communication   Communication: No difficulties  Cognition Arousal/Alertness: Awake/alert Behavior During Therapy: WFL for tasks assessed/performed Overall Cognitive Status: Within Functional Limits for tasks assessed     General Comments General comments (skin integrity, edema, etc.): BP 220/90 once returned to bed- NT in room and RN notified    Exercises     Assessment/Plan    PT Assessment Patient needs continued PT services  PT Problem List Decreased strength;Decreased activity tolerance;Decreased balance;Decreased knowledge of use of DME;Cardiopulmonary status limiting activity;Obesity;Pain       PT Treatment Interventions DME instruction;Gait training;Functional mobility training;Therapeutic activities;Therapeutic exercise;Balance training;Patient/family education    PT Goals (Current goals can be found in the Care Plan section)  Acute Rehab PT Goals Patient Stated Goal: "I'm going home" PT Goal Formulation: With patient/family Time For Goal Achievement: 10/26/20 Potential to Achieve Goals: Good    Frequency Min 3X/week   Barriers to discharge        Co-evaluation               AM-PAC PT "6 Clicks" Mobility  Outcome Measure Help needed turning from your back to your side while in a flat bed without using bedrails?: None Help needed moving from lying on your back to sitting on the side of a flat bed without using bedrails?: None Help needed moving to and from a bed to a chair (including a wheelchair)?: A Little Help needed standing up from a chair using your arms (e.g., wheelchair or  bedside chair)?: A Little Help needed to walk in hospital room?: A Lot Help needed climbing 3-5 steps with a railing? : A Lot 6 Click Score: 18    End of Session   Activity Tolerance: Patient tolerated treatment well;Patient limited by pain Patient left: in bed;with call bell/phone within reach;with family/visitor present;with nursing/sitter in room Nurse Communication: Mobility status;Other (comment) (BP) PT Visit Diagnosis: Unsteadiness on feet (R26.81);Other abnormalities of gait and mobility (R26.89);Muscle weakness (generalized) (M62.81)    Time: 2355-7322 PT Time Calculation (min) (ACUTE ONLY): 34 min   Charges:   PT Evaluation $PT Eval Low Complexity: 1 Low PT Treatments $Therapeutic Activity: 8-22 mins         Krista Strickland PT, DPT 10/12/20, 2:06 PM

## 2020-10-12 NOTE — ED Notes (Signed)
Pt alert and drinking ensure plus for low glucose.

## 2020-10-12 NOTE — ED Notes (Signed)
Attempted to call report to ICU.

## 2020-10-12 NOTE — ED Notes (Signed)
Pt has not urinated all shift and pt states that she has tried. Bladder scan shows 999 ml of urine. Dr. Darlyn Chamber.

## 2020-10-13 ENCOUNTER — Inpatient Hospital Stay (HOSPITAL_COMMUNITY): Payer: HMO

## 2020-10-13 DIAGNOSIS — N184 Chronic kidney disease, stage 4 (severe): Secondary | ICD-10-CM

## 2020-10-13 DIAGNOSIS — I421 Obstructive hypertrophic cardiomyopathy: Secondary | ICD-10-CM | POA: Diagnosis not present

## 2020-10-13 DIAGNOSIS — I10 Essential (primary) hypertension: Secondary | ICD-10-CM | POA: Diagnosis not present

## 2020-10-13 DIAGNOSIS — I422 Other hypertrophic cardiomyopathy: Secondary | ICD-10-CM

## 2020-10-13 DIAGNOSIS — I248 Other forms of acute ischemic heart disease: Secondary | ICD-10-CM | POA: Diagnosis not present

## 2020-10-13 DIAGNOSIS — N179 Acute kidney failure, unspecified: Secondary | ICD-10-CM | POA: Diagnosis not present

## 2020-10-13 DIAGNOSIS — N185 Chronic kidney disease, stage 5: Secondary | ICD-10-CM | POA: Diagnosis not present

## 2020-10-13 DIAGNOSIS — R1030 Lower abdominal pain, unspecified: Secondary | ICD-10-CM | POA: Diagnosis not present

## 2020-10-13 LAB — CBC
HCT: 28.6 % — ABNORMAL LOW (ref 36.0–46.0)
HCT: 28.9 % — ABNORMAL LOW (ref 36.0–46.0)
Hemoglobin: 9.5 g/dL — ABNORMAL LOW (ref 12.0–15.0)
Hemoglobin: 9.5 g/dL — ABNORMAL LOW (ref 12.0–15.0)
MCH: 29.2 pg (ref 26.0–34.0)
MCH: 29 pg (ref 26.0–34.0)
MCHC: 33.2 g/dL (ref 30.0–36.0)
MCHC: 32.9 g/dL (ref 30.0–36.0)
MCV: 88 fL (ref 80.0–100.0)
MCV: 88.1 fL (ref 80.0–100.0)
Platelets: 214 10*3/uL (ref 150–400)
Platelets: 220 10*3/uL (ref 150–400)
RBC: 3.25 MIL/uL — ABNORMAL LOW (ref 3.87–5.11)
RBC: 3.28 MIL/uL — ABNORMAL LOW (ref 3.87–5.11)
RDW: 13.6 % (ref 11.5–15.5)
RDW: 13.8 % (ref 11.5–15.5)
WBC: 17 10*3/uL — ABNORMAL HIGH (ref 4.0–10.5)
WBC: 17.6 10*3/uL — ABNORMAL HIGH (ref 4.0–10.5)
nRBC: 0 % (ref 0.0–0.2)
nRBC: 0 % (ref 0.0–0.2)

## 2020-10-13 LAB — GLUCOSE, CAPILLARY
Glucose-Capillary: 147 mg/dL — ABNORMAL HIGH (ref 70–99)
Glucose-Capillary: 214 mg/dL — ABNORMAL HIGH (ref 70–99)
Glucose-Capillary: 175 mg/dL — ABNORMAL HIGH (ref 70–99)
Glucose-Capillary: 168 mg/dL — ABNORMAL HIGH (ref 70–99)

## 2020-10-13 LAB — RENAL FUNCTION PANEL
Albumin: 2.8 g/dL — ABNORMAL LOW (ref 3.5–5.0)
Anion gap: 10 (ref 5–15)
BUN: 97 mg/dL — ABNORMAL HIGH (ref 8–23)
CO2: 20 mmol/L — ABNORMAL LOW (ref 22–32)
Calcium: 8.7 mg/dL — ABNORMAL LOW (ref 8.9–10.3)
Chloride: 94 mmol/L — ABNORMAL LOW (ref 98–111)
Creatinine, Ser: 4.66 mg/dL — ABNORMAL HIGH (ref 0.44–1.00)
GFR, Estimated: 10 mL/min — ABNORMAL LOW (ref >60)
Glucose, Bld: 161 mg/dL — ABNORMAL HIGH (ref 70–99)
Phosphorus: 6.8 mg/dL — ABNORMAL HIGH (ref 2.5–4.6)
Potassium: 5 mmol/L (ref 3.5–5.1)
Sodium: 124 mmol/L — ABNORMAL LOW (ref 135–145)

## 2020-10-13 LAB — BASIC METABOLIC PANEL
Anion gap: 8 (ref 5–15)
BUN: 94 mg/dL — ABNORMAL HIGH (ref 8–23)
CO2: 20 mmol/L — ABNORMAL LOW (ref 22–32)
Calcium: 8.3 mg/dL — ABNORMAL LOW (ref 8.9–10.3)
Chloride: 95 mmol/L — ABNORMAL LOW (ref 98–111)
Creatinine, Ser: 4.32 mg/dL — ABNORMAL HIGH (ref 0.44–1.00)
GFR, Estimated: 10 mL/min — ABNORMAL LOW (ref >60)
Glucose, Bld: 190 mg/dL — ABNORMAL HIGH (ref 70–99)
Potassium: 4.9 mmol/L (ref 3.5–5.1)
Sodium: 123 mmol/L — ABNORMAL LOW (ref 135–145)

## 2020-10-13 LAB — LACTIC ACID, PLASMA: Lactic Acid, Venous: 1 mmol/L (ref 0.5–1.9)

## 2020-10-13 LAB — TROPONIN I (HIGH SENSITIVITY)
Troponin I (High Sensitivity): 100 ng/L (ref <18)
Troponin I (High Sensitivity): 122 ng/L (ref <18)
Troponin I (High Sensitivity): 123 ng/L (ref <18)

## 2020-10-13 LAB — PROCALCITONIN: Procalcitonin: 0.65 ng/mL

## 2020-10-13 LAB — MRSA NEXT GEN BY PCR, NASAL: MRSA by PCR Next Gen: NOT DETECTED

## 2020-10-13 MED ORDER — SODIUM CHLORIDE 1 G PO TABS
1.0000 g | ORAL_TABLET | Freq: Three times a day (TID) | ORAL | Status: DC
  Administered 2020-10-13 – 2020-10-14 (×3): 1 g via ORAL
  Filled 2020-10-13 (×3): qty 1

## 2020-10-13 MED ORDER — GABAPENTIN 100 MG PO CAPS
200.0000 mg | ORAL_CAPSULE | Freq: Two times a day (BID) | ORAL | Status: DC
  Administered 2020-10-13 – 2020-10-14 (×4): 200 mg via ORAL
  Filled 2020-10-13 (×4): qty 2

## 2020-10-13 MED ORDER — LABETALOL HCL 200 MG PO TABS
200.0000 mg | ORAL_TABLET | Freq: Two times a day (BID) | ORAL | Status: DC
  Administered 2020-10-13 (×2): 200 mg via ORAL
  Filled 2020-10-13 (×2): qty 1

## 2020-10-13 MED ORDER — METOPROLOL TARTRATE 50 MG PO TABS: 75.0000 mg | ORAL_TABLET | Freq: Two times a day (BID) | ORAL | Status: DC

## 2020-10-13 MED ORDER — SODIUM CHLORIDE 1 G PO TABS
1.0000 g | ORAL_TABLET | Freq: Two times a day (BID) | ORAL | Status: DC
  Administered 2020-10-13: 1 g via ORAL
  Filled 2020-10-13: qty 1

## 2020-10-13 MED ORDER — FUROSEMIDE 10 MG/ML IJ SOLN
40.0000 mg | Freq: Once | INTRAMUSCULAR | Status: AC
  Administered 2020-10-13: 40 mg via INTRAVENOUS
  Filled 2020-10-13: qty 4

## 2020-10-13 MED ORDER — FUROSEMIDE 10 MG/ML IJ SOLN
40.0000 mg | Freq: Two times a day (BID) | INTRAMUSCULAR | Status: AC
Start: 2020-10-13 — End: 2020-10-14
  Administered 2020-10-13 – 2020-10-14 (×3): 40 mg via INTRAVENOUS
  Filled 2020-10-13 (×3): qty 4

## 2020-10-13 NOTE — Plan of Care (Signed)
  Problem: Activity: Goal: Risk for activity intolerance will decrease Outcome: Progressing   Problem: Elimination: Goal: Will not experience complications related to bowel motility Outcome: Progressing   Problem: Safety: Goal: Ability to remain free from injury will improve Outcome: Progressing   

## 2020-10-13 NOTE — Progress Notes (Signed)
PROGRESS NOTE    Patient: Krista Strickland                            PCP: Fayrene Helper, MD                    DOB: 12-03-1950            DOA: 10/12/2020 LZJ:[id number]             DOS: 10/13/2020, 11:26 AM   LOS: 1 day   Date of Service: The patient was seen and examined on 10/13/2020  Subjective:   The patient was seen and examined this morning. Hypertensive otherwise hemodynamically stable this morning Overnight had an episode of chest pain, in hypertensive state, nonspecific EKG was noted with mildly elevated troponin... Was placed briefly nitroglycerin drip... Which was discontinued   Brief Narrative:   Krista Strickland is a 70 y.o. female with medical history significant for CKD stage IV, hypertension, type 2 diabetes mellitus, GERD, hyperlipidemia, HOCM who presents to the emergency department due to abdominal pain which started around 1 PM today.  Pain was sharp in nature, it was constant and was rated as 9/10 on pain scale.  Pain was located left lower quadrant.  She endorsed one loose bowel movement today and was not sure if this was related to eating a larger than usual breakfast around 11 AM this morning.  She recently had renal biopsy on 9/9.  She follows with Dr. Theador Hawthorne due to CKD and was last seen on 8/18.   ED: 171/89, mild leukocytosis, hyponatremia,, BUN to creatinine 93/4.48, hyperglycemia, albumin 3.3.  Influenza A, B, SARS coronavirus 2 was negative.   CT abdomen and pelvis without contrast showed acute 8 x 4 x 12 cm left renal subcapsular hematoma.  Interventional radiologist was consulted and recommended monitoring patient for 24 to 48 hours    Assessment & Plan:   Principal Problem:   Abdominal pain Active Problems:   Mixed hyperlipidemia   Obesity (BMI 30.0-34.9)   Essential hypertension   Anemia in chronic kidney disease   Renal hematoma, left, initial encounter   Hyponatremia   Hypoalbuminemia due to protein-calorie malnutrition (HCC)    Hyperglycemia due to diabetes mellitus (HCC)   Acute kidney injury superimposed on CKD (HCC)   Leukocytosis   Chest pain  -Brief episode of chest pain, and hypertensive state overnight -Likely ischemic demand in hypertensive state -Mildly elevated troponin, with nonspecific EKG changes -Troponin 100, 123, 122 -Patient is currently chest pain-free, continue as needed nitroglycerin, aspirin, -Cardiologist consulted for evaluation recommendations -Pending 2D echocardiogram -Continue to monitor closely  Abdominal pain -Likely related to left renal subscapular hematoma -Stating improved with pain medication but still having pain -CT abdomen and pelvis without contrast showed scattered colonic diverticulosis with no acute diverticulitis..  Also revealing left renal subscapular hematoma Continue IV NS at 75 mLs/Hr Continue IV Dilaudid 0.5 mg q.4h p.r.n. for moderate to severe pain Continue IV Zofran p.r.n. Procalcitonin will be down to rule out infectious process    leukocytosis possibly reactive vs infective WBC 15.4, >> 17.0  -Likely reactive, no source of infection -UA chest x-ray negative -  procalcitonin 0.34, afebrile, hypertensive - Withholding antibiotics at this time    Left renal subscapular hematoma -IR following, H&H remained stable, recommending no intervention at this time  CT abdomen and pelvis without contrast showed acute 8 x 4 x 12 cm left  renal subcapsular hematoma.  Discussed with nephrologist Dr. Othelia Pulling and discussed the case with interventional radiologist Dr. Hassell-recommended close monitoring, no need for transfer to [facility] for possible embolization at this time. -Monitoring H&H-stable -Continue to BladderScan, mild retention, negative for hematuria or obstruction or clot -We will monitor for urinary retention, hematuria   Acute kidney injury CKD stage V/dehydration BUN to creatinine 93/4.48 (baseline creatinine at 2.7 x 3.7) >> 94/4.32, Continue  IV hydration Renally adjust medications, avoid nephrotoxic agents/dehydration/hypotension -Avoiding nephrotoxins, appreciate nephrology input  Hyponatremia (chronic) This may be due to diuretic use..  Serum sodium 127, 126, 127  Continue gentle hydration Continue to monitor sodium with serial BMPs Urine and serum osmolality and urine sodium will be checked  Hyperglycemia secondary to T2DM Continue ISS and hypoglycemic protocol -Monitoring CBG closely  Anemia of chronic disease Hemoglobin at 11.8 (baseline at 12.2-13.4) >>10.1, 9.5 This is possibly secondary to patient's history of CKD Monitoring H&H every 8 hours  Essential hypertension (uncontrolled) -accelerated hypertension Continue Lopressor, diltiazem, Lasix, hydralazine, -Stable, as needed IV hydralazine  Hyperlipidemia Continue Zetia and Pravachol -Stable  Hypoalbuminemia secondary to mild protein calorie malnutrition Protein supplement will be provided  Obesity (BMI; 31.7) Patient counseled on diet lifestyle modification     Consultants:  IR/ Nephrologist    --------------------------------------------------------------------------------------------------------------------------------  DVT prophylaxis:  SCD Code Status:   Code Status: Full Code  Family Communication: Sister at bedside  The above findings and plan of care has been discussed with patient (and family)  in detail,  they expressed understanding and agreement of above. -Advance care planning has been discussed.   Admission status:   Status is: Observation  The patient remains OBS appropriate and will d/c before 2 midnights.  Dispo: The patient is from: Home              Anticipated d/c is to: Home              Patient currently is not medically stable to d/c.   Difficult to place patient No   Level of care: ICU   Procedures:   No admission procedures for hospital encounter.    Antimicrobials:  Anti-infectives (From admission,  onward)    None        Medication:   calcitRIOL  0.25 mcg Oral Daily   Chlorhexidine Gluconate Cloth  6 each Topical Daily   diltiazem  180 mg Oral Daily   ezetimibe  10 mg Oral Daily   febuxostat  40 mg Oral Daily   feeding supplement (GLUCERNA SHAKE)  237 mL Oral TID BM   furosemide  40 mg Intravenous Q12H   gabapentin  200 mg Oral BID   hydrALAZINE  50 mg Oral Q8H   influenza vaccine adjuvanted  0.5 mL Intramuscular Tomorrow-1000   insulin aspart  0-5 Units Subcutaneous QHS   insulin aspart  0-6 Units Subcutaneous TID WC   insulin aspart protamine- aspart  50 Units Subcutaneous Q breakfast   metoprolol tartrate  75 mg Oral BID   pravastatin  20 mg Oral QAC breakfast   sodium chloride  1 g Oral TID WC    hydrALAZINE, HYDROcodone-acetaminophen, HYDROmorphone (DILAUDID) injection, ondansetron (ZOFRAN) IV   Objective:   Vitals:   10/13/20 0659 10/13/20 0700 10/13/20 0800 10/13/20 1106  BP:  (!) 166/51 (!) 186/53   Pulse: 81 80 81 83  Resp: 18 (!) 21 19 19   Temp: 98.4 F (36.9 C)   98.8 F (37.1 C)  TempSrc: Oral  Oral  SpO2: 100% 100% 100% 100%  Weight:      Height:        Intake/Output Summary (Last 24 hours) at 10/13/2020 1126 Last data filed at 10/13/2020 0641 Gross per 24 hour  Intake 2133.27 ml  Output 3375 ml  Net -1241.73 ml   Filed Weights   10/12/20 1659  Weight: 80.7 kg     Examination:      Physical Exam:   General:  Alert, oriented, cooperative, no distress;   HEENT:  Normocephalic, PERRL, otherwise with in Normal limits   Neuro:  CNII-XII intact. , normal motor and sensation, reflexes intact   Lungs:   Clear to auscultation BL, Respirations unlabored, no wheezes / crackles  Cardio:    S1/S2, RRR, No murmure, No Rubs or Gallops   Abdomen:   Soft, non-tender, bowel sounds active all four quadrants,  no guarding or peritoneal signs.  Muscular skeletal:  Left flank pain, CVA tenderness, Limited exam - in bed, able to move all 4  extremities,  2+ pulses,  symmetric, No pitting edema  Skin:  Dry, warm to touch, negative for any Rashes,  Wounds: Please see nursing documentation          -----------------------------------------------------------------------------------------------------------------------------    LABs:  CBC Latest Ref Rng & Units 10/13/2020 10/13/2020 10/12/2020  WBC 4.0 - 10.5 K/uL 17.6(H) 17.0(H) 15.4(H)  Hemoglobin 12.0 - 15.0 g/dL 9.5(L) 9.5(L) 10.1(L)  Hematocrit 36.0 - 46.0 % 28.9(L) 28.6(L) 30.6(L)  Platelets 150 - 400 K/uL 220 214 206   CMP Latest Ref Rng & Units 10/13/2020 10/12/2020 10/22/2020  Glucose 70 - 99 mg/dL 190(H) 54(L) 139(H)  BUN 8 - 23 mg/dL 94(H) 94(H) 93(H)  Creatinine 0.44 - 1.00 mg/dL 4.32(H) 4.46(H) 4.48(H)  Sodium 135 - 145 mmol/L 123(L) 126(L) 127(L)  Potassium 3.5 - 5.1 mmol/L 4.9 4.5 4.8  Chloride 98 - 111 mmol/L 95(L) 91(L) 94(L)  CO2 22 - 32 mmol/L 20(L) 23 21(L)  Calcium 8.9 - 10.3 mg/dL 8.3(L) 9.1 9.2  Total Protein 6.5 - 8.1 g/dL - 6.4(L) 7.0  Total Bilirubin 0.3 - 1.2 mg/dL - 0.5 0.4  Alkaline Phos 38 - 126 U/L - 76 82  AST 15 - 41 U/L - 24 39  ALT 0 - 44 U/L - 34 43       Micro Results Recent Results (from the past 240 hour(s))  Resp Panel by RT-PCR (Flu A&B, Covid) Nasopharyngeal Swab     Status: None   Collection Time: 10/04/2020  9:07 PM   Specimen: Nasopharyngeal Swab; Nasopharyngeal(NP) swabs in vial transport medium  Result Value Ref Range Status   SARS Coronavirus 2 by RT PCR NEGATIVE NEGATIVE Final    Comment: (NOTE) SARS-CoV-2 target nucleic acids are NOT DETECTED.  The SARS-CoV-2 RNA is generally detectable in upper respiratory specimens during the acute phase of infection. The lowest concentration of SARS-CoV-2 viral copies this assay can detect is 138 copies/mL. A negative result does not preclude SARS-Cov-2 infection and should not be used as the sole basis for treatment or other patient management decisions. A negative result may  occur with  improper specimen collection/handling, submission of specimen other than nasopharyngeal swab, presence of viral mutation(s) within the areas targeted by this assay, and inadequate number of viral copies(<138 copies/mL). A negative result must be combined with clinical observations, patient history, and epidemiological information. The expected result is Negative.  Fact Sheet for Patients:  EntrepreneurPulse.com.au  Fact Sheet for Healthcare Providers:  IncredibleEmployment.be  This test  is no t yet approved or cleared by the [place] and  has been authorized for detection and/or diagnosis of SARS-CoV-2 by FDA under an Emergency Use Authorization (EUA). This EUA will remain  in effect (meaning this test can be used) for the duration of the COVID-19 declaration under Section 564(b)(1) of the Act, 21 U.S.C.section 360bbb-3(b)(1), unless the authorization is terminated  or revoked sooner.       Influenza A by PCR NEGATIVE NEGATIVE Final   Influenza B by PCR NEGATIVE NEGATIVE Final    Comment: (NOTE) The Xpert Xpress SARS-CoV-2/FLU/RSV plus assay is intended as an aid in the diagnosis of influenza from Nasopharyngeal swab specimens and should not be used as a sole basis for treatment. Nasal washings and aspirates are unacceptable for Xpert Xpress SARS-CoV-2/FLU/RSV testing.  Fact Sheet for Patients: EntrepreneurPulse.com.au  Fact Sheet for Healthcare Providers: IncredibleEmployment.be  This test is not yet approved or cleared by the [place] FDA and has been authorized for detection and/or diagnosis of SARS-CoV-2 by FDA under an Emergency Use Authorization (EUA). This EUA will remain in effect (meaning this test can be used) for the duration of the COVID-19 declaration under Section 564(b)(1) of the Act, 21 U.S.C. section 360bbb-3(b)(1), unless the authorization is terminated  or revoked.  Performed at [facility], [address], [place], [place] [address]   MRSA Next Gen by PCR, Nasal     Status: None   Collection Time: 10/12/20  5:00 PM   Specimen: Nasal Mucosa; Nasal Swab  Result Value Ref Range Status   MRSA by PCR Next Gen NOT DETECTED NOT DETECTED Final    Comment: (NOTE) The GeneXpert MRSA Assay (FDA approved for NASAL specimens only), is one component of a comprehensive MRSA colonization surveillance program. It is not intended to diagnose MRSA infection nor to guide or monitor treatment for MRSA infections. Test performance is not FDA approved in patients less than 69 years old. Performed at [facility], [address], [place], [place] [address]     Radiology Reports CT Abdomen Pelvis Wo Contrast  Result Date: 10/18/2020 CLINICAL DATA:  abdominal pain that began today Pt had renal biopsy 3 days ago EXAM: CT ABDOMEN AND PELVIS WITHOUT CONTRAST TECHNIQUE: Multidetector CT imaging of the abdomen and pelvis was performed following the standard protocol without IV contrast. COMPARISON:  CT lumbar spine 09/23/2019, CT abdomen pelvis 07/22/2019 FINDINGS: Lower chest: Redemonstration of calcified right lower lobe pulmonary nodules (4:14, 8). Similar finding on the left in the lower lobe (4:4). Hepatobiliary: No focal liver abnormality. No gallstones, gallbladder wall thickening, or pericholecystic fluid. No biliary dilatation. Pancreas: No focal lesion. Normal pancreatic contour. No surrounding inflammatory changes. No main pancreatic ductal dilatation. Spleen: Normal in size without focal abnormality. Adrenals/Urinary Tract: No adrenal nodule bilaterally. Left perinephric fat stranding. There is a hyperdense 8 x 4 x 12cm subcapsular hematoma along the left kidney leading to mass effect on the renal parenchyma. No nephrolithiasis and no hydronephrosis. No definite contour-deforming renal mass. No ureterolithiasis or hydroureter. The urinary bladder is  unremarkable. Stomach/Bowel: PO contrast reaches the transverse colon. Stomach is within normal limits. No evidence of bowel wall thickening or dilatation. Diffuse sigmoid diverticulosis. Appendix appears normal. Vascular/Lymphatic: No abdominal aorta or iliac aneurysm. Severe atherosclerotic plaque of the aorta and its branches. No abdominal, pelvic, or inguinal lymphadenopathy. Reproductive: There is a 2.8 cm right adnexal cystic lesion. The uterus is not well visualized and likely surgically removed. The left adnexal region is unremarkable. Other:  Hyperdense free fluid noted coursing into the pelvis no intraperitoneal free fluid. No intraperitoneal free gas. No organized fluid collection. Musculoskeletal: Small fat containing umbilical hernia. No suspicious lytic or blastic osseous lesions. No acute displaced fracture. Multilevel degenerative changes of the spine. Grade 1 anterolisthesis of L4 on L5. IMPRESSION: 1. Acute 8 x 4 x12 cm left renal subcapsular hematoma. 2. Scattered colonic diverticulosis with no acute diverticulitis. 3. Stable bilateral lower lobe calcified pulmonary nodules. 4. A 2.8 cm right adnexal cystic lesion. No follow-up imaging recommended. Note: This recommendation does not apply to premenarchal patients and to those with increased risk (genetic, family history, elevated tumor markers or other high-risk factors) of ovarian cancer. Reference: JACR 2020 Feb; 17(2):248-254. 5.  Aortic Atherosclerosis (ICD10-I70.0). These results were called by telephone at the time of interpretation on 10/22/2020 at 8:42 pm to provider [facility] , who verbally acknowledged these results. Electronically Signed   By: Iven Finn M.D.   On: 10/27/2020 20:53   US RENAL  Result Date: 10/12/2020 CLINICAL DATA:  AK I on CKD, history of left kidney biopsy 10/07/2020 EXAM: RENAL / URINARY TRACT ULTRASOUND COMPLETE COMPARISON:  CT abdomen/pelvis 1 day prior FINDINGS: Right Kidney: Renal measurements: 10.4 cm  x 5.4 cm x 5.3 cm = volume: 155 mL. Echogenicity within normal limits. No mass or hydronephrosis visualized. Left Kidney: Renal measurements: 12.8 cm x 6.2 cm x 6.0 cm = volume: 249 mL. There is a subcapsular hematoma along the left kidney measuring approximately 10.6 cm x 2.8 cm x 6.5 cm, as seen on the CT obtained 1 day prior. Bladder: Appears normal for degree of bladder distention. Other: None. IMPRESSION: Subcapsular hematoma along the left kidney, grossly similar in size allowing for difference in modality. Electronically Signed   By: Valetta Mole M.D.   On: 10/12/2020 11:15   DG CHEST PORT 1 VIEW  Result Date: 10/12/2020 CLINICAL DATA:  Chest pain. EXAM: PORTABLE CHEST 1 VIEW COMPARISON:  Chest x-ray 01/06/2004.  Chest x-ray 09/23/2019. FINDINGS: The heart is enlarged, unchanged. There is central pulmonary vascular congestion. There is a new small left pleural effusion with patchy opacities in the left lung base. There is no evidence for pneumothorax. No acute fractures are identified. IMPRESSION: 1.  Cardiomegaly with central pulmonary vascular congestion. 2. New small left pleural effusion with left basilar atelectasis/airspace disease. Electronically Signed   By: Ronney Asters M.D.   On: 10/12/2020 23:56   US BIOPSY (KIDNEY)  Result Date: 10/07/2020 INDICATION: 70 year old female with a history of proteinuria referred for medical renal biopsy EXAM: IMAGE GUIDED MEDICAL RENAL BIOPSY MEDICATIONS: None. ANESTHESIA/SEDATION: Moderate (conscious) sedation was employed during this procedure. A total of Versed 1.0 mg and Fentanyl 50 mcg was administered intravenously. Moderate Sedation Time: 10 minutes. The patient's level of consciousness and vital signs were monitored continuously by radiology nursing throughout the procedure under my direct supervision. FLUOROSCOPY TIME:  Ultrasound COMPLICATIONS: None PROCEDURE: Informed written consent was obtained from the patient after a thorough discussion of the  procedural risks, benefits and alternatives. All questions were addressed. Maximal Sterile Barrier Technique was utilized including caps, mask, sterile gowns, sterile gloves, sterile drape, hand hygiene and skin antiseptic. A timeout was performed prior to the initiation of the procedure. Patient was positioned prone position on the gantry table. Images were stored sent to PACs. Once the patient is prepped and draped in the usual sterile fashion, the skin and subcutaneous tissues overlying the left kidney were generously infiltrated 1% lidocaine for local  anesthesia. Using ultrasound guidance, a 15 gauge guide needle was advanced into the lower cortex of the left kidney. Once we confirmed location of the needle tip, 2 separate 16 gauge core biopsy were achieved. Two Gel-Foam pledgets were infused with a small amount of saline. The needle was removed. Final images were stored. The patient tolerated the procedure well and remained hemodynamically stable throughout. No complications were encountered and no significant blood loss encountered. IMPRESSION: Status post ultrasound-guided medical renal biopsy. Signed, Dulcy Fanny. Dellia Nims, RPVI [facility] Electronically Signed   By: Corrie Mckusick D.O.   On: 10/07/2020 10:11    SIGNED: Deatra James, MD, FHM. [facility],  Pager (please use [email or web address] to page/text) Please use Epic Secure Chat for non-urgent communication (7AM-7PM)  If 7PM-7AM, please contact night-coverage [email or web address], 10/13/2020, 11:26 AM

## 2020-10-13 NOTE — TOC Progression Note (Signed)
Transition of Care [facility]) - Progression Note    Patient Details  Name: Krista Strickland MRN: [id number] Date of Birth: 1950/11/09  Transition [facility]) CM/SW [place], LCSW Phone Number: 10/13/2020, 3:57 PM  Clinical Narrative:    CSW discussed SNF or [place] with patient and husband. Patient's husband reported that he would be able to accomodates patient in the home with [facility]. Patient reported that she still would like to go home with [facility]. TOC to follow.   Expected Discharge Plan: [place] Barriers to Discharge: Continued Medical Work up  Expected Discharge Plan and Services Expected Discharge Plan: [place] Choice: Home Health, Durable Medical Equipment Living arrangements for the past 2 months: Single Family Home                                       Social Determinants of Health (SDOH) Interventions    Readmission Risk Interventions Readmission Risk Prevention Plan 10/12/2020  Transportation Screening Complete  Home Care Screening Complete  Medication Review (RN CM) Complete  Some recent data might be hidden

## 2020-10-13 NOTE — Progress Notes (Addendum)
[facility] NEPHROLOGY PROGRESS NOTE  Assessment/ Plan: Pt is a 70 y.o. yo female  with history of hypertension, diabetes, anemia, secondary hyperparathyroidism, stroke, hypertrophic obstructive cardiomyopathy, HLD, obesity, uterine cancer, CKD stage IV, seen as a consultation for the evaluation of AKI on CKD 4.   #Acute kidney injury on CKD stage IV: Patient has longstanding CKD with baseline creatinine level seems to be around 2.6-2.9 however recently the GFR declined and creatinine level around 3.6 as outpatient.  The CKD with subnephrotic proteinuria was thought to be due to diabetes and HTN followed by Dr. Theador Hawthorne.  She underwent IR guided kidney biopsy on 9/9 at [facility] and now admitted with subcapsular hematoma and anemia.  The acute worsening in creatinine level probably hemodynamically mediated with some intravascular volume depletion concomitant with decreased nephron mass due to mass-effect of hematoma and acute urinary retention.  She received some fluid in ER and later noticed a dose of Lasix.  Urine output is increased with creatinine level trending down.  She looks volume up on exam therefore I will start Lasix IV twice a day.  Continue with strict ins and out, daily lab. Follow-up the kidney biopsy result with her nephrology. No need for dialysis at this time.   #Post biopsy left kidney hematoma: 8 x 4 x 12 cm subcapsular hematoma along the left kidney leading to mass-effect on the renal parenchyma.  IR was consulted, no plan for embolization.  Continue to monitor CBC and imaging studies.   #Hyponatremia, hypervolemic: Seems to be chronic.  Asymptomatic.  On torsemide and metolazone as outpatient.  Sodium level worsened today and has some edema.  Starting loop diuretic/Lasix IV as discussed above.  Salt tablet for increased solute.  Monitor lab.    #Acute urinary retention: Bladder scan with more than 999 cc of urine.  Bladder scan and intraurethral catheter if needed.    #Hypertension: BP elevated.  Currently on diltiazem.  Hydralazine was increased yesterday.  Starting IV Lasix which did help to manage volume/BP.   #Anemia of CKD and blood loss postbiopsy: Recommend to check CBC every 6-8 hours.  Discussed with the primary team.  Subjective: Seen and examined at bedside.  No nausea vomiting chest pain shortness of breath.  Urine output noted 3.3 L.  No new event. Objective Vital signs in last 24 hours: Vitals:   10/13/20 0631 10/13/20 0659 10/13/20 0700 10/13/20 0800  BP: (!) 175/62  (!) 166/51 (!) 186/53  Pulse: 77 81 80 81  Resp: 17 18 (!) 21 19  Temp:  98.4 F (36.9 C)    TempSrc:  Oral    SpO2: 99% 100% 100% 100%  Weight:      Height:       Weight change:   Intake/Output Summary (Last 24 hours) at 10/13/2020 0926 Last data filed at 10/13/2020 0641 Gross per 24 hour  Intake 2895.75 ml  Output 3375 ml  Net -479.25 ml       Labs: Basic Metabolic Panel: Recent Labs  Lab 10/17/2020 1738 10/12/20 0422 10/13/20 0205  NA 127* 126* 123*  K 4.8 4.5 4.9  CL 94* 91* 95*  CO2 21* 23 20*  GLUCOSE 139* 54* 190*  BUN 93* 94* 94*  CREATININE 4.48* 4.46* 4.32*  CALCIUM 9.2 9.1 8.3*  PHOS  --  6.2*  --    Liver Function Tests: Recent Labs  Lab 09/30/2020 1738 10/12/20 0422  AST 39 24  ALT 43 34  ALKPHOS 82 76  BILITOT  0.4 0.5  PROT 7.0 6.4*  ALBUMIN 3.3* 3.1*   Recent Labs  Lab 10/25/2020 1738  LIPASE 37   No results for input(s): AMMONIA in the last 168 hours. CBC: Recent Labs  Lab 10/04/2020 1738 10/12/20 0422 10/12/20 1122 10/12/20 1913 10/13/20 0205 10/13/20 0814  WBC 15.4* 14.7* 13.3* 15.4* 17.0* 17.6*  NEUTROABS 12.7*  --   --   --   --   --   HGB 11.8* 10.8* 10.1* 10.1* 9.5* 9.5*  HCT 35.7* 32.6* 31.3* 30.6* 28.6* 28.9*  MCV 88.1 87.2 88.2 88.4 88.0 88.1  PLT 270 244 216 206 214 220   Cardiac Enzymes: No results for input(s): CKTOTAL, CKMB, CKMBINDEX, TROPONINI in the last 168 hours. CBG: Recent Labs  Lab  10/12/20 0930 10/12/20 1147 10/12/20 1703 10/12/20 2143 10/13/20 0701  GLUCAP 144* 265* 216* 183* 147*    Iron Studies: No results for input(s): IRON, TIBC, TRANSFERRIN, FERRITIN in the last 72 hours. Studies/Results: CT Abdomen Pelvis Wo Contrast  Result Date: 10/21/2020 CLINICAL DATA:  abdominal pain that began today Pt had renal biopsy 3 days ago EXAM: CT ABDOMEN AND PELVIS WITHOUT CONTRAST TECHNIQUE: Multidetector CT imaging of the abdomen and pelvis was performed following the standard protocol without IV contrast. COMPARISON:  CT lumbar spine 09/23/2019, CT abdomen pelvis 07/22/2019 FINDINGS: Lower chest: Redemonstration of calcified right lower lobe pulmonary nodules (4:14, 8). Similar finding on the left in the lower lobe (4:4). Hepatobiliary: No focal liver abnormality. No gallstones, gallbladder wall thickening, or pericholecystic fluid. No biliary dilatation. Pancreas: No focal lesion. Normal pancreatic contour. No surrounding inflammatory changes. No main pancreatic ductal dilatation. Spleen: Normal in size without focal abnormality. Adrenals/Urinary Tract: No adrenal nodule bilaterally. Left perinephric fat stranding. There is a hyperdense 8 x 4 x 12cm subcapsular hematoma along the left kidney leading to mass effect on the renal parenchyma. No nephrolithiasis and no hydronephrosis. No definite contour-deforming renal mass. No ureterolithiasis or hydroureter. The urinary bladder is unremarkable. Stomach/Bowel: PO contrast reaches the transverse colon. Stomach is within normal limits. No evidence of bowel wall thickening or dilatation. Diffuse sigmoid diverticulosis. Appendix appears normal. Vascular/Lymphatic: No abdominal aorta or iliac aneurysm. Severe atherosclerotic plaque of the aorta and its branches. No abdominal, pelvic, or inguinal lymphadenopathy. Reproductive: There is a 2.8 cm right adnexal cystic lesion. The uterus is not well visualized and likely surgically removed. The left  adnexal region is unremarkable. Other: Hyperdense free fluid noted coursing into the pelvis no intraperitoneal free fluid. No intraperitoneal free gas. No organized fluid collection. Musculoskeletal: Small fat containing umbilical hernia. No suspicious lytic or blastic osseous lesions. No acute displaced fracture. Multilevel degenerative changes of the spine. Grade 1 anterolisthesis of L4 on L5. IMPRESSION: 1. Acute 8 x 4 x12 cm left renal subcapsular hematoma. 2. Scattered colonic diverticulosis with no acute diverticulitis. 3. Stable bilateral lower lobe calcified pulmonary nodules. 4. A 2.8 cm right adnexal cystic lesion. No follow-up imaging recommended. Note: This recommendation does not apply to premenarchal patients and to those with increased risk (genetic, family history, elevated tumor markers or other high-risk factors) of ovarian cancer. Reference: JACR 2020 Feb; 17(2):248-254. 5.  Aortic Atherosclerosis (ICD10-I70.0). These results were called by telephone at the time of interpretation on 10/07/2020 at 8:42 pm to provider [facility] , who verbally acknowledged these results. Electronically Signed   By: Iven Finn M.D.   On: 10/28/2020 20:53   US RENAL  Result Date: 10/12/2020 CLINICAL DATA:  AK I on CKD, history  of left kidney biopsy 10/07/2020 EXAM: RENAL / URINARY TRACT ULTRASOUND COMPLETE COMPARISON:  CT abdomen/pelvis 1 day prior FINDINGS: Right Kidney: Renal measurements: 10.4 cm x 5.4 cm x 5.3 cm = volume: 155 mL. Echogenicity within normal limits. No mass or hydronephrosis visualized. Left Kidney: Renal measurements: 12.8 cm x 6.2 cm x 6.0 cm = volume: 249 mL. There is a subcapsular hematoma along the left kidney measuring approximately 10.6 cm x 2.8 cm x 6.5 cm, as seen on the CT obtained 1 day prior. Bladder: Appears normal for degree of bladder distention. Other: None. IMPRESSION: Subcapsular hematoma along the left kidney, grossly similar in size allowing for difference in  modality. Electronically Signed   By: Valetta Mole M.D.   On: 10/12/2020 11:15   DG CHEST PORT 1 VIEW  Result Date: 10/12/2020 CLINICAL DATA:  Chest pain. EXAM: PORTABLE CHEST 1 VIEW COMPARISON:  Chest x-ray 01/06/2004.  Chest x-ray 09/23/2019. FINDINGS: The heart is enlarged, unchanged. There is central pulmonary vascular congestion. There is a new small left pleural effusion with patchy opacities in the left lung base. There is no evidence for pneumothorax. No acute fractures are identified. IMPRESSION: 1.  Cardiomegaly with central pulmonary vascular congestion. 2. New small left pleural effusion with left basilar atelectasis/airspace disease. Electronically Signed   By: Ronney Asters M.D.   On: 10/12/2020 23:56    Medications: Infusions:  niCARDipine Stopped (10/12/20 2347)    Scheduled Medications:  calcitRIOL  0.25 mcg Oral Daily   Chlorhexidine Gluconate Cloth  6 each Topical Daily   diltiazem  180 mg Oral Daily   ezetimibe  10 mg Oral Daily   febuxostat  40 mg Oral Daily   feeding supplement (GLUCERNA SHAKE)  237 mL Oral TID BM   gabapentin  200 mg Oral BID   hydrALAZINE  50 mg Oral Q8H   influenza vaccine adjuvanted  0.5 mL Intramuscular Tomorrow-1000   insulin aspart  0-5 Units Subcutaneous QHS   insulin aspart  0-6 Units Subcutaneous TID WC   insulin aspart protamine- aspart  50 Units Subcutaneous Q breakfast   metoprolol tartrate  50 mg Oral BID   pravastatin  20 mg Oral QAC breakfast   sodium chloride  1 g Oral BID WC    have reviewed scheduled and prn medications.  Physical Exam: General:NAD, comfortable Heart:RRR, s1s2 nl Lungs:clear b/l, no crackle Abdomen:soft, Non-tender, non-distended Extremities: Bilateral ankle edema present+ Neurology: Alert, awake, nonfocal  Krista Strickland 10/13/2020,9:26 AM  LOS: 1 day

## 2020-10-13 NOTE — Progress Notes (Signed)
Referring Physician(s): Dr Cherlyn Labella  Supervising Physician: Arne Cleveland  Patient Status:  [facility] IP  Chief Complaint:  Renal bx 10/07/20 in IR Renal hematoma on CT 10/26/2020  Subjective:  Admitted into ICU at [facility] Pt is restful and comfortable I can elicit pain in Left flank with palpation Flank pain only with palpation-- comfortable otherwise Hg to 9.5 this am (yesterday: 10.8---10.1---10.1---today: 9.5 2 am---9.5 815 am) Denies hematuria  Allergies: Benazepril, Metronidazole, Mobic [meloxicam], Penicillins, and Sulfonamide derivatives  Medications: Prior to Admission medications   Medication Sig Start Date End Date Taking? Authorizing Provider  aspirin EC 81 MG tablet Take 1 tablet (81 mg total) by mouth daily. Swallow whole. Patient taking differently: Take 325 mg by mouth daily. Swallow whole. 09/13/20  Yes Satira Sark, MD  calcitRIOL (ROCALTROL) 0.25 MCG capsule Take 0.25 mcg by mouth 3 (three) times a week.   Yes [provider]  cyclobenzaprine (FLEXERIL) 10 MG tablet TAKE 1 TABLET BY MOUTH  EVERY NIGHT AT  BEDTIME AS NEEDED FOR SPASM Patient taking differently: Take 10 mg by mouth at bedtime as needed for muscle spasms. 08/15/20  Yes Sanjuana Kava, MD  DILT-XR 180 MG 24 hr capsule Take 1 capsule by mouth once daily Patient taking differently: Take 180 mg by mouth daily. 09/05/20  Yes Fayrene Helper, MD  epoetin alfa (EPOGEN) 3000 UNIT/ML injection Inject 3,000 Units into the vein every 14 (fourteen) days.    Yes Bhutani, Manpreet S, MD  ezetimibe (ZETIA) 10 MG tablet Take 1 tablet (10 mg total) by mouth daily. 11/12/19  Yes Fayrene Helper, MD  febuxostat (ULORIC) 40 MG tablet One daily for gout Patient taking differently: Take 40 mg by mouth daily. One daily for gout 06/14/20  Yes Sanjuana Kava, MD  gabapentin (NEURONTIN) 600 MG tablet Take 300 mg by mouth 2 (two) times daily. 03/21/20  Yes [provider]  hydrALAZINE (APRESOLINE) 25  MG tablet TAKE 1 TABLET BY MOUTH TWICE DAILY (AT  9:00  AM  AND  9:00  PM) Patient taking differently: Take 25 mg by mouth in the morning and at bedtime. 08/15/20  Yes Fayrene Helper, MD  HYDROcodone-acetaminophen (NORCO/VICODIN) 5-325 MG tablet One tablet every six hours for pain.  Limit 7 days. Patient taking differently: Take 1 tablet by mouth every 6 (six) hours as needed for severe pain. Limit 7 days. 09/13/20  Yes Sanjuana Kava, MD  insulin NPH-regular Human (NOVOLIN 70/30 RELION) (70-30) 100 UNIT/ML injection Inject 105 Units into the skin daily with breakfast. 06/29/20  Yes Renato Shin, MD  metoprolol tartrate (LOPRESSOR) 100 MG tablet Take 1 tablet by mouth twice daily Patient taking differently: Take 50 mg by mouth 2 (two) times daily. 05/10/20  Yes Fayrene Helper, MD  potassium chloride (KLOR-CON) 10 MEQ tablet Take 30 mEq by mouth 2 (two) times daily.   Yes [provider]  pravastatin (PRAVACHOL) 20 MG tablet TAKE 1 TABLET BY MOUTH ONCE DAILY BEFORE BREAKFAST Patient taking differently: Take 20 mg by mouth daily. 08/08/20  Yes Fayrene Helper, MD  prednisoLONE acetate (PRED FORTE) 1 % ophthalmic suspension Place 1 drop into the left eye in the morning and at bedtime. 05/20/20  Yes [provider]  torsemide (DEMADEX) 20 MG tablet Take 2.5 tablets (50 mg total) by mouth 2 (two) times daily. 09/13/20  Yes Satira Sark, MD  metolazone (ZAROXOLYN) 5 MG tablet Take 1 tablet by mouth once daily Patient taking differently: Take 5  mg by mouth daily. 08/15/20   Fayrene Helper, MD  [facility] ULTRA test strip USE 1 STRIP TO CHECK GLUCOSE TWICE DAILY 06/20/20   Renato Shin, MD  spironolactone (ALDACTONE) 25 MG tablet Take 75 mg by mouth daily. 06/23/20 06/23/21  [provider]     Vital Signs: BP (!) 186/53   Pulse 81   Temp 98.4 F (36.9 C) (Oral)   Resp 19   Ht 5\' 2"  (1.575 m)   Wt 177 lb 14.6 oz (80.7 kg)   SpO2 100%   BMI 32.54 kg/m    Physical Exam Abdominal:     Palpations: Abdomen is soft.     Tenderness: There is no abdominal tenderness.  Skin:    General: Skin is warm.     Comments: Site of Left renal biopsy is clean and dry' some tenderness elicited to palpate area    Imaging: CT Abdomen Pelvis Wo Contrast  Result Date: 10/12/2020 CLINICAL DATA:  abdominal pain that began today Pt had renal biopsy 3 days ago EXAM: CT ABDOMEN AND PELVIS WITHOUT CONTRAST TECHNIQUE: Multidetector CT imaging of the abdomen and pelvis was performed following the standard protocol without IV contrast. COMPARISON:  CT lumbar spine 09/23/2019, CT abdomen pelvis 07/22/2019 FINDINGS: Lower chest: Redemonstration of calcified right lower lobe pulmonary nodules (4:14, 8). Similar finding on the left in the lower lobe (4:4). Hepatobiliary: No focal liver abnormality. No gallstones, gallbladder wall thickening, or pericholecystic fluid. No biliary dilatation. Pancreas: No focal lesion. Normal pancreatic contour. No surrounding inflammatory changes. No main pancreatic ductal dilatation. Spleen: Normal in size without focal abnormality. Adrenals/Urinary Tract: No adrenal nodule bilaterally. Left perinephric fat stranding. There is a hyperdense 8 x 4 x 12cm subcapsular hematoma along the left kidney leading to mass effect on the renal parenchyma. No nephrolithiasis and no hydronephrosis. No definite contour-deforming renal mass. No ureterolithiasis or hydroureter. The urinary bladder is unremarkable. Stomach/Bowel: PO contrast reaches the transverse colon. Stomach is within normal limits. No evidence of bowel wall thickening or dilatation. Diffuse sigmoid diverticulosis. Appendix appears normal. Vascular/Lymphatic: No abdominal aorta or iliac aneurysm. Severe atherosclerotic plaque of the aorta and its branches. No abdominal, pelvic, or inguinal lymphadenopathy. Reproductive: There is a 2.8 cm right adnexal cystic lesion. The uterus is not well visualized  and likely surgically removed. The left adnexal region is unremarkable. Other: Hyperdense free fluid noted coursing into the pelvis no intraperitoneal free fluid. No intraperitoneal free gas. No organized fluid collection. Musculoskeletal: Small fat containing umbilical hernia. No suspicious lytic or blastic osseous lesions. No acute displaced fracture. Multilevel degenerative changes of the spine. Grade 1 anterolisthesis of L4 on L5. IMPRESSION: 1. Acute 8 x 4 x12 cm left renal subcapsular hematoma. 2. Scattered colonic diverticulosis with no acute diverticulitis. 3. Stable bilateral lower lobe calcified pulmonary nodules. 4. A 2.8 cm right adnexal cystic lesion. No follow-up imaging recommended. Note: This recommendation does not apply to premenarchal patients and to those with increased risk (genetic, family history, elevated tumor markers or other high-risk factors) of ovarian cancer. Reference: JACR 2020 Feb; 17(2):248-254. 5.  Aortic Atherosclerosis (ICD10-I70.0). These results were called by telephone at the time of interpretation on 10/26/2020 at 8:42 pm to provider [facility] , who verbally acknowledged these results. Electronically Signed   By: Iven Finn M.D.   On: 09/29/2020 20:53   US RENAL  Result Date: 10/12/2020 CLINICAL DATA:  AK I on CKD, history of left kidney biopsy 10/07/2020 EXAM: RENAL / URINARY TRACT ULTRASOUND COMPLETE  COMPARISON:  CT abdomen/pelvis 1 day prior FINDINGS: Right Kidney: Renal measurements: 10.4 cm x 5.4 cm x 5.3 cm = volume: 155 mL. Echogenicity within normal limits. No mass or hydronephrosis visualized. Left Kidney: Renal measurements: 12.8 cm x 6.2 cm x 6.0 cm = volume: 249 mL. There is a subcapsular hematoma along the left kidney measuring approximately 10.6 cm x 2.8 cm x 6.5 cm, as seen on the CT obtained 1 day prior. Bladder: Appears normal for degree of bladder distention. Other: None. IMPRESSION: Subcapsular hematoma along the left kidney, grossly similar in  size allowing for difference in modality. Electronically Signed   By: Valetta Mole M.D.   On: 10/12/2020 11:15   DG CHEST PORT 1 VIEW  Result Date: 10/12/2020 CLINICAL DATA:  Chest pain. EXAM: PORTABLE CHEST 1 VIEW COMPARISON:  Chest x-ray 01/06/2004.  Chest x-ray 09/23/2019. FINDINGS: The heart is enlarged, unchanged. There is central pulmonary vascular congestion. There is a new small left pleural effusion with patchy opacities in the left lung base. There is no evidence for pneumothorax. No acute fractures are identified. IMPRESSION: 1.  Cardiomegaly with central pulmonary vascular congestion. 2. New small left pleural effusion with left basilar atelectasis/airspace disease. Electronically Signed   By: Ronney Asters M.D.   On: 10/12/2020 23:56    Labs:  CBC: Recent Labs    10/12/20 1122 10/12/20 1913 10/13/20 0205 10/13/20 0814  WBC 13.3* 15.4* 17.0* 17.6*  HGB 10.1* 10.1* 9.5* 9.5*  HCT 31.3* 30.6* 28.6* 28.9*  PLT 216 206 214 220    COAGS: Recent Labs    10/07/20 0615 10/12/20 0422  INR 1.0 1.1  APTT  --  27    BMP: Recent Labs    10/29/19 1417 11/12/19 1424 03/17/20 1624 04/21/20 1537 09/22/20 1313 10/24/2020 1738 10/12/20 0422 10/13/20 0205  NA 133*   < > 138   < > 126* 127* 126* 123*  K 3.1*   < > 3.7   < > 4.6 4.8 4.5 4.9  CL 94*   < > 93*   < > 92* 94* 91* 95*  CO2 25   < > 25   < > 23 21* 23 20*  GLUCOSE 154*   < > 132*   < > 176* 139* 54* 190*  BUN 62*   < > 89*   < > 90* 93* 94* 94*  CALCIUM 9.8   < > 10.1   < > 9.0 9.2 9.1 8.3*  CREATININE 1.71*   < > 2.25*   < > 3.68* 4.48* 4.46* 4.32*  GFRNONAA 30*   < > 22*   < > 13* 10* 10* 10*  GFRAA 35*  --  25*  --   --   --   --   --    < > = values in this interval not displayed.    LIVER FUNCTION TESTS: Recent Labs    03/17/20 1624 04/21/20 1537 09/08/20 1130 09/22/20 1313 10/25/2020 1738 10/12/20 0422  BILITOT <0.2  --   --   --  0.4 0.5  AST 19  --   --   --  39 24  ALT 27  --   --   --  43 34   ALKPHOS 79  --   --   --  82 76  PROT 7.1  --   --   --  7.0 6.4*  ALBUMIN 4.1   < > 3.6 3.4* 3.3* 3.1*   < > = values  in this interval not displayed.    Assessment and Plan:  Renal bx in IR 9/9 Post bleed with renal subcapsular hematoma Hg 10.1 yesterday-- 9.5 x 2 this am VSS Will follow If need IR Rad-- call [phone] or [phone]  Electronically Signed: Lavonia Drafts, PA-C 10/13/2020, 10:37 AM   I spent a total of 15 Minutes at the the patient's bedside AND on the patient's hospital floor or unit, greater than 50% of which was counseling/coordinating care for post renal bx bleed

## 2020-10-13 NOTE — Consult Note (Addendum)
Cardiology Consultation:   Patient ID: GALEN MALKOWSKI MRN: [id number]; DOB: [birth year]  Admit date: 09/29/2020 Date of Consult: 10/13/2020  PCP:  Fayrene Helper, MD   [facility] Providers Cardiologist:  Rozann Lesches, MD        Patient Profile:   DAIANA VITIELLO is a 70 y.o. female with a past medical history of hypertrophic obstructive cardiomyopathy, HTN, HLD, Type 2 DM and Stage 4 CKD who is being seen 10/13/2020 for the evaluation of elevated troponin values at the request of Dr. Roger Shelter.  History of Present Illness:   Ms. Cullom was recently examined by Dr. Domenic Polite last month and reported having chronic lower extremity edema and NYHA class II dyspnea but denied any recent chest pain. Diuretic therapy was deferred to Nephrology and she was continued on Torsemide 50 mg twice daily along with Metolazone 5 mg daily. Was also continued on Cardizem CD 180 mg daily and Lopressor 50 mg twice daily for her cardiomyopathy.  She presented to [facility] ED on 10/03/2020 for evaluation of abdominal pain which had started earlier in the day and was located along her left lower quadrant. Had recently undergone renal biopsy on 10/07/2020 at [facility]. CT imaging was obtained and showed an acute 8 x 4 x 12 cm left renal subscapular hematoma along with scattered chronic diverticulosis and pulmonary nodules. IR was consulted and recommended close monitoring for 24 to 48 hours.  Initial labs on admission showed WBC 15.4, Hgb 11.8, platelets 270, Na+ 127, K+ 4.8 and creatinine 4.48. Covid negative. Was started on IV fluids and IV pain medication for her hematoma.  On 10/12/2020, she developed chest pain and troponin values were obtained and have been flat at 100, 123 and 122.  Repeat CBC shows her hemoglobin has drifted down to 9.5. EKG showed NSR, HR 83 with 1st degree AV block and no acute ST changes.  She reports the discomfort was a tightness in her chest and occurred while sitting in bed.   Her pain lasted for 10 to 15 minutes and resolved around the time she received Hydrocodone and was also on a Cardene drip for elevated BP.  She denies any recurrent pain overnight or this morning.   Past Medical History:  Diagnosis Date   Anemia    Arthritis    CKD (chronic kidney disease) stage 3, GFR 30-59 ml/min (HCC)    Diabetes mellitus, type 2 ([place])    Essential hypertension    GERD (gastroesophageal reflux disease)    Gout    History of MRSA infection 03/2009   History of stroke 2013   HOCM (hypertrophic obstructive cardiomyopathy) ([place])    Hypercalcemia 2017   Managed by nephrology   Hyperlipidemia    Obesity    Psoriasis    Uterine cancer ([place])    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    Past Surgical History:  Procedure Laterality Date   ABDOMINAL HYSTERECTOMY     APPENDECTOMY  2012   COLONOSCOPY WITH PROPOFOL N/A 08/24/2016   Procedure: COLONOSCOPY WITH PROPOFOL;  Surgeon: Rogene Houston, MD;  Location: [facility];  Service: Endoscopy;  Laterality: N/A;  10:30   Excison of right breast cyst     LAPAROSCOPIC SALPINGO OOPHERECTOMY Right 04/22/2012   Procedure: LAPAROSCOPIC SALPINGO OOPHORECTOMY;  Surgeon: Jonnie Kind, MD;  Location: [facility] ORS;  Service: Gynecology;  Laterality: Right;  end 11:17   MASS EXCISION N/A 04/22/2012   Procedure: EXCISION SKIN TAGS NECK AND HEAD;  Surgeon:  Jonnie Kind, MD;  Location: [facility] ORS;  Service: Gynecology;  Laterality: N/A;  start 11:19   PARTIAL HYSTERECTOMY     POLYPECTOMY  08/24/2016   Procedure: POLYPECTOMY;  Surgeon: Rogene Houston, MD;  Location: [facility];  Service: Endoscopy;;  colon   Right neck biopsy       Home Medications:  Prior to Admission medications   Medication Sig Start Date End Date Taking? Authorizing Provider  aspirin EC 81 MG tablet Take 1 tablet (81 mg total) by mouth daily. Swallow whole. Patient taking differently: Take 325 mg by mouth daily. Swallow whole. 09/13/20  Yes Satira Sark, MD   calcitRIOL (ROCALTROL) 0.25 MCG capsule Take 0.25 mcg by mouth 3 (three) times a week.   Yes [provider]  cyclobenzaprine (FLEXERIL) 10 MG tablet TAKE 1 TABLET BY MOUTH  EVERY NIGHT AT  BEDTIME AS NEEDED FOR SPASM Patient taking differently: Take 10 mg by mouth at bedtime as needed for muscle spasms. 08/15/20  Yes Sanjuana Kava, MD  DILT-XR 180 MG 24 hr capsule Take 1 capsule by mouth once daily Patient taking differently: Take 180 mg by mouth daily. 09/05/20  Yes Fayrene Helper, MD  epoetin alfa (EPOGEN) 3000 UNIT/ML injection Inject 3,000 Units into the vein every 14 (fourteen) days.    Yes Bhutani, Manpreet S, MD  ezetimibe (ZETIA) 10 MG tablet Take 1 tablet (10 mg total) by mouth daily. 11/12/19  Yes Fayrene Helper, MD  febuxostat (ULORIC) 40 MG tablet One daily for gout Patient taking differently: Take 40 mg by mouth daily. One daily for gout 06/14/20  Yes Sanjuana Kava, MD  gabapentin (NEURONTIN) 600 MG tablet Take 300 mg by mouth 2 (two) times daily. 03/21/20  Yes [provider]  hydrALAZINE (APRESOLINE) 25 MG tablet TAKE 1 TABLET BY MOUTH TWICE DAILY (AT  9:00  AM  AND  9:00  PM) Patient taking differently: Take 25 mg by mouth in the morning and at bedtime. 08/15/20  Yes Fayrene Helper, MD  HYDROcodone-acetaminophen (NORCO/VICODIN) 5-325 MG tablet One tablet every six hours for pain.  Limit 7 days. Patient taking differently: Take 1 tablet by mouth every 6 (six) hours as needed for severe pain. Limit 7 days. 09/13/20  Yes Sanjuana Kava, MD  insulin NPH-regular Human (NOVOLIN 70/30 RELION) (70-30) 100 UNIT/ML injection Inject 105 Units into the skin daily with breakfast. 06/29/20  Yes Renato Shin, MD  metoprolol tartrate (LOPRESSOR) 100 MG tablet Take 1 tablet by mouth twice daily Patient taking differently: Take 50 mg by mouth 2 (two) times daily. 05/10/20  Yes Fayrene Helper, MD  potassium chloride (KLOR-CON) 10 MEQ tablet Take 30 mEq by mouth 2 (two)  times daily.   Yes [provider]  pravastatin (PRAVACHOL) 20 MG tablet TAKE 1 TABLET BY MOUTH ONCE DAILY BEFORE BREAKFAST Patient taking differently: Take 20 mg by mouth daily. 08/08/20  Yes Fayrene Helper, MD  prednisoLONE acetate (PRED FORTE) 1 % ophthalmic suspension Place 1 drop into the left eye in the morning and at bedtime. 05/20/20  Yes [provider]  torsemide (DEMADEX) 20 MG tablet Take 2.5 tablets (50 mg total) by mouth 2 (two) times daily. 09/13/20  Yes Satira Sark, MD  metolazone (ZAROXOLYN) 5 MG tablet Take 1 tablet by mouth once daily Patient taking differently: Take 5 mg by mouth daily. 08/15/20   Fayrene Helper, MD  [facility] ULTRA test strip USE 1 STRIP TO CHECK GLUCOSE TWICE DAILY  06/20/20   Renato Shin, MD  spironolactone (ALDACTONE) 25 MG tablet Take 75 mg by mouth daily. 06/23/20 06/23/21  [provider]    Inpatient Medications: Scheduled Meds:  calcitRIOL  0.25 mcg Oral Daily   Chlorhexidine Gluconate Cloth  6 each Topical Daily   diltiazem  180 mg Oral Daily   ezetimibe  10 mg Oral Daily   febuxostat  40 mg Oral Daily   feeding supplement (GLUCERNA SHAKE)  237 mL Oral TID BM   furosemide  40 mg Intravenous Q12H   gabapentin  200 mg Oral BID   hydrALAZINE  50 mg Oral Q8H   influenza vaccine adjuvanted  0.5 mL Intramuscular Tomorrow-1000   insulin aspart  0-5 Units Subcutaneous QHS   insulin aspart  0-6 Units Subcutaneous TID WC   insulin aspart protamine- aspart  50 Units Subcutaneous Q breakfast   metoprolol tartrate  75 mg Oral BID   pravastatin  20 mg Oral QAC breakfast   sodium chloride  1 g Oral TID WC   Continuous Infusions:  niCARDipine Stopped (10/12/20 2347)   PRN Meds: hydrALAZINE, HYDROcodone-acetaminophen, HYDROmorphone (DILAUDID) injection, ondansetron (ZOFRAN) IV  Allergies:    Allergies  Allergen Reactions   Benazepril Swelling   Metronidazole Hives   Mobic [Meloxicam] Hives   Penicillins Hives  and Swelling   Sulfonamide Derivatives Hives    Social History:   Social History   Socioeconomic History   Marital status: Married    Spouse name: Not on file   Number of children: 1   Years of education: Not on file   Highest education level: 11th grade  Occupational History   Occupation: disabled     Employer: UNEMPLOYED  Tobacco Use   Smoking status: Former    Packs/day: 0.25    Years: 12.00    Pack years: 3.00    Types: Cigarettes    Quit date: 07/16/1978    Years since quitting: 42.2   Smokeless tobacco: Never  Vaping Use   Vaping Use: Never used  Substance and Sexual Activity   Alcohol use: No   Drug use: No   Sexual activity: Yes    Birth control/protection: Surgical  Other Topics Concern   Not on file  Social History Narrative   Not on file   Social Determinants of Health   Financial Resource Strain: Low Risk    Difficulty of Paying Living Expenses: Not hard at all  Food Insecurity: No Food Insecurity   Worried About Charity fundraiser in the Last Year: Never true   [place] in the Last Year: Never true  Transportation Needs: No Transportation Needs   Lack of Transportation (Medical): No   Lack of Transportation (Non-Medical): No  Physical Activity: Inactive   Days of Exercise per Week: 0 days   Minutes of Exercise per Session: 0 min  Stress: No Stress Concern Present   Feeling of Stress : Only a little  Social Connections: Socially Isolated   Frequency of Communication with Friends and Family: Never   Frequency of Social Gatherings with Friends and Family: Never   Attends Religious Services: Never   Marine scientist or Organizations: No   Attends Music therapist: Never   Marital Status: Married  Human resources officer Violence: Not At Risk   Fear of Current or Ex-Partner: No   Emotionally Abused: No   Physically Abused: No   Sexually Abused: No    Family History:    Family History  Problem Relation Age of Onset    Hypertension Brother    Gout Brother    Prostate cancer Brother    Hypertension Brother    Prostate cancer Brother    Gout Brother    Hypertension Sister    Gout Sister    Leukemia Sister 70   Pancreatic cancer Sister 58   Gout Sister    Prostate cancer Brother    Diabetes Neg Hx      ROS:  Please see the history of present illness.   All other ROS reviewed and negative.     Physical Exam/Data:   Vitals:   10/13/20 0659 10/13/20 0700 10/13/20 0800 10/13/20 1106  BP:  (!) 166/51 (!) 186/53   Pulse: 81 80 81 83  Resp: 18 (!) 21 19 19   Temp: 98.4 F (36.9 C)   98.8 F (37.1 C)  TempSrc: Oral   Oral  SpO2: 100% 100% 100% 100%  Weight:      Height:        Intake/Output Summary (Last 24 hours) at 10/13/2020 1111 Last data filed at 10/13/2020 0641 Gross per 24 hour  Intake 2895.75 ml  Output 3375 ml  Net -479.25 ml   Last 3 Weights 10/12/2020 09/29/2020 09/13/2020  Weight (lbs) 177 lb 14.6 oz 179 lb 3.2 oz 180 lb 3.2 oz  Weight (kg) 80.7 kg 81.285 kg 81.738 kg     Body mass index is 32.54 kg/m.  General:  Well nourished, well developed female appearing in no acute distress HEENT: normal Lymph: no adenopathy Neck: no JVD Endocrine:  No thryomegaly Vascular: No carotid bruits; FA pulses 2+ bilaterally without bruits  Cardiac:  normal S1, S2; RRR; 2/6 systolic murmur.  Lungs:  clear to auscultation bilaterally, no wheezing, rhonchi or rales  Abd: soft, nontender, no hepatomegaly  Ext: 1+ pitting edema Musculoskeletal:  No deformities, BUE and BLE strength normal and equal Skin: warm and dry  Neuro:  CNs 2-12 intact, no focal abnormalities noted Psych:  Normal affect   EKG:  The EKG was personally reviewed and demonstrates: NSR, HR 83 with 1st degree AV block and no acute ST changes Telemetry:  Telemetry was personally reviewed and demonstrates:  NSR, HR in 70's to 90's with occasional PVC's.   Relevant CV Studies:  Echocardiogram: 08/2019 IMPRESSIONS     1.  There is mild SAM of the anterior mitral valve leaflet in the setting  of severe symmertic LVH and hyperdynamic LV function. Dynamic LVOT  gradient of 38 mmHg. Marland Kitchen Left ventricular ejection fraction, by estimation,  is 70 to 75%. The left ventricle has  hyperdynamic function. The left ventricle has no regional wall motion  abnormalities. There is severe left ventricular hypertrophy. Left  ventricular diastolic parameters are consistent with Grade I diastolic  dysfunction (impaired relaxation). Elevated  left atrial pressure.   2. Right ventricular systolic function is normal. The right ventricular  size is normal.   3. Left atrial size was severely dilated.   4. The mitral valve is normal in structure. Trivial mitral valve  regurgitation. Mild mitral stenosis.   5. The aortic valve is tricuspid. Aortic valve regurgitation is not  visualized. No aortic stenosis is present.   6. The inferior vena cava is normal in size with greater than 50%  respiratory variability, suggesting right atrial pressure of 3 mmHg.   Laboratory Data:  High Sensitivity Troponin:   Recent Labs  Lab 10/12/20 2359 10/13/20 0205 10/13/20 0456  TROPONINIHS 100* 123* 122*  Chemistry Recent Labs  Lab 10/10/2020 1738 10/12/20 0422 10/13/20 0205  NA 127* 126* 123*  K 4.8 4.5 4.9  CL 94* 91* 95*  CO2 21* 23 20*  GLUCOSE 139* 54* 190*  BUN 93* 94* 94*  CREATININE 4.48* 4.46* 4.32*  CALCIUM 9.2 9.1 8.3*  GFRNONAA 10* 10* 10*  ANIONGAP 12 12 8     Recent Labs  Lab 10/10/2020 1738 10/12/20 0422  PROT 7.0 6.4*  ALBUMIN 3.3* 3.1*  AST 39 24  ALT 43 34  ALKPHOS 82 76  BILITOT 0.4 0.5   Hematology Recent Labs  Lab 10/12/20 1913 10/13/20 0205 10/13/20 0814  WBC 15.4* 17.0* 17.6*  RBC 3.46* 3.25* 3.28*  HGB 10.1* 9.5* 9.5*  HCT 30.6* 28.6* 28.9*  MCV 88.4 88.0 88.1  MCH 29.2 29.2 29.0  MCHC 33.0 33.2 32.9  RDW 13.7 13.6 13.8  PLT 206 214 220   BNPNo results for input(s): BNP, PROBNP in the  last 168 hours.  DDimer No results for input(s): DDIMER in the last 168 hours.   Radiology/Studies:  CT Abdomen Pelvis Wo Contrast  Result Date: 10/04/2020 CLINICAL DATA:  abdominal pain that began today Pt had renal biopsy 3 days ago EXAM: CT ABDOMEN AND PELVIS WITHOUT CONTRAST TECHNIQUE: Multidetector CT imaging of the abdomen and pelvis was performed following the standard protocol without IV contrast. COMPARISON:  CT lumbar spine 09/23/2019, CT abdomen pelvis 07/22/2019 FINDINGS: Lower chest: Redemonstration of calcified right lower lobe pulmonary nodules (4:14, 8). Similar finding on the left in the lower lobe (4:4). Hepatobiliary: No focal liver abnormality. No gallstones, gallbladder wall thickening, or pericholecystic fluid. No biliary dilatation. Pancreas: No focal lesion. Normal pancreatic contour. No surrounding inflammatory changes. No main pancreatic ductal dilatation. Spleen: Normal in size without focal abnormality. Adrenals/Urinary Tract: No adrenal nodule bilaterally. Left perinephric fat stranding. There is a hyperdense 8 x 4 x 12cm subcapsular hematoma along the left kidney leading to mass effect on the renal parenchyma. No nephrolithiasis and no hydronephrosis. No definite contour-deforming renal mass. No ureterolithiasis or hydroureter. The urinary bladder is unremarkable. Stomach/Bowel: PO contrast reaches the transverse colon. Stomach is within normal limits. No evidence of bowel wall thickening or dilatation. Diffuse sigmoid diverticulosis. Appendix appears normal. Vascular/Lymphatic: No abdominal aorta or iliac aneurysm. Severe atherosclerotic plaque of the aorta and its branches. No abdominal, pelvic, or inguinal lymphadenopathy. Reproductive: There is a 2.8 cm right adnexal cystic lesion. The uterus is not well visualized and likely surgically removed. The left adnexal region is unremarkable. Other: Hyperdense free fluid noted coursing into the pelvis no intraperitoneal free fluid.  No intraperitoneal free gas. No organized fluid collection. Musculoskeletal: Small fat containing umbilical hernia. No suspicious lytic or blastic osseous lesions. No acute displaced fracture. Multilevel degenerative changes of the spine. Grade 1 anterolisthesis of L4 on L5. IMPRESSION: 1. Acute 8 x 4 x12 cm left renal subcapsular hematoma. 2. Scattered colonic diverticulosis with no acute diverticulitis. 3. Stable bilateral lower lobe calcified pulmonary nodules. 4. A 2.8 cm right adnexal cystic lesion. No follow-up imaging recommended. Note: This recommendation does not apply to premenarchal patients and to those with increased risk (genetic, family history, elevated tumor markers or other high-risk factors) of ovarian cancer. Reference: JACR 2020 Feb; 17(2):248-254. 5.  Aortic Atherosclerosis (ICD10-I70.0). These results were called by telephone at the time of interpretation on 10/02/2020 at 8:42 pm to provider [facility] , who verbally acknowledged these results. Electronically Signed   By: Iven Finn M.D.   On: 10/05/2020  20:53   US RENAL  Result Date: 10/12/2020 CLINICAL DATA:  AK I on CKD, history of left kidney biopsy 10/07/2020 EXAM: RENAL / URINARY TRACT ULTRASOUND COMPLETE COMPARISON:  CT abdomen/pelvis 1 day prior FINDINGS: Right Kidney: Renal measurements: 10.4 cm x 5.4 cm x 5.3 cm = volume: 155 mL. Echogenicity within normal limits. No mass or hydronephrosis visualized. Left Kidney: Renal measurements: 12.8 cm x 6.2 cm x 6.0 cm = volume: 249 mL. There is a subcapsular hematoma along the left kidney measuring approximately 10.6 cm x 2.8 cm x 6.5 cm, as seen on the CT obtained 1 day prior. Bladder: Appears normal for degree of bladder distention. Other: None. IMPRESSION: Subcapsular hematoma along the left kidney, grossly similar in size allowing for difference in modality. Electronically Signed   By: Valetta Mole M.D.   On: 10/12/2020 11:15   DG CHEST PORT 1 VIEW  Result Date:  10/12/2020 CLINICAL DATA:  Chest pain. EXAM: PORTABLE CHEST 1 VIEW COMPARISON:  Chest x-ray 01/06/2004.  Chest x-ray 09/23/2019. FINDINGS: The heart is enlarged, unchanged. There is central pulmonary vascular congestion. There is a new small left pleural effusion with patchy opacities in the left lung base. There is no evidence for pneumothorax. No acute fractures are identified. IMPRESSION: 1.  Cardiomegaly with central pulmonary vascular congestion. 2. New small left pleural effusion with left basilar atelectasis/airspace disease. Electronically Signed   By: Ronney Asters M.D.   On: 10/12/2020 23:56     Assessment and Plan:   1. Chest Pain/Elevated Troponin Values - She developed chest pain during her admission which occurred while at rest and resolved with treatment of elevated BP and following administration of Hydrocodone. No recurrent pain since. - Her cardiac enzymes have been flat and peaked at 123. Suspect this is secondary to demand ischemia in the setting of her renal dysfunction, HOCM, renal hematoma and anemia. Will plan to obtain an echocardiogram for reassessment of her EF and cardiomyopathy. No plans for ischemic evaluation this admission as she would be high-risk for contrast-induced nephropathy given her worsening CKD.  2. HOCM - She has known hypertrophic obstructive cardiomyopathy and has been on Cardizem and Lopressor as an outpatient. She is currently on Cardizem CD 180 mg daily and Lopressor 50 mg twice daily. Will plan to titrate Lopressor to 75 mg twice daily given her cardiomyopathy and elevated BP.  Will also plan to obtain a repeat echocardiogram as outlined above.  3. HTN - Her blood pressure has been elevated this admission, at 186/53 on most recent check. She did receive her Cardizem CD, Hydralazine, and Lopressor this morning. Will continue to follow and plan to titrate medications as needed.  4. Acute on Chronic Stage 4 CKD - Her creatinine was at 2.92 approximately 2  months ago and is elevated to 4.32 today. She was initially receiving IV fluids this admission and Nephrology is planning to restart Lasix 40 mg twice daily today. Agree with starting diuresis as she is volume overloaded on examination today. Will defer diuretic dose adjustment to Nephrology.  5. Left renal subscapular hematoma - Underwent renal biopsy on 10/07/2020. IR following with no plans for intervention at this time.   For questions or updates, please contact [place] Please consult [email or web address] for contact info under    Signed, Erma Heritage, PA-C  10/13/2020 11:11 AM    Attending note:  Patient seen and examined.  Case discussed with Ms. Ahmed Prima PA-C, I agree with her above findings.  Cardiology consulted due  to recent episode of chest discomfort and minor high-sensitivity troponin I elevation in flat pattern.  She is currently admitted to the hospital with abdominal pain following recent renal biopsy and with associated left renal subcapsular hematoma.  Blood pressure has been elevated including at the time of her chest discomfort.  She was treated with hydrocodone and temporarily on Cardene infusion, feels better today.  Peak high-sensitivity troponin I was only 123.  On examination this morning she does not report any active chest pain.  Afebrile.  Heart rate in the 80s to 90s in sinus rhythm by telemetry which I personally reviewed.  Systolic blood pressure 979Y to 180s.  Lungs are clear.  Cardiac exam with RRR and 2/6 to 3/6 systolic murmur.  Pertinent lab work includes BUN 94, creatinine 4.32, potassium 4.9, WBC 17.6, hemoglobin 9.5, platelets 220.  I personally reviewed her recent tracings which shows sinus rhythm with IVCD and prolonged PR interval, no acute ST segment changes.  She has a known history of HOCM, echocardiogram from August of last year revealed vigorous LVEF with severe symmetric LVH and grade 1 diastolic dysfunction.  Mild SAM of anterior mitral  leaflet noted with dynamic LVOT gradient.  Suspect minor elevation in troponin is secondary to demand ischemia/myocardial strain rather than ACS.  She has a known history of HOCM and has had recent elevation in blood pressure and heart rate.  Continue Cardizem CD 180 mg daily, increase Lopressor to 75 mg twice daily, repeat echocardiogram to ensure stability in cardiac structure and function.  She is also on hydralazine and has been placed on Lasix by Nephrology with some evidence of fluid overload.  Do not anticipate any further ischemic testing.  We will follow.  Satira Sark, M.D., F.A.C.C.

## 2020-10-14 ENCOUNTER — Inpatient Hospital Stay (HOSPITAL_COMMUNITY): Payer: HMO

## 2020-10-14 DIAGNOSIS — N185 Chronic kidney disease, stage 5: Secondary | ICD-10-CM | POA: Diagnosis not present

## 2020-10-14 DIAGNOSIS — I469 Cardiac arrest, cause unspecified: Secondary | ICD-10-CM | POA: Diagnosis not present

## 2020-10-14 DIAGNOSIS — N179 Acute kidney failure, unspecified: Secondary | ICD-10-CM | POA: Diagnosis not present

## 2020-10-14 DIAGNOSIS — R1030 Lower abdominal pain, unspecified: Secondary | ICD-10-CM | POA: Diagnosis not present

## 2020-10-14 DIAGNOSIS — I421 Obstructive hypertrophic cardiomyopathy: Secondary | ICD-10-CM | POA: Diagnosis not present

## 2020-10-14 DIAGNOSIS — I10 Essential (primary) hypertension: Secondary | ICD-10-CM | POA: Diagnosis not present

## 2020-10-14 LAB — ECHOCARDIOGRAM COMPLETE
AR max vel: 3.4 cm2
AV Area VTI: 3.27 cm2
AV Area mean vel: 3.49 cm2
AV Mean grad: 7 mmHg
AV Peak grad: 13.7 mmHg
Ao pk vel: 1.85 m/s
Area-P 1/2: 3.02 cm2
Height: 62 in
MV VTI: 1.99 cm2
S' Lateral: 2.8 cm
Weight: 2846.58 oz

## 2020-10-14 LAB — CBC
HCT: 25.9 % — ABNORMAL LOW (ref 36.0–46.0)
HCT: 25.2 % — ABNORMAL LOW (ref 36.0–46.0)
Hemoglobin: 8.5 g/dL — ABNORMAL LOW (ref 12.0–15.0)
Hemoglobin: 7.8 g/dL — ABNORMAL LOW (ref 12.0–15.0)
MCH: 29.4 pg (ref 26.0–34.0)
MCH: 28.9 pg (ref 26.0–34.0)
MCHC: 32.8 g/dL (ref 30.0–36.0)
MCHC: 31 g/dL (ref 30.0–36.0)
MCV: 89.6 fL (ref 80.0–100.0)
MCV: 93.3 fL (ref 80.0–100.0)
Platelets: 199 10*3/uL (ref 150–400)
Platelets: 228 10*3/uL (ref 150–400)
RBC: 2.89 MIL/uL — ABNORMAL LOW (ref 3.87–5.11)
RBC: 2.7 MIL/uL — ABNORMAL LOW (ref 3.87–5.11)
RDW: 14.1 % (ref 11.5–15.5)
RDW: 14.2 % (ref 11.5–15.5)
WBC: 13.4 10*3/uL — ABNORMAL HIGH (ref 4.0–10.5)
WBC: 18.3 10*3/uL — ABNORMAL HIGH (ref 4.0–10.5)
nRBC: 0.1 % (ref 0.0–0.2)
nRBC: 0.5 % — ABNORMAL HIGH (ref 0.0–0.2)

## 2020-10-14 LAB — BASIC METABOLIC PANEL
Anion gap: 13 (ref 5–15)
BUN: 85 mg/dL — ABNORMAL HIGH (ref 8–23)
CO2: 19 mmol/L — ABNORMAL LOW (ref 22–32)
Calcium: 8.4 mg/dL — ABNORMAL LOW (ref 8.9–10.3)
Chloride: 94 mmol/L — ABNORMAL LOW (ref 98–111)
Creatinine, Ser: 5.25 mg/dL — ABNORMAL HIGH (ref 0.44–1.00)
GFR, Estimated: 8 mL/min — ABNORMAL LOW (ref >60)
Glucose, Bld: 175 mg/dL — ABNORMAL HIGH (ref 70–99)
Potassium: 4.9 mmol/L (ref 3.5–5.1)
Sodium: 126 mmol/L — ABNORMAL LOW (ref 135–145)

## 2020-10-14 LAB — GLUCOSE, CAPILLARY
Glucose-Capillary: 163 mg/dL — ABNORMAL HIGH (ref 70–99)
Glucose-Capillary: 180 mg/dL — ABNORMAL HIGH (ref 70–99)
Glucose-Capillary: 144 mg/dL — ABNORMAL HIGH (ref 70–99)
Glucose-Capillary: 186 mg/dL — ABNORMAL HIGH (ref 70–99)
Glucose-Capillary: 199 mg/dL — ABNORMAL HIGH (ref 70–99)

## 2020-10-14 MED ORDER — ETOMIDATE 2 MG/ML IV SOLN
INTRAVENOUS | Status: AC
  Filled 2020-10-14: qty 20

## 2020-10-14 MED ORDER — SODIUM CHLORIDE 0.9 % IV SOLN
250.0000 mL | INTRAVENOUS | Status: DC
  Administered 2020-10-15 – 2020-10-30 (×4): 250 mL via INTRAVENOUS

## 2020-10-14 MED ORDER — HYDROMORPHONE HCL 1 MG/ML IJ SOLN
INTRAMUSCULAR | Status: AC
  Administered 2020-10-15: 1 mg via INTRAVENOUS
  Filled 2020-10-14: qty 1

## 2020-10-14 MED ORDER — FENTANYL CITRATE PF 50 MCG/ML IJ SOSY
PREFILLED_SYRINGE | INTRAMUSCULAR | Status: AC
  Filled 2020-10-14: qty 2

## 2020-10-14 MED ORDER — ALBUTEROL SULFATE (2.5 MG/3ML) 0.083% IN NEBU
2.5000 mg | INHALATION_SOLUTION | Freq: Four times a day (QID) | RESPIRATORY_TRACT | Status: DC | PRN
  Administered 2020-10-15 – 2020-10-18 (×3): 2.5 mg via RESPIRATORY_TRACT
  Filled 2020-10-14 (×3): qty 3

## 2020-10-14 MED ORDER — FUROSEMIDE 10 MG/ML IJ SOLN
80.0000 mg | Freq: Two times a day (BID) | INTRAMUSCULAR | Status: AC
Start: 2020-10-14 — End: 2020-10-15
  Administered 2020-10-14 – 2020-10-15 (×3): 80 mg via INTRAVENOUS
  Filled 2020-10-14 (×3): qty 8

## 2020-10-14 MED ORDER — ROCURONIUM BROMIDE 10 MG/ML (PF) SYRINGE
PREFILLED_SYRINGE | INTRAVENOUS | Status: AC
  Filled 2020-10-14: qty 10

## 2020-10-14 MED ORDER — SODIUM BICARBONATE 650 MG PO TABS
650.0000 mg | ORAL_TABLET | Freq: Three times a day (TID) | ORAL | Status: DC
  Administered 2020-10-14 – 2020-10-15 (×4): 650 mg via ORAL
  Filled 2020-10-14 (×4): qty 1

## 2020-10-14 MED ORDER — METOPROLOL TARTRATE 50 MG PO TABS
75.0000 mg | ORAL_TABLET | Freq: Two times a day (BID) | ORAL | Status: DC
  Administered 2020-10-14 – 2020-10-15 (×3): 75 mg via ORAL
  Filled 2020-10-14 (×3): qty 1

## 2020-10-14 MED ORDER — ALBUMIN HUMAN 25 % IV SOLN
25.0000 g | Freq: Four times a day (QID) | INTRAVENOUS | Status: AC
  Administered 2020-10-14 (×2): 25 g via INTRAVENOUS
  Filled 2020-10-14 (×2): qty 100

## 2020-10-14 MED ORDER — DEXMEDETOMIDINE HCL IN NACL 200 MCG/50ML IV SOLN
0.4000 ug/kg/h | INTRAVENOUS | Status: DC
  Filled 2020-10-14: qty 50

## 2020-10-14 MED ORDER — DEXMEDETOMIDINE HCL IN NACL 400 MCG/100ML IV SOLN
0.4000 ug/kg/h | INTRAVENOUS | Status: DC
  Administered 2020-10-15: 1.2 ug/kg/h via INTRAVENOUS
  Administered 2020-10-15: 0.9 ug/kg/h via INTRAVENOUS
  Administered 2020-10-15: 0.8 ug/kg/h via INTRAVENOUS
  Administered 2020-10-16 – 2020-10-17 (×7): 1.2 ug/kg/h via INTRAVENOUS
  Administered 2020-10-18: 1 ug/kg/h via INTRAVENOUS
  Administered 2020-10-18: 1.2 ug/kg/h via INTRAVENOUS
  Administered 2020-10-18: 1 ug/kg/h via INTRAVENOUS
  Administered 2020-10-19 – 2020-10-21 (×9): 1.2 ug/kg/h via INTRAVENOUS
  Filled 2020-10-14 (×6): qty 100
  Filled 2020-10-14 (×2): qty 200
  Filled 2020-10-14 (×5): qty 100
  Filled 2020-10-14: qty 200
  Filled 2020-10-14 (×5): qty 100

## 2020-10-14 MED ORDER — SUCCINYLCHOLINE CHLORIDE 200 MG/10ML IV SOSY
PREFILLED_SYRINGE | INTRAVENOUS | Status: AC
  Filled 2020-10-14: qty 10

## 2020-10-14 MED ORDER — NOREPINEPHRINE 4 MG/250ML-% IV SOLN
2.0000 ug/min | INTRAVENOUS | Status: DC
  Administered 2020-10-15: 2 ug/min via INTRAVENOUS
  Administered 2020-10-15: 6 ug/min via INTRAVENOUS
  Filled 2020-10-14 (×3): qty 250

## 2020-10-14 MED ORDER — HYDROMORPHONE HCL 1 MG/ML IJ SOLN: 1.0000 mg | Freq: Once | INTRAMUSCULAR | Status: AC

## 2020-10-14 MED ORDER — MIDAZOLAM HCL 2 MG/2ML IJ SOLN
INTRAMUSCULAR | Status: AC
  Filled 2020-10-14: qty 2

## 2020-10-14 NOTE — Care Management Important Message (Signed)
Important Message  Patient Details  Name: Krista Strickland MRN: [id number] Date of Birth: [birth year]   Medicare Important Message Given:  Yes     Tommy Medal 10/14/2020, 3:00 PM

## 2020-10-14 NOTE — Progress Notes (Addendum)
[facility] NEPHROLOGY PROGRESS NOTE  Assessment/ Plan: Pt is a 71 y.o. yo female  with history of hypertension, diabetes, anemia, secondary hyperparathyroidism, stroke, hypertrophic obstructive cardiomyopathy, HLD, obesity, uterine cancer, CKD stage IV, seen as a consultation for the evaluation of AKI on CKD 4.   #Acute kidney injury on CKD stage IV: Patient has longstanding CKD with baseline creatinine level seems to be around 2.6-2.9 however recently the GFR declined and creatinine level around 3.6 as outpatient.  The CKD with subnephrotic proteinuria was thought to be due to diabetes and HTN followed by Dr. Theador Hawthorne.  She underwent IR guided kidney biopsy on 9/9 at [facility] and now admitted with subcapsular hematoma and anemia. I called patient's nephrologist Dr. Theador Hawthorne today.  Reportedly the biopsy result came back hypertensive and diabetic changes with severe interstitial fibrosis and tubular atrophy with mostly chronic changes.  She has a rapid decline in kidney function with borderline urine output on IV diuretics.  Bladder scan is being done every 8 hourly.  She still has lower extremity edema therefore I will increase IV Lasix to 80 mg twice a day, add 2 doses of albumin to augment diuresis and start sodium bicarbonate for metabolic acidosis.  Continue with strict ins and outs and daily lab.  I have discussed with the patient that she may need dialysis in the next few days if no improvement in kidney function.  Her OP nephrologist also agrees with the same.    #Post biopsy left kidney hematoma: 8 x 4 x 12 cm subcapsular hematoma along the left kidney leading to mass-effect on the renal parenchyma.  IR was consulted, no plan for embolization.  Continue to monitor CBC and imaging studies.   #Hyponatremia, hypervolemic: Seems to be chronic.  Asymptomatic.  On torsemide and metolazone as outpatient.  Serum sodium level improving with loop diuretics.  I am increasing Lasix dose  today.  Monitor lab.  #Acute urinary retention: Bladder scan with more than 999 cc of urine on admission.  Bladder scan every 8 hour.  #Metabolic acidosis: Start oral sodium bicarbonate.   #Hypertension: BP elevated.  Currently on diltiazem, hydralazine and metoprolol per cardiology.  Increasing diuretics to manage volume as discussed above.  Blood pressure improving.    #Anemia of CKD and blood loss postbiopsy: Monitor CBC and transfuse as needed.  I have discussed with the primary team as well as the nursing staff.  Subjective: Seen and examined at bedside.  Urine output only 650 cc in 24 hours.  Doing bladder scan.  She denies nausea, vomiting, chest pain, shortness of breath.  Some abdomen discomfort. Objective Vital signs in last 24 hours: Vitals:   10/14/20 0500 10/14/20 0551 10/14/20 0700 10/14/20 0800  BP: (!) 159/48 (!) 159/48 (!) 171/61 (!) 172/59  Pulse: 79  81 83  Resp: (!) 22  (!) 22 20  Temp:    98.3 F (36.8 C)  TempSrc:    Axillary  SpO2: 98%  100% 99%  Weight:      Height:       Weight change:   Intake/Output Summary (Last 24 hours) at 10/14/2020 0940 Last data filed at 10/14/2020 0500 Gross per 24 hour  Intake --  Output 650 ml  Net -650 ml        Labs: Basic Metabolic Panel: Recent Labs  Lab 10/12/20 0422 10/13/20 0205 10/13/20 0814 10/14/20 0505  NA 126* 123* 124* 126*  K 4.5 4.9 5.0 4.9  CL 91* 95* 94* 94*  CO2 23 20* 20* 19*  GLUCOSE 54* 190* 161* 175*  BUN 94* 94* 97* 85*  CREATININE 4.46* 4.32* 4.66* 5.25*  CALCIUM 9.1 8.3* 8.7* 8.4*  PHOS 6.2*  --  6.8*  --     Liver Function Tests: Recent Labs  Lab 09/29/2020 1738 10/12/20 0422 10/13/20 0814  AST 39 24  --   ALT 43 34  --   ALKPHOS 82 76  --   BILITOT 0.4 0.5  --   PROT 7.0 6.4*  --   ALBUMIN 3.3* 3.1* 2.8*    Recent Labs  Lab 10/09/2020 1738  LIPASE 37    No results for input(s): AMMONIA in the last 168 hours. CBC: Recent Labs  Lab 10/08/2020 1738 10/12/20 0422  10/12/20 1122 10/12/20 1913 10/13/20 0205 10/13/20 0814 10/14/20 0505  WBC 15.4*   < > 13.3* 15.4* 17.0* 17.6* 13.4*  NEUTROABS 12.7*  --   --   --   --   --   --   HGB 11.8*   < > 10.1* 10.1* 9.5* 9.5* 8.5*  HCT 35.7*   < > 31.3* 30.6* 28.6* 28.9* 25.9*  MCV 88.1   < > 88.2 88.4 88.0 88.1 89.6  PLT 270   < > 216 206 214 220 199   < > = values in this interval not displayed.    Cardiac Enzymes: No results for input(s): CKTOTAL, CKMB, CKMBINDEX, TROPONINI in the last 168 hours. CBG: Recent Labs  Lab 10/13/20 0701 10/13/20 1105 10/13/20 1648 10/13/20 2134 10/14/20 0806  GLUCAP 147* 214* 175* 168* 163*     Iron Studies: No results for input(s): IRON, TIBC, TRANSFERRIN, FERRITIN in the last 72 hours. Studies/Results: US RENAL  Result Date: 10/12/2020 CLINICAL DATA:  AK I on CKD, history of left kidney biopsy 10/07/2020 EXAM: RENAL / URINARY TRACT ULTRASOUND COMPLETE COMPARISON:  CT abdomen/pelvis 1 day prior FINDINGS: Right Kidney: Renal measurements: 10.4 cm x 5.4 cm x 5.3 cm = volume: 155 mL. Echogenicity within normal limits. No mass or hydronephrosis visualized. Left Kidney: Renal measurements: 12.8 cm x 6.2 cm x 6.0 cm = volume: 249 mL. There is a subcapsular hematoma along the left kidney measuring approximately 10.6 cm x 2.8 cm x 6.5 cm, as seen on the CT obtained 1 day prior. Bladder: Appears normal for degree of bladder distention. Other: None. IMPRESSION: Subcapsular hematoma along the left kidney, grossly similar in size allowing for difference in modality. Electronically Signed   By: Valetta Mole M.D.   On: 10/12/2020 11:15   DG CHEST PORT 1 VIEW  Result Date: 10/12/2020 CLINICAL DATA:  Chest pain. EXAM: PORTABLE CHEST 1 VIEW COMPARISON:  Chest x-ray 01/06/2004.  Chest x-ray 09/23/2019. FINDINGS: The heart is enlarged, unchanged. There is central pulmonary vascular congestion. There is a new small left pleural effusion with patchy opacities in the left lung base. There is  no evidence for pneumothorax. No acute fractures are identified. IMPRESSION: 1.  Cardiomegaly with central pulmonary vascular congestion. 2. New small left pleural effusion with left basilar atelectasis/airspace disease. Electronically Signed   By: Ronney Asters M.D.   On: 10/12/2020 23:56    Medications: Infusions:  albumin human      Scheduled Medications:  calcitRIOL  0.25 mcg Oral Daily   Chlorhexidine Gluconate Cloth  6 each Topical Daily   diltiazem  180 mg Oral Daily   ezetimibe  10 mg Oral Daily   febuxostat  40 mg Oral Daily   feeding supplement (GLUCERNA  SHAKE)  237 mL Oral TID BM   furosemide  80 mg Intravenous Q12H   gabapentin  200 mg Oral BID   hydrALAZINE  50 mg Oral Q8H   influenza vaccine adjuvanted  0.5 mL Intramuscular Tomorrow-1000   insulin aspart  0-5 Units Subcutaneous QHS   insulin aspart  0-6 Units Subcutaneous TID WC   insulin aspart protamine- aspart  50 Units Subcutaneous Q breakfast   metoprolol tartrate  75 mg Oral BID   pravastatin  20 mg Oral QAC breakfast   sodium bicarbonate  650 mg Oral TID    have reviewed scheduled and prn medications.  Physical Exam: General:NAD, comfortable Heart:RRR, s1s2 nl, no rub Lungs:clear b/l, no crackle Abdomen:soft, Non-tender, non-distended Extremities: Bilateral ankle edema present+ Neurology: Alert, awake, nonfocal  Orva Riles Tanna Furry 10/14/2020,9:40 AM  LOS: 2 days

## 2020-10-14 NOTE — NC FL2 (Signed)
[place] MEDICAID FL2 LEVEL OF CARE SCREENING TOOL     IDENTIFICATION  Patient Name: Krista Strickland Birthdate: [birth year] Sex: female Admission Date (Current Location): 10/08/2020  [facility] and [place] Number:  [employer] and Address:  [place] [address] [place]      Provider Number: [id number]  Attending Physician Name and Address:  Deatra James, MD  Relative Name and Phone Number:  Kymia, Simi (Spouse)   [phone]    Current Level of Care: Hospital Recommended Level of Care: [place] Prior Approval Number:    Date Approved/Denied: 10/14/20 PASRR Number: [id number]  Discharge Plan: SNF    Current Diagnoses: Patient Active Problem List   Diagnosis Date Noted   Leukocytosis 10/12/2020   Abdominal pain 10/23/2020   Renal hematoma, left, initial encounter 10/27/2020   Hyponatremia 10/06/2020   Hypoalbuminemia due to protein-calorie malnutrition ([place]) 10/10/2020   Hyperglycemia due to diabetes mellitus ([place]) 10/03/2020   GERD (gastroesophageal reflux disease) 10/09/2020   Acute kidney injury superimposed on CKD ([place]) 10/07/2020   Pre-op examination 03/17/2020   Corneal edema of left eye 02/05/2020   Sleep-disordered breathing 09/02/2019   Type 2 diabetes mellitus with diabetic chronic kidney disease ([place]) 08/17/2019   Symptomatic anemia 72/53/6644   Diastolic dysfunction 03/47/4259   Anemia in chronic kidney disease 12/18/2018   Hypercalcemia 12/18/2018   Proteinuria 12/18/2018   Secondary hyperparathyroidism ([place]) 12/18/2018   Vitamin D deficiency 12/18/2018   CKD (chronic kidney disease) stage 4, GFR 15-29 ml/min (HCC) 12/18/2018   Hypertensive retinopathy of both eyes 11/14/2018   Nuclear sclerotic cataract of both eyes 11/14/2018   Iritis of left eye 11/14/2018   Diabetic neuropathy ([place]) 11/09/2015   Degenerative spondylolisthesis 09/21/2015   Leg edema 11/30/2014   Lumbar  radiculopathy 11/11/2014   Multinodular goiter (nontoxic) 09/20/2013   Hypertriglyceridemia 08/05/2013   Back pain 04/16/2013   CVA (cerebral vascular accident) ([place]) 07/23/2011   SLEEP APNEA 09/06/2009   Hyperuricemia 11/04/2008   Mixed hyperlipidemia 07/02/2007   Obesity (BMI 30.0-34.9) 07/02/2007   Essential hypertension 07/02/2007    Orientation RESPIRATION BLADDER Height & Weight     Self, Time, Situation, Place  Normal Continent Weight: 177 lb 14.6 oz (80.7 kg) Height:  5\' 2"  (157.5 cm)  BEHAVIORAL SYMPTOMS/MOOD NEUROLOGICAL BOWEL NUTRITION STATUS      Continent Diet  AMBULATORY STATUS COMMUNICATION OF NEEDS Skin   Extensive Assist Verbally Normal                       Personal Care Assistance Level of Assistance  Bathing, Feeding, Dressing   Feeding assistance: Independent Dressing Assistance: Maximum assistance     Functional Limitations Info  Sight, Hearing, Speech Sight Info: Adequate Hearing Info: Adequate Speech Info: Adequate    SPECIAL CARE FACTORS FREQUENCY  PT (By licensed PT)     PT Frequency: 5x              Contractures Contractures Info: Not present    Additional Factors Info  Code Status, Allergies, Psychotropic Code Status Info: 5x Allergies Info: Benazepril  Metronidazole  Mobic (meloxicam)  Penicillins  Sulfonamide Derivatives Psychotropic Info: gabapentin         Current Medications (10/14/2020):  This is the current hospital active medication list Current Facility-Administered Medications  Medication Dose Route Frequency Provider Last Rate Last Admin   albumin human 25 % solution 25 g  25 g Intravenous Q6H Rosita Fire, MD  60 mL/hr at 10/14/20 1137 25 g at 10/14/20 1137   calcitRIOL (ROCALTROL) capsule 0.25 mcg  0.25 mcg Oral Daily Shahmehdi, Seyed A, MD   0.25 mcg at 10/14/20 0017   Chlorhexidine Gluconate Cloth 2 % PADS 6 each  6 each Topical Daily Skipper Cliche A, MD   6 each at 10/14/20 0929   diltiazem  (DILACOR XR) 24 hr capsule 180 mg  180 mg Oral Daily Shahmehdi, Seyed A, MD   180 mg at 10/14/20 0928   ezetimibe (ZETIA) tablet 10 mg  10 mg Oral Daily Adefeso, Oladapo, DO   10 mg at 10/14/20 0928   febuxostat (ULORIC) tablet 40 mg  40 mg Oral Daily Shahmehdi, Seyed A, MD   40 mg at 10/14/20 0928   feeding supplement (GLUCERNA SHAKE) (GLUCERNA SHAKE) liquid 237 mL  237 mL Oral TID BM Adefeso, Oladapo, DO   237 mL at 10/13/20 0917   furosemide (LASIX) injection 80 mg  80 mg Intravenous Q12H Rosita Fire, MD       gabapentin (NEURONTIN) capsule 200 mg  200 mg Oral BID Skipper Cliche A, MD   200 mg at 10/14/20 4944   hydrALAZINE (APRESOLINE) injection 10 mg  10 mg Intravenous Q4H PRN Skipper Cliche A, MD   10 mg at 10/13/20 0631   hydrALAZINE (APRESOLINE) tablet 50 mg  50 mg Oral Q8H Rosita Fire, MD   50 mg at 10/14/20 0551   HYDROcodone-acetaminophen (NORCO/VICODIN) 5-325 MG per tablet 1 tablet  1 tablet Oral Q6H PRN Deatra James, MD   1 tablet at 10/13/20 2136   HYDROmorphone (DILAUDID) injection 0.5 mg  0.5 mg Intravenous Q4H PRN Adefeso, Oladapo, DO   0.5 mg at 10/12/20 0732   influenza vaccine adjuvanted (FLUAD) injection 0.5 mL  0.5 mL Intramuscular Tomorrow-1000 Shahmehdi, Seyed A, MD       insulin aspart (novoLOG) injection 0-5 Units  0-5 Units Subcutaneous QHS Adefeso, Oladapo, DO       insulin aspart (novoLOG) injection 0-6 Units  0-6 Units Subcutaneous TID WC Adefeso, Oladapo, DO   1 Units at 10/14/20 1142   insulin aspart protamine- aspart (NOVOLOG MIX 70/30) injection 50 Units  50 Units Subcutaneous Q breakfast Shahmehdi, Seyed A, MD   50 Units at 10/14/20 0930   metoprolol tartrate (LOPRESSOR) tablet 75 mg  75 mg Oral BID Bernerd Pho M, PA-C   75 mg at 10/14/20 0928   ondansetron (ZOFRAN) injection 4 mg  4 mg Intravenous Q6H PRN Adefeso, Oladapo, DO       pravastatin (PRAVACHOL) tablet 20 mg  20 mg Oral QAC breakfast Adefeso, Oladapo, DO   20 mg at  10/14/20 9675   sodium bicarbonate tablet 650 mg  650 mg Oral TID Rosita Fire, MD   650 mg at 10/14/20 1137     Discharge Medications: Please see discharge summary for a list of discharge medications.  Relevant Imaging Results:  Relevant Lab Results:   Additional Information PT SSN [id number]  Natasha Bence, LCSW

## 2020-10-14 NOTE — Progress Notes (Addendum)
Code blue Please see nursing note for full details.  Patient, reportedly, got up to go to the bathroom, and then felt like she couldn't breathe on the way to the bathroom. They got her back in bed, she brady'ed down to asystole. After ACLS/CPR ROSC was achieved. Patient was breathing on her own, but was completely obtunded. BP was stable initially in the 90s and then the 110s. The decision was made to intubate to protect her airway.   CXR, labs, EKG ordered - will follow up on these CTA ordered as the story is consistent with PE. Cr is >5.0. I have called to discuss the cost/benefit ratio with radiology and the specific policy. The policy is "1) patients on dialysis with no residual renal function--full dose non-ionic contrast 2) if patient reports they have residual renal function, or have been on dialysis less than 6 months,  use contrast only after risk/benefit assessment by informed provider and/or radiologist. If feasible,  informed consent should be obtained if contrast is given to patients in this category. 3) if the patient has been on dialysis for 6 months or longer, you can assume they have no residual  renal function." Nephrology has been following this patient, and there is still residual renal function, with risk of starting dialysis in the next few days.  I called to discuss with husband, Jaquelyn Bitter, re: high suspicion for PE, inability to confirm with imaging, and risks/benefits of starting heparin. I also informed Jaquelyn Bitter that patient is on a ventilator at this time, and family is planning to come up to the hospital. Through shared decision making it was decided to go ahead and start heparin, without confirmation of a PE at this time.   CRITICAL CARE Performed by: [place]   Total critical care time: 45 minutes  Critical care time was exclusive of separately billable procedures and treating other patients.  Critical care was necessary to treat or prevent imminent or  life-threatening deterioration.  Critical care was time spent personally by me on the following activities: development of treatment plan with patient and/or surrogate as well as nursing, discussions with consultants, evaluation of patient's response to treatment, examination of patient, obtaining history from patient or surrogate, ordering and performing treatments and interventions, ordering and review of laboratory studies, ordering and review of radiographic studies, pulse oximetry and re-evaluation of patient's condition.

## 2020-10-14 NOTE — TOC Progression Note (Signed)
Transition of Care [facility]) - Progression Note    Patient Details  Name: KLOE OATES MRN: [id number] Date of Birth: [birth year]  Transition of Care [facility]) CM/SW [place], LCSW Phone Number: 10/14/2020, 1:39 PM  Clinical Narrative:    CSW spoke with patient's sister who reported that patient is now agreeable to SNF. CSW started [place] and referred patient to local SNF. TOC to follow.   Expected Discharge Plan: [place] Barriers to Discharge: Continued Medical Work up  Expected Discharge Plan and Services Expected Discharge Plan: [place] Choice: Home Health, Durable Medical Equipment Living arrangements for the past 2 months: Single Family Home                                       Social Determinants of Health (SDOH) Interventions    Readmission Risk Interventions Readmission Risk Prevention Plan 10/12/2020  Transportation Screening Complete  Home Care Screening Complete  Medication Review (RN CM) Complete  Some recent data might be hidden

## 2020-10-14 NOTE — Progress Notes (Signed)
Physical Therapy Treatment Patient Details Name: Krista Strickland MRN: [id number] DOB: 08-03-1950 Today's Date: 10/14/2020   History of Present Illness Krista Strickland is a 70 y.o. female presents with abdominal pain, sharp 9/10 in LLQ. Of note, recent renal biopsy 9/9. CT abdomen and pelvis without contrast showed acute 8 x 4 x 12 cm left renal subcapsular hematoma. PMH: CKD, anemia, diabetes, HTN, gout, stroke, hypertrophic obstructive cardiomyopathy, hyperlipidemia, uterine CA, R eye vision loss    PT Comments    Patient demonstrates good return for sitting up at bedside with labored movement, unable to stand without AD due to BLE weakness and knee pain, required use of RW to stand, but had much buckling of knees before able to stand with Mod assist.  Patient able to take a few side steps and forward/backwards before having to sit due to fatigue.  Patient tolerated sitting up in chair after therapy with her sister present in room after therapy - nursing staff notified.  Patient will benefit from continued physical therapy in hospital and recommended venue below to increase strength, balance, endurance for safe ADLs and gait.     Recommendations for follow up therapy are one component of a multi-disciplinary discharge planning process, led by the attending physician.  Recommendations may be updated based on patient status, additional functional criteria and insurance authorization.  Follow Up Recommendations  SNF     Equipment Recommendations  Rolling walker with 5" wheels    Recommendations for Other Services       Precautions / Restrictions Precautions Precautions: Fall Restrictions Weight Bearing Restrictions: No     Mobility  Bed Mobility Overal bed mobility: Needs Assistance Bed Mobility: Supine to Sit     Supine to sit: Supervision Sit to supine: Supervision   General bed mobility comments: increased time, labored movement    Transfers Overall transfer level:  Needs assistance Equipment used: Rolling walker (2 wheeled) Transfers: Sit to/from [facility] Sit to Stand: Min assist;Mod assist Stand pivot transfers: Min assist;Mod assist       General transfer comment: unable to complete sit to stand without AD due to weakness, required use of RW and Min/mod assist  Ambulation/Gait Ambulation/Gait assistance: Mod assist Gait Distance (Feet): 15 Feet Assistive device: Rolling walker (2 wheeled) Gait Pattern/deviations: Decreased step length - right;Decreased step length - left;Decreased stride length Gait velocity: decreased   General Gait Details: limited to a few side steps, stepping forward/backwards at bedside with occasional buckling of knees, limited mostly due to fatigue and bilateral knee soreness   Stairs             Wheelchair Mobility    Modified Rankin (Stroke Patients Only)       Balance Overall balance assessment: Needs assistance Sitting-balance support: Feet supported;No upper extremity supported Sitting balance-Leahy Scale: Good Sitting balance - Comments: at EOB   Standing balance support: During functional activity;No upper extremity supported Standing balance-Leahy Scale: Poor Standing balance comment: fair/poor using RW                            Cognition Arousal/Alertness: Awake/alert Behavior During Therapy: WFL for tasks assessed/performed Overall Cognitive Status: Within Functional Limits for tasks assessed                                        Exercises General Exercises -  Lower Extremity Long Arc Quad: Seated;AROM;Strengthening;Both;10 reps Hip Flexion/Marching: Seated;AROM;Strengthening;Both;10 reps Toe Raises: Seated;AROM;Strengthening;Both;10 reps Heel Raises: Seated;AROM;Strengthening;Both;10 reps    General Comments        Pertinent Vitals/Pain Pain Assessment: Faces Faces Pain Scale: Hurts little more Pain Location: bilateral  knees Pain Descriptors / Indicators: Sore;Grimacing Pain Intervention(s): Limited activity within patient's tolerance;Monitored during session;Premedicated before session;Repositioned    Home Living                      Prior Function            PT Goals (current goals can now be found in the care plan section) Acute Rehab PT Goals Patient Stated Goal: "I'm going home" PT Goal Formulation: With patient/family Time For Goal Achievement: 10/26/20 Potential to Achieve Goals: Fair Progress towards PT goals: Progressing toward goals    Frequency    Min 3X/week      PT Plan Discharge plan needs to be updated    Co-evaluation              AM-PAC PT "6 Clicks" Mobility   Outcome Measure  Help needed turning from your back to your side while in a flat bed without using bedrails?: None Help needed moving from lying on your back to sitting on the side of a flat bed without using bedrails?: None Help needed moving to and from a bed to a chair (including a wheelchair)?: A Lot Help needed standing up from a chair using your arms (e.g., wheelchair or bedside chair)?: A Lot Help needed to walk in hospital room?: A Lot Help needed climbing 3-5 steps with a railing? : Total 6 Click Score: 15    End of Session   Activity Tolerance: Patient tolerated treatment well;Patient limited by fatigue Patient left: in chair;with call bell/phone within reach;with family/visitor present Nurse Communication: Mobility status PT Visit Diagnosis: Unsteadiness on feet (R26.81);Other abnormalities of gait and mobility (R26.89);Muscle weakness (generalized) (M62.81)     Time: 4696-2952 PT Time Calculation (min) (ACUTE ONLY): 23 min  Charges:  $Therapeutic Exercise: 8-22 mins $Therapeutic Activity: 8-22 mins                     2:12 PM, 10/14/20 Lonell Grandchild, MPT Physical Therapist with [facility] 336 [phone] office [phone] mobile phone

## 2020-10-14 NOTE — Progress Notes (Signed)
PROGRESS NOTE    Patient: Krista Strickland                            PCP: Fayrene Helper, MD                    DOB: April 18, 1950            DOA: 10/10/2020 XTK:[id number]             DOS: 10/14/2020, 1:31 PM   LOS: 2 days   Date of Service: The patient was seen and examined on 10/14/2020  Subjective:   The patient was seen and examined this morning. Hypertensive otherwise hemodynamically stable this morning Overnight had an episode of chest pain, in hypertensive state, nonspecific EKG was noted with mildly elevated troponin... Was placed briefly nitroglycerin drip... Which was discontinued   Brief Narrative:   Krista Strickland is a 70 y.o. female with medical history significant for CKD stage IV, hypertension, type 2 diabetes mellitus, GERD, hyperlipidemia, HOCM who presents to the emergency department due to abdominal pain which started around 1 PM today.  Pain was sharp in nature, it was constant and was rated as 9/10 on pain scale.  Pain was located left lower quadrant.  She endorsed one loose bowel movement today and was not sure if this was related to eating a larger than usual breakfast around 11 AM this morning.  She recently had renal biopsy on 9/9.  She follows with Dr. Theador Hawthorne due to CKD and was last seen on 8/18.   ED: 171/89, mild leukocytosis, hyponatremia,, BUN to creatinine 93/4.48, hyperglycemia, albumin 3.3.  Influenza A, B, SARS coronavirus 2 was negative.   CT abdomen and pelvis without contrast showed acute 8 x 4 x 12 cm left renal subcapsular hematoma.  Interventional radiologist was consulted and recommended monitoring patient for 24 to 48 hours    Assessment & Plan:   Principal Problem:   Abdominal pain Active Problems:   Mixed hyperlipidemia   Obesity (BMI 30.0-34.9)   Essential hypertension   Anemia in chronic kidney disease   Renal hematoma, left, initial encounter   Hyponatremia   Hypoalbuminemia due to protein-calorie malnutrition (HCC)    Hyperglycemia due to diabetes mellitus ([place])   Acute kidney injury superimposed on CKD (HCC)   Leukocytosis   Chest pain  -With elevated troponin 100, 123, 122 -Likely ischemic demand due to accelerated hypertension -Currently chest pain-free -Cardiologist consulted appreciate recommendations -2D echocardiogram -Continue to monitor closely  Abdominal pain -Likely related to left renal subscapular hematoma -Improving, tolerable with narcotics -CT abdomen and pelvis without contrast showed scattered colonic diverticulosis with no acute diverticulitis..  Also revealing left renal subscapular hematoma - Continue IV NS at 75 mLs/Hr Continue IV Dilaudid 0.5 mg q.4h p.r.n. for moderate to severe pain Continue IV Zofran p.r.n. Procalcitonin will be down to rule out infectious process    leukocytosis possibly reactive vs infective WBC 15.4, >> 17.0  -Likely reactive, no source of infection -UA chest x-ray negative -  procalcitonin 0.34, afebrile, hypertensive - Withholding antibiotics at this time    Left renal subscapular hematoma -Stable, following H&H, -No intervention recommended at this time  CT abdomen and pelvis without contrast showed acute 8 x 4 x 12 cm left renal subcapsular hematoma.  Discussed with nephrologist Dr. Othelia Pulling and discussed the case with interventional radiologist Dr. Hassell-recommended close monitoring, no need for transfer to [facility] for  possible embolization at this time. -Monitoring H&H-stable -Continue to BladderScan, mild retention, negative for hematuria or obstruction or clot -We will monitor for urinary retention, hematuria   Acute On CKD stage V/dehydration BUN to creatinine 93/4.48 (baseline creatinine at 2.7 x 3.7) >> 94/4.32,>>> 5.25 Continue IV hydration, bicarb PO was added  Renally adjust medications, avoid nephrotoxic agents/dehydration/hypotension  -Nephrology following -  patient progressing to renal failure, likely will need  hemodialysis in near future  Hyponatremia (chronic) This may be due to diuretic use..  Serum sodium 127, 126, 127, 126  -Continue gentle IV fluid hydration, bicarb, Continue to monitor sodium with serial BMPs Urine and serum osmolality and urine sodium will be checked  Hyperglycemia secondary to T2DM Continue ISS and hypoglycemic protocol -Monitoring CBG closely  Anemia of chronic disease Hemoglobin at 11.8 (baseline at 12.2-13.4) >>10.1, 9.5>> 8.5  This is possibly secondary to patient's history of CKD Monitoring H&H every 8 hours  Essential hypertension (uncontrolled) -accelerated hypertension Continue Lopressor, diltiazem, Lasix, hydralazine, Currently recommended continue hydralazine, Cardizem, switching labetalol back to blood pressure increased dose 200 twice daily, hydralazine increased to 75 twice daily -Stable, as needed IV hydralazine  Hyperlipidemia Continue Zetia and Pravachol -Stable  Hypoalbuminemia secondary to mild protein calorie malnutrition Protein supplement will be provided -IV albumin initiated per nephrology  Obesity (BMI; 31.7) Patient counseled on diet lifestyle modification     Consultants:  IR/ Nephrologist    --------------------------------------------------------------------------------------------------------------------------------  DVT prophylaxis:  SCD Code Status:   Code Status: Full Code  Family Communication: Sister at bedside  The above findings and plan of care has been discussed with patient (and family)  in detail,  they expressed understanding and agreement of above. -Advance care planning has been discussed.   Admission status:   Status is: Observation  The patient remains OBS appropriate and will d/c before 2 midnights.  Dispo: The patient is from: Home              Anticipated d/c is to: Home              Patient currently is not medically stable to d/c.   Difficult to place patient No   Level of care:  Telemetry   Procedures:   No admission procedures for hospital encounter.    Antimicrobials:  Anti-infectives (From admission, onward)    None        Medication:   calcitRIOL  0.25 mcg Oral Daily   Chlorhexidine Gluconate Cloth  6 each Topical Daily   diltiazem  180 mg Oral Daily   ezetimibe  10 mg Oral Daily   febuxostat  40 mg Oral Daily   feeding supplement (GLUCERNA SHAKE)  237 mL Oral TID BM   furosemide  80 mg Intravenous Q12H   gabapentin  200 mg Oral BID   hydrALAZINE  50 mg Oral Q8H   influenza vaccine adjuvanted  0.5 mL Intramuscular Tomorrow-1000   insulin aspart  0-5 Units Subcutaneous QHS   insulin aspart  0-6 Units Subcutaneous TID WC   insulin aspart protamine- aspart  50 Units Subcutaneous Q breakfast   metoprolol tartrate  75 mg Oral BID   pravastatin  20 mg Oral QAC breakfast   sodium bicarbonate  650 mg Oral TID    hydrALAZINE, HYDROcodone-acetaminophen, HYDROmorphone (DILAUDID) injection, ondansetron (ZOFRAN) IV   Objective:   Vitals:   10/14/20 0551 10/14/20 0700 10/14/20 0800 10/14/20 1100  BP: (!) 159/48 (!) 171/61 (!) 172/59   Pulse:  81 83   Resp:  (!)  22 20   Temp:   98.3 F (36.8 C) 98.5 F (36.9 C)  TempSrc:   Axillary Oral  SpO2:  100% 99%   Weight:      Height:        Intake/Output Summary (Last 24 hours) at 10/14/2020 1331 Last data filed at 10/14/2020 1013 Gross per 24 hour  Intake --  Output 1025 ml  Net -1025 ml   Filed Weights   10/12/20 1659  Weight: 80.7 kg     Examination:      Physical Exam:   General:  Alert, oriented, cooperative, no distress;   HEENT:  Normocephalic, PERRL, otherwise with in Normal limits   Neuro:  CNII-XII intact. , normal motor and sensation, reflexes intact   Lungs:   Clear to auscultation BL, Respirations unlabored, no wheezes / crackles  Cardio:    S1/S2, RRR, No murmure, No Rubs or Gallops   Abdomen:   Soft, non-tender, bowel sounds active all four quadrants,  no guarding or  peritoneal signs.  Muscular skeletal:  Global generalized weaknesses, Limited exam - in bed, able to move all 4 extremities, flank pain with palpation 2+ pulses,  symmetric, No pitting edema  Skin:  Dry, warm to touch, negative for any Rashes,  Wounds: Please see nursing documentation             -----------------------------------------------------------------------------------------------------------------------------    LABs:  CBC Latest Ref Rng & Units 10/14/2020 10/13/2020 10/13/2020  WBC 4.0 - 10.5 K/uL 13.4(H) 17.6(H) 17.0(H)  Hemoglobin 12.0 - 15.0 g/dL 8.5(L) 9.5(L) 9.5(L)  Hematocrit 36.0 - 46.0 % 25.9(L) 28.9(L) 28.6(L)  Platelets 150 - 400 K/uL 199 220 214   CMP Latest Ref Rng & Units 10/14/2020 10/13/2020 10/13/2020  Glucose 70 - 99 mg/dL 175(H) 161(H) 190(H)  BUN 8 - 23 mg/dL 85(H) 97(H) 94(H)  Creatinine 0.44 - 1.00 mg/dL 5.25(H) 4.66(H) 4.32(H)  Sodium 135 - 145 mmol/L 126(L) 124(L) 123(L)  Potassium 3.5 - 5.1 mmol/L 4.9 5.0 4.9  Chloride 98 - 111 mmol/L 94(L) 94(L) 95(L)  CO2 22 - 32 mmol/L 19(L) 20(L) 20(L)  Calcium 8.9 - 10.3 mg/dL 8.4(L) 8.7(L) 8.3(L)  Total Protein 6.5 - 8.1 g/dL - - -  Total Bilirubin 0.3 - 1.2 mg/dL - - -  Alkaline Phos 38 - 126 U/L - - -  AST 15 - 41 U/L - - -  ALT 0 - 44 U/L - - -       Micro Results Recent Results (from the past 240 hour(s))  Resp Panel by RT-PCR (Flu A&B, Covid) Nasopharyngeal Swab     Status: None   Collection Time: 10/10/2020  9:07 PM   Specimen: Nasopharyngeal Swab; Nasopharyngeal(NP) swabs in vial transport medium  Result Value Ref Range Status   SARS Coronavirus 2 by RT PCR NEGATIVE NEGATIVE Final    Comment: (NOTE) SARS-CoV-2 target nucleic acids are NOT DETECTED.  The SARS-CoV-2 RNA is generally detectable in upper respiratory specimens during the acute phase of infection. The lowest concentration of SARS-CoV-2 viral copies this assay can detect is 138 copies/mL. A negative result does not preclude  SARS-Cov-2 infection and should not be used as the sole basis for treatment or other patient management decisions. A negative result may occur with  improper specimen collection/handling, submission of specimen other than nasopharyngeal swab, presence of viral mutation(s) within the areas targeted by this assay, and inadequate number of viral copies(<138 copies/mL). A negative result must be combined with clinical observations, patient history, and epidemiological information.  The expected result is Negative.  Fact Sheet for Patients:  EntrepreneurPulse.com.au  Fact Sheet for Healthcare Providers:  IncredibleEmployment.be  This test is no t yet approved or cleared by the [place] FDA and  has been authorized for detection and/or diagnosis of SARS-CoV-2 by FDA under an Emergency Use Authorization (EUA). This EUA will remain  in effect (meaning this test can be used) for the duration of the COVID-19 declaration under Section 564(b)(1) of the Act, 21 U.S.C.section 360bbb-3(b)(1), unless the authorization is terminated  or revoked sooner.       Influenza A by PCR NEGATIVE NEGATIVE Final   Influenza B by PCR NEGATIVE NEGATIVE Final    Comment: (NOTE) The Xpert Xpress SARS-CoV-2/FLU/RSV plus assay is intended as an aid in the diagnosis of influenza from Nasopharyngeal swab specimens and should not be used as a sole basis for treatment. Nasal washings and aspirates are unacceptable for Xpert Xpress SARS-CoV-2/FLU/RSV testing.  Fact Sheet for Patients: EntrepreneurPulse.com.au  Fact Sheet for Healthcare Providers: IncredibleEmployment.be  This test is not yet approved or cleared by the [place] FDA and has been authorized for detection and/or diagnosis of SARS-CoV-2 by FDA under an Emergency Use Authorization (EUA). This EUA will remain in effect (meaning this test can be used) for the duration of  the COVID-19 declaration under Section 564(b)(1) of the Act, 21 U.S.C. section 360bbb-3(b)(1), unless the authorization is terminated or revoked.  Performed at [facility], [address], [place], [place] [address]   MRSA Next Gen by PCR, Nasal     Status: None   Collection Time: 10/12/20  5:00 PM   Specimen: Nasal Mucosa; Nasal Swab  Result Value Ref Range Status   MRSA by PCR Next Gen NOT DETECTED NOT DETECTED Final    Comment: (NOTE) The GeneXpert MRSA Assay (FDA approved for NASAL specimens only), is one component of a comprehensive MRSA colonization surveillance program. It is not intended to diagnose MRSA infection nor to guide or monitor treatment for MRSA infections. Test performance is not FDA approved in patients less than 62 years old. Performed at [facility], [address] [place], [place] [address]     Radiology Reports CT Abdomen Pelvis Wo Contrast  Result Date: 10/14/2020 CLINICAL DATA:  abdominal pain that began today Pt had renal biopsy 3 days ago EXAM: CT ABDOMEN AND PELVIS WITHOUT CONTRAST TECHNIQUE: Multidetector CT imaging of the abdomen and pelvis was performed following the standard protocol without IV contrast. COMPARISON:  CT lumbar spine 09/23/2019, CT abdomen pelvis 07/22/2019 FINDINGS: Lower chest: Redemonstration of calcified right lower lobe pulmonary nodules (4:14, 8). Similar finding on the left in the lower lobe (4:4). Hepatobiliary: No focal liver abnormality. No gallstones, gallbladder wall thickening, or pericholecystic fluid. No biliary dilatation. Pancreas: No focal lesion. Normal pancreatic contour. No surrounding inflammatory changes. No main pancreatic ductal dilatation. Spleen: Normal in size without focal abnormality. Adrenals/Urinary Tract: No adrenal nodule bilaterally. Left perinephric fat stranding. There is a hyperdense 8 x 4 x 12cm subcapsular hematoma along the left kidney leading to mass effect on the renal parenchyma. No nephrolithiasis  and no hydronephrosis. No definite contour-deforming renal mass. No ureterolithiasis or hydroureter. The urinary bladder is unremarkable. Stomach/Bowel: PO contrast reaches the transverse colon. Stomach is within normal limits. No evidence of bowel wall thickening or dilatation. Diffuse sigmoid diverticulosis. Appendix appears normal. Vascular/Lymphatic: No abdominal aorta or iliac aneurysm. Severe atherosclerotic plaque of the aorta and its branches. No abdominal, pelvic, or inguinal lymphadenopathy. Reproductive: There is a  2.8 cm right adnexal cystic lesion. The uterus is not well visualized and likely surgically removed. The left adnexal region is unremarkable. Other: Hyperdense free fluid noted coursing into the pelvis no intraperitoneal free fluid. No intraperitoneal free gas. No organized fluid collection. Musculoskeletal: Small fat containing umbilical hernia. No suspicious lytic or blastic osseous lesions. No acute displaced fracture. Multilevel degenerative changes of the spine. Grade 1 anterolisthesis of L4 on L5. IMPRESSION: 1. Acute 8 x 4 x12 cm left renal subcapsular hematoma. 2. Scattered colonic diverticulosis with no acute diverticulitis. 3. Stable bilateral lower lobe calcified pulmonary nodules. 4. A 2.8 cm right adnexal cystic lesion. No follow-up imaging recommended. Note: This recommendation does not apply to premenarchal patients and to those with increased risk (genetic, family history, elevated tumor markers or other high-risk factors) of ovarian cancer. Reference: JACR 2020 Feb; 17(2):248-254. 5.  Aortic Atherosclerosis (ICD10-I70.0). These results were called by telephone at the time of interpretation on 10/17/2020 at 8:42 pm to provider [facility] , who verbally acknowledged these results. Electronically Signed   By: Iven Finn M.D.   On: 10/09/2020 20:53   US RENAL  Result Date: 10/12/2020 CLINICAL DATA:  AK I on CKD, history of left kidney biopsy 10/07/2020 EXAM: RENAL /  URINARY TRACT ULTRASOUND COMPLETE COMPARISON:  CT abdomen/pelvis 1 day prior FINDINGS: Right Kidney: Renal measurements: 10.4 cm x 5.4 cm x 5.3 cm = volume: 155 mL. Echogenicity within normal limits. No mass or hydronephrosis visualized. Left Kidney: Renal measurements: 12.8 cm x 6.2 cm x 6.0 cm = volume: 249 mL. There is a subcapsular hematoma along the left kidney measuring approximately 10.6 cm x 2.8 cm x 6.5 cm, as seen on the CT obtained 1 day prior. Bladder: Appears normal for degree of bladder distention. Other: None. IMPRESSION: Subcapsular hematoma along the left kidney, grossly similar in size allowing for difference in modality. Electronically Signed   By: Valetta Mole M.D.   On: 10/12/2020 11:15   DG CHEST PORT 1 VIEW  Result Date: 10/12/2020 CLINICAL DATA:  Chest pain. EXAM: PORTABLE CHEST 1 VIEW COMPARISON:  Chest x-ray 01/06/2004.  Chest x-ray 09/23/2019. FINDINGS: The heart is enlarged, unchanged. There is central pulmonary vascular congestion. There is a new small left pleural effusion with patchy opacities in the left lung base. There is no evidence for pneumothorax. No acute fractures are identified. IMPRESSION: 1.  Cardiomegaly with central pulmonary vascular congestion. 2. New small left pleural effusion with left basilar atelectasis/airspace disease. Electronically Signed   By: Ronney Asters M.D.   On: 10/12/2020 23:56   ECHOCARDIOGRAM COMPLETE  Result Date: 10/14/2020    ECHOCARDIOGRAM REPORT   Patient Name:   Krista Strickland Date of Exam: 10/13/2020 Medical Rec #:  [id number]        Height:       62.0 in Accession #:    [id number]       Weight:       177.9 lb Date of Birth:  02-19-1950        BSA:          1.819 m Patient Age:    55 years         BP:           186/53 mmHg Patient Gender: F                HR:           68 bpm. Exam Location:  [facility] Procedure: 2D Echo, Cardiac  Doppler and Color Doppler Indications:    Hypertrophic cardiomyopathy  History:        Patient has prior  history of Echocardiogram examinations, most                 recent 09/18/2019. Stroke; Risk Factors:Hypertension, Diabetes                 and Dyslipidemia. Hypertrophic cardiomyopathy.  Sonographer:    Wenda Low Referring Phys: Erma Heritage IMPRESSIONS  1. Left ventricular ejection fraction, by estimation, is 70 to 75%. The left ventricle has hyperdynamic function. The left ventricle has no regional wall motion abnormalities. There is severe concentric left ventricular hypertrophy. Left ventricular diastolic parameters are indeterminate. Elevated left ventricular end-diastolic pressure. Resting LVOT gradient of 36 mmHg.  2. Right ventricular systolic function is normal. The right ventricular size is normal. Tricuspid regurgitation signal is inadequate for assessing PA pressure.  3. Left atrial size was moderately dilated.  4. Left pleural effusion is present.  5. The mitral valve is abnormal. Mild to moderate mitral valve regurgitation. Severe mitral annular calcification.  6. The aortic valve is tricuspid. There is moderate calcification of the aortic valve. Aortic valve regurgitation is not visualized. Mild to moderate aortic valve sclerosis/calcification is present, without any evidence of aortic stenosis. Aortic valve mean gradient measures 7.0 mmHg.  7. The inferior vena cava is normal in size with greater than 50% respiratory variability, suggesting right atrial pressure of 3 mmHg. Comparison(s): No significant change from prior study. FINDINGS  Left Ventricle: Left ventricular ejection fraction, by estimation, is 70 to 75%. The left ventricle has hyperdynamic function. The left ventricle has no regional wall motion abnormalities. The left ventricular internal cavity size was normal in size. There is severe concentric left ventricular hypertrophy. Left ventricular diastolic parameters are indeterminate. Elevated left ventricular end-diastolic pressure. Right Ventricle: The right ventricular size  is normal. No increase in right ventricular wall thickness. Right ventricular systolic function is normal. Tricuspid regurgitation signal is inadequate for assessing PA pressure. Left Atrium: Left atrial size was moderately dilated. Right Atrium: Right atrial size was normal in size. Pericardium: Left pleural effusion is present. There is no evidence of pericardial effusion. Presence of pericardial fat pad. Mitral Valve: The mitral valve is abnormal. Severe mitral annular calcification. Mild to moderate mitral valve regurgitation. MV peak gradient, 26.3 mmHg. The mean mitral valve gradient is 13.0 mmHg. Tricuspid Valve: The tricuspid valve is grossly normal. Tricuspid valve regurgitation is trivial. Aortic Valve: The aortic valve is tricuspid. There is moderate calcification of the aortic valve. Aortic valve regurgitation is not visualized. Mild to moderate aortic valve sclerosis/calcification is present, without any evidence of aortic stenosis. Aortic valve mean gradient measures 7.0 mmHg. Aortic valve peak gradient measures 13.7 mmHg. Aortic valve area, by VTI measures 3.27 cm. Pulmonic Valve: The pulmonic valve was grossly normal. Pulmonic valve regurgitation is trivial. Aorta: The aortic root is normal in size and structure. Venous: The inferior vena cava is normal in size with greater than 50% respiratory variability, suggesting right atrial pressure of 3 mmHg. IAS/Shunts: No atrial level shunt detected by color flow Doppler. Additional Comments: There is pleural effusion in the left lateral region.  LEFT VENTRICLE PLAX 2D LVIDd:         4.20 cm  Diastology LVIDs:         2.80 cm  LV e' medial:    5.00 cm/s LV PW:         1.70 cm  LV E/e' medial:  42.6 LV IVS:        1.70 cm  LV e' lateral:   6.53 cm/s LVOT diam:     1.90 cm  LV E/e' lateral: 32.6 LV SV:         132 LV SV Index:   73 LVOT Area:     2.84 cm  RIGHT VENTRICLE RV Basal diam:  3.35 cm RV Mid diam:    3.00 cm RV S prime:     10.60 cm/s TAPSE  (M-mode): 2.6 cm LEFT ATRIUM             Index       RIGHT ATRIUM           Index LA diam:        4.70 cm 2.58 cm/m  RA Area:     15.50 cm LA Vol (A2C):   97.2 ml 53.44 ml/m RA Volume:   42.50 ml  23.37 ml/m LA Vol (A4C):   81.6 ml 44.86 ml/m LA Biplane Vol: 90.2 ml 49.59 ml/m  AORTIC VALVE AV Area (Vmax):    3.40 cm AV Area (Vmean):   3.49 cm AV Area (VTI):     3.27 cm AV Vmax:           185.00 cm/s AV Vmean:          122.500 cm/s AV VTI:            0.405 m AV Peak Grad:      13.7 mmHg AV Mean Grad:      7.0 mmHg LVOT Vmax:         222.00 cm/s LVOT Vmean:        151.000 cm/s LVOT VTI:          0.467 m LVOT/AV VTI ratio: 1.15  AORTA Ao Root diam: 2.40 cm MITRAL VALVE MV Area (PHT): 3.02 cm     SHUNTS MV Area VTI:   1.99 cm     Systemic VTI:  0.47 m MV Peak grad:  26.3 mmHg    Systemic Diam: 1.90 cm MV Mean grad:  13.0 mmHg MV Vmax:       2.56 m/s MV Vmean:      168.0 cm/s MV Decel Time: 251 msec MV E velocity: 213.00 cm/s MV A velocity: 223.00 cm/s MV E/A ratio:  0.96 Rozann Lesches MD Electronically signed by Rozann Lesches MD Signature Date/Time: 10/14/2020/1:02:22 PM    Final    US BIOPSY (KIDNEY)  Result Date: 10/07/2020 INDICATION: 70 year old female with a history of proteinuria referred for medical renal biopsy EXAM: IMAGE GUIDED MEDICAL RENAL BIOPSY MEDICATIONS: None. ANESTHESIA/SEDATION: Moderate (conscious) sedation was employed during this procedure. A total of Versed 1.0 mg and Fentanyl 50 mcg was administered intravenously. Moderate Sedation Time: 10 minutes. The patient's level of consciousness and vital signs were monitored continuously by radiology nursing throughout the procedure under my direct supervision. FLUOROSCOPY TIME:  Ultrasound COMPLICATIONS: None PROCEDURE: Informed written consent was obtained from the patient after a thorough discussion of the procedural risks, benefits and alternatives. All questions were addressed. Maximal Sterile Barrier Technique was utilized  including caps, mask, sterile gowns, sterile gloves, sterile drape, hand hygiene and skin antiseptic. A timeout was performed prior to the initiation of the procedure. Patient was positioned prone position on the gantry table. Images were stored sent to PACs. Once the patient is prepped and draped in the usual sterile fashion, the skin and subcutaneous tissues overlying the left kidney were  generously infiltrated 1% lidocaine for local anesthesia. Using ultrasound guidance, a 15 gauge guide needle was advanced into the lower cortex of the left kidney. Once we confirmed location of the needle tip, 2 separate 16 gauge core biopsy were achieved. Two Gel-Foam pledgets were infused with a small amount of saline. The needle was removed. Final images were stored. The patient tolerated the procedure well and remained hemodynamically stable throughout. No complications were encountered and no significant blood loss encountered. IMPRESSION: Status post ultrasound-guided medical renal biopsy. Signed, Dulcy Fanny. Dellia Nims, RPVI [facility] Electronically Signed   By: Corrie Mckusick D.O.   On: 10/07/2020 10:11    SIGNED: Deatra James, MD, FHM. [facility],  Pager (please use [email or web address] to page/text) Please use Epic Secure Chat for non-urgent communication (7AM-7PM)  If 7PM-7AM, please contact night-coverage [email or web address], 10/14/2020, 1:31 PM

## 2020-10-14 NOTE — Progress Notes (Addendum)
Progress Note  Patient Name: Krista Strickland Date of Encounter: 10/14/2020  [facility] Cardiologist: Rozann Lesches, MD   Subjective   No recurrent chest pain overnight or this morning. Denies any abdominal pain. Feels her breathing is close to baseline. Says she only urinated 1-2 times yesterday despite receiving multiple doses of Lasix.   Inpatient Medications    Scheduled Meds:  calcitRIOL  0.25 mcg Oral Daily   Chlorhexidine Gluconate Cloth  6 each Topical Daily   diltiazem  180 mg Oral Daily   ezetimibe  10 mg Oral Daily   febuxostat  40 mg Oral Daily   feeding supplement (GLUCERNA SHAKE)  237 mL Oral TID BM   furosemide  40 mg Intravenous Q12H   gabapentin  200 mg Oral BID   hydrALAZINE  50 mg Oral Q8H   influenza vaccine adjuvanted  0.5 mL Intramuscular Tomorrow-1000   insulin aspart  0-5 Units Subcutaneous QHS   insulin aspart  0-6 Units Subcutaneous TID WC   insulin aspart protamine- aspart  50 Units Subcutaneous Q breakfast   labetalol  200 mg Oral BID   pravastatin  20 mg Oral QAC breakfast   sodium chloride  1 g Oral TID WC    PRN Meds: hydrALAZINE, HYDROcodone-acetaminophen, HYDROmorphone (DILAUDID) injection, ondansetron (ZOFRAN) IV   Vital Signs    Vitals:   10/14/20 0300 10/14/20 0400 10/14/20 0500 10/14/20 0551  BP: (!) 152/53 (!) 169/61 (!) 159/48 (!) 159/48  Pulse: 75 78 79   Resp: (!) 21 17 (!) 22   Temp:      TempSrc:      SpO2: 99% 100% 98%   Weight:      Height:        Intake/Output Summary (Last 24 hours) at 10/14/2020 0806 Last data filed at 10/14/2020 0500 Gross per 24 hour  Intake --  Output 650 ml  Net -650 ml   Last 3 Weights 10/12/2020 09/29/2020 09/13/2020  Weight (lbs) 177 lb 14.6 oz 179 lb 3.2 oz 180 lb 3.2 oz  Weight (kg) 80.7 kg 81.285 kg 81.738 kg      Telemetry    NSR, HR in 70's with occasional PVC's.  - Personally Reviewed  ECG    NSR with 1st degree AV Block, HR 83. No acute ST changes.  - Personally  Reviewed  Physical Exam   GEN: Pleasant obese female appearing in no acute distress.   Neck: No JVD Cardiac: RRR, 2/6 systolic murmur throughout.  Respiratory: Clear to auscultation bilaterally. GI: Soft, nontender, non-distended  MS: Chronic appearing pitting edema; No deformity. Neuro:  Nonfocal  Psych: Normal affect   Labs    High Sensitivity Troponin:   Recent Labs  Lab 10/12/20 2359 10/13/20 0205 10/13/20 0456  TROPONINIHS 100* 123* 122*     Chemistry Recent Labs  Lab 10/27/2020 1738 10/12/20 0422 10/13/20 0205 10/13/20 0814 10/14/20 0505  NA 127* 126* 123* 124* 126*  K 4.8 4.5 4.9 5.0 4.9  CL 94* 91* 95* 94* 94*  CO2 21* 23 20* 20* 19*  GLUCOSE 139* 54* 190* 161* 175*  BUN 93* 94* 94* 97* 85*  CREATININE 4.48* 4.46* 4.32* 4.66* 5.25*  CALCIUM 9.2 9.1 8.3* 8.7* 8.4*  MG  --  1.8  --   --   --   PROT 7.0 6.4*  --   --   --   ALBUMIN 3.3* 3.1*  --  2.8*  --   AST 39 24  --   --   --  ALT 43 34  --   --   --   ALKPHOS 82 76  --   --   --   BILITOT 0.4 0.5  --   --   --   GFRNONAA 10* 10* 10* 10* 8*  ANIONGAP 12 12 8 10 13      Hematology Recent Labs  Lab 10/13/20 0205 10/13/20 0814 10/14/20 0505  WBC 17.0* 17.6* 13.4*  RBC 3.25* 3.28* 2.89*  HGB 9.5* 9.5* 8.5*  HCT 28.6* 28.9* 25.9*  MCV 88.0 88.1 89.6  MCH 29.2 29.0 29.4  MCHC 33.2 32.9 32.8  RDW 13.6 13.8 14.1  PLT 214 220 199    Radiology    US RENAL  Result Date: 10/12/2020 CLINICAL DATA:  AK I on CKD, history of left kidney biopsy 10/07/2020 EXAM: RENAL / URINARY TRACT ULTRASOUND COMPLETE COMPARISON:  CT abdomen/pelvis 1 day prior FINDINGS: Right Kidney: Renal measurements: 10.4 cm x 5.4 cm x 5.3 cm = volume: 155 mL. Echogenicity within normal limits. No mass or hydronephrosis visualized. Left Kidney: Renal measurements: 12.8 cm x 6.2 cm x 6.0 cm = volume: 249 mL. There is a subcapsular hematoma along the left kidney measuring approximately 10.6 cm x 2.8 cm x 6.5 cm, as seen on the CT  obtained 1 day prior. Bladder: Appears normal for degree of bladder distention. Other: None. IMPRESSION: Subcapsular hematoma along the left kidney, grossly similar in size allowing for difference in modality. Electronically Signed   By: Valetta Mole M.D.   On: 10/12/2020 11:15   DG CHEST PORT 1 VIEW  Result Date: 10/12/2020 CLINICAL DATA:  Chest pain. EXAM: PORTABLE CHEST 1 VIEW COMPARISON:  Chest x-ray 01/06/2004.  Chest x-ray 09/23/2019. FINDINGS: The heart is enlarged, unchanged. There is central pulmonary vascular congestion. There is a new small left pleural effusion with patchy opacities in the left lung base. There is no evidence for pneumothorax. No acute fractures are identified. IMPRESSION: 1.  Cardiomegaly with central pulmonary vascular congestion. 2. New small left pleural effusion with left basilar atelectasis/airspace disease. Electronically Signed   By: Ronney Asters M.D.   On: 10/12/2020 23:56    Cardiac Studies   Echocardiogram: 09/18/2019 IMPRESSIONS    1. There is mild SAM of the anterior mitral valve leaflet in the setting  of severe symmertic LVH and hyperdynamic LV function. Dynamic LVOT  gradient of 38 mmHg. Marland Kitchen Left ventricular ejection fraction, by estimation,  is 70 to 75%. The left ventricle has  hyperdynamic function. The left ventricle has no regional wall motion  abnormalities. There is severe left ventricular hypertrophy. Left  ventricular diastolic parameters are consistent with Grade I diastolic  dysfunction (impaired relaxation). Elevated  left atrial pressure.   2. Right ventricular systolic function is normal. The right ventricular  size is normal.   3. Left atrial size was severely dilated.   4. The mitral valve is normal in structure. Trivial mitral valve  regurgitation. Mild mitral stenosis.   5. The aortic valve is tricuspid. Aortic valve regurgitation is not  visualized. No aortic stenosis is present.   6. The inferior vena cava is normal in size  with greater than 50%  respiratory variability, suggesting right atrial pressure of 3 mmHg.   Patient Profile     70 y.o. female  with PMH of hypertrophic obstructive cardiomyopathy, HTN, HLD, Type 2 DM and Stage 4 CKD who is currently admitted for a left renal subscapular hematoma following a renal biopsy. Cardiology consulted due to episode of  chest pain which occurred during admission.   Assessment & Plan    1. Chest Pain/Elevated Troponin Values - Developed chest pain during the evening hours of 9/14 which occurred in the setting of significantly elevated BP. Hs Troponin values were flat and peaked at 123. Echocardiogram was performed yesterday with the official results pending. At this time, no plan for further cardiac testing. Would not be a candidate for invasive evaluation given her anemia and advanced CKD. Continue with risk factor modification. Remains on Pravastatin, Zetia and Lopressor.    2. HOCM - She has known hypertrophic obstructive cardiomyopathy and was on Cardizem CD 180 mg daily and Lopressor 50 mg twice daily prior to admission. We titrated her Lopressor yesterday but unfortunately this was discontinued before she never received the higher dose and was transitioned to Labetalol. Ideally, she should be Lopressor and will transition back to Lopressor 75mg  BID. Pending HR and BP response, could further titrate to 100mg  BID tomorrow. A repeat echocardiogram was performed yesterday but has not yet been read. Will await the formal read and compare to prior studies.   3. HTN - BP has improved but still remains elevated, at 135/51 - 192/63 within the past 24 hours. Continue Hydralazine and Cardizem and will switch back from Labetalol to Lopressor as outlined above. Lopressor could be further titrated to 100mg  BID if needed or Hydralazine could be further increased to 75mg  BID pending BP response.    4. Acute on Chronic Stage 4 CKD - Her creatinine was at 2.92 approximately 2 months  ago and is elevated to 4.48 on admission. Trending up to 5.25 today but she did receive IV Lasix 40mg  x3 yesterday with minimal recorded output of -650 mL. Weights not recorded but will ask for daily weights. Says she only urinated a few times. Previously had issues with urinary retention. Will order a bladder scan for this AM.    5. Left renal subscapular hematoma/Anemia - Underwent renal biopsy on 10/07/2020. IR following with no plans for intervention at this time. Hgb continues to trend down as this was at 13.4 on 9/9, 11.8 on 9/13 and now at 8.5. Suspect she would benefit from repeat imaging to make sure this is not enlarging given her further decline in Hgb.    For questions or updates, please contact [place] Please consult [email or web address] for contact info under     Signed, Erma Heritage, PA-C  10/14/2020, 8:06 AM     Attending note:  Patient seen and examined.  I discussed the case with Ms. Ahmed Prima PA-C and agree with her above assessment.  Ms. Stribling does not report any recurrent chest pain overnight.  She is afebrile, heart rate is in the 70s to 80s in sinus rhythm by telemetry, rare PVCs noted.  Systolic blood pressure running 140s to 170s.  Lungs are clear.  Cardiac exam reveals RRR with 2/6 systolic murmur.  Pertinent lab work includes sodium 126, BUN 85, creatinine 5.25 up from 4.66, hemoglobin 8.5 down from 9.5.  She received IV Lasix yesterday per Nephrology, net output more than intake of 650 cc last 24 hours.  From a cardiac perspective will transition back to Lopressor from labetalol, try 75 mg twice daily and uptitrate to 100 mg twice daily if needed.  Otherwise continue Cardizem CD and hydralazine.  Patient states urine output has decreased, will get bladder scan this morning.  Nephrology also to follow-up.  With increase in creatinine and decrease in hemoglobin, may also need follow-up abdominal  imaging.  Satira Sark, M.D., F.A.C.C.

## 2020-10-15 ENCOUNTER — Inpatient Hospital Stay (HOSPITAL_COMMUNITY): Payer: HMO

## 2020-10-15 ENCOUNTER — Encounter (HOSPITAL_COMMUNITY): Payer: HMO

## 2020-10-15 DIAGNOSIS — N189 Chronic kidney disease, unspecified: Secondary | ICD-10-CM | POA: Diagnosis not present

## 2020-10-15 DIAGNOSIS — N179 Acute kidney failure, unspecified: Secondary | ICD-10-CM

## 2020-10-15 DIAGNOSIS — N185 Chronic kidney disease, stage 5: Secondary | ICD-10-CM | POA: Diagnosis not present

## 2020-10-15 DIAGNOSIS — I469 Cardiac arrest, cause unspecified: Secondary | ICD-10-CM

## 2020-10-15 DIAGNOSIS — R1030 Lower abdominal pain, unspecified: Secondary | ICD-10-CM | POA: Diagnosis not present

## 2020-10-15 DIAGNOSIS — S37012A Minor contusion of left kidney, initial encounter: Secondary | ICD-10-CM

## 2020-10-15 LAB — COMPREHENSIVE METABOLIC PANEL
ALT: 101 U/L — ABNORMAL HIGH (ref 0–44)
ALT: 121 U/L — ABNORMAL HIGH (ref 0–44)
AST: 133 U/L — ABNORMAL HIGH (ref 15–41)
AST: 183 U/L — ABNORMAL HIGH (ref 15–41)
Albumin: 3.2 g/dL — ABNORMAL LOW (ref 3.5–5.0)
Albumin: 3.3 g/dL — ABNORMAL LOW (ref 3.5–5.0)
Alkaline Phosphatase: 83 U/L (ref 38–126)
Alkaline Phosphatase: 97 U/L (ref 38–126)
Anion gap: 16 — ABNORMAL HIGH (ref 5–15)
Anion gap: 13 (ref 5–15)
BUN: 102 mg/dL — ABNORMAL HIGH (ref 8–23)
BUN: 101 mg/dL — ABNORMAL HIGH (ref 8–23)
CO2: 18 mmol/L — ABNORMAL LOW (ref 22–32)
CO2: 23 mmol/L (ref 22–32)
Calcium: 7.8 mg/dL — ABNORMAL LOW (ref 8.9–10.3)
Calcium: 8 mg/dL — ABNORMAL LOW (ref 8.9–10.3)
Chloride: 90 mmol/L — ABNORMAL LOW (ref 98–111)
Chloride: 90 mmol/L — ABNORMAL LOW (ref 98–111)
Creatinine, Ser: 5.32 mg/dL — ABNORMAL HIGH (ref 0.44–1.00)
Creatinine, Ser: 5.56 mg/dL — ABNORMAL HIGH (ref 0.44–1.00)
GFR, Estimated: 8 mL/min — ABNORMAL LOW (ref >60)
GFR, Estimated: 8 mL/min — ABNORMAL LOW (ref >60)
Glucose, Bld: 265 mg/dL — ABNORMAL HIGH (ref 70–99)
Glucose, Bld: 172 mg/dL — ABNORMAL HIGH (ref 70–99)
Potassium: 5.5 mmol/L — ABNORMAL HIGH (ref 3.5–5.1)
Potassium: 5.1 mmol/L (ref 3.5–5.1)
Sodium: 124 mmol/L — ABNORMAL LOW (ref 135–145)
Sodium: 126 mmol/L — ABNORMAL LOW (ref 135–145)
Total Bilirubin: 0.7 mg/dL (ref 0.3–1.2)
Total Bilirubin: 1.2 mg/dL (ref 0.3–1.2)
Total Protein: 6.3 g/dL — ABNORMAL LOW (ref 6.5–8.1)
Total Protein: 6.5 g/dL (ref 6.5–8.1)

## 2020-10-15 LAB — URINALYSIS, COMPLETE (UACMP) WITH MICROSCOPIC
Bilirubin Urine: NEGATIVE
Glucose, UA: 50 mg/dL — AB
Ketones, ur: NEGATIVE mg/dL
Leukocytes,Ua: NEGATIVE
Nitrite: NEGATIVE
Protein, ur: >=300 mg/dL — AB
RBC / HPF: >50 RBC/hpf — ABNORMAL HIGH (ref 0–5)
Specific Gravity, Urine: 1.014 (ref 1.005–1.030)
WBC, UA: >50 WBC/hpf — ABNORMAL HIGH (ref 0–5)
pH: 6 (ref 5.0–8.0)

## 2020-10-15 LAB — ECHOCARDIOGRAM LIMITED
Height: 64 in
S' Lateral: 2.2 cm
Weight: 3022.95 oz

## 2020-10-15 LAB — GLUCOSE, CAPILLARY
Glucose-Capillary: 152 mg/dL — ABNORMAL HIGH (ref 70–99)
Glucose-Capillary: 236 mg/dL — ABNORMAL HIGH (ref 70–99)
Glucose-Capillary: 107 mg/dL — ABNORMAL HIGH (ref 70–99)
Glucose-Capillary: 118 mg/dL — ABNORMAL HIGH (ref 70–99)
Glucose-Capillary: 121 mg/dL — ABNORMAL HIGH (ref 70–99)
Glucose-Capillary: 128 mg/dL — ABNORMAL HIGH (ref 70–99)

## 2020-10-15 LAB — CBC
HCT: 25.7 % — ABNORMAL LOW (ref 36.0–46.0)
Hemoglobin: 8.5 g/dL — ABNORMAL LOW (ref 12.0–15.0)
MCH: 29.2 pg (ref 26.0–34.0)
MCHC: 33.1 g/dL (ref 30.0–36.0)
MCV: 88.3 fL (ref 80.0–100.0)
Platelets: 245 10*3/uL (ref 150–400)
RBC: 2.91 MIL/uL — ABNORMAL LOW (ref 3.87–5.11)
RDW: 13.9 % (ref 11.5–15.5)
WBC: 21.9 10*3/uL — ABNORMAL HIGH (ref 4.0–10.5)
nRBC: 0.1 % (ref 0.0–0.2)

## 2020-10-15 LAB — PROTIME-INR
INR: 1.2 (ref 0.8–1.2)
Prothrombin Time: 15.4 seconds — ABNORMAL HIGH (ref 11.4–15.2)

## 2020-10-15 LAB — TROPONIN I (HIGH SENSITIVITY)
Troponin I (High Sensitivity): 74 ng/L — ABNORMAL HIGH (ref <18)
Troponin I (High Sensitivity): 173 ng/L (ref <18)
Troponin I (High Sensitivity): 170 ng/L (ref <18)

## 2020-10-15 LAB — TYPE AND SCREEN
ABO/RH(D): A POS
ABO/RH(D): A POS
Antibody Screen: NEGATIVE
Antibody Screen: NEGATIVE

## 2020-10-15 LAB — LACTIC ACID, PLASMA
Lactic Acid, Venous: 6.1 mmol/L (ref 0.5–1.9)
Lactic Acid, Venous: 1.9 mmol/L (ref 0.5–1.9)

## 2020-10-15 LAB — BLOOD GAS, ARTERIAL
Acid-base deficit: 8.6 mmol/L — ABNORMAL HIGH (ref 0.0–2.0)
Bicarbonate: 17.2 mmol/L — ABNORMAL LOW (ref 20.0–28.0)
FIO2: 100
O2 Saturation: 98.4 %
Patient temperature: 36.9
pCO2 arterial: 42.4 mmHg (ref 32.0–48.0)
pH, Arterial: 7.238 — ABNORMAL LOW (ref 7.350–7.450)
pO2, Arterial: 167 mmHg — ABNORMAL HIGH (ref 83.0–108.0)

## 2020-10-15 LAB — D-DIMER, QUANTITATIVE: D-Dimer, Quant: 4.14 ug/mL-FEU — ABNORMAL HIGH (ref 0.00–0.50)

## 2020-10-15 LAB — APTT: aPTT: 33 seconds (ref 24–36)

## 2020-10-15 LAB — MAGNESIUM: Magnesium: 2.4 mg/dL (ref 1.7–2.4)

## 2020-10-15 MED ORDER — HEPARIN BOLUS VIA INFUSION
4000.0000 [IU] | Freq: Once | INTRAVENOUS | Status: AC
  Administered 2020-10-15: 4000 [IU] via INTRAVENOUS
  Filled 2020-10-15: qty 4000

## 2020-10-15 MED ORDER — PHENYLEPHRINE HCL-NACL 20-0.9 MG/250ML-% IV SOLN: 0.0000 ug/min | INTRAVENOUS | Status: DC

## 2020-10-15 MED ORDER — GABAPENTIN 100 MG PO CAPS: 200.0000 mg | ORAL_CAPSULE | Freq: Two times a day (BID) | ORAL | Status: DC

## 2020-10-15 MED ORDER — VANCOMYCIN VARIABLE DOSE PER UNSTABLE RENAL FUNCTION (PHARMACIST DOSING): Status: DC

## 2020-10-15 MED ORDER — CHLORHEXIDINE GLUCONATE 0.12% ORAL RINSE (MEDLINE KIT)
15.0000 mL | Freq: Two times a day (BID) | OROMUCOSAL | Status: DC
  Administered 2020-10-15 – 2020-10-30 (×30): 15 mL via OROMUCOSAL

## 2020-10-15 MED ORDER — METOPROLOL TARTRATE 50 MG PO TABS
75.0000 mg | ORAL_TABLET | Freq: Two times a day (BID) | ORAL | Status: DC
  Administered 2020-10-15 – 2020-10-21 (×12): 75 mg via NASOGASTRIC
  Filled 2020-10-15 (×13): qty 1

## 2020-10-15 MED ORDER — INSULIN ASPART 100 UNIT/ML IJ SOLN
4.0000 [IU] | Freq: Once | INTRAMUSCULAR | Status: AC
  Administered 2020-10-15: 4 [IU] via SUBCUTANEOUS

## 2020-10-15 MED ORDER — HYDRALAZINE HCL 50 MG PO TABS
50.0000 mg | ORAL_TABLET | Freq: Three times a day (TID) | ORAL | Status: DC
  Administered 2020-10-15 – 2020-10-21 (×15): 50 mg
  Filled 2020-10-15 (×16): qty 1

## 2020-10-15 MED ORDER — HYDROCODONE-ACETAMINOPHEN 5-325 MG PO TABS
1.0000 | ORAL_TABLET | Freq: Four times a day (QID) | ORAL | Status: DC | PRN
  Administered 2020-10-17 – 2020-10-18 (×2): 1
  Filled 2020-10-15 (×2): qty 1

## 2020-10-15 MED ORDER — SODIUM BICARBONATE 650 MG PO TABS
650.0000 mg | ORAL_TABLET | Freq: Three times a day (TID) | ORAL | Status: DC
  Administered 2020-10-15 – 2020-10-19 (×12): 650 mg via NASOGASTRIC
  Filled 2020-10-15 (×12): qty 1

## 2020-10-15 MED ORDER — EZETIMIBE 10 MG PO TABS
10.0000 mg | ORAL_TABLET | Freq: Every day | ORAL | Status: DC
  Administered 2020-10-16 – 2020-10-30 (×15): 10 mg
  Filled 2020-10-15 (×15): qty 1

## 2020-10-15 MED ORDER — SODIUM CHLORIDE 0.9 % IV BOLUS (SEPSIS)
1000.0000 mL | Freq: Once | INTRAVENOUS | Status: AC
  Administered 2020-10-15: 1000 mL via INTRAVENOUS

## 2020-10-15 MED ORDER — CIPROFLOXACIN IN D5W 400 MG/200ML IV SOLN: 400.0000 mg | Freq: Two times a day (BID) | INTRAVENOUS | Status: DC

## 2020-10-15 MED ORDER — HEPARIN (PORCINE) 25000 UT/250ML-% IV SOLN
1100.0000 [IU]/h | INTRAVENOUS | Status: DC
  Administered 2020-10-15: 1100 [IU]/h via INTRAVENOUS
  Filled 2020-10-15: qty 250

## 2020-10-15 MED ORDER — SODIUM CHLORIDE 0.9 % IV SOLN
1.0000 g | Freq: Every day | INTRAVENOUS | Status: DC
  Administered 2020-10-15 – 2020-10-16 (×2): 1 g via INTRAVENOUS
  Filled 2020-10-15 (×5): qty 1

## 2020-10-15 MED ORDER — ORAL CARE MOUTH RINSE
15.0000 mL | OROMUCOSAL | Status: DC
  Administered 2020-10-15 – 2020-10-30 (×147): 15 mL via OROMUCOSAL

## 2020-10-15 MED ORDER — GABAPENTIN 250 MG/5ML PO SOLN
200.0000 mg | Freq: Two times a day (BID) | ORAL | Status: DC
  Administered 2020-10-15 – 2020-10-28 (×27): 200 mg
  Filled 2020-10-15 (×27): qty 4

## 2020-10-15 MED ORDER — INSULIN ASPART 100 UNIT/ML IJ SOLN: 0.0000 [IU] | INTRAMUSCULAR | Status: DC

## 2020-10-15 MED ORDER — CALCITRIOL 0.25 MCG PO CAPS
0.2500 ug | ORAL_CAPSULE | Freq: Every day | ORAL | Status: DC
  Administered 2020-10-16 – 2020-10-17 (×2): 0.25 ug via NASOGASTRIC
  Filled 2020-10-15 (×2): qty 1

## 2020-10-15 MED ORDER — SODIUM CHLORIDE 0.9 % IV SOLN: 2.0000 g | Freq: Once | INTRAVENOUS | Status: DC

## 2020-10-15 MED ORDER — PREDNISOLONE ACETATE 1 % OP SUSP
1.0000 [drp] | Freq: Two times a day (BID) | OPHTHALMIC | Status: DC
  Administered 2020-10-15 – 2020-10-29 (×29): 1 [drp] via OPHTHALMIC
  Filled 2020-10-15: qty 5

## 2020-10-15 MED ORDER — SODIUM CHLORIDE 0.9 % IV SOLN: INTRAVENOUS | Status: DC

## 2020-10-15 MED ORDER — FEBUXOSTAT 40 MG PO TABS
40.0000 mg | ORAL_TABLET | Freq: Every day | ORAL | Status: DC
  Administered 2020-10-16 – 2020-10-30 (×15): 40 mg via NASOGASTRIC
  Filled 2020-10-15 (×15): qty 1

## 2020-10-15 MED ORDER — PRAVASTATIN SODIUM 40 MG PO TABS
20.0000 mg | ORAL_TABLET | Freq: Every day | ORAL | Status: DC
  Administered 2020-10-16 – 2020-10-30 (×15): 20 mg via NASOGASTRIC
  Filled 2020-10-15 (×16): qty 1

## 2020-10-15 MED ORDER — DILTIAZEM 12 MG/ML ORAL SUSPENSION
60.0000 mg | Freq: Three times a day (TID) | ORAL | Status: DC
  Administered 2020-10-16 – 2020-10-18 (×8): 60 mg
  Filled 2020-10-15 (×12): qty 6

## 2020-10-15 MED ORDER — VANCOMYCIN HCL 1500 MG/300ML IV SOLN
1500.0000 mg | Freq: Once | INTRAVENOUS | Status: AC
  Administered 2020-10-15: 1500 mg via INTRAVENOUS
  Filled 2020-10-15: qty 300

## 2020-10-15 MED ORDER — VANCOMYCIN HCL IN DEXTROSE 1-5 GM/200ML-% IV SOLN: 1000.0000 mg | Freq: Once | INTRAVENOUS | Status: DC

## 2020-10-15 MED ORDER — SODIUM BICARBONATE 8.4 % IV SOLN
100.0000 meq | Freq: Once | INTRAVENOUS | Status: AC
  Administered 2020-10-15: 100 meq via INTRAVENOUS
  Filled 2020-10-15: qty 100

## 2020-10-15 NOTE — Progress Notes (Signed)
Einar Pheasant, RN stated that MD wanted tube pulled back 4cm.  Patient had not had adequate sedation so she was gumming the tube and tube was at 27cm.  Pulled tube back to 23cm and secured.  Precedex is running now.

## 2020-10-15 NOTE — Progress Notes (Signed)
Spoke with PCCM - with down trending hemoglobin, and this subcapsular hematoma, we have decided to d/c the heparin. She advised Sepsis treatment with the up-trending leukocytosis, lactic acid, hypotension etc. Patient has allergy to Penc so aztreonam and vanc started. Urine culture, blood culture, UA ordered. Will carefully monitor as we fluid bolus. Plan is for PCCM to review chart and call me back with further rec's for stabilization prior to transfer.

## 2020-10-15 NOTE — ED Provider Notes (Signed)
I was called to patient's room for central line placement.  She has apparently maxed out on peripheral Levophed and required central line for vasopressor infusion.  Central line was placed as below.   CENTRAL LINE Performed by: Veryl Speak Consent: The procedure was performed in an emergent situation. Required items: required blood products, implants, devices, and special equipment available Patient identity confirmed: arm band and provided demographic data Time out: Immediately prior to procedure a "time out" was called to verify the correct patient, procedure, equipment, support staff and site/side marked as required. Indications: vascular access Anesthesia: local infiltration Local anesthetic: lidocaine 1% with epinephrine Anesthetic total: 3 ml Patient sedated: no Preparation: skin prepped with 2% chlorhexidine Skin prep agent dried: skin prep agent completely dried prior to procedure Sterile barriers: all five maximum sterile barriers used - cap, mask, sterile gown, sterile gloves, and large sterile sheet Hand hygiene: hand hygiene performed prior to central venous catheter insertion  Location details: right femoral vein  Catheter type: triple lumen Catheter size: 8 Fr Pre-procedure: landmarks identified Ultrasound guidance: No Successful placement: yes Post-procedure: line sutured and dressing applied Assessment: blood return through all parts, free fluid flow, placement verified by x-ray and no pneumothorax on x-ray Patient tolerance: Patient tolerated the procedure well with no immediate complications.    Veryl Speak, MD 10/15/20 [phone]

## 2020-10-15 NOTE — Progress Notes (Signed)
[facility] NEPHROLOGY PROGRESS NOTE  Assessment/ Plan: Pt is a 70 y.o. yo female  with history of hypertension, diabetes, anemia, secondary hyperparathyroidism, stroke, hypertrophic obstructive cardiomyopathy, HLD, obesity, uterine cancer, CKD stage IV, seen as a consultation for the evaluation of AKI on CKD 4.   #Acute kidney injury on CKD stage IV: Patient has longstanding CKD with baseline creatinine level seems to be around 2.6-2.9 however recently the GFR declined and creatinine level around 3.6 as outpatient.  Reportedly the biopsy result came back hypertensive and diabetic changes with severe interstitial fibrosis and tubular atrophy with mostly chronic changes.  She has a rapid decline in kidney function with borderline urine output on IV diuretics. -rise in Cr today likely related to hypoperfusion from cardiac arrest/hypotension. Would continue with lasix -Suspecting that she is heading towards needing renal replacement therapy while here, no urgent indication for RRT as of right now but would have a low threshold to start CRRT if needed -monitor strict I/O, daily labs    #Post biopsy left kidney hematoma: 8 x 4 x 12 cm subcapsular hematoma along the left kidney leading to mass-effect on the renal parenchyma.  IR was consulted, no plan for embolization.  Continue to monitor CBC and imaging studies.  #Cardiac arrest 9/16-asystole -brief course of CPR, 1 round of epi -pressors being weaned down   #Hyponatremia, hypervolemic: Seems to be chronic.  Asymptomatic.  On torsemide and metolazone as outpatient.  Serum sodium level improving with loop diuretics  #Acute urinary retention: now w/ foley  #Metabolic acidosis: Started on oral sodium bicarbonate.   #Hypertension, now hypotensive: pressor support per primary service   #Anemia of CKD and blood loss postbiopsy: Monitor CBC and transfuse as needed.  Subjective: Seen and examined at bedside in ICU. Unfortuantely, had  cardiac arrest overnight-asystole, brief course of CPR per staff. Levo being titrated down. Transferred from [facility].  Objective Vital signs in last 24 hours: Vitals:   10/15/20 1108 10/15/20 1115 10/15/20 1130 10/15/20 1145  BP:  (!) 157/69 (!) 150/64 (!) 160/68  Pulse:  67 66 65  Resp:  16 17 17   Temp:  98.2 F (36.8 C)    TempSrc:  Oral    SpO2: 98% 99% 100% 100%  Weight:  85.7 kg    Height: 5\' 4"  (1.626 m) 5\' 4"  (1.626 m)     Weight change:   Intake/Output Summary (Last 24 hours) at 10/15/2020 1219 Last data filed at 10/15/2020 1200 Gross per 24 hour  Intake 1252.78 ml  Output 170 ml  Net 1082.78 ml       Labs: Basic Metabolic Panel: Recent Labs  Lab 10/12/20 0422 10/13/20 0205 10/13/20 0814 10/14/20 0505 10/14/20 2322 10/15/20 0253  NA 126*   < > 124* 126* 124* 126*  K 4.5   < > 5.0 4.9 5.5* 5.1  CL 91*   < > 94* 94* 90* 90*  CO2 23   < > 20* 19* 18* 23  GLUCOSE 54*   < > 161* 175* 265* 172*  BUN 94*   < > 97* 85* 102* 101*  CREATININE 4.46*   < > 4.66* 5.25* 5.32* 5.56*  CALCIUM 9.1   < > 8.7* 8.4* 7.8* 8.0*  PHOS 6.2*  --  6.8*  --   --   --    < > = values in this interval not displayed.   Liver Function Tests: Recent Labs  Lab 10/12/20 0422 10/13/20 0814 10/14/20 2322 10/15/20 0253  AST  24  --  133* 183*  ALT 34  --  101* 121*  ALKPHOS 76  --  83 97  BILITOT 0.5  --  0.7 1.2  PROT 6.4*  --  6.3* 6.5  ALBUMIN 3.1* 2.8* 3.2* 3.3*   Recent Labs  Lab 10/09/2020 1738  LIPASE 37   No results for input(s): AMMONIA in the last 168 hours. CBC: Recent Labs  Lab 10/06/2020 1738 10/12/20 0422 10/13/20 0205 10/13/20 5329 10/14/20 0505 10/14/20 2323 10/15/20 0253  WBC 15.4*   < > 17.0* 17.6* 13.4* 18.3* 21.9*  NEUTROABS 12.7*  --   --   --   --   --   --   HGB 11.8*   < > 9.5* 9.5* 8.5* 7.8* 8.5*  HCT 35.7*   < > 28.6* 28.9* 25.9* 25.2* 25.7*  MCV 88.1   < > 88.0 88.1 89.6 93.3 88.3  PLT 270   < > 214 220 199 228 245   < > = values in this  interval not displayed.   Cardiac Enzymes: No results for input(s): CKTOTAL, CKMB, CKMBINDEX, TROPONINI in the last 168 hours. CBG: Recent Labs  Lab 10/14/20 2103 10/14/20 2314 10/15/20 0009 10/15/20 0804 10/15/20 1144  GLUCAP 186* 199* 236* 152* 107*    Iron Studies: No results for input(s): IRON, TIBC, TRANSFERRIN, FERRITIN in the last 72 hours. Studies/Results: DG Abd 1 View  Result Date: 10/15/2020 CLINICAL DATA:  Central line placement EXAM: ABDOMEN - 1 VIEW COMPARISON:  Abdominal CT from 4 days ago FINDINGS: Right femoral line with tip at the level of the right sacral ala. Based on atheromatous calcification the tip is likely separate from the external iliac artery. No acute osseous or bowel findings. Partially covered enteric tube looping in the stomach. IMPRESSION: Unremarkable right femoral line. Electronically Signed   By: Jorje Guild M.D.   On: 10/15/2020 04:30   DG CHEST PORT 1 VIEW  Result Date: 10/15/2020 CLINICAL DATA:  Endotracheal tube repositioned EXAM: PORTABLE CHEST 1 VIEW COMPARISON:  10/14/2020 11:33 p.m. FINDINGS: Interval repositioning of the endotracheal tube, which now terminates approximately 3.5 cm above the carina. Enteric tube extends below the inferior field of view. Redemonstrated cardiomegaly and central pulmonary vascular congestion. Redemonstrated interstitial opacities in the perihilar regions. No pleural effusion or pneumothorax. No acute osseous abnormality. IMPRESSION: 1. Interval repositioning of the endotracheal tube, which is now in appropriate position. Otherwise stable support apparatus. 2. Redemonstrated cardiomegaly and pulmonary edema. Electronically Signed   By: Merilyn Baba M.D.   On: 10/15/2020 00:50   DG CHEST PORT 1 VIEW  Result Date: 10/14/2020 CLINICAL DATA:  Code blue.  Post intubation and orogastric tube. EXAM: PORTABLE CHEST 1 VIEW COMPARISON:  Chest x-ray 10/12/2020. FINDINGS: There is right mainstem intubation. Enteric tube  extends below the diaphragm. The tip of the enteric tube is not included on the exam. The heart is enlarged. There is increasing central pulmonary vascular congestion. There are increasing interstitial opacities in the perihilar regions. There is a calcified nodule in the right lung base. There is no pleural effusion or pneumothorax. No acute fractures are seen. IMPRESSION: 1. Right mainstem intubation. Recommend retraction of endotracheal tube approximately 4 cm. 2. The enteric tube extends below the diaphragm, but tip is not visualized. 3. Cardiomegaly with increasing pulmonary edema. 4. These results were called by telephone at the time of interpretation on 10/14/2020 at 11:47 pm to provider ASIA ZIERLE-GHOSH , who verbally acknowledged these results. Electronically Signed  By: Ronney Asters M.D.   On: 10/14/2020 23:47   ECHOCARDIOGRAM COMPLETE  Result Date: 10/14/2020    ECHOCARDIOGRAM REPORT   Patient Name:   Krista Strickland Date of Exam: 10/13/2020 Medical Rec #:  [id number]        Height:       62.0 in Accession #:    [id number]       Weight:       177.9 lb Date of Birth:  03-12-1950        BSA:          1.819 m Patient Age:    39 years         BP:           186/53 mmHg Patient Gender: F                HR:           68 bpm. Exam Location:  [facility] Procedure: 2D Echo, Cardiac Doppler and Color Doppler Indications:    Hypertrophic cardiomyopathy  History:        Patient has prior history of Echocardiogram examinations, most                 recent 09/18/2019. Stroke; Risk Factors:Hypertension, Diabetes                 and Dyslipidemia. Hypertrophic cardiomyopathy.  Sonographer:    Wenda Low Referring Phys: Erma Heritage IMPRESSIONS  1. Left ventricular ejection fraction, by estimation, is 70 to 75%. The left ventricle has hyperdynamic function. The left ventricle has no regional wall motion abnormalities. There is severe concentric left ventricular hypertrophy. Left ventricular diastolic  parameters are indeterminate. Elevated left ventricular end-diastolic pressure. Resting LVOT gradient of 36 mmHg.  2. Right ventricular systolic function is normal. The right ventricular size is normal. Tricuspid regurgitation signal is inadequate for assessing PA pressure.  3. Left atrial size was moderately dilated.  4. Left pleural effusion is present.  5. The mitral valve is abnormal. Mild to moderate mitral valve regurgitation. Severe mitral annular calcification.  6. The aortic valve is tricuspid. There is moderate calcification of the aortic valve. Aortic valve regurgitation is not visualized. Mild to moderate aortic valve sclerosis/calcification is present, without any evidence of aortic stenosis. Aortic valve mean gradient measures 7.0 mmHg.  7. The inferior vena cava is normal in size with greater than 50% respiratory variability, suggesting right atrial pressure of 3 mmHg. Comparison(s): No significant change from prior study. FINDINGS  Left Ventricle: Left ventricular ejection fraction, by estimation, is 70 to 75%. The left ventricle has hyperdynamic function. The left ventricle has no regional wall motion abnormalities. The left ventricular internal cavity size was normal in size. There is severe concentric left ventricular hypertrophy. Left ventricular diastolic parameters are indeterminate. Elevated left ventricular end-diastolic pressure. Right Ventricle: The right ventricular size is normal. No increase in right ventricular wall thickness. Right ventricular systolic function is normal. Tricuspid regurgitation signal is inadequate for assessing PA pressure. Left Atrium: Left atrial size was moderately dilated. Right Atrium: Right atrial size was normal in size. Pericardium: Left pleural effusion is present. There is no evidence of pericardial effusion. Presence of pericardial fat pad. Mitral Valve: The mitral valve is abnormal. Severe mitral annular calcification. Mild to moderate mitral valve  regurgitation. MV peak gradient, 26.3 mmHg. The mean mitral valve gradient is 13.0 mmHg. Tricuspid Valve: The tricuspid valve is grossly normal. Tricuspid valve regurgitation is trivial. Aortic Valve: The  aortic valve is tricuspid. There is moderate calcification of the aortic valve. Aortic valve regurgitation is not visualized. Mild to moderate aortic valve sclerosis/calcification is present, without any evidence of aortic stenosis. Aortic valve mean gradient measures 7.0 mmHg. Aortic valve peak gradient measures 13.7 mmHg. Aortic valve area, by VTI measures 3.27 cm. Pulmonic Valve: The pulmonic valve was grossly normal. Pulmonic valve regurgitation is trivial. Aorta: The aortic root is normal in size and structure. Venous: The inferior vena cava is normal in size with greater than 50% respiratory variability, suggesting right atrial pressure of 3 mmHg. IAS/Shunts: No atrial level shunt detected by color flow Doppler. Additional Comments: There is pleural effusion in the left lateral region.  LEFT VENTRICLE PLAX 2D LVIDd:         4.20 cm  Diastology LVIDs:         2.80 cm  LV e' medial:    5.00 cm/s LV PW:         1.70 cm  LV E/e' medial:  42.6 LV IVS:        1.70 cm  LV e' lateral:   6.53 cm/s LVOT diam:     1.90 cm  LV E/e' lateral: 32.6 LV SV:         132 LV SV Index:   73 LVOT Area:     2.84 cm  RIGHT VENTRICLE RV Basal diam:  3.35 cm RV Mid diam:    3.00 cm RV S prime:     10.60 cm/s TAPSE (M-mode): 2.6 cm LEFT ATRIUM             Index       RIGHT ATRIUM           Index LA diam:        4.70 cm 2.58 cm/m  RA Area:     15.50 cm LA Vol (A2C):   97.2 ml 53.44 ml/m RA Volume:   42.50 ml  23.37 ml/m LA Vol (A4C):   81.6 ml 44.86 ml/m LA Biplane Vol: 90.2 ml 49.59 ml/m  AORTIC VALVE AV Area (Vmax):    3.40 cm AV Area (Vmean):   3.49 cm AV Area (VTI):     3.27 cm AV Vmax:           185.00 cm/s AV Vmean:          122.500 cm/s AV VTI:            0.405 m AV Peak Grad:      13.7 mmHg AV Mean Grad:      7.0 mmHg  LVOT Vmax:         222.00 cm/s LVOT Vmean:        151.000 cm/s LVOT VTI:          0.467 m LVOT/AV VTI ratio: 1.15  AORTA Ao Root diam: 2.40 cm MITRAL VALVE MV Area (PHT): 3.02 cm     SHUNTS MV Area VTI:   1.99 cm     Systemic VTI:  0.47 m MV Peak grad:  26.3 mmHg    Systemic Diam: 1.90 cm MV Mean grad:  13.0 mmHg MV Vmax:       2.56 m/s MV Vmean:      168.0 cm/s MV Decel Time: 251 msec MV E velocity: 213.00 cm/s MV A velocity: 223.00 cm/s MV E/A ratio:  0.96 Rozann Lesches MD Electronically signed by Rozann Lesches MD Signature Date/Time: 10/14/2020/1:02:22 PM    Final     Medications: Infusions:  sodium chloride     sodium chloride     ceFEPime (MAXIPIME) IV 200 mL/hr at 10/15/20 0601   dexmedetomidine (PRECEDEX) IV infusion 0.9 mcg/kg/hr (10/15/20 1200)   norepinephrine (LEVOPHED) Adult infusion Stopped (10/15/20 1109)   phenylephrine (NEO-SYNEPHRINE) Adult infusion      Scheduled Medications:  calcitRIOL  0.25 mcg Oral Daily   Chlorhexidine Gluconate Cloth  6 each Topical Daily   diltiazem  180 mg Oral Daily   ezetimibe  10 mg Oral Daily   febuxostat  40 mg Oral Daily   feeding supplement (GLUCERNA SHAKE)  237 mL Oral TID BM   furosemide  80 mg Intravenous Q12H   gabapentin  200 mg Oral BID   hydrALAZINE  50 mg Oral Q8H   influenza vaccine adjuvanted  0.5 mL Intramuscular Tomorrow-1000   insulin aspart  0-5 Units Subcutaneous QHS   insulin aspart  0-6 Units Subcutaneous TID WC   insulin aspart protamine- aspart  50 Units Subcutaneous Q breakfast   metoprolol tartrate  75 mg Oral BID   pravastatin  20 mg Oral QAC breakfast   sodium bicarbonate  650 mg Oral TID   vancomycin variable dose per unstable renal function (pharmacist dosing)   Does not apply See admin instructions    have reviewed scheduled and prn medications.  Physical Exam: General:intubated, sedated Heart:RRR Lungs:intuabted, bl chest expansion, no crackles Abdomen:soft, Non-tender, non-distended Extremities:  Bilateral ankle edema present+ Neuro: sedated  Krista Strickland 10/15/2020,12:19 PM  LOS: 3 days

## 2020-10-15 NOTE — ED Provider Notes (Signed)
ED physician note:  I was called to the patient's room regarding possible cardiac arrest.  A CODE BLUE was initiated by nursing staff.  From what I was told, patient was walking back from the bathroom when she complained of difficulty breathing.  She then sat down on the bed, then went unresponsive.    When I arrived at the patient's room, she was lying supine on the bed and I was unable to palpate pulses.  Patient was unresponsive with GCS of 3.  CPR was then initiated and patient was given 1 mg of epinephrine.  After several minutes of CPR, pulses were palpable and ROSC was achieved.  Patient remained unresponsive and not protecting her airway.  We then proceeded with intubation.  Patient was given 20 mg of etomidate followed by 150 mg of succinylcholine.  Intubation was performed using a #3 MacIntosh glide scope blade.  A 7.5 endotracheal tube was easily placed with tube placement being confirmed by direct visualization, end-tidal CO2, and auscultation over the stomach and lungs.  Dr. Clearence Ped then took over care and I returned to my Emergency Department duties.  [facility]  Department of Emergency Medicine   Code Blue CONSULT NOTE  Chief Complaint: Cardiac arrest/unresponsive   Level V Caveat: Unresponsive  History of present illness: I was contacted by the hospital for a CODE BLUE cardiac arrest upstairs and presented to the patient's bedside.    ROS: Unable to obtain, Level V caveat  Scheduled Meds:  calcitRIOL  0.25 mcg Oral Daily   Chlorhexidine Gluconate Cloth  6 each Topical Daily   diltiazem  180 mg Oral Daily   ezetimibe  10 mg Oral Daily   febuxostat  40 mg Oral Daily   feeding supplement (GLUCERNA SHAKE)  237 mL Oral TID BM   furosemide  80 mg Intravenous Q12H   gabapentin  200 mg Oral BID   hydrALAZINE  50 mg Oral Q8H   influenza vaccine adjuvanted  0.5 mL Intramuscular Tomorrow-1000   insulin aspart  0-5 Units Subcutaneous QHS   insulin aspart   0-6 Units Subcutaneous TID WC   insulin aspart protamine- aspart  50 Units Subcutaneous Q breakfast   metoprolol tartrate  75 mg Oral BID   pravastatin  20 mg Oral QAC breakfast   sodium bicarbonate  650 mg Oral TID   vancomycin variable dose per unstable renal function (pharmacist dosing)   Does not apply See admin instructions   Continuous Infusions:  sodium chloride     sodium chloride     ceFEPime (MAXIPIME) IV 1 g (10/15/20 0559)   dexmedetomidine (PRECEDEX) IV infusion 0.6 mcg/kg/hr (10/15/20 0450)   norepinephrine (LEVOPHED) Adult infusion 15 mcg/min (10/15/20 0439)   phenylephrine (NEO-SYNEPHRINE) Adult infusion     vancomycin     PRN Meds:.albuterol, hydrALAZINE, HYDROcodone-acetaminophen, HYDROmorphone (DILAUDID) injection, ondansetron (ZOFRAN) IV Past Medical History:  Diagnosis Date   Anemia    Arthritis    CKD (chronic kidney disease) stage 3, GFR 30-59 ml/min (HCC)    Diabetes mellitus, type 2 ([place])    Essential hypertension    GERD (gastroesophageal reflux disease)    Gout    History of MRSA infection 03/2009   History of stroke 2013   HOCM (hypertrophic obstructive cardiomyopathy) ([place])    Hypercalcemia 2017   Managed by nephrology   Hyperlipidemia    Obesity    Psoriasis    Uterine cancer ([place])    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   Past  Surgical History:  Procedure Laterality Date   ABDOMINAL HYSTERECTOMY     APPENDECTOMY  2012   COLONOSCOPY WITH PROPOFOL N/A 08/24/2016   Procedure: COLONOSCOPY WITH PROPOFOL;  Surgeon: Rogene Houston, MD;  Location: [facility];  Service: Endoscopy;  Laterality: N/A;  10:30   Excison of right breast cyst     LAPAROSCOPIC SALPINGO OOPHERECTOMY Right 04/22/2012   Procedure: LAPAROSCOPIC SALPINGO OOPHORECTOMY;  Surgeon: Jonnie Kind, MD;  Location: [facility] ORS;  Service: Gynecology;  Laterality: Right;  end 11:17   MASS EXCISION N/A 04/22/2012   Procedure: EXCISION SKIN TAGS NECK AND HEAD;  Surgeon: Jonnie Kind, MD;   Location: [facility] OR[facility];  Service: Gynecology;  Laterality: N/A;  start 11:19   PARTIAL HYSTERECTOMY     POLYPECTOMY  08/24/2016   Procedure: POLYPECTOMY;  Surgeon: Rogene Houston, MD;  Location: [facility];  Service: Endoscopy;;  colon   Right neck biopsy     Social History   Socioeconomic History   Marital status: Married    Spouse name: Not on file   Number of children: 1   Years of education: Not on file   Highest education level: 11th grade  Occupational History   Occupation: disabled     Employer: UNEMPLOYED  Tobacco Use   Smoking status: Former    Packs/day: 0.25    Years: 12.00    Pack years: 3.00    Types: Cigarettes    Quit date: 07/16/1978    Years since quitting: 42.2   Smokeless tobacco: Never  Vaping Use   Vaping Use: Never used  Substance and Sexual Activity   Alcohol use: No   Drug use: No   Sexual activity: Yes    Birth control/protection: Surgical  Other Topics Concern   Not on file  Social History Narrative   Not on file   Social Determinants of Health   Financial Resource Strain: Low Risk    Difficulty of Paying Living Expenses: Not hard at all  Food Insecurity: No Food Insecurity   Worried About Charity fundraiser in the Last Year: Never true   [place] in the Last Year: Never true  Transportation Needs: No Transportation Needs   Lack of Transportation (Medical): No   Lack of Transportation (Non-Medical): No  Physical Activity: Inactive   Days of Exercise per Week: 0 days   Minutes of Exercise per Session: 0 min  Stress: No Stress Concern Present   Feeling of Stress : Only a little  Social Connections: Socially Isolated   Frequency of Communication with Friends and Family: Never   Frequency of Social Gatherings with Friends and Family: Never   Attends Religious Services: Never   Printmaker: No   Attends Music therapist: Never   Marital Status: Married  Human resources officer Violence: Not At Risk    Fear of Current or Ex-Partner: No   Emotionally Abused: No   Physically Abused: No   Sexually Abused: No   Allergies  Allergen Reactions   Benazepril Swelling   Metronidazole Hives   Mobic [Meloxicam] Hives   Penicillins Hives and Swelling   Sulfonamide Derivatives Hives    Last set of Vital Signs (not current) Vitals:   10/15/20 0430 10/15/20 0500  BP: (!) 141/53 (!) 139/54  Pulse: 67 69  Resp: 13 14  Temp: (!) 97.1 F (36.2 C)   SpO2: 98% 99%      Physical Exam  Gen: unresponsive  Cardiovascular: pulseless  Resp: apneic. Breath sounds equal bilaterally with bagging  Abd: nondistended  Neuro: GCS 3, unresponsive to pain  HEENT: No blood in posterior pharynx, gag reflex absent  Neck: No crepitus  Musculoskeletal: No deformity  Skin: warm  Procedures  INTUBATION Performed by: Veryl Speak Required items: required blood products, implants, devices, and special equipment available Patient identity confirmed: provided demographic data and hospital-assigned identification number Time out: Immediately prior to procedure a "time out" was called to verify the correct patient, procedure, equipment, support staff and site/side marked as required. Indications: Cardiac arrest/respiratory failure Intubation method: Glide scope Preoxygenation: BVM Sedatives: 20 mg etomidate Paralytic: 150 mg succinylcholine Tube Size: 7.5 cuffed Post-procedure assessment: chest rise and ETCO2 monitor Breath sounds: equal and absent over the epigastrium Tube secured by Respiratory Therapy Patient tolerated the procedure well with no immediate complications.  CRITICAL CARE Performed by: Veryl Speak Total critical care time: 35 Critical care time was exclusive of separately billable procedures and treating other patients. Critical care was necessary to treat or prevent imminent or life-threatening deterioration. Critical care was time spent personally by me on the following activities:  development of treatment plan with patient and/or surrogate as well as nursing, discussions with consultants, evaluation of patient's response to treatment, examination of patient, obtaining history from patient or surrogate, ordering and performing treatments and interventions, ordering and review of laboratory studies, ordering and review of radiographic studies, pulse oximetry and re-evaluation of patient's condition.  Cardiopulmonary Resuscitation (CPR) Procedure Note  Directed/Performed by: Veryl Speak I personally directed ancillary staff and/or performed CPR in an effort to regain return of spontaneous circulation and to maintain cardiac, neuro and systemic perfusion.    Medical Decision making  Patient with cardiac arrest of undetermined etiology.  PE considered, however patient with creatinine of 5 and CTA unrealistic given renal function.  Assessment and Plan   Patient status postcardiac arrest with ROSC achieved.  Care resumed by the hospitalist.   Veryl Speak, MD 10/15/20 [phone]

## 2020-10-15 NOTE — H&P (Signed)
NAME:  Krista Strickland, MRN:  [id number], DOB:  1950/08/17, LOS: 3 ADMISSION DATE:  10/10/2020, CONSULTATION DATE: 10/15/2020 REFERRING MD: Dr. Roger Shelter, CHIEF COMPLAINT: Cardiac arrest  History of Present Illness:  Krista Strickland is a 70 y.o. female with a past medical history significant for CKD stage IV, hypertension, type 2 diabetes, GERD, hyperlipidemia, and hypertrophic cardiomyopathy who presented to the emergency department) with left lower quadrant abdominal pain that began day of admission.  Of note patient had renal biopsy 10/07/2020.  On ED arrival patient was seen hypertensive with all other vital signs within normal limits.  Due to abdominal pain complaints with recent liver biopsy CT abdomen pelvis was obtained and revealed 8 x 4 x 12 cm left present subscapular hematoma for which intervention radiology was consulted and recommended monitoring but if symptoms worsen and arteriogram could be considered.  Late evening of 9/16 patient suffered cardiac arrest.  After returning from the restroom patient had episode dyspnea followed by bradycardia and later asystole.  She received 1 mg of epi with ACLS/CPR with ROSC achieved and was seen with spontaneous respirations but significantly diminished mentation resulting in decision to endotracheally intubate.  Given sudden onset of dyspnea post ambulation with resultant bradycardia concern for PE as cause of cardiac arrest was very high given renal function decision was made to forego CTA and began therapeutic heparin.  Heparin drip was later discontinued due to downtrending of hemoglobin.  PCCM was consulted for further management and decision was made to transfer patient to [facility] for high-level care.  Pertinent  Medical History  CKD stage IV Type 2 diabetes Essential hypertension Hypertrophic obstructive cardiomyopathy Hyperlipidemia Uterine cancer Anemia Arthritis GERD History of MRSA infection February 2011  Significant Hospital  Events: Including procedures, antibiotic start and stop dates in addition to other pertinent events   9/13 admitted to [facility] with abdominal pain found to have a subcapsular left renal hematoma post biopsy 9/16 cardiac arrest concerning for acute PE as cause  Interim History / Subjective:  As above  Objective   Blood pressure (!) 139/57, pulse 66, temperature (!) 97.1 F (36.2 C), temperature source Oral, resp. rate (!) 22, height 5\' 2"  (1.575 m), weight 83.4 kg, SpO2 99 %.    Vent Mode: PRVC FiO2 (%):  [40 %-100 %] 40 % Set Rate:  [16 bmp-18 bmp] 18 bmp Vt Set:  [400 mL] 400 mL PEEP:  [5 cmH20] 5 cmH20 Plateau Pressure:  [26 cmH20-28 cmH20] 26 cmH20   Intake/Output Summary (Last 24 hours) at 10/15/2020 0729 Last data filed at 10/15/2020 0601 Gross per 24 hour  Intake 699.09 ml  Output 545 ml  Net 154.09 ml   Filed Weights   10/12/20 1659 10/15/20 0025  Weight: 80.7 kg 83.4 kg    Examination: General: Acute on chronically ill appearing elderly female on mechanical ventilation, in NAD HEENT: ETT, MM pink/moist, PERRL,  Neuro: Sedated on vent  CV: s1s2 regular rate and rhythm, no murmur, rubs, or gallops,  PULM:  Clear to ascultation bilaterally, no increased work of breathing, tolerating vent  GI: soft, bowel sounds hypoactive in all 4 quadrants, non-tender, distended right greater than left Extremities: warm/dry, no edema  Skin: no rashes or lesions  Resolved Hospital Problem list     Assessment & Plan:  Cardiac arrest -Given acute onset with dyspnea prior to and resultant bradycardia PE is high suspicion for cause of cardiac arrest.  Of note patient's D-dimer is elevated at 4.14 etiology.  Given CKD stage IV decision made to not pursue CTA for confirmation of PE at this time Circulatory shock P: Normothermia protocol Continue pressor support through central access MAP goal greater than 65 To trend troponins and lactic acid Cardiology following Heparin drip  initially initiated at [facility] given sudden capillary hematoma and downtrending hemoglobin decision made to hold  Respiratory insufficiency post cardiac arrest -Postarrest patient had spontaneous observed respirations but mentation significantly diminished resulting in intubation for airway protection P: Continue ventilator support with lung protective strategies  Wean PEEP and FiO2 for sats greater than 90%. Head of bed elevated 30 degrees. Plateau pressures less than 30 cm H20.  Follow intermittent chest x-ray and ABG.   Hold SBT until off NMB. Ensure adequate pulmonary hygiene  Follow cultures  VAP bundle in place  PAD protocol  Shock  -likely multifactorial including possible sepsis and/or cardiogenic shock P: Transfer to [facility] for higher level of care Vent support as above Pan cultures Continue broad-spectrum antibiotics Continue pressors for map goal greater than 65 as above Trend lactic acid Check procalcitonin Monitor urine output  At risk for anoxic encephalopathy. -Per chart review it appears arrest was brief but mentation was diminished upon achieving ROSC P: Minimize sedation as able Maintain neuro protective measures; goal for eurothermia, euglycemia, eunatermia, normoxia, and PCO2 goal of 35-40 Nutrition and bowel regiment  Seizure precautions  Aspirations precautions   History of HOCM -Managed with Cardizem and Lopressor at baseline Chest pain/elevated troponin -Troponin with flat trend and peak of 123 per cardiology patient would not be a candidate for invasive evaluation given her anemia and advanced CKD History of hypertension History of hyperlipidemia P: Cardiology following, appreciate assistance Continuous telemetry Hold antihypertensives given hypotension and need for vasopressors currently Follow echocardiogram Trend troponins Strict intake and output  AKI superimposed on on chronic CKD stage IV -Baseline creatinine 2.92 approximately  2 months ago with current creatinine 5.56, BUN 101, GFR 8 Recent renal biopsy with resultant subcapsular area left renal hematoma -IR consulted with no plans for embolization at that time Hyponatremia, chronic Metabolic acidosis P: Nephrology following, appreciate assistance Aggressive IV Lasix per nephrology Sodium bicarb Started 9/17, transition to IV bicarb drip Follow renal function / urine output Trend Bmet Avoid nephrotoxins Ensure adequate renal perfusion     Type 2 diabetes, uncontrolled -Home medications include a 70/30 Novolin P: Continue SSI CBG every 4 hours  Anemia of CKD coupled with acute blood loss anemia P: Trend CBC Transfuse per protocol Hemoglobin goal greater than 7  Hypoalbuminemia with mild protein calorie malnutrition P: Start tube feeds Supplement protein  Best Practice    Diet/type: tubefeeds DVT prophylaxis: SCD GI prophylaxis: PPI Lines: Central line Foley:  Yes, and it is still needed Code Status:  full code Last date of multidisciplinary goals of care discussion pending  Labs   CBC: Recent Labs  Lab 10/10/2020 1738 10/12/20 0422 10/13/20 0205 10/13/20 8841 10/14/20 0505 10/14/20 2323 10/15/20 0253  WBC 15.4*   < > 17.0* 17.6* 13.4* 18.3* 21.9*  NEUTROABS 12.7*  --   --   --   --   --   --   HGB 11.8*   < > 9.5* 9.5* 8.5* 7.8* 8.5*  HCT 35.7*   < > 28.6* 28.9* 25.9* 25.2* 25.7*  MCV 88.1   < > 88.0 88.1 89.6 93.3 88.3  PLT 270   < > 214 220 199 228 245   < > = values in this interval  not displayed.    Basic Metabolic Panel: Recent Labs  Lab 10/12/20 0422 10/13/20 0205 10/13/20 0814 10/14/20 0505 10/14/20 2322 10/15/20 0253  NA 126* 123* 124* 126* 124* 126*  K 4.5 4.9 5.0 4.9 5.5* 5.1  CL 91* 95* 94* 94* 90* 90*  CO2 23 20* 20* 19* 18* 23  GLUCOSE 54* 190* 161* 175* 265* 172*  BUN 94* 94* 97* 85* 102* 101*  CREATININE 4.46* 4.32* 4.66* 5.25* 5.32* 5.56*  CALCIUM 9.1 8.3* 8.7* 8.4* 7.8* 8.0*  MG 1.8  --   --   --   2.4  --   PHOS 6.2*  --  6.8*  --   --   --    GFR: Estimated Creatinine Clearance: 9.4 mL/min (A) (by C-G formula based on SCr of 5.56 mg/dL (H)). Recent Labs  Lab 10/12/20 0422 10/12/20 1122 10/13/20 0814 10/14/20 0505 10/14/20 2322 10/14/20 2323 10/15/20 0253  PROCALCITON 0.34  --  0.65  --   --   --   --   WBC 14.7*   < > 17.6* 13.4*  --  18.3* 21.9*  LATICACIDVEN  --   --  1.0  --  6.1*  --  1.9   < > = values in this interval not displayed.    Liver Function Tests: Recent Labs  Lab 10/03/2020 1738 10/12/20 0422 10/13/20 0814 10/14/20 2322 10/15/20 0253  AST 39 24  --  133* 183*  ALT 43 34  --  101* 121*  ALKPHOS 82 76  --  83 97  BILITOT 0.4 0.5  --  0.7 1.2  PROT 7.0 6.4*  --  6.3* 6.5  ALBUMIN 3.3* 3.1* 2.8* 3.2* 3.3*   Recent Labs  Lab 10/12/2020 1738  LIPASE 37   No results for input(s): AMMONIA in the last 168 hours.  ABG    Component Value Date/Time   PHART 7.238 (L) 10/15/2020 0038   PCO2ART 42.4 10/15/2020 0038   PO2ART 167 (H) 10/15/2020 0038   HCO3 17.2 (L) 10/15/2020 0038   TCO2 36 03/04/2012 1749   ACIDBASEDEF 8.6 (H) 10/15/2020 0038   O2SAT 98.4 10/15/2020 0038     Coagulation Profile: Recent Labs  Lab 10/12/20 0422 10/15/20 0253  INR 1.1 1.2    Cardiac Enzymes: No results for input(s): CKTOTAL, CKMB, CKMBINDEX, TROPONINI in the last 168 hours.  HbA1C: Hemoglobin A1C  Date/Time Value Ref Range Status  09/29/2020 02:49 PM 7.2 (A) 4.0 - 5.6 % Final  06/29/2020 10:23 AM 8.9 (A) 4.0 - 5.6 % Final   Hgb A1c MFr Bld  Date/Time Value Ref Range Status  09/22/2020 01:13 PM 7.6 (H) 4.8 - 5.6 % Final    Comment:    (NOTE) Pre diabetes:          5.7%-6.4%  Diabetes:              >6.4%  Glycemic control for   <7.0% adults with diabetes   03/17/2020 04:24 PM 7.1 (H) 4.8 - 5.6 % Final    Comment:             Prediabetes: 5.7 - 6.4          Diabetes: >6.4          Glycemic control for adults with diabetes: <7.0     CBG: Recent  Labs  Lab 10/14/20 0806 10/14/20 1115 10/14/20 1632 10/14/20 2103 10/14/20 2314  GLUCAP 163* 180* 144* 186* 199*    Review of Systems:   Unable  to assess secondary to intubation and sedation  Past Medical History:  She,  has a past medical history of Anemia, Arthritis, CKD (chronic kidney disease) stage 3, GFR 30-59 ml/min ([place]), Diabetes mellitus, type 2 ([place]), Essential hypertension, GERD (gastroesophageal reflux disease), Gout, History of MRSA infection (03/2009), History of stroke (2013), HOCM (hypertrophic obstructive cardiomyopathy) ([place]), Hypercalcemia (2017), Hyperlipidemia, Obesity, Psoriasis, Uterine cancer ([place]), and 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   Surgical History:   Past Surgical History:  Procedure Laterality Date   ABDOMINAL HYSTERECTOMY     APPENDECTOMY  2012   COLONOSCOPY WITH PROPOFOL N/A 08/24/2016   Procedure: COLONOSCOPY WITH PROPOFOL;  Surgeon: Rogene Houston, MD;  Location: [facility];  Service: Endoscopy;  Laterality: N/A;  10:30   Excison of right breast cyst     LAPAROSCOPIC SALPINGO OOPHERECTOMY Right 04/22/2012   Procedure: LAPAROSCOPIC SALPINGO OOPHORECTOMY;  Surgeon: Jonnie Kind, MD;  Location: [facility] ORS;  Service: Gynecology;  Laterality: Right;  end 11:17   MASS EXCISION N/A 04/22/2012   Procedure: EXCISION SKIN TAGS NECK AND HEAD;  Surgeon: Jonnie Kind, MD;  Location: [facility] ORS;  Service: Gynecology;  Laterality: N/A;  start 11:19   PARTIAL HYSTERECTOMY     POLYPECTOMY  08/24/2016   Procedure: POLYPECTOMY;  Surgeon: Rogene Houston, MD;  Location: [facility];  Service: Endoscopy;;  colon   Right neck biopsy       Social History:   reports that she quit smoking about 42 years ago. She has a 3.00 pack-year smoking history. She has never used smokeless tobacco. She reports that she does not drink alcohol and does not use drugs.   Family History:  Her family history includes Gout in her brother, brother, sister, and sister;  Hypertension in her brother, brother, and sister; Leukemia (age of onset: 25) in her sister; Pancreatic cancer (age of onset: 93) in her sister; Prostate cancer in her brother, brother, and brother. There is no history of Diabetes.   Allergies Allergies  Allergen Reactions   Benazepril Swelling   Metronidazole Hives   Mobic [Meloxicam] Hives   Penicillins Hives and Swelling   Sulfonamide Derivatives Hives     Home Medications  Prior to Admission medications   Medication Sig Start Date End Date Taking? Authorizing Provider  aspirin EC 81 MG tablet Take 1 tablet (81 mg total) by mouth daily. Swallow whole. Patient taking differently: Take 325 mg by mouth daily. Swallow whole. 09/13/20  Yes Satira Sark, MD  calcitRIOL (ROCALTROL) 0.25 MCG capsule Take 0.25 mcg by mouth 3 (three) times a week.   Yes [provider]  cyclobenzaprine (FLEXERIL) 10 MG tablet TAKE 1 TABLET BY MOUTH  EVERY NIGHT AT  BEDTIME AS NEEDED FOR SPASM Patient taking differently: Take 10 mg by mouth at bedtime as needed for muscle spasms. 08/15/20  Yes Sanjuana Kava, MD  DILT-XR 180 MG 24 hr capsule Take 1 capsule by mouth once daily Patient taking differently: Take 180 mg by mouth daily. 09/05/20  Yes Fayrene Helper, MD  epoetin alfa (EPOGEN) 3000 UNIT/ML injection Inject 3,000 Units into the vein every 14 (fourteen) days.    Yes Bhutani, Manpreet S, MD  ezetimibe (ZETIA) 10 MG tablet Take 1 tablet (10 mg total) by mouth daily. 11/12/19  Yes Fayrene Helper, MD  febuxostat (ULORIC) 40 MG tablet One daily for gout Patient taking differently: Take 40 mg by mouth daily. One daily for gout 06/14/20  Yes Sanjuana Kava, MD  gabapentin (NEURONTIN) 600 MG tablet Take 300 mg by mouth 2 (two) times daily. 03/21/20  Yes [provider]  hydrALAZINE (APRESOLINE) 25 MG tablet TAKE 1 TABLET BY MOUTH TWICE DAILY (AT  9:00  AM  AND  9:00  PM) Patient taking differently: Take 25 mg by mouth in the morning and  at bedtime. 08/15/20  Yes Fayrene Helper, MD  HYDROcodone-acetaminophen (NORCO/VICODIN) 5-325 MG tablet One tablet every six hours for pain.  Limit 7 days. Patient taking differently: Take 1 tablet by mouth every 6 (six) hours as needed for severe pain. Limit 7 days. 09/13/20  Yes Sanjuana Kava, MD  insulin NPH-regular Human (NOVOLIN 70/30 RELION) (70-30) 100 UNIT/ML injection Inject 105 Units into the skin daily with breakfast. 06/29/20  Yes Renato Shin, MD  metoprolol tartrate (LOPRESSOR) 100 MG tablet Take 1 tablet by mouth twice daily Patient taking differently: Take 50 mg by mouth 2 (two) times daily. 05/10/20  Yes Fayrene Helper, MD  potassium chloride (KLOR-CON) 10 MEQ tablet Take 30 mEq by mouth 2 (two) times daily.   Yes [provider]  pravastatin (PRAVACHOL) 20 MG tablet TAKE 1 TABLET BY MOUTH ONCE DAILY BEFORE BREAKFAST Patient taking differently: Take 20 mg by mouth daily. 08/08/20  Yes Fayrene Helper, MD  prednisoLONE acetate (PRED FORTE) 1 % ophthalmic suspension Place 1 drop into the left eye in the morning and at bedtime. 05/20/20  Yes [provider]  torsemide (DEMADEX) 20 MG tablet Take 2.5 tablets (50 mg total) by mouth 2 (two) times daily. 09/13/20  Yes Satira Sark, MD  metolazone (ZAROXOLYN) 5 MG tablet Take 1 tablet by mouth once daily Patient taking differently: Take 5 mg by mouth daily. 08/15/20   Fayrene Helper, MD  [facility] ULTRA test strip USE 1 STRIP TO CHECK GLUCOSE TWICE DAILY 06/20/20   Renato Shin, MD  spironolactone (ALDACTONE) 25 MG tablet Take 75 mg by mouth daily. 06/23/20 06/23/21  [provider]     Critical care time:   Performed by: Dametrius Sanjuan D. Harris  Total critical care time: 50 minutes  Critical care time was exclusive of separately billable procedures and treating other patients.  Critical care was necessary to treat or prevent imminent or life-threatening deterioration.  Critical care was time  spent personally by me on the following activities: development of treatment plan with patient and/or surrogate as well as nursing, discussions with consultants, evaluation of patient's response to treatment, examination of patient, obtaining history from patient or surrogate, ordering and performing treatments and interventions, ordering and review of laboratory studies, ordering and review of radiographic studies, pulse oximetry and re-evaluation of patient's condition.  Jakera Beaupre D. Kenton Kingfisher, NP-C [facility] Personal contact information can be found on Amion  10/15/2020, 7:57 AM

## 2020-10-15 NOTE — Progress Notes (Signed)
VASCULAR LAB    Bilateral lower extremity venous duplex has been performed.  See CV proc for preliminary results.   Friedrich Harriott, RVT 10/15/2020, 4:35 PM

## 2020-10-15 NOTE — Progress Notes (Signed)
  Echocardiogram 2D Echocardiogram has been performed.  Krista Strickland 10/15/2020, 3:37 PM

## 2020-10-15 NOTE — Progress Notes (Signed)
Pharmacy Antibiotic Note  Krista Strickland is a 70 y.o. female admitted on 09/29/2020 with sepsis.  Pharmacy has been consulted for Vancomycin and Cefepime dosing. Pt with CKD stage 5, Scr now up to 5.35, est CrCl < 10 ml/min  Plan: Cefepime 1gm IV q24h Vancomycin 1500 mg IV now  Consider random vanc level in ~48 hrs Will f/u renal function, micro data, and pt's clinical condition   Height: 5\' 2"  (157.5 cm) Weight: 83.4 kg (183 lb 13.8 oz) IBW/kg (Calculated) : 50.1  Temp (24hrs), Avg:98.4 F (36.9 C), Min:98.3 F (36.8 C), Max:98.5 F (36.9 C)  Recent Labs  Lab 10/12/20 0422 10/12/20 1122 10/13/20 0205 10/13/20 0814 10/14/20 0505 10/14/20 2322 10/14/20 2323 10/15/20 0253  WBC 14.7*   < > 17.0* 17.6* 13.4*  --  18.3* 21.9*  CREATININE 4.46*  --  4.32* 4.66* 5.25* 5.32*  --   --   LATICACIDVEN  --   --   --  1.0  --  6.1*  --  1.9   < > = values in this interval not displayed.    Estimated Creatinine Clearance: 9.8 mL/min (A) (by C-G formula based on SCr of 5.32 mg/dL (H)).    Allergies  Allergen Reactions   Benazepril Swelling   Metronidazole Hives   Mobic [Meloxicam] Hives   Penicillins Hives and Swelling   Sulfonamide Derivatives Hives    Antimicrobials this admission: 9/17 Vanc >>  9/17 Cefepime >>   Microbiology results:  BCx:   UCx:   9/14 MRSA PCR: negative  Thank you for allowing pharmacy to be a part of this patient's care.  Sherlon Handing, PharmD, BCPS Please see amion for complete clinical pharmacist phone list 10/15/2020 3:25 AM

## 2020-10-15 NOTE — Progress Notes (Signed)
PROGRESS NOTE    Patient: Krista Strickland                            PCP: Fayrene Helper, MD                    DOB: [birth year]            DOA: 10/24/2020 SEG:[id number]             DOS: 10/15/2020, 10:06 AM   LOS: 3 days   Date of Service: The patient was seen and examined on 10/15/2020  Subjective:   Overnight patient became unresponsive... Apparently she ambulated to the bathroom and back when she became unresponsive per records lost pulse, CODE BLUE was called Post ACLS/CPR ROSC was achieved but patient remained unresponsive subsequently decision was made to secure the airway.  Patient was intubated successfully. Central line was placed by ED doc Patient was noted for hypotension, started on pressors  Labs and findings were consistent with septic, septic shock patient was started on pressors, broad-spectrum antibiotics..  Patient has been stabilized, report was made to ICU team at [facility].  Discussed the finding and plan of care with family member this morning on the phone agreed to transfer the patient to ICU at [facility].  Patient remain full code   Brief Narrative:   Krista Strickland is a 70 y.o. female with medical history significant for CKD stage IV, hypertension, type 2 diabetes mellitus, GERD, hyperlipidemia, HOCM who presents to the emergency department due to abdominal pain which started around 1 PM today.  Pain was sharp in nature, it was constant and was rated as 9/10 on pain scale.  Pain was located left lower quadrant.  She endorsed one loose bowel movement today and was not sure if this was related to eating a larger than usual breakfast around 11 AM this morning.  She recently had renal biopsy on 9/9.  She follows with Dr. Theador Hawthorne due to CKD and was last seen on 8/18.   ED: 171/89, mild leukocytosis, hyponatremia,, BUN to creatinine 93/4.48, hyperglycemia, albumin 3.3.  Influenza A, B, SARS coronavirus 2 was negative.   CT abdomen and pelvis without  contrast showed acute 8 x 4 x 12 cm left renal subcapsular hematoma.  Interventional radiologist was consulted and recommended monitoring patient for 24 to 48 hours    Assessment & Plan:   Principal Problem:   Abdominal pain Active Problems:   Mixed hyperlipidemia   Obesity (BMI 30.0-34.9)   Essential hypertension   Anemia in chronic kidney disease   Renal hematoma, left, initial encounter   Hyponatremia   Hypoalbuminemia due to protein-calorie malnutrition (HCC)   Hyperglycemia due to diabetes mellitus ([place])   Acute kidney injury superimposed on CKD (HCC)   Leukocytosis   Acute altered mental status, patient coded overnight  Status post CODE 4: Code 4 was called overnight, airway was secured intubated, on the vent, pressors were initiated, central line was placed by ED doc.  Acute respiratory failure,  patient will remain intubated on the vent-sedated PCCM consulted, appreciate management  Patient will be transferred to ICU to [facility]   Septic shock -Altered mental status, hypotension, leukocytosis, lactic acidosis -Patient initiated on IV fluids, pressors, broad-spectrum antibiotics Likely source of infection, possible UTI  chest pain  -With elevated troponin 100, 123, 122 -Likely ischemic demand due to accelerated hypertension -Currently chest pain-free -Cardiologist consulted appreciate  recommendations -2D echocardiogram -reviewed no acute findings -Continue to monitor closely  Abdominal pain -Likely related to left renal subscapular hematoma -Improving, tolerable with narcotics -CT abdomen and pelvis without contrast showed scattered colonic diverticulosis with no acute diverticulitis..  Also revealing left renal subscapular hematoma - Continue IV NS at 75 mLs/Hr Continue IV Dilaudid 0.5 mg q.4h p.r.n. for moderate to severe pain Continue IV Zofran p.r.n. Procalcitonin will be down to rule out infectious process    Left renal subscapular  hematoma -Monitoring H&H, dropping slowly -No intervention recommended at this time  CT abdomen and pelvis without contrast showed acute 8 x 4 x 12 cm left renal subcapsular hematoma.  Discussed with nephrologist Dr. Othelia Pulling and discussed the case with interventional radiologist Dr. Hassell-recommended close monitoring, no need for transfer to [facility] for possible embolization at this time. -Monitoring H&H-stable -Continue to BladderScan, mild retention, negative for hematuria or obstruction or clot -We will monitor for urinary retention, hematuria   Acute On CKD stage V/dehydration BUN to creatinine 93/4.48 (baseline creatinine at 2.7 x 3.7) >> 94/4.32,>>> 5.25, 5.56 Continue IV hydration, bicarb PO was added  Renally adjust medications, avoid nephrotoxic agents/dehydration/hypotension  -Nephrology following -  -Getting worse likely will patient need hemodialysis soon   Hyponatremia (chronic) This may be due to diuretic use..  Serum sodium 127, 126, 127, 126  -Continue gentle IV fluid hydration, bicarb, Continue to monitor sodium with serial BMPs Urine and serum osmolality and urine sodium will be checked  Hyperglycemia secondary to T2DM Continue ISS and hypoglycemic protocol -Monitoring CBG closely  Anemia of chronic disease Hemoglobin at 11.8 (baseline at 12.2-13.4) >>10.1, 9.5>> 8.5  This is possibly secondary to patient's history of CKD Monitoring H&H every 8 hours  Essential hypertension (uncontrolled) -accelerated hypertension Continue Lopressor, diltiazem, Lasix, hydralazine, Currently recommended continue hydralazine, Cardizem, switching labetalol back to blood pressure increased dose 200 twice daily, hydralazine increased to 75 twice daily -Stable, as needed IV hydralazine  Hyperlipidemia Continue Zetia and Pravachol -Stable  Hypoalbuminemia secondary to mild protein calorie malnutrition Protein supplement will be provided -IV albumin initiated per  nephrology  Obesity (BMI; 31.7) Patient counseled on diet lifestyle modification     Consultants:  IR/ Nephrologist    --------------------------------------------------------------------------------------------------------------------------------  DVT prophylaxis:  SCD Code Status:   Code Status: Full Code  Family Communication: Sister at bedside  The above findings and plan of care has been discussed with patient (and family)  in detail,  they expressed understanding and agreement of above. -Advance care planning has been discussed.   Admission status:   Status is: Observation  The patient remains OBS appropriate and will d/c before 2 midnights.  Dispo: The patient is from: Home              Anticipated d/c is to: Home              Patient currently is not medically stable to d/c.   Difficult to place patient No   Level of care: ICU   Procedures:   No admission procedures for hospital encounter.    Antimicrobials:  Anti-infectives (From admission, onward)    Start     Dose/Rate Route Frequency Ordered Stop   10/15/20 0815  ciprofloxacin (CIPRO) IVPB 400 mg  Status:  Discontinued        400 mg 200 mL/hr over 60 Minutes Intravenous Every 12 hours 10/15/20 0716 10/15/20 0717   10/15/20 0415  ceFEPIme (MAXIPIME) 1 g in sodium chloride 0.9 % 100 mL  IVPB        1 g 200 mL/hr over 30 Minutes Intravenous Daily 10/15/20 0329     10/15/20 0415  vancomycin (VANCOREADY) IVPB 1500 mg/300 mL        1,500 mg 150 mL/hr over 120 Minutes Intravenous  Once 10/15/20 0329 10/15/20 0848   10/15/20 0400  aztreonam (AZACTAM) 2 g in sodium chloride 0.9 % 100 mL IVPB  Status:  Discontinued        2 g 200 mL/hr over 30 Minutes Intravenous  Once 10/15/20 0313 10/15/20 0329   10/15/20 0400  vancomycin (VANCOCIN) IVPB 1000 mg/200 mL premix  Status:  Discontinued        1,000 mg 200 mL/hr over 60 Minutes Intravenous  Once 10/15/20 0313 10/15/20 0329   10/15/20 0329  vancomycin variable  dose per unstable renal function (pharmacist dosing)         Does not apply See admin instructions 10/15/20 0329          Medication:   calcitRIOL  0.25 mcg Oral Daily   Chlorhexidine Gluconate Cloth  6 each Topical Daily   diltiazem  180 mg Oral Daily   ezetimibe  10 mg Oral Daily   febuxostat  40 mg Oral Daily   feeding supplement (GLUCERNA SHAKE)  237 mL Oral TID BM   furosemide  80 mg Intravenous Q12H   gabapentin  200 mg Oral BID   hydrALAZINE  50 mg Oral Q8H   influenza vaccine adjuvanted  0.5 mL Intramuscular Tomorrow-1000   insulin aspart  0-5 Units Subcutaneous QHS   insulin aspart  0-6 Units Subcutaneous TID WC   insulin aspart protamine- aspart  50 Units Subcutaneous Q breakfast   metoprolol tartrate  75 mg Oral BID   pravastatin  20 mg Oral QAC breakfast   sodium bicarbonate  650 mg Oral TID   vancomycin variable dose per unstable renal function (pharmacist dosing)   Does not apply See admin instructions    albuterol, hydrALAZINE, HYDROcodone-acetaminophen, HYDROmorphone (DILAUDID) injection, ondansetron (ZOFRAN) IV   Objective:   Vitals:   10/15/20 0600 10/15/20 0700 10/15/20 0800 10/15/20 0900  BP: (!) 147/58 (!) 139/57 (!) 148/58 (!) 148/61  Pulse: 67 66 64 66  Resp: 15 (!) 22 16 16   Temp:    98.3 F (36.8 C)  TempSrc:    Axillary  SpO2: 98% 99% 99% 99%  Weight:      Height:        Intake/Output Summary (Last 24 hours) at 10/15/2020 1006 Last data filed at 10/15/2020 0865 Gross per 24 hour  Intake 1069.41 ml  Output 545 ml  Net 524.41 ml   Filed Weights   10/12/20 1659 10/15/20 0025  Weight: 80.7 kg 83.4 kg     Examination:       Physical Exam:   General:  Sedated intubated  HEENT:  ET tube in place, on the vent-normocephalic,   Neuro:  Exam-patient is sedated  Lungs:   ET tube in place, bilateral lung field air sounds, if any wheezes or crackles  c  Cardio:    S1/S2, RRR, No murmure, No Rubs or Gallops   Abdomen:   Soft, not  distended, bowel sounds active all four quadrants,  no guarding or peritoneal signs.  Muscular skeletal:  Limited exam -sedated, intubated,  2+ pulses,  symmetric, No pitting edema  Skin:  Dry, warm to touch, negative for any Rashes,  Wounds: Please see nursing documentation                -----------------------------------------------------------------------------------------------------------------------------  LABs:  CBC Latest Ref Rng & Units 10/15/2020 10/14/2020 10/14/2020  WBC 4.0 - 10.5 K/uL 21.9(H) 18.3(H) 13.4(H)  Hemoglobin 12.0 - 15.0 g/dL 8.5(L) 7.8(L) 8.5(L)  Hematocrit 36.0 - 46.0 % 25.7(L) 25.2(L) 25.9(L)  Platelets 150 - 400 K/uL 245 228 199   CMP Latest Ref Rng & Units 10/15/2020 10/14/2020 10/14/2020  Glucose 70 - 99 mg/dL 172(H) 265(H) 175(H)  BUN 8 - 23 mg/dL 101(H) 102(H) 85(H)  Creatinine 0.44 - 1.00 mg/dL 5.56(H) 5.32(H) 5.25(H)  Sodium 135 - 145 mmol/L 126(L) 124(L) 126(L)  Potassium 3.5 - 5.1 mmol/L 5.1 5.5(H) 4.9  Chloride 98 - 111 mmol/L 90(L) 90(L) 94(L)  CO2 22 - 32 mmol/L 23 18(L) 19(L)  Calcium 8.9 - 10.3 mg/dL 8.0(L) 7.8(L) 8.4(L)  Total Protein 6.5 - 8.1 g/dL 6.5 6.3(L) -  Total Bilirubin 0.3 - 1.2 mg/dL 1.2 0.7 -  Alkaline Phos 38 - 126 U/L 97 83 -  AST 15 - 41 U/L 183(H) 133(H) -  ALT 0 - 44 U/L 121(H) 101(H) -       Micro Results Recent Results (from the past 240 hour(s))  Resp Panel by RT-PCR (Flu A&B, Covid) Nasopharyngeal Swab     Status: None   Collection Time: 10/19/2020  9:07 PM   Specimen: Nasopharyngeal Swab; Nasopharyngeal(NP) swabs in vial transport medium  Result Value Ref Range Status   SARS Coronavirus 2 by RT PCR NEGATIVE NEGATIVE Final    Comment: (NOTE) SARS-CoV-2 target nucleic acids are NOT DETECTED.  The SARS-CoV-2 RNA is generally detectable in upper respiratory specimens during the acute phase of infection. The lowest concentration of SARS-CoV-2 viral copies this assay can detect is 138 copies/mL. A negative  result does not preclude SARS-Cov-2 infection and should not be used as the sole basis for treatment or other patient management decisions. A negative result may occur with  improper specimen collection/handling, submission of specimen other than nasopharyngeal swab, presence of viral mutation(s) within the areas targeted by this assay, and inadequate number of viral copies(<138 copies/mL). A negative result must be combined with clinical observations, patient history, and epidemiological information. The expected result is Negative.  Fact Sheet for Patients:  EntrepreneurPulse.com.au  Fact Sheet for Healthcare Providers:  IncredibleEmployment.be  This test is no t yet approved or cleared by the [place] FDA and  has been authorized for detection and/or diagnosis of SARS-CoV-2 by FDA under an Emergency Use Authorization (EUA). This EUA will remain  in effect (meaning this test can be used) for the duration of the COVID-19 declaration under Section 564(b)(1) of the Act, 21 U.S.C.section 360bbb-3(b)(1), unless the authorization is terminated  or revoked sooner.       Influenza A by PCR NEGATIVE NEGATIVE Final   Influenza B by PCR NEGATIVE NEGATIVE Final    Comment: (NOTE) The Xpert Xpress SARS-CoV-2/FLU/RSV plus assay is intended as an aid in the diagnosis of influenza from Nasopharyngeal swab specimens and should not be used as a sole basis for treatment. Nasal washings and aspirates are unacceptable for Xpert Xpress SARS-CoV-2/FLU/RSV testing.  Fact Sheet for Patients: EntrepreneurPulse.com.au  Fact Sheet for Healthcare Providers: IncredibleEmployment.be  This test is not yet approved or cleared by the [place] FDA and has been authorized for detection and/or diagnosis of SARS-CoV-2 by FDA under an Emergency Use Authorization (EUA). This EUA will remain in effect (meaning this test can be used)  for the duration of the COVID-19 declaration under Section 564(b)(1) of the Act, 21 U.S.C. section 360bbb-3(b)(1), unless the  authorization is terminated or revoked.  Performed at [facility], [address] [place], [place] [address]   MRSA Next Gen by PCR, Nasal     Status: None   Collection Time: 10/12/20  5:00 PM   Specimen: Nasal Mucosa; Nasal Swab  Result Value Ref Range Status   MRSA by PCR Next Gen NOT DETECTED NOT DETECTED Final    Comment: (NOTE) The GeneXpert MRSA Assay (FDA approved for NASAL specimens only), is one component of a comprehensive MRSA colonization surveillance program. It is not intended to diagnose MRSA infection nor to guide or monitor treatment for MRSA infections. Test performance is not FDA approved in patients less than 30 years old. Performed at [facility], [address], [place], [place] [address]   Culture, blood (x 2)     Status: None (Preliminary result)   Collection Time: 10/15/20  5:02 AM   Specimen: BLOOD  Result Value Ref Range Status   Specimen Description BLOOD  Final   Special Requests NONE  Final   Culture   Final    NO GROWTH < 12 HOURS Performed at [facility], [address] [place], [place] [address]    Report Status PENDING  Incomplete  Culture, blood (x 2)     Status: None (Preliminary result)   Collection Time: 10/15/20  5:13 AM   Specimen: BLOOD  Result Value Ref Range Status   Specimen Description BLOOD  Final   Special Requests NONE  Final   Culture   Final    NO GROWTH < 12 HOURS Performed at [facility], [address], [place], [place] [address]    Report Status PENDING  Incomplete    Radiology Reports CT Abdomen Pelvis Wo Contrast  Result Date: 10/26/2020 CLINICAL DATA:  abdominal pain that began today Pt had renal biopsy 3 days ago EXAM: CT ABDOMEN AND PELVIS WITHOUT CONTRAST TECHNIQUE: Multidetector CT imaging of the abdomen and pelvis was performed following the standard protocol without IV contrast.  COMPARISON:  CT lumbar spine 09/23/2019, CT abdomen pelvis 07/22/2019 FINDINGS: Lower chest: Redemonstration of calcified right lower lobe pulmonary nodules (4:14, 8). Similar finding on the left in the lower lobe (4:4). Hepatobiliary: No focal liver abnormality. No gallstones, gallbladder wall thickening, or pericholecystic fluid. No biliary dilatation. Pancreas: No focal lesion. Normal pancreatic contour. No surrounding inflammatory changes. No main pancreatic ductal dilatation. Spleen: Normal in size without focal abnormality. Adrenals/Urinary Tract: No adrenal nodule bilaterally. Left perinephric fat stranding. There is a hyperdense 8 x 4 x 12cm subcapsular hematoma along the left kidney leading to mass effect on the renal parenchyma. No nephrolithiasis and no hydronephrosis. No definite contour-deforming renal mass. No ureterolithiasis or hydroureter. The urinary bladder is unremarkable. Stomach/Bowel: PO contrast reaches the transverse colon. Stomach is within normal limits. No evidence of bowel wall thickening or dilatation. Diffuse sigmoid diverticulosis. Appendix appears normal. Vascular/Lymphatic: No abdominal aorta or iliac aneurysm. Severe atherosclerotic plaque of the aorta and its branches. No abdominal, pelvic, or inguinal lymphadenopathy. Reproductive: There is a 2.8 cm right adnexal cystic lesion. The uterus is not well visualized and likely surgically removed. The left adnexal region is unremarkable. Other: Hyperdense free fluid noted coursing into the pelvis no intraperitoneal free fluid. No intraperitoneal free gas. No organized fluid collection. Musculoskeletal: Small fat containing umbilical hernia. No suspicious lytic or blastic osseous lesions. No acute displaced fracture. Multilevel degenerative changes of the spine. Grade 1 anterolisthesis of L4 on L5. IMPRESSION: 1. Acute 8 x 4 x12 cm left  renal subcapsular hematoma. 2. Scattered colonic diverticulosis with no acute diverticulitis. 3.  Stable bilateral lower lobe calcified pulmonary nodules. 4. A 2.8 cm right adnexal cystic lesion. No follow-up imaging recommended. Note: This recommendation does not apply to premenarchal patients and to those with increased risk (genetic, family history, elevated tumor markers or other high-risk factors) of ovarian cancer. Reference: JACR 2020 Feb; 17(2):248-254. 5.  Aortic Atherosclerosis (ICD10-I70.0). These results were called by telephone at the time of interpretation on 09/30/2020 at 8:42 pm to provider [facility] , who verbally acknowledged these results. Electronically Signed   By: Iven Finn M.D.   On: 10/28/2020 20:53   DG Abd 1 View  Result Date: 10/15/2020 CLINICAL DATA:  Central line placement EXAM: ABDOMEN - 1 VIEW COMPARISON:  Abdominal CT from 4 days ago FINDINGS: Right femoral line with tip at the level of the right sacral ala. Based on atheromatous calcification the tip is likely separate from the external iliac artery. No acute osseous or bowel findings. Partially covered enteric tube looping in the stomach. IMPRESSION: Unremarkable right femoral line. Electronically Signed   By: Jorje Guild M.D.   On: 10/15/2020 04:30   US RENAL  Result Date: 10/12/2020 CLINICAL DATA:  AK I on CKD, history of left kidney biopsy 10/07/2020 EXAM: RENAL / URINARY TRACT ULTRASOUND COMPLETE COMPARISON:  CT abdomen/pelvis 1 day prior FINDINGS: Right Kidney: Renal measurements: 10.4 cm x 5.4 cm x 5.3 cm = volume: 155 mL. Echogenicity within normal limits. No mass or hydronephrosis visualized. Left Kidney: Renal measurements: 12.8 cm x 6.2 cm x 6.0 cm = volume: 249 mL. There is a subcapsular hematoma along the left kidney measuring approximately 10.6 cm x 2.8 cm x 6.5 cm, as seen on the CT obtained 1 day prior. Bladder: Appears normal for degree of bladder distention. Other: None. IMPRESSION: Subcapsular hematoma along the left kidney, grossly similar in size allowing for difference in modality.  Electronically Signed   By: Valetta Mole M.D.   On: 10/12/2020 11:15   DG CHEST PORT 1 VIEW  Result Date: 10/15/2020 CLINICAL DATA:  Endotracheal tube repositioned EXAM: PORTABLE CHEST 1 VIEW COMPARISON:  10/14/2020 11:33 p.m. FINDINGS: Interval repositioning of the endotracheal tube, which now terminates approximately 3.5 cm above the carina. Enteric tube extends below the inferior field of view. Redemonstrated cardiomegaly and central pulmonary vascular congestion. Redemonstrated interstitial opacities in the perihilar regions. No pleural effusion or pneumothorax. No acute osseous abnormality. IMPRESSION: 1. Interval repositioning of the endotracheal tube, which is now in appropriate position. Otherwise stable support apparatus. 2. Redemonstrated cardiomegaly and pulmonary edema. Electronically Signed   By: Merilyn Baba M.D.   On: 10/15/2020 00:50   DG CHEST PORT 1 VIEW  Result Date: 10/14/2020 CLINICAL DATA:  Code blue.  Post intubation and orogastric tube. EXAM: PORTABLE CHEST 1 VIEW COMPARISON:  Chest x-ray 10/12/2020. FINDINGS: There is right mainstem intubation. Enteric tube extends below the diaphragm. The tip of the enteric tube is not included on the exam. The heart is enlarged. There is increasing central pulmonary vascular congestion. There are increasing interstitial opacities in the perihilar regions. There is a calcified nodule in the right lung base. There is no pleural effusion or pneumothorax. No acute fractures are seen. IMPRESSION: 1. Right mainstem intubation. Recommend retraction of endotracheal tube approximately 4 cm. 2. The enteric tube extends below the diaphragm, but tip is not visualized. 3. Cardiomegaly with increasing pulmonary edema. 4. These results were called by telephone at the time of  interpretation on 10/14/2020 at 11:47 pm to provider ASIA ZIERLE-GHOSH , who verbally acknowledged these results. Electronically Signed   By: Ronney Asters M.D.   On: 10/14/2020 23:47   DG  CHEST PORT 1 VIEW  Result Date: 10/12/2020 CLINICAL DATA:  Chest pain. EXAM: PORTABLE CHEST 1 VIEW COMPARISON:  Chest x-ray 01/06/2004.  Chest x-ray 09/23/2019. FINDINGS: The heart is enlarged, unchanged. There is central pulmonary vascular congestion. There is a new small left pleural effusion with patchy opacities in the left lung base. There is no evidence for pneumothorax. No acute fractures are identified. IMPRESSION: 1.  Cardiomegaly with central pulmonary vascular congestion. 2. New small left pleural effusion with left basilar atelectasis/airspace disease. Electronically Signed   By: Ronney Asters M.D.   On: 10/12/2020 23:56   ECHOCARDIOGRAM COMPLETE  Result Date: 10/14/2020    ECHOCARDIOGRAM REPORT   Patient Name:   KENYONA RENA Date of Exam: 10/13/2020 Medical Rec #:  [id number]        Height:       62.0 in Accession #:    [id number]       Weight:       177.9 lb Date of Birth:  [birth year]        BSA:          1.819 m Patient Age:    10 years         BP:           186/53 mmHg Patient Gender: F                HR:           68 bpm. Exam Location:  [facility] Procedure: 2D Echo, Cardiac Doppler and Color Doppler Indications:    Hypertrophic cardiomyopathy  History:        Patient has prior history of Echocardiogram examinations, most                 recent 09/18/2019. Stroke; Risk Factors:Hypertension, Diabetes                 and Dyslipidemia. Hypertrophic cardiomyopathy.  Sonographer:    Wenda Low Referring Phys: Erma Heritage IMPRESSIONS  1. Left ventricular ejection fraction, by estimation, is 70 to 75%. The left ventricle has hyperdynamic function. The left ventricle has no regional wall motion abnormalities. There is severe concentric left ventricular hypertrophy. Left ventricular diastolic parameters are indeterminate. Elevated left ventricular end-diastolic pressure. Resting LVOT gradient of 36 mmHg.  2. Right ventricular systolic function is normal. The right ventricular size is  normal. Tricuspid regurgitation signal is inadequate for assessing PA pressure.  3. Left atrial size was moderately dilated.  4. Left pleural effusion is present.  5. The mitral valve is abnormal. Mild to moderate mitral valve regurgitation. Severe mitral annular calcification.  6. The aortic valve is tricuspid. There is moderate calcification of the aortic valve. Aortic valve regurgitation is not visualized. Mild to moderate aortic valve sclerosis/calcification is present, without any evidence of aortic stenosis. Aortic valve mean gradient measures 7.0 mmHg.  7. The inferior vena cava is normal in size with greater than 50% respiratory variability, suggesting right atrial pressure of 3 mmHg. Comparison(s): No significant change from prior study. FINDINGS  Left Ventricle: Left ventricular ejection fraction, by estimation, is 70 to 75%. The left ventricle has hyperdynamic function. The left ventricle has no regional wall motion abnormalities. The left ventricular internal cavity size was normal in size. There is severe concentric left ventricular  hypertrophy. Left ventricular diastolic parameters are indeterminate. Elevated left ventricular end-diastolic pressure. Right Ventricle: The right ventricular size is normal. No increase in right ventricular wall thickness. Right ventricular systolic function is normal. Tricuspid regurgitation signal is inadequate for assessing PA pressure. Left Atrium: Left atrial size was moderately dilated. Right Atrium: Right atrial size was normal in size. Pericardium: Left pleural effusion is present. There is no evidence of pericardial effusion. Presence of pericardial fat pad. Mitral Valve: The mitral valve is abnormal. Severe mitral annular calcification. Mild to moderate mitral valve regurgitation. MV peak gradient, 26.3 mmHg. The mean mitral valve gradient is 13.0 mmHg. Tricuspid Valve: The tricuspid valve is grossly normal. Tricuspid valve regurgitation is trivial. Aortic Valve:  The aortic valve is tricuspid. There is moderate calcification of the aortic valve. Aortic valve regurgitation is not visualized. Mild to moderate aortic valve sclerosis/calcification is present, without any evidence of aortic stenosis. Aortic valve mean gradient measures 7.0 mmHg. Aortic valve peak gradient measures 13.7 mmHg. Aortic valve area, by VTI measures 3.27 cm. Pulmonic Valve: The pulmonic valve was grossly normal. Pulmonic valve regurgitation is trivial. Aorta: The aortic root is normal in size and structure. Venous: The inferior vena cava is normal in size with greater than 50% respiratory variability, suggesting right atrial pressure of 3 mmHg. IAS/Shunts: No atrial level shunt detected by color flow Doppler. Additional Comments: There is pleural effusion in the left lateral region.  LEFT VENTRICLE PLAX 2D LVIDd:         4.20 cm  Diastology LVIDs:         2.80 cm  LV e' medial:    5.00 cm/s LV PW:         1.70 cm  LV E/e' medial:  42.6 LV IVS:        1.70 cm  LV e' lateral:   6.53 cm/s LVOT diam:     1.90 cm  LV E/e' lateral: 32.6 LV SV:         132 LV SV Index:   73 LVOT Area:     2.84 cm  RIGHT VENTRICLE RV Basal diam:  3.35 cm RV Mid diam:    3.00 cm RV S prime:     10.60 cm/s TAPSE (M-mode): 2.6 cm LEFT ATRIUM             Index       RIGHT ATRIUM           Index LA diam:        4.70 cm 2.58 cm/m  RA Area:     15.50 cm LA Vol (A2C):   97.2 ml 53.44 ml/m RA Volume:   42.50 ml  23.37 ml/m LA Vol (A4C):   81.6 ml 44.86 ml/m LA Biplane Vol: 90.2 ml 49.59 ml/m  AORTIC VALVE AV Area (Vmax):    3.40 cm AV Area (Vmean):   3.49 cm AV Area (VTI):     3.27 cm AV Vmax:           185.00 cm/s AV Vmean:          122.500 cm/s AV VTI:            0.405 m AV Peak Grad:      13.7 mmHg AV Mean Grad:      7.0 mmHg LVOT Vmax:         222.00 cm/s LVOT Vmean:        151.000 cm/s LVOT VTI:  0.467 m LVOT/AV VTI ratio: 1.15  AORTA Ao Root diam: 2.40 cm MITRAL VALVE MV Area (PHT): 3.02 cm     SHUNTS MV Area  VTI:   1.99 cm     Systemic VTI:  0.47 m MV Peak grad:  26.3 mmHg    Systemic Diam: 1.90 cm MV Mean grad:  13.0 mmHg MV Vmax:       2.56 m/s MV Vmean:      168.0 cm/s MV Decel Time: 251 msec MV E velocity: 213.00 cm/s MV A velocity: 223.00 cm/s MV E/A ratio:  0.96 Rozann Lesches MD Electronically signed by Rozann Lesches MD Signature Date/Time: 10/14/2020/1:02:22 PM    Final    US BIOPSY (KIDNEY)  Result Date: 10/07/2020 INDICATION: 69 year old female with a history of proteinuria referred for medical renal biopsy EXAM: IMAGE GUIDED MEDICAL RENAL BIOPSY MEDICATIONS: None. ANESTHESIA/SEDATION: Moderate (conscious) sedation was employed during this procedure. A total of Versed 1.0 mg and Fentanyl 50 mcg was administered intravenously. Moderate Sedation Time: 10 minutes. The patient's level of consciousness and vital signs were monitored continuously by radiology nursing throughout the procedure under my direct supervision. FLUOROSCOPY TIME:  Ultrasound COMPLICATIONS: None PROCEDURE: Informed written consent was obtained from the patient after a thorough discussion of the procedural risks, benefits and alternatives. All questions were addressed. Maximal Sterile Barrier Technique was utilized including caps, mask, sterile gowns, sterile gloves, sterile drape, hand hygiene and skin antiseptic. A timeout was performed prior to the initiation of the procedure. Patient was positioned prone position on the gantry table. Images were stored sent to PACs. Once the patient is prepped and draped in the usual sterile fashion, the skin and subcutaneous tissues overlying the left kidney were generously infiltrated 1% lidocaine for local anesthesia. Using ultrasound guidance, a 15 gauge guide needle was advanced into the lower cortex of the left kidney. Once we confirmed location of the needle tip, 2 separate 16 gauge core biopsy were achieved. Two Gel-Foam pledgets were infused with a small amount of saline. The needle was  removed. Final images were stored. The patient tolerated the procedure well and remained hemodynamically stable throughout. No complications were encountered and no significant blood loss encountered. IMPRESSION: Status post ultrasound-guided medical renal biopsy. Signed, Dulcy Fanny. Dellia Nims, RPVI [facility] Electronically Signed   By: Corrie Mckusick D.O.   On: 10/07/2020 10:11    SIGNED: Deatra James, MD, FHM. [facility],  Pager (please use [email or web address] to page/text) Please use Epic Secure Chat for non-urgent communication (7AM-7PM)  If 7PM-7AM, please contact night-coverage [email or web address], 10/15/2020, 10:06 AM > 77 min.  Critical time was spent reviewing overnight events, examined the patient, medical records, discussing care with critical care team at [facility].  Discussing plan of care with family

## 2020-10-15 NOTE — Progress Notes (Addendum)
ANTICOAGULATION CONSULT NOTE - Initial Consult  Pharmacy Consult for heparin Indication: pulmonary embolus (suspected)  Allergies  Allergen Reactions   Benazepril Swelling   Metronidazole Hives   Mobic [Meloxicam] Hives   Penicillins Hives and Swelling   Sulfonamide Derivatives Hives    Patient Measurements: Height: 5\' 2"  (157.5 cm) Weight: 80.7 kg (177 lb 14.6 oz) IBW/kg (Calculated) : 50.1 Heparin Dosing Weight: 70kg  Vital Signs: Temp: 98.5 F (36.9 C) (09/16 2100) Temp Source: Oral (09/16 2100) BP: 118/40 (09/16 2318) Pulse Rate: 58 (09/16 2350)  Labs: Recent Labs    10/12/20 0422 10/12/20 1122 10/12/20 2359 10/13/20 0205 10/13/20 0456 10/13/20 0814 10/14/20 0505 10/14/20 2323  HGB 10.8*   < >  --  9.5*  --  9.5* 8.5* 7.8*  HCT 32.6*   < >  --  28.6*  --  28.9* 25.9* 25.2*  PLT 244   < >  --  214  --  220 199 228  APTT 27  --   --   --   --   --   --   --   LABPROT 14.3  --   --   --   --   --   --   --   INR 1.1  --   --   --   --   --   --   --   CREATININE 4.46*  --   --  4.32*  --  4.66* 5.25*  --   TROPONINIHS  --   --  100* 123* 122*  --   --   --    < > = values in this interval not displayed.    Estimated Creatinine Clearance: 9.8 mL/min (A) (by C-G formula based on SCr of 5.25 mg/dL (H)).   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 Swallow whole. (Patient taking differently: Take 325 mg by mouth daily. Swallow whole.) 90 tablet 3    calcitRIOL (ROCALTROL) 0.25 MCG capsule Take 0.25 mcg by mouth 3 (three) times a week.   10/10/2020   cyclobenzaprine (FLEXERIL) 10 MG tablet TAKE 1 TABLET BY MOUTH  EVERY NIGHT AT  BEDTIME AS NEEDED FOR SPASM (Patient taking differently: Take 10 mg by mouth at bedtime as needed for muscle spasms.) 30 tablet 0 PRN   DILT-XR 180 MG 24 hr capsule Take 1 capsule by mouth once daily (Patient taking differently: Take 180 mg by mouth daily.) 90 capsule 0 10/17/2020   epoetin alfa (EPOGEN) 3000 UNIT/ML injection Inject 3,000 Units into the vein every 14 (fourteen) days.    Past Month   ezetimibe (ZETIA) 10 MG tablet Take 1 tablet (10 mg total) by mouth daily. 90 tablet 3 10/21/2020  febuxostat (ULORIC) 40 MG tablet One daily for gout (Patient taking differently: Take 40 mg by mouth daily. One daily for gout) 30 tablet 5 10/27/2020   gabapentin (NEURONTIN) 600 MG tablet Take 300 mg by mouth 2 (two) times daily.   10/13/2020   hydrALAZINE (APRESOLINE) 25 MG tablet TAKE 1 TABLET BY MOUTH TWICE DAILY (AT  9:00  AM  AND  9:00  PM) (Patient taking differently: Take 25 mg by mouth in the morning and at bedtime.) 180 tablet 0 10/16/2020   HYDROcodone-acetaminophen (NORCO/VICODIN) 5-325 MG tablet One tablet every six hours for pain.  Limit 7 days. (Patient taking differently: Take 1 tablet by mouth every 6 (six) hours as needed for severe pain. Limit 7 days.) 28 tablet 0 Past Month   insulin NPH-regular Human (NOVOLIN 70/30 RELION) (70-30) 100 UNIT/ML injection Inject 105 Units into the skin daily with breakfast. 110 mL 3 10/17/2020   metoprolol tartrate (LOPRESSOR) 100 MG tablet Take 1 tablet by mouth twice daily (Patient taking differently: Take 50 mg by mouth 2 (two) times daily.) 180 tablet 0 10/19/2020 at 2000   potassium chloride (KLOR-CON) 10 MEQ tablet Take 30 mEq by mouth 2 (two) times  daily.   10/23/2020   pravastatin (PRAVACHOL) 20 MG tablet TAKE 1 TABLET BY MOUTH ONCE DAILY BEFORE BREAKFAST (Patient taking differently: Take 20 mg by mouth daily.) 90 tablet 0 10/05/2020   prednisoLONE acetate (PRED FORTE) 1 % ophthalmic suspension Place 1 drop into the left eye in the morning and at bedtime.   10/10/2020   torsemide (DEMADEX) 20 MG tablet Take 2.5 tablets (50 mg total) by mouth 2 (two) times daily. 180 tablet 3 10/04/2020   metolazone (ZAROXOLYN) 5 MG tablet Take 1 tablet by mouth once daily (Patient taking differently: Take 5 mg by mouth daily.) 90 tablet 0 UNK   ONETOUCH ULTRA test strip USE 1 STRIP TO CHECK GLUCOSE TWICE DAILY 200 each 3    spironolactone (ALDACTONE) 25 MG tablet Take 75 mg by mouth daily.   UNK   Scheduled:   calcitRIOL  0.25 mcg Oral Daily   Chlorhexidine Gluconate Cloth  6 each Topical Daily   diltiazem  180 mg Oral Daily   etomidate       ezetimibe  10 mg Oral Daily   febuxostat  40 mg Oral Daily   feeding supplement (GLUCERNA SHAKE)  237 mL Oral TID BM   furosemide  80 mg Intravenous Q12H   gabapentin  200 mg Oral BID   hydrALAZINE  50 mg Oral Q8H   HYDROmorphone        HYDROmorphone (DILAUDID) injection  1 mg Intravenous Once   influenza vaccine adjuvanted  0.5 mL Intramuscular Tomorrow-1000   insulin aspart  0-5 Units Subcutaneous QHS   insulin aspart  0-6 Units Subcutaneous TID WC   insulin aspart protamine- aspart  50 Units Subcutaneous Q breakfast   metoprolol tartrate  75 mg Oral BID   pravastatin  20 mg Oral QAC breakfast   sodium bicarbonate  650 mg Oral TID   succinylcholine       Infusions:   sodium chloride     dexmedetomidine (PRECEDEX) IV infusion     norepinephrine (LEVOPHED) Adult infusion      Assessment: 70yo female admitted 9/13 for abdominal pain, during w/u troponin was found to be elevated; SCDs ordered but no chemoprophylaxis administered during admission; overnight pt experienced sudden onset of SOB while ambulating  and then experienced bradycardia to asystole, now resuscitated  and intubated with concern for PE, to begin heparin; d/w TRH whether thrombolysis would be appropriate in this pt but will hold off given unconfirmed PE and currently somewhat stable in ICU on vent.  Of note Hgb has been trending down during this admission w/ evidence of renal subcapsilar hematoma, Plt WNL.  Goal of Therapy:  Heparin level 0.3-0.7 units/ml Monitor platelets by anticoagulation protocol: Yes   Plan:  Heparin 4000 units IV bolus x1 followed by infusion at 1100 units/hr. Monitor heparin levels and CBC.  Wynona Neat, PharmD, BCPS  10/15/2020,12:23 AM

## 2020-10-15 NOTE — Code Documentation (Signed)
This RN was in pt's room with NT, Luna, helping pt from her recliner to the bathroom. Pt had stated that she was ready for bed, however, wanted to try and urinate first. Pt was able to walk with walker to BR with assistance. Pt got done in BR and walked back into room to get in bed. It was noted that pt was short of breath. Luna checked pt's PO2, which showed pt was 93% on RA. This RN then placed pt on 2L O2 via [place] for comfort. This RN listened to pt's lung sounds. No wheezing or crackles were heard. Luna asked pt about using a Purewick overnight, as pt had just gotten Lasix IV. Pt declined.  This RN had just walked out of pt's room when pt's bed alarm went off. Once back in pt's room, she was sitting on side of bed and stated that she had to urinate again. Pt had taken off the O2. This RN got the [facility] for pt to use. Luna returned to pt's room and assisted this RN with getting pt OOB and on the [facility]. This RN placed O2 back on pt as she was still short of breath. Once pt was done, Wynonia Musty helped with getting pt back in bed. This RN then asked Wynonia Musty to get pt's PO2 again. While Wynonia Musty was getting this done, this RN went to nurses desk to get another RN to come look over pt. Larene Beach RN, returned to pt's room with this RN. It was at that time when [employer] called this RN to report that pt was in 3rd degree block and had a heart rate of 24. Larene Beach and Wynonia Musty were unable to get pt to respond at this time. Decision was made to call a CODE BLUE. This RN left room to get crash cart.

## 2020-10-16 DIAGNOSIS — N189 Chronic kidney disease, unspecified: Secondary | ICD-10-CM | POA: Diagnosis not present

## 2020-10-16 DIAGNOSIS — D631 Anemia in chronic kidney disease: Secondary | ICD-10-CM

## 2020-10-16 DIAGNOSIS — A419 Sepsis, unspecified organism: Secondary | ICD-10-CM

## 2020-10-16 DIAGNOSIS — J9601 Acute respiratory failure with hypoxia: Secondary | ICD-10-CM

## 2020-10-16 DIAGNOSIS — N184 Chronic kidney disease, stage 4 (severe): Secondary | ICD-10-CM | POA: Diagnosis not present

## 2020-10-16 DIAGNOSIS — N185 Chronic kidney disease, stage 5: Secondary | ICD-10-CM | POA: Diagnosis not present

## 2020-10-16 DIAGNOSIS — I469 Cardiac arrest, cause unspecified: Secondary | ICD-10-CM | POA: Diagnosis not present

## 2020-10-16 DIAGNOSIS — R6521 Severe sepsis with septic shock: Secondary | ICD-10-CM

## 2020-10-16 DIAGNOSIS — I421 Obstructive hypertrophic cardiomyopathy: Secondary | ICD-10-CM | POA: Diagnosis not present

## 2020-10-16 DIAGNOSIS — N179 Acute kidney failure, unspecified: Secondary | ICD-10-CM | POA: Diagnosis not present

## 2020-10-16 LAB — BASIC METABOLIC PANEL
Anion gap: 17 — ABNORMAL HIGH (ref 5–15)
BUN: 104 mg/dL — ABNORMAL HIGH (ref 8–23)
CO2: 20 mmol/L — ABNORMAL LOW (ref 22–32)
Calcium: 9 mg/dL (ref 8.9–10.3)
Chloride: 93 mmol/L — ABNORMAL LOW (ref 98–111)
Creatinine, Ser: 5.57 mg/dL — ABNORMAL HIGH (ref 0.44–1.00)
GFR, Estimated: 8 mL/min — ABNORMAL LOW (ref >60)
Glucose, Bld: 124 mg/dL — ABNORMAL HIGH (ref 70–99)
Potassium: 4.2 mmol/L (ref 3.5–5.1)
Sodium: 130 mmol/L — ABNORMAL LOW (ref 135–145)

## 2020-10-16 LAB — GLUCOSE, CAPILLARY
Glucose-Capillary: 122 mg/dL — ABNORMAL HIGH (ref 70–99)
Glucose-Capillary: 133 mg/dL — ABNORMAL HIGH (ref 70–99)
Glucose-Capillary: 134 mg/dL — ABNORMAL HIGH (ref 70–99)
Glucose-Capillary: 158 mg/dL — ABNORMAL HIGH (ref 70–99)
Glucose-Capillary: 222 mg/dL — ABNORMAL HIGH (ref 70–99)

## 2020-10-16 LAB — CBC
HCT: 22.4 % — ABNORMAL LOW (ref 36.0–46.0)
Hemoglobin: 7.2 g/dL — ABNORMAL LOW (ref 12.0–15.0)
MCH: 28.3 pg (ref 26.0–34.0)
MCHC: 32.1 g/dL (ref 30.0–36.0)
MCV: 88.2 fL (ref 80.0–100.0)
Platelets: 208 10*3/uL (ref 150–400)
RBC: 2.54 MIL/uL — ABNORMAL LOW (ref 3.87–5.11)
RDW: 14.1 % (ref 11.5–15.5)
WBC: 16.3 10*3/uL — ABNORMAL HIGH (ref 4.0–10.5)
nRBC: 0.1 % (ref 0.0–0.2)

## 2020-10-16 LAB — URINE CULTURE: Culture: NO GROWTH

## 2020-10-16 LAB — MAGNESIUM
Magnesium: 2.1 mg/dL (ref 1.7–2.4)
Magnesium: 2.2 mg/dL (ref 1.7–2.4)

## 2020-10-16 LAB — PHOSPHORUS
Phosphorus: 10.8 mg/dL — ABNORMAL HIGH (ref 2.5–4.6)
Phosphorus: 10.5 mg/dL — ABNORMAL HIGH (ref 2.5–4.6)

## 2020-10-16 MED ORDER — VITAL HIGH PROTEIN PO LIQD
1000.0000 mL | ORAL | Status: AC
  Administered 2020-10-16 – 2020-10-17 (×2): 1000 mL

## 2020-10-16 MED ORDER — SODIUM CHLORIDE 0.9% FLUSH: 10.0000 mL | INTRAVENOUS | Status: DC | PRN

## 2020-10-16 MED ORDER — AMPICILLIN-SULBACTAM SODIUM 1.5 (1-0.5) G IJ SOLR
1.5000 g | Freq: Two times a day (BID) | INTRAMUSCULAR | Status: AC
  Administered 2020-10-16 – 2020-10-20 (×10): 1.5 g via INTRAVENOUS
  Filled 2020-10-16 (×10): qty 4

## 2020-10-16 MED ORDER — PROSOURCE TF PO LIQD
45.0000 mL | Freq: Two times a day (BID) | ORAL | Status: DC
  Administered 2020-10-16 – 2020-10-20 (×9): 45 mL
  Filled 2020-10-16 (×9): qty 45

## 2020-10-16 MED ORDER — PANTOPRAZOLE 2 MG/ML SUSPENSION
40.0000 mg | Freq: Every day | ORAL | Status: DC
  Administered 2020-10-16 – 2020-10-30 (×15): 40 mg
  Filled 2020-10-16 (×14): qty 20

## 2020-10-16 MED ORDER — SODIUM CHLORIDE 0.9% FLUSH
10.0000 mL | Freq: Two times a day (BID) | INTRAVENOUS | Status: DC
  Administered 2020-10-16: 10 mL
  Administered 2020-10-16: 20 mL
  Administered 2020-10-17: 10 mL

## 2020-10-16 MED ORDER — INSULIN ASPART 100 UNIT/ML IJ SOLN
0.0000 [IU] | INTRAMUSCULAR | Status: DC
  Administered 2020-10-16: 2 [IU] via SUBCUTANEOUS
  Administered 2020-10-16: 3 [IU] via SUBCUTANEOUS
  Administered 2020-10-16 – 2020-10-17 (×3): 5 [IU] via SUBCUTANEOUS
  Administered 2020-10-17 (×2): 3 [IU] via SUBCUTANEOUS
  Administered 2020-10-17 (×3): 5 [IU] via SUBCUTANEOUS
  Administered 2020-10-18 (×3): 8 [IU] via SUBCUTANEOUS
  Administered 2020-10-18: 11 [IU] via SUBCUTANEOUS
  Administered 2020-10-18: 3 [IU] via SUBCUTANEOUS
  Administered 2020-10-18 – 2020-10-19 (×2): 11 [IU] via SUBCUTANEOUS
  Administered 2020-10-19 (×4): 8 [IU] via SUBCUTANEOUS
  Administered 2020-10-19: 5 [IU] via SUBCUTANEOUS
  Administered 2020-10-20: 3 [IU] via SUBCUTANEOUS
  Administered 2020-10-20 (×4): 8 [IU] via SUBCUTANEOUS
  Administered 2020-10-21 (×3): 5 [IU] via SUBCUTANEOUS
  Administered 2020-10-21: 3 [IU] via SUBCUTANEOUS
  Administered 2020-10-21 (×2): 5 [IU] via SUBCUTANEOUS
  Administered 2020-10-22 (×3): 3 [IU] via SUBCUTANEOUS
  Administered 2020-10-22: 5 [IU] via SUBCUTANEOUS
  Administered 2020-10-22 – 2020-10-23 (×3): 3 [IU] via SUBCUTANEOUS
  Administered 2020-10-23: 5 [IU] via SUBCUTANEOUS
  Administered 2020-10-23 (×3): 3 [IU] via SUBCUTANEOUS
  Administered 2020-10-23 – 2020-10-24 (×5): 5 [IU] via SUBCUTANEOUS
  Administered 2020-10-24: 8 [IU] via SUBCUTANEOUS
  Administered 2020-10-24 – 2020-10-25 (×2): 3 [IU] via SUBCUTANEOUS
  Administered 2020-10-25 (×2): 5 [IU] via SUBCUTANEOUS
  Administered 2020-10-25: 8 [IU] via SUBCUTANEOUS
  Administered 2020-10-25: 3 [IU] via SUBCUTANEOUS
  Administered 2020-10-26: 5 [IU] via SUBCUTANEOUS
  Administered 2020-10-26 (×2): 3 [IU] via SUBCUTANEOUS

## 2020-10-16 MED ORDER — INSULIN GLARGINE-YFGN 100 UNIT/ML ~~LOC~~ SOLN
10.0000 [IU] | Freq: Every day | SUBCUTANEOUS | Status: DC
  Administered 2020-10-16: 10 [IU] via SUBCUTANEOUS
  Filled 2020-10-16 (×2): qty 0.1

## 2020-10-16 NOTE — H&P (Addendum)
NAME:  Krista Strickland, MRN:  [id number], DOB:  1950-08-25, LOS: 4 ADMISSION DATE:  09/29/2020, CONSULTATION DATE: 10/15/2020 REFERRING MD: Dr. Roger Shelter, CHIEF COMPLAINT: Cardiac arrest  History of Present Illness:  Krista Strickland is a 69 y.o. female with a past medical history significant for CKD stage V, hypertension, type 2 diabetes, GERD, hyperlipidemia, and hypertrophic cardiomyopathy who presented to the emergency department) with left lower quadrant abdominal pain that began day of admission.  Of note patient had renal biopsy 10/07/2020.  On ED arrival patient was seen hypertensive with all other vital signs within normal limits.  Due to abdominal pain complaints with recent liver biopsy CT abdomen pelvis was obtained and revealed 8 x 4 x 12 cm left present subscapular hematoma for which intervention radiology was consulted and recommended monitoring but if symptoms worsen and arteriogram could be considered.  Late evening of 9/16 patient suffered cardiac arrest.  After returning from the restroom patient had episode dyspnea followed by bradycardia and later asystole.  She received 1 mg of epi with ACLS/CPR with ROSC achieved and was seen with spontaneous respirations but significantly diminished mentation resulting in decision to endotracheally intubate.  Given sudden onset of dyspnea post ambulation with resultant bradycardia concern for PE as cause of cardiac arrest was very high given renal function decision was made to forego CTA and began therapeutic heparin.  Heparin drip was later discontinued due to downtrending of hemoglobin.  PCCM was consulted for further management and decision was made to transfer patient to [facility] for high-level care.  Significant Hospital Events: Including procedures, antibiotic start and stop dates in addition to other pertinent events   9/13 admitted to [facility] with abdominal pain found to have a subcapsular left renal hematoma post biopsy 9/16 cardiac  arrest likely due to aspiration  Interim History / Subjective:  Echocardiogram was done showed no RV hypokinesis or dilatation She has copious amount of thick greenish respiratory secretions  Objective   Blood pressure (!) 131/54, pulse 68, temperature 99.9 F (37.7 C), temperature source Axillary, resp. rate 17, height 5\' 4"  (1.626 m), weight 75.8 kg, SpO2 96 %.    Vent Mode: PSV;CPAP FiO2 (%):  [40 %] 40 % Set Rate:  [18 bmp] 18 bmp Vt Set:  [440 mL] 440 mL PEEP:  [5 cmH20] 5 cmH20 Pressure Support:  [12 cmH20] 12 cmH20 Plateau Pressure:  [15 cmH20-27 cmH20] 16 cmH20   Intake/Output Summary (Last 24 hours) at 10/16/2020 1057 Last data filed at 10/16/2020 1000 Gross per 24 hour  Intake 926.48 ml  Output 1675 ml  Net -748.52 ml   Filed Weights   10/15/20 0025 10/15/20 1115 10/16/20 0413  Weight: 83.4 kg 85.7 kg 75.8 kg    Examination: General: Acute on chronically ill appearing elderly female, lying in the bed, orally intubated HEENT: Atraumatic, normocephalic, ETT, MM pink/moist, PERRL,  Neuro: Opens eyes with vocal stimuli, moving all 4 extremities CV: s1s2 regular rate and rhythm, no murmur, rubs, or gallops,  PULM: Bilateral coarse rhonchorous sounds all over GI: soft, distended, hypoactive in all 4 quadrants Extremities: warm/dry, no edema  Skin: no rashes or lesions  Resolved Hospital Problem list     Assessment & Plan:  S/p PEA cardiac arrest Etiology is unclear, likely due to aspiration event considering patient is having thick, copious amount of greenish respiratory secretions Echocardiogram did not suggest right ventricular hypokinesis or dilation CT angiogram of the chest was not done due to stage IV chronic kidney  disease She opens eyes and follows simple commands  Septic shock due to aspiration pneumonia Acute hypoxic respiratory failure Vasopressors were titrated off Respiratory culture is in process Continue IV Unasyn, adjust antibiotic based on  culture sensitivity data Continue lung protective ventilation Peak and plateau pressure at goal Patient is not ready for SBT today Ensure adequate pulmonary hygiene  Follow cultures  VAP bundle in place  PAD protocol  AKI on CKD stage V Perinephric hematoma post renal biopsy Chronic hyponatremia High anion gap metabolic acidosis Nephrology is following, recommend watchful waiting Lasix challenge was given yesterday, now holding off No plans for hemodialysis yet Sodium bicarbonate infusion was stopped Continue p.o. bicarbonate Avoid nephrotoxic agent Monitor serum creatinine and electrolytes  HOCM Demand cardiac ischemia Continue Cardizem and Lopressor at baseline Troponin with flat trend and peak of 123   Hypertension Hyperlipidemia Continue metoprolol and hydralazine Continue Zetia and pravastatin    Type 2 diabetes Hemoglobin A1c 7.2 Patient takes 70/30 insulin at home at large doses Fingersticks are better controlled here Started on Levemir, continue sliding scale insulin Monitor CBG with goal 140-180  Anemia of CKD coupled with acute blood loss anemia Trend CBC Transfuse per protocol Hemoglobin goal greater than 7  Hypoalbuminemia with mild protein calorie malnutrition Continue tube feeds  Best Practice    Diet/type: tubefeeds DVT prophylaxis: SCD GI prophylaxis: PPI Lines: Central line Foley:  Yes, and it is still needed Code Status:  full code Last date of multidisciplinary goals of care discussion: pending  Labs   CBC: Recent Labs  Lab 10/04/2020 1738 10/12/20 0422 10/13/20 0814 10/14/20 0505 10/14/20 2323 10/15/20 0253 10/16/20 0421  WBC 15.4*   < > 17.6* 13.4* 18.3* 21.9* 16.3*  NEUTROABS 12.7*  --   --   --   --   --   --   HGB 11.8*   < > 9.5* 8.5* 7.8* 8.5* 7.2*  HCT 35.7*   < > 28.9* 25.9* 25.2* 25.7* 22.4*  MCV 88.1   < > 88.1 89.6 93.3 88.3 88.2  PLT 270   < > 220 199 228 245 208   < > = values in this interval not displayed.     Basic Metabolic Panel: Recent Labs  Lab 10/12/20 0422 10/13/20 0205 10/13/20 7494 10/14/20 0505 10/14/20 2322 10/15/20 0253 10/16/20 0421  NA 126*   < > 124* 126* 124* 126* 130*  K 4.5   < > 5.0 4.9 5.5* 5.1 4.2  CL 91*   < > 94* 94* 90* 90* 93*  CO2 23   < > 20* 19* 18* 23 20*  GLUCOSE 54*   < > 161* 175* 265* 172* 124*  BUN 94*   < > 97* 85* 102* 101* 104*  CREATININE 4.46*   < > 4.66* 5.25* 5.32* 5.56* 5.57*  CALCIUM 9.1   < > 8.7* 8.4* 7.8* 8.0* 9.0  MG 1.8  --   --   --  2.4  --   --   PHOS 6.2*  --  6.8*  --   --   --   --    < > = values in this interval not displayed.   GFR: Estimated Creatinine Clearance: 9.4 mL/min (A) (by C-G formula based on SCr of 5.57 mg/dL (H)). Recent Labs  Lab 10/12/20 0422 10/12/20 1122 10/13/20 0814 10/14/20 0505 10/14/20 2322 10/14/20 2323 10/15/20 0253 10/16/20 0421  PROCALCITON 0.34  --  0.65  --   --   --   --   --  WBC 14.7*   < > 17.6* 13.4*  --  18.3* 21.9* 16.3*  LATICACIDVEN  --   --  1.0  --  6.1*  --  1.9  --    < > = values in this interval not displayed.    Liver Function Tests: Recent Labs  Lab 09/29/2020 1738 10/12/20 0422 10/13/20 0814 10/14/20 2322 10/15/20 0253  AST 39 24  --  133* 183*  ALT 43 34  --  101* 121*  ALKPHOS 82 76  --  83 97  BILITOT 0.4 0.5  --  0.7 1.2  PROT 7.0 6.4*  --  6.3* 6.5  ALBUMIN 3.3* 3.1* 2.8* 3.2* 3.3*   Recent Labs  Lab 09/30/2020 1738  LIPASE 37   No results for input(s): AMMONIA in the last 168 hours.  ABG    Component Value Date/Time   PHART 7.238 (L) 10/15/2020 0038   PCO2ART 42.4 10/15/2020 0038   PO2ART 167 (H) 10/15/2020 0038   HCO3 17.2 (L) 10/15/2020 0038   TCO2 36 03/04/2012 1749   ACIDBASEDEF 8.6 (H) 10/15/2020 0038   O2SAT 98.4 10/15/2020 0038     Coagulation Profile: Recent Labs  Lab 10/12/20 0422 10/15/20 0253  INR 1.1 1.2    Cardiac Enzymes: No results for input(s): CKTOTAL, CKMB, CKMBINDEX, TROPONINI in the last 168  hours.  HbA1C: Hemoglobin A1C  Date/Time Value Ref Range Status  09/29/2020 02:49 PM 7.2 (A) 4.0 - 5.6 % Final  06/29/2020 10:23 AM 8.9 (A) 4.0 - 5.6 % Final   Hgb A1c MFr Bld  Date/Time Value Ref Range Status  09/22/2020 01:13 PM 7.6 (H) 4.8 - 5.6 % Final    Comment:    (NOTE) Pre diabetes:          5.7%-6.4%  Diabetes:              >6.4%  Glycemic control for   <7.0% adults with diabetes   03/17/2020 04:24 PM 7.1 (H) 4.8 - 5.6 % Final    Comment:             Prediabetes: 5.7 - 6.4          Diabetes: >6.4          Glycemic control for adults with diabetes: <7.0     CBG: Recent Labs  Lab 10/15/20 1518 10/15/20 1932 10/15/20 2320 10/16/20 0336 10/16/20 0724  GLUCAP 118* 121* 128* 122* 133*   Total critical care time: 44 minutes  Performed by: Crocker care time was exclusive of separately billable procedures and treating other patients.   Critical care was necessary to treat or prevent imminent or life-threatening deterioration.   Critical care was time spent personally by me on the following activities: development of treatment plan with patient and/or surrogate as well as nursing, discussions with consultants, evaluation of patient's response to treatment, examination of patient, obtaining history from patient or surrogate, ordering and performing treatments and interventions, ordering and review of laboratory studies, ordering and review of radiographic studies, pulse oximetry and re-evaluation of patient's condition.   Jacky Kindle MD [facility] See Amion for pager If no response to pager, please call [phone] until 7pm After 7pm, Please call E-link [phone]

## 2020-10-16 NOTE — Progress Notes (Signed)
Cardiology Progress Note  Patient ID: Krista Strickland MRN: [id number] DOB: Nov 30, 1950 Date of Encounter: 10/16/2020  Primary Cardiologist: Rozann Lesches, MD  Subjective   Chief Complaint: None.  HPI: Intubated in the ICU.  Kidney function continues to worsen.  Nephrology holding on dialysis.  ROS:  All other ROS reviewed and negative. Pertinent positives noted in the HPI.     Inpatient Medications  Scheduled Meds:  calcitRIOL  0.25 mcg Per NG tube Daily   chlorhexidine gluconate (MEDLINE KIT)  15 mL Mouth Rinse BID   Chlorhexidine Gluconate Cloth  6 each Topical Daily   diltiazem  60 mg Per Tube Q8H   ezetimibe  10 mg Per Tube Daily   febuxostat  40 mg Per NG tube Daily   feeding supplement (PROSource TF)  45 mL Per Tube BID   feeding supplement (VITAL HIGH PROTEIN)  1,000 mL Per Tube Q24H   gabapentin  200 mg Per Tube Q12H   hydrALAZINE  50 mg Per Tube Q8H   influenza vaccine adjuvanted  0.5 mL Intramuscular Tomorrow-1000   insulin aspart  0-15 Units Subcutaneous Q4H   insulin glargine-yfgn  10 Units Subcutaneous QHS   mouth rinse  15 mL Mouth Rinse 10 times per day   metoprolol tartrate  75 mg Per NG tube BID   pantoprazole sodium  40 mg Per Tube Daily   pravastatin  20 mg Per NG tube QAC breakfast   prednisoLONE acetate  1 drop Left Eye BID   sodium bicarbonate  650 mg Per NG tube TID   sodium chloride flush  10-40 mL Intracatheter Q12H   Continuous Infusions:  sodium chloride Stopped (10/16/20 0955)   ampicillin-sulbactam (UNASYN) IV 1.5 g (10/16/20 0955)   dexmedetomidine (PRECEDEX) IV infusion 1.2 mcg/kg/hr (10/16/20 1000)   norepinephrine (LEVOPHED) Adult infusion Stopped (10/15/20 1109)   phenylephrine (NEO-SYNEPHRINE) Adult infusion     PRN Meds: albuterol, hydrALAZINE, HYDROcodone-acetaminophen, ondansetron (ZOFRAN) IV, sodium chloride flush   Vital Signs   Vitals:   10/16/20 0900 10/16/20 0930 10/16/20 1000 10/16/20 1030  BP: (!) 145/53 (!) 134/55  (!) 134/57 (!) 131/54  Pulse: 72 67 64 68  Resp: _0 Temp:      TempSrc:      SpO2: 97% 96% 97% 96%  Weight:      Height:        Intake/Output Summary (Last 24 hours) at 10/16/2020 1055 Last data filed at 10/16/2020 1000 Gross per 24 hour  Intake 926.48 ml  Output 1675 ml  Net -748.52 ml   Last 3 Weights 10/16/2020 10/15/2020 10/15/2020  Weight (lbs) 167 lb 1.7 oz 188 lb 15 oz 183 lb 13.8 oz  Weight (kg) 75.8 kg 85.7 kg 83.4 kg      Telemetry  Overnight telemetry shows sinus rhythm in the 70s with PVCs, which I personally reviewed.   EKG: Normal sinus rhythm heart rate 66, first-degree AV block, no acute ischemic changes, poor R wave progression however no strong evidence of infarct  Physical Exam   Vitals:   10/16/20 0900 10/16/20 0930 10/16/20 1000 10/16/20 1030  BP: (!) 145/53 (!) 134/55 (!) 134/57 (!) 131/54  Pulse: 72 67 64 68  Resp: _1 Temp:      TempSrc:      SpO2: 97% 96% 97% 96%  Weight:      Height:        Intake/Output Summary (Last 24 hours) at 10/16/2020 1055 Last data filed  at 10/16/2020 1000 Gross per 24 hour  Intake 926.48 ml  Output 1675 ml  Net -748.52 ml    Last 3 Weights 10/16/2020 10/15/2020 10/15/2020  Weight (lbs) 167 lb 1.7 oz 188 lb 15 oz 183 lb 13.8 oz  Weight (kg) 75.8 kg 85.7 kg 83.4 kg    Body mass index is 28.68 kg/m.  General: Intubated, on the vent Head: Atraumatic, normal size  Eyes: PEERLA, EOMI  Neck: Supple, no JVD Endocrine: No thryomegaly Cardiac: Normal S1, S2; RRR; 2 out of 6 systolic ejection murmur Lungs: Diminished breath sounds bilaterally Abd: Soft, nontender, no hepatomegaly  Ext: No edema, pulses 2+ Musculoskeletal: No deformities Skin: Warm and dry, no rashes   Neuro: Alert, will awaken and follow commands  Labs  High Sensitivity Troponin:   Recent Labs  Lab 10/13/20 0205 10/13/20 0456 10/14/20 2322 10/15/20 1358 10/15/20 1554  TROPONINIHS 123* 122* 74* 173* 170*     Cardiac EnzymesNo  results for input(s): TROPONINI in the last 168 hours. No results for input(s): TROPIPOC in the last 168 hours.  Chemistry Recent Labs  Lab 10/12/20 0422 10/13/20 0205 10/13/20 6144 10/14/20 0505 10/14/20 2322 10/15/20 0253 10/16/20 0421  NA 126*   < > 124*   < > 124* 126* 130*  K 4.5   < > 5.0   < > 5.5* 5.1 4.2  CL 91*   < > 94*   < > 90* 90* 93*  CO2 23   < > 20*   < > 18* 23 20*  GLUCOSE 54*   < > 161*   < > 265* 172* 124*  BUN 94*   < > 97*   < > 102* 101* 104*  CREATININE 4.46*   < > 4.66*   < > 5.32* 5.56* 5.57*  CALCIUM 9.1   < > 8.7*   < > 7.8* 8.0* 9.0  PROT 6.4*  --   --   --  6.3* 6.5  --   ALBUMIN 3.1*  --  2.8*  --  3.2* 3.3*  --   AST 24  --   --   --  133* 183*  --   ALT 34  --   --   --  101* 121*  --   ALKPHOS 76  --   --   --  83 97  --   BILITOT 0.5  --   --   --  0.7 1.2  --   GFRNONAA 10*   < > 10*   < > 8* 8* 8*  ANIONGAP 12   < > 10   < > 16* 13 17*   < > = values in this interval not displayed.    Hematology Recent Labs  Lab 10/14/20 2323 10/15/20 0253 10/16/20 0421  WBC 18.3* 21.9* 16.3*  RBC 2.70* 2.91* 2.54*  HGB 7.8* 8.5* 7.2*  HCT 25.2* 25.7* 22.4*  MCV 93.3 88.3 88.2  MCH 28.9 29.2 28.3  MCHC 31.0 33.1 32.1  RDW 14.2 13.9 14.1  PLT 228 245 208   BNPNo results for input(s): BNP, PROBNP in the last 168 hours.  DDimer  Recent Labs  Lab 10/14/20 2323  DDIMER 4.14*     Radiology  DG Abd 1 View  Result Date: 10/15/2020 CLINICAL DATA:  Orogastric tube placement EXAM: ABDOMEN - 1 VIEW COMPARISON:  10/15/2020 4:22 a.m. FINDINGS: A nasogastric/orogastric tube coils once in the stomach and based on its curvature appears to terminate in the stomach body.  Stable right basilar airspace opacity with increased retrocardiac airspace opacity, with nonvisualization of the left hemidiaphragm on the current exam. Some of this may be attributable to the leftward rotation. Gas is present in loops of small and large bowel. IMPRESSION: 1. Orogastric tube  tip is in the stomach body. 2. Increased airspace opacity at the left lung base, also although some of this appearance may be due to leftward rotation on today's radiograph. Stable indistinct airspace opacity at the right lung base. Electronically Signed   By: Van Clines M.D.   On: 10/15/2020 15:14   DG Abd 1 View  Result Date: 10/15/2020 CLINICAL DATA:  Central line placement EXAM: ABDOMEN - 1 VIEW COMPARISON:  Abdominal CT from 4 days ago FINDINGS: Right femoral line with tip at the level of the right sacral ala. Based on atheromatous calcification the tip is likely separate from the external iliac artery. No acute osseous or bowel findings. Partially covered enteric tube looping in the stomach. IMPRESSION: Unremarkable right femoral line. Electronically Signed   By: Jorje Guild M.D.   On: 10/15/2020 04:30   DG CHEST PORT 1 VIEW  Result Date: 10/15/2020 CLINICAL DATA:  Endotracheal tube repositioned EXAM: PORTABLE CHEST 1 VIEW COMPARISON:  10/14/2020 11:33 p.m. FINDINGS: Interval repositioning of the endotracheal tube, which now terminates approximately 3.5 cm above the carina. Enteric tube extends below the inferior field of view. Redemonstrated cardiomegaly and central pulmonary vascular congestion. Redemonstrated interstitial opacities in the perihilar regions. No pleural effusion or pneumothorax. No acute osseous abnormality. IMPRESSION: 1. Interval repositioning of the endotracheal tube, which is now in appropriate position. Otherwise stable support apparatus. 2. Redemonstrated cardiomegaly and pulmonary edema. Electronically Signed   By: Merilyn Baba M.D.   On: 10/15/2020 00:50   DG CHEST PORT 1 VIEW  Result Date: 10/14/2020 CLINICAL DATA:  Code blue.  Post intubation and orogastric tube. EXAM: PORTABLE CHEST 1 VIEW COMPARISON:  Chest x-ray 10/12/2020. FINDINGS: There is right mainstem intubation. Enteric tube extends below the diaphragm. The tip of the enteric tube is not included  on the exam. The heart is enlarged. There is increasing central pulmonary vascular congestion. There are increasing interstitial opacities in the perihilar regions. There is a calcified nodule in the right lung base. There is no pleural effusion or pneumothorax. No acute fractures are seen. IMPRESSION: 1. Right mainstem intubation. Recommend retraction of endotracheal tube approximately 4 cm. 2. The enteric tube extends below the diaphragm, but tip is not visualized. 3. Cardiomegaly with increasing pulmonary edema. 4. These results were called by telephone at the time of interpretation on 10/14/2020 at 11:47 pm to provider ASIA ZIERLE-GHOSH , who verbally acknowledged these results. Electronically Signed   By: Ronney Asters M.D.   On: 10/14/2020 23:47   VAS [place] LOWER EXTREMITY VENOUS (DVT)  Result Date: 10/15/2020  Lower Venous DVT Study Patient Name:  Krista Strickland  Date of Exam:   10/15/2020 Medical Rec #: [id number]         Accession #:    [id number] Date of Birth: [birth year]         Patient Gender: F Patient Age:   69 years Exam Location:  [facility] Procedure:      VAS [place] LOWER EXTREMITY VENOUS (DVT) Referring Phys: Loree Fee HARRIS --------------------------------------------------------------------------------  Indications: Edema.  Limitations: Pitting edema. Comparison Study: No prior study on file Performing Technologist: Sharion Dove RVS  Examination Guidelines: A complete evaluation includes B-mode imaging, spectral Doppler, color Doppler, and power Doppler  as needed of all accessible portions of each vessel. Bilateral testing is considered an integral part of a complete examination. Limited examinations for reoccurring indications may be performed as noted. The reflux portion of the exam is performed with the patient in reverse Trendelenburg.  +---------+---------------+---------+-----------+----------+--------------+ RIGHT    CompressibilityPhasicitySpontaneityPropertiesThrombus Aging  +---------+---------------+---------+-----------+----------+--------------+ CFV      Full           Yes      Yes                                 +---------+---------------+---------+-----------+----------+--------------+ SFJ      Full                                                        +---------+---------------+---------+-----------+----------+--------------+ FV Prox  Full                                                        +---------+---------------+---------+-----------+----------+--------------+ FV Mid   Full                                                        +---------+---------------+---------+-----------+----------+--------------+ FV DistalFull                                                        +---------+---------------+---------+-----------+----------+--------------+ PFV      Full                                                        +---------+---------------+---------+-----------+----------+--------------+ POP      Full           Yes      Yes                                 +---------+---------------+---------+-----------+----------+--------------+ PTV      Full                                                        +---------+---------------+---------+-----------+----------+--------------+ PERO     Full                                                        +---------+---------------+---------+-----------+----------+--------------+   +---------+---------------+---------+-----------+----------+--------------+  LEFT     CompressibilityPhasicitySpontaneityPropertiesThrombus Aging +---------+---------------+---------+-----------+----------+--------------+ CFV      Full           Yes      Yes                                 +---------+---------------+---------+-----------+----------+--------------+ SFJ      Full                                                         +---------+---------------+---------+-----------+----------+--------------+ FV Prox  Full                                                        +---------+---------------+---------+-----------+----------+--------------+ FV Mid   Full                                                        +---------+---------------+---------+-----------+----------+--------------+ FV DistalFull                                                        +---------+---------------+---------+-----------+----------+--------------+ PFV      Full                                                        +---------+---------------+---------+-----------+----------+--------------+ POP      Full           Yes      Yes                                 +---------+---------------+---------+-----------+----------+--------------+ PTV      Full                                                        +---------+---------------+---------+-----------+----------+--------------+ PERO     Full                                                        +---------+---------------+---------+-----------+----------+--------------+     Summary: BILATERAL: - No evidence of deep vein thrombosis seen in the lower extremities, bilaterally. -   *See table(s) above for measurements and observations.    Preliminary    ECHOCARDIOGRAM LIMITED  Result Date: 10/15/2020    ECHOCARDIOGRAM LIMITED  REPORT   Patient Name:   Krista Strickland Date of Exam: 10/15/2020 Medical Rec #:  [id number]        Height:       64.0 in Accession #:    [id number]       Weight:       188.9 lb Date of Birth:  [birth year]        BSA:          1.910 m Patient Age:    44 years         BP:           149/64 mmHg Patient Gender: F                HR:           65 bpm. Exam Location:  Inpatient Procedure: 2D Echo and Color Doppler Indications:    Cardiac arrest  History:        Patient has prior history of Echocardiogram examinations, most                 recent  10/13/2020. Risk Factors:Hypertension, Diabetes and                 Dyslipidemia. Hx stroke. Hypertrophic cardiomyopathy.  Sonographer:    Clayton Lefort RDCS (AE) Referring Phys: [phone] [facility] D HARRIS  Sonographer Comments: Echo performed with patient supine and on artificial respirator. IMPRESSIONS  1. No changes in LV systolic function since previous echo Sept. 15, 2022. Left ventricular ejection fraction, by estimation, is 70 to 75%. The left ventricle has hyperdynamic function. There is severe concentric left ventricular hypertrophy.  2. The mitral valve is grossly normal. Mild to moderate mitral valve regurgitation. Moderate to severe mitral annular calcification.  3. The aortic valve is grossly normal. Aortic valve regurgitation is not visualized. FINDINGS  Left Ventricle: No changes in LV systolic function since previous echo Sept. 15, 2022. Left ventricular ejection fraction, by estimation, is 70 to 75%. The left ventricle has hyperdynamic function. The left ventricular internal cavity size was normal in  size. There is severe concentric left ventricular hypertrophy. Pericardium: There is no evidence of pericardial effusion. Mitral Valve: The mitral valve is grossly normal. Moderate to severe mitral annular calcification. Mild to moderate mitral valve regurgitation. Aortic Valve: The aortic valve is grossly normal. Aortic valve regurgitation is not visualized. Pulmonic Valve: The pulmonic valve was grossly normal. Pulmonic valve regurgitation is not visualized. Aorta: The aortic root and ascending aorta are structurally normal, with no evidence of dilitation. LEFT VENTRICLE PLAX 2D LVIDd:         3.50 cm LVIDs:         2.20 cm LV PW:         1.80 cm LV IVS:        2.10 cm LVOT diam:     1.90 cm LVOT Area:     2.84 cm  LEFT ATRIUM         Index LA diam:    4.00 cm 2.09 cm/m   AORTA Ao Root diam: 2.80 cm  SHUNTS Systemic Diam: 1.90 cm Mertie Moores MD Electronically signed by Mertie Moores MD Signature  Date/Time: 10/15/2020/3:42:51 PM    Final     Cardiac Studies  TTE 10/15/2020  1. No changes in LV systolic function since previous echo Sept. 15, 2022.  Left ventricular ejection fraction, by estimation, is 70 to 75%. The left  ventricle has hyperdynamic function. There is severe concentric left  ventricular hypertrophy.   2. The mitral valve is grossly normal. Mild to moderate mitral valve  regurgitation. Moderate to severe mitral annular calcification.   3. The aortic valve is grossly normal. Aortic valve regurgitation is not  visualized.   Patient Profile  Krista Strickland is a 70 y.o. female with hypertension, CKD stage IV, diabetes, hypertrophic obstructive cardiomyopathy who was admitted from [facility] on 10/15/2020 after cardiac arrest.  This appears to be in asystolic arrest in the setting of hypoxic respiratory failure.  She was initially admitted to [facility] on 10/12/2020 with abdominal pain after recent renal biopsy and found to have left renal subscapular hematoma.  Cardiology was consulted for chest pain episode that was attributed to noncardiac pain.  Assessment & Plan   #Acute hypoxic respiratory failure #PEA arrest/asystole #CKD stage IV with AKI -Initially admitted to [facility] with abdominal pain after renal biopsy.  She suffered a left renal subscapular hematoma. -She did complain of chest pain however her EKG shows no acute ischemic changes and troponins were minimally elevated and flat in the setting of hypertension.  This was attributed to noncardiac symptoms.  Cardiac work-up was not recommended due to worsening kidney disease and anemia which precludes her from invasive angiography. -She did suffer a cardiac arrest which appears to be aspiration related per discussion with critical care medicine.  Her echocardiogram shows normal LV function.  Troponin is minimally elevated and flat.  EKG is nonischemic.  There are no regional wall motion  abnormalities.  PE was considered however given her normal RV size and function this is unlikely the etiology of her cardiac arrest. -She remains critically ill in ICU.  She suffered an AKI likely require hemodialysis in the next 24 hours.  She is not requiring pressor support. -Overall I have low suspicion that this was a primary cardiac event.  There was no VT/VF.  Her troponins are minimally elevated and flat.  EKG is nonischemic.  Do agree this is a PEA arrest which could have been related to an aspiration event. -No plans for invasive angiography.  Clearly her worsening renal function as well as recent subscapular renal hematoma would predispose her to high bleeding risk.  She is also chronically anemic and hemoglobin is trending down.  Would not recommend aspirin.  Clearly would not recommend heparin.  This is just demand.  There is no strong indication for primary acute coronary syndrome. -Would recommend supportive care.  #Severe concentric LVH #Hypertensive heart disease #Concerns for hypertrophic obstructive cardiomyopathy -She does have a faint murmur on exam consistent with LVOT obstruction.  Echo from 10/13/2020 shows gradient of 36 mmHg across the LVOT. -She is currently stable.  Blood pressure stable.  I do not believe her hypertrophic cardiomyopathy or really hypertensive heart disease has anything to do with her cardiac arrest.  There was no VT or VF reported.  Concerning for pulmonary source of arrest.  See discussion above. -For now we will discontinue blood pressure medications as you are able.  There is no evidence that her obstruction is worsening or explains her current clinical course. -Continue home medications as you are able. -If she did need pressors phenylephrine would be the agent of choice.  Any agents that increase cardiac contractility could cause worsening outflow tract obstruction.  We will follow along while she is intubated and remains critically ill.  CRITICAL  CARE Performed by: [place] Bells T O'Neal  Total critical care time: 35 minutes. Critical care time was  exclusive of separately billable procedures and treating other patients. Critical care was necessary to treat or prevent imminent or life-threatening deterioration. Critical care was time spent personally by me on the following activities: development of treatment plan with patient and/or surrogate as well as nursing, discussions with consultants, evaluation of patient's response to treatment, examination of patient, obtaining history from patient or surrogate, ordering and performing treatments and interventions, ordering and review of laboratory studies, ordering and review of radiographic studies, pulse oximetry and re-evaluation of patient's condition.  For questions or updates, please contact Lindsay Please consult [email or web address] for contact info under     Signed, Lake Bells T. Audie Box, MD, [place]  10/16/2020 10:55 AM

## 2020-10-16 NOTE — Progress Notes (Signed)
[facility] NEPHROLOGY PROGRESS NOTE  Assessment/ Plan: Pt is a 70 y.o. yo female  with history of hypertension, diabetes, anemia, secondary hyperparathyroidism, stroke, hypertrophic obstructive cardiomyopathy, HLD, obesity, uterine cancer, CKD stage IV, seen as a consultation for the evaluation of AKI on CKD 4.   #Acute kidney injury on CKD stage IV: Patient has longstanding CKD with baseline creatinine level seems to be around 2.6-2.9 however recently the GFR declined and creatinine level around 3.6 as outpatient.  Reportedly the biopsy result came back hypertensive and diabetic changes with severe interstitial fibrosis and tubular atrophy with mostly chronic changes.  -kidney stable, good urine ouput. S/p lasix, hold on further does for now -monitor strict I/O, daily labs    #Post biopsy left kidney hematoma: 8 x 4 x 12 cm subcapsular hematoma along the left kidney leading to mass-effect on the renal parenchyma.  IR was consulted, no plan for embolization.  Continue to monitor CBC and imaging studies. -slight drop in hgb today, if worsening then repeat renal u/s vs ct w/on contrast (last u/s 9/14-no difference in hematoma)  #Cardiac arrest 9/16-asystole -transferred from [facility] post arrest -brief course of CPR, 1 round of epi -off pressors   #Hyponatremia, hypervolemic: Seems to be chronic.  Asymptomatic.  On torsemide and metolazone as outpatient.  Serum sodium level improving  #Acute urinary retention: now w/ foley, no hematuria   #Metabolic acidosis: Started on oral sodium bicarbonate.   #Hypertension, now hypotensive: pressor support per primary service   #Anemia of CKD and blood loss postbiopsy: Monitor CBC and transfuse as needed.  Subjective: Seen and examined at bedside in ICU. Uop 1.6L, off levo. No acute events. Yellow urine in bag-no hematuria reported  Objective Vital signs in last 24 hours: Vitals:   10/16/20 0600 10/16/20 0700 10/16/20 0800 10/16/20 0801   BP: (!) 135/58 (!) 137/54 (!) 143/57   Pulse: 73 72 66 70  Resp: 19 17 (!) 21 (!) 93  Temp:      TempSrc:      SpO2: 90% 97% 93% 93%  Weight:      Height:       Weight change: 2.3 kg  Intake/Output Summary (Last 24 hours) at 10/16/2020 0859 Last data filed at 10/16/2020 0800 Gross per 24 hour  Intake 860.52 ml  Output 1675 ml  Net -814.48 ml       Labs: Basic Metabolic Panel: Recent Labs  Lab 10/12/20 0422 10/13/20 0205 10/13/20 0814 10/14/20 0505 10/14/20 2322 10/15/20 0253 10/16/20 0421  NA 126*   < > 124*   < > 124* 126* 130*  K 4.5   < > 5.0   < > 5.5* 5.1 4.2  CL 91*   < > 94*   < > 90* 90* 93*  CO2 23   < > 20*   < > 18* 23 20*  GLUCOSE 54*   < > 161*   < > 265* 172* 124*  BUN 94*   < > 97*   < > 102* 101* 104*  CREATININE 4.46*   < > 4.66*   < > 5.32* 5.56* 5.57*  CALCIUM 9.1   < > 8.7*   < > 7.8* 8.0* 9.0  PHOS 6.2*  --  6.8*  --   --   --   --    < > = values in this interval not displayed.   Liver Function Tests: Recent Labs  Lab 10/12/20 0422 10/13/20 0814 10/14/20 2322 10/15/20 0253  AST  24  --  133* 183*  ALT 34  --  101* 121*  ALKPHOS 76  --  83 97  BILITOT 0.5  --  0.7 1.2  PROT 6.4*  --  6.3* 6.5  ALBUMIN 3.1* 2.8* 3.2* 3.3*   Recent Labs  Lab 10/03/2020 1738  LIPASE 37   No results for input(s): AMMONIA in the last 168 hours. CBC: Recent Labs  Lab 10/25/2020 1738 10/12/20 0422 10/13/20 0814 10/14/20 0505 10/14/20 2323 10/15/20 0253 10/16/20 0421  WBC 15.4*   < > 17.6* 13.4* 18.3* 21.9* 16.3*  NEUTROABS 12.7*  --   --   --   --   --   --   HGB 11.8*   < > 9.5* 8.5* 7.8* 8.5* 7.2*  HCT 35.7*   < > 28.9* 25.9* 25.2* 25.7* 22.4*  MCV 88.1   < > 88.1 89.6 93.3 88.3 88.2  PLT 270   < > 220 199 228 245 208   < > = values in this interval not displayed.   Cardiac Enzymes: No results for input(s): CKTOTAL, CKMB, CKMBINDEX, TROPONINI in the last 168 hours. CBG: Recent Labs  Lab 10/15/20 1518 10/15/20 1932 10/15/20 2320  10/16/20 0336 10/16/20 0724  GLUCAP 118* 121* 128* 122* 133*    Iron Studies: No results for input(s): IRON, TIBC, TRANSFERRIN, FERRITIN in the last 72 hours. Studies/Results: DG Abd 1 View  Result Date: 10/15/2020 CLINICAL DATA:  Orogastric tube placement EXAM: ABDOMEN - 1 VIEW COMPARISON:  10/15/2020 4:22 a.m. FINDINGS: A nasogastric/orogastric tube coils once in the stomach and based on its curvature appears to terminate in the stomach body. Stable right basilar airspace opacity with increased retrocardiac airspace opacity, with nonvisualization of the left hemidiaphragm on the current exam. Some of this may be attributable to the leftward rotation. Gas is present in loops of small and large bowel. IMPRESSION: 1. Orogastric tube tip is in the stomach body. 2. Increased airspace opacity at the left lung base, also although some of this appearance may be due to leftward rotation on today's radiograph. Stable indistinct airspace opacity at the right lung base. Electronically Signed   By: Van Clines M.D.   On: 10/15/2020 15:14   DG Abd 1 View  Result Date: 10/15/2020 CLINICAL DATA:  Central line placement EXAM: ABDOMEN - 1 VIEW COMPARISON:  Abdominal CT from 4 days ago FINDINGS: Right femoral line with tip at the level of the right sacral ala. Based on atheromatous calcification the tip is likely separate from the external iliac artery. No acute osseous or bowel findings. Partially covered enteric tube looping in the stomach. IMPRESSION: Unremarkable right femoral line. Electronically Signed   By: Jorje Guild M.D.   On: 10/15/2020 04:30   DG CHEST PORT 1 VIEW  Result Date: 10/15/2020 CLINICAL DATA:  Endotracheal tube repositioned EXAM: PORTABLE CHEST 1 VIEW COMPARISON:  10/14/2020 11:33 p.m. FINDINGS: Interval repositioning of the endotracheal tube, which now terminates approximately 3.5 cm above the carina. Enteric tube extends below the inferior field of view. Redemonstrated  cardiomegaly and central pulmonary vascular congestion. Redemonstrated interstitial opacities in the perihilar regions. No pleural effusion or pneumothorax. No acute osseous abnormality. IMPRESSION: 1. Interval repositioning of the endotracheal tube, which is now in appropriate position. Otherwise stable support apparatus. 2. Redemonstrated cardiomegaly and pulmonary edema. Electronically Signed   By: Merilyn Baba M.D.   On: 10/15/2020 00:50   DG CHEST PORT 1 VIEW  Result Date: 10/14/2020 CLINICAL DATA:  Code blue.  Post intubation and orogastric tube. EXAM: PORTABLE CHEST 1 VIEW COMPARISON:  Chest x-ray 10/12/2020. FINDINGS: There is right mainstem intubation. Enteric tube extends below the diaphragm. The tip of the enteric tube is not included on the exam. The heart is enlarged. There is increasing central pulmonary vascular congestion. There are increasing interstitial opacities in the perihilar regions. There is a calcified nodule in the right lung base. There is no pleural effusion or pneumothorax. No acute fractures are seen. IMPRESSION: 1. Right mainstem intubation. Recommend retraction of endotracheal tube approximately 4 cm. 2. The enteric tube extends below the diaphragm, but tip is not visualized. 3. Cardiomegaly with increasing pulmonary edema. 4. These results were called by telephone at the time of interpretation on 10/14/2020 at 11:47 pm to provider ASIA ZIERLE-GHOSH , who verbally acknowledged these results. Electronically Signed   By: Ronney Asters M.D.   On: 10/14/2020 23:47   VAS [place] LOWER EXTREMITY VENOUS (DVT)  Result Date: 10/15/2020  Lower Venous DVT Study Patient Name:  Krista Strickland  Date of Exam:   10/15/2020 Medical Rec #: [id number]         Accession #:    [id number] Date of Birth: 1950/05/29         Patient Gender: F Patient Age:   26 years Exam Location:  [facility] Procedure:      VAS [place] LOWER EXTREMITY VENOUS (DVT) Referring Phys: Loree Fee HARRIS  --------------------------------------------------------------------------------  Indications: Edema.  Limitations: Pitting edema. Comparison Study: No prior study on file Performing Technologist: Sharion Dove RVS  Examination Guidelines: A complete evaluation includes B-mode imaging, spectral Doppler, color Doppler, and power Doppler as needed of all accessible portions of each vessel. Bilateral testing is considered an integral part of a complete examination. Limited examinations for reoccurring indications may be performed as noted. The reflux portion of the exam is performed with the patient in reverse Trendelenburg.  +---------+---------------+---------+-----------+----------+--------------+ RIGHT    CompressibilityPhasicitySpontaneityPropertiesThrombus Aging +---------+---------------+---------+-----------+----------+--------------+ CFV      Full           Yes      Yes                                 +---------+---------------+---------+-----------+----------+--------------+ SFJ      Full                                                        +---------+---------------+---------+-----------+----------+--------------+ FV Prox  Full                                                        +---------+---------------+---------+-----------+----------+--------------+ FV Mid   Full                                                        +---------+---------------+---------+-----------+----------+--------------+ FV DistalFull                                                        +---------+---------------+---------+-----------+----------+--------------+  PFV      Full                                                        +---------+---------------+---------+-----------+----------+--------------+ POP      Full           Yes      Yes                                 +---------+---------------+---------+-----------+----------+--------------+ PTV      Full                                                         +---------+---------------+---------+-----------+----------+--------------+ PERO     Full                                                        +---------+---------------+---------+-----------+----------+--------------+   +---------+---------------+---------+-----------+----------+--------------+ LEFT     CompressibilityPhasicitySpontaneityPropertiesThrombus Aging +---------+---------------+---------+-----------+----------+--------------+ CFV      Full           Yes      Yes                                 +---------+---------------+---------+-----------+----------+--------------+ SFJ      Full                                                        +---------+---------------+---------+-----------+----------+--------------+ FV Prox  Full                                                        +---------+---------------+---------+-----------+----------+--------------+ FV Mid   Full                                                        +---------+---------------+---------+-----------+----------+--------------+ FV DistalFull                                                        +---------+---------------+---------+-----------+----------+--------------+ PFV      Full                                                        +---------+---------------+---------+-----------+----------+--------------+  POP      Full           Yes      Yes                                 +---------+---------------+---------+-----------+----------+--------------+ PTV      Full                                                        +---------+---------------+---------+-----------+----------+--------------+ PERO     Full                                                        +---------+---------------+---------+-----------+----------+--------------+     Summary: BILATERAL: - No evidence of deep vein thrombosis seen in the  lower extremities, bilaterally. -   *See table(s) above for measurements and observations.    Preliminary    ECHOCARDIOGRAM LIMITED  Result Date: 10/15/2020    ECHOCARDIOGRAM LIMITED REPORT   Patient Name:   BLANCH STANG Date of Exam: 10/15/2020 Medical Rec #:  [id number]        Height:       64.0 in Accession #:    [id number]       Weight:       188.9 lb Date of Birth:  21-Jul-1950        BSA:          1.910 m Patient Age:    56 years         BP:           149/64 mmHg Patient Gender: F                HR:           65 bpm. Exam Location:  Inpatient Procedure: 2D Echo and Color Doppler Indications:    Cardiac arrest  History:        Patient has prior history of Echocardiogram examinations, most                 recent 10/13/2020. Risk Factors:Hypertension, Diabetes and                 Dyslipidemia. Hx stroke. Hypertrophic cardiomyopathy.  Sonographer:    Clayton Lefort RDCS (AE) Referring Phys: [phone] [facility] D HARRIS  Sonographer Comments: Echo performed with patient supine and on artificial respirator. IMPRESSIONS  1. No changes in LV systolic function since previous echo Sept. 15, 2022. Left ventricular ejection fraction, by estimation, is 70 to 75%. The left ventricle has hyperdynamic function. There is severe concentric left ventricular hypertrophy.  2. The mitral valve is grossly normal. Mild to moderate mitral valve regurgitation. Moderate to severe mitral annular calcification.  3. The aortic valve is grossly normal. Aortic valve regurgitation is not visualized. FINDINGS  Left Ventricle: No changes in LV systolic function since previous echo Sept. 15, 2022. Left ventricular ejection fraction, by estimation, is 70 to 75%. The left ventricle has hyperdynamic function. The left ventricular internal cavity size was normal in  size. There is severe concentric left ventricular hypertrophy. Pericardium: There is no evidence of  pericardial effusion. Mitral Valve: The mitral valve is grossly normal. Moderate to severe  mitral annular calcification. Mild to moderate mitral valve regurgitation. Aortic Valve: The aortic valve is grossly normal. Aortic valve regurgitation is not visualized. Pulmonic Valve: The pulmonic valve was grossly normal. Pulmonic valve regurgitation is not visualized. Aorta: The aortic root and ascending aorta are structurally normal, with no evidence of dilitation. LEFT VENTRICLE PLAX 2D LVIDd:         3.50 cm LVIDs:         2.20 cm LV PW:         1.80 cm LV IVS:        2.10 cm LVOT diam:     1.90 cm LVOT Area:     2.84 cm  LEFT ATRIUM         Index LA diam:    4.00 cm 2.09 cm/m   AORTA Ao Root diam: 2.80 cm  SHUNTS Systemic Diam: 1.90 cm Mertie Moores MD Electronically signed by Mertie Moores MD Signature Date/Time: 10/15/2020/3:42:51 PM    Final     Medications: Infusions:  sodium chloride 10 mL/hr at 10/16/20 0800   ampicillin-sulbactam (UNASYN) IV     dexmedetomidine (PRECEDEX) IV infusion 1.2 mcg/kg/hr (10/16/20 0814)   norepinephrine (LEVOPHED) Adult infusion Stopped (10/15/20 1109)   phenylephrine (NEO-SYNEPHRINE) Adult infusion      Scheduled Medications:  calcitRIOL  0.25 mcg Per NG tube Daily   chlorhexidine gluconate (MEDLINE KIT)  15 mL Mouth Rinse BID   Chlorhexidine Gluconate Cloth  6 each Topical Daily   diltiazem  60 mg Per Tube Q8H   ezetimibe  10 mg Per Tube Daily   febuxostat  40 mg Per NG tube Daily   feeding supplement (PROSource TF)  45 mL Per Tube BID   feeding supplement (VITAL HIGH PROTEIN)  1,000 mL Per Tube Q24H   gabapentin  200 mg Per Tube Q12H   hydrALAZINE  50 mg Per Tube Q8H   influenza vaccine adjuvanted  0.5 mL Intramuscular Tomorrow-1000   insulin aspart  0-15 Units Subcutaneous Q4H   insulin glargine-yfgn  10 Units Subcutaneous QHS   mouth rinse  15 mL Mouth Rinse 10 times per day   metoprolol tartrate  75 mg Per NG tube BID   pantoprazole sodium  40 mg Per Tube Daily   pravastatin  20 mg Per NG tube QAC breakfast   prednisoLONE acetate  1 drop  Left Eye BID   sodium bicarbonate  650 mg Per NG tube TID   sodium chloride flush  10-40 mL Intracatheter Q12H    have reviewed scheduled and prn medications.  Physical Exam: General:intubated, sedated Heart:RRR Lungs:intuabted, bl chest expansion, no crackles Abdomen:soft, Non-tender, non-distended Extremities: Bilateral ankle edema present+ Neuro: sedated  Jakai Onofre 10/16/2020,8:59 AM  LOS: 4 days

## 2020-10-17 DIAGNOSIS — I421 Obstructive hypertrophic cardiomyopathy: Secondary | ICD-10-CM | POA: Diagnosis not present

## 2020-10-17 DIAGNOSIS — N189 Chronic kidney disease, unspecified: Secondary | ICD-10-CM | POA: Diagnosis not present

## 2020-10-17 DIAGNOSIS — N185 Chronic kidney disease, stage 5: Secondary | ICD-10-CM | POA: Diagnosis not present

## 2020-10-17 DIAGNOSIS — A419 Sepsis, unspecified organism: Secondary | ICD-10-CM | POA: Diagnosis not present

## 2020-10-17 DIAGNOSIS — N179 Acute kidney failure, unspecified: Secondary | ICD-10-CM | POA: Diagnosis not present

## 2020-10-17 LAB — BASIC METABOLIC PANEL
Anion gap: 20 — ABNORMAL HIGH (ref 5–15)
BUN: 122 mg/dL — ABNORMAL HIGH (ref 8–23)
CO2: 16 mmol/L — ABNORMAL LOW (ref 22–32)
Calcium: 8.6 mg/dL — ABNORMAL LOW (ref 8.9–10.3)
Chloride: 95 mmol/L — ABNORMAL LOW (ref 98–111)
Creatinine, Ser: 5.63 mg/dL — ABNORMAL HIGH (ref 0.44–1.00)
GFR, Estimated: 8 mL/min — ABNORMAL LOW (ref >60)
Glucose, Bld: 188 mg/dL — ABNORMAL HIGH (ref 70–99)
Potassium: 3.9 mmol/L (ref 3.5–5.1)
Sodium: 131 mmol/L — ABNORMAL LOW (ref 135–145)

## 2020-10-17 LAB — CBC
HCT: 23.4 % — ABNORMAL LOW (ref 36.0–46.0)
Hemoglobin: 7.8 g/dL — ABNORMAL LOW (ref 12.0–15.0)
MCH: 28.8 pg (ref 26.0–34.0)
MCHC: 33.3 g/dL (ref 30.0–36.0)
MCV: 86.3 fL (ref 80.0–100.0)
Platelets: 232 10*3/uL (ref 150–400)
RBC: 2.71 MIL/uL — ABNORMAL LOW (ref 3.87–5.11)
RDW: 14.2 % (ref 11.5–15.5)
WBC: 16.4 10*3/uL — ABNORMAL HIGH (ref 4.0–10.5)
nRBC: 0.1 % (ref 0.0–0.2)

## 2020-10-17 LAB — GLUCOSE, CAPILLARY
Glucose-Capillary: 169 mg/dL — ABNORMAL HIGH (ref 70–99)
Glucose-Capillary: 189 mg/dL — ABNORMAL HIGH (ref 70–99)
Glucose-Capillary: 195 mg/dL — ABNORMAL HIGH (ref 70–99)
Glucose-Capillary: 206 mg/dL — ABNORMAL HIGH (ref 70–99)
Glucose-Capillary: 207 mg/dL — ABNORMAL HIGH (ref 70–99)
Glucose-Capillary: 212 mg/dL — ABNORMAL HIGH (ref 70–99)
Glucose-Capillary: 230 mg/dL — ABNORMAL HIGH (ref 70–99)
Glucose-Capillary: 243 mg/dL — ABNORMAL HIGH (ref 70–99)

## 2020-10-17 LAB — PHOSPHORUS
Phosphorus: 10.9 mg/dL — ABNORMAL HIGH (ref 2.5–4.6)
Phosphorus: 10.4 mg/dL — ABNORMAL HIGH (ref 2.5–4.6)

## 2020-10-17 LAB — MAGNESIUM
Magnesium: 2.5 mg/dL — ABNORMAL HIGH (ref 1.7–2.4)
Magnesium: 2.5 mg/dL — ABNORMAL HIGH (ref 1.7–2.4)

## 2020-10-17 MED ORDER — VITAL 1.5 CAL PO LIQD
1000.0000 mL | ORAL | Status: DC
  Administered 2020-10-17 – 2020-10-29 (×13): 1000 mL
  Filled 2020-10-17 (×7): qty 1000

## 2020-10-17 MED ORDER — INSULIN GLARGINE-YFGN 100 UNIT/ML ~~LOC~~ SOLN
20.0000 [IU] | Freq: Every day | SUBCUTANEOUS | Status: DC
  Administered 2020-10-17 – 2020-10-18 (×2): 20 [IU] via SUBCUTANEOUS
  Filled 2020-10-17 (×3): qty 0.2

## 2020-10-17 MED ORDER — FENTANYL BOLUS VIA INFUSION
50.0000 ug | INTRAVENOUS | Status: DC | PRN
  Filled 2020-10-17: qty 50

## 2020-10-17 MED ORDER — FENTANYL CITRATE (PF) 100 MCG/2ML IJ SOLN
50.0000 ug | INTRAMUSCULAR | Status: DC | PRN
  Administered 2020-10-17 – 2020-10-20 (×11): 50 ug via INTRAVENOUS
  Filled 2020-10-17 (×12): qty 2

## 2020-10-17 NOTE — Progress Notes (Signed)
Initial Nutrition Assessment  DOCUMENTATION CODES:   Not applicable  INTERVENTION:   Tube feeding via OG tube: Vital 1.5 at 50 ml/h (1200 ml per day) Prosource TF 45 ml BID  Provides 1880 kcal, 103 gm protein, 912 ml free water daily  D/C Vital High Protein  NUTRITION DIAGNOSIS:   Inadequate oral intake related to inability to eat as evidenced by NPO status.  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    9/16 cardiac arrest likely due to aspiration, pt with thick, copious amount of greenish respiratory secretions; intubated 9/18 TF initiated   Nephrology following, no indications for RRT yet. Pt currently off pressors.  Admission weight 80.7 kg, closer to weights listed in Aug 2022 of 81.3 kg. Prior to July 2022 usual weight was 85 kg, question fluid status with these weights.  Per RN pt lives at home with elderly husband. States that pt is ambulatory without devices at home.  Pt awake and opens her eyes but is unable to answer any questions.  Pt with copious secretions preventing extubation.    Patient is currently intubated on ventilator support MV: 12.5 L/min Temp (24hrs), Avg:99.2 F (37.3 C), Min:98.1 F (36.7 C), Max:100.5 F (38.1 C) FiO2: 50% MAP: > 76   Medications reviewed and include: SSI, 10 units semglee, protonix, Nabicarb 650 mg TID Precedex  Labs reviewed: Na 131, BUN: 122, Cr: 5.63, PO4: 10.8 > 10.5 > 10.9, Magnesium: 2.5 K: 3.9  CBG's: 169-212   UOP: 1075 ml  I&O: -1.3 L  OG: tip in the  stomach   NUTRITION - FOCUSED PHYSICAL EXAM:  Flowsheet Row Most Recent Value  Orbital Region No depletion  Upper Arm Region No depletion  Thoracic and Lumbar Region No depletion  Buccal Region Unable to assess  Clavicle Bone Region No depletion  Clavicle and Acromion Bone Region No depletion  Scapular Bone Region Mild depletion  Dorsal Hand No depletion  Patellar Region Severe depletion  Anterior Thigh Region Severe depletion  Posterior Calf Region Unable to assess  [BLE edema]  Edema (RD Assessment) Severe  Hair Reviewed  Eyes Reviewed  Mouth Unable to assess  Skin Reviewed  Nails Reviewed       Diet Order:   Diet Order             Diet NPO time specified  Diet effective now                   EDUCATION NEEDS:   Not appropriate for education at this time  Skin:  Skin Assessment: Reviewed RN Assessment  Last BM:  9/16  Height:   Ht Readings from Last 1 Encounters:  10/15/20 5\' 4"  (1.626 m)    Weight:   Wt Readings from Last 1 Encounters:  10/17/20 86.4 kg    BMI:  Body mass index is 32.7 kg/m.  Estimated Nutritional Needs:   Kcal:  1800-2000  Protein:  95-115 grams  Fluid:  >1.9 L/day  Lockie Pares., RD, LDN, CNSC See AMiON for contact information

## 2020-10-17 NOTE — Progress Notes (Signed)
Cardiology Progress Note  Patient ID: Krista Strickland MRN: [id number] DOB: 05/19/1950 Date of Encounter: 10/17/2020  Primary Cardiologist: Rozann Lesches, MD  Subjective   Chief Complaint: Intubated on vent  HPI: Intubated sedated in the ICU.  Kidney function continues to decline.  Holding on dialysis.  ROS:  All other ROS reviewed and negative. Pertinent positives noted in the HPI.     Inpatient Medications  Scheduled Meds:  calcitRIOL  0.25 mcg Per NG tube Daily   chlorhexidine gluconate (MEDLINE KIT)  15 mL Mouth Rinse BID   Chlorhexidine Gluconate Cloth  6 each Topical Daily   diltiazem  60 mg Per Tube Q8H   ezetimibe  10 mg Per Tube Daily   febuxostat  40 mg Per NG tube Daily   feeding supplement (PROSource TF)  45 mL Per Tube BID   feeding supplement (VITAL HIGH PROTEIN)  1,000 mL Per Tube Q24H   gabapentin  200 mg Per Tube Q12H   hydrALAZINE  50 mg Per Tube Q8H   influenza vaccine adjuvanted  0.5 mL Intramuscular Tomorrow-1000   insulin aspart  0-15 Units Subcutaneous Q4H   insulin glargine-yfgn  20 Units Subcutaneous QHS   mouth rinse  15 mL Mouth Rinse 10 times per day   metoprolol tartrate  75 mg Per NG tube BID   pantoprazole sodium  40 mg Per Tube Daily   pravastatin  20 mg Per NG tube QAC breakfast   prednisoLONE acetate  1 drop Left Eye BID   sodium bicarbonate  650 mg Per NG tube TID   sodium chloride flush  10-40 mL Intracatheter Q12H   Continuous Infusions:  sodium chloride 10 mL/hr at 10/17/20 0800   ampicillin-sulbactam (UNASYN) IV 1.5 g (10/17/20 0925)   dexmedetomidine (PRECEDEX) IV infusion Stopped (10/17/20 0702)   phenylephrine (NEO-SYNEPHRINE) Adult infusion     PRN Meds: albuterol, fentaNYL (SUBLIMAZE) injection, hydrALAZINE, HYDROcodone-acetaminophen, ondansetron (ZOFRAN) IV, sodium chloride flush   Vital Signs   Vitals:   10/17/20 1000 10/17/20 1030 10/17/20 1100 10/17/20 1124  BP: (!) 167/64 (!) 158/103 (!) 176/73   Pulse: 76 82 (!)  183 (!) 211  Resp: (!) 30 (!) 31 (!) 33 (!) 33  Temp:      TempSrc:      SpO2: 94% (!) 88% 94% 100%  Weight:      Height:        Intake/Output Summary (Last 24 hours) at 10/17/2020 1126 Last data filed at 10/17/2020 0944 Gross per 24 hour  Intake 1082.34 ml  Output 1225 ml  Net -142.66 ml   Last 3 Weights 10/17/2020 10/16/2020 10/15/2020  Weight (lbs) 190 lb 7.6 oz 167 lb 1.7 oz 188 lb 15 oz  Weight (kg) 86.4 kg 75.8 kg 85.7 kg      Telemetry  Overnight telemetry shows sinus rhythm in the 80s, which I personally reviewed.   Physical Exam   Vitals:   10/17/20 1000 10/17/20 1030 10/17/20 1100 10/17/20 1124  BP: (!) 167/64 (!) 158/103 (!) 176/73   Pulse: 76 82 (!) 183 (!) 211  Resp: (!) 30 (!) 31 (!) 33 (!) 33  Temp:      TempSrc:      SpO2: 94% (!) 88% 94% 100%  Weight:      Height:        Intake/Output Summary (Last 24 hours) at 10/17/2020 1126 Last data filed at 10/17/2020 0944 Gross per 24 hour  Intake 1082.34 ml  Output 1225 ml  Net -142.66  ml    Last 3 Weights 10/17/2020 10/16/2020 10/15/2020  Weight (lbs) 190 lb 7.6 oz 167 lb 1.7 oz 188 lb 15 oz  Weight (kg) 86.4 kg 75.8 kg 85.7 kg    Body mass index is 32.7 kg/m.   General: Intubated, breathing over the ventilator Head: Atraumatic, normal size  Eyes: PEERLA, EOMI  Neck: Supple, no JVD Endocrine: No thryomegaly Cardiac: Normal S1, S2; regular rhythm, 2 out of 6 systolic ejection murmur Lungs: Diminished breath sounds bilaterally Abd: Soft, nontender, no hepatomegaly  Ext: No edema, pulses 2+ Musculoskeletal: No deformities Skin: Warm and dry, no rashes   Neuro: Intubated on the vent, will follow commands  Labs  High Sensitivity Troponin:   Recent Labs  Lab 10/13/20 0205 10/13/20 0456 10/14/20 2322 10/15/20 1358 10/15/20 1554  TROPONINIHS 123* 122* 74* 173* 170*     Cardiac EnzymesNo results for input(s): TROPONINI in the last 168 hours. No results for input(s): TROPIPOC in the last 168 hours.   Chemistry Recent Labs  Lab 10/12/20 0422 10/13/20 0205 10/13/20 1324 10/14/20 0505 10/14/20 2322 10/15/20 0253 10/16/20 0421 10/17/20 0423  NA 126*   < > 124*   < > 124* 126* 130* 131*  K 4.5   < > 5.0   < > 5.5* 5.1 4.2 3.9  CL 91*   < > 94*   < > 90* 90* 93* 95*  CO2 23   < > 20*   < > 18* 23 20* 16*  GLUCOSE 54*   < > 161*   < > 265* 172* 124* 188*  BUN 94*   < > 97*   < > 102* 101* 104* 122*  CREATININE 4.46*   < > 4.66*   < > 5.32* 5.56* 5.57* 5.63*  CALCIUM 9.1   < > 8.7*   < > 7.8* 8.0* 9.0 8.6*  PROT 6.4*  --   --   --  6.3* 6.5  --   --   ALBUMIN 3.1*  --  2.8*  --  3.2* 3.3*  --   --   AST 24  --   --   --  133* 183*  --   --   ALT 34  --   --   --  101* 121*  --   --   ALKPHOS 76  --   --   --  83 97  --   --   BILITOT 0.5  --   --   --  0.7 1.2  --   --   GFRNONAA 10*   < > 10*   < > 8* 8* 8* 8*  ANIONGAP 12   < > 10   < > 16* 13 17* 20*   < > = values in this interval not displayed.    Hematology Recent Labs  Lab 10/15/20 0253 10/16/20 0421 10/17/20 0423  WBC 21.9* 16.3* 16.4*  RBC 2.91* 2.54* 2.71*  HGB 8.5* 7.2* 7.8*  HCT 25.7* 22.4* 23.4*  MCV 88.3 88.2 86.3  MCH 29.2 28.3 28.8  MCHC 33.1 32.1 33.3  RDW 13.9 14.1 14.2  PLT 245 208 232   BNPNo results for input(s): BNP, PROBNP in the last 168 hours.  DDimer  Recent Labs  Lab 10/14/20 2323  DDIMER 4.14*     Radiology  DG Abd 1 View  Result Date: 10/15/2020 CLINICAL DATA:  Orogastric tube placement EXAM: ABDOMEN - 1 VIEW COMPARISON:  10/15/2020 4:22 a.m. FINDINGS: A nasogastric/orogastric tube coils  once in the stomach and based on its curvature appears to terminate in the stomach body. Stable right basilar airspace opacity with increased retrocardiac airspace opacity, with nonvisualization of the left hemidiaphragm on the current exam. Some of this may be attributable to the leftward rotation. Gas is present in loops of small and large bowel. IMPRESSION: 1. Orogastric tube tip is in the stomach  body. 2. Increased airspace opacity at the left lung base, also although some of this appearance may be due to leftward rotation on today's radiograph. Stable indistinct airspace opacity at the right lung base. Electronically Signed   By: Van Clines M.D.   On: 10/15/2020 15:14   VAS [place] LOWER EXTREMITY VENOUS (DVT)  Result Date: 10/15/2020  Lower Venous DVT Study Patient Name:  Krista Strickland  Date of Exam:   10/15/2020 Medical Rec #: [id number]         Accession #:    [id number] Date of Birth: [birth year]         Patient Gender: F Patient Age:   58 years Exam Location:  [facility] Procedure:      VAS [place] LOWER EXTREMITY VENOUS (DVT) Referring Phys: Loree Fee HARRIS --------------------------------------------------------------------------------  Indications: Edema.  Limitations: Pitting edema. Comparison Study: No prior study on file Performing Technologist: Sharion Dove RVS  Examination Guidelines: A complete evaluation includes B-mode imaging, spectral Doppler, color Doppler, and power Doppler as needed of all accessible portions of each vessel. Bilateral testing is considered an integral part of a complete examination. Limited examinations for reoccurring indications may be performed as noted. The reflux portion of the exam is performed with the patient in reverse Trendelenburg.  +---------+---------------+---------+-----------+----------+--------------+ RIGHT    CompressibilityPhasicitySpontaneityPropertiesThrombus Aging +---------+---------------+---------+-----------+----------+--------------+ CFV      Full           Yes      Yes                                 +---------+---------------+---------+-----------+----------+--------------+ SFJ      Full                                                        +---------+---------------+---------+-----------+----------+--------------+ FV Prox  Full                                                         +---------+---------------+---------+-----------+----------+--------------+ FV Mid   Full                                                        +---------+---------------+---------+-----------+----------+--------------+ FV DistalFull                                                        +---------+---------------+---------+-----------+----------+--------------+ PFV  Full                                                        +---------+---------------+---------+-----------+----------+--------------+ POP      Full           Yes      Yes                                 +---------+---------------+---------+-----------+----------+--------------+ PTV      Full                                                        +---------+---------------+---------+-----------+----------+--------------+ PERO     Full                                                        +---------+---------------+---------+-----------+----------+--------------+   +---------+---------------+---------+-----------+----------+--------------+ LEFT     CompressibilityPhasicitySpontaneityPropertiesThrombus Aging +---------+---------------+---------+-----------+----------+--------------+ CFV      Full           Yes      Yes                                 +---------+---------------+---------+-----------+----------+--------------+ SFJ      Full                                                        +---------+---------------+---------+-----------+----------+--------------+ FV Prox  Full                                                        +---------+---------------+---------+-----------+----------+--------------+ FV Mid   Full                                                        +---------+---------------+---------+-----------+----------+--------------+ FV DistalFull                                                         +---------+---------------+---------+-----------+----------+--------------+ PFV      Full                                                        +---------+---------------+---------+-----------+----------+--------------+  POP      Full           Yes      Yes                                 +---------+---------------+---------+-----------+----------+--------------+ PTV      Full                                                        +---------+---------------+---------+-----------+----------+--------------+ PERO     Full                                                        +---------+---------------+---------+-----------+----------+--------------+     Summary: BILATERAL: - No evidence of deep vein thrombosis seen in the lower extremities, bilaterally. -   *See table(s) above for measurements and observations.    Preliminary    ECHOCARDIOGRAM LIMITED  Result Date: 10/15/2020    ECHOCARDIOGRAM LIMITED REPORT   Patient Name:   Krista Strickland Date of Exam: 10/15/2020 Medical Rec #:  [id number]        Height:       64.0 in Accession #:    [id number]       Weight:       188.9 lb Date of Birth:  11-03-1950        BSA:          1.910 m Patient Age:    33 years         BP:           149/64 mmHg Patient Gender: F                HR:           65 bpm. Exam Location:  Inpatient Procedure: 2D Echo and Color Doppler Indications:    Cardiac arrest  History:        Patient has prior history of Echocardiogram examinations, most                 recent 10/13/2020. Risk Factors:Hypertension, Diabetes and                 Dyslipidemia. Hx stroke. Hypertrophic cardiomyopathy.  Sonographer:    Clayton Lefort RDCS (AE) Referring Phys: [phone] [facility] D HARRIS  Sonographer Comments: Echo performed with patient supine and on artificial respirator. IMPRESSIONS  1. No changes in LV systolic function since previous echo Sept. 15, 2022. Left ventricular ejection fraction, by estimation, is 70 to 75%. The left ventricle has  hyperdynamic function. There is severe concentric left ventricular hypertrophy.  2. The mitral valve is grossly normal. Mild to moderate mitral valve regurgitation. Moderate to severe mitral annular calcification.  3. The aortic valve is grossly normal. Aortic valve regurgitation is not visualized. FINDINGS  Left Ventricle: No changes in LV systolic function since previous echo Sept. 15, 2022. Left ventricular ejection fraction, by estimation, is 70 to 75%. The left ventricle has hyperdynamic function. The left ventricular internal cavity size was normal in  size. There is severe concentric left ventricular hypertrophy. Pericardium: There is no evidence of  pericardial effusion. Mitral Valve: The mitral valve is grossly normal. Moderate to severe mitral annular calcification. Mild to moderate mitral valve regurgitation. Aortic Valve: The aortic valve is grossly normal. Aortic valve regurgitation is not visualized. Pulmonic Valve: The pulmonic valve was grossly normal. Pulmonic valve regurgitation is not visualized. Aorta: The aortic root and ascending aorta are structurally normal, with no evidence of dilitation. LEFT VENTRICLE PLAX 2D LVIDd:         3.50 cm LVIDs:         2.20 cm LV PW:         1.80 cm LV IVS:        2.10 cm LVOT diam:     1.90 cm LVOT Area:     2.84 cm  LEFT ATRIUM         Index LA diam:    4.00 cm 2.09 cm/m   AORTA Ao Root diam: 2.80 cm  SHUNTS Systemic Diam: 1.90 cm Mertie Moores MD Electronically signed by Mertie Moores MD Signature Date/Time: 10/15/2020/3:42:51 PM    Final     Cardiac Studies  TTE 10/15/2020  1. No changes in LV systolic function since previous echo Sept. 15, 2022.  Left ventricular ejection fraction, by estimation, is 70 to 75%. The left  ventricle has hyperdynamic function. There is severe concentric left  ventricular hypertrophy.   2. The mitral valve is grossly normal. Mild to moderate mitral valve  regurgitation. Moderate to severe mitral annular calcification.    3. The aortic valve is grossly normal. Aortic valve regurgitation is not  visualized.   Patient Profile  Krista Strickland is a 70 y.o. female with hypertension, CKD stage IV, diabetes, hypertrophic obstructive cardiomyopathy who was admitted from [facility] on 10/15/2020 after cardiac arrest.  This appears to be in asystolic arrest in the setting of hypoxic respiratory failure.  She was initially admitted to [facility] on 10/12/2020 with abdominal pain after recent renal biopsy and found to have left renal subscapular hematoma.  Cardiology was consulted for chest pain episode that was attributed to noncardiac pain.  Assessment & Plan   #Acute hypoxic respiratory failure #PEA arrest/asystole #CKD stage IV with AKI -Initially admitted to [facility] with abdominal pain after renal biopsy.  Suffered a left renal subscapular hematoma. -Did complain of chest pain however EKG was nonischemic.  Cardiology saw her any pain and did not recommend further cardiac work-up.  Echo was unchanged from prior. -She has now suffered a cardiac arrest which appears to possibly be aspiration related.  Initial rhythm was asystole.  No evidence of VT or VF.  RV size is normal.  No concerns for PE. -Does not appear to be cardiac etiology. -No plans for invasive angiography at this time.  Her worsening renal function and anemia are concerning.  Also with recent left subscapular hematoma.  #Severe concentric LVH #Hypertensive heart disease #Hypertrophic obstructive cardiomyopathy -Faint murmur on exam. -LVOT gradient 36 mmHg -Stable from my standpoint. -We will continue home medications as you are able.  [facility] will sign off.   Medication Recommendations: Supportive care as you are doing.  Resume home cardiac meds as you are able. Other recommendations (labs, testing, etc): None. Follow up as an outpatient: She may keep outpatient scheduled follow-up.  She remains critically ill in  ICU.  Should you need further assistance please [facility] cardiology on-call.  For questions or updates, please contact [place] Please consult [email or web address] for contact info under   Time  Spent with Patient: I have spent a total of 25 minutes with patient reviewing hospital notes, telemetry, EKGs, labs and examining the patient as well as establishing an assessment and plan that was discussed with the patient.  > 50% of time was spent in direct patient care.    Signed, Addison Naegeli. Audie Box, MD, [place]  10/17/2020 11:26 AM

## 2020-10-17 NOTE — Progress Notes (Addendum)
OT Cancellation Note  Patient Details Name: Krista Strickland MRN: [id number] DOB: 07-07-1950   Cancelled Treatment:    Reason Eval/Treat Not Completed: Medical issues which prohibited therapy. Intubated and remains critically ill. OT to sign off at this time. Will await new order.   Gloris Manchester OTR/L Supplemental OT, Department of rehab services [phone]  Rebbie Lauricella R H. 10/17/2020, 8:23 AM

## 2020-10-17 NOTE — Progress Notes (Signed)
PT Cancellation Note  Patient Details Name: Krista Strickland MRN: [id number] DOB: Dec 10, 1950   Cancelled Treatment:    Reason Eval/Treat Not Completed: Patient not medically ready (pt with PT order maintained from [facility] however with pt change in medical status and currently intubated at FiO2 70% will sign off and await new order)   Michio Thier B Alba Kriesel 10/17/2020, 6:43 AM [place] Pager: [phone] Office: [phone]

## 2020-10-17 NOTE — Progress Notes (Signed)
Patient femoral CVC line does not draw back blood. E link notified as well as oncoming staff.

## 2020-10-17 NOTE — Progress Notes (Signed)
Inpatient Diabetes Program Recommendations  AACE/ADA: New Consensus Statement on Inpatient Glycemic Control (2015)  Target Ranges:  Prepandial:   less than 140 mg/dL      Peak postprandial:   less than 180 mg/dL (1-2 hours)      Critically ill patients:  140 - 180 mg/dL   Lab Results  Component Value Date   GLUCAP 206 (H) 10/17/2020   HGBA1C 7.2 (A) 09/29/2020    Review of Glycemic Control Results for AVELINE, DAUS (MRN [id number]) as of 10/17/2020 10:11  Ref. Range 10/17/2020 00:21 10/17/2020 03:00 10/17/2020 04:26 10/17/2020 07:28  Glucose-Capillary Latest Ref Range: 70 - 99 mg/dL 212 (H) 169 (H) 189 (H) 206 (H)   Diabetes history: Type 2 DM Outpatient Diabetes medications: Novolin 70/30 105 units QAM Current orders for Inpatient glycemic control: Semglee 10 units QHS, Novolog 0-15 units Q4H Vital @ 40 ml/hr  Inpatient Diabetes Program Recommendations:    Consider adding Novolog 3 units Q4H (for tube feed coverage, tube stopped or held in the event tube feeds are stopped).   Thanks, Bronson Curb, MSN, RNC-OB Diabetes Coordinator [phone] (8a-5p)

## 2020-10-17 NOTE — Progress Notes (Signed)
[facility] ROUNDING NOTE   Subjective:   Interval History: 70 year old lady with stage IV chronic kidney disease diabetes mellitus type 2 hyperlipidemia hypertrophic cardiomyopathy presents to the emergency department left lower quadrant pain status postbiopsy 10/07/2020.  Was found to have 8 x 4 x 12 cm subcapsular hematoma.  Late evening 10/15/2018.  With cardiac arrest received 1 mg of epi with ACLS/CPR with ROSC achieved.  Concerns for pulmonary embolus.  Heparin initiated.  Transferred from [facility] to [facility].  Urine output 1 L 10/16/2020  Blood pressure 1 3561 pulse 87 temperature 98.1 O2 sats 90% FiO2 7% on ventilator.  Sodium 131 potassium 3.9 chloride 95 CO2 16 BUN 122 creatinine 5.63 glucose 188 calcium 8.6 phosphorus 10.9 magnesium 2.5 hemoglobin 7.8    Objective:  Vital signs in last 24 hours:  Temp:  [98.1 F (36.7 C)-99.9 F (37.7 C)] 98.1 F (36.7 C) (09/19 0401) Pulse Rate:  [59-72] 63 (09/19 0500) Resp:  [15-93] 18 (09/19 0500) BP: (120-150)/(49-64) 144/64 (09/19 0500) SpO2:  [91 %-99 %] 99 % (09/19 0500) FiO2 (%):  [40 %-70 %] 70 % (09/19 0400) Weight:  [86.4 kg] 86.4 kg (09/19 0500)  Weight change: 0.7 kg Filed Weights   10/15/20 1115 10/16/20 0413 10/17/20 0500  Weight: 85.7 kg 75.8 kg 86.4 kg    Intake/Output: I/O last 3 completed shifts: In: 1922.3 [I.V.:887.4; NG/GT:441.3; IV Piggyback:593.6] Out: 2300 [Urine:2300]   Intake/Output this shift:  Total I/O In: 433.4 [I.V.:233.4; IV Piggyback:200] Out: -   General:intubated, sedated Heart:RRR Lungs:intuabted, bl chest expansion, no crackles Abdomen:soft, Non-tender, non-distended Extremities: Bilateral ankle edema present+ Neuro: sedated   Basic Metabolic Panel: Recent Labs  Lab 10/12/20 0422 10/13/20 0205 10/13/20 0102 10/14/20 0505 10/14/20 2322 10/15/20 0253 10/16/20 0421 10/16/20 1015 10/16/20 1723 10/17/20 0423  NA 126*   < > 124* 126* 124* 126* 130*  --    --  131*  K 4.5   < > 5.0 4.9 5.5* 5.1 4.2  --   --  3.9  CL 91*   < > 94* 94* 90* 90* 93*  --   --  95*  CO2 23   < > 20* 19* 18* 23 20*  --   --  16*  GLUCOSE 54*   < > 161* 175* 265* 172* 124*  --   --  188*  BUN 94*   < > 97* 85* 102* 101* 104*  --   --  122*  CREATININE 4.46*   < > 4.66* 5.25* 5.32* 5.56* 5.57*  --   --  5.63*  CALCIUM 9.1   < > 8.7* 8.4* 7.8* 8.0* 9.0  --   --  8.6*  MG 1.8  --   --   --  2.4  --   --  2.1 2.2 2.5*  PHOS 6.2*  --  6.8*  --   --   --   --  10.8* 10.5* 10.9*   < > = values in this interval not displayed.    Liver Function Tests: Recent Labs  Lab 10/08/2020 1738 10/12/20 0422 10/13/20 0814 10/14/20 2322 10/15/20 0253  AST 39 24  --  133* 183*  ALT 43 34  --  101* 121*  ALKPHOS 82 76  --  83 97  BILITOT 0.4 0.5  --  0.7 1.2  PROT 7.0 6.4*  --  6.3* 6.5  ALBUMIN 3.3* 3.1* 2.8* 3.2* 3.3*   Recent Labs  Lab 10/07/2020 1738  LIPASE  37   No results for input(s): AMMONIA in the last 168 hours.  CBC: Recent Labs  Lab 10/02/2020 1738 10/12/20 0422 10/14/20 0505 10/14/20 2323 10/15/20 0253 10/16/20 0421 10/17/20 0423  WBC 15.4*   < > 13.4* 18.3* 21.9* 16.3* 16.4*  NEUTROABS 12.7*  --   --   --   --   --   --   HGB 11.8*   < > 8.5* 7.8* 8.5* 7.2* 7.8*  HCT 35.7*   < > 25.9* 25.2* 25.7* 22.4* 23.4*  MCV 88.1   < > 89.6 93.3 88.3 88.2 86.3  PLT 270   < > 199 228 245 208 232   < > = values in this interval not displayed.    Cardiac Enzymes: No results for input(s): CKTOTAL, CKMB, CKMBINDEX, TROPONINI in the last 168 hours.  BNP: Invalid input(s): POCBNP  CBG: Recent Labs  Lab 10/16/20 1546 10/16/20 1940 10/17/20 0021 10/17/20 0300 10/17/20 0426  GLUCAP 158* 222* 212* 169* 189*    Microbiology: Results for orders placed or performed during the hospital encounter of 10/12/2020  Resp Panel by RT-PCR (Flu A&B, Covid) Nasopharyngeal Swab     Status: None   Collection Time: 10/16/2020  9:07 PM   Specimen: Nasopharyngeal Swab;  Nasopharyngeal(NP) swabs in vial transport medium  Result Value Ref Range Status   SARS Coronavirus 2 by RT PCR NEGATIVE NEGATIVE Final    Comment: (NOTE) SARS-CoV-2 target nucleic acids are NOT DETECTED.  The SARS-CoV-2 RNA is generally detectable in upper respiratory specimens during the acute phase of infection. The lowest concentration of SARS-CoV-2 viral copies this assay can detect is 138 copies/mL. A negative result does not preclude SARS-Cov-2 infection and should not be used as the sole basis for treatment or other patient management decisions. A negative result may occur with  improper specimen collection/handling, submission of specimen other than nasopharyngeal swab, presence of viral mutation(s) within the areas targeted by this assay, and inadequate number of viral copies(<138 copies/mL). A negative result must be combined with clinical observations, patient history, and epidemiological information. The expected result is Negative.  Fact Sheet for Patients:  EntrepreneurPulse.com.au  Fact Sheet for Healthcare Providers:  IncredibleEmployment.be  This test is no t yet approved or cleared by the [place] FDA and  has been authorized for detection and/or diagnosis of SARS-CoV-2 by FDA under an Emergency Use Authorization (EUA). This EUA will remain  in effect (meaning this test can be used) for the duration of the COVID-19 declaration under Section 564(b)(1) of the Act, 21 U.S.C.section 360bbb-3(b)(1), unless the authorization is terminated  or revoked sooner.       Influenza A by PCR NEGATIVE NEGATIVE Final   Influenza B by PCR NEGATIVE NEGATIVE Final    Comment: (NOTE) The Xpert Xpress SARS-CoV-2/FLU/RSV plus assay is intended as an aid in the diagnosis of influenza from Nasopharyngeal swab specimens and should not be used as a sole basis for treatment. Nasal washings and aspirates are unacceptable for Xpert Xpress  SARS-CoV-2/FLU/RSV testing.  Fact Sheet for Patients: EntrepreneurPulse.com.au  Fact Sheet for Healthcare Providers: IncredibleEmployment.be  This test is not yet approved or cleared by the [place] FDA and has been authorized for detection and/or diagnosis of SARS-CoV-2 by FDA under an Emergency Use Authorization (EUA). This EUA will remain in effect (meaning this test can be used) for the duration of the COVID-19 declaration under Section 564(b)(1) of the Act, 21 U.S.C. section 360bbb-3(b)(1), unless the authorization is terminated or revoked.  Performed at [facility], [address], [place], [place] [address]   MRSA Next Gen by PCR, Nasal     Status: None   Collection Time: 10/12/20  5:00 PM   Specimen: Nasal Mucosa; Nasal Swab  Result Value Ref Range Status   MRSA by PCR Next Gen NOT DETECTED NOT DETECTED Final    Comment: (NOTE) The GeneXpert MRSA Assay (FDA approved for NASAL specimens only), is one component of a comprehensive MRSA colonization surveillance program. It is not intended to diagnose MRSA infection nor to guide or monitor treatment for MRSA infections. Test performance is not FDA approved in patients less than 61 years old. Performed at [facility], [address] [place], [place] [address]   Urine Culture     Status: None   Collection Time: 10/15/20  4:51 AM   Specimen: Urine, Catheterized  Result Value Ref Range Status   Specimen Description   Final    URINE, CATHETERIZED Performed at [facility], [address] [place], [place] [address]    Special Requests   Final    NONE Performed at [facility], [address] [place], [place] [address]    Culture   Final    NO GROWTH Performed at [facility], [place] [address] [place], [place] [address]    Report Status 10/16/2020 FINAL  Final  Culture, blood (x 2)     Status: None (Preliminary result)   Collection Time: 10/15/20  5:02 AM   Specimen: BLOOD  LEFT WRIST  Result Value Ref Range Status   Specimen Description   Final    BLOOD LEFT WRIST BOTTLES DRAWN AEROBIC AND ANAEROBIC   Special Requests Blood Culture adequate volume  Final   Culture   Final    NO GROWTH 1 DAY Performed at [facility], [address], [place], [place] [address]    Report Status PENDING  Incomplete  Culture, blood (x 2)     Status: None (Preliminary result)   Collection Time: 10/15/20  5:13 AM   Specimen: BLOOD LEFT ARM  Result Value Ref Range Status   Specimen Description BLOOD LEFT ARM BOTTLES DRAWN AEROBIC AND ANAEROBIC  Final   Special Requests Blood Culture adequate volume  Final   Culture   Final    NO GROWTH 1 DAY Performed at [facility], [address], [place], [place] [address]    Report Status PENDING  Incomplete  Culture, Respiratory w Gram Stain     Status: None (Preliminary result)   Collection Time: 10/16/20  8:52 AM   Specimen: Tracheal Aspirate; Respiratory  Result Value Ref Range Status   Specimen Description TRACHEAL ASPIRATE  Final   Special Requests NONE  Final   Gram Stain   Final    RARE WBC PRESENT, PREDOMINANTLY PMN RARE GRAM POSITIVE COCCI IN PAIRS Performed at [facility], [place] [address], [place], [place] [address]    Culture PENDING  Incomplete   Report Status PENDING  Incomplete    Coagulation Studies: Recent Labs    10/15/20 0253  LABPROT 15.4*  INR 1.2    Urinalysis: Recent Labs    10/15/20 0451  COLORURINE AMBER*  LABSPEC 1.014  PHURINE 6.0  GLUCOSEU 50*  HGBUR SMALL*  BILIRUBINUR NEGATIVE  KETONESUR NEGATIVE  PROTEINUR >=300*  NITRITE NEGATIVE  LEUKOCYTESUR NEGATIVE      Imaging: DG Abd 1 View  Result Date: 10/15/2020 CLINICAL DATA:  Orogastric tube placement EXAM: ABDOMEN - 1 VIEW COMPARISON:  10/15/2020  4:22 a.m. FINDINGS: A nasogastric/orogastric tube coils once in the stomach and based on its curvature appears to terminate in the stomach body. Stable right basilar airspace opacity  with increased retrocardiac airspace opacity, with nonvisualization of the left hemidiaphragm on the current exam. Some of this may be attributable to the leftward rotation. Gas is present in loops of small and large bowel. IMPRESSION: 1. Orogastric tube tip is in the stomach body. 2. Increased airspace opacity at the left lung base, also although some of this appearance may be due to leftward rotation on today's radiograph. Stable indistinct airspace opacity at the right lung base. Electronically Signed   By: Van Clines M.D.   On: 10/15/2020 15:14   VAS [place] LOWER EXTREMITY VENOUS (DVT)  Result Date: 10/15/2020  Lower Venous DVT Study Patient Name:  Krista Strickland  Date of Exam:   10/15/2020 Medical Rec #: [id number]         Accession #:    [id number] Date of Birth: July 17, 1950         Patient Gender: F Patient Age:   62 years Exam Location:  [facility] Procedure:      VAS [place] LOWER EXTREMITY VENOUS (DVT) Referring Phys: Loree Fee HARRIS --------------------------------------------------------------------------------  Indications: Edema.  Limitations: Pitting edema. Comparison Study: No prior study on file Performing Technologist: Sharion Dove RVS  Examination Guidelines: A complete evaluation includes B-mode imaging, spectral Doppler, color Doppler, and power Doppler as needed of all accessible portions of each vessel. Bilateral testing is considered an integral part of a complete examination. Limited examinations for reoccurring indications may be performed as noted. The reflux portion of the exam is performed with the patient in reverse Trendelenburg.  +---------+---------------+---------+-----------+----------+--------------+ RIGHT    CompressibilityPhasicitySpontaneityPropertiesThrombus Aging +---------+---------------+---------+-----------+----------+--------------+ CFV      Full           Yes      Yes                                  +---------+---------------+---------+-----------+----------+--------------+ SFJ      Full                                                        +---------+---------------+---------+-----------+----------+--------------+ FV Prox  Full                                                        +---------+---------------+---------+-----------+----------+--------------+ FV Mid   Full                                                        +---------+---------------+---------+-----------+----------+--------------+ FV DistalFull                                                        +---------+---------------+---------+-----------+----------+--------------+  PFV      Full                                                        +---------+---------------+---------+-----------+----------+--------------+ POP      Full           Yes      Yes                                 +---------+---------------+---------+-----------+----------+--------------+ PTV      Full                                                        +---------+---------------+---------+-----------+----------+--------------+ PERO     Full                                                        +---------+---------------+---------+-----------+----------+--------------+   +---------+---------------+---------+-----------+----------+--------------+ LEFT     CompressibilityPhasicitySpontaneityPropertiesThrombus Aging +---------+---------------+---------+-----------+----------+--------------+ CFV      Full           Yes      Yes                                 +---------+---------------+---------+-----------+----------+--------------+ SFJ      Full                                                        +---------+---------------+---------+-----------+----------+--------------+ FV Prox  Full                                                         +---------+---------------+---------+-----------+----------+--------------+ FV Mid   Full                                                        +---------+---------------+---------+-----------+----------+--------------+ FV DistalFull                                                        +---------+---------------+---------+-----------+----------+--------------+ PFV      Full                                                        +---------+---------------+---------+-----------+----------+--------------+  POP      Full           Yes      Yes                                 +---------+---------------+---------+-----------+----------+--------------+ PTV      Full                                                        +---------+---------------+---------+-----------+----------+--------------+ PERO     Full                                                        +---------+---------------+---------+-----------+----------+--------------+     Summary: BILATERAL: - No evidence of deep vein thrombosis seen in the lower extremities, bilaterally. -   *See table(s) above for measurements and observations.    Preliminary    ECHOCARDIOGRAM LIMITED  Result Date: 10/15/2020    ECHOCARDIOGRAM LIMITED REPORT   Patient Name:   EMMAGENE ORTNER Date of Exam: 10/15/2020 Medical Rec #:  [id number]        Height:       64.0 in Accession #:    [id number]       Weight:       188.9 lb Date of Birth:  March 01, 1950        BSA:          1.910 m Patient Age:    78 years         BP:           149/64 mmHg Patient Gender: F                HR:           65 bpm. Exam Location:  Inpatient Procedure: 2D Echo and Color Doppler Indications:    Cardiac arrest  History:        Patient has prior history of Echocardiogram examinations, most                 recent 10/13/2020. Risk Factors:Hypertension, Diabetes and                 Dyslipidemia. Hx stroke. Hypertrophic cardiomyopathy.  Sonographer:    Clayton Lefort RDCS  (AE) Referring Phys: [phone] [facility] D HARRIS  Sonographer Comments: Echo performed with patient supine and on artificial respirator. IMPRESSIONS  1. No changes in LV systolic function since previous echo Sept. 15, 2022. Left ventricular ejection fraction, by estimation, is 70 to 75%. The left ventricle has hyperdynamic function. There is severe concentric left ventricular hypertrophy.  2. The mitral valve is grossly normal. Mild to moderate mitral valve regurgitation. Moderate to severe mitral annular calcification.  3. The aortic valve is grossly normal. Aortic valve regurgitation is not visualized. FINDINGS  Left Ventricle: No changes in LV systolic function since previous echo Sept. 15, 2022. Left ventricular ejection fraction, by estimation, is 70 to 75%. The left ventricle has hyperdynamic function. The left ventricular internal cavity size was normal in  size. There is severe concentric left ventricular hypertrophy. Pericardium: There is no evidence of  pericardial effusion. Mitral Valve: The mitral valve is grossly normal. Moderate to severe mitral annular calcification. Mild to moderate mitral valve regurgitation. Aortic Valve: The aortic valve is grossly normal. Aortic valve regurgitation is not visualized. Pulmonic Valve: The pulmonic valve was grossly normal. Pulmonic valve regurgitation is not visualized. Aorta: The aortic root and ascending aorta are structurally normal, with no evidence of dilitation. LEFT VENTRICLE PLAX 2D LVIDd:         3.50 cm LVIDs:         2.20 cm LV PW:         1.80 cm LV IVS:        2.10 cm LVOT diam:     1.90 cm LVOT Area:     2.84 cm  LEFT ATRIUM         Index LA diam:    4.00 cm 2.09 cm/m   AORTA Ao Root diam: 2.80 cm  SHUNTS Systemic Diam: 1.90 cm Mertie Moores MD Electronically signed by Mertie Moores MD Signature Date/Time: 10/15/2020/3:42:51 PM    Final      Medications:    sodium chloride 10 mL/hr at 10/17/20 0500   ampicillin-sulbactam (UNASYN) IV Stopped (10/16/20  2356)   dexmedetomidine (PRECEDEX) IV infusion 0.6 mcg/kg/hr (10/17/20 0500)   phenylephrine (NEO-SYNEPHRINE) Adult infusion      calcitRIOL  0.25 mcg Per NG tube Daily   chlorhexidine gluconate (MEDLINE KIT)  15 mL Mouth Rinse BID   Chlorhexidine Gluconate Cloth  6 each Topical Daily   diltiazem  60 mg Per Tube Q8H   ezetimibe  10 mg Per Tube Daily   febuxostat  40 mg Per NG tube Daily   feeding supplement (PROSource TF)  45 mL Per Tube BID   feeding supplement (VITAL HIGH PROTEIN)  1,000 mL Per Tube Q24H   gabapentin  200 mg Per Tube Q12H   hydrALAZINE  50 mg Per Tube Q8H   influenza vaccine adjuvanted  0.5 mL Intramuscular Tomorrow-1000   insulin aspart  0-15 Units Subcutaneous Q4H   insulin glargine-yfgn  10 Units Subcutaneous QHS   mouth rinse  15 mL Mouth Rinse 10 times per day   metoprolol tartrate  75 mg Per NG tube BID   pantoprazole sodium  40 mg Per Tube Daily   pravastatin  20 mg Per NG tube QAC breakfast   prednisoLONE acetate  1 drop Left Eye BID   sodium bicarbonate  650 mg Per NG tube TID   sodium chloride flush  10-40 mL Intracatheter Q12H   albuterol, hydrALAZINE, HYDROcodone-acetaminophen, ondansetron (ZOFRAN) IV, sodium chloride flush  Assessment/ Plan:  1.Acute kidney injury on CKD stage IV: Patient has longstanding CKD with baseline creatinine level seems to be around 2.6-2.9 however recently the GFR declined and creatinine level around 3.6 as outpatient.  Reportedly the biopsy result came back hypertensive and diabetic changes with severe interstitial fibrosis and tubular atrophy with mostly chronic changes.  -No indications for acute hemodialysis at this particular point.  We will need to discuss dialysis needs with family and critical care medicine. -monitor strict I/O, daily labs.    2Post biopsy left kidney hematoma: 8 x 4 x 12 cm subcapsular hematoma along the left kidney leading to mass-effect on the renal parenchyma.  IR was consulted, no plan for  embolization.  Continue to monitor CBC and imaging studies. -slight drop in hgb today, if worsening then repeat renal u/s vs ct w/on contrast (last u/s 9/14-no difference in hematoma)   3Cardiac arrest  9/16-asystole -transferred from [facility] post arrest -brief course of CPR, 1 round of epi -off pressors   4Hyponatremia, hypervolemic: Seems to be chronic.  Asymptomatic.  On torsemide and metolazone as outpatient.  Serum sodium level improving   5Acute urinary retention: now w/ foley, no hematuria    6Metabolic acidosis: Started on oral sodium bicarbonate.   7Hypertension, now hypotensive: pressor support per primary service   8Anemia of CKD and blood loss postbiopsy: Monitor CBC and transfuse as needed.   LOS: [place] _0 _1 :54 AM

## 2020-10-17 NOTE — Plan of Care (Signed)

## 2020-10-17 NOTE — Progress Notes (Signed)
NAME:  Krista Strickland, MRN:  [id number], DOB:  [birth year], LOS: 5 ADMISSION DATE:  10/04/2020, CONSULTATION DATE: 10/15/2020 REFERRING MD: Dr. Roger Shelter, CHIEF COMPLAINT: Cardiac arrest  History of Present Illness:  Krista Strickland is a 70 y.o. female with a past medical history significant for CKD stage V, hypertension, type 2 diabetes, GERD, hyperlipidemia, and hypertrophic cardiomyopathy who presented to the emergency department) with left lower quadrant abdominal pain that began day of admission.  Of note patient had renal biopsy 10/07/2020.  On ED arrival patient was seen hypertensive with all other vital signs within normal limits.  Due to abdominal pain complaints with recent liver biopsy CT abdomen pelvis was obtained and revealed 8 x 4 x 12 cm left present subscapular hematoma for which intervention radiology was consulted and recommended monitoring but if symptoms worsen and arteriogram could be considered.  Late evening of 9/16 patient suffered cardiac arrest.  After returning from the restroom patient had episode dyspnea followed by bradycardia and later asystole.  She received 1 mg of epi with ACLS/CPR with ROSC achieved and was seen with spontaneous respirations but significantly diminished mentation resulting in decision to endotracheally intubate.  Given sudden onset of dyspnea post ambulation with resultant bradycardia concern for PE as cause of cardiac arrest was very high given renal function decision was made to forego CTA and began therapeutic heparin.  Heparin drip was later discontinued due to downtrending of hemoglobin.  PCCM was consulted for further management and decision was made to transfer patient to [facility] for high-level care.  Significant Hospital Events: Including procedures, antibiotic start and stop dates in addition to other pertinent events   9/13 admitted to [facility] with abdominal pain found to have a subcapsular left renal hematoma post biopsy 9/16 cardiac  arrest likely due to aspiration 9/18 passed SBT but copious secretion.   Interim History / Subjective:  Continues to have copious secretions.  SBT aborted due to tachypnea.  Objective   Blood pressure (!) 173/62, pulse 81, temperature (!) 100.5 F (38.1 C), temperature source Axillary, resp. rate (!) 28, height 5\' 4"  (1.626 m), weight 86.4 kg, SpO2 93 %.    Vent Mode: PRVC FiO2 (%):  [40 %-70 %] 40 % Set Rate:  [18 bmp] 18 bmp Vt Set:  [440 mL] 440 mL PEEP:  [5 cmH20] 5 cmH20 Plateau Pressure:  [14 cmH20-17 cmH20] 17 cmH20   Intake/Output Summary (Last 24 hours) at 10/17/2020 1223 Last data filed at 10/17/2020 0944 Gross per 24 hour  Intake 1017.29 ml  Output 1225 ml  Net -207.71 ml    Filed Weights   10/15/20 1115 10/16/20 0413 10/17/20 0500  Weight: 85.7 kg 75.8 kg 86.4 kg    Examination: General: Acute on chronically ill appearing elderly female, lying in the bed, orally intubated HEENT: Atraumatic, normocephalic, ETT, MM pink/moist, PERRL,  Neuro: Opens eyes with vocal stimuli, moving all 4 extremities CV: s1s2 regular rate and rhythm, no murmur, rubs, or gallops,  PULM: Bilateral coarse rhonchorous sounds all over GI: soft, distended, hypoactive in all 4 quadrants Extremities: warm/dry, no edema  Skin: no rashes or lesions  Resolved Hospital Problem list     Assessment & Plan:  S/p PEA cardiac arrest due to hypoxia - following comands Septic shock due to aspiration pneumonia - now resolved Critically ill due to acute hypoxic respiratory failure AKI on CKD stage V Perinephric hematoma post renal biopsy Chronic hyponatremia High anion gap metabolic acidosis due to kidney disease HOCM  Demand cardiac ischemia Hypertension Hyperlipidemia Type 2 diabetes Anemia of CKD coupled with acute blood loss anemia Hypoalbuminemia with mild protein calorie malnutrition   Plan:  - Continue daily SBT's. -Chest physiotherapy, continue antibiotics. -Consider extubation  when secretions subside. -Appears to have made a good recovery from cardiac arrest.  No further investigation at this time. -No signs of decompensated heart failure.  We will continue current cardiac therapy. -Dialysis per nephrology.  Best Practice    Diet/type: tubefeeds DVT prophylaxis: SCD GI prophylaxis: PPI Lines: Central line Foley:  Yes, and it is still needed Code Status:  full code Last date of multidisciplinary goals of care discussion: pending  Labs   CBC: Recent Labs  Lab 10/10/2020 1738 10/12/20 0422 10/14/20 0505 10/14/20 2323 10/15/20 0253 10/16/20 0421 10/17/20 0423  WBC 15.4*   < > 13.4* 18.3* 21.9* 16.3* 16.4*  NEUTROABS 12.7*  --   --   --   --   --   --   HGB 11.8*   < > 8.5* 7.8* 8.5* 7.2* 7.8*  HCT 35.7*   < > 25.9* 25.2* 25.7* 22.4* 23.4*  MCV 88.1   < > 89.6 93.3 88.3 88.2 86.3  PLT 270   < > 199 228 245 208 232   < > = values in this interval not displayed.     Basic Metabolic Panel: Recent Labs  Lab 10/12/20 0422 10/13/20 0205 10/13/20 4259 10/14/20 0505 10/14/20 2322 10/15/20 0253 10/16/20 0421 10/16/20 1015 10/16/20 1723 10/17/20 0423  NA 126*   < > 124* 126* 124* 126* 130*  --   --  131*  K 4.5   < > 5.0 4.9 5.5* 5.1 4.2  --   --  3.9  CL 91*   < > 94* 94* 90* 90* 93*  --   --  95*  CO2 23   < > 20* 19* 18* 23 20*  --   --  16*  GLUCOSE 54*   < > 161* 175* 265* 172* 124*  --   --  188*  BUN 94*   < > 97* 85* 102* 101* 104*  --   --  122*  CREATININE 4.46*   < > 4.66* 5.25* 5.32* 5.56* 5.57*  --   --  5.63*  CALCIUM 9.1   < > 8.7* 8.4* 7.8* 8.0* 9.0  --   --  8.6*  MG 1.8  --   --   --  2.4  --   --  2.1 2.2 2.5*  PHOS 6.2*  --  6.8*  --   --   --   --  10.8* 10.5* 10.9*   < > = values in this interval not displayed.    GFR: Estimated Creatinine Clearance: 9.9 mL/min (A) (by C-G formula based on SCr of 5.63 mg/dL (H)). Recent Labs  Lab 10/12/20 0422 10/12/20 1122 10/13/20 0814 10/14/20 0505 10/14/20 2322 10/14/20 2323  10/15/20 0253 10/16/20 0421 10/17/20 0423  PROCALCITON 0.34  --  0.65  --   --   --   --   --   --   WBC 14.7*   < > 17.6*   < >  --  18.3* 21.9* 16.3* 16.4*  LATICACIDVEN  --   --  1.0  --  6.1*  --  1.9  --   --    < > = values in this interval not displayed.     Liver Function Tests: Recent Labs  Lab 10/20/2020 1738  10/12/20 0422 10/13/20 0814 10/14/20 2322 10/15/20 0253  AST 39 24  --  133* 183*  ALT 43 34  --  101* 121*  ALKPHOS 82 76  --  83 97  BILITOT 0.4 0.5  --  0.7 1.2  PROT 7.0 6.4*  --  6.3* 6.5  ALBUMIN 3.3* 3.1* 2.8* 3.2* 3.3*    Recent Labs  Lab 10/25/2020 1738  LIPASE 37    No results for input(s): AMMONIA in the last 168 hours.  ABG    Component Value Date/Time   PHART 7.238 (L) 10/15/2020 0038   PCO2ART 42.4 10/15/2020 0038   PO2ART 167 (H) 10/15/2020 0038   HCO3 17.2 (L) 10/15/2020 0038   TCO2 36 03/04/2012 1749   ACIDBASEDEF 8.6 (H) 10/15/2020 0038   O2SAT 98.4 10/15/2020 0038      Coagulation Profile: Recent Labs  Lab 10/12/20 0422 10/15/20 0253  INR 1.1 1.2     Cardiac Enzymes: No results for input(s): CKTOTAL, CKMB, CKMBINDEX, TROPONINI in the last 168 hours.  HbA1C: Hemoglobin A1C  Date/Time Value Ref Range Status  09/29/2020 02:49 PM 7.2 (A) 4.0 - 5.6 % Final  06/29/2020 10:23 AM 8.9 (A) 4.0 - 5.6 % Final   Hgb A1c MFr Bld  Date/Time Value Ref Range Status  09/22/2020 01:13 PM 7.6 (H) 4.8 - 5.6 % Final    Comment:    (NOTE) Pre diabetes:          5.7%-6.4%  Diabetes:              >6.4%  Glycemic control for   <7.0% adults with diabetes   03/17/2020 04:24 PM 7.1 (H) 4.8 - 5.6 % Final    Comment:             Prediabetes: 5.7 - 6.4          Diabetes: >6.4          Glycemic control for adults with diabetes: <7.0     CBG: Recent Labs  Lab 10/17/20 0021 10/17/20 0300 10/17/20 0426 10/17/20 0728 10/17/20 1130  GLUCAP 212* 169* 189* 206* 243*    CRITICAL CARE Performed by: Kipp Brood   Total critical  care time: 40 minutes  Critical care time was exclusive of separately billable procedures and treating other patients.  Critical care was necessary to treat or prevent imminent or life-threatening deterioration.  Critical care was time spent personally by me on the following activities: development of treatment plan with patient and/or surrogate as well as nursing, discussions with consultants, evaluation of patient's response to treatment, examination of patient, obtaining history from patient or surrogate, ordering and performing treatments and interventions, ordering and review of laboratory studies, ordering and review of radiographic studies, pulse oximetry, re-evaluation of patient's condition and participation in multidisciplinary rounds.  Kipp Brood, MD [facility] ICU Physician [place]  Pager: [phone] After hours: [phone].

## 2020-10-18 ENCOUNTER — Inpatient Hospital Stay (HOSPITAL_COMMUNITY): Payer: HMO

## 2020-10-18 DIAGNOSIS — D631 Anemia in chronic kidney disease: Secondary | ICD-10-CM | POA: Diagnosis not present

## 2020-10-18 DIAGNOSIS — Z452 Encounter for adjustment and management of vascular access device: Secondary | ICD-10-CM

## 2020-10-18 DIAGNOSIS — N185 Chronic kidney disease, stage 5: Secondary | ICD-10-CM | POA: Diagnosis not present

## 2020-10-18 DIAGNOSIS — I469 Cardiac arrest, cause unspecified: Secondary | ICD-10-CM | POA: Diagnosis not present

## 2020-10-18 LAB — CULTURE, RESPIRATORY W GRAM STAIN: Culture: NORMAL

## 2020-10-18 LAB — BASIC METABOLIC PANEL
Anion gap: 19 — ABNORMAL HIGH (ref 5–15)
BUN: 145 mg/dL — ABNORMAL HIGH (ref 8–23)
CO2: 19 mmol/L — ABNORMAL LOW (ref 22–32)
Calcium: 8.6 mg/dL — ABNORMAL LOW (ref 8.9–10.3)
Chloride: 97 mmol/L — ABNORMAL LOW (ref 98–111)
Creatinine, Ser: 6.12 mg/dL — ABNORMAL HIGH (ref 0.44–1.00)
GFR, Estimated: 7 mL/min — ABNORMAL LOW (ref >60)
Glucose, Bld: 274 mg/dL — ABNORMAL HIGH (ref 70–99)
Potassium: 4.3 mmol/L (ref 3.5–5.1)
Sodium: 135 mmol/L (ref 135–145)

## 2020-10-18 LAB — CBC
HCT: 23.1 % — ABNORMAL LOW (ref 36.0–46.0)
HCT: 22.3 % — ABNORMAL LOW (ref 36.0–46.0)
Hemoglobin: 7.4 g/dL — ABNORMAL LOW (ref 12.0–15.0)
Hemoglobin: 7.2 g/dL — ABNORMAL LOW (ref 12.0–15.0)
MCH: 28.2 pg (ref 26.0–34.0)
MCH: 28.6 pg (ref 26.0–34.0)
MCHC: 32 g/dL (ref 30.0–36.0)
MCHC: 32.3 g/dL (ref 30.0–36.0)
MCV: 88.2 fL (ref 80.0–100.0)
MCV: 88.5 fL (ref 80.0–100.0)
Platelets: 266 10*3/uL (ref 150–400)
Platelets: 264 10*3/uL (ref 150–400)
RBC: 2.62 MIL/uL — ABNORMAL LOW (ref 3.87–5.11)
RBC: 2.52 MIL/uL — ABNORMAL LOW (ref 3.87–5.11)
RDW: 14.5 % (ref 11.5–15.5)
RDW: 14.6 % (ref 11.5–15.5)
WBC: 19.7 10*3/uL — ABNORMAL HIGH (ref 4.0–10.5)
WBC: 18.1 10*3/uL — ABNORMAL HIGH (ref 4.0–10.5)
nRBC: 0.4 % — ABNORMAL HIGH (ref 0.0–0.2)
nRBC: 0.4 % — ABNORMAL HIGH (ref 0.0–0.2)

## 2020-10-18 LAB — GLUCOSE, CAPILLARY
Glucose-Capillary: 192 mg/dL — ABNORMAL HIGH (ref 70–99)
Glucose-Capillary: 261 mg/dL — ABNORMAL HIGH (ref 70–99)
Glucose-Capillary: 275 mg/dL — ABNORMAL HIGH (ref 70–99)
Glucose-Capillary: 287 mg/dL — ABNORMAL HIGH (ref 70–99)
Glucose-Capillary: 307 mg/dL — ABNORMAL HIGH (ref 70–99)
Glucose-Capillary: 314 mg/dL — ABNORMAL HIGH (ref 70–99)

## 2020-10-18 MED ORDER — LIDOCAINE-PRILOCAINE 2.5-2.5 % EX CREA
1.0000 "application " | TOPICAL_CREAM | CUTANEOUS | Status: DC | PRN
  Filled 2020-10-18: qty 5

## 2020-10-18 MED ORDER — FUROSEMIDE 10 MG/ML IJ SOLN
10.0000 mg/h | INTRAVENOUS | Status: DC
  Filled 2020-10-18: qty 20

## 2020-10-18 MED ORDER — DILTIAZEM HCL 60 MG PO TABS
60.0000 mg | ORAL_TABLET | Freq: Three times a day (TID) | ORAL | Status: DC
  Administered 2020-10-18 – 2020-10-21 (×8): 60 mg
  Filled 2020-10-18 (×8): qty 1

## 2020-10-18 MED ORDER — SODIUM CHLORIDE 0.9 % IV SOLN: 100.0000 mL | INTRAVENOUS | Status: DC | PRN

## 2020-10-18 MED ORDER — CHLORHEXIDINE GLUCONATE CLOTH 2 % EX PADS: 6.0000 | MEDICATED_PAD | Freq: Every day | CUTANEOUS | Status: DC

## 2020-10-18 MED ORDER — SODIUM CHLORIDE 0.9% FLUSH: 10.0000 mL | Freq: Two times a day (BID) | INTRAVENOUS | Status: DC

## 2020-10-18 MED ORDER — METOLAZONE 5 MG PO TABS
10.0000 mg | ORAL_TABLET | Freq: Once | ORAL | Status: AC
  Administered 2020-10-18: 10 mg
  Filled 2020-10-18: qty 2

## 2020-10-18 MED ORDER — LIDOCAINE HCL (PF) 1 % IJ SOLN: 5.0000 mL | INTRAMUSCULAR | Status: DC | PRN

## 2020-10-18 MED ORDER — HEPARIN SODIUM (PORCINE) 1000 UNIT/ML DIALYSIS
1000.0000 [IU] | INTRAMUSCULAR | Status: DC | PRN
  Administered 2020-10-20: 1000 [IU] via INTRAVENOUS_CENTRAL
  Administered 2020-10-21: 1400 [IU] via INTRAVENOUS_CENTRAL
  Filled 2020-10-18 (×2): qty 1

## 2020-10-18 MED ORDER — SODIUM CHLORIDE 0.9% FLUSH: 10.0000 mL | INTRAVENOUS | Status: DC | PRN

## 2020-10-18 MED ORDER — ALTEPLASE 2 MG IJ SOLR: 2.0000 mg | Freq: Once | INTRAMUSCULAR | Status: DC | PRN

## 2020-10-18 MED ORDER — INSULIN ASPART 100 UNIT/ML IJ SOLN
6.0000 [IU] | INTRAMUSCULAR | Status: DC
  Administered 2020-10-18 – 2020-10-20 (×12): 6 [IU] via SUBCUTANEOUS

## 2020-10-18 MED ORDER — PENTAFLUOROPROP-TETRAFLUOROETH EX AERO
1.0000 | INHALATION_SPRAY | CUTANEOUS | Status: DC | PRN
Start: 2020-10-18 — End: 2020-10-30

## 2020-10-18 MED ORDER — DOCUSATE SODIUM 50 MG/5ML PO LIQD
100.0000 mg | Freq: Two times a day (BID) | ORAL | Status: DC
  Administered 2020-10-18 – 2020-10-30 (×16): 100 mg
  Filled 2020-10-18 (×17): qty 10

## 2020-10-18 MED ORDER — POLYETHYLENE GLYCOL 3350 17 G PO PACK
17.0000 g | PACK | Freq: Every day | ORAL | Status: DC
  Administered 2020-10-18 – 2020-10-30 (×9): 17 g
  Filled 2020-10-18 (×10): qty 1

## 2020-10-18 MED ORDER — FUROSEMIDE 10 MG/ML IJ SOLN
120.0000 mg | Freq: Once | INTRAVENOUS | Status: DC
  Filled 2020-10-18: qty 12

## 2020-10-18 MED FILL — Medication: Qty: 1 | Status: AC

## 2020-10-18 NOTE — Procedures (Signed)
Central Venous Catheter Insertion Procedure Note  Krista Strickland  [id number]  1950-03-29  Date:10/18/20  Time:1:31 PM   Provider Performing:Simeon Vera Shearon Stalls   Procedure: Insertion of Non-tunneled Central Venous Catheter(36556)with US guidance (96116)    Indication(s) Medication administration and Hemodialysis  Consent Risks of the procedure as well as the alternatives and risks of each were explained to the patient and/or caregiver.  Consent for the procedure was obtained and is signed in the bedside chart  Anesthesia Topical only with 1% lidocaine   Timeout Verified patient identification, verified procedure, site/side was marked, verified correct patient position, special equipment/implants available, medications/allergies/relevant history reviewed, required imaging and test results available.  Sterile Technique Maximal sterile technique including full sterile barrier drape, hand hygiene, sterile gown, sterile gloves, mask, hair covering, sterile ultrasound probe cover (if used).  Procedure Description Area of catheter insertion was cleaned with chlorhexidine and draped in sterile fashion.   With real-time ultrasound guidance a HD catheter was placed into the left internal jugular vein.  Nonpulsatile blood flow and easy flushing noted in all ports.  The catheter was sutured in place and sterile dressing applied.  Complications/Tolerance None; patient tolerated the procedure well. Chest X-ray is ordered to verify placement for internal jugular or subclavian cannulation.  Chest x-ray is not ordered for femoral cannulation.  EBL Minimal  Specimen(s) None   Montey Hora, PA - [facility] For pager details, please see AMION or use Epic chat  After 1900, please call [place] for cross coverage needs 10/18/2020, 1:32 PM

## 2020-10-18 NOTE — Progress Notes (Signed)
Inpatient Diabetes Program Recommendations  AACE/ADA: New Consensus Statement on Inpatient Glycemic Control (2015)  Target Ranges:  Prepandial:   less than 140 mg/dL      Peak postprandial:   less than 180 mg/dL (1-2 hours)      Critically ill patients:  140 - 180 mg/dL   Lab Results  Component Value Date   GLUCAP 275 (H) 10/18/2020   HGBA1C 7.2 (A) 09/29/2020    Review of Glycemic Control Results for Krista Strickland, Krista Strickland (MRN [id number]) as of 10/18/2020 09:14  Ref. Range 10/17/2020 20:04 10/17/2020 23:48 10/18/2020 04:22 10/18/2020 07:21  Glucose-Capillary Latest Ref Range: 70 - 99 mg/dL 207 (H) 195 (H) 314 (H) 275 (H)   Diabetes history: Type 2 DM Outpatient Diabetes medications: Novolin 70/30 105 units QAM Current orders for Inpatient glycemic control: Semglee 20 units QHS, Novolog 0-15 units Q4H Vital @ 50 ml/hr   Inpatient Diabetes Program Recommendations:     Consider adding Novolog 3 units Q4H (for tube feed coverage, tube stopped or held in the event tube feeds are stopped).    Thanks, Bronson Curb, MSN, RNC-OB Diabetes Coordinator [phone] (8a-5p)

## 2020-10-18 NOTE — Progress Notes (Signed)
eLink Physician-Brief Progress Note Patient Name: Krista Strickland DOB: [birth year] MRN: [id number]   Date of Service  10/18/2020  HPI/Events of Note  Pt spontaneously became tachypneic, more hypoxic, and wheezy within the last hour.  No clear trigger.    No ETT secretions, no changed with suctioning or duoneb, and no improvement with prn bolus of sedative.  PIP = 18; rr up to 28 (was previously in sync with vent set to 18)  eICU Interventions  RN to give another bolus of fentanyl  Might need to start her on propofol.  Stat CXR ordered     Intervention Category Intermediate Interventions: Respiratory distress - evaluation and management  Tilden Dome 10/18/2020, 4:09 AM

## 2020-10-18 NOTE — Progress Notes (Addendum)
NAME:  Krista Strickland, MRN:  [id number], DOB:  20-Apr-1950, LOS: 6 ADMISSION DATE:  10/05/2020, CONSULTATION DATE: 10/15/2020 REFERRING MD: Dr. Roger Shelter, CHIEF COMPLAINT: Cardiac arrest  History of Present Illness:  Krista Strickland is a 70 y.o. female with a past medical history significant for CKD stage V, hypertension, type 2 diabetes, GERD, hyperlipidemia, and hypertrophic cardiomyopathy who presented to the emergency department) with left lower quadrant abdominal pain that began day of admission.  Of note patient had renal biopsy 10/07/2020.  On ED arrival patient was seen hypertensive with all other vital signs within normal limits.  Due to abdominal pain complaints with recent liver biopsy CT abdomen pelvis was obtained and revealed 8 x 4 x 12 cm left present subscapular hematoma for which intervention radiology was consulted and recommended monitoring but if symptoms worsen and arteriogram could be considered.  Late evening of 9/16 patient suffered cardiac arrest.  After returning from the restroom patient had episode dyspnea followed by bradycardia and later asystole.  She received 1 mg of epi with ACLS/CPR with ROSC achieved and was seen with spontaneous respirations but significantly diminished mentation resulting in decision to endotracheally intubate.  Given sudden onset of dyspnea post ambulation with resultant bradycardia concern for PE as cause of cardiac arrest was very high given renal function decision was made to forego CTA and began therapeutic heparin.  Heparin drip was later discontinued due to downtrending of hemoglobin.  PCCM was consulted for further management and decision was made to transfer patient to [facility] for high-level care.  Significant Hospital Events: Including procedures, antibiotic start and stop dates in addition to other pertinent events   9/13 admitted to [facility] with abdominal pain found to have a subcapsular left renal hematoma post biopsy 9/16 cardiac  arrest likely due to aspiration 9/18 passed SBT but copious secretion.   Interim History / Subjective:  Tachypnea over night to high 20s-30 and O2 sat dropped to 84%. No improvement with PRN propofol. Secretions suctioned, given albuterol neb, and increased FiO2 to 100% with improvement in O2 sat. This morning, FiO2 down to 80% and O2 sats remain in mid-high 90s.  Objective   Blood pressure (!) 161/56, pulse 78, temperature 97.7 F (36.5 C), temperature source Axillary, resp. rate (!) 25, height 5\' 4"  (1.626 m), weight 75.6 kg, SpO2 95 %.    Vent Mode: PRVC FiO2 (%):  [40 %-100 %] 80 % Set Rate:  [18 bmp] 18 bmp Vt Set:  [440 mL] 440 mL PEEP:  [5 cmH20] 5 cmH20 Plateau Pressure:  [14 cmH20-15 cmH20] 15 cmH20   Intake/Output Summary (Last 24 hours) at 10/18/2020 1105 Last data filed at 10/18/2020 0900 Gross per 24 hour  Intake 1098.41 ml  Output 200 ml  Net 898.41 ml   Filed Weights   10/16/20 0413 10/17/20 0500 10/18/20 0500  Weight: 75.8 kg 86.4 kg 75.6 kg    Examination: General: Acute on chronically ill appearing elderly female, lying in the bed, orally intubated  HEENT: Atraumatic, normocephalic, ETT, MM pink/moist Neuro: Opens eyes with vocal stimuli, moving all 4 extremities CV: regular rate and rhythm, no murmur, rubs, or gallops,  PULM: Diffuse end expiratory wheezing GI: soft, distended, hypoactive in all 4 quadrants Extremities: warm/dry, no pitting edema  Skin: no rashes or lesions  Resolved Hospital Problem list     Assessment & Plan:  S/p PEA cardiac arrest due to hypoxia  Critically ill due to acute hypoxic respiratory failure Worsening pulmonary edema/volume overload  evident on 9/20 CXR and wheezing on exam. Primary driver of volume overload is worsening renal function, as Cr is now 6. Will give dose of Lasix and metolazone for sequential nephron blockade, and start Lasix drip. Patient will need dialysis to improve volume status.  -Lasix 120 mg + metolazone  10 mg  -Lasix drip - 10 mg/hr  -Start hemodialysis -Stop diuretics once hemodialysis initiated  AKI CKD stage V now CKD stage Vd High anion gap metabolic acidosis due to kidney disease Renal function is worsening given increase in Cr (6.12) and BUN (145), which is causing volume overload and driving hypoxic respiratory failure.  -Diuresis (as above)  -Start hemodialysis  -Nephrology followng  Type 2 diabetes Given BG remains elevated, will add tube feed coverage.  -Semglee 20 Units -Novolog SSI  -Added Novolog 6 Units every 6 hours for tube feed coverage  Septic shock due to aspiration pneumonia - now resolved -Continue on Unasyn 1.5 mg q12h (day 4, stop date 9/21)  Perinephric hematoma post renal biopsy -Hemoglobin is stable at 7.4  Chronic hyponatremia Sodium has normalized, likely due to sodium load from Unasyn  HOCM Demand cardiac ischemia Hypertension Hyperlipidemia Anemia of CKD coupled with acute blood loss anemia Hypoalbuminemia with mild protein calorie malnutrition  Best Practice    Diet/type: tubefeeds DVT prophylaxis: SCD GI prophylaxis: PPI Lines: Central line Foley:  Yes, and it is still needed Code Status:  full code Last date of multidisciplinary goals of care discussion: pending  Labs   CBC: Recent Labs  Lab 10/17/2020 1738 10/12/20 0422 10/14/20 2323 10/15/20 0253 10/16/20 0421 10/17/20 0423 10/18/20 0305  WBC 15.4*   < > 18.3* 21.9* 16.3* 16.4* 19.7*  NEUTROABS 12.7*  --   --   --   --   --   --   HGB 11.8*   < > 7.8* 8.5* 7.2* 7.8* 7.4*  HCT 35.7*   < > 25.2* 25.7* 22.4* 23.4* 23.1*  MCV 88.1   < > 93.3 88.3 88.2 86.3 88.2  PLT 270   < > 228 245 208 232 266   < > = values in this interval not displayed.    Basic Metabolic Panel: Recent Labs  Lab 10/13/20 0814 10/14/20 0505 10/14/20 2322 10/15/20 0253 10/16/20 0421 10/16/20 1015 10/16/20 1723 10/17/20 0423 10/17/20 1650 10/18/20 0650  NA 124*   < > 124* 126* 130*  --   --   131*  --  135  K 5.0   < > 5.5* 5.1 4.2  --   --  3.9  --  4.3  CL 94*   < > 90* 90* 93*  --   --  95*  --  97*  CO2 20*   < > 18* 23 20*  --   --  16*  --  19*  GLUCOSE 161*   < > 265* 172* 124*  --   --  188*  --  274*  BUN 97*   < > 102* 101* 104*  --   --  122*  --  145*  CREATININE 4.66*   < > 5.32* 5.56* 5.57*  --   --  5.63*  --  6.12*  CALCIUM 8.7*   < > 7.8* 8.0* 9.0  --   --  8.6*  --  8.6*  MG  --   --  2.4  --   --  2.1 2.2 2.5* 2.5*  --   PHOS 6.8*  --   --   --   --  10.8* 10.5* 10.9* 10.4*  --    < > = values in this interval not displayed.   GFR: Estimated Creatinine Clearance: 9.3 mL/min (A) (by C-G formula based on SCr of 5.63 mg/dL (H)). Recent Labs  Lab 10/12/20 0422 10/12/20 1122 10/13/20 0814 10/14/20 0505 10/14/20 2322 10/14/20 2323 10/15/20 0253 10/16/20 0421 10/17/20 0423 10/18/20 0305  PROCALCITON 0.34  --  0.65  --   --   --   --   --   --   --   WBC 14.7*   < > 17.6*   < >  --    < > 21.9* 16.3* 16.4* 19.7*  LATICACIDVEN  --   --  1.0  --  6.1*  --  1.9  --   --   --    < > = values in this interval not displayed.     Liver Function Tests: Recent Labs  Lab 10/09/2020 1738 10/12/20 0422 10/13/20 0814 10/14/20 2322 10/15/20 0253  AST 39 24  --  133* 183*  ALT 43 34  --  101* 121*  ALKPHOS 82 76  --  83 97  BILITOT 0.4 0.5  --  0.7 1.2  PROT 7.0 6.4*  --  6.3* 6.5  ALBUMIN 3.3* 3.1* 2.8* 3.2* 3.3*    Recent Labs  Lab 10/12/2020 1738  LIPASE 37    No results for input(s): AMMONIA in the last 168 hours.  ABG    Component Value Date/Time   PHART 7.238 (L) 10/15/2020 0038   PCO2ART 42.4 10/15/2020 0038   PO2ART 167 (H) 10/15/2020 0038   HCO3 17.2 (L) 10/15/2020 0038   TCO2 36 03/04/2012 1749   ACIDBASEDEF 8.6 (H) 10/15/2020 0038   O2SAT 98.4 10/15/2020 0038      Coagulation Profile: Recent Labs  Lab 10/12/20 0422 10/15/20 0253  INR 1.1 1.2     Cardiac Enzymes: No results for input(s): CKTOTAL, CKMB, CKMBINDEX, TROPONINI  in the last 168 hours.  HbA1C: Hemoglobin A1C  Date/Time Value Ref Range Status  09/29/2020 02:49 PM 7.2 (A) 4.0 - 5.6 % Final  06/29/2020 10:23 AM 8.9 (A) 4.0 - 5.6 % Final   Hgb A1c MFr Bld  Date/Time Value Ref Range Status  09/22/2020 01:13 PM 7.6 (H) 4.8 - 5.6 % Final    Comment:    (NOTE) Pre diabetes:          5.7%-6.4%  Diabetes:              >6.4%  Glycemic control for   <7.0% adults with diabetes   03/17/2020 04:24 PM 7.1 (H) 4.8 - 5.6 % Final    Comment:             Prediabetes: 5.7 - 6.4          Diabetes: >6.4          Glycemic control for adults with diabetes: <7.0     CBG: Recent Labs  Lab 10/17/20 1517 10/17/20 2004 10/17/20 2348 10/18/20 0422 10/18/20 [place]    Jacqlyn Larsen, MS4

## 2020-10-18 NOTE — Progress Notes (Signed)
Patient's spo2 decreased to 84%. RN called RT to assess patient. RT suctioned patient and increased fio2 to 100%. Patient has expiratory wheezes bilaterally. RT gave albuterol treatment at this time. Pt's spo2 is currently 94%. RT will continue to monitor as needed.

## 2020-10-18 NOTE — Plan of Care (Signed)
Care plan reviewed.

## 2020-10-18 NOTE — Progress Notes (Signed)
Patient had an episode of desaturation  into the 80s as well as wheezing and increased respiratory rate into the 30s. Respiratory was notified and chest x ray was ordered. Tube feed was paused per MD request to avoid any possible aspiration until chest x ray was obtained.

## 2020-10-18 NOTE — Plan of Care (Signed)
  Problem: Education: Goal: Knowledge of disease and its progression will improve Outcome: Progressing Education provided to sister in law via phone

## 2020-10-18 NOTE — Progress Notes (Signed)
[facility] ROUNDING NOTE   Subjective:   Interval History: 70 year old lady with stage IV chronic kidney disease diabetes mellitus type 2 hyperlipidemia hypertrophic cardiomyopathy presents to the emergency department left lower quadrant pain status postbiopsy 10/07/2020.  Was found to have 8 x 4 x 12 cm subcapsular hematoma.  Late evening 10/15/2018.  With cardiac arrest received 1 mg of epi with ACLS/CPR with ROSC achieved.  Concerns for pulmonary embolus.  Heparin initiated.  Transferred from [facility] to [facility].  Urine output 700 cc 10/17/2020.  Significant weight increase since admission.  Chest x-ray left pleural effusion with increased interstitial and airspace densities throughout the  Some wheezing noted, tachypnea and tachycardia  Blood pressure 161/56 pulse 79 temperature 97.7 O2 sats 96% FiO2 80%  Sodium 135 potassium 4.3 chloride 97 CO2 19 BUN 145 creatinine 6 glucose 274 calcium 8.6 hemoglobin 7.4.    Objective:  Vital signs in last 24 hours:  Temp:  [97.6 F (36.4 C)-100.7 F (38.2 C)] 97.7 F (36.5 C) (09/20 0726) Pulse Rate:  [56-211] 78 (09/20 1000) Resp:  [12-33] 25 (09/20 1000) BP: (113-190)/(6-116) 161/56 (09/20 1000) SpO2:  [88 %-100 %] 95 % (09/20 1000) FiO2 (%):  [40 %-100 %] 80 % (09/20 0739) Weight:  [75.6 kg] 75.6 kg (09/20 0500)  Weight change: -10.8 kg Filed Weights   10/16/20 0413 10/17/20 0500 10/18/20 0500  Weight: 75.8 kg 86.4 kg 75.6 kg    Intake/Output: I/O last 3 completed shifts: In: 1785.4 [I.V.:765.5; NG/GT:720; IV Piggyback:299.9] Out: 800 [Urine:800]   Intake/Output this shift:  Total I/O In: 69.9 [I.V.:69.9] Out: -   General:intubated, sedated Heart:RRR Lungs:intuabted, bl chest expansion, no crackles Abdomen:soft, Non-tender, non-distended Extremities: Bilateral ankle edema present+ Neuro: sedated   Basic Metabolic Panel: Recent Labs  Lab 10/13/20 0814 10/14/20 0505 10/14/20 2322  10/15/20 0253 10/16/20 0421 10/16/20 1015 10/16/20 1723 10/17/20 0423 10/17/20 1650 10/18/20 0650  NA 124*   < > 124* 126* 130*  --   --  131*  --  135  K 5.0   < > 5.5* 5.1 4.2  --   --  3.9  --  4.3  CL 94*   < > 90* 90* 93*  --   --  95*  --  97*  CO2 20*   < > 18* 23 20*  --   --  16*  --  19*  GLUCOSE 161*   < > 265* 172* 124*  --   --  188*  --  274*  BUN 97*   < > 102* 101* 104*  --   --  122*  --  145*  CREATININE 4.66*   < > 5.32* 5.56* 5.57*  --   --  5.63*  --  6.12*  CALCIUM 8.7*   < > 7.8* 8.0* 9.0  --   --  8.6*  --  8.6*  MG  --   --  2.4  --   --  2.1 2.2 2.5* 2.5*  --   PHOS 6.8*  --   --   --   --  10.8* 10.5* 10.9* 10.4*  --    < > = values in this interval not displayed.     Liver Function Tests: Recent Labs  Lab 10/25/2020 1738 10/12/20 0422 10/13/20 0814 10/14/20 2322 10/15/20 0253  AST 39 24  --  133* 183*  ALT 43 34  --  101* 121*  ALKPHOS 82 76  --  83 97  BILITOT 0.4 0.5  --  0.7 1.2  PROT 7.0 6.4*  --  6.3* 6.5  ALBUMIN 3.3* 3.1* 2.8* 3.2* 3.3*    Recent Labs  Lab 10/10/2020 1738  LIPASE 37    No results for input(s): AMMONIA in the last 168 hours.  CBC: Recent Labs  Lab 10/17/2020 1738 10/12/20 0422 10/14/20 2323 10/15/20 0253 10/16/20 0421 10/17/20 0423 10/18/20 0305  WBC 15.4*   < > 18.3* 21.9* 16.3* 16.4* 19.7*  NEUTROABS 12.7*  --   --   --   --   --   --   HGB 11.8*   < > 7.8* 8.5* 7.2* 7.8* 7.4*  HCT 35.7*   < > 25.2* 25.7* 22.4* 23.4* 23.1*  MCV 88.1   < > 93.3 88.3 88.2 86.3 88.2  PLT 270   < > 228 245 208 232 266   < > = values in this interval not displayed.     Cardiac Enzymes: No results for input(s): CKTOTAL, CKMB, CKMBINDEX, TROPONINI in the last 168 hours.  BNP: Invalid input(s): POCBNP  CBG: Recent Labs  Lab 10/17/20 1517 10/17/20 2004 10/17/20 2348 10/18/20 0422 10/18/20 0721  GLUCAP 230* 207* 195* 314* 275*     Microbiology: Results for orders placed or performed during the hospital encounter  of 10/05/2020  Resp Panel by RT-PCR (Flu A&B, Covid) Nasopharyngeal Swab     Status: None   Collection Time: 10/12/2020  9:07 PM   Specimen: Nasopharyngeal Swab; Nasopharyngeal(NP) swabs in vial transport medium  Result Value Ref Range Status   SARS Coronavirus 2 by RT PCR NEGATIVE NEGATIVE Final    Comment: (NOTE) SARS-CoV-2 target nucleic acids are NOT DETECTED.  The SARS-CoV-2 RNA is generally detectable in upper respiratory specimens during the acute phase of infection. The lowest concentration of SARS-CoV-2 viral copies this assay can detect is 138 copies/mL. A negative result does not preclude SARS-Cov-2 infection and should not be used as the sole basis for treatment or other patient management decisions. A negative result may occur with  improper specimen collection/handling, submission of specimen other than nasopharyngeal swab, presence of viral mutation(s) within the areas targeted by this assay, and inadequate number of viral copies(<138 copies/mL). A negative result must be combined with clinical observations, patient history, and epidemiological information. The expected result is Negative.  Fact Sheet for Patients:  EntrepreneurPulse.com.au  Fact Sheet for Healthcare Providers:  IncredibleEmployment.be  This test is no t yet approved or cleared by the [place] FDA and  has been authorized for detection and/or diagnosis of SARS-CoV-2 by FDA under an Emergency Use Authorization (EUA). This EUA will remain  in effect (meaning this test can be used) for the duration of the COVID-19 declaration under Section 564(b)(1) of the Act, 21 U.S.C.section 360bbb-3(b)(1), unless the authorization is terminated  or revoked sooner.       Influenza A by PCR NEGATIVE NEGATIVE Final   Influenza B by PCR NEGATIVE NEGATIVE Final    Comment: (NOTE) The Xpert Xpress SARS-CoV-2/FLU/RSV plus assay is intended as an aid in the diagnosis of influenza  from Nasopharyngeal swab specimens and should not be used as a sole basis for treatment. Nasal washings and aspirates are unacceptable for Xpert Xpress SARS-CoV-2/FLU/RSV testing.  Fact Sheet for Patients: EntrepreneurPulse.com.au  Fact Sheet for Healthcare Providers: IncredibleEmployment.be  This test is not yet approved or cleared by the [place] FDA and has been authorized for detection and/or diagnosis of SARS-CoV-2 by FDA under an Emergency Use Authorization (  EUA). This EUA will remain in effect (meaning this test can be used) for the duration of the COVID-19 declaration under Section 564(b)(1) of the Act, 21 U.S.C. section 360bbb-3(b)(1), unless the authorization is terminated or revoked.  Performed at [facility], [address] [place], [place] [address]   MRSA Next Gen by PCR, Nasal     Status: None   Collection Time: 10/12/20  5:00 PM   Specimen: Nasal Mucosa; Nasal Swab  Result Value Ref Range Status   MRSA by PCR Next Gen NOT DETECTED NOT DETECTED Final    Comment: (NOTE) The GeneXpert MRSA Assay (FDA approved for NASAL specimens only), is one component of a comprehensive MRSA colonization surveillance program. It is not intended to diagnose MRSA infection nor to guide or monitor treatment for MRSA infections. Test performance is not FDA approved in patients less than 68 years old. Performed at [facility], [address], [place], [place] [address]   Urine Culture     Status: None   Collection Time: 10/15/20  4:51 AM   Specimen: Urine, Catheterized  Result Value Ref Range Status   Specimen Description   Final    URINE, CATHETERIZED Performed at [facility], [address], [place], [place] [address]    Special Requests   Final    NONE Performed at [facility], [address], [place], [place] [address]    Culture   Final    NO GROWTH Performed at [facility], [place] [address], [place], [place] [address]     Report Status 10/16/2020 FINAL  Final  Culture, blood (x 2)     Status: None (Preliminary result)   Collection Time: 10/15/20  5:02 AM   Specimen: BLOOD LEFT WRIST  Result Value Ref Range Status   Specimen Description   Final    BLOOD LEFT WRIST BOTTLES DRAWN AEROBIC AND ANAEROBIC   Special Requests Blood Culture adequate volume  Final   Culture   Final    NO GROWTH 3 DAYS Performed at [facility], [address], [place], [place] [address]    Report Status PENDING  Incomplete  Culture, blood (x 2)     Status: None (Preliminary result)   Collection Time: 10/15/20  5:13 AM   Specimen: BLOOD LEFT ARM  Result Value Ref Range Status   Specimen Description BLOOD LEFT ARM BOTTLES DRAWN AEROBIC AND ANAEROBIC  Final   Special Requests Blood Culture adequate volume  Final   Culture   Final    NO GROWTH 3 DAYS Performed at [facility], [address], [place], [place] [address]    Report Status PENDING  Incomplete  Culture, Respiratory w Gram Stain     Status: None   Collection Time: 10/16/20  8:52 AM   Specimen: Tracheal Aspirate; Respiratory  Result Value Ref Range Status   Specimen Description TRACHEAL ASPIRATE  Final   Special Requests NONE  Final   Gram Stain   Final    RARE WBC PRESENT, PREDOMINANTLY PMN RARE GRAM POSITIVE COCCI IN PAIRS    Culture   Final    RARE Normal respiratory flora-no Staph aureus or Pseudomonas seen Performed at [facility], [address], [place], [place] [address]    Report Status 10/18/2020 FINAL  Final    Coagulation Studies: No results for input(s): LABPROT, INR in the last 72 hours.   Urinalysis: No results for input(s): COLORURINE, LABSPEC, PHURINE, GLUCOSEU, HGBUR, BILIRUBINUR, KETONESUR, PROTEINUR, UROBILINOGEN, NITRITE, LEUKOCYTESUR in the last  72 hours.  Invalid input(s): APPERANCEUR     Imaging: DG CHEST PORT 1 VIEW  Result Date: 10/18/2020 CLINICAL DATA:  Hypoxia. EXAM: PORTABLE CHEST 1 VIEW COMPARISON:  10/15/2020  FINDINGS: The ET tube tip terminates above the carina. NG tube is again noted with tip terminating below the GE junction. Stable cardiomediastinal contours. Interval increase in volume of left pleural effusion. Increased diffuse interstitial and airspace densities throughout both lungs. Visualized osseous structures are unremarkable. IMPRESSION: 1. Increase in volume of left pleural effusion. 2. Increased diffuse interstitial and airspace densities throughout both lungs. Electronically Signed   By: Kerby Moors M.D.   On: 10/18/2020 06:07     Medications:    sodium chloride 5 mL/hr at 10/18/20 0900   ampicillin-sulbactam (UNASYN) IV Stopped (10/17/20 2221)   dexmedetomidine (PRECEDEX) IV infusion 1 mcg/kg/hr (10/18/20 0900)   feeding supplement (VITAL 1.5 CAL) 50 mL/hr at 10/18/20 0813   furosemide     furosemide (LASIX) 200 mg in dextrose 5% 100 mL (23m/mL) infusion     phenylephrine (NEO-SYNEPHRINE) Adult infusion      calcitRIOL  0.25 mcg Per NG tube Daily   chlorhexidine gluconate (MEDLINE KIT)  15 mL Mouth Rinse BID   Chlorhexidine Gluconate Cloth  6 each Topical Daily   diltiazem  60 mg Per Tube Q8H   ezetimibe  10 mg Per Tube Daily   febuxostat  40 mg Per NG tube Daily   feeding supplement (PROSource TF)  45 mL Per Tube BID   gabapentin  200 mg Per Tube Q12H   hydrALAZINE  50 mg Per Tube Q8H   influenza vaccine adjuvanted  0.5 mL Intramuscular Tomorrow-1000   insulin aspart  0-15 Units Subcutaneous Q4H   insulin aspart  6 Units Subcutaneous Q4H   insulin glargine-yfgn  20 Units Subcutaneous QHS   mouth rinse  15 mL Mouth Rinse 10 times per day   metolazone  10 mg Per Tube Once   metoprolol tartrate  75 mg Per NG tube BID   pantoprazole sodium  40 mg Per Tube Daily   pravastatin  20 mg Per NG tube QAC breakfast   prednisoLONE acetate  1 drop Left Eye BID   sodium bicarbonate  650 mg Per NG tube TID   albuterol, fentaNYL (SUBLIMAZE) injection, hydrALAZINE,  HYDROcodone-acetaminophen, ondansetron (ZOFRAN) IV  Assessment/ Plan:  1.Acute kidney injury on CKD stage IV: Patient has longstanding CKD with baseline creatinine level seems to be around 2.6-2.9 however recently the GFR declined and creatinine level around 3.6 as outpatient.  Reportedly the biopsy result came back hypertensive and diabetic changes with severe interstitial fibrosis and tubular atrophy with mostly chronic changes.  -Discussed with critical care medicine.  Will initiate hemodialysis 10/18/2020 patient is clearly volume overloaded with significant azotemia.  Would be able to stop diuretics once initiated on dialysis.  Critical care medicine to place temporary dialysis catheter 10/18/2020     2 Post biopsy left kidney hematoma: 8 x 4 x 12 cm subcapsular hematoma along the left kidney leading to mass-effect on the renal parenchyma.  IR was consulted, no plan for embolization.  Continue to monitor CBC and imaging studies. -Transfuse as necessary.   3 Cardiac arrest 9/16-asystole -transferred from [facility] post arrest -brief course of CPR, 1 round of epi     4 Hyponatremia, resolved at this point  6 Metabolic acidosis: Initiating hemodialysis should be able to stop oral sodium bicarbonate   7 Hypertension, now hypotensive: pressor support per primary service  8 Anemia of CKD and blood loss postbiopsy: Monitor CBC and transfuse as needed.   LOS: [place] '@TODAY' '@10' :29 AM

## 2020-10-19 DIAGNOSIS — D631 Anemia in chronic kidney disease: Secondary | ICD-10-CM | POA: Diagnosis not present

## 2020-10-19 DIAGNOSIS — N185 Chronic kidney disease, stage 5: Secondary | ICD-10-CM | POA: Diagnosis not present

## 2020-10-19 DIAGNOSIS — Z452 Encounter for adjustment and management of vascular access device: Secondary | ICD-10-CM | POA: Diagnosis not present

## 2020-10-19 LAB — RENAL FUNCTION PANEL
Albumin: 2.1 g/dL — ABNORMAL LOW (ref 3.5–5.0)
Anion gap: 16 — ABNORMAL HIGH (ref 5–15)
BUN: 99 mg/dL — ABNORMAL HIGH (ref 8–23)
CO2: 24 mmol/L (ref 22–32)
Calcium: 8.9 mg/dL (ref 8.9–10.3)
Chloride: 98 mmol/L (ref 98–111)
Creatinine, Ser: 4.3 mg/dL — ABNORMAL HIGH (ref 0.44–1.00)
GFR, Estimated: 11 mL/min — ABNORMAL LOW (ref >60)
Glucose, Bld: 238 mg/dL — ABNORMAL HIGH (ref 70–99)
Phosphorus: 6.7 mg/dL — ABNORMAL HIGH (ref 2.5–4.6)
Potassium: 3.8 mmol/L (ref 3.5–5.1)
Sodium: 138 mmol/L (ref 135–145)

## 2020-10-19 LAB — HEPATITIS B SURFACE ANTIGEN: Hepatitis B Surface Ag: NONREACTIVE

## 2020-10-19 LAB — CBC
HCT: 23.8 % — ABNORMAL LOW (ref 36.0–46.0)
Hemoglobin: 7.7 g/dL — ABNORMAL LOW (ref 12.0–15.0)
MCH: 28.7 pg (ref 26.0–34.0)
MCHC: 32.4 g/dL (ref 30.0–36.0)
MCV: 88.8 fL (ref 80.0–100.0)
Platelets: 301 10*3/uL (ref 150–400)
RBC: 2.68 MIL/uL — ABNORMAL LOW (ref 3.87–5.11)
RDW: 14.6 % (ref 11.5–15.5)
WBC: 20.8 10*3/uL — ABNORMAL HIGH (ref 4.0–10.5)
nRBC: 1.7 % — ABNORMAL HIGH (ref 0.0–0.2)

## 2020-10-19 LAB — HEPATITIS B SURFACE ANTIBODY,QUALITATIVE: Hep B S Ab: REACTIVE — AB

## 2020-10-19 LAB — GLUCOSE, CAPILLARY
Glucose-Capillary: 236 mg/dL — ABNORMAL HIGH (ref 70–99)
Glucose-Capillary: 274 mg/dL — ABNORMAL HIGH (ref 70–99)
Glucose-Capillary: 314 mg/dL — ABNORMAL HIGH (ref 70–99)
Glucose-Capillary: 273 mg/dL — ABNORMAL HIGH (ref 70–99)
Glucose-Capillary: 294 mg/dL — ABNORMAL HIGH (ref 70–99)
Glucose-Capillary: 264 mg/dL — ABNORMAL HIGH (ref 70–99)

## 2020-10-19 MED ORDER — CALCITRIOL 1 MCG/ML PO SOLN
0.2500 ug | Freq: Every day | ORAL | Status: DC
  Administered 2020-10-19 – 2020-10-30 (×12): 0.25 ug
  Filled 2020-10-19 (×14): qty 0.25

## 2020-10-19 MED ORDER — HEPARIN SODIUM (PORCINE) 1000 UNIT/ML DIALYSIS: 1000.0000 [IU] | INTRAMUSCULAR | Status: DC | PRN

## 2020-10-19 MED ORDER — PENTAFLUOROPROP-TETRAFLUOROETH EX AERO: 1.0000 "application " | INHALATION_SPRAY | CUTANEOUS | Status: DC | PRN

## 2020-10-19 MED ORDER — HEPARIN SODIUM (PORCINE) 5000 UNIT/ML IJ SOLN
5000.0000 [IU] | Freq: Three times a day (TID) | INTRAMUSCULAR | Status: DC
  Administered 2020-10-19 – 2020-10-30 (×33): 5000 [IU] via SUBCUTANEOUS
  Filled 2020-10-19 (×33): qty 1

## 2020-10-19 MED ORDER — SODIUM CHLORIDE 0.9 % IV SOLN: 100.0000 mL | INTRAVENOUS | Status: DC | PRN

## 2020-10-19 MED ORDER — LIDOCAINE HCL (PF) 1 % IJ SOLN: 5.0000 mL | INTRAMUSCULAR | Status: DC | PRN

## 2020-10-19 MED ORDER — ALBUTEROL SULFATE (2.5 MG/3ML) 0.083% IN NEBU
2.5000 mg | INHALATION_SOLUTION | Freq: Two times a day (BID) | RESPIRATORY_TRACT | Status: DC
  Administered 2020-10-19 – 2020-10-20 (×2): 2.5 mg via RESPIRATORY_TRACT
  Filled 2020-10-19 (×2): qty 3

## 2020-10-19 MED ORDER — SODIUM CHLORIDE 0.9% FLUSH
10.0000 mL | INTRAVENOUS | Status: DC | PRN
  Administered 2020-10-20: 30 mL

## 2020-10-19 MED ORDER — ALTEPLASE 2 MG IJ SOLR: 2.0000 mg | Freq: Once | INTRAMUSCULAR | Status: DC | PRN

## 2020-10-19 MED ORDER — INSULIN GLARGINE-YFGN 100 UNIT/ML ~~LOC~~ SOLN
30.0000 [IU] | Freq: Every day | SUBCUTANEOUS | Status: DC
  Administered 2020-10-19 – 2020-10-24 (×6): 30 [IU] via SUBCUTANEOUS
  Filled 2020-10-19 (×7): qty 0.3

## 2020-10-19 MED ORDER — LIDOCAINE-PRILOCAINE 2.5-2.5 % EX CREA: 1.0000 "application " | TOPICAL_CREAM | CUTANEOUS | Status: DC | PRN

## 2020-10-19 MED ORDER — SODIUM CHLORIDE 0.9% FLUSH
10.0000 mL | Freq: Two times a day (BID) | INTRAVENOUS | Status: DC
  Administered 2020-10-19 – 2020-10-21 (×4): 10 mL
  Administered 2020-10-22: 20 mL
  Administered 2020-10-22 – 2020-10-29 (×13): 10 mL

## 2020-10-19 NOTE — Progress Notes (Signed)
Inpatient Diabetes Program Recommendations  AACE/ADA: New Consensus Statement on Inpatient Glycemic Control (2015)  Target Ranges:  Prepandial:   less than 140 mg/dL      Peak postprandial:   less than 180 mg/dL (1-2 hours)      Critically ill patients:  140 - 180 mg/dL   Lab Results  Component Value Date   GLUCAP 274 (H) 10/19/2020   HGBA1C 7.2 (A) 09/29/2020    Review of Glycemic Control Results for Krista Strickland, Krista Strickland (MRN [id number]) as of 10/19/2020 08:10  Ref. Range 10/18/2020 15:15 10/18/2020 19:45 10/18/2020 23:51 10/19/2020 03:21 10/19/2020 07:28  Glucose-Capillary Latest Ref Range: 70 - 99 mg/dL 307 (H) 192 (H) 261 (H) 236 (H) 274 (H)   Diabetes history: Type 2 DM Outpatient Diabetes medications: Novolin 70/30 105 units QAM Current orders for Inpatient glycemic control: Semglee 20 units QHS, Novolog 0-15 units Q4H, Novolog 6 units Q4H Vital @ 50 ml/hr   Inpatient Diabetes Program Recommendations:     Consider increasing Semglee to 28 units QD and increasing Novolog 8 units Q4H (for tube feed coverage, tube stopped or held in the event tube feeds are stopped).   Thanks, Bronson Curb, MSN, RNC-OB Diabetes Coordinator [phone] (8a-5p)

## 2020-10-19 NOTE — Progress Notes (Signed)
[facility] ROUNDING NOTE   Subjective:   Interval History: 70 year old lady with stage IV chronic kidney disease diabetes mellitus type 2 hyperlipidemia hypertrophic cardiomyopathy presents to the emergency department left lower quadrant pain status postbiopsy 10/07/2020.  Was found to have 8 x 4 x 12 cm subcapsular hematoma.  Late evening 10/15/2018.  With cardiac arrest received 1 mg of epi with ACLS/CPR with ROSC achieved.  Concerns for pulmonary embolus.  Heparin initiated.  Transferred from [facility] to [facility].    Urine output 700 cc 10/18/2020 underwent dialysis 10/19/2020 with 1.5 L removed  Chest x-ray left pleural effusion with increased interstitial and airspace densities   Blood pressure 181/66 pulse 88 temperature 99 O2 sats 90% FiO2 50%  Sodium 138 potassium 3.8 chloride 98 CO2 24 BUN 99 creatinine 4.3 glucose 238 calcium 8.9 phosphorus 6.7 hemoglobin 7.7    Objective:  Vital signs in last 24 hours:  Temp:  [98.5 F (36.9 C)-100.1 F (37.8 C)] 99 F (37.2 C) (09/21 0729) Pulse Rate:  [56-93] 83 (09/21 0800) Resp:  [17-34] 27 (09/21 0800) BP: (103-185)/(52-77) 173/63 (09/21 0800) SpO2:  [89 %-98 %] 90 % (09/21 0800) FiO2 (%):  [50 %-70 %] 50 % (09/21 0749) Weight:  [74.1 kg-75.6 kg] 74.1 kg (09/21 0448)  Weight change: 0 kg Filed Weights   10/18/20 0500 10/19/20 0240 10/19/20 0448  Weight: 75.6 kg 75.6 kg 74.1 kg    Intake/Output: I/O last 3 completed shifts: In: 2918.9 [I.V.:736.6; NG/GT:1882.5; IV Piggyback:299.7] Out: 2400 [Urine:900; Other:1500]   Intake/Output this shift:  No intake/output data recorded.  General:intubated, sedated Heart:RRR Lungs:intuabted, bl chest expansion, no crackles Abdomen:soft, Non-tender, non-distended Extremities: Bilateral ankle edema present+ Neuro: sedated   Basic Metabolic Panel: Recent Labs  Lab 10/14/20 2322 10/15/20 0253 10/16/20 0421 10/16/20 1015 10/16/20 1723 10/17/20 0423  10/17/20 1650 10/18/20 0650 10/19/20 0508  NA 124* 126* 130*  --   --  131*  --  135 138  K 5.5* 5.1 4.2  --   --  3.9  --  4.3 3.8  CL 90* 90* 93*  --   --  95*  --  97* 98  CO2 18* 23 20*  --   --  16*  --  19* 24  GLUCOSE 265* 172* 124*  --   --  188*  --  274* 238*  BUN 102* 101* 104*  --   --  122*  --  145* 99*  CREATININE 5.32* 5.56* 5.57*  --   --  5.63*  --  6.12* 4.30*  CALCIUM 7.8* 8.0* 9.0  --   --  8.6*  --  8.6* 8.9  MG 2.4  --   --  2.1 2.2 2.5* 2.5*  --   --   PHOS  --   --   --  10.8* 10.5* 10.9* 10.4*  --  6.7*     Liver Function Tests: Recent Labs  Lab 10/13/20 0814 10/14/20 2322 10/15/20 0253 10/19/20 0508  AST  --  133* 183*  --   ALT  --  101* 121*  --   ALKPHOS  --  83 97  --   BILITOT  --  0.7 1.2  --   PROT  --  6.3* 6.5  --   ALBUMIN 2.8* 3.2* 3.3* 2.1*    No results for input(s): LIPASE, AMYLASE in the last 168 hours.  No results for input(s): AMMONIA in the last 168 hours.  CBC: Recent Labs  Lab 10/16/20 0421 10/17/20 0423 10/18/20 0305 10/18/20 0650 10/19/20 0508  WBC 16.3* 16.4* 19.7* 18.1* 20.8*  HGB 7.2* 7.8* 7.4* 7.2* 7.7*  HCT 22.4* 23.4* 23.1* 22.3* 23.8*  MCV 88.2 86.3 88.2 88.5 88.8  PLT 208 232 266 264 301     Cardiac Enzymes: No results for input(s): CKTOTAL, CKMB, CKMBINDEX, TROPONINI in the last 168 hours.  BNP: Invalid input(s): POCBNP  CBG: Recent Labs  Lab 10/18/20 1515 10/18/20 1945 10/18/20 2351 10/19/20 0321 10/19/20 0728  GLUCAP 307* 192* 261* 236* 274*     Microbiology: Results for orders placed or performed during the hospital encounter of 10/21/2020  Resp Panel by RT-PCR (Flu A&B, Covid) Nasopharyngeal Swab     Status: None   Collection Time: 09/30/2020  9:07 PM   Specimen: Nasopharyngeal Swab; Nasopharyngeal(NP) swabs in vial transport medium  Result Value Ref Range Status   SARS Coronavirus 2 by RT PCR NEGATIVE NEGATIVE Final    Comment: (NOTE) SARS-CoV-2 target nucleic acids are NOT  DETECTED.  The SARS-CoV-2 RNA is generally detectable in upper respiratory specimens during the acute phase of infection. The lowest concentration of SARS-CoV-2 viral copies this assay can detect is 138 copies/mL. A negative result does not preclude SARS-Cov-2 infection and should not be used as the sole basis for treatment or other patient management decisions. A negative result may occur with  improper specimen collection/handling, submission of specimen other than nasopharyngeal swab, presence of viral mutation(s) within the areas targeted by this assay, and inadequate number of viral copies(<138 copies/mL). A negative result must be combined with clinical observations, patient history, and epidemiological information. The expected result is Negative.  Fact Sheet for Patients:  EntrepreneurPulse.com.au  Fact Sheet for Healthcare Providers:  IncredibleEmployment.be  This test is no t yet approved or cleared by the [place] FDA and  has been authorized for detection and/or diagnosis of SARS-CoV-2 by FDA under an Emergency Use Authorization (EUA). This EUA will remain  in effect (meaning this test can be used) for the duration of the COVID-19 declaration under Section 564(b)(1) of the Act, 21 U.S.C.section 360bbb-3(b)(1), unless the authorization is terminated  or revoked sooner.       Influenza A by PCR NEGATIVE NEGATIVE Final   Influenza B by PCR NEGATIVE NEGATIVE Final    Comment: (NOTE) The Xpert Xpress SARS-CoV-2/FLU/RSV plus assay is intended as an aid in the diagnosis of influenza from Nasopharyngeal swab specimens and should not be used as a sole basis for treatment. Nasal washings and aspirates are unacceptable for Xpert Xpress SARS-CoV-2/FLU/RSV testing.  Fact Sheet for Patients: EntrepreneurPulse.com.au  Fact Sheet for Healthcare Providers: IncredibleEmployment.be  This test is not yet  approved or cleared by the [place] FDA and has been authorized for detection and/or diagnosis of SARS-CoV-2 by FDA under an Emergency Use Authorization (EUA). This EUA will remain in effect (meaning this test can be used) for the duration of the COVID-19 declaration under Section 564(b)(1) of the Act, 21 U.S.C. section 360bbb-3(b)(1), unless the authorization is terminated or revoked.  Performed at [facility], [address] [place], [place] [address]   MRSA Next Gen by PCR, Nasal     Status: None   Collection Time: 10/12/20  5:00 PM   Specimen: Nasal Mucosa; Nasal Swab  Result Value Ref Range Status   MRSA by PCR Next Gen NOT DETECTED NOT DETECTED Final    Comment: (NOTE) The GeneXpert MRSA Assay (FDA approved for NASAL specimens only), is one component of  a comprehensive MRSA colonization surveillance program. It is not intended to diagnose MRSA infection nor to guide or monitor treatment for MRSA infections. Test performance is not FDA approved in patients less than 74 years old. Performed at [facility], [address], [place], [place] [address]   Urine Culture     Status: None   Collection Time: 10/15/20  4:51 AM   Specimen: Urine, Catheterized  Result Value Ref Range Status   Specimen Description   Final    URINE, CATHETERIZED Performed at [facility], [address], [place], [place] [address]    Special Requests   Final    NONE Performed at [facility], [address] [place], [place] [address]    Culture   Final    NO GROWTH Performed at [facility], [place] [address] [place], [place] [address]    Report Status 10/16/2020 FINAL  Final  Culture, blood (x 2)     Status: None (Preliminary result)   Collection Time: 10/15/20  5:02 AM   Specimen: BLOOD LEFT WRIST  Result Value Ref Range Status   Specimen Description   Final    BLOOD LEFT WRIST BOTTLES DRAWN AEROBIC AND ANAEROBIC   Special Requests Blood Culture adequate volume  Final   Culture   Final     NO GROWTH 3 DAYS Performed at [facility], [address], [place], [place] [address]    Report Status PENDING  Incomplete  Culture, blood (x 2)     Status: None (Preliminary result)   Collection Time: 10/15/20  5:13 AM   Specimen: BLOOD LEFT ARM  Result Value Ref Range Status   Specimen Description BLOOD LEFT ARM BOTTLES DRAWN AEROBIC AND ANAEROBIC  Final   Special Requests Blood Culture adequate volume  Final   Culture   Final    NO GROWTH 3 DAYS Performed at [facility], [address] [place], [place] [address]    Report Status PENDING  Incomplete  Culture, Respiratory w Gram Stain     Status: None   Collection Time: 10/16/20  8:52 AM   Specimen: Tracheal Aspirate; Respiratory  Result Value Ref Range Status   Specimen Description TRACHEAL ASPIRATE  Final   Special Requests NONE  Final   Gram Stain   Final    RARE WBC PRESENT, PREDOMINANTLY PMN RARE GRAM POSITIVE COCCI IN PAIRS    Culture   Final    RARE Normal respiratory flora-no Staph aureus or Pseudomonas seen Performed at [facility], [address], [place], [place] [address]    Report Status 10/18/2020 FINAL  Final    Coagulation Studies: No results for input(s): LABPROT, INR in the last 72 hours.   Urinalysis: No results for input(s): COLORURINE, LABSPEC, PHURINE, GLUCOSEU, HGBUR, BILIRUBINUR, KETONESUR, PROTEINUR, UROBILINOGEN, NITRITE, LEUKOCYTESUR in the last 72 hours.  Invalid input(s): APPERANCEUR     Imaging: DG Chest Port 1 View  Result Date: 10/18/2020 CLINICAL DATA:  Central line placement.  Uterine cancer. EXAM: PORTABLE CHEST 1 VIEW COMPARISON:  Earlier today at 4:35 a.m. FINDINGS: 2:01 p.m. Endotracheal tube tip is difficult to visualized but well above the carina. Left internal jugular line tip at mid SVC. Nasogastric tube is looped in the stomach with tip directed cephalad. Mild cardiomegaly. Atherosclerosis in the transverse aorta. Small left pleural effusion with question of  loculation laterally, similar. No pneumothorax. Right greater than left interstitial and airspace disease is similar to earlier today. IMPRESSION: Left internal jugular line tip at  mid SVC, without pneumothorax. Small left pleural effusion with suggestion of mild loculation, similar. Interstitial and airspace disease is not significantly changed. Pulmonary edema and/or infection. Aortic Atherosclerosis (ICD10-I70.0). Electronically Signed   By: Abigail Miyamoto M.D.   On: 10/18/2020 14:39   DG CHEST PORT 1 VIEW  Result Date: 10/18/2020 CLINICAL DATA:  Hypoxia. EXAM: PORTABLE CHEST 1 VIEW COMPARISON:  10/15/2020 FINDINGS: The ET tube tip terminates above the carina. NG tube is again noted with tip terminating below the GE junction. Stable cardiomediastinal contours. Interval increase in volume of left pleural effusion. Increased diffuse interstitial and airspace densities throughout both lungs. Visualized osseous structures are unremarkable. IMPRESSION: 1. Increase in volume of left pleural effusion. 2. Increased diffuse interstitial and airspace densities throughout both lungs. Electronically Signed   By: Kerby Moors M.D.   On: 10/18/2020 06:07     Medications:    sodium chloride 5 mL/hr at 10/19/20 0700   sodium chloride     sodium chloride     ampicillin-sulbactam (UNASYN) IV Stopped (10/18/20 2220)   dexmedetomidine (PRECEDEX) IV infusion 1.2 mcg/kg/hr (10/19/20 0700)   feeding supplement (VITAL 1.5 CAL) 1,000 mL (10/19/20 0000)    calcitRIOL  0.25 mcg Per NG tube Daily   chlorhexidine gluconate (MEDLINE KIT)  15 mL Mouth Rinse BID   Chlorhexidine Gluconate Cloth  6 each Topical Daily   diltiazem  60 mg Per Tube Q8H   docusate  100 mg Per Tube BID   ezetimibe  10 mg Per Tube Daily   febuxostat  40 mg Per NG tube Daily   feeding supplement (PROSource TF)  45 mL Per Tube BID   gabapentin  200 mg Per Tube Q12H   hydrALAZINE  50 mg Per Tube Q8H   influenza vaccine adjuvanted  0.5 mL  Intramuscular Tomorrow-1000   insulin aspart  0-15 Units Subcutaneous Q4H   insulin aspart  6 Units Subcutaneous Q4H   insulin glargine-yfgn  20 Units Subcutaneous QHS   mouth rinse  15 mL Mouth Rinse 10 times per day   metoprolol tartrate  75 mg Per NG tube BID   pantoprazole sodium  40 mg Per Tube Daily   polyethylene glycol  17 g Per Tube Daily   pravastatin  20 mg Per NG tube QAC breakfast   prednisoLONE acetate  1 drop Left Eye BID   sodium bicarbonate  650 mg Per NG tube TID   sodium chloride flush  10-40 mL Intracatheter Q12H   sodium chloride, sodium chloride, albuterol, alteplase, fentaNYL (SUBLIMAZE) injection, heparin, hydrALAZINE, HYDROcodone-acetaminophen, lidocaine (PF), lidocaine-prilocaine, ondansetron (ZOFRAN) IV, pentafluoroprop-tetrafluoroeth, sodium chloride flush  Assessment/ Plan:  1.Acute kidney injury on CKD stage IV: Patient has longstanding CKD with baseline creatinine level seems to be around 2.6-2.9 however recently the GFR declined and creatinine level around 3.6 as outpatient.  Reportedly the biopsy result came back hypertensive and diabetic changes with severe interstitial fibrosis and tubular atrophy with mostly chronic changes.  -Discussed with critical care medicine.  Will initiate hemodialysis 10/18/2020 patient is clearly volume overloaded with significant azotemia.  Would be able to stop diuretics once initiated on dialysis.  Patient underwent successful dialysis 10/18/2020.  Her next dialysis will be 10/19/2020 and 10/20/2020.     2 Post biopsy left kidney hematoma: 8 x 4 x 12 cm subcapsular hematoma along the left kidney leading to mass-effect on the renal parenchyma.  IR was consulted, no plan for embolization.  Continue to monitor CBC and imaging studies. -Transfuse as necessary.  3 Cardiac arrest 9/16-asystole -transferred from [facility] post arrest -brief course of CPR, 1 round of epi     4 Hyponatremia, resolved at this point  6 Metabolic acidosis:  Initiating hemodialysis should be able to stop oral sodium bicarbonate   7 Hypertension, now hypotensive: pressor support per primary service   8 Anemia of CKD and blood loss postbiopsy: Monitor CBC and transfuse as needed.   LOS: [place] '@TODAY' '@8' :31 AM

## 2020-10-19 NOTE — Progress Notes (Signed)
NAME:  Krista Strickland, MRN:  [id number], DOB:  21-Jan-1951, LOS: 7 ADMISSION DATE:  10/19/2020, CONSULTATION DATE: 10/15/2020 REFERRING MD: Dr. Roger Shelter, CHIEF COMPLAINT: Cardiac arrest  History of Present Illness:  Krista Strickland is a 70 y.o. female with a past medical history significant for CKD stage V, hypertension, type 2 diabetes, GERD, hyperlipidemia, and hypertrophic cardiomyopathy who presented to the emergency department) with left lower quadrant abdominal pain that began day of admission.  Of note patient had renal biopsy 10/07/2020.  On ED arrival patient was seen hypertensive with all other vital signs within normal limits.  Due to abdominal pain complaints with recent liver biopsy CT abdomen pelvis was obtained and revealed 8 x 4 x 12 cm left present subscapular hematoma for which intervention radiology was consulted and recommended monitoring but if symptoms worsen and arteriogram could be considered.  Late evening of 9/16 patient suffered cardiac arrest.  After returning from the restroom patient had episode dyspnea followed by bradycardia and later asystole.  She received 1 mg of epi with ACLS/CPR with ROSC achieved and was seen with spontaneous respirations but significantly diminished mentation resulting in decision to endotracheally intubate.  Given sudden onset of dyspnea post ambulation with resultant bradycardia concern for PE as cause of cardiac arrest was very high given renal function decision was made to forego CTA and began therapeutic heparin.  Heparin drip was later discontinued due to downtrending of hemoglobin.  PCCM was consulted for further management and decision was made to transfer patient to [facility] for high-level care.  Significant Hospital Events: Including procedures, antibiotic start and stop dates in addition to other pertinent events   9/13 admitted to [facility] with abdominal pain found to have a subcapsular left renal hematoma post biopsy 9/16 cardiac  arrest likely due to aspiration 9/18 passed SBT but copious secretion 9/20 non-tunneled dialysis catheter placed and HD initiated  Interim History / Subjective:  Underwent HD overnight and no acute events overnight. Did not tolerate SBT this morning, as patient became tachypneic with increased work of breathing.  Objective   Blood pressure (!) 170/61, pulse 90, temperature 99 F (37.2 C), temperature source Axillary, resp. rate (!) 29, height 5\' 4"  (1.626 m), weight 74.1 kg, SpO2 90 %.    Vent Mode: PRVC FiO2 (%):  [50 %-70 %] 50 % Set Rate:  [18 bmp] 18 bmp Vt Set:  [440 mL] 440 mL PEEP:  [5 cmH20] 5 cmH20 Plateau Pressure:  [14 cmH20-20 cmH20] 14 cmH20   Intake/Output Summary (Last 24 hours) at 10/19/2020 0820 Last data filed at 10/19/2020 0700 Gross per 24 hour  Intake 2349.52 ml  Output 2150 ml  Net 199.52 ml    Filed Weights   10/18/20 0500 10/19/20 0240 10/19/20 0448  Weight: 75.6 kg 75.6 kg 74.1 kg    Examination: General: Acute on chronically ill appearing elderly female, lying in the bed, orally intubated  HEENT: Atraumatic, normocephalic, ETT, MM pink/moist Neuro: Opens eyes with vocal stimuli, moving all 4 extremities on command, moving lower extremities spontaneously CV: regular rate and rhythm, no murmur, rubs, or gallops,  PULM: Diffuse rhonchi, no wheezes appreciated GI: soft, non-distended, normal, active bowel sounds in all 4 quadrants Extremities: warm/dry, no pitting edema  Skin: no rashes or lesions  Resolved Hospital Problem list     Assessment & Plan:  S/p PEA cardiac arrest due to hypoxia  Critically ill due to acute hypoxic respiratory failure Primary driver of hypoxia is volume overload d/t  renal dysfunction. Diuresed 9/21 with 1.5 Liters fluid removed. Per nephrology, will get dialysis again today. Failure of SBT today further supports continued need for dialysis. -Continue HD 9/21 and 9/22 -Nephrology following -Continue chest physio and  albuterol nebs  AKI CKD stage V now CKD stage Vd High anion gap metabolic acidosis due to kidney disease Underwent HD overnight and will need today and tomorrow to remove more fluid.   -Continue HD 9/21 and 9/22 -Nephrology following -Discontinued sodium bicarbonate  Type 2 diabetes Given BG remains elevated with additional tube feed coverage, will increase basal dose of Semglee -Increase Semglee to 30 Units -Novolog SSI  -Novolog 6 Units every 6 hours for tube feed coverage  Septic shock due to aspiration pneumonia - now resolved -Continue Unasyn through 9/22  Perinephric hematoma post renal biopsy -Hemoglobin is stable at 7.4  Chronic hyponatremia Sodium has normalized, likely due to sodium load from Unasyn  HOCM Demand cardiac ischemia Hypertension Hyperlipidemia Anemia of CKD coupled with acute blood loss anemia Hypoalbuminemia with mild protein calorie malnutrition  Best Practice    Diet/type: tubefeeds DVT prophylaxis: SCD and subq heparin 5000 units q8hours GI prophylaxis: PPI Lines: Central line Foley:  Yes, and it is still needed Code Status:  full code Last date of multidisciplinary goals of care discussion: pending  Labs   CBC: Recent Labs  Lab 10/16/20 0421 10/17/20 0423 10/18/20 0305 10/18/20 0650 10/19/20 0508  WBC 16.3* 16.4* 19.7* 18.1* 20.8*  HGB 7.2* 7.8* 7.4* 7.2* 7.7*  HCT 22.4* 23.4* 23.1* 22.3* 23.8*  MCV 88.2 86.3 88.2 88.5 88.8  PLT 208 232 266 264 301     Basic Metabolic Panel: Recent Labs  Lab 10/14/20 2322 10/15/20 0253 10/16/20 0421 10/16/20 1015 10/16/20 1723 10/17/20 0423 10/17/20 1650 10/18/20 0650 10/19/20 0508  NA 124* 126* 130*  --   --  131*  --  135 138  K 5.5* 5.1 4.2  --   --  3.9  --  4.3 3.8  CL 90* 90* 93*  --   --  95*  --  97* 98  CO2 18* 23 20*  --   --  16*  --  19* 24  GLUCOSE 265* 172* 124*  --   --  188*  --  274* 238*  BUN 102* 101* 104*  --   --  122*  --  145* 99*  CREATININE 5.32* 5.56*  5.57*  --   --  5.63*  --  6.12* 4.30*  CALCIUM 7.8* 8.0* 9.0  --   --  8.6*  --  8.6* 8.9  MG 2.4  --   --  2.1 2.2 2.5* 2.5*  --   --   PHOS  --   --   --  10.8* 10.5* 10.9* 10.4*  --  6.7*    GFR: Estimated Creatinine Clearance: 12 mL/min (A) (by C-G formula based on SCr of 4.3 mg/dL (H)). Recent Labs  Lab 10/13/20 0814 10/14/20 0505 10/14/20 2322 10/14/20 2323 10/15/20 0253 10/16/20 0421 10/17/20 0423 10/18/20 0305 10/18/20 0650 10/19/20 0508  PROCALCITON 0.65  --   --   --   --   --   --   --   --   --   WBC 17.6*   < >  --    < > 21.9*   < > 16.4* 19.7* 18.1* 20.8*  LATICACIDVEN 1.0  --  6.1*  --  1.9  --   --   --   --   --    < > =  values in this interval not displayed.     Liver Function Tests: Recent Labs  Lab 10/13/20 0814 10/14/20 2322 10/15/20 0253 10/19/20 0508  AST  --  133* 183*  --   ALT  --  101* 121*  --   ALKPHOS  --  83 97  --   BILITOT  --  0.7 1.2  --   PROT  --  6.3* 6.5  --   ALBUMIN 2.8* 3.2* 3.3* 2.1*    No results for input(s): LIPASE, AMYLASE in the last 168 hours.  No results for input(s): AMMONIA in the last 168 hours.  ABG    Component Value Date/Time   PHART 7.238 (L) 10/15/2020 0038   PCO2ART 42.4 10/15/2020 0038   PO2ART 167 (H) 10/15/2020 0038   HCO3 17.2 (L) 10/15/2020 0038   TCO2 36 03/04/2012 1749   ACIDBASEDEF 8.6 (H) 10/15/2020 0038   O2SAT 98.4 10/15/2020 0038      Coagulation Profile: Recent Labs  Lab 10/15/20 0253  INR 1.2     Cardiac Enzymes: No results for input(s): CKTOTAL, CKMB, CKMBINDEX, TROPONINI in the last 168 hours.  HbA1C: Hemoglobin A1C  Date/Time Value Ref Range Status  09/29/2020 02:49 PM 7.2 (A) 4.0 - 5.6 % Final  06/29/2020 10:23 AM 8.9 (A) 4.0 - 5.6 % Final   Hgb A1c MFr Bld  Date/Time Value Ref Range Status  09/22/2020 01:13 PM 7.6 (H) 4.8 - 5.6 % Final    Comment:    (NOTE) Pre diabetes:          5.7%-6.4%  Diabetes:              >6.4%  Glycemic control for    <7.0% adults with diabetes   03/17/2020 04:24 PM 7.1 (H) 4.8 - 5.6 % Final    Comment:             Prediabetes: 5.7 - 6.4          Diabetes: >6.4          Glycemic control for adults with diabetes: <7.0     CBG: Recent Labs  Lab 10/18/20 1515 10/18/20 1945 10/18/20 2351 10/19/20 0321 10/19/20 [place], MS4

## 2020-10-20 LAB — RENAL FUNCTION PANEL
Albumin: 2 g/dL — ABNORMAL LOW (ref 3.5–5.0)
Anion gap: 13 (ref 5–15)
BUN: 79 mg/dL — ABNORMAL HIGH (ref 8–23)
CO2: 26 mmol/L (ref 22–32)
Calcium: 8.9 mg/dL (ref 8.9–10.3)
Chloride: 99 mmol/L (ref 98–111)
Creatinine, Ser: 3.7 mg/dL — ABNORMAL HIGH (ref 0.44–1.00)
GFR, Estimated: 13 mL/min — ABNORMAL LOW (ref >60)
Glucose, Bld: 302 mg/dL — ABNORMAL HIGH (ref 70–99)
Phosphorus: 6.3 mg/dL — ABNORMAL HIGH (ref 2.5–4.6)
Potassium: 3.8 mmol/L (ref 3.5–5.1)
Sodium: 138 mmol/L (ref 135–145)

## 2020-10-20 LAB — CBC
HCT: 23 % — ABNORMAL LOW (ref 36.0–46.0)
Hemoglobin: 7.3 g/dL — ABNORMAL LOW (ref 12.0–15.0)
MCH: 29 pg (ref 26.0–34.0)
MCHC: 31.7 g/dL (ref 30.0–36.0)
MCV: 91.3 fL (ref 80.0–100.0)
Platelets: 304 10*3/uL (ref 150–400)
RBC: 2.52 MIL/uL — ABNORMAL LOW (ref 3.87–5.11)
RDW: 14.9 % (ref 11.5–15.5)
WBC: 21.4 10*3/uL — ABNORMAL HIGH (ref 4.0–10.5)
nRBC: 2.3 % — ABNORMAL HIGH (ref 0.0–0.2)

## 2020-10-20 LAB — GLUCOSE, CAPILLARY
Glucose-Capillary: 160 mg/dL — ABNORMAL HIGH (ref 70–99)
Glucose-Capillary: 222 mg/dL — ABNORMAL HIGH (ref 70–99)
Glucose-Capillary: 247 mg/dL — ABNORMAL HIGH (ref 70–99)
Glucose-Capillary: 269 mg/dL — ABNORMAL HIGH (ref 70–99)
Glucose-Capillary: 281 mg/dL — ABNORMAL HIGH (ref 70–99)
Glucose-Capillary: 288 mg/dL — ABNORMAL HIGH (ref 70–99)

## 2020-10-20 LAB — MAGNESIUM: Magnesium: 2.3 mg/dL (ref 1.7–2.4)

## 2020-10-20 LAB — CULTURE, BLOOD (ROUTINE X 2)
Culture: NO GROWTH
Culture: NO GROWTH
Special Requests: ADEQUATE
Special Requests: ADEQUATE

## 2020-10-20 LAB — HEPATITIS B SURFACE ANTIBODY, QUANTITATIVE: Hep B S AB Quant (Post): 31 m[IU]/mL (ref >9.9)

## 2020-10-20 MED ORDER — RENA-VITE PO TABS
1.0000 | ORAL_TABLET | Freq: Every day | ORAL | Status: DC
  Administered 2020-10-20 – 2020-10-24 (×5): 1
  Filled 2020-10-20 (×5): qty 1

## 2020-10-20 MED ORDER — INSULIN ASPART 100 UNIT/ML IJ SOLN
10.0000 [IU] | INTRAMUSCULAR | Status: DC
  Administered 2020-10-20 – 2020-10-28 (×47): 10 [IU] via SUBCUTANEOUS

## 2020-10-20 MED ORDER — ALBUTEROL SULFATE (2.5 MG/3ML) 0.083% IN NEBU
2.5000 mg | INHALATION_SOLUTION | Freq: Two times a day (BID) | RESPIRATORY_TRACT | Status: DC
  Administered 2020-10-20 – 2020-10-28 (×16): 2.5 mg via RESPIRATORY_TRACT
  Filled 2020-10-20 (×16): qty 3

## 2020-10-20 MED ORDER — PROSOURCE TF PO LIQD
45.0000 mL | Freq: Three times a day (TID) | ORAL | Status: DC
Start: 2020-10-20 — End: 2020-10-25
  Administered 2020-10-20 – 2020-10-25 (×15): 45 mL
  Filled 2020-10-20 (×15): qty 45

## 2020-10-20 NOTE — Progress Notes (Signed)
[facility] ROUNDING NOTE   Subjective:   Interval History: 70 year old lady with stage IV chronic kidney disease diabetes mellitus type 2 hyperlipidemia hypertrophic cardiomyopathy presents to the emergency department left lower quadrant pain status postbiopsy 10/07/2020.  Was found to have 8 x 4 x 12 cm subcapsular hematoma.  Late evening 10/15/2018.  With cardiac arrest received 1 mg of epi with ACLS/CPR with ROSC achieved.  Concerns for pulmonary embolus.  Heparin initiated.  Transferred from [facility] to [facility].    Urine output 700 cc 10/18/2020 underwent dialysis 10/19/2020 with 1.5 L, 10/20/2021 with 2.5 L removed .  Next dialysis will be 10/21/2020  Chest x-ray left pleural effusion with increased interstitial and airspace densities   Blood pressure 135/58 pulse 72 temperature 98.4 O2 sats 97% FiO2 40%  Sodium 138 potassium 3.8 chloride 99 CO2 26 BUN 79 creatinine 3.7 glucose 302 calcium 8.9 phosphorus 6.3 albumin 2 hemoglobin 7.3    Objective:  Vital signs in last 24 hours:  Temp:  [97.2 F (36.2 C)-100.6 F (38.1 C)] 98.4 F (36.9 C) (09/22 0747) Pulse Rate:  [67-176] 67 (09/22 0800) Resp:  [18-34] 19 (09/22 0800) BP: (77-189)/(56-102) 128/56 (09/22 0800) SpO2:  [89 %-100 %] 98 % (09/22 0800) FiO2 (%):  [40 %] 40 % (09/22 0800) Weight:  [81.6 kg-83.8 kg] 81.6 kg (09/22 0144)  Weight change: 8.2 kg Filed Weights   10/19/20 0448 10/19/20 2235 10/20/20 0144  Weight: 74.1 kg 83.8 kg 81.6 kg    Intake/Output: I/O last 3 completed shifts: In: 2809.3 [I.V.:709.4; NG/GT:1800; IV Piggyback:299.9] Out: 4406 [Urine:620; Other:3786]   Intake/Output this shift:  Total I/O In: 100.1 [I.V.:20.1; NG/GT:80] Out: -   General:intubated, sedated Heart:RRR Lungs:intuabted, bl chest expansion, no crackles Abdomen:soft, Non-tender, non-distended Extremities: Bilateral ankle edema present+ Neuro: sedated   Basic Metabolic Panel: Recent Labs  Lab  10/14/20 2322 10/16/20 0421 10/16/20 1015 10/16/20 1723 10/17/20 0423 10/17/20 1650 10/18/20 0650 10/19/20 0508 10/20/20 0526  NA  --  130*  --   --  131*  --  135 138 138  K  --  4.2  --   --  3.9  --  4.3 3.8 3.8  CL  --  93*  --   --  95*  --  97* 98 99  CO2  --  20*  --   --  16*  --  19* 24 26  GLUCOSE  --  124*  --   --  188*  --  274* 238* 302*  BUN  --  104*  --   --  122*  --  145* 99* 79*  CREATININE  --  5.57*  --   --  5.63*  --  6.12* 4.30* 3.70*  CALCIUM  --  9.0  --   --  8.6*  --  8.6* 8.9 8.9  MG  --   --  2.1 2.2 2.5* 2.5*  --   --  2.3  PHOS   < >  --  10.8* 10.5* 10.9* 10.4*  --  6.7* 6.3*   < > = values in this interval not displayed.     Liver Function Tests: Recent Labs  Lab 10/14/20 2322 10/15/20 0253 10/19/20 0508 10/20/20 0526  AST 133* 183*  --   --   ALT 101* 121*  --   --   ALKPHOS 83 97  --   --   BILITOT 0.7 1.2  --   --   PROT  6.3* 6.5  --   --   ALBUMIN 3.2* 3.3* 2.1* 2.0*    No results for input(s): LIPASE, AMYLASE in the last 168 hours.  No results for input(s): AMMONIA in the last 168 hours.  CBC: Recent Labs  Lab 10/17/20 0423 10/18/20 0305 10/18/20 0650 10/19/20 0508 10/20/20 0526  WBC 16.4* 19.7* 18.1* 20.8* 21.4*  HGB 7.8* 7.4* 7.2* 7.7* 7.3*  HCT 23.4* 23.1* 22.3* 23.8* 23.0*  MCV 86.3 88.2 88.5 88.8 91.3  PLT 232 266 264 301 304     Cardiac Enzymes: No results for input(s): CKTOTAL, CKMB, CKMBINDEX, TROPONINI in the last 168 hours.  BNP: Invalid input(s): POCBNP  CBG: Recent Labs  Lab 10/19/20 1525 10/19/20 1919 10/19/20 2321 10/20/20 0348 10/20/20 0745  GLUCAP 273* 294* 264* 269* 281*     Microbiology: Results for orders placed or performed during the hospital encounter of 10/07/2020  Resp Panel by RT-PCR (Flu A&B, Covid) Nasopharyngeal Swab     Status: None   Collection Time: 10/14/2020  9:07 PM   Specimen: Nasopharyngeal Swab; Nasopharyngeal(NP) swabs in vial transport medium  Result Value Ref  Range Status   SARS Coronavirus 2 by RT PCR NEGATIVE NEGATIVE Final    Comment: (NOTE) SARS-CoV-2 target nucleic acids are NOT DETECTED.  The SARS-CoV-2 RNA is generally detectable in upper respiratory specimens during the acute phase of infection. The lowest concentration of SARS-CoV-2 viral copies this assay can detect is 138 copies/mL. A negative result does not preclude SARS-Cov-2 infection and should not be used as the sole basis for treatment or other patient management decisions. A negative result may occur with  improper specimen collection/handling, submission of specimen other than nasopharyngeal swab, presence of viral mutation(s) within the areas targeted by this assay, and inadequate number of viral copies(<138 copies/mL). A negative result must be combined with clinical observations, patient history, and epidemiological information. The expected result is Negative.  Fact Sheet for Patients:  EntrepreneurPulse.com.au  Fact Sheet for Healthcare Providers:  IncredibleEmployment.be  This test is no t yet approved or cleared by the [place] FDA and  has been authorized for detection and/or diagnosis of SARS-CoV-2 by FDA under an Emergency Use Authorization (EUA). This EUA will remain  in effect (meaning this test can be used) for the duration of the COVID-19 declaration under Section 564(b)(1) of the Act, 21 U.S.C.section 360bbb-3(b)(1), unless the authorization is terminated  or revoked sooner.       Influenza A by PCR NEGATIVE NEGATIVE Final   Influenza B by PCR NEGATIVE NEGATIVE Final    Comment: (NOTE) The Xpert Xpress SARS-CoV-2/FLU/RSV plus assay is intended as an aid in the diagnosis of influenza from Nasopharyngeal swab specimens and should not be used as a sole basis for treatment. Nasal washings and aspirates are unacceptable for Xpert Xpress SARS-CoV-2/FLU/RSV testing.  Fact Sheet for  Patients: EntrepreneurPulse.com.au  Fact Sheet for Healthcare Providers: IncredibleEmployment.be  This test is not yet approved or cleared by the [place] FDA and has been authorized for detection and/or diagnosis of SARS-CoV-2 by FDA under an Emergency Use Authorization (EUA). This EUA will remain in effect (meaning this test can be used) for the duration of the COVID-19 declaration under Section 564(b)(1) of the Act, 21 U.S.C. section 360bbb-3(b)(1), unless the authorization is terminated or revoked.  Performed at [facility], [address] [place], [place] [address]   MRSA Next Gen by PCR, Nasal     Status: None   Collection Time: 10/12/20  5:00 PM  Specimen: Nasal Mucosa; Nasal Swab  Result Value Ref Range Status   MRSA by PCR Next Gen NOT DETECTED NOT DETECTED Final    Comment: (NOTE) The GeneXpert MRSA Assay (FDA approved for NASAL specimens only), is one component of a comprehensive MRSA colonization surveillance program. It is not intended to diagnose MRSA infection nor to guide or monitor treatment for MRSA infections. Test performance is not FDA approved in patients less than 60 years old. Performed at [facility], [address], [place], [place] [address]   Urine Culture     Status: None   Collection Time: 10/15/20  4:51 AM   Specimen: Urine, Catheterized  Result Value Ref Range Status   Specimen Description   Final    URINE, CATHETERIZED Performed at [facility], [address], [place], [place] [address]    Special Requests   Final    NONE Performed at [facility], [address], [place], [place] [address]    Culture   Final    NO GROWTH Performed at [facility], [place] [address] [place], [place] [address]    Report Status 10/16/2020 FINAL  Final  Culture, blood (x 2)     Status: None   Collection Time: 10/15/20  5:02 AM   Specimen: BLOOD LEFT WRIST  Result Value Ref Range Status   Specimen Description    Final    BLOOD LEFT WRIST BOTTLES DRAWN AEROBIC AND ANAEROBIC   Special Requests Blood Culture adequate volume  Final   Culture   Final    NO GROWTH 5 DAYS Performed at [facility], [address] [place], [place] [address]    Report Status 10/20/2020 FINAL  Final  Culture, blood (x 2)     Status: None   Collection Time: 10/15/20  5:13 AM   Specimen: BLOOD LEFT ARM  Result Value Ref Range Status   Specimen Description BLOOD LEFT ARM BOTTLES DRAWN AEROBIC AND ANAEROBIC  Final   Special Requests Blood Culture adequate volume  Final   Culture   Final    NO GROWTH 5 DAYS Performed at [facility], [address] [place], [place] [address]    Report Status 10/20/2020 FINAL  Final  Culture, Respiratory w Gram Stain     Status: None   Collection Time: 10/16/20  8:52 AM   Specimen: Tracheal Aspirate; Respiratory  Result Value Ref Range Status   Specimen Description TRACHEAL ASPIRATE  Final   Special Requests NONE  Final   Gram Stain   Final    RARE WBC PRESENT, PREDOMINANTLY PMN RARE GRAM POSITIVE COCCI IN PAIRS    Culture   Final    RARE Normal respiratory flora-no Staph aureus or Pseudomonas seen Performed at [facility], [address], [place], [place] [address]    Report Status 10/18/2020 FINAL  Final    Coagulation Studies: No results for input(s): LABPROT, INR in the last 72 hours.   Urinalysis: No results for input(s): COLORURINE, LABSPEC, PHURINE, GLUCOSEU, HGBUR, BILIRUBINUR, KETONESUR, PROTEINUR, UROBILINOGEN, NITRITE, LEUKOCYTESUR in the last 72 hours.  Invalid input(s): APPERANCEUR     Imaging: DG Chest Port 1 View  Result Date: 10/18/2020 CLINICAL DATA:  Central line placement.  Uterine cancer. EXAM: PORTABLE CHEST 1 VIEW COMPARISON:  Earlier today at 4:35 a.m. FINDINGS: 2:01 p.m. Endotracheal tube tip is difficult to visualized but well above the carina. Left internal jugular line tip at mid SVC. Nasogastric tube is looped in the stomach with tip  directed  cephalad. Mild cardiomegaly. Atherosclerosis in the transverse aorta. Small left pleural effusion with question of loculation laterally, similar. No pneumothorax. Right greater than left interstitial and airspace disease is similar to earlier today. IMPRESSION: Left internal jugular line tip at mid SVC, without pneumothorax. Small left pleural effusion with suggestion of mild loculation, similar. Interstitial and airspace disease is not significantly changed. Pulmonary edema and/or infection. Aortic Atherosclerosis (ICD10-I70.0). Electronically Signed   By: Abigail Miyamoto M.D.   On: 10/18/2020 14:39     Medications:    sodium chloride 5 mL/hr at 10/20/20 0700   sodium chloride     sodium chloride     ampicillin-sulbactam (UNASYN) IV Stopped (10/19/20 2145)   dexmedetomidine (PRECEDEX) IV infusion 0.6 mcg/kg/hr (10/20/20 0824)   feeding supplement (VITAL 1.5 CAL) 1,000 mL (10/19/20 2221)    albuterol  2.5 mg Nebulization BID   calcitRIOL  0.25 mcg Per Tube Daily   chlorhexidine gluconate (MEDLINE KIT)  15 mL Mouth Rinse BID   Chlorhexidine Gluconate Cloth  6 each Topical Daily   diltiazem  60 mg Per Tube Q8H   docusate  100 mg Per Tube BID   ezetimibe  10 mg Per Tube Daily   febuxostat  40 mg Per NG tube Daily   feeding supplement (PROSource TF)  45 mL Per Tube BID   gabapentin  200 mg Per Tube Q12H   heparin injection (subcutaneous)  5,000 Units Subcutaneous Q8H   hydrALAZINE  50 mg Per Tube Q8H   influenza vaccine adjuvanted  0.5 mL Intramuscular Tomorrow-1000   insulin aspart  0-15 Units Subcutaneous Q4H   insulin aspart  6 Units Subcutaneous Q4H   insulin glargine-yfgn  30 Units Subcutaneous QHS   mouth rinse  15 mL Mouth Rinse 10 times per day   metoprolol tartrate  75 mg Per NG tube BID   pantoprazole sodium  40 mg Per Tube Daily   polyethylene glycol  17 g Per Tube Daily   pravastatin  20 mg Per NG tube QAC breakfast   prednisoLONE acetate  1 drop Left Eye BID   sodium  chloride flush  10-40 mL Intracatheter Q12H   sodium chloride, sodium chloride, albuterol, alteplase, fentaNYL (SUBLIMAZE) injection, heparin, hydrALAZINE, HYDROcodone-acetaminophen, lidocaine (PF), lidocaine-prilocaine, ondansetron (ZOFRAN) IV, pentafluoroprop-tetrafluoroeth, sodium chloride flush  Assessment/ Plan:  1.Acute kidney injury on CKD stage IV: Patient has longstanding CKD with baseline creatinine level seems to be around 2.6-2.9 however recently the GFR declined and creatinine level around 3.6 as outpatient.  Reportedly the biopsy result came back hypertensive and diabetic changes with severe interstitial fibrosis and tubular atrophy with mostly chronic changes.  -Discussed with critical care medicine.  Will initiate hemodialysis 10/19/2020 patient is clearly volume overloaded with significant azotemia.  Would be able to stop diuretics once initiated on dialysis.  Patient underwent successful dialysis 10/19/2020 and 10/20/2021 Next dialysis treatment 10/21/2021     2 Post biopsy left kidney hematoma: 8 x 4 x 12 cm subcapsular hematoma along the left kidney leading to mass-effect on the renal parenchyma.  IR was consulted, no plan for embolization.  Continue to monitor CBC and imaging studies. -Transfuse as necessary.   3 Cardiac arrest 9/16-asystole -transferred from [facility] post arrest -brief course of CPR, 1 round of epi     4 Hyponatremia, resolved at this point  6 Metabolic acidosis: Initiating hemodialysis should be able to stop oral sodium bicarbonate   7 Hypertension, now hypotensive: pressor support per primary service   8 Anemia of  CKD and blood loss postbiopsy: Monitor CBC and transfuse as needed.   LOS: [place] _0 _1 :41 AM

## 2020-10-20 NOTE — Progress Notes (Signed)
Arrived to bedside for hemodialysis of Miss Krista Strickland.  Initially attempted 3L. 1st hour of treatment, pt tolerated goal. Then bp dropped noticeably, and after other factors considered, UF paused and goal adjusted. This was repeated until pressures stabilized and goal of 1450 set.  This was the final removal: 1.45 L  Pt vitals stable at end of treatment, bedside RN kept abreast of pt status  MD informed that goal could not be reached for pt

## 2020-10-20 NOTE — Progress Notes (Addendum)
NAME:  Krista Strickland, MRN:  [id number], DOB:  [birth year], LOS: 8 ADMISSION DATE:  10/05/2020, CONSULTATION DATE: 10/15/2020 REFERRING MD: Dr. Roger Shelter, CHIEF COMPLAINT: Cardiac arrest  History of Present Illness:  Krista Strickland is a 70 y.o. female with a past medical history significant for CKD stage V, hypertension, type 2 diabetes, GERD, hyperlipidemia, and hypertrophic cardiomyopathy who presented to the emergency department) with left lower quadrant abdominal pain that began day of admission.  Of note patient had renal biopsy 10/07/2020.  On ED arrival patient was seen hypertensive with all other vital signs within normal limits.  Due to abdominal pain complaints with recent liver biopsy CT abdomen pelvis was obtained and revealed 8 x 4 x 12 cm left present subscapular hematoma for which intervention radiology was consulted and recommended monitoring but if symptoms worsen and arteriogram could be considered.  Late evening of 9/16 patient suffered cardiac arrest.  After returning from the restroom patient had episode dyspnea followed by bradycardia and later asystole.  She received 1 mg of epi with ACLS/CPR with ROSC achieved and was seen with spontaneous respirations but significantly diminished mentation resulting in decision to endotracheally intubate.  Given sudden onset of dyspnea post ambulation with resultant bradycardia concern for PE as cause of cardiac arrest was very high given renal function decision was made to forego CTA and began therapeutic heparin.  Heparin drip was later discontinued due to downtrending of hemoglobin.  PCCM was consulted for further management and decision was made to transfer patient to [facility] for high-level care.  Significant Hospital Events: Including procedures, antibiotic start and stop dates in addition to other pertinent events   9/13 admitted to [facility] with abdominal pain found to have a subcapsular left renal hematoma post biopsy 9/16 cardiac  arrest likely due to aspiration 9/18 passed SBT but copious secretion 9/20 non-tunneled dialysis catheter placed and HD initiated  Interim History / Subjective:  Dialysis 9/21 and 9/22. No acute events overnight. Tolerating SBT while team in room.   Objective   Blood pressure 140/60, pulse 67, temperature 98.4 F (36.9 C), temperature source Axillary, resp. rate 20, height 5\' 4"  (1.626 m), weight 81.6 kg, SpO2 98 %.    Vent Mode: PRVC FiO2 (%):  [40 %] 40 % Set Rate:  [18 bmp] 18 bmp Vt Set:  [440 mL] 440 mL PEEP:  [5 cmH20] 5 cmH20 Plateau Pressure:  [14 cmH20-24 cmH20] 17 cmH20   Intake/Output Summary (Last 24 hours) at 10/20/2020 0814 Last data filed at 10/20/2020 0700 Gross per 24 hour  Intake 1884.76 ml  Output 2606 ml  Net -721.24 ml    Filed Weights   10/19/20 0448 10/19/20 2235 10/20/20 0144  Weight: 74.1 kg 83.8 kg 81.6 kg    Examination: General: Acute on chronically ill appearing elderly female, lying in the bed, orally intubated  HEENT: Atraumatic, normocephalic, ETT, MM pink/moist Neuro: Opens eyes with vocal stimuli, moving all 4 extremities on command, moving lower extremities spontaneously CV: regular rate and rhythm PULM: Faint diffuse rhonchi, no wheezes appreciated GI: soft, non-distended, normal, active bowel sounds in all 4 quadrants Extremities: warm/dry, no pitting edema  Skin: no rashes or lesions  Resolved Hospital Problem list     Assessment & Plan:  S/p PEA cardiac arrest due to hypoxia  Critically ill due to acute hypoxic respiratory failure Primary driver of hypoxia is volume overload d/t renal dysfunction. Diuresed 9/21 with 1.5 L and 9/22 with 2.3 L fluid removed. Per nephrology,  will need dialysis again 9/23.  Tolerating SBT this morning and could possibly extubate today.  -Next HD session 9/23 -Nephrology following -Evaluate after 2 hour SBT and plan for extubation  AKI CKD stage V now CKD stage Vd High anion gap metabolic  acidosis due to kidney disease Underwent HD on 9/21 and 9/22 (as stated above). Will need additionally session today.  -Continue HD to 9/23 -Nephrology following  Type 2 diabetes Given BG remains elevated with will increase tube feed coverage. If BG continues to be elevated after increase in tube feed coverage, increase Semglee to 40 Units. -Semglee 30 Units -Novolog SSI  -Increase Novolog to 10 Units every 4 hours for tube feed coverage  Septic shock due to aspiration pneumonia - now resolved -Last day of Unasyn (9/22)  Perinephric hematoma post renal biopsy -Hemoglobin is stable at 7.3  Chronic hyponatremia Sodium has normalized, likely due to sodium load from Unasyn  HOCM Demand cardiac ischemia Hypertension Hyperlipidemia Anemia of CKD coupled with acute blood loss anemia Hypoalbuminemia with mild protein calorie malnutrition  Best Practice    Diet/type: tubefeeds DVT prophylaxis: SCD and subq heparin 5000 units q8hours GI prophylaxis: PPI Lines: Central line Foley:  Yes, and it is still needed Code Status:  full code Last date of multidisciplinary goals of care discussion: pending  Labs   CBC: Recent Labs  Lab 10/17/20 0423 10/18/20 0305 10/18/20 0650 10/19/20 0508 10/20/20 0526  WBC 16.4* 19.7* 18.1* 20.8* 21.4*  HGB 7.8* 7.4* 7.2* 7.7* 7.3*  HCT 23.4* 23.1* 22.3* 23.8* 23.0*  MCV 86.3 88.2 88.5 88.8 91.3  PLT 232 266 264 301 304     Basic Metabolic Panel: Recent Labs  Lab 10/14/20 2322 10/16/20 0421 10/16/20 1015 10/16/20 1723 10/17/20 0423 10/17/20 1650 10/18/20 0650 10/19/20 0508 10/20/20 0526  NA  --  130*  --   --  131*  --  135 138 138  K  --  4.2  --   --  3.9  --  4.3 3.8 3.8  CL  --  93*  --   --  95*  --  97* 98 99  CO2  --  20*  --   --  16*  --  19* 24 26  GLUCOSE  --  124*  --   --  188*  --  274* 238* 302*  BUN  --  104*  --   --  122*  --  145* 99* 79*  CREATININE  --  5.57*  --   --  5.63*  --  6.12* 4.30* 3.70*  CALCIUM   --  9.0  --   --  8.6*  --  8.6* 8.9 8.9  MG  --   --  2.1 2.2 2.5* 2.5*  --   --  2.3  PHOS   < >  --  10.8* 10.5* 10.9* 10.4*  --  6.7* 6.3*   < > = values in this interval not displayed.    GFR: Estimated Creatinine Clearance: 14.6 mL/min (A) (by C-G formula based on SCr of 3.7 mg/dL (H)). Recent Labs  Lab 10/14/20 2322 10/14/20 2323 10/15/20 0253 10/16/20 0421 10/18/20 0305 10/18/20 0650 10/19/20 0508 10/20/20 0526  WBC  --    < > 21.9*   < > 19.7* 18.1* 20.8* 21.4*  LATICACIDVEN 6.1*  --  1.9  --   --   --   --   --    < > = values in this interval not  displayed.     Liver Function Tests: Recent Labs  Lab 10/14/20 2322 10/15/20 0253 10/19/20 0508 10/20/20 0526  AST 133* 183*  --   --   ALT 101* 121*  --   --   ALKPHOS 83 97  --   --   BILITOT 0.7 1.2  --   --   PROT 6.3* 6.5  --   --   ALBUMIN 3.2* 3.3* 2.1* 2.0*    No results for input(s): LIPASE, AMYLASE in the last 168 hours.  No results for input(s): AMMONIA in the last 168 hours.  ABG    Component Value Date/Time   PHART 7.238 (L) 10/15/2020 0038   PCO2ART 42.4 10/15/2020 0038   PO2ART 167 (H) 10/15/2020 0038   HCO3 17.2 (L) 10/15/2020 0038   TCO2 36 03/04/2012 1749   ACIDBASEDEF 8.6 (H) 10/15/2020 0038   O2SAT 98.4 10/15/2020 0038      Coagulation Profile: Recent Labs  Lab 10/15/20 0253  INR 1.2     Cardiac Enzymes: No results for input(s): CKTOTAL, CKMB, CKMBINDEX, TROPONINI in the last 168 hours.  HbA1C: Hemoglobin A1C  Date/Time Value Ref Range Status  09/29/2020 02:49 PM 7.2 (A) 4.0 - 5.6 % Final  06/29/2020 10:23 AM 8.9 (A) 4.0 - 5.6 % Final   Hgb A1c MFr Bld  Date/Time Value Ref Range Status  09/22/2020 01:13 PM 7.6 (H) 4.8 - 5.6 % Final    Comment:    (NOTE) Pre diabetes:          5.7%-6.4%  Diabetes:              >6.4%  Glycemic control for   <7.0% adults with diabetes   03/17/2020 04:24 PM 7.1 (H) 4.8 - 5.6 % Final    Comment:             Prediabetes: 5.7 -  6.4          Diabetes: >6.4          Glycemic control for adults with diabetes: <7.0     CBG: Recent Labs  Lab 10/19/20 1525 10/19/20 1919 10/19/20 2321 10/20/20 0348 10/20/20 [place]    Jacqlyn Larsen, MS4

## 2020-10-20 NOTE — Progress Notes (Signed)
Nutrition Follow-up  DOCUMENTATION CODES:   Not applicable  INTERVENTION:   Continue tube feeding via OG tube: - Vital 1.5 @ 50 ml/hr (1200 ml/day) - ProSource TF 45 ml TID  Tube feeding regimen provides 1920 kcal, 114 grams of protein, and 917 ml of H2O.   - Renal MVI daily per tube  NUTRITION DIAGNOSIS:   Inadequate oral intake related to inability to eat as evidenced by NPO status.  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  9/16 - cardiac arrest likely due to aspiration (pt with thick, copious amount of greenish respiratory secretions), intubated 9/18 - TF initiated 9/20 - non-tunneled HD cath placed, first iHD  Discussed pt with RN and during ICU rounds. Pt now requiring iHD and is receiving iHD again today. Next iHD planned for 9/23. Per CCM note, possible extubation tomorrow. RN reports pt tolerating current tube feeds without issue. RD to add renal MVI daily per tube.  Admit weight: 80.7 kg Current weight: 81.6 kg  Pt continues to have edema but improving now that pt has had iHD treatments and volume removed. Current weight close to admit weight.  Current TF: Vital 1.5 @ 50 ml/hr, ProSource TF 45 ml BID  Patient is currently intubated on ventilator support MV: 12.4 L/min Temp (24hrs), Avg:99.1 F (37.3 C), Min:97.2 F (36.2 C), Max:100.6 F (38.1 C)  Drips: Propofol  Medications reviewed and include: calcitriol, colace, SSI q 4  hours, novolog 10 units q 4 hours, semglee 30 units daily, protonix, miralax, IV abx  Labs reviewed: BUN 79, creatinine 3.70, phosphorus 6.3 (trending down), hemoglobin 7.3 CBG's: 264-294 x 24 hours  UOP: 320 ml x 24 hours HD net UF: 2286 ml on 9/21 I/O's: -75 ml since admit  Diet Order:   Diet Order             Diet NPO time specified  Diet effective now                   EDUCATION NEEDS:   Not appropriate for education at this time  Skin:  Skin Assessment: Reviewed RN Assessment  Last BM:  10/20/20 large type 6  Height:   Ht Readings from Last 1 Encounters:  10/15/20 _0  (1.626 m)    Weight:   Wt Readings from Last 1 Encounters:  10/20/20 81.6 kg    BMI:  Body mass index is 30.88 kg/m.  Estimated Nutritional Needs:   Kcal:  1800-2000  Protein:  110-130 grams  Fluid:  1000 ml + UOP    Gustavus Bryant, MS, RD, LDN Inpatient Clinical Dietitian Please see AMiON for contact information.

## 2020-10-20 NOTE — Progress Notes (Signed)
Inpatient Diabetes Program Recommendations  AACE/ADA: New Consensus Statement on Inpatient Glycemic Control (2015)  Target Ranges:  Prepandial:   less than 140 mg/dL      Peak postprandial:   less than 180 mg/dL (1-2 hours)      Critically ill patients:  140 - 180 mg/dL   Lab Results  Component Value Date   GLUCAP 281 (H) 10/20/2020   HGBA1C 7.2 (A) 09/29/2020    Review of Glycemic Control Results for SEHAJ, KOLDEN (MRN [id number]) as of 10/20/2020 09:02  Ref. Range 10/19/2020 19:19 10/19/2020 23:21 10/20/2020 03:48 10/20/2020 07:45  Glucose-Capillary Latest Ref Range: 70 - 99 mg/dL 294 (H) 264 (H) 269 (H) 281 (H)   Diabetes history: Type 2 DM Outpatient Diabetes medications: Novolin 70/30 105 units QAM Current orders for Inpatient glycemic control: Semglee 30 units QHS, Novolog 0-15 units Q4H, Novolog 6 units Q4H Vital @ 50 ml/hr   Inpatient Diabetes Program Recommendations:     Consider increasing Novolog 10 units Q4H (for tube feed coverage, tube stopped or held in the event tube feeds are stopped).   Thanks, Bronson Curb, MSN, RNC-OB Diabetes Coordinator [phone] (8a-5p)

## 2020-10-21 ENCOUNTER — Inpatient Hospital Stay (HOSPITAL_COMMUNITY): Payer: HMO

## 2020-10-21 DIAGNOSIS — J9601 Acute respiratory failure with hypoxia: Secondary | ICD-10-CM

## 2020-10-21 DIAGNOSIS — N189 Chronic kidney disease, unspecified: Secondary | ICD-10-CM | POA: Diagnosis not present

## 2020-10-21 DIAGNOSIS — Z452 Encounter for adjustment and management of vascular access device: Secondary | ICD-10-CM | POA: Diagnosis not present

## 2020-10-21 DIAGNOSIS — N179 Acute kidney failure, unspecified: Secondary | ICD-10-CM | POA: Diagnosis not present

## 2020-10-21 DIAGNOSIS — E1169 Type 2 diabetes mellitus with other specified complication: Secondary | ICD-10-CM | POA: Diagnosis not present

## 2020-10-21 LAB — COMPREHENSIVE METABOLIC PANEL
ALT: 189 U/L — ABNORMAL HIGH (ref 0–44)
AST: 202 U/L — ABNORMAL HIGH (ref 15–41)
Albumin: 2.1 g/dL — ABNORMAL LOW (ref 3.5–5.0)
Alkaline Phosphatase: 242 U/L — ABNORMAL HIGH (ref 38–126)
Anion gap: 13 (ref 5–15)
BUN: 77 mg/dL — ABNORMAL HIGH (ref 8–23)
CO2: 27 mmol/L (ref 22–32)
Calcium: 9.1 mg/dL (ref 8.9–10.3)
Chloride: 100 mmol/L (ref 98–111)
Creatinine, Ser: 4.2 mg/dL — ABNORMAL HIGH (ref 0.44–1.00)
GFR, Estimated: 11 mL/min — ABNORMAL LOW (ref >60)
Glucose, Bld: 298 mg/dL — ABNORMAL HIGH (ref 70–99)
Potassium: 3.9 mmol/L (ref 3.5–5.1)
Sodium: 140 mmol/L (ref 135–145)
Total Bilirubin: 0.8 mg/dL (ref 0.3–1.2)
Total Protein: 5.8 g/dL — ABNORMAL LOW (ref 6.5–8.1)

## 2020-10-21 LAB — CBC WITH DIFFERENTIAL/PLATELET
Abs Immature Granulocytes: 0 10*3/uL (ref 0.00–0.07)
Basophils Absolute: 0 10*3/uL (ref 0.0–0.1)
Basophils Relative: 0 %
Eosinophils Absolute: 1.2 10*3/uL — ABNORMAL HIGH (ref 0.0–0.5)
Eosinophils Relative: 4 %
HCT: 25.4 % — ABNORMAL LOW (ref 36.0–46.0)
Hemoglobin: 7.4 g/dL — ABNORMAL LOW (ref 12.0–15.0)
Lymphocytes Relative: 8 %
Lymphs Abs: 2.4 10*3/uL (ref 0.7–4.0)
MCH: 28.8 pg (ref 26.0–34.0)
MCHC: 29.1 g/dL — ABNORMAL LOW (ref 30.0–36.0)
MCV: 98.8 fL (ref 80.0–100.0)
Monocytes Absolute: 2.1 10*3/uL — ABNORMAL HIGH (ref 0.1–1.0)
Monocytes Relative: 7 %
Neutro Abs: 24.6 10*3/uL — ABNORMAL HIGH (ref 1.7–7.7)
Neutrophils Relative %: 81 %
Platelets: 361 10*3/uL (ref 150–400)
RBC: 2.57 MIL/uL — ABNORMAL LOW (ref 3.87–5.11)
RDW: 15.5 % (ref 11.5–15.5)
WBC: 30.4 10*3/uL — ABNORMAL HIGH (ref 4.0–10.5)
nRBC: 10 /100 WBC — ABNORMAL HIGH
nRBC: 5 % — ABNORMAL HIGH (ref 0.0–0.2)

## 2020-10-21 LAB — POCT I-STAT EG7
Acid-Base Excess: 6 mmol/L — ABNORMAL HIGH (ref 0.0–2.0)
Bicarbonate: 35.1 mmol/L — ABNORMAL HIGH (ref 20.0–28.0)
Calcium, Ion: 1.18 mmol/L (ref 1.15–1.40)
HCT: 22 % — ABNORMAL LOW (ref 36.0–46.0)
Hemoglobin: 7.5 g/dL — ABNORMAL LOW (ref 12.0–15.0)
O2 Saturation: 52 %
Patient temperature: 98.3
Potassium: 4 mmol/L (ref 3.5–5.1)
Sodium: 144 mmol/L (ref 135–145)
TCO2: 38 mmol/L — ABNORMAL HIGH (ref 22–32)
pCO2, Ven: 92.7 mmHg (ref 44.0–60.0)
pH, Ven: 7.186 — CL (ref 7.250–7.430)
pO2, Ven: 36 mmHg (ref 32.0–45.0)

## 2020-10-21 LAB — RENAL FUNCTION PANEL
Albumin: 2 g/dL — ABNORMAL LOW (ref 3.5–5.0)
Anion gap: 12 (ref 5–15)
BUN: 69 mg/dL — ABNORMAL HIGH (ref 8–23)
CO2: 28 mmol/L (ref 22–32)
Calcium: 9 mg/dL (ref 8.9–10.3)
Chloride: 100 mmol/L (ref 98–111)
Creatinine, Ser: 3.49 mg/dL — ABNORMAL HIGH (ref 0.44–1.00)
GFR, Estimated: 14 mL/min — ABNORMAL LOW (ref >60)
Glucose, Bld: 183 mg/dL — ABNORMAL HIGH (ref 70–99)
Phosphorus: 5.7 mg/dL — ABNORMAL HIGH (ref 2.5–4.6)
Potassium: 3.2 mmol/L — ABNORMAL LOW (ref 3.5–5.1)
Sodium: 140 mmol/L (ref 135–145)

## 2020-10-21 LAB — CBC
HCT: 22.8 % — ABNORMAL LOW (ref 36.0–46.0)
Hemoglobin: 7.1 g/dL — ABNORMAL LOW (ref 12.0–15.0)
MCH: 29.2 pg (ref 26.0–34.0)
MCHC: 31.1 g/dL (ref 30.0–36.0)
MCV: 93.8 fL (ref 80.0–100.0)
Platelets: 265 10*3/uL (ref 150–400)
RBC: 2.43 MIL/uL — ABNORMAL LOW (ref 3.87–5.11)
RDW: 14.9 % (ref 11.5–15.5)
WBC: 18.7 10*3/uL — ABNORMAL HIGH (ref 4.0–10.5)
nRBC: 2.1 % — ABNORMAL HIGH (ref 0.0–0.2)

## 2020-10-21 LAB — GLUCOSE, CAPILLARY
Glucose-Capillary: 103 mg/dL — ABNORMAL HIGH (ref 70–99)
Glucose-Capillary: 161 mg/dL — ABNORMAL HIGH (ref 70–99)
Glucose-Capillary: 209 mg/dL — ABNORMAL HIGH (ref 70–99)
Glucose-Capillary: 231 mg/dL — ABNORMAL HIGH (ref 70–99)
Glucose-Capillary: 239 mg/dL — ABNORMAL HIGH (ref 70–99)
Glucose-Capillary: 249 mg/dL — ABNORMAL HIGH (ref 70–99)
Glucose-Capillary: 270 mg/dL — ABNORMAL HIGH (ref 70–99)

## 2020-10-21 LAB — POCT I-STAT 7, (LYTES, BLD GAS, ICA,H+H)
Acid-Base Excess: 4 mmol/L — ABNORMAL HIGH (ref 0.0–2.0)
Bicarbonate: 30 mmol/L — ABNORMAL HIGH (ref 20.0–28.0)
Calcium, Ion: 1.17 mmol/L (ref 1.15–1.40)
HCT: 25 % — ABNORMAL LOW (ref 36.0–46.0)
Hemoglobin: 8.5 g/dL — ABNORMAL LOW (ref 12.0–15.0)
O2 Saturation: 100 %
Patient temperature: 99.2
Potassium: 4.3 mmol/L (ref 3.5–5.1)
Sodium: 139 mmol/L (ref 135–145)
TCO2: 31 mmol/L (ref 22–32)
pCO2 arterial: 50.3 mmHg — ABNORMAL HIGH (ref 32.0–48.0)
pH, Arterial: 7.385 (ref 7.350–7.450)
pO2, Arterial: 309 mmHg — ABNORMAL HIGH (ref 83.0–108.0)

## 2020-10-21 LAB — MAGNESIUM
Magnesium: 2.2 mg/dL (ref 1.7–2.4)
Magnesium: 2.6 mg/dL — ABNORMAL HIGH (ref 1.7–2.4)

## 2020-10-21 LAB — PHOSPHORUS: Phosphorus: 9.9 mg/dL — ABNORMAL HIGH (ref 2.5–4.6)

## 2020-10-21 LAB — LACTIC ACID, PLASMA: Lactic Acid, Venous: 2.6 mmol/L (ref 0.5–1.9)

## 2020-10-21 MED ORDER — ETOMIDATE 2 MG/ML IV SOLN
INTRAVENOUS | Status: AC
  Administered 2020-10-21: 20 mg via INTRAVENOUS
  Filled 2020-10-21: qty 20

## 2020-10-21 MED ORDER — NOREPINEPHRINE 16 MG/250ML-% IV SOLN: 0.0000 ug/min | INTRAVENOUS | Status: DC

## 2020-10-21 MED ORDER — ROCURONIUM BROMIDE 50 MG/5ML IV SOLN: 100.0000 mg | Freq: Once | INTRAVENOUS | Status: AC

## 2020-10-21 MED ORDER — PRISMASOL BGK 4/2.5 32-4-2.5 MEQ/L EC SOLN
Status: DC
  Filled 2020-10-21 (×36): qty 5000

## 2020-10-21 MED ORDER — MIDAZOLAM HCL 2 MG/2ML IJ SOLN
INTRAMUSCULAR | Status: AC
  Filled 2020-10-21: qty 2

## 2020-10-21 MED ORDER — NOREPINEPHRINE 4 MG/250ML-% IV SOLN: 0.0000 ug/min | INTRAVENOUS | Status: DC

## 2020-10-21 MED ORDER — HEPARIN SODIUM (PORCINE) 1000 UNIT/ML DIALYSIS
1000.0000 [IU] | INTRAMUSCULAR | Status: DC | PRN
  Administered 2020-10-21: 2600 [IU] via INTRAVENOUS_CENTRAL
  Administered 2020-10-21 (×2): 2800 [IU] via INTRAVENOUS_CENTRAL
  Filled 2020-10-21 (×3): qty 6
  Filled 2020-10-21 (×4): qty 3
  Filled 2020-10-21: qty 6

## 2020-10-21 MED ORDER — PRISMASOL BGK 4/2.5 32-4-2.5 MEQ/L REPLACEMENT SOLN
Status: DC
  Filled 2020-10-21 (×9): qty 5000

## 2020-10-21 MED ORDER — SODIUM CHLORIDE 0.9 % FOR CRRT: INTRAVENOUS_CENTRAL | Status: DC | PRN

## 2020-10-21 MED ORDER — FENTANYL CITRATE (PF) 100 MCG/2ML IJ SOLN
25.0000 ug | INTRAMUSCULAR | Status: DC | PRN
  Administered 2020-10-21 – 2020-10-24 (×8): 100 ug via INTRAVENOUS
  Administered 2020-10-24: 50 ug via INTRAVENOUS
  Administered 2020-10-24 (×2): 100 ug via INTRAVENOUS
  Administered 2020-10-24: 50 ug via INTRAVENOUS
  Administered 2020-10-24 – 2020-10-26 (×11): 100 ug via INTRAVENOUS
  Filled 2020-10-21 (×24): qty 2

## 2020-10-21 MED ORDER — FENTANYL CITRATE (PF) 100 MCG/2ML IJ SOLN
INTRAMUSCULAR | Status: AC
  Filled 2020-10-21: qty 2

## 2020-10-21 MED ORDER — ETOMIDATE 2 MG/ML IV SOLN: 20.0000 mg | Freq: Once | INTRAVENOUS | Status: AC

## 2020-10-21 MED ORDER — INSULIN GLARGINE-YFGN 100 UNIT/ML ~~LOC~~ SOLN
15.0000 [IU] | Freq: Every day | SUBCUTANEOUS | Status: DC
  Administered 2020-10-21 – 2020-10-22 (×2): 15 [IU] via SUBCUTANEOUS
  Filled 2020-10-21 (×4): qty 0.15

## 2020-10-21 MED ORDER — ROCURONIUM BROMIDE 10 MG/ML (PF) SYRINGE
PREFILLED_SYRINGE | INTRAVENOUS | Status: AC
  Administered 2020-10-21: 100 mg via INTRAVENOUS
  Filled 2020-10-21: qty 10

## 2020-10-21 MED ORDER — PROPOFOL 1000 MG/100ML IV EMUL
0.0000 ug/kg/min | INTRAVENOUS | Status: DC
  Administered 2020-10-21: 5 ug/kg/min via INTRAVENOUS
  Administered 2020-10-21: 30 ug/kg/min via INTRAVENOUS
  Administered 2020-10-22 (×3): 40 ug/kg/min via INTRAVENOUS
  Administered 2020-10-22: 30 ug/kg/min via INTRAVENOUS
  Administered 2020-10-22: 40 ug/kg/min via INTRAVENOUS
  Administered 2020-10-23 (×2): 35 ug/kg/min via INTRAVENOUS
  Administered 2020-10-23: 40 ug/kg/min via INTRAVENOUS
  Administered 2020-10-24 (×2): 30 ug/kg/min via INTRAVENOUS
  Administered 2020-10-24: 5 ug/kg/min via INTRAVENOUS
  Administered 2020-10-25: 20 ug/kg/min via INTRAVENOUS
  Administered 2020-10-25 (×2): 30 ug/kg/min via INTRAVENOUS
  Administered 2020-10-26: 40 ug/kg/min via INTRAVENOUS
  Administered 2020-10-26: 35 ug/kg/min via INTRAVENOUS
  Administered 2020-10-26: 40 ug/kg/min via INTRAVENOUS
  Filled 2020-10-21 (×21): qty 100

## 2020-10-21 MED ORDER — KETAMINE HCL 50 MG/5ML IJ SOSY
PREFILLED_SYRINGE | INTRAMUSCULAR | Status: AC
  Filled 2020-10-21: qty 5

## 2020-10-21 MED ORDER — LIDOCAINE HCL (PF) 1 % IJ SOLN
INTRAMUSCULAR | Status: AC
  Filled 2020-10-21: qty 5

## 2020-10-21 MED ORDER — POTASSIUM CHLORIDE 20 MEQ PO PACK
20.0000 meq | PACK | Freq: Once | ORAL | Status: AC
  Administered 2020-10-21: 20 meq
  Filled 2020-10-21: qty 1

## 2020-10-21 MED ORDER — NOREPINEPHRINE 16 MG/250ML-% IV SOLN
0.0000 ug/min | INTRAVENOUS | Status: DC
Start: 2020-10-21 — End: 2020-10-23
  Administered 2020-10-21: 28 ug/min via INTRAVENOUS

## 2020-10-21 NOTE — Progress Notes (Addendum)
NAME:  Krista Strickland, MRN:  [id number], DOB:  [birth year], LOS: 9 ADMISSION DATE:  10/22/2020, CONSULTATION DATE: 10/15/2020 REFERRING MD: Dr. Roger Shelter, CHIEF COMPLAINT: Cardiac arrest  History of Present Illness:  Krista Strickland is a 70 y.o. female with a past medical history significant for CKD stage V, hypertension, type 2 diabetes, GERD, hyperlipidemia, and hypertrophic cardiomyopathy who presented to the emergency department) with left lower quadrant abdominal pain that began day of admission.  Of note patient had renal biopsy 10/07/2020.  On ED arrival patient was seen hypertensive with all other vital signs within normal limits.  Due to abdominal pain complaints with recent liver biopsy CT abdomen pelvis was obtained and revealed 8 x 4 x 12 cm left present subscapular hematoma for which intervention radiology was consulted and recommended monitoring but if symptoms worsen and arteriogram could be considered.  Late evening of 9/16 patient suffered cardiac arrest.  After returning from the restroom patient had episode dyspnea followed by bradycardia and later asystole.  She received 1 mg of epi with ACLS/CPR with ROSC achieved and was seen with spontaneous respirations but significantly diminished mentation resulting in decision to endotracheally intubate.  Given sudden onset of dyspnea post ambulation with resultant bradycardia concern for PE as cause of cardiac arrest was very high given renal function decision was made to forego CTA and began therapeutic heparin.  Heparin drip was later discontinued due to downtrending of hemoglobin.  PCCM was consulted for further management and decision was made to transfer patient to [facility] for high-level care.  She remains critically ill  Significant Hospital Events: Including procedures, antibiotic start and stop dates in addition to other pertinent events   9/13 admitted to [facility] with abdominal pain found to have a subcapsular left renal  hematoma post biopsy 9/16 cardiac arrest likely due to aspiration 9/18 passed SBT but copious secretion 9/20 non-tunneled dialysis catheter placed and HD initiated  Interim History / Subjective:  Tmax 99.3  1.4 L out in HD overnight -73ml admission  Intubated  On 0.6mg  dex  Unable to obtain subjective evaluation due to patient status  Objective   Blood pressure (!) 154/58, pulse 70, temperature 99.3 F (37.4 C), temperature source Axillary, resp. rate (!) 24, height 5\' 4"  (1.626 m), weight 84.3 kg, SpO2 100 %.    Vent Mode: PSV;CPAP FiO2 (%):  [40 %] 40 % Set Rate:  [18 bmp] 18 bmp Vt Set:  [440 mL] 440 mL PEEP:  [5 cmH20] 5 cmH20 Pressure Support:  [5 cmH20] 5 cmH20 Plateau Pressure:  [10 cmH20-18 cmH20] 10 cmH20   Intake/Output Summary (Last 24 hours) at 10/21/2020 4650 Last data filed at 10/21/2020 0700 Gross per 24 hour  Intake 2056.76 ml  Output 1450 ml  Net 606.76 ml   Filed Weights   10/19/20 2235 10/20/20 0144 10/21/20 0500  Weight: 83.8 kg 81.6 kg 84.3 kg    Examination: General:  in bed, NAD, appears comfortable HEENT: MM pink/moist, anicteric, atraumatic Neuro: GCS 11t, RASS 0, Rt eye reactive CV: S1S2,  NSR, no m/r/g appreciated PULM:  air movement in all lobes, Trachea midline, chest expansion symmetric GI: soft, bsx4 active/hypoactive   Extremities: warm/dry, generalized edema, capillary refill less than 3 seconds  Skin: no rashes or lesions  Resolved Hospital Problem list   Demand cardiac ischemia Chronic hyponatremia Septic shock due to aspiration pneumonia Assessment & Plan:  S/p PEA cardiac arrest due to hypoxia  Acute respiratory failure with hypoxia Secondary to volume  overload d/t renal dysfunction. Dialyzed x2 days. CXR shows small right effusion, improved aeration RUL. Tolerating SBT this morning and could possibly extubate today.  -LTVV strategy with tidal volumes of 4-8 cc/kg ideal body weight -Goal plateau pressures less than 30 and  driving pressures less than 15 -Wean PEEP/FiO2 for SpO2 92-98% -VAP bundle -Daily SAT and SBT -PAD bundle with Precedex gtt  -RASS goal 0 to -1 -Follow intermittent CXR and ABG PRN  AKI CKD stage V now CKD stage Vd High anion gap metabolic acidosis due to kidney disease Underwent HD on 9/21 and 9/22. 1.4L out overnight. Creat 3.7>3.49 BUN 79>69 -Neph following -Planning for HD again on 9/23  Leukocytosis 21.4>18.7. Suspect reactive from hematoma. Unasyn for asp pneumonia completed 9/22.  -Follow fever/wbc curve   Type 2 diabetes On TF. BG 160-288 -Blood Glucose goal 140-180. -SSI and 10U TF coverage -Semglee 30 Units qhs and added 15 U in AM  Perinephric hematoma post renal biopsy -Hemoglobin is stable at 7.3  Anemia of CKD  acute blood loss anemia, normocytic HGB 7.3>7.1, No signs of active bleeding -Transfuse PRBC if HBG less than 7 -Obtain AM CBC to trend H&H -Monitor for signs of bleeding  Hypoalbuminemia with mild protein calorie malnutrition -continue TF  HX HOCM HX Hypertension HX Hyperlipidemia -Continue zetia, statin -Continue dilt, metop, and hydralazine  -Holding on further GDMT due to critical illness.  Best Practice    Diet/type: tubefeeds DVT prophylaxis: SCD and subq heparin 5000 units q8hours GI prophylaxis: PPI Lines: Central line Foley:  Yes, and it is still needed Code Status:  full code Last date of multidisciplinary goals of care discussion: pending  Critical Care time: 31 minutes   Redmond School., MSN, APRN, AGACNP-BC [facility]  10/21/2020 , 9:22 AM  Please see [email or web address] for pager details  If no response, please call [phone] After hours, please call Elink at [phone]

## 2020-10-21 NOTE — Procedures (Signed)
Extubation Procedure Note  Patient Details:   Name: Krista Strickland DOB: [birth year] MRN: [id number]   Airway Documentation:    Vent end date: 10/21/20 Vent end time: 1050   Evaluation  O2 sats: stable throughout Complications: No apparent complications Patient did tolerate procedure well. Bilateral Breath Sounds: Clear, Diminished   Yes Placed on 4L min  Incentive spirometer instructed  Revonda Standard 10/21/2020, 10:50 AM

## 2020-10-21 NOTE — Progress Notes (Signed)
Patient reintubated    CPR after extubation earlier this morning  ROSC achieved after 4 minutes  BP 165/80  Pulse 81  T 99  Sats 100 %  Fio2 100 %  pH 7.18  K 4   Na 144    Hb 7.5  Will plan CRRT  discussed with nursing

## 2020-10-21 NOTE — Progress Notes (Signed)
RT note-Patient placed back to Bipap for moderate distress, RR in the 30's, low air movement, continue to monitor.

## 2020-10-21 NOTE — Procedures (Signed)
Intubation Procedure Note  ZYRAH WISWELL  [id number]  1950/08/06  Date:10/21/20  Time:1:42 PM   Provider Performing:Kobe Ofallon B Kieran Nachtigal    Procedure: Intubation (86754)  Indication(s) Respiratory Failure  Consent Unable to obtain consent due to emergent nature of procedure.   Anesthesia Etomidate and Rocuronium   Time Out Verified patient identification, verified procedure, site/side was marked, verified correct patient position, special equipment/implants available, medications/allergies/relevant history reviewed, required imaging and test results available.   Sterile Technique Usual hand hygeine, masks, and gloves were used   Procedure Description Patient positioned in bed supine.  Sedation given as noted above.  Patient was intubated with endotracheal tube using Glidescope.  View was Grade 1 full glottis .  Number of attempts was  2 .   Colorimetric CO2 detector was consistent with tracheal placement.   Complications/Tolerance Difficulty with initial attempt due to closed vocal cords. Assistance from anesthesia on the second attempt with successful placement of the ET tube. Chest X-ray is ordered to verify placement.   EBL Minimal   Specimen(s) None

## 2020-10-21 NOTE — Procedures (Signed)
Cardiopulmonary Resuscitation Note  RUBLE BUTTLER  [id number]  [birth year]  Date:10/21/20  Time:1:23 PM   Provider Performing:Emmie Frakes Cleon Dew   Procedure: Cardiopulmonary Resuscitation [phone])  Indication(s) Loss of Pulse at 1244  Consent N/A  Anesthesia Present for intubation  Time Out N/A  Sterile Technique Hand hygiene, gloves  Procedure Description Called to patient's room for CODE BLUE. Initial rhythm was PEA/Asystole. Patient received high quality chest compressions for 2 minutes with defibrillation or cardioversion when appropriate. Epinephrine was administered immediately and every 3 minutes as directed by time Therapist, nutritional. Additional procedural interventions include intubation.  Return of spontaneous circulation was achieved.  Family unable to be notified. Called Mar Zettler at [phone]. At 1319 two times. Message left.   Complications/Tolerance N/A   EBL N/A   Specimen(s) N/A  Estimated time to ROSC: 4 minutes. Arrest x2, CPR 2 minutes each. See procedure record.  Redmond School., MSN, APRN, AGACNP-BC [facility]  10/21/2020 , 1:27 PM  Please see [email or web address] for pager details  If no response, please call [phone] After hours, please call Elink at [phone]

## 2020-10-21 NOTE — Procedures (Signed)
Central Venous Catheter Insertion Procedure Note  Krista Strickland  [id number]  09/11/1950  Date:10/21/20  Time:11:34 PM   Provider Performing:Mariza Bourget D Rollene Rotunda   Procedure: Insertion of Non-tunneled Central Venous Catheter(36556)with US guidance (67124)    Indication(s) Hemodialysis  Consent Risks of the procedure as well as the alternatives and risks of each were explained to the patient and/or caregiver.  Consent for the procedure was obtained and is signed in the bedside chart  Anesthesia Topical only with 1% lidocaine   Timeout Verified patient identification, verified procedure, site/side was marked, verified correct patient position, special equipment/implants available, medications/allergies/relevant history reviewed, required imaging and test results available.  Sterile Technique Maximal sterile technique including full sterile barrier drape, hand hygiene, sterile gown, sterile gloves, mask, hair covering, sterile ultrasound probe cover (if used).  Procedure Description Area of catheter insertion was cleaned with chlorhexidine and draped in sterile fashion.   With real-time ultrasound guidance a HD catheter was placed into the right internal jugular vein.  Nonpulsatile blood flow and easy flushing noted in all ports.  The catheter was sutured in place and sterile dressing applied.     Complications/Tolerance None; patient tolerated the procedure well. Chest X-ray is ordered to verify placement for internal jugular or subclavian cannulation.  Chest x-ray is not ordered for femoral cannulation.  EBL Minimal  Specimen(s) None  JD Rexene Agent  Pulmonary & Critical Care 10/21/2020, 11:34 PM  Please see [email or web address] for pager details.  From 7A-7P if no response, please call [phone]. After hours, please call ELink [phone].

## 2020-10-21 NOTE — Progress Notes (Signed)
[facility] ROUNDING NOTE   Subjective:   Interval History: 70 year old lady with stage IV chronic kidney disease diabetes mellitus type 2 hyperlipidemia hypertrophic cardiomyopathy presents to the emergency department left lower quadrant pain status postbiopsy 10/07/2020.  Was found to have 8 x 4 x 12 cm subcapsular hematoma.  Late evening 10/15/2018.  With cardiac arrest received 1 mg of epi with ACLS/CPR with ROSC achieved.  Concerns for pulmonary embolus.  Heparin initiated.  Transferred from [facility] to [facility].    Urine output 700 cc 10/18/2020 underwent dialysis 10/19/2020 with 1.5 L, 10/20/2021 with 2.5 L removed .  Next dialysis will be 10/21/2020  Chest x-ray left pleural effusion with increased interstitial and airspace densities   Blood pressure 154/58 pulse 86 temperature 99 O2 sats 99% FiO2 40%  Sodium 140 potassium 3.2 chloride 100 CO2 28 BUN 69 creatinine 3.49 glucose 183 calcium 9 phosphorus 5.7 hemoglobin 7.2    Objective:  Vital signs in last 24 hours:  Temp:  [97.8 F (36.6 C)-99.3 F (37.4 C)] 99.3 F (37.4 C) (09/23 0734) Pulse Rate:  [55-85] 70 (09/23 0700) Resp:  [17-31] 24 (09/23 0700) BP: (75-179)/(48-115) 154/58 (09/23 0700) SpO2:  [84 %-100 %] 99 % (09/23 0700) FiO2 (%):  [40 %] 40 % (09/23 0323) Weight:  [84.3 kg] 84.3 kg (09/23 0500)  Weight change: 0.5 kg Filed Weights   10/19/20 2235 10/20/20 0144 10/21/20 0500  Weight: 83.8 kg 81.6 kg 84.3 kg    Intake/Output: I/O last 3 completed shifts: In: 3409.3 [I.V.:709.3; NG/GT:2400; IV Piggyback:300] Out: 1610 [Urine:200; Other:3736]   Intake/Output this shift:  No intake/output data recorded.  General:intubated, sedated Heart:RRR Lungs:intuabted, bl chest expansion, no crackles Abdomen:soft, Non-tender, non-distended Extremities: Bilateral ankle edema present+ Neuro: sedated   Basic Metabolic Panel: Recent Labs  Lab 10/16/20 1723 10/17/20 0423 10/17/20 1650  10/18/20 0650 10/19/20 0508 10/20/20 0526 10/21/20 0528  NA  --  131*  --  135 138 138 140  K  --  3.9  --  4.3 3.8 3.8 3.2*  CL  --  95*  --  97* 98 99 100  CO2  --  16*  --  19* _0 GLUCOSE  --  188*  --  274* 238* 302* 183*  BUN  --  122*  --  145* 99* 79* 69*  CREATININE  --  5.63*  --  6.12* 4.30* 3.70* 3.49*  CALCIUM  --  8.6*  --  8.6* 8.9 8.9 9.0  MG 2.2 2.5* 2.5*  --   --  2.3 2.2  PHOS 10.5* 10.9* 10.4*  --  6.7* 6.3* 5.7*     Liver Function Tests: Recent Labs  Lab 10/14/20 2322 10/15/20 0253 10/19/20 0508 10/20/20 0526 10/21/20 0528  AST 133* 183*  --   --   --   ALT 101* 121*  --   --   --   ALKPHOS 83 97  --   --   --   BILITOT 0.7 1.2  --   --   --   PROT 6.3* 6.5  --   --   --   ALBUMIN 3.2* 3.3* 2.1* 2.0* 2.0*    No results for input(s): LIPASE, AMYLASE in the last 168 hours.  No results for input(s): AMMONIA in the last 168 hours.  CBC: Recent Labs  Lab 10/18/20 0305 10/18/20 0650 10/19/20 0508 10/20/20 0526 10/21/20 0528  WBC 19.7* 18.1* 20.8* 21.4* 18.7*  HGB 7.4* 7.2*  7.7* 7.3* 7.1*  HCT 23.1* 22.3* 23.8* 23.0* 22.8*  MCV 88.2 88.5 88.8 91.3 93.8  PLT 266 264 301 304 265     Cardiac Enzymes: No results for input(s): CKTOTAL, CKMB, CKMBINDEX, TROPONINI in the last 168 hours.  BNP: Invalid input(s): POCBNP  CBG: Recent Labs  Lab 10/20/20 1518 10/20/20 1932 10/20/20 2340 10/21/20 0330 10/21/20 0733  GLUCAP 247* 160* 222* 249* 231*     Microbiology: Results for orders placed or performed during the hospital encounter of 10/03/2020  Resp Panel by RT-PCR (Flu A&B, Covid) Nasopharyngeal Swab     Status: None   Collection Time: 10/10/2020  9:07 PM   Specimen: Nasopharyngeal Swab; Nasopharyngeal(NP) swabs in vial transport medium  Result Value Ref Range Status   SARS Coronavirus 2 by RT PCR NEGATIVE NEGATIVE Final    Comment: (NOTE) SARS-CoV-2 target nucleic acids are NOT DETECTED.  The SARS-CoV-2 RNA is generally  detectable in upper respiratory specimens during the acute phase of infection. The lowest concentration of SARS-CoV-2 viral copies this assay can detect is 138 copies/mL. A negative result does not preclude SARS-Cov-2 infection and should not be used as the sole basis for treatment or other patient management decisions. A negative result may occur with  improper specimen collection/handling, submission of specimen other than nasopharyngeal swab, presence of viral mutation(s) within the areas targeted by this assay, and inadequate number of viral copies(<138 copies/mL). A negative result must be combined with clinical observations, patient history, and epidemiological information. The expected result is Negative.  Fact Sheet for Patients:  EntrepreneurPulse.com.au  Fact Sheet for Healthcare Providers:  IncredibleEmployment.be  This test is no t yet approved or cleared by the [place] FDA and  has been authorized for detection and/or diagnosis of SARS-CoV-2 by FDA under an Emergency Use Authorization (EUA). This EUA will remain  in effect (meaning this test can be used) for the duration of the COVID-19 declaration under Section 564(b)(1) of the Act, 21 U.S.C.section 360bbb-3(b)(1), unless the authorization is terminated  or revoked sooner.       Influenza A by PCR NEGATIVE NEGATIVE Final   Influenza B by PCR NEGATIVE NEGATIVE Final    Comment: (NOTE) The Xpert Xpress SARS-CoV-2/FLU/RSV plus assay is intended as an aid in the diagnosis of influenza from Nasopharyngeal swab specimens and should not be used as a sole basis for treatment. Nasal washings and aspirates are unacceptable for Xpert Xpress SARS-CoV-2/FLU/RSV testing.  Fact Sheet for Patients: EntrepreneurPulse.com.au  Fact Sheet for Healthcare Providers: IncredibleEmployment.be  This test is not yet approved or cleared by the [place] FDA  and has been authorized for detection and/or diagnosis of SARS-CoV-2 by FDA under an Emergency Use Authorization (EUA). This EUA will remain in effect (meaning this test can be used) for the duration of the COVID-19 declaration under Section 564(b)(1) of the Act, 21 U.S.C. section 360bbb-3(b)(1), unless the authorization is terminated or revoked.  Performed at [facility], [address], [place], [place] [address]   MRSA Next Gen by PCR, Nasal     Status: None   Collection Time: 10/12/20  5:00 PM   Specimen: Nasal Mucosa; Nasal Swab  Result Value Ref Range Status   MRSA by PCR Next Gen NOT DETECTED NOT DETECTED Final    Comment: (NOTE) The GeneXpert MRSA Assay (FDA approved for NASAL specimens only), is one component of a comprehensive MRSA colonization surveillance program. It is not intended to diagnose MRSA infection nor to guide or monitor treatment for MRSA  infections. Test performance is not FDA approved in patients less than 67 years old. Performed at [facility], [address] [place], [place] [address]   Urine Culture     Status: None   Collection Time: 10/15/20  4:51 AM   Specimen: Urine, Catheterized  Result Value Ref Range Status   Specimen Description   Final    URINE, CATHETERIZED Performed at [facility], [address], [place], [place] [address]    Special Requests   Final    NONE Performed at [facility], [address], [place], [place] [address]    Culture   Final    NO GROWTH Performed at [facility], [place] [address] [place], [place] [address]    Report Status 10/16/2020 FINAL  Final  Culture, blood (x 2)     Status: None   Collection Time: 10/15/20  5:02 AM   Specimen: BLOOD LEFT WRIST  Result Value Ref Range Status   Specimen Description   Final    BLOOD LEFT WRIST BOTTLES DRAWN AEROBIC AND ANAEROBIC   Special Requests Blood Culture adequate volume  Final   Culture   Final    NO GROWTH 5 DAYS Performed at [facility], [address] [place], [place] [address]    Report Status 10/20/2020 FINAL  Final  Culture, blood (x 2)     Status: None   Collection Time: 10/15/20  5:13 AM   Specimen: BLOOD LEFT ARM  Result Value Ref Range Status   Specimen Description BLOOD LEFT ARM BOTTLES DRAWN AEROBIC AND ANAEROBIC  Final   Special Requests Blood Culture adequate volume  Final   Culture   Final    NO GROWTH 5 DAYS Performed at [facility], [address] [place], [place] [address]    Report Status 10/20/2020 FINAL  Final  Culture, Respiratory w Gram Stain     Status: None   Collection Time: 10/16/20  8:52 AM   Specimen: Tracheal Aspirate; Respiratory  Result Value Ref Range Status   Specimen Description TRACHEAL ASPIRATE  Final   Special Requests NONE  Final   Gram Stain   Final    RARE WBC PRESENT, PREDOMINANTLY PMN RARE GRAM POSITIVE COCCI IN PAIRS    Culture   Final    RARE Normal respiratory flora-no Staph aureus or Pseudomonas seen Performed at [facility], [address], [place], [place] [address]    Report Status 10/18/2020 FINAL  Final    Coagulation Studies: No results for input(s): LABPROT, INR in the last 72 hours.   Urinalysis: No results for input(s): COLORURINE, LABSPEC, PHURINE, GLUCOSEU, HGBUR, BILIRUBINUR, KETONESUR, PROTEINUR, UROBILINOGEN, NITRITE, LEUKOCYTESUR in the last 72 hours.  Invalid input(s): APPERANCEUR     Imaging: No results found.   Medications:    sodium chloride 5 mL/hr at 10/21/20 0700   sodium chloride     sodium chloride     dexmedetomidine (PRECEDEX) IV infusion 1.2 mcg/kg/hr (10/21/20 0700)   feeding supplement (VITAL 1.5 CAL) 1,000 mL (10/20/20 1759)    albuterol  2.5 mg Nebulization BID   calcitRIOL  0.25 mcg Per Tube Daily   chlorhexidine gluconate (MEDLINE KIT)  15 mL Mouth Rinse BID   Chlorhexidine Gluconate Cloth  6 each Topical Daily   diltiazem  60 mg Per Tube Q8H   docusate  100 mg Per Tube BID   ezetimibe  10 mg Per Tube Daily   febuxostat   40 mg Per NG tube Daily  feeding supplement (PROSource TF)  45 mL Per Tube TID   gabapentin  200 mg Per Tube Q12H   heparin injection (subcutaneous)  5,000 Units Subcutaneous Q8H   hydrALAZINE  50 mg Per Tube Q8H   influenza vaccine adjuvanted  0.5 mL Intramuscular Tomorrow-1000   insulin aspart  0-15 Units Subcutaneous Q4H   insulin aspart  10 Units Subcutaneous Q4H   insulin glargine-yfgn  30 Units Subcutaneous QHS   mouth rinse  15 mL Mouth Rinse 10 times per day   metoprolol tartrate  75 mg Per NG tube BID   multivitamin  1 tablet Per Tube QHS   pantoprazole sodium  40 mg Per Tube Daily   polyethylene glycol  17 g Per Tube Daily   pravastatin  20 mg Per NG tube QAC breakfast   prednisoLONE acetate  1 drop Left Eye BID   sodium chloride flush  10-40 mL Intracatheter Q12H   sodium chloride, sodium chloride, albuterol, alteplase, fentaNYL (SUBLIMAZE) injection, heparin, hydrALAZINE, HYDROcodone-acetaminophen, lidocaine (PF), lidocaine-prilocaine, ondansetron (ZOFRAN) IV, pentafluoroprop-tetrafluoroeth, sodium chloride flush  Assessment/ Plan:  1.Acute kidney injury on CKD stage IV: Patient has longstanding CKD with baseline creatinine level seems to be around 2.6-2.9 however recently the GFR declined and creatinine level around 3.6 as outpatient.  Reportedly the biopsy result came back hypertensive and diabetic changes with severe interstitial fibrosis and tubular atrophy with mostly chronic changes.  -Discussed with critical care medicine.  Will initiate hemodialysis 10/19/2020 patient is clearly volume overloaded with significant azotemia.  Would be able to stop diuretics once initiated on dialysis.  Patient underwent successful dialysis 10/19/2020 and 10/20/2021 Next dialysis treatment 10/21/2021.     2 Post biopsy left kidney hematoma: 8 x 4 x 12 cm subcapsular hematoma along the left kidney leading to mass-effect on the renal parenchyma.  IR was consulted, no plan for embolization.   Continue to monitor CBC and imaging studies. -Transfuse as necessary.   3 Cardiac arrest 9/16-asystole -transferred from [facility] post arrest -brief course of CPR, 1 round of epi     4 Hyponatremia, resolved at this point  6 Metabolic acidosis: Initiating hemodialysis should be able to stop oral sodium bicarbonate   7 Hypertension, now hypotensive: pressor support per primary service   8 Anemia of CKD and blood loss postbiopsy: Monitor CBC and transfuse as needed.   LOS: [place] _0 _1 :57 AM

## 2020-10-21 NOTE — Progress Notes (Signed)
CPT held temporarily per RN until CRRT is set up. RT will cont to monitor.

## 2020-10-21 NOTE — Progress Notes (Signed)
eLink Physician-Brief Progress Note Patient Name: [name] DOB: [birth year] MRN: [id number]   Date of Service  10/21/2020  HPI/Events of Note  Request to review CXR for HD cath placement. L IJ HD cath at [facility]. No pneumothorax.  eICU Interventions  Plan: OK to use L IJ HD catheter.      Intervention Category Major Interventions: Other:  Lysle Dingwall 10/21/2020, 8:00 PM

## 2020-10-21 NOTE — Code Documentation (Signed)
Upon reassessing patient, noticed patient unresponsive to pain, provider paged to room, patient's heart rated dropped to 33, no pulse palpable, code initiated, CPR started. See code sheet for remaining actions.

## 2020-10-21 NOTE — Progress Notes (Signed)
Responded to Code blue to support pt. And staff.  Pt recovered and was intubated.  Provided emotional and spiritual support and ministry of presence to both staff and patient.  Chaplain available as needed.    Jaclynn Major, [place], [facility], Pager [phone]

## 2020-10-21 NOTE — Progress Notes (Addendum)
PCCM Brief Progress Note  S:  Paged to bedside at 1242 by Verdene Lennert RN regarding  Tamera Punt no longer responding to pain. Ms. Viar was extubated today at 75 became increasingly tachypnic and was placed on BiPAP at 1226. On arrival patient found to be undergoing CPR. See procedure documentation and note for more detail. ROSC was achieved at 1254 with NSR. Arrest x2 with ~ 4 minutes CPR.  O: BP (!) 156/64   Pulse 81   Temp 99.6 F (37.6 C) (Axillary)   Resp (!) 23   Ht (P) 5\' 4"  (1.626 m)   Wt 84.3 kg   SpO2 94%   BMI (P) 31.90 kg/m   Neuro: Unresponsive, recently intubated with rocuronium CV: Afib, RRR, no murmurs PULM: Air movement in both lobes.  Vent Mode: PRVC FiO2 (%):  [40 %-100 %] 100 % Set Rate:  [15 bmp-18 bmp] 18 bmp Vt Set:  [440 mL] 440 mL PEEP:  [5 cmH20-7 cmH20] 5 cmH20 Pressure Support:  [5 cmH20] 5 cmH20 Plateau Pressure:  [10 cmH20-23 cmH20] 23 cmH20   A: P:  In hospital PEA cardiac arrest Acute respiratory failure with hypoxia New onset afib Suspect secondary to hypoxia. Arrested again peri-intubation when patient lost ventilation and sats dropped. Patient received etomidate and rocuronium for intubation.  4 minutes CPR total. Possible fluid volume overload component after loss of positive pressure ventilation -STAT CXR -STAT 12 lead -STAT CBC, CMP, MG, LACTATE -ABG in 1 hour -Start levophed. Goal MAP 65 or greater. -LTVV strategy with tidal volumes of 4-8 cc/kg ideal body weight -Goal plateau pressures less than 30 and driving pressures less than 15 -Wean PEEP/FiO2 for SpO2 92-98% -VAP bundle -Daily SAT and SBT -PAD bundle with Propofol gtt and fentanyl push -RASS goal 0 to -1 -Will evaluate neuro exam post intubation -Head CT when stable for travel -No AC at this time.   Gilmore Laroche and sister Joyice Faster contacted and updated at [phone]. Updated on plan and all questions answered.   Redmond School., MSN, APRN,  AGACNP-BC [facility]  10/21/2020 , 1:43 PM  Please see [email or web address] for pager details  If no response, please call [phone] After hours, please call Elink at [phone]

## 2020-10-21 NOTE — Progress Notes (Signed)
SLP Cancellation Note  Patient Details Name: Krista Strickland MRN: [id number] DOB: [birth year]   Cancelled treatment:       Reason Eval/Treat Not Completed: Medical issues which prohibited therapy. Bedside swallow eval ordered post extubation. Pt coded and now re intubated. SLP will s/o.    Ellwood Dense, [place], [place] [facility] Office Number: [phone]  Acie Fredrickson 10/21/2020, 1:48 PM

## 2020-10-22 DIAGNOSIS — J9601 Acute respiratory failure with hypoxia: Secondary | ICD-10-CM | POA: Diagnosis not present

## 2020-10-22 LAB — MAGNESIUM: Magnesium: 2.5 mg/dL — ABNORMAL HIGH (ref 1.7–2.4)

## 2020-10-22 LAB — RENAL FUNCTION PANEL
Albumin: 2.1 g/dL — ABNORMAL LOW (ref 3.5–5.0)
Albumin: 2.2 g/dL — ABNORMAL LOW (ref 3.5–5.0)
Anion gap: 9 (ref 5–15)
Anion gap: 11 (ref 5–15)
BUN: 62 mg/dL — ABNORMAL HIGH (ref 8–23)
BUN: 38 mg/dL — ABNORMAL HIGH (ref 8–23)
CO2: 27 mmol/L (ref 22–32)
CO2: 25 mmol/L (ref 22–32)
Calcium: 8.4 mg/dL — ABNORMAL LOW (ref 8.9–10.3)
Calcium: 8.6 mg/dL — ABNORMAL LOW (ref 8.9–10.3)
Chloride: 102 mmol/L (ref 98–111)
Chloride: 101 mmol/L (ref 98–111)
Creatinine, Ser: 3.18 mg/dL — ABNORMAL HIGH (ref 0.44–1.00)
Creatinine, Ser: 2.12 mg/dL — ABNORMAL HIGH (ref 0.44–1.00)
GFR, Estimated: 15 mL/min — ABNORMAL LOW (ref >60)
GFR, Estimated: 25 mL/min — ABNORMAL LOW (ref >60)
Glucose, Bld: 172 mg/dL — ABNORMAL HIGH (ref 70–99)
Glucose, Bld: 214 mg/dL — ABNORMAL HIGH (ref 70–99)
Phosphorus: 5.1 mg/dL — ABNORMAL HIGH (ref 2.5–4.6)
Phosphorus: 3.7 mg/dL (ref 2.5–4.6)
Potassium: 3.7 mmol/L (ref 3.5–5.1)
Potassium: 4.9 mmol/L (ref 3.5–5.1)
Sodium: 138 mmol/L (ref 135–145)
Sodium: 137 mmol/L (ref 135–145)

## 2020-10-22 LAB — LACTIC ACID, PLASMA: Lactic Acid, Venous: 1.7 mmol/L (ref 0.5–1.9)

## 2020-10-22 LAB — CBC
HCT: 23 % — ABNORMAL LOW (ref 36.0–46.0)
Hemoglobin: 7 g/dL — ABNORMAL LOW (ref 12.0–15.0)
MCH: 28.9 pg (ref 26.0–34.0)
MCHC: 30.4 g/dL (ref 30.0–36.0)
MCV: 95 fL (ref 80.0–100.0)
Platelets: 287 10*3/uL (ref 150–400)
RBC: 2.42 MIL/uL — ABNORMAL LOW (ref 3.87–5.11)
RDW: 15.4 % (ref 11.5–15.5)
WBC: 21.9 10*3/uL — ABNORMAL HIGH (ref 4.0–10.5)
nRBC: 2.5 % — ABNORMAL HIGH (ref 0.0–0.2)

## 2020-10-22 LAB — GLUCOSE, CAPILLARY
Glucose-Capillary: 153 mg/dL — ABNORMAL HIGH (ref 70–99)
Glucose-Capillary: 194 mg/dL — ABNORMAL HIGH (ref 70–99)
Glucose-Capillary: 197 mg/dL — ABNORMAL HIGH (ref 70–99)
Glucose-Capillary: 216 mg/dL — ABNORMAL HIGH (ref 70–99)
Glucose-Capillary: 181 mg/dL — ABNORMAL HIGH (ref 70–99)
Glucose-Capillary: 189 mg/dL — ABNORMAL HIGH (ref 70–99)

## 2020-10-22 LAB — PREPARE RBC (CROSSMATCH)

## 2020-10-22 LAB — TRIGLYCERIDES: Triglycerides: 230 mg/dL — ABNORMAL HIGH (ref <150)

## 2020-10-22 MED ORDER — SODIUM CHLORIDE 0.9% IV SOLUTION: Freq: Once | INTRAVENOUS | Status: AC

## 2020-10-22 NOTE — Progress Notes (Signed)
OT Cancellation Note  Patient Details Name: Krista Strickland MRN: [id number] DOB: Sep 30, 1950   Cancelled Treatment:    Reason Eval/Treat Not Completed: Patient not medically ready.  RN requests to check on patient Monday due to medical condition.  OT to continue efforts.    Dior Stepter D Timon Geissinger 10/22/2020, 8:58 AM

## 2020-10-22 NOTE — Progress Notes (Signed)
[facility] ROUNDING NOTE   Subjective:   Interval History: 70 year old lady with stage IV chronic kidney disease diabetes mellitus type 2 hyperlipidemia hypertrophic cardiomyopathy presents to the emergency department left lower quadrant pain status postbiopsy 10/07/2020.  Was found to have 8 x 4 x 12 cm subcapsular hematoma.  Late evening 10/15/2018.  With cardiac arrest received 1 mg of epi with ACLS/CPR with ROSC achieved.  Concerns for pulmonary embolus.  Heparin initiated.  Transferred from [facility] to [facility].    Urine output minimal underwent dialysis 10/19/2020 with 1.5 L, 10/20/2021 with 2.5 L removed Status post respiratory arrest.  Requiring intubation.  Patient now on CRRT 10/21/2020  Chest x-ray left pleural effusion with increased interstitial and airspace densities   Blood pressure 138/59 pulse 64 temperature 94 O2 sats 90% FiO2 50%  Sodium 138 potassium 3.7 chloride 1062 CO2 27 BUN 62 creatinine 3.18 glucose 172 calcium 8.4 phosphorus 5.1 magnesium 2.5 hemoglobin 7    Objective:  Vital signs in last 24 hours:  Temp:  [93.8 F (34.3 C)-99.6 F (37.6 C)] 94 F (34.4 C) (09/24 0818) Pulse Rate:  [56-136] 64 (09/24 0820) Resp:  [15-28] 24 (09/24 0820) BP: (86-220)/(50-115) 138/59 (09/24 0820) SpO2:  [79 %-100 %] 100 % (09/24 0820) FiO2 (%):  [40 %-100 %] 50 % (09/24 0818) Weight:  [84.9 kg] 84.9 kg (09/24 0324)  Weight change: 0.6 kg Filed Weights   10/20/20 0144 10/21/20 0500 10/22/20 0324  Weight: 81.6 kg 84.3 kg 84.9 kg    Intake/Output: I/O last 3 completed shifts: In: 3385.3 [P.O.:800; I.V.:731.8; NG/GT:1753.5; IV Piggyback:100] Out: 4481 [Other:1172]   Intake/Output this shift:  Total I/O In: 616.1 [I.V.:120.2; Blood:455.8; NG/GT:40] Out: 168 [Other:168]  General:intubated, sedated Heart:RRR Lungs:intuabted, bl chest expansion, no crackles Abdomen:soft, Non-tender, non-distended Extremities: Bilateral ankle edema  present+ Neuro: sedated   Basic Metabolic Panel: Recent Labs  Lab 10/17/20 1650 10/18/20 0650 10/19/20 0508 10/20/20 0526 10/21/20 0528 10/21/20 1305 10/21/20 1314 10/21/20 1432 10/22/20 0206  NA  --    < > 138 138 140 140 144 139 138  K  --    < > 3.8 3.8 3.2* 3.9 4.0 4.3 3.7  CL  --    < > 98 99 100 100  --   --  102  CO2  --    < > '24 26 28 27  ' --   --  27  GLUCOSE  --    < > 238* 302* 183* 298*  --   --  172*  BUN  --    < > 99* 79* 69* 77*  --   --  62*  CREATININE  --    < > 4.30* 3.70* 3.49* 4.20*  --   --  3.18*  CALCIUM  --    < > 8.9 8.9 9.0 9.1  --   --  8.4*  MG 2.5*  --   --  2.3 2.2 2.6*  --   --  2.5*  PHOS 10.4*  --  6.7* 6.3* 5.7* 9.9*  --   --  5.1*   < > = values in this interval not displayed.     Liver Function Tests: Recent Labs  Lab 10/19/20 0508 10/20/20 0526 10/21/20 0528 10/21/20 1305 10/22/20 0206  AST  --   --   --  202*  --   ALT  --   --   --  189*  --   ALKPHOS  --   --   --  242*  --   BILITOT  --   --   --  0.8  --   PROT  --   --   --  5.8*  --   ALBUMIN 2.1* 2.0* 2.0* 2.1* 2.1*    No results for input(s): LIPASE, AMYLASE in the last 168 hours.  No results for input(s): AMMONIA in the last 168 hours.  CBC: Recent Labs  Lab 10/19/20 0508 10/20/20 0526 10/21/20 0528 10/21/20 1305 10/21/20 1314 10/21/20 1432 10/22/20 0206  WBC 20.8* 21.4* 18.7* 30.4*  --   --  21.9*  NEUTROABS  --   --   --  24.6*  --   --   --   HGB 7.7* 7.3* 7.1* 7.4* 7.5* 8.5* 7.0*  HCT 23.8* 23.0* 22.8* 25.4* 22.0* 25.0* 23.0*  MCV 88.8 91.3 93.8 98.8  --   --  95.0  PLT 301 304 265 361  --   --  287     Cardiac Enzymes: No results for input(s): CKTOTAL, CKMB, CKMBINDEX, TROPONINI in the last 168 hours.  BNP: Invalid input(s): POCBNP  CBG: Recent Labs  Lab 10/21/20 1521 10/21/20 1927 10/21/20 2336 10/22/20 0340 10/22/20 0728  GLUCAP 239* 103* 161* 153* 194*     Microbiology: Results for orders placed or performed during the  hospital encounter of 10/10/2020  Resp Panel by RT-PCR (Flu A&B, Covid) Nasopharyngeal Swab     Status: None   Collection Time: 10/24/2020  9:07 PM   Specimen: Nasopharyngeal Swab; Nasopharyngeal(NP) swabs in vial transport medium  Result Value Ref Range Status   SARS Coronavirus 2 by RT PCR NEGATIVE NEGATIVE Final    Comment: (NOTE) SARS-CoV-2 target nucleic acids are NOT DETECTED.  The SARS-CoV-2 RNA is generally detectable in upper respiratory specimens during the acute phase of infection. The lowest concentration of SARS-CoV-2 viral copies this assay can detect is 138 copies/mL. A negative result does not preclude SARS-Cov-2 infection and should not be used as the sole basis for treatment or other patient management decisions. A negative result may occur with  improper specimen collection/handling, submission of specimen other than nasopharyngeal swab, presence of viral mutation(s) within the areas targeted by this assay, and inadequate number of viral copies(<138 copies/mL). A negative result must be combined with clinical observations, patient history, and epidemiological information. The expected result is Negative.  Fact Sheet for Patients:  EntrepreneurPulse.com.au  Fact Sheet for Healthcare Providers:  IncredibleEmployment.be  This test is no t yet approved or cleared by the [place] FDA and  has been authorized for detection and/or diagnosis of SARS-CoV-2 by FDA under an Emergency Use Authorization (EUA). This EUA will remain  in effect (meaning this test can be used) for the duration of the COVID-19 declaration under Section 564(b)(1) of the Act, 21 U.S.C.section 360bbb-3(b)(1), unless the authorization is terminated  or revoked sooner.       Influenza A by PCR NEGATIVE NEGATIVE Final   Influenza B by PCR NEGATIVE NEGATIVE Final    Comment: (NOTE) The Xpert Xpress SARS-CoV-2/FLU/RSV plus assay is intended as an aid in the  diagnosis of influenza from Nasopharyngeal swab specimens and should not be used as a sole basis for treatment. Nasal washings and aspirates are unacceptable for Xpert Xpress SARS-CoV-2/FLU/RSV testing.  Fact Sheet for Patients: EntrepreneurPulse.com.au  Fact Sheet for Healthcare Providers: IncredibleEmployment.be  This test is not yet approved or cleared by the [place] FDA and has been authorized for detection and/or diagnosis of SARS-CoV-2 by FDA under an  Emergency Use Authorization (EUA). This EUA will remain in effect (meaning this test can be used) for the duration of the COVID-19 declaration under Section 564(b)(1) of the Act, 21 U.S.C. section 360bbb-3(b)(1), unless the authorization is terminated or revoked.  Performed at [facility], [address] [place], [place] [address]   MRSA Next Gen by PCR, Nasal     Status: None   Collection Time: 10/12/20  5:00 PM   Specimen: Nasal Mucosa; Nasal Swab  Result Value Ref Range Status   MRSA by PCR Next Gen NOT DETECTED NOT DETECTED Final    Comment: (NOTE) The GeneXpert MRSA Assay (FDA approved for NASAL specimens only), is one component of a comprehensive MRSA colonization surveillance program. It is not intended to diagnose MRSA infection nor to guide or monitor treatment for MRSA infections. Test performance is not FDA approved in patients less than 104 years old. Performed at [facility], [address] [place], [place] [address]   Urine Culture     Status: None   Collection Time: 10/15/20  4:51 AM   Specimen: Urine, Catheterized  Result Value Ref Range Status   Specimen Description   Final    URINE, CATHETERIZED Performed at [facility], [address], [place], [place] [address]    Special Requests   Final    NONE Performed at [facility], [address] [place], [place] [address]    Culture   Final    NO GROWTH Performed at [facility], [place] [address],  [place], [place] [address]    Report Status 10/16/2020 FINAL  Final  Culture, blood (x 2)     Status: None   Collection Time: 10/15/20  5:02 AM   Specimen: BLOOD LEFT WRIST  Result Value Ref Range Status   Specimen Description   Final    BLOOD LEFT WRIST BOTTLES DRAWN AEROBIC AND ANAEROBIC   Special Requests Blood Culture adequate volume  Final   Culture   Final    NO GROWTH 5 DAYS Performed at [facility], [address], [place], [place] [address]    Report Status 10/20/2020 FINAL  Final  Culture, blood (x 2)     Status: None   Collection Time: 10/15/20  5:13 AM   Specimen: BLOOD LEFT ARM  Result Value Ref Range Status   Specimen Description BLOOD LEFT ARM BOTTLES DRAWN AEROBIC AND ANAEROBIC  Final   Special Requests Blood Culture adequate volume  Final   Culture   Final    NO GROWTH 5 DAYS Performed at [facility], [address] [place], [place] [address]    Report Status 10/20/2020 FINAL  Final  Culture, Respiratory w Gram Stain     Status: None   Collection Time: 10/16/20  8:52 AM   Specimen: Tracheal Aspirate; Respiratory  Result Value Ref Range Status   Specimen Description TRACHEAL ASPIRATE  Final   Special Requests NONE  Final   Gram Stain   Final    RARE WBC PRESENT, PREDOMINANTLY PMN RARE GRAM POSITIVE COCCI IN PAIRS    Culture   Final    RARE Normal respiratory flora-no Staph aureus or Pseudomonas seen Performed at [facility], [address], [place], [place] [address]    Report Status 10/18/2020 FINAL  Final    Coagulation Studies: No results for input(s): LABPROT, INR in the last 72 hours.   Urinalysis: No results for input(s): COLORURINE, LABSPEC, PHURINE, GLUCOSEU, HGBUR, BILIRUBINUR, KETONESUR, PROTEINUR, UROBILINOGEN, NITRITE, LEUKOCYTESUR in the  last 72 hours.  Invalid input(s): APPERANCEUR     Imaging: DG Abd 1 View  Result Date: 10/21/2020 CLINICAL DATA:  NG tube placement EXAM: ABDOMEN - 1 VIEW COMPARISON:  10/15/2020 FINDINGS:  Esophageal tube tip and side port overlie the body of the stomach. Upper gas pattern is unremarkable IMPRESSION: Esophageal tube tip and side port overlie the stomach Electronically Signed   By: Donavan Foil M.D.   On: 10/21/2020 17:06   DG Abd 1 View  Result Date: 10/21/2020 CLINICAL DATA:  Repositioning of OG tube EXAM: ABDOMEN - 1 VIEW COMPARISON:  10/21/2020 FINDINGS: Endotracheal tube tip about 12 mm superior to carina. Partially visualized vascular catheter tip over SVC. Esophageal tube tip and side port overlie proximal stomach. Bilateral pleural effusions and airspace disease at the left base. IMPRESSION: Esophageal tube tip and side port overlie the proximal stomach. Endotracheal tube tip about 12 mm superior to carina Electronically Signed   By: Donavan Foil M.D.   On: 10/21/2020 17:06   CT HEAD WO CONTRAST (5MM)  Result Date: 10/21/2020 CLINICAL DATA:  Anoxic brain injury. EXAM: CT HEAD WITHOUT CONTRAST TECHNIQUE: Contiguous axial images were obtained from the base of the skull through the vertex without intravenous contrast. COMPARISON:  None. FINDINGS: Brain: There is mild cerebral atrophy with widening of the extra-axial spaces and ventricular dilatation. There are areas of decreased attenuation within the white matter tracts of the supratentorial brain, consistent with microvascular disease changes. Vascular: No hyperdense vessel or unexpected calcification. Skull: Normal. Negative for fracture or focal lesion. Sinuses/Orbits: No acute finding. Other: Endotracheal and orogastric tubes are in place. IMPRESSION: No acute intracranial abnormality. Electronically Signed   By: Virgina Norfolk M.D.   On: 10/21/2020 15:14   Portable Chest x-ray  Result Date: 10/22/2020 CLINICAL DATA:  Central line placement. EXAM: PORTABLE CHEST 1 VIEW COMPARISON:  Radiograph earlier today. FINDINGS: There is a new right internal jugular central venous catheter with tip projecting over the upper SVC. Stable  positioning of left internal jugular central venous catheter with tip at a similar level over the upper SVC. Endotracheal tube tip is approximately 2.8 cm from the carina. Enteric tube remains in place. Worsening lung aeration. Hazy opacity greatest at the lung bases favoring layering effusions. Previous pulmonary edema is obscured. Mild cardiomegaly. Stable calcifications projecting over the left and right lung base no pneumothorax. IMPRESSION: 1. New right internal jugular central venous catheter with tip projecting over the upper SVC. No pneumothorax. 2. Stable left internal jugular central venous catheter with tip at a similar level over the upper SVC. 3. Worsening lung aeration with increasing hazy opacity, likely layering effusions. This obscures the previously demonstrated pulmonary edema. Electronically Signed   By: Keith Rake M.D.   On: 10/22/2020 00:03   Portable Chest x-ray  Result Date: 10/21/2020 CLINICAL DATA:  Central venous catheter placement EXAM: PORTABLE CHEST 1 VIEW COMPARISON:  10/21/2020 FINDINGS: Endotracheal tube is seen 3.2 cm above the carina. Nasogastric tube extends into the left upper quadrant of the abdomen beyond the margin of the examination. Left internal jugular central venous catheter tip is unchanged with its tip overlying the expected superior vena caval confluence. The catheter overlies the aortic knob related to rotation on the current examination. Pulmonary insufflation is stable. Small to moderate bilateral pleural effusions are again identified. Retrocardiac consolidation persists in keeping with atelectasis, infiltrate, or posteriorly layering fluid in this location. No pneumothorax. Pulmonary vascularity is normal. Cardiac size is within normal limits. IMPRESSION: Stable  support lines and tubes. Stable small to moderate bilateral pleural effusions and retrocardiac consolidation. Electronically Signed   By: Fidela Salisbury M.D.   On: 10/21/2020 20:13   DG Chest  Port 1 View  Result Date: 10/21/2020 CLINICAL DATA:  ETT placement EXAM: PORTABLE CHEST 1 VIEW COMPARISON:  October 21, 2020, October 18, 2020 FINDINGS: The cardiomediastinal silhouette is unchanged in contour.ETT tip terminates approximately 4 -5 mm above the carina. LEFT IJ CVC tip terminates over the SVC. The enteric tube courses through the chest to the abdomen beyond the field-of-view. Small RIGHT pleural effusion. No pneumothorax. New platelike opacity in the LEFT mid lung, possibly fluid within the major fissure. Bibasilar opacities with LEFT retrocardiac opacity, overall improved since September twentieth, 2022. Mild interstitial prominence with indistinctness of the perihilar vasculature. Small RIGHT visualized abdomen is unremarkable. IMPRESSION: 1. ETT tip terminates approximately 4-5 mm above the carina. Recommend repositioning. 2. Likely moderate LEFT pleural effusion with fluid within a fissure. Persistent bibasilar heterogeneous opacities superimposed on a background of pulmonary edema, overall improved since October 18, 2020. Electronically Signed   By: Valentino Saxon M.D.   On: 10/21/2020 14:38   DG CHEST PORT 1 VIEW  Result Date: 10/21/2020 CLINICAL DATA:  Respiratory failure EXAM: PORTABLE CHEST 1 VIEW COMPARISON:  Prior chest x-ray dated October 18, 2020 FINDINGS: Unchanged position of ETT, left IJ line and enteric tube. Cardiac and mediastinal contours are unchanged. Bilateral heterogeneous pulmonary opacity is, similar to prior exam. Newly visible small right pleural effusion with left pleural effusion not definitely seen, likely due to differences in patient rotation. No evidence of pneumothorax. IMPRESSION: Unchanged bilateral heterogeneous opacities. Electronically Signed   By: Yetta Glassman M.D.   On: 10/21/2020 11:13     Medications:     prismasol BGK 4/2.5 500 mL/hr at 10/21/20 1733    prismasol BGK 4/2.5 500 mL/hr at 10/21/20 1733   sodium chloride Stopped  (10/22/20 0542)   sodium chloride     sodium chloride     feeding supplement (VITAL 1.5 CAL) 40 mL/hr at 10/22/20 0400   norepinephrine (LEVOPHED) Adult infusion Stopped (10/22/20 0454)   prismasol BGK 4/2.5 2,000 mL/hr at 10/22/20 0612   propofol (DIPRIVAN) infusion 40 mcg/kg/min (10/22/20 0800)    albuterol  2.5 mg Nebulization BID   calcitRIOL  0.25 mcg Per Tube Daily   chlorhexidine gluconate (MEDLINE KIT)  15 mL Mouth Rinse BID   Chlorhexidine Gluconate Cloth  6 each Topical Daily   docusate  100 mg Per Tube BID   ezetimibe  10 mg Per Tube Daily   febuxostat  40 mg Per NG tube Daily   feeding supplement (PROSource TF)  45 mL Per Tube TID   gabapentin  200 mg Per Tube Q12H   heparin injection (subcutaneous)  5,000 Units Subcutaneous Q8H   influenza vaccine adjuvanted  0.5 mL Intramuscular Tomorrow-1000   insulin aspart  0-15 Units Subcutaneous Q4H   insulin aspart  10 Units Subcutaneous Q4H   insulin glargine-yfgn  15 Units Subcutaneous Daily   insulin glargine-yfgn  30 Units Subcutaneous QHS   mouth rinse  15 mL Mouth Rinse 10 times per day   multivitamin  1 tablet Per Tube QHS   pantoprazole sodium  40 mg Per Tube Daily   polyethylene glycol  17 g Per Tube Daily   pravastatin  20 mg Per NG tube QAC breakfast   prednisoLONE acetate  1 drop Left Eye BID   sodium chloride flush  10-40  mL Intracatheter Q12H   sodium chloride, sodium chloride, albuterol, alteplase, fentaNYL (SUBLIMAZE) injection, heparin, heparin, hydrALAZINE, HYDROcodone-acetaminophen, lidocaine (PF), lidocaine-prilocaine, ondansetron (ZOFRAN) IV, pentafluoroprop-tetrafluoroeth, sodium chloride, sodium chloride flush  Assessment/ Plan:  1.Acute kidney injury on CKD stage IV: Patient has longstanding CKD with baseline creatinine level seems to be around 2.6-2.9 however recently the GFR declined and creatinine level around 3.6 as outpatient.  Reportedly the biopsy result came back hypertensive and diabetic changes  with severe interstitial fibrosis and tubular atrophy with mostly chronic changes.  -Discussed with critical care medicine.  Will initiate hemodialysis 10/19/2020 patient is clearly volume overloaded with significant azotemia.   .  Patient underwent successful dialysis 10/19/2020 and 10/20/2021.  Minimal urine output initiated CRRT 10/21/2020     2 Post biopsy left kidney hematoma: 8 x 4 x 12 cm subcapsular hematoma along the left kidney leading to mass-effect on the renal parenchyma.  IR was consulted, no plan for embolization.  Continue to monitor CBC and imaging studies. -Transfuse as necessary.   3 Cardiac arrest 9/16-asystole -transferred from [facility] post arrest -brief course of CPR, 1 round of epi     4 Hyponatremia, resolved at this point  6 Metabolic acidosis: Controlled with CRRT   7 Hypertension, now hypotensive: pressor support per primary service   8 Anemia of CKD and blood loss postbiopsy: Monitor CBC and transfuse as needed.  9.  Ventilator dependent respiratory failure.  With intubation per critical care service  LOS: [place] '@TODAY' '@8' :29 AM

## 2020-10-22 NOTE — Progress Notes (Signed)
eLink Physician-Brief Progress Note Patient Name: Krista Strickland DOB: [birth year] MRN: [id number]   Date of Service  10/22/2020  HPI/Events of Note  Multiple issues: 1. Anemia - Hgb = 7.0 down from 8.5 yesterday and 2. Hypothermia - Temp = 94.2 F axillary.  eICU Interventions  Plan: Transfuse 1 unit PRBC now. Bair Hugger PRN.      Intervention Category Major Interventions: Other:  Lysle Dingwall 10/22/2020, 3:54 AM

## 2020-10-22 NOTE — Progress Notes (Signed)
NAME:  Krista Strickland, MRN:  [id number], DOB:  [birth year], LOS: 78 ADMISSION DATE:  10/22/2020, CONSULTATION DATE: 10/15/2020 REFERRING MD: Dr. Roger Shelter, CHIEF COMPLAINT: Cardiac arrest  History of Present Illness:  Krista Strickland is a 70 y.o. female with a past medical history significant for CKD stage V, hypertension, type 2 diabetes, GERD, hyperlipidemia, and hypertrophic cardiomyopathy who presented to the emergency department) with left lower quadrant abdominal pain that began day of admission.  Of note patient had renal biopsy 10/07/2020.  On ED arrival patient was seen hypertensive with all other vital signs within normal limits.  Due to abdominal pain complaints with recent liver biopsy CT abdomen pelvis was obtained and revealed 8 x 4 x 12 cm left present subscapular hematoma for which intervention radiology was consulted and recommended monitoring but if symptoms worsen and arteriogram could be considered.  Late evening of 9/16 patient suffered cardiac arrest.  After returning from the restroom patient had episode dyspnea followed by bradycardia and later asystole.  She received 1 mg of epi with ACLS/CPR with ROSC achieved and was seen with spontaneous respirations but significantly diminished mentation resulting in decision to endotracheally intubate.  Given sudden onset of dyspnea post ambulation with resultant bradycardia concern for PE as cause of cardiac arrest was very high given renal function decision was made to forego CTA and began therapeutic heparin.  Heparin drip was later discontinued due to downtrending of hemoglobin.  PCCM was consulted for further management and decision was made to transfer patient to [facility] for high-level care.  She remains critically ill  Significant Hospital Events: Including procedures, antibiotic start and stop dates in addition to other pertinent events   9/13 admitted to [facility] with abdominal pain found to have a subcapsular left renal  hematoma post biopsy 9/16 cardiac arrest likely due to aspiration 9/18 passed SBT but copious secretion 9/20 non-tunneled dialysis catheter placed and HD initiated 9/23 patient extubated, developed respiratory distress and cardiac arrest. Patient was re-intubated. Right IJ Vas-cath placed. Left IJ vas-cath removed.  Interim History / Subjective:   Patient has been weaned off levophed since her cardiac arrest event yesterday. She was started on CRRT overnight. New right IJ vas-cath placed due to issues with left IJ vas-cath.  Patient is following commands. Head CT yesterday is negative for acute pathology.  Objective   Blood pressure (!) 135/58, pulse 66, temperature (!) 94 F (34.4 C), temperature source Axillary, resp. rate (!) 24, height (P) 5\' 4"  (1.626 m), weight 84.9 kg, SpO2 100 %.    Vent Mode: PRVC FiO2 (%):  [40 %-100 %] 40 % Set Rate:  [15 bmp-24 bmp] 24 bmp Vt Set:  [440 mL] 440 mL PEEP:  [7 cmH20-8 cmH20] 8 cmH20 Pressure Support:  [5 cmH20] 5 cmH20 Plateau Pressure:  [16 cmH20-23 cmH20] 18 cmH20   Intake/Output Summary (Last 24 hours) at 10/22/2020 0836 Last data filed at 10/22/2020 0818 Gross per 24 hour  Intake 2993.55 ml  Output 1340 ml  Net 1653.55 ml   Filed Weights   10/20/20 0144 10/21/20 0500 10/22/20 0324  Weight: 81.6 kg 84.3 kg 84.9 kg    Examination: General:  elderly woman, no acute distress, intubated/sedated HEENT: MM pink/moist, anicteric, atraumatic, left eye cataract Neuro: opens eyes to verbal stimuli, follows commands CV: rrr, no murmurs PULM:  course breath sounds GI: soft, bsx4 active/hypoactive   Extremities: warm/dry, no edema, capillary refill less than 3 seconds  Skin: no rashes or lesions  Resolved  Hospital Problem list   Demand cardiac ischemia Chronic hyponatremia Septic shock due to aspiration pneumonia  Assessment & Plan:  S/p PEA cardiac arrest due to hypoxia/hypercapnia Acute respiratory failure with hypoxia -LTVV  strategy with tidal volumes of 4-8 cc/kg ideal body weight -Goal plateau pressures less than 30 and driving pressures less than 15 -Wean PEEP/FiO2 for SpO2 92-98% -VAP bundle -Daily SAT and SBT -PAD bundle with propofol -RASS goal 0 to -1 -Follow intermittent CXR and ABG PRN  Shock In setting of cardiac arrest on 9/23 due to respiratory failure - Improved as she has been weaned off levophed overnight - Will repeat limited echo tomorrow  AKI CKD stage V now CKD stage Vd High anion gap metabolic acidosis due to kidney disease Underwent HD on 9/21 and 9/22. Started on CRRT 9/23 -Neph following -Continue CRRT for volume removal, pulling at -17mL/hr  Leukocytosis Suspect reactive from hematoma and recent cardiac arrest. Unasyn for asp pneumonia completed 9/22.  -Follow fever/wbc curve   Type 2 diabetes On TF. BG 160-288 -Blood Glucose goal 140-180. -SSI and 10U TF coverage -Semglee 30 Units qhs and added 15 U in AM  Perinephric hematoma post renal biopsy - Continue to monitor hemoglobin  Anemia of CKD  acute blood loss anemia, normocytic -Transfuse PRBC if HBG less than 7 -Obtain AM CBC to trend H&H -Monitor for signs of bleeding - Received 1 unit PRBCs overnight  Hypoalbuminemia with mild protein calorie malnutrition -continue TF  HX HOCM HX Hypertension HX Hyperlipidemia -Continue zetia, statin -Continue dilt, metop, and hydralazine  -Holding on further GDMT due to critical illness.  Best Practice    Diet/type: tubefeeds DVT prophylaxis: SCD and subq heparin 5000 units q8hours GI prophylaxis: PPI Lines: Central line Foley:  Yes, and it is no longer needed Code Status:  full code Last date of multidisciplinary goals of care discussion: pending  Critical Care time: 40 minutes   Krista Jackson, MD [place] Office: [phone]   See Amion for personal pager PCCM on call pager [phone] until 7pm. Please call Elink 7p-7a.  [phone]

## 2020-10-22 NOTE — Progress Notes (Signed)
PT Cancellation Note  Patient Details Name: Krista Strickland MRN: [id number] DOB: 04-11-1950   Cancelled Treatment:    Reason Eval/Treat Not Completed: Medical issues which prohibited therapy (Coded on 05/01/22 pm after extubation and pt re-intubated and on CRRT as well. Nurse asked PT to return Monday.)   Alvira Philips 10/22/2020, 9:49 AM Yaret Hush M,PT Acute Rehab Services [phone] (pager)

## 2020-10-23 ENCOUNTER — Inpatient Hospital Stay (HOSPITAL_COMMUNITY): Payer: HMO

## 2020-10-23 DIAGNOSIS — J9601 Acute respiratory failure with hypoxia: Secondary | ICD-10-CM | POA: Diagnosis not present

## 2020-10-23 LAB — RENAL FUNCTION PANEL
Albumin: 2.3 g/dL — ABNORMAL LOW (ref 3.5–5.0)
Albumin: 2.3 g/dL — ABNORMAL LOW (ref 3.5–5.0)
Anion gap: 9 (ref 5–15)
Anion gap: 6 (ref 5–15)
BUN: 27 mg/dL — ABNORMAL HIGH (ref 8–23)
BUN: 24 mg/dL — ABNORMAL HIGH (ref 8–23)
CO2: 27 mmol/L (ref 22–32)
CO2: 27 mmol/L (ref 22–32)
Calcium: 8.7 mg/dL — ABNORMAL LOW (ref 8.9–10.3)
Calcium: 8.4 mg/dL — ABNORMAL LOW (ref 8.9–10.3)
Chloride: 101 mmol/L (ref 98–111)
Chloride: 102 mmol/L (ref 98–111)
Creatinine, Ser: 1.62 mg/dL — ABNORMAL HIGH (ref 0.44–1.00)
Creatinine, Ser: 1.4 mg/dL — ABNORMAL HIGH (ref 0.44–1.00)
GFR, Estimated: 34 mL/min — ABNORMAL LOW (ref >60)
GFR, Estimated: 40 mL/min — ABNORMAL LOW (ref >60)
Glucose, Bld: 218 mg/dL — ABNORMAL HIGH (ref 70–99)
Glucose, Bld: 220 mg/dL — ABNORMAL HIGH (ref 70–99)
Phosphorus: 3.3 mg/dL (ref 2.5–4.6)
Phosphorus: 2.4 mg/dL — ABNORMAL LOW (ref 2.5–4.6)
Potassium: 4.7 mmol/L (ref 3.5–5.1)
Potassium: 5.2 mmol/L — ABNORMAL HIGH (ref 3.5–5.1)
Sodium: 137 mmol/L (ref 135–145)
Sodium: 135 mmol/L (ref 135–145)

## 2020-10-23 LAB — CBC
HCT: 30.1 % — ABNORMAL LOW (ref 36.0–46.0)
Hemoglobin: 8.9 g/dL — ABNORMAL LOW (ref 12.0–15.0)
MCH: 27.6 pg (ref 26.0–34.0)
MCHC: 29.6 g/dL — ABNORMAL LOW (ref 30.0–36.0)
MCV: 93.2 fL (ref 80.0–100.0)
Platelets: 255 10*3/uL (ref 150–400)
RBC: 3.23 MIL/uL — ABNORMAL LOW (ref 3.87–5.11)
RDW: 20.2 % — ABNORMAL HIGH (ref 11.5–15.5)
WBC: 20.1 10*3/uL — ABNORMAL HIGH (ref 4.0–10.5)
nRBC: 4 % — ABNORMAL HIGH (ref 0.0–0.2)

## 2020-10-23 LAB — BPAM RBC
Blood Product Expiration Date: 202210122359
ISSUE DATE / TIME: 202209240533
Unit Type and Rh: 6200

## 2020-10-23 LAB — POCT I-STAT 7, (LYTES, BLD GAS, ICA,H+H)
Acid-Base Excess: 3 mmol/L — ABNORMAL HIGH (ref 0.0–2.0)
Bicarbonate: 27.1 mmol/L (ref 20.0–28.0)
Calcium, Ion: 1.13 mmol/L — ABNORMAL LOW (ref 1.15–1.40)
HCT: 28 % — ABNORMAL LOW (ref 36.0–46.0)
Hemoglobin: 9.5 g/dL — ABNORMAL LOW (ref 12.0–15.0)
O2 Saturation: 99 %
Patient temperature: 99.1
Potassium: 5.4 mmol/L — ABNORMAL HIGH (ref 3.5–5.1)
Sodium: 135 mmol/L (ref 135–145)
TCO2: 28 mmol/L (ref 22–32)
pCO2 arterial: 40.1 mmHg (ref 32.0–48.0)
pH, Arterial: 7.438 (ref 7.350–7.450)
pO2, Arterial: 146 mmHg — ABNORMAL HIGH (ref 83.0–108.0)

## 2020-10-23 LAB — TYPE AND SCREEN
ABO/RH(D): A POS
Antibody Screen: NEGATIVE
Unit division: 0

## 2020-10-23 LAB — GLUCOSE, CAPILLARY
Glucose-Capillary: 202 mg/dL — ABNORMAL HIGH (ref 70–99)
Glucose-Capillary: 196 mg/dL — ABNORMAL HIGH (ref 70–99)
Glucose-Capillary: 198 mg/dL — ABNORMAL HIGH (ref 70–99)
Glucose-Capillary: 194 mg/dL — ABNORMAL HIGH (ref 70–99)
Glucose-Capillary: 198 mg/dL — ABNORMAL HIGH (ref 70–99)
Glucose-Capillary: 206 mg/dL — ABNORMAL HIGH (ref 70–99)

## 2020-10-23 LAB — TRIGLYCERIDES: Triglycerides: 280 mg/dL — ABNORMAL HIGH (ref <150)

## 2020-10-23 MED ORDER — NOREPINEPHRINE 4 MG/250ML-% IV SOLN
INTRAVENOUS | Status: AC
  Administered 2020-10-23: 10 ug/min via INTRAVENOUS
  Filled 2020-10-23: qty 250

## 2020-10-23 MED ORDER — HEPARIN SODIUM (PORCINE) 1000 UNIT/ML DIALYSIS
1000.0000 [IU] | INTRAMUSCULAR | Status: DC | PRN
Start: 2020-10-23 — End: 2020-10-27
  Administered 2020-10-24: 2400 [IU] via INTRAVENOUS_CENTRAL
  Administered 2020-10-27: 3000 [IU] via INTRAVENOUS_CENTRAL
  Filled 2020-10-23: qty 6
  Filled 2020-10-23: qty 3
  Filled 2020-10-23: qty 6

## 2020-10-23 MED ORDER — NOREPINEPHRINE 4 MG/250ML-% IV SOLN: 0.0000 ug/min | INTRAVENOUS | Status: DC

## 2020-10-23 MED ORDER — HEPARIN SODIUM (PORCINE) 5000 UNIT/ML IJ SOLN
500.0000 [IU]/h | INTRAMUSCULAR | Status: DC
  Administered 2020-10-23 – 2020-10-27 (×7): 500 [IU]/h via INTRAVENOUS_CENTRAL
  Filled 2020-10-23 (×7): qty 2

## 2020-10-23 MED ORDER — CHLORHEXIDINE GLUCONATE CLOTH 2 % EX PADS
6.0000 | MEDICATED_PAD | Freq: Every day | CUTANEOUS | Status: DC
  Administered 2020-10-23: 6 via TOPICAL

## 2020-10-23 MED ORDER — INSULIN GLARGINE-YFGN 100 UNIT/ML ~~LOC~~ SOLN
20.0000 [IU] | Freq: Every day | SUBCUTANEOUS | Status: DC
  Administered 2020-10-23: 20 [IU] via SUBCUTANEOUS
  Filled 2020-10-23 (×2): qty 0.2

## 2020-10-23 NOTE — Progress Notes (Signed)
BP's dropped into 60's this evening, I/O sig negative, will stop pulling fluid/ keep even for the rest of the night on CRRT. Will reassess in am.   Kelly Splinter, MD 10/23/2020, 8:47 PM

## 2020-10-23 NOTE — Progress Notes (Signed)
NAME:  Krista Strickland, MRN:  [id number], DOB:  07-Feb-1950, LOS: 62 ADMISSION DATE:  10/18/2020, CONSULTATION DATE: 10/15/2020 REFERRING MD: Dr. Roger Shelter, CHIEF COMPLAINT: Cardiac arrest  History of Present Illness:  Krista Strickland is a 70 y.o. female with a past medical history significant for CKD stage V, hypertension, type 2 diabetes, GERD, hyperlipidemia, and hypertrophic cardiomyopathy who presented to the emergency department) with left lower quadrant abdominal pain that began day of admission.  Of note patient had renal biopsy 10/07/2020.  On ED arrival patient was seen hypertensive with all other vital signs within normal limits.  Due to abdominal pain complaints with recent liver biopsy CT abdomen pelvis was obtained and revealed 8 x 4 x 12 cm left present subscapular hematoma for which intervention radiology was consulted and recommended monitoring but if symptoms worsen and arteriogram could be considered.  Late evening of 9/16 patient suffered cardiac arrest.  After returning from the restroom patient had episode dyspnea followed by bradycardia and later asystole.  She received 1 mg of epi with ACLS/CPR with ROSC achieved and was seen with spontaneous respirations but significantly diminished mentation resulting in decision to endotracheally intubate.  Given sudden onset of dyspnea post ambulation with resultant bradycardia concern for PE as cause of cardiac arrest was very high given renal function decision was made to forego CTA and began therapeutic heparin.  Heparin drip was later discontinued due to downtrending of hemoglobin.  PCCM was consulted for further management and decision was made to transfer patient to [facility] for high-level care.  She remains critically ill  Significant Hospital Events: Including procedures, antibiotic start and stop dates in addition to other pertinent events   9/13 admitted to [facility] with abdominal pain found to have a subcapsular left renal  hematoma post biopsy 9/16 cardiac arrest likely due to aspiration 9/18 passed SBT but copious secretion 9/20 non-tunneled dialysis catheter placed and HD initiated 9/23 patient extubated, developed respiratory distress and cardiac arrest. Patient was re-intubated. Right IJ Vas-cath placed. Left IJ vas-cath removed.  Interim History / Subjective:   Patient remains on CRRT. She is net negative 3.2L in past 24 hours. Chest radiograph this morning shows improved aeration bilaterally with improvement of the diffuse bilateral opacities. There is left pleural effusion.  No Acute events overnight.  Objective   Blood pressure (!) 99/51, pulse 87, temperature 97.8 F (36.6 C), temperature source Axillary, resp. rate (!) 21, height (P) 5\' 4"  (1.626 m), weight 81.3 kg, SpO2 100 %.    Vent Mode: PRVC FiO2 (%):  [40 %] 40 % Set Rate:  [24 bmp] 24 bmp Vt Set:  [440 mL] 440 mL PEEP:  [8 cmH20] 8 cmH20   Intake/Output Summary (Last 24 hours) at 10/23/2020 1008 Last data filed at 10/23/2020 0900 Gross per 24 hour  Intake 1662.76 ml  Output 4954 ml  Net -3291.24 ml   Filed Weights   10/21/20 0500 10/22/20 0324 10/23/20 0250  Weight: 84.3 kg 84.9 kg 81.3 kg    Examination: General:  elderly woman, no acute distress, intubated/sedated HEENT: MM pink/moist, anicteric, atraumatic, left eye cataract Neuro: opens eyes to verbal stimuli, follows commands CV: rrr, no murmurs PULM:  course breath sounds GI: soft, bsx4 active/hypoactive   Extremities: warm/dry, no edema, capillary refill less than 3 seconds  Skin: no rashes or lesions  Resolved Hospital Problem list   Demand cardiac ischemia Chronic hyponatremia Septic shock due to aspiration pneumonia  Assessment & Plan:  S/p PEA cardiac  arrest due to hypoxia/hypercapnia Acute respiratory failure with hypoxia -LTVV strategy with tidal volumes of 4-8 cc/kg ideal body weight -Goal plateau pressures less than 30 and driving pressures less than  15 -Wean PEEP/FiO2 for SpO2 92-98% -VAP bundle -PAD bundle with propofol -RASS goal 0 to -1 -Follow intermittent CXR and ABG PRN - Recommend SBT on 0/0 prior to extubation given her previous extubation attempt on 9/23  Shock-resolved In setting of cardiac arrest on 9/23 due to respiratory failure - Improved as she has been weaned off levophed 9/24 - Will repeat limited echo today  AKI CKD stage V now CKD stage Vd High anion gap metabolic acidosis due to kidney disease Underwent HD on 9/21 and 9/22. Started on CRRT 9/23 -Neph following -Continue CRRT for volume removal, pulling at -163mL/hr  Leukocytosis Suspect reactive from hematoma and recent cardiac arrest. Unasyn for asp pneumonia completed 9/22.  -Follow fever/wbc curve   Type 2 diabetes On TF. BG 160-288 -Blood Glucose goal 140-180. -SSI and 10U TF coverage -Semglee 30 Units qhs and 20 U in AM  Perinephric hematoma post renal biopsy - Continue to monitor hemoglobin  Anemia of CKD  acute blood loss anemia, normocytic -Transfuse PRBC if HBG less than 7 -Obtain AM CBC to trend H&H -Monitor for signs of bleeding - Received 1 unit PRBCs overnight  Hypoalbuminemia with mild protein calorie malnutrition -continue TF  HX HOCM HX Hypertension HX Hyperlipidemia -Continue zetia, statin -Holding dilt, metop, and hydralazine   Best Practice    Diet/type: tubefeeds DVT prophylaxis: SCD and subq heparin 5000 units q8hours GI prophylaxis: PPI Lines: Central line Foley:  N/A Code Status:  full code Last date of multidisciplinary goals of care discussion: pending  Critical Care time: 35 minutes   Freda Jackson, MD [place] Office: [phone]   See Amion for personal pager PCCM on call pager [phone] until 7pm. Please call Elink 7p-7a. [phone]

## 2020-10-23 NOTE — Progress Notes (Signed)
[facility] ROUNDING NOTE   Subjective:   Interval History: 70 year old lady with stage IV chronic kidney disease diabetes mellitus type 2 hyperlipidemia hypertrophic cardiomyopathy presents to the emergency department left lower quadrant pain status postbiopsy 10/07/2020.  Was found to have 8 x 4 x 12 cm subcapsular hematoma.  Late evening 10/15/2018.  With cardiac arrest received 1 mg of epi with ACLS/CPR with ROSC achieved.  Concerns for pulmonary embolus.  Heparin initiated.  Transferred from [facility] to [facility].    Urine output minimal underwent dialysis 10/19/2020 with 1.5 L, 10/20/2021 with 2.5 L removed Status post respiratory arrest.  Requiring intubation.  Patient now on CRRT 10/21/2020  Chest x-ray left pleural effusion with increased interstitial and airspace densities   Blood pressure 104/55 pulse 91 temperature 97.8 O2 sats 90% FiO2 40%  Sodium 137 potassium  4.7 CO2 27 phosphorus 3.3 hemoglobin 8.9    Objective:  Vital signs in last 24 hours:  Temp:  [97.4 F (36.3 C)-99.1 F (37.3 C)] 97.8 F (36.6 C) (09/25 0716) Pulse Rate:  [69-98] 87 (09/25 0800) Resp:  [16-33] 21 (09/25 0800) BP: (64-167)/(48-105) 99/51 (09/25 0800) SpO2:  [97 %-100 %] 100 % (09/25 0850) FiO2 (%):  [40 %] 40 % (09/25 0850) Weight:  [81.3 kg] 81.3 kg (09/25 0250)  Weight change: -3.6 kg Filed Weights   10/21/20 0500 10/22/20 0324 10/23/20 0250  Weight: 84.3 kg 84.9 kg 81.3 kg    Intake/Output: I/O last 3 completed shifts: In: 3034.2 [I.V.:904.8; Blood:455.8; NG/GT:1673.5] Out: 6659 [DJTTS:1779]   Intake/Output this shift:  Total I/O In: 135.1 [I.V.:35.1; NG/GT:100] Out: 358 [Other:358]  General:intubated, sedated Heart:RRR Lungs:intuabted, bl chest expansion, no crackles Abdomen:soft, Non-tender, non-distended Extremities: Bilateral ankle edema present+ Neuro: sedated   Basic Metabolic Panel: Recent Labs  Lab 10/17/20 1650 10/18/20 0650  10/20/20 0526 10/21/20 0528 10/21/20 1305 10/21/20 1314 10/21/20 1432 10/22/20 0206 10/22/20 1604 10/23/20 0336  NA  --    < > 138 140 140 144 139 138 137 137  K  --    < > 3.8 3.2* 3.9 4.0 4.3 3.7 4.9 4.7  CL  --    < > 99 100 100  --   --  102 101 101  CO2  --    < > _0 --   --  _1 GLUCOSE  --    < > 302* 183* 298*  --   --  172* 214* 218*  BUN  --    < > 79* 69* 77*  --   --  62* 38* 27*  CREATININE  --    < > 3.70* 3.49* 4.20*  --   --  3.18* 2.12* 1.62*  CALCIUM  --    < > 8.9 9.0 9.1  --   --  8.4* 8.6* 8.7*  MG 2.5*  --  2.3 2.2 2.6*  --   --  2.5*  --   --   PHOS 10.4*   < > 6.3* 5.7* 9.9*  --   --  5.1* 3.7 3.3   < > = values in this interval not displayed.     Liver Function Tests: Recent Labs  Lab 10/21/20 0528 10/21/20 1305 10/22/20 0206 10/22/20 1604 10/23/20 0336  AST  --  202*  --   --   --   ALT  --  189*  --   --   --   ALKPHOS  --  242*  --   --   --  BILITOT  --  0.8  --   --   --   PROT  --  5.8*  --   --   --   ALBUMIN 2.0* 2.1* 2.1* 2.2* 2.3*    No results for input(s): LIPASE, AMYLASE in the last 168 hours.  No results for input(s): AMMONIA in the last 168 hours.  CBC: Recent Labs  Lab 10/20/20 0526 10/21/20 0528 10/21/20 1305 10/21/20 1314 10/21/20 1432 10/22/20 0206 10/23/20 0336  WBC 21.4* 18.7* 30.4*  --   --  21.9* 20.1*  NEUTROABS  --   --  24.6*  --   --   --   --   HGB 7.3* 7.1* 7.4* 7.5* 8.5* 7.0* 8.9*  HCT 23.0* 22.8* 25.4* 22.0* 25.0* 23.0* 30.1*  MCV 91.3 93.8 98.8  --   --  95.0 93.2  PLT 304 265 361  --   --  287 255     Cardiac Enzymes: No results for input(s): CKTOTAL, CKMB, CKMBINDEX, TROPONINI in the last 168 hours.  BNP: Invalid input(s): POCBNP  CBG: Recent Labs  Lab 10/22/20 1522 10/22/20 1922 10/22/20 2303 10/23/20 0313 10/23/20 0713  GLUCAP 216* 181* 189* 202* 196*     Microbiology: Results for orders placed or performed during the hospital encounter of 10/20/2020  Resp Panel  by RT-PCR (Flu A&B, Covid) Nasopharyngeal Swab     Status: None   Collection Time: 10/28/2020  9:07 PM   Specimen: Nasopharyngeal Swab; Nasopharyngeal(NP) swabs in vial transport medium  Result Value Ref Range Status   SARS Coronavirus 2 by RT PCR NEGATIVE NEGATIVE Final    Comment: (NOTE) SARS-CoV-2 target nucleic acids are NOT DETECTED.  The SARS-CoV-2 RNA is generally detectable in upper respiratory specimens during the acute phase of infection. The lowest concentration of SARS-CoV-2 viral copies this assay can detect is 138 copies/mL. A negative result does not preclude SARS-Cov-2 infection and should not be used as the sole basis for treatment or other patient management decisions. A negative result may occur with  improper specimen collection/handling, submission of specimen other than nasopharyngeal swab, presence of viral mutation(s) within the areas targeted by this assay, and inadequate number of viral copies(<138 copies/mL). A negative result must be combined with clinical observations, patient history, and epidemiological information. The expected result is Negative.  Fact Sheet for Patients:  EntrepreneurPulse.com.au  Fact Sheet for Healthcare Providers:  IncredibleEmployment.be  This test is no t yet approved or cleared by the [place] FDA and  has been authorized for detection and/or diagnosis of SARS-CoV-2 by FDA under an Emergency Use Authorization (EUA). This EUA will remain  in effect (meaning this test can be used) for the duration of the COVID-19 declaration under Section 564(b)(1) of the Act, 21 U.S.C.section 360bbb-3(b)(1), unless the authorization is terminated  or revoked sooner.       Influenza A by PCR NEGATIVE NEGATIVE Final   Influenza B by PCR NEGATIVE NEGATIVE Final    Comment: (NOTE) The Xpert Xpress SARS-CoV-2/FLU/RSV plus assay is intended as an aid in the diagnosis of influenza from Nasopharyngeal swab  specimens and should not be used as a sole basis for treatment. Nasal washings and aspirates are unacceptable for Xpert Xpress SARS-CoV-2/FLU/RSV testing.  Fact Sheet for Patients: EntrepreneurPulse.com.au  Fact Sheet for Healthcare Providers: IncredibleEmployment.be  This test is not yet approved or cleared by the [place] FDA and has been authorized for detection and/or diagnosis of SARS-CoV-2 by FDA under an Emergency Use Authorization (EUA). This  EUA will remain in effect (meaning this test can be used) for the duration of the COVID-19 declaration under Section 564(b)(1) of the Act, 21 U.S.C. section 360bbb-3(b)(1), unless the authorization is terminated or revoked.  Performed at [facility], [address], [place], [place] [address]   MRSA Next Gen by PCR, Nasal     Status: None   Collection Time: 10/12/20  5:00 PM   Specimen: Nasal Mucosa; Nasal Swab  Result Value Ref Range Status   MRSA by PCR Next Gen NOT DETECTED NOT DETECTED Final    Comment: (NOTE) The GeneXpert MRSA Assay (FDA approved for NASAL specimens only), is one component of a comprehensive MRSA colonization surveillance program. It is not intended to diagnose MRSA infection nor to guide or monitor treatment for MRSA infections. Test performance is not FDA approved in patients less than 72 years old. Performed at [facility], [address] [place], [place] [address]   Urine Culture     Status: None   Collection Time: 10/15/20  4:51 AM   Specimen: Urine, Catheterized  Result Value Ref Range Status   Specimen Description   Final    URINE, CATHETERIZED Performed at [facility], [address], [place], [place] [address]    Special Requests   Final    NONE Performed at [facility], [address], [place], [place] [address]    Culture   Final    NO GROWTH Performed at [facility], [place] [address], [place], [place] [address]    Report Status 10/16/2020  FINAL  Final  Culture, blood (x 2)     Status: None   Collection Time: 10/15/20  5:02 AM   Specimen: BLOOD LEFT WRIST  Result Value Ref Range Status   Specimen Description   Final    BLOOD LEFT WRIST BOTTLES DRAWN AEROBIC AND ANAEROBIC   Special Requests Blood Culture adequate volume  Final   Culture   Final    NO GROWTH 5 DAYS Performed at [facility], [address], [place], [place] [address]    Report Status 10/20/2020 FINAL  Final  Culture, blood (x 2)     Status: None   Collection Time: 10/15/20  5:13 AM   Specimen: BLOOD LEFT ARM  Result Value Ref Range Status   Specimen Description BLOOD LEFT ARM BOTTLES DRAWN AEROBIC AND ANAEROBIC  Final   Special Requests Blood Culture adequate volume  Final   Culture   Final    NO GROWTH 5 DAYS Performed at [facility], [address], [place], [place] [address]    Report Status 10/20/2020 FINAL  Final  Culture, Respiratory w Gram Stain     Status: None   Collection Time: 10/16/20  8:52 AM   Specimen: Tracheal Aspirate; Respiratory  Result Value Ref Range Status   Specimen Description TRACHEAL ASPIRATE  Final   Special Requests NONE  Final   Gram Stain   Final    RARE WBC PRESENT, PREDOMINANTLY PMN RARE GRAM POSITIVE COCCI IN PAIRS    Culture   Final    RARE Normal respiratory flora-no Staph aureus or Pseudomonas seen Performed at [facility], [address] [place],  [address]    Report Status 10/18/2020 FINAL  Final    Coagulation Studies: No results for input(s): LABPROT, INR in the last 72 hours.   Urinalysis: No results for input(s): COLORURINE, LABSPEC, PHURINE, GLUCOSEU, HGBUR, BILIRUBINUR, KETONESUR, PROTEINUR, UROBILINOGEN, NITRITE, LEUKOCYTESUR in the last 72 hours.  Invalid  input(s): APPERANCEUR     Imaging: DG Abd 1 View  Result Date: 10/21/2020 CLINICAL DATA:  NG tube placement EXAM: ABDOMEN - 1 VIEW COMPARISON:  10/15/2020 FINDINGS: Esophageal tube tip and side port overlie the body of the  stomach. Upper gas pattern is unremarkable IMPRESSION: Esophageal tube tip and side port overlie the stomach Electronically Signed   By: Donavan Foil M.D.   On: 10/21/2020 17:06   DG Abd 1 View  Result Date: 10/21/2020 CLINICAL DATA:  Repositioning of OG tube EXAM: ABDOMEN - 1 VIEW COMPARISON:  10/21/2020 FINDINGS: Endotracheal tube tip about 12 mm superior to carina. Partially visualized vascular catheter tip over SVC. Esophageal tube tip and side port overlie proximal stomach. Bilateral pleural effusions and airspace disease at the left base. IMPRESSION: Esophageal tube tip and side port overlie the proximal stomach. Endotracheal tube tip about 12 mm superior to carina Electronically Signed   By: Donavan Foil M.D.   On: 10/21/2020 17:06   CT HEAD WO CONTRAST (5MM)  Result Date: 10/21/2020 CLINICAL DATA:  Anoxic brain injury. EXAM: CT HEAD WITHOUT CONTRAST TECHNIQUE: Contiguous axial images were obtained from the base of the skull through the vertex without intravenous contrast. COMPARISON:  None. FINDINGS: Brain: There is mild cerebral atrophy with widening of the extra-axial spaces and ventricular dilatation. There are areas of decreased attenuation within the white matter tracts of the supratentorial brain, consistent with microvascular disease changes. Vascular: No hyperdense vessel or unexpected calcification. Skull: Normal. Negative for fracture or focal lesion. Sinuses/Orbits: No acute finding. Other: Endotracheal and orogastric tubes are in place. IMPRESSION: No acute intracranial abnormality. Electronically Signed   By: Virgina Norfolk M.D.   On: 10/21/2020 15:14   DG CHEST PORT 1 VIEW  Result Date: 10/23/2020 CLINICAL DATA:  Respiratory failure.  Follow-up study. EXAM: PORTABLE CHEST 1 VIEW COMPARISON:  10/21/2020 and older exams. FINDINGS: Cardiac silhouette normal in size. Left lung base opacity obscures the left hemidiaphragm. There is opacity blunting the right lateral costophrenic  sulcus. Findings consistent with bilateral pleural effusions. Additional left lung base opacity is consistent with atelectasis or pneumonia. Hazy opacity noted throughout most of the right hemithorax on the prior study has improved. Small dense nodule at the right lung base consistent with a healed granuloma. Remainder of the lungs is now clear. No pneumothorax. Endotracheal tube, right internal jugular dual lumen central venous catheter and nasal/orogastric tube are stable and well positioned. IMPRESSION: 1. Improved lung aeration compared to the prior exam. Hazy opacity noted throughout most of the right hemithorax on the prior study has mostly resolved. 2. Persistent left greater than right pleural effusions. Persistent left lung base airspace opacity consistent with atelectasis or pneumonia. 3. No evidence of pulmonary edema. 4. Support apparatus is stable and well positioned. Electronically Signed   By: Lajean Manes M.D.   On: 10/23/2020 09:48   Portable Chest x-ray  Result Date: 10/22/2020 CLINICAL DATA:  Central line placement. EXAM: PORTABLE CHEST 1 VIEW COMPARISON:  Radiograph earlier today. FINDINGS: There is a new right internal jugular central venous catheter with tip projecting over the upper SVC. Stable positioning of left internal jugular central venous catheter with tip at a similar level over the upper SVC. Endotracheal tube tip is approximately 2.8 cm from the carina. Enteric tube remains in place. Worsening lung aeration. Hazy opacity greatest at the lung bases favoring layering effusions. Previous pulmonary edema is obscured. Mild cardiomegaly. Stable calcifications projecting over the left and right lung base no pneumothorax.  IMPRESSION: 1. New right internal jugular central venous catheter with tip projecting over the upper SVC. No pneumothorax. 2. Stable left internal jugular central venous catheter with tip at a similar level over the upper SVC. 3. Worsening lung aeration with increasing  hazy opacity, likely layering effusions. This obscures the previously demonstrated pulmonary edema. Electronically Signed   By: Keith Rake M.D.   On: 10/22/2020 00:03   Portable Chest x-ray  Result Date: 10/21/2020 CLINICAL DATA:  Central venous catheter placement EXAM: PORTABLE CHEST 1 VIEW COMPARISON:  10/21/2020 FINDINGS: Endotracheal tube is seen 3.2 cm above the carina. Nasogastric tube extends into the left upper quadrant of the abdomen beyond the margin of the examination. Left internal jugular central venous catheter tip is unchanged with its tip overlying the expected superior vena caval confluence. The catheter overlies the aortic knob related to rotation on the current examination. Pulmonary insufflation is stable. Small to moderate bilateral pleural effusions are again identified. Retrocardiac consolidation persists in keeping with atelectasis, infiltrate, or posteriorly layering fluid in this location. No pneumothorax. Pulmonary vascularity is normal. Cardiac size is within normal limits. IMPRESSION: Stable support lines and tubes. Stable small to moderate bilateral pleural effusions and retrocardiac consolidation. Electronically Signed   By: Fidela Salisbury M.D.   On: 10/21/2020 20:13   DG Chest Port 1 View  Result Date: 10/21/2020 CLINICAL DATA:  ETT placement EXAM: PORTABLE CHEST 1 VIEW COMPARISON:  October 21, 2020, October 18, 2020 FINDINGS: The cardiomediastinal silhouette is unchanged in contour.ETT tip terminates approximately 4 -5 mm above the carina. LEFT IJ CVC tip terminates over the SVC. The enteric tube courses through the chest to the abdomen beyond the field-of-view. Small RIGHT pleural effusion. No pneumothorax. New platelike opacity in the LEFT mid lung, possibly fluid within the major fissure. Bibasilar opacities with LEFT retrocardiac opacity, overall improved since September twentieth, 2022. Mild interstitial prominence with indistinctness of the perihilar  vasculature. Small RIGHT visualized abdomen is unremarkable. IMPRESSION: 1. ETT tip terminates approximately 4-5 mm above the carina. Recommend repositioning. 2. Likely moderate LEFT pleural effusion with fluid within a fissure. Persistent bibasilar heterogeneous opacities superimposed on a background of pulmonary edema, overall improved since October 18, 2020. Electronically Signed   By: Valentino Saxon M.D.   On: 10/21/2020 14:38     Medications:     prismasol BGK 4/2.5 500 mL/hr at 10/23/20 0550    prismasol BGK 4/2.5 500 mL/hr at 10/23/20 0556   sodium chloride Stopped (10/22/20 0542)   sodium chloride     sodium chloride     feeding supplement (VITAL 1.5 CAL) 50 mL/hr at 10/23/20 0800   norepinephrine (LEVOPHED) Adult infusion Stopped (10/23/20 0507)   prismasol BGK 4/2.5 2,000 mL/hr at 10/23/20 0549   propofol (DIPRIVAN) infusion 35 mcg/kg/min (10/23/20 0900)    albuterol  2.5 mg Nebulization BID   calcitRIOL  0.25 mcg Per Tube Daily   chlorhexidine gluconate (MEDLINE KIT)  15 mL Mouth Rinse BID   Chlorhexidine Gluconate Cloth  6 each Topical Daily   docusate  100 mg Per Tube BID   ezetimibe  10 mg Per Tube Daily   febuxostat  40 mg Per NG tube Daily   feeding supplement (PROSource TF)  45 mL Per Tube TID   gabapentin  200 mg Per Tube Q12H   heparin injection (subcutaneous)  5,000 Units Subcutaneous Q8H   influenza vaccine adjuvanted  0.5 mL Intramuscular Tomorrow-1000   insulin aspart  0-15 Units Subcutaneous Q4H   insulin  aspart  10 Units Subcutaneous Q4H   insulin glargine-yfgn  20 Units Subcutaneous Daily   insulin glargine-yfgn  30 Units Subcutaneous QHS   mouth rinse  15 mL Mouth Rinse 10 times per day   multivitamin  1 tablet Per Tube QHS   pantoprazole sodium  40 mg Per Tube Daily   polyethylene glycol  17 g Per Tube Daily   pravastatin  20 mg Per NG tube QAC breakfast   prednisoLONE acetate  1 drop Left Eye BID   sodium chloride flush  10-40 mL Intracatheter  Q12H   sodium chloride, sodium chloride, albuterol, alteplase, fentaNYL (SUBLIMAZE) injection, heparin, heparin, hydrALAZINE, HYDROcodone-acetaminophen, lidocaine (PF), lidocaine-prilocaine, ondansetron (ZOFRAN) IV, pentafluoroprop-tetrafluoroeth, sodium chloride, sodium chloride flush  Assessment/ Plan:  1.Acute kidney injury on CKD stage IV: Patient has longstanding CKD with baseline creatinine level seems to be around 2.6-2.9 however recently the GFR declined and creatinine level around 3.6 as outpatient.  Reportedly the biopsy result came back hypertensive and diabetic changes with severe interstitial fibrosis and tubular atrophy with mostly chronic changes.  -Discussed with critical care medicine.  Will initiate hemodialysis 10/19/2020 patient is clearly volume overloaded with significant azotemia.   .  Patient underwent successful dialysis 10/19/2020 and 10/20/2021.  Minimal urine output initiated CRRT 10/21/2020.  Continues to do well on CRRT no changes at this time     2 Post biopsy left kidney hematoma: 8 x 4 x 12 cm subcapsular hematoma along the left kidney leading to mass-effect on the renal parenchyma.  IR was consulted, no plan for embolization.  Continue to monitor CBC and imaging studies. -Transfuse as necessary.   3 Cardiac arrest 9/16-asystole -transferred from [facility] post arrest -brief course of CPR, 1 round of epi     4 Hyponatremia, resolved at this point  6 Metabolic acidosis: Controlled with CRRT   7 Hypertension, now hypotensive: pressor support per primary service   8 Anemia of CKD and blood loss postbiopsy: Monitor CBC and transfuse as needed.  9.  Ventilator dependent respiratory failure.  With intubation per critical care service  LOS: [place] _0 _1 :53 AM

## 2020-10-23 NOTE — Progress Notes (Signed)
eLink Physician-Brief Progress Note Patient Name: ANGELETTE GANUS DOB: [birth year] MRN: [id number]   Date of Service  10/23/2020  HPI/Events of Note  Notified of hypotension during CRRT with nephro decreasing UF.  eICU Interventions  Norepinephrine reordered and is to be infused via central/dialysis cath     Intervention Category Intermediate Interventions: Hypotension - evaluation and management  Judd Lien 10/23/2020, 9:04 PM

## 2020-10-24 ENCOUNTER — Encounter (HOSPITAL_COMMUNITY): Payer: Self-pay

## 2020-10-24 ENCOUNTER — Inpatient Hospital Stay (HOSPITAL_COMMUNITY): Payer: HMO

## 2020-10-24 DIAGNOSIS — J9601 Acute respiratory failure with hypoxia: Secondary | ICD-10-CM | POA: Diagnosis not present

## 2020-10-24 DIAGNOSIS — N179 Acute kidney failure, unspecified: Secondary | ICD-10-CM | POA: Diagnosis not present

## 2020-10-24 DIAGNOSIS — D72829 Elevated white blood cell count, unspecified: Secondary | ICD-10-CM | POA: Diagnosis not present

## 2020-10-24 DIAGNOSIS — N185 Chronic kidney disease, stage 5: Secondary | ICD-10-CM | POA: Diagnosis not present

## 2020-10-24 LAB — CBC
HCT: 30.4 % — ABNORMAL LOW (ref 36.0–46.0)
Hemoglobin: 9.2 g/dL — ABNORMAL LOW (ref 12.0–15.0)
MCH: 28.4 pg (ref 26.0–34.0)
MCHC: 30.3 g/dL (ref 30.0–36.0)
MCV: 93.8 fL (ref 80.0–100.0)
Platelets: 230 10*3/uL (ref 150–400)
RBC: 3.24 MIL/uL — ABNORMAL LOW (ref 3.87–5.11)
RDW: 20.4 % — ABNORMAL HIGH (ref 11.5–15.5)
WBC: 25.6 10*3/uL — ABNORMAL HIGH (ref 4.0–10.5)
nRBC: 8 % — ABNORMAL HIGH (ref 0.0–0.2)

## 2020-10-24 LAB — RENAL FUNCTION PANEL
Albumin: 2.3 g/dL — ABNORMAL LOW (ref 3.5–5.0)
Albumin: 2.3 g/dL — ABNORMAL LOW (ref 3.5–5.0)
Anion gap: 8 (ref 5–15)
Anion gap: 9 (ref 5–15)
BUN: 23 mg/dL (ref 8–23)
BUN: 30 mg/dL — ABNORMAL HIGH (ref 8–23)
CO2: 28 mmol/L (ref 22–32)
CO2: 22 mmol/L (ref 22–32)
Calcium: 8.7 mg/dL — ABNORMAL LOW (ref 8.9–10.3)
Calcium: 8.6 mg/dL — ABNORMAL LOW (ref 8.9–10.3)
Chloride: 100 mmol/L (ref 98–111)
Chloride: 105 mmol/L (ref 98–111)
Creatinine, Ser: 1.16 mg/dL — ABNORMAL HIGH (ref 0.44–1.00)
Creatinine, Ser: 1.51 mg/dL — ABNORMAL HIGH (ref 0.44–1.00)
GFR, Estimated: 51 mL/min — ABNORMAL LOW (ref >60)
GFR, Estimated: 37 mL/min — ABNORMAL LOW (ref >60)
Glucose, Bld: 246 mg/dL — ABNORMAL HIGH (ref 70–99)
Glucose, Bld: 234 mg/dL — ABNORMAL HIGH (ref 70–99)
Phosphorus: 2.3 mg/dL — ABNORMAL LOW (ref 2.5–4.6)
Phosphorus: 3.3 mg/dL (ref 2.5–4.6)
Potassium: 5.1 mmol/L (ref 3.5–5.1)
Potassium: 6.5 mmol/L (ref 3.5–5.1)
Sodium: 136 mmol/L (ref 135–145)
Sodium: 136 mmol/L (ref 135–145)

## 2020-10-24 LAB — GLUCOSE, CAPILLARY
Glucose-Capillary: 248 mg/dL — ABNORMAL HIGH (ref 70–99)
Glucose-Capillary: 220 mg/dL — ABNORMAL HIGH (ref 70–99)
Glucose-Capillary: 193 mg/dL — ABNORMAL HIGH (ref 70–99)
Glucose-Capillary: 228 mg/dL — ABNORMAL HIGH (ref 70–99)
Glucose-Capillary: 255 mg/dL — ABNORMAL HIGH (ref 70–99)
Glucose-Capillary: 250 mg/dL — ABNORMAL HIGH (ref 70–99)

## 2020-10-24 LAB — TRIGLYCERIDES: Triglycerides: 222 mg/dL — ABNORMAL HIGH (ref <150)

## 2020-10-24 LAB — MAGNESIUM: Magnesium: 2.8 mg/dL — ABNORMAL HIGH (ref 1.7–2.4)

## 2020-10-24 MED ORDER — INSULIN GLARGINE-YFGN 100 UNIT/ML ~~LOC~~ SOLN
30.0000 [IU] | Freq: Every day | SUBCUTANEOUS | Status: DC
  Administered 2020-10-24 – 2020-10-25 (×2): 30 [IU] via SUBCUTANEOUS
  Filled 2020-10-24 (×3): qty 0.3

## 2020-10-24 MED ORDER — SODIUM CHLORIDE 0.9 % IV SOLN
2.0000 g | Freq: Two times a day (BID) | INTRAVENOUS | Status: DC
  Administered 2020-10-24 – 2020-10-27 (×7): 2 g via INTRAVENOUS
  Filled 2020-10-24 (×7): qty 2

## 2020-10-24 MED ORDER — PRISMASOL BGK 0/2.5 32-2.5 MEQ/L EC SOLN
Status: DC
  Filled 2020-10-24 (×22): qty 5000

## 2020-10-24 MED ORDER — PRISMASOL BGK 0/2.5 32-2.5 MEQ/L EC SOLN
Status: DC
  Filled 2020-10-24 (×7): qty 5000

## 2020-10-24 MED ORDER — POTASSIUM & SODIUM PHOSPHATES 280-160-250 MG PO PACK
2.0000 | PACK | Freq: Three times a day (TID) | ORAL | Status: DC
  Administered 2020-10-24 (×2): 2
  Filled 2020-10-24 (×2): qty 2

## 2020-10-24 MED ORDER — VANCOMYCIN HCL IN DEXTROSE 1-5 GM/200ML-% IV SOLN
1000.0000 mg | INTRAVENOUS | Status: DC
  Administered 2020-10-25 – 2020-10-27 (×3): 1000 mg via INTRAVENOUS
  Filled 2020-10-24 (×3): qty 200

## 2020-10-24 MED ORDER — WHITE PETROLATUM EX OINT
TOPICAL_OINTMENT | CUTANEOUS | Status: AC
  Administered 2020-10-24: 0.2
  Filled 2020-10-24: qty 28.35

## 2020-10-24 MED ORDER — VANCOMYCIN HCL 1500 MG/300ML IV SOLN
1500.0000 mg | Freq: Once | INTRAVENOUS | Status: AC
  Administered 2020-10-24: 1500 mg via INTRAVENOUS
  Filled 2020-10-24: qty 300

## 2020-10-24 MED ORDER — CHLORHEXIDINE GLUCONATE CLOTH 2 % EX PADS
6.0000 | MEDICATED_PAD | Freq: Every day | CUTANEOUS | Status: DC
  Administered 2020-10-24 – 2020-10-29 (×7): 6 via TOPICAL

## 2020-10-24 MED ORDER — NOREPINEPHRINE 16 MG/250ML-% IV SOLN
0.0000 ug/min | INTRAVENOUS | Status: DC
  Administered 2020-10-24: 4 ug/min via INTRAVENOUS
  Administered 2020-10-25: 12 ug/min via INTRAVENOUS
  Administered 2020-10-26: 32 ug/min via INTRAVENOUS
  Administered 2020-10-26: 18 ug/min via INTRAVENOUS
  Administered 2020-10-27: 36 ug/min via INTRAVENOUS
  Administered 2020-10-27: 35 ug/min via INTRAVENOUS
  Administered 2020-10-27: 34 ug/min via INTRAVENOUS
  Administered 2020-10-29: 16 ug/min via INTRAVENOUS
  Administered 2020-10-30: 11 ug/min via INTRAVENOUS
  Filled 2020-10-24 (×10): qty 250

## 2020-10-24 NOTE — Progress Notes (Signed)
Pharmacy Antibiotic Note  Krista Strickland is a 70 y.o. female admitted on 10/07/2020 with sepsis. Patient had recent aspiration d/t intubation, and completed antibiotics course on 9/22 with Vancomycin/Cefepime >> Unasyn for aspiration pneumonia.   On 9/23, patient was extubated, developed respiratory distress and cardiac arrest, then re-intubated and started on CRRT.   Patient was hypotensive overnight, afebrile (on CRRT), WBC trending up at 25.6. Cultures ordered. Pharmacy has been consulted for Vancomycin and Cefepime dosing.  Plan: Start Cefepime 2 g IV q12h. Give loading dose of Vancomycin 1500 mg IV x 1, followed by Vancomycin 1000 mg IV q24h. Goal trough 15-20 mcg/mL.  Follow-up clinical status, renal function, cultures/sensitivities, abx plan, LOT, and obtain Vanc levels as appropriate.   Height: (P) 5\' 4"  (162.6 cm) Weight: 79.5 kg (175 lb 4.3 oz) IBW/kg (Calculated) : (P) 54.7  Temp (24hrs), Avg:97.9 F (36.6 C), Min:95.6 F (35.3 C), Max:99.1 F (37.3 C)  Recent Labs  Lab 10/21/20 0528 10/21/20 1305 10/22/20 0206 10/22/20 0920 10/22/20 1604 10/23/20 0336 10/23/20 1525 10/24/20 0427  WBC 18.7* 30.4* 21.9*  --   --  20.1*  --  25.6*  CREATININE 3.49* 4.20* 3.18*  --  2.12* 1.62* 1.40* 1.16*  LATICACIDVEN  --  2.6*  --  1.7  --   --   --   --     Estimated Creatinine Clearance: 46 mL/min (A) (by C-G formula based on SCr of 1.16 mg/dL (H)).    Allergies  Allergen Reactions   Benazepril Swelling   Metronidazole Hives   Mobic [Meloxicam] Hives   Penicillins Hives and Swelling    Tolerated Unasyn 10/17/20 - bsb   Sulfonamide Derivatives Hives    Antimicrobials this admission: Cefepime 9/26 >>  Vancomycin 9/26 >>  Unasyn 9/18 >> 9/22 Cefepime 9/17 >> 9/18 Vancomycin 9/17 x 1  Dose adjustments this admission:   Microbiology results: 9/26 Resp Cx: sent 9/26 BCx: sent 9/18 Resp Cx: GPC in pairs, PMN 9/17 BCx: ngtd 9/17 UCx: ng 9/14 MRSA PCR: not  detected   Thank you for allowing pharmacy to be a part of this patient's care.  Vance Peper, PharmD PGY1 Pharmacy Resident Phone [phone] 10/24/2020 10:02 AM   Please check AMION for all [place] phone numbers After 10:00 PM, call [place] [phone]

## 2020-10-24 NOTE — Progress Notes (Signed)
NAME:  Krista Strickland, MRN:  [id number], DOB:  1950-06-08, LOS: 12 ADMISSION DATE:  10/09/2020, CONSULTATION DATE: 10/15/2020 REFERRING MD: Dr. Roger Shelter, CHIEF COMPLAINT: Cardiac arrest  History of Present Illness:  Krista Strickland is a 70 y.o. female with a past medical history significant for CKD stage V, hypertension, type 2 diabetes, GERD, hyperlipidemia, and hypertrophic cardiomyopathy who presented to the emergency department) with left lower quadrant abdominal pain that began day of admission.  Of note patient had renal biopsy 10/07/2020.  On ED arrival patient was seen hypertensive with all other vital signs within normal limits.  Due to abdominal pain complaints with recent liver biopsy CT abdomen pelvis was obtained and revealed 8 x 4 x 12 cm left present subscapular hematoma for which intervention radiology was consulted and recommended monitoring but if symptoms worsen and arteriogram could be considered.  Late evening of 9/16 patient suffered cardiac arrest.  After returning from the restroom patient had episode dyspnea followed by bradycardia and later asystole.  She received 1 mg of epi with ACLS/CPR with ROSC achieved and was seen with spontaneous respirations but significantly diminished mentation resulting in decision to endotracheally intubate.  Given sudden onset of dyspnea post ambulation with resultant bradycardia concern for PE as cause of cardiac arrest was very high given renal function decision was made to forego CTA and began therapeutic heparin.  Heparin drip was later discontinued due to downtrending of hemoglobin.  PCCM was consulted for further management and decision was made to transfer patient to [facility] for high-level care. Since admission patient has required placement of vascath for new onset dialysis (initiated 9/20) and failed extubation on 9/23.     Significant Hospital Events: Including procedures, antibiotic start and stop dates in addition to other  pertinent events   9/13 admitted to [facility] with abdominal pain found to have a subcapsular left renal hematoma post biopsy 9/16 cardiac arrest likely due to aspiration 9/18 passed SBT but copious secretion 9/20 non-tunneled dialysis catheter placed and HD initiated 9/23 patient extubated, developed respiratory distress and cardiac arrest. Patient was re-intubated. Right IJ Vas-cath placed. Left IJ vas-cath removed.  Interim History / Subjective:  Hypotensive overnight and on levophed at 5 mcg/min.   Temp 95.6, warming blanket in place.  -1.3 L on CRRT, goal adjusted to even overnight.    Objective   Blood pressure 107/60, pulse 90, temperature 98.4 F (36.9 C), temperature source Axillary, resp. rate (!) 31, height (P) 5\' 4"  (1.626 m), weight 79.5 kg, SpO2 100 %.    Vent Mode: PRVC FiO2 (%):  [30 %-40 %] 30 % Set Rate:  [24 bmp] 24 bmp Vt Set:  [440 mL] 440 mL PEEP:  [5 cmH20-8 cmH20] 8 cmH20   Intake/Output Summary (Last 24 hours) at 10/24/2020 0736 Last data filed at 10/24/2020 0700 Gross per 24 hour  Intake 1861.23 ml  Output 3206 ml  Net -1344.77 ml    Filed Weights   10/22/20 0324 10/23/20 0250 10/24/20 0500  Weight: 84.9 kg 81.3 kg 79.5 kg    Examination: General:  elderly woman, no acute distress, intubated/sedated on propofol at 15 mcg/min HEENT: MM pink/moist, anicteric, atraumatic, left eye cataract Neuro: opens eyes to verbal stimuli, follows commands. RUE<LUE CV: rrr, no murmurs, SR on telemetry PULM:  course breath sounds, symmetric expansion, thick white secretions GI: soft, bsx4 active/hypoactive, non tender Extremities: warm/dry, lower extremity edema, capillary refill less than 3 seconds  Skin: no rashes or lesions  Resolved Hospital  Problem list   Demand cardiac ischemia Chronic hyponatremia Septic shock due to aspiration pneumonia  Assessment & Plan:  S/p PEA cardiac arrest due to hypoxia/hypercapnia Acute respiratory failure with  hypoxia -Failed extubation 9/23 -SBT pending this am -Globally weak on exam, ongoing peripheral edema.  Continue full vent support with lung protective strategies.    AKI CKD stage V now CKD stage Vd High anion gap metabolic acidosis due to kidney disease Underwent HD on 9/21 and 9/22. Started on CRRT 9/23 -Neph following -CRRT, goal euvolemia to negative in setting of new vasopressor requirements.  Leukocytosis Hypothermia  Hypotension Completed course of Unasyn for asp pneumonia on 9/22.  -Levophed to maintain MAP > 65, currently on 5 mcg/min -Obtain blood cx, CXR, respiratory culture. Anurice, no urinalysis. Abdominal exam benign. No open wounds. No diarrhea. Empiric antibiotics with Vanc/Cefepime.    Type 2 diabetes On TF. Hyperglycemic overnight -Blood Glucose goal 140-180. -SSI and 10U TF coverage -Semglee 30 Units qhs and 20 U in AM, Increase semglee to 30 U this am.   Perinephric hematoma post renal biopsy - Continue to monitor hemoglobin  Anemia of CKD  acute blood loss anemia, normocytic -Transfuse PRBC if HBG less than 7 -s/p PRBC transfusion on 9/24, h/g stable with no evidence of bleeding  Hypoalbuminemia with mild protein calorie malnutrition -continue TF  HX HOCM HX Hypertension HX Hyperlipidemia -Continue zetia -Holding home dilt, metop, and hydralazine   Best Practice    Diet/type: tubefeeds DVT prophylaxis: SCD and subq heparin 5000 units q8hours GI prophylaxis: PPI Lines: Central line Foley:  N/A Code Status:  full code Last date of multidisciplinary goals of care discussion: Contacted husband this am, he plans to come in around 1 pm today. Will provide full update and review hospital course/goals of care at that time.  Critical Care time: 94 minutes   Paulita Fujita, ACNP [facility] Office: [phone]   Please use secure chat PCCM on call pager [phone] until 7pm. Please call Elink 7p-7a.  [phone]

## 2020-10-24 NOTE — Progress Notes (Signed)
OT Cancellation Note  Patient Details Name: Krista Strickland MRN: [id number] DOB: 10-07-1950   Cancelled Treatment:    Reason Eval/Treat Not Completed: Patient not medically ready. Pt weaning from vent, remains on CRRT. Will continue to follow and initiate OT as appropriate.  Malka So 10/24/2020, 12:03 PM Nestor Lewandowsky, OTR/L Acute Rehabilitation Services Pager: [phone] Office: [phone]

## 2020-10-24 NOTE — Progress Notes (Signed)
Family update:  Husband and sister at bedside. Family via phone. We reviewed patient's clinical course including 2 respiratory arrests and failed extubation, acute on chronic kidney disease requiring CRRT, and new vasopressor requirement overnight. She is being worked up for infectious process and on broad spectrum antibiotics.  Patient tolerated short period of PSV today, but remains globally weak and concern for ability to be extubated due to her weakness.  We discussed what next steps would be including tracheostomy if we were to continue aggressive care. Husband does not believe that she would want or would take care of a tracheostomy and asked what we would do if we did not pursue a trach. Options including extubation without reintubation with plans for continued care if she does well vs comfort directed care if she were to fail.  He would like to talk with his family and we will continue to optimize her pulmonary status over the next couple of days. Plan is to have additional family meeting on Wed.

## 2020-10-24 NOTE — Progress Notes (Signed)
PT Cancellation Note  Patient Details Name: Krista Strickland MRN: [id number] DOB: 1950/03/03   Cancelled Treatment:    Reason Eval/Treat Not Completed: Patient not medically ready.  Hold today per RN, pt on CRRT and the vent. 10/24/2020  Ginger Carne., PT Acute Rehabilitation Services [phone]  (pager) [phone]  (office)   Tessie Fass Zohair Epp 10/24/2020, 1:20 PM

## 2020-10-24 NOTE — Progress Notes (Addendum)
[facility] Progress Note   70 year old lady with stage IV chronic kidney disease diabetes mellitus type 2 hyperlipidemia hypertrophic cardiomyopathy presents to the emergency department left lower quadrant pain status postbiopsy 10/07/2020.  Was found to have 8 x 4 x 12 cm subcapsular hematoma.  Late evening 10/14/2020.  With cardiac arrest received 1 mg of epi with ACLS/CPR with ROSC achieved.  Concerns for pulmonary embolus.  Heparin initiated.  Transferred from [facility] to [facility].     Urine output minimal underwent dialysis 10/19/2020 with 1.5 L, 10/20/2021 with 2.5 L removed Status post respiratory arrest.  Requiring intubation.  Patient now on CRRT 10/21/2020  Assessment/ Plan:   1.Acute kidney injury on CKD stage IV: Patient has longstanding CKD with baseline creatinine level seems to be around 2.6-2.9 however recently the GFR declined and creatinine level around 3.6 as outpatient.  Reportedly the biopsy result came back hypertensive and diabetic changes with severe interstitial fibrosis and tubular atrophy with mostly chronic changes.  - Initiated   HD 10/19/2020  and 10/20/2021 before transitioning to  CRRT 10/21/2020.  Continues to do well on CRRT but BP low overnight -> will challenge with net UF 20m/hr as tolerated  Seen on CRRT 4K baths 500/500/2000 Net UF at 0 -> challenge at 50 ml/hr net neg.    2 Post biopsy left kidney hematoma: 8 x 4 x 12 cm subcapsular hematoma along the left kidney leading to mass-effect on the renal parenchyma.  IR was consulted, no plan for embolization.  Continue to monitor CBC and imaging studies. Stable now.   3 Cardiac arrest 9/16-asystole, transferred from [facility] post arrest w/ brief course of CPR, 1 round of epi   4 Hyponatremia, resolved at this point   6 Metabolic acidosis: Controlled with CRRT   7 Hypertension, now hypotensive: pressor support per primary service   8 Anemia of CKD and blood loss postbiopsy: Monitor CBC and  transfuse as needed.   9.  Ventilator dependent respiratory failure.  With intubation per critical care service  Subjective:   BP dropped overnight at which time net UF to zero. BP better today and Levophed at 4-5 mcg.   Objective:   BP 123/63   Pulse 91   Temp 98.4 F (36.9 C) (Axillary)   Resp (!) 34   Ht (P) '5\' 4"'  (1.626 m)   Wt 79.5 kg   SpO2 100%   BMI (P) 30.08 kg/m   Intake/Output Summary (Last 24 hours) at 10/24/2020 [id number] data filed at 10/24/2020 0800 Gross per 24 hour  Intake 1857.51 ml  Output 3085 ml  Net -1227.49 ml   Weight change: -1.8 kg  Physical Exam: General:intubated, sedation weaned down this am Heart:RRR Lungs:intuabted, bl chest expansion, no crackles Abdomen:soft, Non-tender, non-distended Extremities: Bilateral ankle edema present+, RIJ temp Neuro: sedated   Imaging: DG CHEST PORT 1 VIEW  Result Date: 10/23/2020 CLINICAL DATA:  Respiratory failure.  Follow-up study. EXAM: PORTABLE CHEST 1 VIEW COMPARISON:  10/21/2020 and older exams. FINDINGS: Cardiac silhouette normal in size. Left lung base opacity obscures the left hemidiaphragm. There is opacity blunting the right lateral costophrenic sulcus. Findings consistent with bilateral pleural effusions. Additional left lung base opacity is consistent with atelectasis or pneumonia. Hazy opacity noted throughout most of the right hemithorax on the prior study has improved. Small dense nodule at the right lung base consistent with a healed granuloma. Remainder of the lungs is now clear. No pneumothorax. Endotracheal tube, right internal jugular dual  lumen central venous catheter and nasal/orogastric tube are stable and well positioned. IMPRESSION: 1. Improved lung aeration compared to the prior exam. Hazy opacity noted throughout most of the right hemithorax on the prior study has mostly resolved. 2. Persistent left greater than right pleural effusions. Persistent left lung base airspace opacity consistent  with atelectasis or pneumonia. 3. No evidence of pulmonary edema. 4. Support apparatus is stable and well positioned. Electronically Signed   By: Lajean Manes M.D.   On: 10/23/2020 09:48    Labs: BMET Recent Labs  Lab 10/21/20 0528 10/21/20 1305 10/21/20 1314 10/21/20 1432 10/22/20 0206 10/22/20 1604 10/23/20 0336 10/23/20 1228 10/23/20 1525 10/24/20 0427  NA 140 140   < > 139 138 137 137 135 135 136  K 3.2* 3.9   < > 4.3 3.7 4.9 4.7 5.4* 5.2* 5.1  CL 100 100  --   --  102 101 101  --  102 100  CO2 28 27  --   --  '27 25 27  ' --  27 28  GLUCOSE 183* 298*  --   --  172* 214* 218*  --  220* 246*  BUN 69* 77*  --   --  62* 38* 27*  --  24* 23  CREATININE 3.49* 4.20*  --   --  3.18* 2.12* 1.62*  --  1.40* 1.16*  CALCIUM 9.0 9.1  --   --  8.4* 8.6* 8.7*  --  8.4* 8.7*  PHOS 5.7* 9.9*  --   --  5.1* 3.7 3.3  --  2.4* 2.3*   < > = values in this interval not displayed.   CBC Recent Labs  Lab 10/21/20 1305 10/21/20 1314 10/22/20 0206 10/23/20 0336 10/23/20 1228 10/24/20 0427  WBC 30.4*  --  21.9* 20.1*  --  25.6*  NEUTROABS 24.6*  --   --   --   --   --   HGB 7.4*   < > 7.0* 8.9* 9.5* 9.2*  HCT 25.4*   < > 23.0* 30.1* 28.0* 30.4*  MCV 98.8  --  95.0 93.2  --  93.8  PLT 361  --  287 255  --  230   < > = values in this interval not displayed.    Medications:     albuterol  2.5 mg Nebulization BID   calcitRIOL  0.25 mcg Per Tube Daily   chlorhexidine gluconate (MEDLINE KIT)  15 mL Mouth Rinse BID   Chlorhexidine Gluconate Cloth  6 each Topical Daily   docusate  100 mg Per Tube BID   ezetimibe  10 mg Per Tube Daily   febuxostat  40 mg Per NG tube Daily   feeding supplement (PROSource TF)  45 mL Per Tube TID   gabapentin  200 mg Per Tube Q12H   heparin injection (subcutaneous)  5,000 Units Subcutaneous Q8H   influenza vaccine adjuvanted  0.5 mL Intramuscular Tomorrow-1000   insulin aspart  0-15 Units Subcutaneous Q4H   insulin aspart  10 Units Subcutaneous Q4H   insulin  glargine-yfgn  30 Units Subcutaneous QHS   insulin glargine-yfgn  30 Units Subcutaneous Daily   mouth rinse  15 mL Mouth Rinse 10 times per day   multivitamin  1 tablet Per Tube QHS   pantoprazole sodium  40 mg Per Tube Daily   polyethylene glycol  17 g Per Tube Daily   pravastatin  20 mg Per NG tube QAC breakfast   prednisoLONE acetate  1 drop Left  Eye BID   sodium chloride flush  10-40 mL Intracatheter Q12H      Otelia Santee, MD 10/24/2020, 8:42 AM

## 2020-10-25 ENCOUNTER — Inpatient Hospital Stay (HOSPITAL_COMMUNITY): Payer: HMO

## 2020-10-25 DIAGNOSIS — R739 Hyperglycemia, unspecified: Secondary | ICD-10-CM

## 2020-10-25 DIAGNOSIS — I469 Cardiac arrest, cause unspecified: Secondary | ICD-10-CM | POA: Diagnosis not present

## 2020-10-25 DIAGNOSIS — N179 Acute kidney failure, unspecified: Secondary | ICD-10-CM | POA: Diagnosis not present

## 2020-10-25 DIAGNOSIS — A419 Sepsis, unspecified organism: Secondary | ICD-10-CM | POA: Diagnosis not present

## 2020-10-25 DIAGNOSIS — I5030 Unspecified diastolic (congestive) heart failure: Secondary | ICD-10-CM

## 2020-10-25 DIAGNOSIS — J9601 Acute respiratory failure with hypoxia: Secondary | ICD-10-CM | POA: Diagnosis not present

## 2020-10-25 LAB — CBC
HCT: 30.2 % — ABNORMAL LOW (ref 36.0–46.0)
Hemoglobin: 8.9 g/dL — ABNORMAL LOW (ref 12.0–15.0)
MCH: 28.3 pg (ref 26.0–34.0)
MCHC: 29.5 g/dL — ABNORMAL LOW (ref 30.0–36.0)
MCV: 95.9 fL (ref 80.0–100.0)
Platelets: 221 10*3/uL (ref 150–400)
RBC: 3.15 MIL/uL — ABNORMAL LOW (ref 3.87–5.11)
RDW: 20.9 % — ABNORMAL HIGH (ref 11.5–15.5)
WBC: 31 10*3/uL — ABNORMAL HIGH (ref 4.0–10.5)
nRBC: 7 % — ABNORMAL HIGH (ref 0.0–0.2)

## 2020-10-25 LAB — RENAL FUNCTION PANEL
Albumin: 2.2 g/dL — ABNORMAL LOW (ref 3.5–5.0)
Albumin: 2.4 g/dL — ABNORMAL LOW (ref 3.5–5.0)
Anion gap: 11 (ref 5–15)
Anion gap: 12 (ref 5–15)
BUN: 29 mg/dL — ABNORMAL HIGH (ref 8–23)
BUN: 23 mg/dL (ref 8–23)
CO2: 25 mmol/L (ref 22–32)
CO2: 21 mmol/L — ABNORMAL LOW (ref 22–32)
Calcium: 8.6 mg/dL — ABNORMAL LOW (ref 8.9–10.3)
Calcium: 8.5 mg/dL — ABNORMAL LOW (ref 8.9–10.3)
Chloride: 98 mmol/L (ref 98–111)
Chloride: 98 mmol/L (ref 98–111)
Creatinine, Ser: 1.33 mg/dL — ABNORMAL HIGH (ref 0.44–1.00)
Creatinine, Ser: 1.08 mg/dL — ABNORMAL HIGH (ref 0.44–1.00)
GFR, Estimated: 43 mL/min — ABNORMAL LOW (ref >60)
GFR, Estimated: 55 mL/min — ABNORMAL LOW (ref >60)
Glucose, Bld: 278 mg/dL — ABNORMAL HIGH (ref 70–99)
Glucose, Bld: 301 mg/dL — ABNORMAL HIGH (ref 70–99)
Phosphorus: 3.4 mg/dL (ref 2.5–4.6)
Phosphorus: 2.8 mg/dL (ref 2.5–4.6)
Potassium: 5.8 mmol/L — ABNORMAL HIGH (ref 3.5–5.1)
Potassium: 5 mmol/L (ref 3.5–5.1)
Sodium: 134 mmol/L — ABNORMAL LOW (ref 135–145)
Sodium: 131 mmol/L — ABNORMAL LOW (ref 135–145)

## 2020-10-25 LAB — GLUCOSE, CAPILLARY
Glucose-Capillary: 276 mg/dL — ABNORMAL HIGH (ref 70–99)
Glucose-Capillary: 198 mg/dL — ABNORMAL HIGH (ref 70–99)
Glucose-Capillary: 172 mg/dL — ABNORMAL HIGH (ref 70–99)
Glucose-Capillary: 204 mg/dL — ABNORMAL HIGH (ref 70–99)
Glucose-Capillary: 202 mg/dL — ABNORMAL HIGH (ref 70–99)

## 2020-10-25 LAB — BLOOD CULTURE ID PANEL (REFLEXED) - BCID2

## 2020-10-25 LAB — PATHOLOGIST SMEAR REVIEW

## 2020-10-25 LAB — ECHOCARDIOGRAM LIMITED
Height: 64 in
S' Lateral: 1.6 cm
Weight: 2804.25 oz

## 2020-10-25 LAB — TRIGLYCERIDES: Triglycerides: 314 mg/dL — ABNORMAL HIGH (ref <150)

## 2020-10-25 LAB — MAGNESIUM: Magnesium: 2.7 mg/dL — ABNORMAL HIGH (ref 1.7–2.4)

## 2020-10-25 LAB — SURGICAL PATHOLOGY

## 2020-10-25 MED ORDER — PROSOURCE TF PO LIQD
90.0000 mL | Freq: Three times a day (TID) | ORAL | Status: DC
  Administered 2020-10-25 – 2020-10-30 (×15): 90 mL
  Filled 2020-10-25 (×15): qty 90

## 2020-10-25 MED ORDER — B COMPLEX-C PO TABS
1.0000 | ORAL_TABLET | Freq: Every day | ORAL | Status: DC
  Administered 2020-10-25 – 2020-10-30 (×6): 1
  Filled 2020-10-25 (×6): qty 1

## 2020-10-25 MED ORDER — INSULIN GLARGINE-YFGN 100 UNIT/ML ~~LOC~~ SOLN
40.0000 [IU] | Freq: Two times a day (BID) | SUBCUTANEOUS | Status: DC
  Administered 2020-10-25 – 2020-10-26 (×3): 40 [IU] via SUBCUTANEOUS
  Filled 2020-10-25 (×5): qty 0.4

## 2020-10-25 MED ORDER — SODIUM ZIRCONIUM CYCLOSILICATE 10 G PO PACK
10.0000 g | PACK | Freq: Once | ORAL | Status: AC
  Administered 2020-10-25: 10 g
  Filled 2020-10-25: qty 1

## 2020-10-25 NOTE — Progress Notes (Signed)
[facility] Progress Note   70 year old lady with stage IV chronic kidney disease diabetes mellitus type 2 hyperlipidemia hypertrophic cardiomyopathy presents to the emergency department left lower quadrant pain status postbiopsy 10/07/2020.  Was found to have 8 x 4 x 12 cm subcapsular hematoma.  Late evening 10/14/2020.  With cardiac arrest received 1 mg of epi with ACLS/CPR with ROSC achieved.  Concerns for pulmonary embolus.  Heparin initiated.  Transferred from [facility] to [facility].     Urine output minimal underwent dialysis 10/19/2020 with 1.5 L, 10/20/2021 with 2.5 L removed Status post respiratory arrest.  Requiring intubation.  Patient now on CRRT 10/21/2020  Assessment/ Plan:   1.Acute kidney injury on CKD stage IV: Patient has longstanding CKD with baseline creatinine level seems to be around 2.6-2.9 however recently the GFR declined and creatinine level around 3.6 as outpatient.  Reportedly the biopsy result came back hypertensive and diabetic changes with severe interstitial fibrosis and tubular atrophy with mostly chronic changes.  - Initiated   HD 10/19/2020  and 10/20/2021 before transitioning to  CRRT 10/21/2020.  Continues to do well on CRRT ; tolerating net UF 19m/hr.  Seen on CRRT 2K baths 500/500/2000 Net UF at 50 and tolerating [facility]. Should be ok as K already decr with the 2K bath.   Stop KPhos.  2 Post biopsy left kidney hematoma: 8 x 4 x 12 cm subcapsular hematoma along the left kidney leading to mass-effect on the renal parenchyma.  IR was consulted, no plan for embolization.  Continue to monitor CBC and imaging studies. Stable now.   3 Cardiac arrest 9/16-asystole, transferred from [facility] post arrest w/ brief course of CPR, 1 round of epi   4 Hyponatremia, resolved at this point   6 Metabolic acidosis: Controlled with CRRT   7 Hypertension, now hypotensive: pressor support per primary  service   8 Anemia of CKD and blood loss postbiopsy: Monitor CBC and transfuse as needed.   9.  Ventilator dependent respiratory failure.  With intubation per critical care service  Subjective:   Had to change to 2K baths overnight BP better today and Levophed at 4-5 mcg.   Objective:   BP (!) 110/50   Pulse 86   Temp (!) 97.5 F (36.4 C) (Axillary)   Resp (!) 33   Ht (P) _0  (1.626 m)   Wt 79.5 kg   SpO2 97%   BMI (P) 30.08 kg/m   Intake/Output Summary (Last 24 hours) at 10/25/2020 [id number] data filed at 10/25/2020 0800 Gross per 24 hour  Intake 2389.37 ml  Output 1560 ml  Net 829.37 ml   Weight change: 0 kg  Physical Exam: General:intubated, sedation weaned down this am Heart:RRR Lungs:intubated, bl chest expansion, no crackles Abdomen:soft, Non-tender, non-distended Extremities: Bilateral ankle edema present+, RIJ temp Neuro: sedated   Imaging: DG CHEST PORT 1 VIEW  Result Date: 10/24/2020 CLINICAL DATA:  Sepsis. EXAM: PORTABLE CHEST 1 VIEW COMPARISON:  10/23/2020 FINDINGS: Endotracheal tube is appropriately 3.4 cm above the carina. Nasogastric tube extends into the abdomen but the tip is beyond the image. Right jugular dialysis catheter in the right innominate/upper SVC region and stable. Persistent consolidation or densities at the left lung base. Stable hazy densities at the right lung base. Calcified granuloma at the right lung base. Upper lungs remain clear without a pneumothorax. Heart size is stable. IMPRESSION: 1. Persistent bibasilar chest  densities, left side greater than right. Consolidation and/or pleural fluid at the left lung base. 2. Support apparatuses have minimally changed as described. Electronically Signed   By: Markus Daft M.D.   On: 10/24/2020 09:58   DG CHEST PORT 1 VIEW  Result Date: 10/23/2020 CLINICAL DATA:  Respiratory failure.  Follow-up study. EXAM: PORTABLE CHEST 1 VIEW COMPARISON:  10/21/2020 and older exams. FINDINGS: Cardiac silhouette  normal in size. Left lung base opacity obscures the left hemidiaphragm. There is opacity blunting the right lateral costophrenic sulcus. Findings consistent with bilateral pleural effusions. Additional left lung base opacity is consistent with atelectasis or pneumonia. Hazy opacity noted throughout most of the right hemithorax on the prior study has improved. Small dense nodule at the right lung base consistent with a healed granuloma. Remainder of the lungs is now clear. No pneumothorax. Endotracheal tube, right internal jugular dual lumen central venous catheter and nasal/orogastric tube are stable and well positioned. IMPRESSION: 1. Improved lung aeration compared to the prior exam. Hazy opacity noted throughout most of the right hemithorax on the prior study has mostly resolved. 2. Persistent left greater than right pleural effusions. Persistent left lung base airspace opacity consistent with atelectasis or pneumonia. 3. No evidence of pulmonary edema. 4. Support apparatus is stable and well positioned. Electronically Signed   By: Lajean Manes M.D.   On: 10/23/2020 09:48    Labs: BMET Recent Labs  Lab 10/22/20 0206 10/22/20 1604 10/23/20 0336 10/23/20 1228 10/23/20 1525 10/24/20 0427 10/24/20 1713 10/25/20 0203  NA 138 137 137 135 135 136 136 134*  K 3.7 4.9 4.7 5.4* 5.2* 5.1 6.5* 5.8*  CL 102 101 101  --  102 100 105 98  CO2 _0 --  _1 GLUCOSE 172* 214* 218*  --  220* 246* 234* 278*  BUN 62* 38* 27*  --  24* 23 30* 29*  CREATININE 3.18* 2.12* 1.62*  --  1.40* 1.16* 1.51* 1.33*  CALCIUM 8.4* 8.6* 8.7*  --  8.4* 8.7* 8.6* 8.6*  PHOS 5.1* 3.7 3.3  --  2.4* 2.3* 3.3 3.4   CBC Recent Labs  Lab 10/21/20 1305 10/21/20 1314 10/22/20 0206 10/23/20 0336 10/23/20 1228 10/24/20 0427 10/25/20 0203  WBC 30.4*  --  21.9* 20.1*  --  25.6* 31.0*  NEUTROABS 24.6*  --   --   --   --   --   --   HGB 7.4*   < > 7.0* 8.9* 9.5* 9.2* 8.9*  HCT 25.4*   < > 23.0* 30.1* 28.0* 30.4*  30.2*  MCV 98.8  --  95.0 93.2  --  93.8 95.9  PLT 361  --  287 255  --  230 221   < > = values in this interval not displayed.    Medications:     albuterol  2.5 mg Nebulization BID   calcitRIOL  0.25 mcg Per Tube Daily   chlorhexidine gluconate (MEDLINE KIT)  15 mL Mouth Rinse BID   Chlorhexidine Gluconate Cloth  6 each Topical Daily   docusate  100 mg Per Tube BID   ezetimibe  10 mg Per Tube Daily   febuxostat  40 mg Per NG tube Daily   feeding supplement (PROSource TF)  45 mL Per Tube TID   gabapentin  200 mg Per Tube Q12H   heparin injection (subcutaneous)  5,000 Units Subcutaneous Q8H   influenza vaccine adjuvanted  0.5 mL Intramuscular Tomorrow-1000   insulin aspart  0-15 Units Subcutaneous Q4H   insulin aspart  10 Units Subcutaneous Q4H   insulin glargine-yfgn  30 Units Subcutaneous QHS   insulin glargine-yfgn  30 Units Subcutaneous Daily   mouth rinse  15 mL Mouth Rinse 10 times per day   multivitamin  1 tablet Per Tube QHS   pantoprazole sodium  40 mg Per Tube Daily   polyethylene glycol  17 g Per Tube Daily   potassium & sodium phosphates  2 packet Per Tube Q8H   pravastatin  20 mg Per NG tube QAC breakfast   prednisoLONE acetate  1 drop Left Eye BID   sodium chloride flush  10-40 mL Intracatheter Q12H      Otelia Santee, MD 10/25/2020, 8:19 AM

## 2020-10-25 NOTE — Progress Notes (Signed)
Nutrition Follow-up  DOCUMENTATION CODES:   Not applicable  INTERVENTION:   Continue tube feeding via OG tube: - Vital 1.5 @ 50 ml/hr (1200 ml/day) - ProSource TF 90 ml TID   Tube feeding regimen provides 2040 kcal, 147 grams of protein, and 917 ml of H2O.   Tube feeding regimen and current propofol provides 2441 total kcal (>100% of needs).   - B-complex with vitamin C to account for losses with CRRT  NUTRITION DIAGNOSIS:   Inadequate oral intake related to inability to eat as evidenced by NPO status.  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  9/16 - cardiac arrest likely due to aspiration (pt with thick, copious amount of greenish respiratory secretions), intubated 9/18 - TF initiated 9/20 - non-tunneled HD cath placed, first iHD 9/23 - extubated, cardiac arrest x 2, reintubated, CRRT initiated  Discussed pt with RN and during ICU rounds. Family meeting scheduled for 9/28 to discuss [place]. Pt remains on CRRT, requiring levophed.  Admit weight: 80.7 kg Current weight: 79.5 kg  Unsure of dry weight at this time. Pt continues to have mild pitting edema to BUE and deep pitting edema to BLE.  Patient is currently intubated on ventilator support MV: 12.4 L/min Temp (24hrs), Avg:97.5 F (36.4 C), Min:95.9 F (35.5 C), Max:98.7 F (37.1 C) BP (cuff): 100/63 MAP (cuff): 74  Drips: Propofol: 15.2 ml/hr (provides 401 kcal daily from  lipid) Levophed  Medications reviewed and include: calcitriol, colace, SSI q 4 hours, novolog 10 units q 4 hours, semglee 40 units BID, rena-vit, protonix, miralax, IV abx  Labs reviewed: sodium 134, potassium 5.8, BUN 29, creatinine 1.33, magnesium 2.7, TG 314, hemoglobin 8.9 CBG's: 172-276 x 24 hours  CRRT UF: 1549 ml x 24 hours I/O's: -1.7 L since admit  Diet Order:   Diet Order             Diet NPO time specified  Diet effective now                   EDUCATION NEEDS:   Not appropriate for education at this time  Skin:  Skin Assessment: Skin Integrity Issues: Incisions: L neck Other: MASD  Last BM:  10/25/20 large type 7  Height:   Ht Readings from Last 1 Encounters:  10/21/20 (P) '5\' 4"'  (1.626 m)    Weight:   Wt Readings from Last 1 Encounters:  10/25/20 79.5 kg    BMI:  Body mass index is 30.08 kg/m (pended).  Estimated Nutritional Needs:   Kcal:  2000-2200  Protein:  140-160 grams  Fluid:  1000 ml + UOP    Gustavus Bryant, MS, RD, LDN Inpatient Clinical Dietitian Please see AMiON for contact information.

## 2020-10-25 NOTE — Progress Notes (Signed)
OT Cancellation Note  Patient Details Name: Krista Strickland MRN: [id number] DOB: February 21, 1950   Cancelled Treatment:    Reason Eval/Treat Not Completed: Other (comment) OT signing off at this time.  Ramond Dial, OT/L   Acute OT Clinical Specialist Acute Rehabilitation Services Pager [phone] Office [phone] 10/25/2020, 10:24 AM

## 2020-10-25 NOTE — Progress Notes (Addendum)
NAME:  Krista Strickland, MRN:  [id number], DOB:  October 06, 1950, LOS: 52 ADMISSION DATE:  10/22/2020, CONSULTATION DATE: 10/15/2020 REFERRING MD: Dr. Roger Shelter, CHIEF COMPLAINT: Cardiac arrest  History of Present Illness:  Krista Strickland is a 70 y.o. female with a past medical history significant for CKD stage V, hypertension, type 2 diabetes, GERD, hyperlipidemia, and hypertrophic cardiomyopathy who presented to the emergency department) with left lower quadrant abdominal pain that began day of admission.  Of note patient had renal biopsy 10/07/2020.  On ED arrival patient was seen hypertensive with all other vital signs within normal limits.  Due to abdominal pain complaints with recent liver biopsy CT abdomen pelvis was obtained and revealed 8 x 4 x 12 cm left present subscapular hematoma for which intervention radiology was consulted and recommended monitoring but if symptoms worsen and arteriogram could be considered.  Late evening of 9/16 patient suffered cardiac arrest.  After returning from the restroom patient had episode dyspnea followed by bradycardia and later asystole.  She received 1 mg of epi with ACLS/CPR with ROSC achieved and was seen with spontaneous respirations but significantly diminished mentation resulting in decision to endotracheally intubate.  Given sudden onset of dyspnea post ambulation with resultant bradycardia concern for PE as cause of cardiac arrest was very high given renal function decision was made to forego CTA and began therapeutic heparin.  Heparin drip was later discontinued due to downtrending of hemoglobin.  PCCM was consulted for further management and decision was made to transfer patient to [facility] for high-level care. Since admission patient has required placement of vascath for new onset dialysis (initiated 9/20) and failed extubation on 9/23.     Significant Hospital Events: Including procedures, antibiotic start and stop dates in addition to other  pertinent events   9/13 admitted to [facility] with abdominal pain found to have a subcapsular left renal hematoma post biopsy 9/16 cardiac arrest likely due to aspiration 9/18 passed SBT but copious secretion 9/20 non-tunneled dialysis catheter placed and HD initiated 9/23 patient extubated, developed respiratory distress and cardiac arrest. Patient was re-intubated. Right IJ Vas-cath placed. Left IJ vas-cath removed.  Interim History / Subjective:  Remains on CVVHD, Levo at 7 mcg min, Propofol at 20 mcg/kg/min currently weaning on 5/8 with good sats, but remains profoundly weak. Family meeting scheduled for 9/28 to discuss goals of care. She is following simple commands.  Became hypothermic overnight. Current temp is 35.7 C>> 96.2 degrees F. Bear hugger is in use, and rectal temp is in place for monitoring.   Objective   Blood pressure (!) 104/46, pulse 84, temperature (!) 97 F (36.1 C), temperature source Oral, resp. rate (!) 26, height (P) 5\' 4"  (1.626 m), weight 79.5 kg, SpO2 96 %.    Vent Mode: PSV;CPAP FiO2 (%):  [30 %] 30 % Set Rate:  [24 bmp] 24 bmp Vt Set:  [440 mL-609 mL] 440 mL PEEP:  [8 cmH20] 8 cmH20 Pressure Support:  [13 cmH20] 13 cmH20 Plateau Pressure:  [16 cmH20-19 cmH20] 19 cmH20   Intake/Output Summary (Last 24 hours) at 10/25/2020 0932 Last data filed at 10/25/2020 3570 Gross per 24 hour  Intake 2495.81 ml  Output 1623 ml  Net 872.81 ml   Filed Weights   10/23/20 0250 10/24/20 0500 10/25/20 0500  Weight: 81.3 kg 79.5 kg 79.5 kg    Examination: General:  elderly woman, no acute distress, intubated/sedated on propofol at 20 mcg/min HEENT: MM pink/moist, anicteric, atraumatic, left eye cataract, thick  neck, unable to assess for JVD Neuro: opens eyes to verbal stimuli, follows   simple commands. RUE<LUE CV: rrr, no murmurs, SR on telemetry PULM:  bilateral chest excursion, course breath sounds,  thick white secretions GI: soft, bsx4 active/hypoactive, non  tender Extremities: warm/dry, lower extremity edema, capillary refill less than 3 seconds  Skin: no rashes or lesions  Labs and micro reviewed 9/27 Na 134, K 5.8, Glucose 270, creatinine 1.33, Calcium 8.6, Gap 11, Phos 3.4, Mag 2.7,  WBC 31.0, hypothermic, HGB 8.9, platelets 221  Pending Cx 9/26>> Blood >> CS Gram + Cocci>> 9/26 Tracheal Aspirate >> Pending  CXR 9/26>> Persistent bibasilar chest densities, left side greater than right. Consolidation and/or pleural fluid at the left lung base. Resolved Hospital Problem list   Demand cardiac ischemia Chronic hyponatremia Septic shock due to aspiration pneumonia  Assessment & Plan:  S/p PEA cardiac arrest due to hypoxia/hypercapnia Acute respiratory failure with hypoxia -Failed extubation 9/23 -tolerating CPAP/ PS trials well -Globally weak on exam, ongoing peripheral edema.   - Continue full vent support with lung protective strategies.   - CXR in am and prn   AKI CKD stage V now CKD stage Vd High anion gap metabolic acidosis due to kidney disease>> controlled with CRRT Underwent HD on 9/21 and 9/22. Started on CRRT 9/23 -Neph following, appreciate assist -CRRT, goal euvolemia to negative 50 cc's in setting of vasopressor requirements. - maintain renal perfusion, avoid nephrotoxic medications - Bladder scans prn for urine production   Leukocytosis, WBC continues to rise, hypothermia Hypothermia  Hypotension Completed course of Unasyn for asp pneumonia on 9/22.  -Levophed to maintain MAP > 65, currently on 7 mcg/min -Obtain blood cx, CXR, respiratory culture. Anurice, no urinalysis. Abdominal exam benign. No open wounds. No diarrhea. Empiric antibiotics with Vanc/Cefepime.    Type 2 diabetes On TF. Hyperglycemic overnight -Blood Glucose goal 140-180. -SSI and 10U TF coverage -Semglee 30 Units qhs and 20 U in AM, Increase semglee to 30 U this am.   Perinephric hematoma post renal biopsy - Continue to monitor hemoglobin,  platelets and watch for active bleeding  Anemia of CKD  acute blood loss anemia, normocytic -Transfuse PRBC if HBG less than 7 -s/p PRBC transfusion on 9/24, h/g stable with no evidence of bleeding  Hypoalbuminemia with mild protein calorie malnutrition -continue TF  HX HOCM HX Hypertension HX Hyperlipidemia -Continue zetia -Holding home dilt, metop, and hydralazine   Best Practice    Diet/type: tubefeeds DVT prophylaxis: SCD and subq heparin 5000 units q8hours GI prophylaxis: PPI Lines: Central line Foley:  N/A Code Status:  full code [name] NP spoke with husband 9/26. ( See note) He stated patient would not want to take care of trach. Plan for family conference 9/28 after husband can speak with additional family members re option for one way extubation , and if she is too weak to remain off life support, comfort care.  Critical Care time: 30 minutes   Magdalen Spatz, MSN, AGACNP-BC [place] for personal pager PCCM on call pager [phone]  10/25/2020 10:18 AM

## 2020-10-25 NOTE — Progress Notes (Addendum)
PHARMACY - PHYSICIAN COMMUNICATION CRITICAL VALUE ALERT - BLOOD CULTURE IDENTIFICATION (BCID)  Krista Strickland is an 70 y.o. female who presented to [facility] on 09/29/2020 with a chief complaint of sepsis.  Assessment: Sepsis of unknown origin. 1/2 bottles with GPC, BCID detected Staph Epi with MecA detected. Likely contaminant. May consider getting repeat blood cultures if indicated.  Name of physician (or Provider) Contacted: Dr. Shearon Stalls  Current antibiotics: Cefepime 2 g IV q12h, Vancomycin 1000 mg IV q24h with CRRT  Changes to prescribed antibiotics recommended:  Patient is on recommended antibiotics - No changes needed  Results for orders placed or performed during the hospital encounter of 10/07/2020  Blood Culture ID Panel (Reflexed) (Collected: 10/24/2020 10:12 AM)  Result Value Ref Range   Enterococcus faecalis NOT DETECTED NOT DETECTED   Enterococcus Faecium NOT DETECTED NOT DETECTED   Listeria monocytogenes NOT DETECTED NOT DETECTED   Staphylococcus species DETECTED (A) NOT DETECTED   Staphylococcus aureus (BCID) NOT DETECTED NOT DETECTED   Staphylococcus epidermidis DETECTED (A) NOT DETECTED   Staphylococcus lugdunensis NOT DETECTED NOT DETECTED   Streptococcus species NOT DETECTED NOT DETECTED   Streptococcus agalactiae NOT DETECTED NOT DETECTED   Streptococcus pneumoniae NOT DETECTED NOT DETECTED   Streptococcus pyogenes NOT DETECTED NOT DETECTED   A.calcoaceticus-baumannii NOT DETECTED NOT DETECTED   Bacteroides fragilis NOT DETECTED NOT DETECTED   Enterobacterales NOT DETECTED NOT DETECTED   Enterobacter cloacae complex NOT DETECTED NOT DETECTED   Escherichia coli NOT DETECTED NOT DETECTED   Klebsiella aerogenes NOT DETECTED NOT DETECTED   Klebsiella oxytoca NOT DETECTED NOT DETECTED   Klebsiella pneumoniae NOT DETECTED NOT DETECTED   Proteus species NOT DETECTED NOT DETECTED   Salmonella species NOT DETECTED NOT DETECTED   Serratia marcescens NOT DETECTED NOT  DETECTED   Haemophilus influenzae NOT DETECTED NOT DETECTED   Neisseria meningitidis NOT DETECTED NOT DETECTED   Pseudomonas aeruginosa NOT DETECTED NOT DETECTED   Stenotrophomonas maltophilia NOT DETECTED NOT DETECTED   Candida albicans NOT DETECTED NOT DETECTED   Candida auris NOT DETECTED NOT DETECTED   Candida glabrata NOT DETECTED NOT DETECTED   Candida krusei NOT DETECTED NOT DETECTED   Candida parapsilosis NOT DETECTED NOT DETECTED   Candida tropicalis NOT DETECTED NOT DETECTED   Cryptococcus neoformans/gattii NOT DETECTED NOT DETECTED   Methicillin resistance mecA/C DETECTED (A) NOT DETECTED    Vance Peper, PharmD PGY1 Pharmacy Resident Phone [phone] 10/25/2020 3:20 PM   Please check AMION for all [place] phone numbers After 10:00 PM, call [place] [phone]

## 2020-10-25 NOTE — Plan of Care (Signed)
  Problem: Education: Goal: Knowledge of General Education information will improve Description: Including pain rating scale, medication(s)/side effects and non-pharmacologic comfort measures Outcome: Progressing   Problem: Health Behavior/Discharge Planning: Goal: Ability to manage health-related needs will improve Outcome: Progressing   Problem: Clinical Measurements: Goal: Ability to maintain clinical measurements within normal limits will improve Outcome: Progressing Goal: Diagnostic test results will improve Outcome: Progressing Goal: Respiratory complications will improve Outcome: Progressing Goal: Cardiovascular complication will be avoided Outcome: Progressing   Problem: Activity: Goal: Risk for activity intolerance will decrease Outcome: Progressing   Problem: Nutrition: Goal: Adequate nutrition will be maintained Outcome: Progressing   Problem: Coping: Goal: Level of anxiety will decrease Outcome: Progressing   Problem: Elimination: Goal: Will not experience complications related to bowel motility Outcome: Progressing Goal: Will not experience complications related to urinary retention Outcome: Progressing   Problem: Pain Managment: Goal: General experience of comfort will improve Outcome: Progressing   Problem: Safety: Goal: Ability to remain free from injury will improve Outcome: Progressing   Problem: Skin Integrity: Goal: Risk for impaired skin integrity will decrease Outcome: Progressing   Problem: Education: Goal: Knowledge of disease and its progression will improve Outcome: Progressing   Problem: Clinical Measurements: Goal: Complications related to the disease process or treatment will be avoided or minimized Outcome: Progressing Goal: Dialysis access will remain free of complications Outcome: Progressing   Problem: Activity: Goal: Activity intolerance will improve Outcome: Progressing   Problem: Fluid Volume: Goal: Fluid volume  balance will be maintained or improved Outcome: Progressing   Problem: Respiratory: Goal: Respiratory symptoms related to disease process will be avoided Outcome: Progressing   Problem: Self-Concept: Goal: Body image disturbance will be avoided or minimized Outcome: Progressing   Problem: Urinary Elimination: Goal: Progression of disease will be identified and treated Outcome: Progressing

## 2020-10-25 NOTE — Progress Notes (Signed)
  Echocardiogram 2D Echocardiogram has been performed.  Krista Strickland 10/25/2020, 3:02 PM

## 2020-10-25 NOTE — Progress Notes (Signed)
PT Cancellation Note  Patient Details Name: Krista Strickland MRN: [id number] DOB: 10-25-1950   Cancelled Treatment:    Reason Eval/Treat Not Completed: Medical issues which prohibited therapy (pt on vent, CRRT, hypotensive and too medically unstable for therapy at this time. will sign off and please reorder as pt status changes)   Krista Strickland B Tatiyana Foucher 10/25/2020, 7:21 AM Bayard Males, PT Acute Rehabilitation Services Pager: [phone] Office: [phone]

## 2020-10-26 ENCOUNTER — Inpatient Hospital Stay (HOSPITAL_COMMUNITY): Payer: HMO

## 2020-10-26 DIAGNOSIS — I469 Cardiac arrest, cause unspecified: Secondary | ICD-10-CM | POA: Diagnosis not present

## 2020-10-26 DIAGNOSIS — N179 Acute kidney failure, unspecified: Secondary | ICD-10-CM | POA: Diagnosis not present

## 2020-10-26 DIAGNOSIS — J9601 Acute respiratory failure with hypoxia: Secondary | ICD-10-CM | POA: Diagnosis not present

## 2020-10-26 DIAGNOSIS — S37012A Minor contusion of left kidney, initial encounter: Secondary | ICD-10-CM | POA: Diagnosis not present

## 2020-10-26 DIAGNOSIS — N185 Chronic kidney disease, stage 5: Secondary | ICD-10-CM | POA: Diagnosis not present

## 2020-10-26 LAB — COMPREHENSIVE METABOLIC PANEL
ALT: 84 U/L — ABNORMAL HIGH (ref 0–44)
AST: 57 U/L — ABNORMAL HIGH (ref 15–41)
Albumin: 2.2 g/dL — ABNORMAL LOW (ref 3.5–5.0)
Alkaline Phosphatase: 193 U/L — ABNORMAL HIGH (ref 38–126)
Anion gap: 10 (ref 5–15)
BUN: 25 mg/dL — ABNORMAL HIGH (ref 8–23)
CO2: 25 mmol/L (ref 22–32)
Calcium: 8.8 mg/dL — ABNORMAL LOW (ref 8.9–10.3)
Chloride: 98 mmol/L (ref 98–111)
Creatinine, Ser: 1.17 mg/dL — ABNORMAL HIGH (ref 0.44–1.00)
GFR, Estimated: 50 mL/min — ABNORMAL LOW (ref >60)
Glucose, Bld: 227 mg/dL — ABNORMAL HIGH (ref 70–99)
Potassium: 3.6 mmol/L (ref 3.5–5.1)
Sodium: 133 mmol/L — ABNORMAL LOW (ref 135–145)
Total Bilirubin: 1 mg/dL (ref 0.3–1.2)
Total Protein: 5.8 g/dL — ABNORMAL LOW (ref 6.5–8.1)

## 2020-10-26 LAB — CBC WITH DIFFERENTIAL/PLATELET
Abs Immature Granulocytes: 1.1 10*3/uL — ABNORMAL HIGH (ref 0.00–0.07)
Basophils Absolute: 0.2 10*3/uL — ABNORMAL HIGH (ref 0.0–0.1)
Basophils Relative: 1 %
Eosinophils Absolute: 0.4 10*3/uL (ref 0.0–0.5)
Eosinophils Relative: 1 %
HCT: 28.6 % — ABNORMAL LOW (ref 36.0–46.0)
Hemoglobin: 8.4 g/dL — ABNORMAL LOW (ref 12.0–15.0)
Immature Granulocytes: 4 %
Lymphocytes Relative: 8 %
Lymphs Abs: 2.2 10*3/uL (ref 0.7–4.0)
MCH: 28.4 pg (ref 26.0–34.0)
MCHC: 29.4 g/dL — ABNORMAL LOW (ref 30.0–36.0)
MCV: 96.6 fL (ref 80.0–100.0)
Monocytes Absolute: 2.6 10*3/uL — ABNORMAL HIGH (ref 0.1–1.0)
Monocytes Relative: 10 %
Neutro Abs: 20.9 10*3/uL — ABNORMAL HIGH (ref 1.7–7.7)
Neutrophils Relative %: 76 %
Platelets: 171 10*3/uL (ref 150–400)
RBC: 2.96 MIL/uL — ABNORMAL LOW (ref 3.87–5.11)
RDW: 21.2 % — ABNORMAL HIGH (ref 11.5–15.5)
WBC: 27.3 10*3/uL — ABNORMAL HIGH (ref 4.0–10.5)
nRBC: 8.4 % — ABNORMAL HIGH (ref 0.0–0.2)

## 2020-10-26 LAB — GLUCOSE, CAPILLARY
Glucose-Capillary: 227 mg/dL — ABNORMAL HIGH (ref 70–99)
Glucose-Capillary: 174 mg/dL — ABNORMAL HIGH (ref 70–99)
Glucose-Capillary: 191 mg/dL — ABNORMAL HIGH (ref 70–99)
Glucose-Capillary: 219 mg/dL — ABNORMAL HIGH (ref 70–99)
Glucose-Capillary: 292 mg/dL — ABNORMAL HIGH (ref 70–99)
Glucose-Capillary: 235 mg/dL — ABNORMAL HIGH (ref 70–99)

## 2020-10-26 LAB — RENAL FUNCTION PANEL
Albumin: 2.3 g/dL — ABNORMAL LOW (ref 3.5–5.0)
Anion gap: 8 (ref 5–15)
BUN: 24 mg/dL — ABNORMAL HIGH (ref 8–23)
CO2: 27 mmol/L (ref 22–32)
Calcium: 8.6 mg/dL — ABNORMAL LOW (ref 8.9–10.3)
Chloride: 97 mmol/L — ABNORMAL LOW (ref 98–111)
Creatinine, Ser: 1.24 mg/dL — ABNORMAL HIGH (ref 0.44–1.00)
GFR, Estimated: 47 mL/min — ABNORMAL LOW (ref >60)
Glucose, Bld: 324 mg/dL — ABNORMAL HIGH (ref 70–99)
Phosphorus: 2.5 mg/dL (ref 2.5–4.6)
Potassium: 3.9 mmol/L (ref 3.5–5.1)
Sodium: 132 mmol/L — ABNORMAL LOW (ref 135–145)

## 2020-10-26 LAB — PROCALCITONIN: Procalcitonin: 4.87 ng/mL

## 2020-10-26 LAB — MAGNESIUM: Magnesium: 2.6 mg/dL — ABNORMAL HIGH (ref 1.7–2.4)

## 2020-10-26 MED ORDER — PRISMASOL BGK 4/2.5 32-4-2.5 MEQ/L REPLACEMENT SOLN
Status: DC
  Filled 2020-10-26 (×4): qty 5000

## 2020-10-26 MED ORDER — FENTANYL BOLUS VIA INFUSION
25.0000 ug | INTRAVENOUS | Status: DC | PRN
  Administered 2020-10-26 (×4): 50 ug via INTRAVENOUS
  Administered 2020-10-28: 75 ug via INTRAVENOUS
  Administered 2020-10-28: 50 ug via INTRAVENOUS
  Filled 2020-10-26: qty 100

## 2020-10-26 MED ORDER — FENTANYL 2500MCG IN NS 250ML (10MCG/ML) PREMIX INFUSION
25.0000 ug/h | INTRAVENOUS | Status: DC
  Administered 2020-10-26: 25 ug/h via INTRAVENOUS
  Administered 2020-10-27: 150 ug/h via INTRAVENOUS
  Filled 2020-10-26 (×2): qty 250

## 2020-10-26 MED ORDER — INSULIN ASPART 100 UNIT/ML IJ SOLN
0.0000 [IU] | INTRAMUSCULAR | Status: DC
  Administered 2020-10-26 (×2): 7 [IU] via SUBCUTANEOUS
  Administered 2020-10-26: 11 [IU] via SUBCUTANEOUS
  Administered 2020-10-27: 4 [IU] via SUBCUTANEOUS
  Administered 2020-10-27 – 2020-10-28 (×7): 7 [IU] via SUBCUTANEOUS
  Administered 2020-10-28: 11 [IU] via SUBCUTANEOUS
  Administered 2020-10-28: 7 [IU] via SUBCUTANEOUS

## 2020-10-26 MED ORDER — PRISMASOL BGK 4/2.5 32-4-2.5 MEQ/L EC SOLN
Status: DC
  Filled 2020-10-26 (×15): qty 5000

## 2020-10-26 MED ORDER — DEXMEDETOMIDINE HCL IN NACL 400 MCG/100ML IV SOLN
0.4000 ug/kg/h | INTRAVENOUS | Status: DC
  Administered 2020-10-26: 0.4 ug/kg/h via INTRAVENOUS
  Administered 2020-10-26: 1.2 ug/kg/h via INTRAVENOUS
  Administered 2020-10-27: 0.9 ug/kg/h via INTRAVENOUS
  Administered 2020-10-27 (×4): 1.2 ug/kg/h via INTRAVENOUS
  Administered 2020-10-28: 1.1 ug/kg/h via INTRAVENOUS
  Administered 2020-10-28 (×2): 1 ug/kg/h via INTRAVENOUS
  Administered 2020-10-28: 1.2 ug/kg/h via INTRAVENOUS
  Administered 2020-10-29: 0.4 ug/kg/h via INTRAVENOUS
  Administered 2020-10-29 (×3): 1.2 ug/kg/h via INTRAVENOUS
  Administered 2020-10-29: 1 ug/kg/h via INTRAVENOUS
  Administered 2020-10-30 (×2): 1.2 ug/kg/h via INTRAVENOUS
  Filled 2020-10-26 (×2): qty 100
  Filled 2020-10-26: qty 200
  Filled 2020-10-26 (×10): qty 100
  Filled 2020-10-26: qty 200
  Filled 2020-10-26: qty 100

## 2020-10-26 NOTE — Progress Notes (Addendum)
NAME:  Krista Strickland, MRN:  [id number], DOB:  08-30-1950, LOS: 36 ADMISSION DATE:  10/19/2020, CONSULTATION DATE: 10/15/2020 REFERRING MD: Dr. Roger Shelter, CHIEF COMPLAINT: Cardiac arrest  History of Present Illness:  Krista Strickland is a 70 y.o. female with a past medical history significant for CKD stage V, hypertension, type 2 diabetes, GERD, hyperlipidemia, and hypertrophic cardiomyopathy who presented to the emergency department) with left lower quadrant abdominal pain that began day of admission.  Of note patient had renal biopsy 10/07/2020.  On ED arrival patient was seen hypertensive with all other vital signs within normal limits.  Due to abdominal pain complaints with recent liver biopsy CT abdomen pelvis was obtained and revealed 8 x 4 x 12 cm left present subscapular hematoma for which intervention radiology was consulted and recommended monitoring but if symptoms worsen and arteriogram could be considered.  Late evening of 9/16 patient suffered cardiac arrest.  After returning from the restroom patient had episode dyspnea followed by bradycardia and later asystole.  She received 1 mg of epi with ACLS/CPR with ROSC achieved and was seen with spontaneous respirations but significantly diminished mentation resulting in decision to endotracheally intubate.  Given sudden onset of dyspnea post ambulation with resultant bradycardia concern for PE as cause of cardiac arrest was very high given renal function decision was made to forego CTA and began therapeutic heparin.  Heparin drip was later discontinued due to downtrending of hemoglobin.  PCCM was consulted for further management and decision was made to transfer patient to [facility] for high-level care. Since admission patient has required placement of vascath for new onset dialysis (initiated 9/20) and failed extubation on 9/23.     Significant Hospital Events: Including procedures, antibiotic start and stop dates in addition to other  pertinent events   9/13 admitted to [facility] with abdominal pain found to have a subcapsular left renal hematoma post biopsy 9/16 cardiac arrest likely due to aspiration 9/18 passed SBT but copious secretion 9/20 non-tunneled dialysis catheter placed and HD initiated 9/23 patient extubated, developed respiratory distress and cardiac arrest. Patient was re-intubated. Right IJ Vas-cath placed. Left IJ vas-cath removed.  Interim History / Subjective:  On CRRT Levo at 14 mcg/min On propofol During neuro exam for nurses, MAE but not following commands purposefully Bare hugger off; temp 97-99 degrees F Remains intubated on PRVC; weak cough; thick tan secretions   Objective   Blood pressure (!) 97/38, pulse 93, temperature 99.3 F (37.4 C), resp. rate (!) 24, height (P) '5\' 4"'  (1.626 m), weight 79.5 kg, SpO2 100 %.    Vent Mode: PRVC FiO2 (%):  [30 %] 30 % Set Rate:  [24 bmp] 24 bmp Vt Set:  [440 mL] 440 mL PEEP:  [8 cmH20] 8 cmH20 Pressure Support:  [13 cmH20] 13 cmH20 Plateau Pressure:  [12 cmH20-19 cmH20] 19 cmH20   Intake/Output Summary (Last 24 hours) at 10/26/2020 0754 Last data filed at 10/26/2020 0700 Gross per 24 hour  Intake 2429.13 ml  Output 1898 ml  Net 531.13 ml    Filed Weights   10/24/20 0500 10/25/20 0500 10/26/20 0500  Weight: 79.5 kg 79.5 kg 79.5 kg    Examination: General: critically ill appearing on mech vent HEENT: MM pink/moist; ETT in place Neuro: opens eyes to verbal command but not following commands; moves extremities but not on command CV: D2K0, RRR, systolic murmur present PULM:  dim rhonchi BS bilaterally; on mech vent PRVC GI: soft, bsx4 active  Extremities: warm/dry, trace edema  Skin: no rashes or lesions   Labs Na 133, K 3.6, Glucose 227, Creat 1.17, BUN 25, Mag 2.6, Alk 193, AST 57, ALT 84 WBC 27.3, Hgb 8.4  Pending Cx 9/26 >> Blood >> CS Gram + Cocci>>staph epidermidis (likely contaminate; follow 2nd culture. 9/26 Tracheal Aspirate  >> Pending (no growth)  Echo 9/27: EF 70-75%; severe LVH; Grade I diastolic dysfunction; severe mitral annular calcification  CXR 9/28>> no significant change from prior CXR; bibasilar opacities L>R Resolved Hospital Problem list   Demand cardiac ischemia Chronic hyponatremia Septic shock due to aspiration pneumonia  Assessment & Plan:  S/p PEA cardiac arrest due to hypoxia/hypercapnia Acute respiratory failure with hypoxia: Failed extubation 9/23 P: -continue mech vent PRVC 6-8cc/kg -wean fio2/peep for sats >92% -VAP prevention in place -trend CXR -daily SBT/SAT -GOC conversation later today  AKI CKD stage V now CKD stage Vd High anion gap metabolic acidosis due to kidney disease>> controlled with CRRT Anuric Underwent HD on 9/21 and 9/22. Started on CRRT 9/23 P: -nephrology following -continue CRRT -trend BMP -prn bladder scans/monitor for UOP -Avoid nephrotoxic agents, ensure adequate renal perfusion  Leukocytosis, WBC continues to rise, hypothermia Hypothermia  Hypotension Completed course of Unasyn for asp pneumonia on 9/22.  P: -trend WBC/fever curve -continue levo for MAP >65 -follow Bcx2 and resp culture -continue vanc/cefepime  Type 2 diabetes On TF. Hyperglycemic overnight P: -Glucose range remains above 200s -will increase SSI to resistant -semglee increased yesterdy to 40 units BID -continue 10U TF coverage  Perinephric hematoma post renal biopsy P: - trend CMP/CBC -monitor for signs of bleeding  Anemia of CKD  acute blood loss anemia, normocytic P: -trend CBC -transfuse for Hgb <7  Hypoalbuminemia with mild protein calorie malnutrition P: -continue TF  HX HOCM HX Hypertension HX Hyperlipidemia P: -Continue zetia and statin -Holding home BP meds  Best Practice    Diet/type: tubefeeds DVT prophylaxis: SCD and subq heparin 5000 units q8hours GI prophylaxis: PPI Lines: Central line Foley:  N/A Code Status:  full code [name]  NP spoke with husband 9/26. ( See note) He stated patient would not want to take care of trach. Plan for family conference 9/28 after husband can speak with additional family members re option for one way extubation , and if she is too weak to remain off life support, comfort care.; family meeting 9/28 at 3 pm  Critical Care time: 34 minutes   JD Rexene Agent [facility] 10/26/2020, 8:20 AM  Please see [email or web address] for pager details.  From 7A-7P if no response, please call [phone]. After hours, please call ELink [phone].

## 2020-10-26 NOTE — Progress Notes (Signed)
  Interdisciplinary Goals of Care Family Meeting   Date carried out:: 10/26/2020  Location of the meeting: Conference room  Member's involved: Nurse Practitioner, Bedside Registered Nurse, and Family Member or next of kin  Durable Power of Attorney or acting medical decision maker: Jaquelyn Bitter Cleary-Husband   Discussion: We discussed goals of care for [employer] .  Jeanna Giuffre (husband) and sister were present at the family meeting in person and 1 sister and 1 brother were available over the phone. We discussed Mrs. Wisecup's prognosis and how she is not doing any better on the ventilator with her respiratory failure. She had a failed extubation and we have not been able to wean her off the ventilator. She has been on the vent x2 weeks. Also, she is needing more pressor support today due to her hypotension. She is in kidney failure, not making any urine, and requiring CRRT. I discussed the options of trach vs one way extubation to the family. I also discussed code status as her pressor requirements are increasing. Husband and sister that were present in person expressed they don't think Mrs. Collignon would want a trach, feeding tube, long term dialysis, and be in a facility. Family over the phone would still like to discuss with the whole family over the phone tonight and try to come to a decision. They state they will let us know a decision tonight or tomorrow regarding code status and tracheostomy vs one way extubation. Will  continue current full code and current support.  Code status: Full Code  Disposition: Continue current acute care  Time spent for the meeting: 55 minutes  Mick Sell 10/26/2020, 4:08 PM

## 2020-10-26 NOTE — Progress Notes (Signed)
[facility] Progress Note   70 year old lady with stage IV chronic kidney disease diabetes mellitus type 2 hyperlipidemia hypertrophic cardiomyopathy presents to the emergency department left lower quadrant pain status postbiopsy 10/07/2020.  Was found to have 8 x 4 x 12 cm subcapsular hematoma.  Late evening 10/14/2020.  With cardiac arrest received 1 mg of epi with ACLS/CPR with ROSC achieved.  Concerns for pulmonary embolus.  Heparin initiated.  Transferred from [facility] to [facility]     Urine output minimal underwent dialysis 10/19/2020 with 1.5 L, 10/20/2021 with 2.5 L removed Status post respiratory arrest.  Requiring intubation.  Patient now on CRRT 10/21/2020  Assessment/ Plan:   1.Acute kidney injury on CKD stage IV: Patient has longstanding CKD with baseline creatinine level seems to be around 2.6-2.9 however recently the GFR declined and creatinine level around 3.6 as outpatient.  Reportedly the biopsy result came back hypertensive and diabetic changes with severe interstitial fibrosis and tubular atrophy with mostly chronic changes.  - Initiated   HD 10/19/2020  and 10/20/2021 before transitioning to  CRRT 10/21/2020.  Continues to do well on CRRT ; tolerating net UF 2m/hr.  Seen on CRRT 2K baths 500/500/2000 Net UF at 50 and tolerating but still on Levophed 10 mcg; will continue same rate.  Will change back to 4K and evaluate response.   2 Post biopsy left kidney hematoma: 8 x 4 x 12 cm subcapsular hematoma along the left kidney leading to mass-effect on the renal parenchyma.  IR was consulted, no plan for embolization.  Continue to monitor CBC and imaging studies. Stable now.   3 Cardiac arrest 9/16-asystole, transferred from [facility] post arrest w/ brief course of CPR, 1 round of epi   4 Hyponatremia, resolved at this point   6 Metabolic acidosis: Controlled with CRRT   7 Hypertension, now hypotensive: pressor support per primary service   8 Anemia of CKD  and blood loss postbiopsy: Monitor CBC and transfuse as needed.   9.  Ventilator dependent respiratory failure.  With intubation per critical care service  Subjective:   Had to change to 2K baths overnight on 9/26 Issues with catheter overnight but better now;  Levophed at 10 mcg.   Objective:   BP 125/83 (BP Location: Right Wrist)   Pulse 86   Temp (!) 97 F (36.1 C) (Rectal)   Resp (!) 23   Ht (P) '5\' 4"'  (1.626 m)   Wt 79.5 kg   SpO2 100%   BMI (P) 30.08 kg/m   Intake/Output Summary (Last 24 hours) at 10/26/2020 0915 Last data filed at 10/26/2020 0900 Gross per 24 hour  Intake 2494.02 ml  Output 1880 ml  Net 614.02 ml   Weight change: 0 kg  Physical Exam: General:intubated, sedation weaned down this am Heart:RRR Lungs:intubated, bl chest expansion, no crackles Abdomen:soft, Non-tender, non-distended Extremities: Bilateral ankle edema present+, RIJ temp Neuro: sedated   Imaging: DG CHEST PORT 1 VIEW  Result Date: 10/26/2020 CLINICAL DATA:  Respiratory failure EXAM: PORTABLE CHEST 1 VIEW COMPARISON:  Two days prior FINDINGS: Stable appearance of the cardiomediastinal silhouette. There is an endotracheal tube that terminates in the midthoracic trachea, stable in position. There is an enteric tube which courses below the level of the diaphragm with the distal tip not visualized. There is a right-sided central venous catheter with tip in the SVC. Similar left lung base opacity. No pneumothorax. No new focal pulmonary process. No acute osseous abnormality. IMPRESSION: Stable appearance of the support  lines and tubes. Stable chest with similar appearance of primarily left lung base opacity. Electronically Signed   By: Albin Felling M.D.   On: 10/26/2020 09:09   ECHOCARDIOGRAM LIMITED  Result Date: 10/25/2020    ECHOCARDIOGRAM LIMITED REPORT   Patient Name:   Krista Strickland Date of Exam: 10/25/2020 Medical Rec #:  [id number]        Height:       64.0 in Accession #:    [id number]        Weight:       175.3 lb Date of Birth:  [birth year]        BSA:          1.850 m Patient Age:    25 years         BP:           100/63 mmHg Patient Gender: F                HR:           97 bpm. Exam Location:  Inpatient Procedure: 2D Echo, Cardiac Doppler and Color Doppler Indications:    CHF  History:        Patient has prior history of Echocardiogram examinations, most                 recent 10/15/2020. Hypertrophic Cardiomyopathy; Risk                 Factors:Hypertension, Diabetes and Dyslipidemia. Hx stroke.  Sonographer:    Clayton Lefort RDCS (AE) Referring Phys: [id number] Freddi Starr  Sonographer Comments: Echo performed with patient supine and on artificial respirator and patient is morbidly obese. IMPRESSIONS  1. Left ventricular ejection fraction, by estimation, is 70 to 75%. The left ventricle has hyperdynamic function. The left ventricle has no regional wall motion abnormalities. There is severe concentric left ventricular hypertrophy. Left ventricular diastolic parameters are consistent with Grade I diastolic dysfunction (impaired relaxation).  2. Right ventricular systolic function is normal. The right ventricular size is normal.  3. The mitral valve is normal in structure. Mild mitral valve regurgitation. No evidence of mitral stenosis. Severe mitral annular calcification.  4. The aortic valve is normal in structure. Aortic valve regurgitation is not visualized. No aortic stenosis is present.  5. The inferior vena cava is normal in size with greater than 50% respiratory variability, suggesting right atrial pressure of 3 mmHg. Comparison(s): No significant change from prior study. Prior images reviewed side by side. FINDINGS  Left Ventricle: Left ventricular ejection fraction, by estimation, is 70 to 75%. The left ventricle has hyperdynamic function. The left ventricle has no regional wall motion abnormalities. The left ventricular internal cavity size was normal in size. There is severe concentric  left ventricular hypertrophy. Left ventricular diastolic parameters are consistent with Grade I diastolic dysfunction (impaired relaxation). Right Ventricle: The right ventricular size is normal. No increase in right ventricular wall thickness. Right ventricular systolic function is normal. Left Atrium: Left atrial size was normal in size. Right Atrium: Right atrial size was normal in size. Pericardium: There is no evidence of pericardial effusion. Mitral Valve: The mitral valve is normal in structure. There is moderate thickening of the mitral valve leaflet(s). There is moderate calcification of the mitral valve leaflet(s). Severe mitral annular calcification. Mild mitral valve regurgitation. No evidence of mitral valve stenosis. Tricuspid Valve: The tricuspid valve is normal in structure. Tricuspid valve regurgitation is not demonstrated. No evidence of tricuspid stenosis. Aortic Valve:  The aortic valve is normal in structure. Aortic valve regurgitation is not visualized. No aortic stenosis is present. Pulmonic Valve: The pulmonic valve was normal in structure. Pulmonic valve regurgitation is not visualized. No evidence of pulmonic stenosis. Aorta: The aortic root is normal in size and structure. Venous: The inferior vena cava is normal in size with greater than 50% respiratory variability, suggesting right atrial pressure of 3 mmHg. IAS/Shunts: No atrial level shunt detected by color flow Doppler. LEFT VENTRICLE PLAX 2D LVIDd:         2.20 cm  Diastology LVIDs:         1.60 cm  LV e' medial: 3.59 cm/s LV PW:         2.30 cm LV IVS:        2.20 cm LVOT diam:     1.80 cm LVOT Area:     2.54 cm  IVC IVC diam: 1.80 cm LEFT ATRIUM         Index LA diam:    3.00 cm 1.62 cm/m   AORTA Ao Root diam: 3.10 cm Ao Asc diam:  2.40 cm  SHUNTS Systemic Diam: 1.80 cm Candee Furbish MD Electronically signed by Candee Furbish MD Signature Date/Time: 10/25/2020/3:20:54 PM    Final     Labs: BMET Recent Labs  Lab 10/22/20 1604  10/23/20 4627 10/23/20 1228 10/23/20 1525 10/24/20 0427 10/24/20 1713 10/25/20 0203 10/25/20 1417 10/26/20 0310  NA 137 137 135 135 136 136 134* 131* 133*  K 4.9 4.7 5.4* 5.2* 5.1 6.5* 5.8* 5.0 3.6  CL 101 101  --  102 100 105 98 98 98  CO2 25 27  --  '27 28 22 25 ' 21* 25  GLUCOSE 214* 218*  --  220* 246* 234* 278* 301* 227*  BUN 38* 27*  --  24* 23 30* 29* 23 25*  CREATININE 2.12* 1.62*  --  1.40* 1.16* 1.51* 1.33* 1.08* 1.17*  CALCIUM 8.6* 8.7*  --  8.4* 8.7* 8.6* 8.6* 8.5* 8.8*  PHOS 3.7 3.3  --  2.4* 2.3* 3.3 3.4 2.8  --    CBC Recent Labs  Lab 10/21/20 1305 10/21/20 1314 10/23/20 0336 10/23/20 1228 10/24/20 0427 10/25/20 0203 10/26/20 0310  WBC 30.4*   < > 20.1*  --  25.6* 31.0* 27.3*  NEUTROABS 24.6*  --   --   --   --   --  20.9*  HGB 7.4*   < > 8.9* 9.5* 9.2* 8.9* 8.4*  HCT 25.4*   < > 30.1* 28.0* 30.4* 30.2* 28.6*  MCV 98.8   < > 93.2  --  93.8 95.9 96.6  PLT 361   < > 255  --  230 221 171   < > = values in this interval not displayed.    Medications:     albuterol  2.5 mg Nebulization BID   B-complex with vitamin C  1 tablet Per Tube Daily   calcitRIOL  0.25 mcg Per Tube Daily   chlorhexidine gluconate (MEDLINE KIT)  15 mL Mouth Rinse BID   Chlorhexidine Gluconate Cloth  6 each Topical Daily   docusate  100 mg Per Tube BID   ezetimibe  10 mg Per Tube Daily   febuxostat  40 mg Per NG tube Daily   feeding supplement (PROSource TF)  90 mL Per Tube TID   gabapentin  200 mg Per Tube Q12H   heparin injection (subcutaneous)  5,000 Units Subcutaneous Q8H   influenza vaccine adjuvanted  0.5  mL Intramuscular Tomorrow-1000   insulin aspart  0-20 Units Subcutaneous Q4H   insulin aspart  10 Units Subcutaneous Q4H   insulin glargine-yfgn  40 Units Subcutaneous BID   mouth rinse  15 mL Mouth Rinse 10 times per day   pantoprazole sodium  40 mg Per Tube Daily   polyethylene glycol  17 g Per Tube Daily   pravastatin  20 mg Per NG tube QAC breakfast   prednisoLONE  acetate  1 drop Left Eye BID   sodium chloride flush  10-40 mL Intracatheter Q12H      Otelia Santee, MD 10/26/2020, 9:15 AM

## 2020-10-27 DIAGNOSIS — N185 Chronic kidney disease, stage 5: Secondary | ICD-10-CM | POA: Diagnosis not present

## 2020-10-27 DIAGNOSIS — Z515 Encounter for palliative care: Secondary | ICD-10-CM

## 2020-10-27 DIAGNOSIS — S37012A Minor contusion of left kidney, initial encounter: Secondary | ICD-10-CM | POA: Diagnosis not present

## 2020-10-27 DIAGNOSIS — N179 Acute kidney failure, unspecified: Secondary | ICD-10-CM | POA: Diagnosis not present

## 2020-10-27 DIAGNOSIS — Z978 Presence of other specified devices: Secondary | ICD-10-CM

## 2020-10-27 DIAGNOSIS — J9601 Acute respiratory failure with hypoxia: Secondary | ICD-10-CM | POA: Diagnosis not present

## 2020-10-27 LAB — POCT I-STAT 7, (LYTES, BLD GAS, ICA,H+H)
Acid-base deficit: 1 mmol/L (ref 0.0–2.0)
Bicarbonate: 24.1 mmol/L (ref 20.0–28.0)
Calcium, Ion: 1.21 mmol/L (ref 1.15–1.40)
HCT: 27 % — ABNORMAL LOW (ref 36.0–46.0)
Hemoglobin: 9.2 g/dL — ABNORMAL LOW (ref 12.0–15.0)
O2 Saturation: 98 %
Patient temperature: 103.3
Potassium: 5.4 mmol/L — ABNORMAL HIGH (ref 3.5–5.1)
Sodium: 133 mmol/L — ABNORMAL LOW (ref 135–145)
TCO2: 25 mmol/L (ref 22–32)
pCO2 arterial: 43.7 mmHg (ref 32.0–48.0)
pH, Arterial: 7.361 (ref 7.350–7.450)
pO2, Arterial: 126 mmHg — ABNORMAL HIGH (ref 83.0–108.0)

## 2020-10-27 LAB — RENAL FUNCTION PANEL
Albumin: 2.3 g/dL — ABNORMAL LOW (ref 3.5–5.0)
Anion gap: 9 (ref 5–15)
BUN: 25 mg/dL — ABNORMAL HIGH (ref 8–23)
CO2: 23 mmol/L (ref 22–32)
Calcium: 8.9 mg/dL (ref 8.9–10.3)
Chloride: 102 mmol/L (ref 98–111)
Creatinine, Ser: 1.22 mg/dL — ABNORMAL HIGH (ref 0.44–1.00)
GFR, Estimated: 48 mL/min — ABNORMAL LOW (ref >60)
Glucose, Bld: 243 mg/dL — ABNORMAL HIGH (ref 70–99)
Phosphorus: 2.6 mg/dL (ref 2.5–4.6)
Potassium: 4.9 mmol/L (ref 3.5–5.1)
Sodium: 134 mmol/L — ABNORMAL LOW (ref 135–145)

## 2020-10-27 LAB — PROCALCITONIN: Procalcitonin: 4.2 ng/mL

## 2020-10-27 LAB — GLUCOSE, CAPILLARY
Glucose-Capillary: 167 mg/dL — ABNORMAL HIGH (ref 70–99)
Glucose-Capillary: 208 mg/dL — ABNORMAL HIGH (ref 70–99)
Glucose-Capillary: 209 mg/dL — ABNORMAL HIGH (ref 70–99)
Glucose-Capillary: 211 mg/dL — ABNORMAL HIGH (ref 70–99)
Glucose-Capillary: 217 mg/dL — ABNORMAL HIGH (ref 70–99)
Glucose-Capillary: 223 mg/dL — ABNORMAL HIGH (ref 70–99)
Glucose-Capillary: 227 mg/dL — ABNORMAL HIGH (ref 70–99)

## 2020-10-27 LAB — CBC
HCT: 28.8 % — ABNORMAL LOW (ref 36.0–46.0)
Hemoglobin: 8.2 g/dL — ABNORMAL LOW (ref 12.0–15.0)
MCH: 28.2 pg (ref 26.0–34.0)
MCHC: 28.5 g/dL — ABNORMAL LOW (ref 30.0–36.0)
MCV: 99 fL (ref 80.0–100.0)
Platelets: 168 10*3/uL (ref 150–400)
RBC: 2.91 MIL/uL — ABNORMAL LOW (ref 3.87–5.11)
RDW: 21.5 % — ABNORMAL HIGH (ref 11.5–15.5)
WBC: 24.6 10*3/uL — ABNORMAL HIGH (ref 4.0–10.5)
nRBC: 19.5 % — ABNORMAL HIGH (ref 0.0–0.2)

## 2020-10-27 LAB — CULTURE, RESPIRATORY W GRAM STAIN: Culture: NO GROWTH

## 2020-10-27 LAB — MAGNESIUM: Magnesium: 2.7 mg/dL — ABNORMAL HIGH (ref 1.7–2.4)

## 2020-10-27 MED ORDER — CEFEPIME HCL 1 G IJ SOLR
1.0000 g | INTRAMUSCULAR | Status: AC
  Administered 2020-10-28 – 2020-10-30 (×3): 1 g via INTRAVENOUS
  Filled 2020-10-27 (×3): qty 1

## 2020-10-27 MED ORDER — VANCOMYCIN VARIABLE DOSE PER UNSTABLE RENAL FUNCTION (PHARMACIST DOSING): Status: DC

## 2020-10-27 MED ORDER — INSULIN GLARGINE-YFGN 100 UNIT/ML ~~LOC~~ SOLN
45.0000 [IU] | Freq: Two times a day (BID) | SUBCUTANEOUS | Status: DC
  Administered 2020-10-27 – 2020-10-28 (×3): 45 [IU] via SUBCUTANEOUS
  Filled 2020-10-27 (×4): qty 0.45

## 2020-10-27 MED ORDER — ACETAMINOPHEN 160 MG/5ML PO SOLN
320.0000 mg | Freq: Four times a day (QID) | ORAL | Status: DC | PRN
  Administered 2020-10-27 – 2020-10-29 (×2): 320 mg
  Filled 2020-10-27 (×2): qty 20.3

## 2020-10-27 MED ORDER — VASOPRESSIN 20 UNITS/100 ML INFUSION FOR SHOCK
0.0000 [IU]/min | INTRAVENOUS | Status: DC
Start: 2020-10-27 — End: 2020-10-30
  Administered 2020-10-27 – 2020-10-30 (×7): 0.03 [IU]/min via INTRAVENOUS
  Filled 2020-10-27 (×8): qty 100

## 2020-10-27 MED ORDER — ACETAMINOPHEN 80 MG PO CHEW: 320.0000 mg | CHEWABLE_TABLET | Freq: Four times a day (QID) | ORAL | Status: DC | PRN

## 2020-10-27 MED FILL — Medication: Qty: 1 | Status: AC

## 2020-10-27 NOTE — Progress Notes (Signed)
  Interdisciplinary Goals of Care Family Meeting   Date carried out:: 10/27/2020  Location of the meeting: Conference room  Member's involved: Nurse Practitioner and Family Member or next of kin  Durable Power of Attorney or acting medical decision maker: Valda Lamb    Discussion: We discussed goals of care for Tamera Punt  and Mrs. Bellew's sister in the conference room . They decided they would not want any compressions if Mrs. Riechers's heart were to stop and made her DNR. They would like to continue current therapy and hold off on tracheostomy for now. I expressed that Mrs. Basurto's condition is worsening and there is a possibility she could pass soon and recommended comfort care. Husband states he would like a few days to think about it and if his wife does not improve with current therapy will consider comfort care measures one day next week.   Code status: Full DNR  Disposition: Continue current acute care; would like a few days to make decision on comfort care measures. No trach for now  Time spent for the meeting: 30 minutes  Mick Sell 10/27/2020, 12:50 PM

## 2020-10-27 NOTE — Progress Notes (Signed)
[name] Progress Note   70 year old lady with stage IV chronic kidney disease diabetes mellitus type 2 hyperlipidemia hypertrophic cardiomyopathy presents to the emergency department left lower quadrant pain status postbiopsy 10/07/2020.  Was found to have 8 x 4 x 12 cm subcapsular hematoma.  Late evening 10/14/2020.  With cardiac arrest received 1 mg of epi with ACLS/CPR with ROSC achieved.  Concerns for pulmonary embolus.  Heparin initiated.  Transferred from [facility] to [facility].     Urine output minimal underwent dialysis 10/19/2020 with 1.5 L, 10/20/2021 with 2.5 L removed Status post respiratory arrest.  Requiring intubation.  Patient now on CRRT 10/21/2020  Assessment/ Plan:   1.Acute kidney injury on CKD stage IV: Patient has longstanding CKD with baseline creatinine level seems to be around 2.6-2.9 however recently the GFR declined and creatinine level around 3.6 as outpatient.  Reportedly the biopsy result came back hypertensive and diabetic changes with severe interstitial fibrosis and tubular atrophy with mostly chronic changes.  - Initiated   HD 10/19/2020  and 10/20/2021 before transitioning to  CRRT 10/21/2020.  Continues to do well on CRRT ; tolerating net UF 66m/hr.  Seen on CRRT 4K baths 500/500/2000 Net UF at 50 and tolerating but now on Levophed 40 mcg bec she needs to be sedated.  Family yesterday and family will update CCM team; given inability to wean off vent and poor prognosis I would not restart CRRT if filter clots off. Await update from family.   2 Post biopsy left kidney hematoma: 8 x 4 x 12 cm subcapsular hematoma along the left kidney leading to mass-effect on the renal parenchyma.  IR was consulted, no plan for embolization.  Continue to monitor CBC and imaging studies. Stable now.   3 Cardiac arrest 9/16-asystole, transferred from [facility] post arrest w/ brief course of CPR, 1 round of epi   4 Hyponatremia, resolved at this point   6  Metabolic acidosis: Controlled with CRRT   7 Hypertension, now hypotensive: pressor support per primary service   8 Anemia of CKD and blood loss postbiopsy: Monitor CBC and transfuse as needed.   9.  Ventilator dependent respiratory failure.  With intubation per critical care service  Subjective:   Had to change to 2K baths overnight on 9/26 but now back to 4K Issues with catheter overnight and this AM and appears to be very positional; filter not clotting.  Levophed at 40 mcg which is much higher but partially bec of sedation.   Objective:   BP 105/61 (BP Location: Left Leg)   Pulse 77   Temp (!) 97 F (36.1 C) (Rectal)   Resp 17   Ht (P) '5\' 4"'  (1.626 m)   Wt 76.7 kg   SpO2 100%   BMI (P) 29.02 kg/m   Intake/Output Summary (Last 24 hours) at 10/27/2020 1032 Last data filed at 10/27/2020 1000 Gross per 24 hour  Intake 3611.8 ml  Output 5023 ml  Net -1411.2 ml   Weight change: -2.8 kg  Physical Exam: General:intubated, sedation weaned down this am Heart:RRR Lungs:intubated, bl chest expansion, no crackles Abdomen:soft, Non-tender, non-distended Extremities: Bilateral ankle edema present+, RIJ temp Neuro: sedated   Imaging: DG CHEST PORT 1 VIEW  Result Date: 10/26/2020 CLINICAL DATA:  Respiratory failure EXAM: PORTABLE CHEST 1 VIEW COMPARISON:  Two days prior FINDINGS: Stable appearance of the cardiomediastinal silhouette. There is an endotracheal tube that terminates in the midthoracic trachea, stable in position. There is an enteric tube which courses  below the level of the diaphragm with the distal tip not visualized. There is a right-sided central venous catheter with tip in the SVC. Similar left lung base opacity. No pneumothorax. No new focal pulmonary process. No acute osseous abnormality. IMPRESSION: Stable appearance of the support lines and tubes. Stable chest with similar appearance of primarily left lung base opacity. Electronically Signed   By: Albin Felling M.D.    On: 10/26/2020 09:09   ECHOCARDIOGRAM LIMITED  Result Date: 10/25/2020    ECHOCARDIOGRAM LIMITED REPORT   Patient Name:   Krista Strickland Date of Exam: 10/25/2020 Medical Rec #:  [id number]        Height:       64.0 in Accession #:    [id number]       Weight:       175.3 lb Date of Birth:  1950-08-16        BSA:          1.850 m Patient Age:    70 years         BP:           100/63 mmHg Patient Gender: F                HR:           97 bpm. Exam Location:  Inpatient Procedure: 2D Echo, Cardiac Doppler and Color Doppler Indications:    CHF  History:        Patient has prior history of Echocardiogram examinations, most                 recent 10/15/2020. Hypertrophic Cardiomyopathy; Risk                 Factors:Hypertension, Diabetes and Dyslipidemia. Hx stroke.  Sonographer:    Clayton Lefort RDCS (AE) Referring Phys: [id number] Freddi Starr  Sonographer Comments: Echo performed with patient supine and on artificial respirator and patient is morbidly obese. IMPRESSIONS  1. Left ventricular ejection fraction, by estimation, is 70 to 75%. The left ventricle has hyperdynamic function. The left ventricle has no regional wall motion abnormalities. There is severe concentric left ventricular hypertrophy. Left ventricular diastolic parameters are consistent with Grade I diastolic dysfunction (impaired relaxation).  2. Right ventricular systolic function is normal. The right ventricular size is normal.  3. The mitral valve is normal in structure. Mild mitral valve regurgitation. No evidence of mitral stenosis. Severe mitral annular calcification.  4. The aortic valve is normal in structure. Aortic valve regurgitation is not visualized. No aortic stenosis is present.  5. The inferior vena cava is normal in size with greater than 50% respiratory variability, suggesting right atrial pressure of 3 mmHg. Comparison(s): No significant change from prior study. Prior images reviewed side by side. FINDINGS  Left Ventricle: Left  ventricular ejection fraction, by estimation, is 70 to 75%. The left ventricle has hyperdynamic function. The left ventricle has no regional wall motion abnormalities. The left ventricular internal cavity size was normal in size. There is severe concentric left ventricular hypertrophy. Left ventricular diastolic parameters are consistent with Grade I diastolic dysfunction (impaired relaxation). Right Ventricle: The right ventricular size is normal. No increase in right ventricular wall thickness. Right ventricular systolic function is normal. Left Atrium: Left atrial size was normal in size. Right Atrium: Right atrial size was normal in size. Pericardium: There is no evidence of pericardial effusion. Mitral Valve: The mitral valve is normal in structure. There is moderate thickening of the mitral valve  leaflet(s). There is moderate calcification of the mitral valve leaflet(s). Severe mitral annular calcification. Mild mitral valve regurgitation. No evidence of mitral valve stenosis. Tricuspid Valve: The tricuspid valve is normal in structure. Tricuspid valve regurgitation is not demonstrated. No evidence of tricuspid stenosis. Aortic Valve: The aortic valve is normal in structure. Aortic valve regurgitation is not visualized. No aortic stenosis is present. Pulmonic Valve: The pulmonic valve was normal in structure. Pulmonic valve regurgitation is not visualized. No evidence of pulmonic stenosis. Aorta: The aortic root is normal in size and structure. Venous: The inferior vena cava is normal in size with greater than 50% respiratory variability, suggesting right atrial pressure of 3 mmHg. IAS/Shunts: No atrial level shunt detected by color flow Doppler. LEFT VENTRICLE PLAX 2D LVIDd:         2.20 cm  Diastology LVIDs:         1.60 cm  LV e' medial: 3.59 cm/s LV PW:         2.30 cm LV IVS:        2.20 cm LVOT diam:     1.80 cm LVOT Area:     2.54 cm  IVC IVC diam: 1.80 cm LEFT ATRIUM         Index LA diam:    3.00 cm  1.62 cm/m   AORTA Ao Root diam: 3.10 cm Ao Asc diam:  2.40 cm  SHUNTS Systemic Diam: 1.80 cm Candee Furbish MD Electronically signed by Candee Furbish MD Signature Date/Time: 10/25/2020/3:20:54 PM    Final     Labs: BMET Recent Labs  Lab 10/23/20 1525 10/24/20 0427 10/24/20 1713 10/25/20 0203 10/25/20 1417 10/26/20 0310 10/26/20 1532 10/27/20 0329  NA 135 136 136 134* 131* 133* 132* 134*  K 5.2* 5.1 6.5* 5.8* 5.0 3.6 3.9 4.9  CL 102 100 105 98 98 98 97* 102  CO2 '27 28 22 25 ' 21* '25 27 23  ' GLUCOSE 220* 246* 234* 278* 301* 227* 324* 243*  BUN 24* 23 30* 29* 23 25* 24* 25*  CREATININE 1.40* 1.16* 1.51* 1.33* 1.08* 1.17* 1.24* 1.22*  CALCIUM 8.4* 8.7* 8.6* 8.6* 8.5* 8.8* 8.6* 8.9  PHOS 2.4* 2.3* 3.3 3.4 2.8  --  2.5 2.6   CBC Recent Labs  Lab 10/21/20 1305 10/21/20 1314 10/24/20 0427 10/25/20 0203 10/26/20 0310 10/27/20 0329  WBC 30.4*   < > 25.6* 31.0* 27.3* 24.6*  NEUTROABS 24.6*  --   --   --  20.9*  --   HGB 7.4*   < > 9.2* 8.9* 8.4* 8.2*  HCT 25.4*   < > 30.4* 30.2* 28.6* 28.8*  MCV 98.8   < > 93.8 95.9 96.6 99.0  PLT 361   < > 230 221 171 168   < > = values in this interval not displayed.    Medications:     albuterol  2.5 mg Nebulization BID   B-complex with vitamin C  1 tablet Per Tube Daily   calcitRIOL  0.25 mcg Per Tube Daily   chlorhexidine gluconate (MEDLINE KIT)  15 mL Mouth Rinse BID   Chlorhexidine Gluconate Cloth  6 each Topical Daily   docusate  100 mg Per Tube BID   ezetimibe  10 mg Per Tube Daily   febuxostat  40 mg Per NG tube Daily   feeding supplement (PROSource TF)  90 mL Per Tube TID   gabapentin  200 mg Per Tube Q12H   heparin injection (subcutaneous)  5,000 Units Subcutaneous Q8H  influenza vaccine adjuvanted  0.5 mL Intramuscular Tomorrow-1000   insulin aspart  0-20 Units Subcutaneous Q4H   insulin aspart  10 Units Subcutaneous Q4H   insulin glargine-yfgn  45 Units Subcutaneous BID   mouth rinse  15 mL Mouth Rinse 10 times per day    pantoprazole sodium  40 mg Per Tube Daily   polyethylene glycol  17 g Per Tube Daily   pravastatin  20 mg Per NG tube QAC breakfast   prednisoLONE acetate  1 drop Left Eye BID   sodium chloride flush  10-40 mL Intracatheter Q12H      Otelia Santee, MD 10/27/2020, 10:32 AM

## 2020-10-27 NOTE — Consult Note (Signed)
Consultation Note Date: 10/27/2020   Patient Name: Krista Strickland  DOB: [birth year]  MRN: [id number]  Age / Sex: 70 y.o., female  PCP: Fayrene Helper, MD Referring Physician: Spero Geralds, MD  Reason for Consultation: Establishing goals of care "poor prognosis, multi organ failure, trying to decide between trach and one way extubation"  HPI/Patient Profile: 70 y.o. female  with past medical history of CKD stage V, hypertension, type 2 diabetes, GERD, hyperlipidemia, and hypertrophic cardiomyopathy who presented to [facility] emergency department on 10/04/2020 with left lower quadrant abdominal pain. She had recently underwent liver biopsy on 10/07/20. CT abdomen/pelvis revealed a subscapular left renal hematoma for which IR recommended monitoring. Late evening of 9/16, patient went into cardiac arrest likely due to aspiration. She received 1 round epi and CPR with ROSC and spontaneous respirations but due to poor mentation she was subsequently intubated.  9/17 - PCCM consulted and patient transferred to [facility] 9/20 - HD initiated  9/23 - patient failed extubation due to respiratory distress and cardiac arrest.   Clinical Assessment and Goals of Care: Chart reviewed. PCCM discussed goals of care with patient's family this morning. It was discussed that patient remains critically ill requiring very aggressive care with CRRT, mechanical ventilation, and vasopressor support. Patient's husband and 1 sister were present in person and seemed to indicate patient would would not want a trach, permanent feeding tube, or to live in a facility. Apparently patient has a large family with other siblings living out of state. 1 brother and 1 sister (present by phone) requested to discuss further amongst the entire family before any decisions were made.   12:00 - I went to see patient at bedside. She is on 40 mcg levophed and  vasopressin. Remains on CRRT. Following commands per bedside RN.   I attempted to call husband with no answer. Secure voicemail left requesting call back.  1545 - Received update from bedside RN. Family has agreed to change code status to DNR.  CRRT was stopped earlier this afternoon because it was not running well.     SUMMARY OF RECOMMENDATIONS   DNR as previously documented Husband is primary decision maker but it seems does not want to create friction by making decisions that other family does not agree with PMT is available for support and additional [place] discussions as needed  Code Status/Advance Care Planning: DNR  Additional Recommendations (Limitations, Scope, Preferences): Full Scope Treatment  Prognosis:  Poor in the setting of multi-system organ failure; she is at very high risk for further decline  Discharge Planning: To Be Determined      Primary Diagnoses: Present on Admission:  Abdominal pain  Anemia in chronic kidney disease  Essential hypertension  Obesity (BMI 30.0-34.9)   I have reviewed the medical record, interviewed the patient and family, and examined the patient. The following aspects are pertinent.  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     Family History  Problem Relation Age of Onset   Hypertension Brother    Gout Brother    Prostate cancer Brother    Hypertension Brother    Prostate cancer Brother    Gout Brother    Hypertension Sister    Gout Sister    Leukemia  Sister 84   Pancreatic cancer Sister 57   Gout Sister    Prostate cancer Brother    Diabetes Neg Hx    Scheduled Meds:  albuterol  2.5 mg Nebulization BID   B-complex with vitamin C  1 tablet Per Tube Daily   calcitRIOL  0.25 mcg Per Tube Daily   chlorhexidine gluconate (MEDLINE KIT)  15 mL Mouth Rinse BID   Chlorhexidine Gluconate Cloth  6 each Topical Daily   docusate  100 mg Per Tube BID   ezetimibe  10 mg Per Tube Daily   febuxostat  40 mg Per NG tube Daily   feeding supplement (PROSource TF)  90 mL Per Tube TID   gabapentin  200 mg Per Tube Q12H   heparin injection (subcutaneous)  5,000 Units Subcutaneous Q8H   influenza vaccine adjuvanted  0.5 mL Intramuscular Tomorrow-1000   insulin aspart  0-20 Units Subcutaneous Q4H   insulin aspart  10 Units Subcutaneous Q4H   insulin glargine-yfgn  45 Units Subcutaneous BID   mouth rinse  15 mL Mouth Rinse 10 times per day   pantoprazole sodium  40 mg Per Tube Daily   polyethylene glycol  17 g Per Tube Daily   pravastatin  20 mg Per NG tube QAC breakfast   prednisoLONE acetate  1 drop Left Eye BID   sodium chloride flush  10-40 mL Intracatheter Q12H   Continuous Infusions:   prismasol BGK 4/2.5 500 mL/hr at 10/27/20 0858    prismasol BGK 4/2.5 500 mL/hr at 10/27/20 0924   sodium chloride Stopped (10/22/20 0542)   sodium chloride     sodium chloride     ceFEPime (MAXIPIME) IV 200 mL/hr at 10/27/20 1000   dexmedetomidine (PRECEDEX) IV infusion 1.2 mcg/kg/hr (10/27/20 1000)   feeding supplement (VITAL 1.5 CAL) 1,000 mL (10/26/20 1233)   fentaNYL infusion INTRAVENOUS 160 mcg/hr (10/27/20 1000)   heparin 10,000 units/ 20 mL infusion syringe 500 Units/hr (10/27/20 0346)   norepinephrine (LEVOPHED) Adult infusion 40 mcg/min (10/27/20 1000)   prismasol BGK 4/2.5 2,000 mL/hr at 10/27/20 0859   vancomycin 1,000 mg (10/27/20 1037)   vasopressin 0.03 Units/min (10/27/20 1054)   PRN Meds:.sodium chloride, sodium chloride, albuterol,  alteplase, fentaNYL, heparin, heparin, heparin, HYDROcodone-acetaminophen, lidocaine (PF), lidocaine-prilocaine, ondansetron (ZOFRAN) IV, pentafluoroprop-tetrafluoroeth, sodium chloride, sodium chloride flush   Allergies  Allergen Reactions   Benazepril Swelling   Metronidazole Hives   Mobic [Meloxicam] Hives   Penicillins Hives and Swelling    Tolerated Unasyn 10/17/20 - bsb   Sulfonamide Derivatives Hives     Physical Exam Vitals reviewed.  Constitutional:      Appearance: She is ill-appearing.     Comments: sedated  Cardiovascular:  Rate and Rhythm: Normal rate.  Pulmonary:     Comments: Intubated    Vital Signs: BP (!) 103/40   Pulse 80   Temp (!) 97 F (36.1 C)   Resp 18   Ht (P) 5' 4" (1.626 m)   Wt 76.7 kg   SpO2 100%   BMI (P) 29.02 kg/m  Pain Scale: CPOT POSS *See Group Information*: 2-Acceptable,Slightly drowsy, easily aroused Pain Score: 0-No pain   SpO2: SpO2: 100 % O2 Device:SpO2: 100 %   IO: Intake/output summary:  Intake/Output Summary (Last 24 hours) at 10/27/2020 1059 Last data filed at 10/27/2020 1000 Gross per 24 hour  Intake 3611.8 ml  Output 5023 ml  Net -1411.2 ml    LBM: Last BM Date: 10/27/20 Baseline Weight: Weight: 80.7 kg Most recent weight: Weight: 76.7 kg      Palliative Assessment/Data: PPS 30%     Time In: 1515 Time Out: 1545 Time Total: 30 minutes Greater than 50%  of this time was spent counseling and coordinating care related to the above assessment and plan.  Signed by: Julia B Mcilquham, NP   Please contact Palliative Medicine Team phone at [phone] for questions and concerns.  For individual provider: See Amion              

## 2020-10-27 NOTE — Progress Notes (Signed)
Pt placed back on full vent support due to increased RR and WOB. Pt tolerating well at this time, RN aware, MD aware, RT will continue to monitor.

## 2020-10-27 NOTE — Progress Notes (Signed)
CRRT stopped at 1220 today per nephrology.  Will continue to monitor patient.

## 2020-10-27 NOTE — Progress Notes (Signed)
Pharmacy Antibiotic Note  Krista Strickland is a 70 y.o. female admitted on 10/21/2020 with sepsis. Patient had recent aspiration d/t intubation, and completed antibiotics course on 9/22 with Vancomycin/Cefepime >> Unasyn for aspiration pneumonia. On 9/23, patient was extubated, developed respiratory distress and cardiac arrest, then re-intubated and started on CRRT.  Last dose of Vancomycin 1 g IV given on 9/29 @ 1037. CRRT stopped 9/29 at 1220 per nephrology. Pharmacy has been consulted for Vancomycin and Cefepime dosing. Will adjust dosing of antibiotics per renal function.    Plan: Changed to Cefepime 1 g IV q24h.   Changed to Vancomycin variable dosing per unstable renal function.  Follow-up Vanc random level on 9/30 @ 1200. Follow-up clinical status, renal function, cultures/sensitivities, abx plan, LOT.  Height: (P) 5\' 4"  (162.6 cm) Weight: 76.7 kg (169 lb 1.5 oz) IBW/kg (Calculated) : (P) 54.7  Temp (24hrs), Avg:97.7 F (36.5 C), Min:96.8 F (36 C), Max:99.5 F (37.5 C)  Recent Labs  Lab 10/21/20 1305 10/22/20 0206 10/22/20 0920 10/22/20 1604 10/23/20 0336 10/23/20 1525 10/24/20 0427 10/24/20 1713 10/25/20 0203 10/25/20 1417 10/26/20 0310 10/26/20 1532 10/27/20 0329  WBC 30.4*   < >  --   --  20.1*  --  25.6*  --  31.0*  --  27.3*  --  24.6*  CREATININE 4.20*   < >  --    < > 1.62*   < > 1.16*   < > 1.33* 1.08* 1.17* 1.24* 1.22*  LATICACIDVEN 2.6*  --  1.7  --   --   --   --   --   --   --   --   --   --    < > = values in this interval not displayed.     Estimated Creatinine Clearance: 43 mL/min (A) (by C-G formula based on SCr of 1.22 mg/dL (H)).    Allergies  Allergen Reactions   Benazepril Swelling   Metronidazole Hives   Mobic [Meloxicam] Hives   Penicillins Hives and Swelling    Tolerated Unasyn 10/17/20 - bsb   Sulfonamide Derivatives Hives    Antimicrobials this admission: Cefepime 9/26 >>  Vancomycin 9/26 >>  Unasyn 9/18 >> 9/22 Cefepime 9/17 >>  9/18 Vancomycin 9/17 x 1  Dose adjustments this admission: 9/29 Off CRRT   Microbiology results: 9/26 Resp Cx: ngtd 9/26 BCx: ngtd 9/26 BCx: staph epi - likely contaminant 9/18 Resp Cx: GPC in pairs, PMN 9/17 BCx: ngtd 9/17 UCx: ng 9/14 MRSA PCR: not detected   Thank you for allowing pharmacy to be a part of this patient's care.  Vance Peper, PharmD PGY1 Pharmacy Resident Phone [phone] 10/27/2020 1:52 PM   Please check AMION for all [place] phone numbers After 10:00 PM, call [place] [phone]

## 2020-10-27 NOTE — Progress Notes (Signed)
[facility] Progress Note Patient Name: CLEATUS GOODIN DOB: 07-02-1950 MRN: [id number]   Date of Service  10/27/2020  HPI/Events of Note  ABG = 7.361/43.7/126  eICU Interventions  Continue present management.     Intervention Category Major Interventions: Acid-Base disturbance - evaluation and management  Yisel Megill Cornelia Copa 10/27/2020, 10:41 PM

## 2020-10-27 NOTE — Progress Notes (Signed)
NAME:  Krista Strickland, MRN:  [id number], DOB:  10/31/1950, LOS: 75 ADMISSION DATE:  10/24/2020, CONSULTATION DATE: 10/15/2020 REFERRING MD: Dr. Roger Shelter, CHIEF COMPLAINT: Cardiac arrest  History of Present Illness:  Krista Strickland is a 70 y.o. female with a past medical history significant for CKD stage V, hypertension, type 2 diabetes, GERD, hyperlipidemia, and hypertrophic cardiomyopathy who presented to the emergency department) with left lower quadrant abdominal pain that began day of admission.  Of note patient had renal biopsy 10/07/2020.  On ED arrival patient was seen hypertensive with all other vital signs within normal limits.  Due to abdominal pain complaints with recent liver biopsy CT abdomen pelvis was obtained and revealed 8 x 4 x 12 cm left present subscapular hematoma for which intervention radiology was consulted and recommended monitoring but if symptoms worsen and arteriogram could be considered.  Late evening of 9/16 patient suffered cardiac arrest.  After returning from the restroom patient had episode dyspnea followed by bradycardia and later asystole.  She received 1 mg of epi with ACLS/CPR with ROSC achieved and was seen with spontaneous respirations but significantly diminished mentation resulting in decision to endotracheally intubate.  Given sudden onset of dyspnea post ambulation with resultant bradycardia concern for PE as cause of cardiac arrest was very high given renal function decision was made to forego CTA and began therapeutic heparin.  Heparin drip was later discontinued due to downtrending of hemoglobin.  PCCM was consulted for further management and decision was made to transfer patient to [facility] for high-level care. Since admission patient has required placement of vascath for new onset dialysis (initiated 9/20) and failed extubation on 9/23.     Significant Hospital Events: Including procedures, antibiotic start and stop dates in addition to other  pertinent events   9/13 admitted to [facility] with abdominal pain found to have a subcapsular left renal hematoma post biopsy 9/16 cardiac arrest likely due to aspiration 9/18 passed SBT but copious secretion 9/20 non-tunneled dialysis catheter placed and HD initiated 9/23 patient extubated, developed respiratory distress and cardiac arrest. Patient was re-intubated. Right IJ Vas-cath placed. Left IJ vas-cath removed.  Interim History / Subjective:  On CRRT Increasing Levo requirements at 38 mcg/min; was on 12 yesterday Off propofol and switch to precedex/fentanyl yesterday due to hypotension Nurses states she was more alert and awake today on neuro exam. Still not following commands purposefully  Remains intubated on PRVC; weak cough; thick tan secretions  Objective   Blood pressure (!) 94/49, pulse 78, temperature (!) 97 F (36.1 C), resp. rate 16, height (P) 5\' 4"  (1.626 m), weight 76.7 kg, SpO2 100 %.    Vent Mode: PRVC FiO2 (%):  [30 %] 30 % Set Rate:  [20 bmp] 20 bmp Vt Set:  [440 mL-4403 mL] 4403 mL PEEP:  [8 cmH20] 8 cmH20 Pressure Support:  [13 cmH20] 13 cmH20 Plateau Pressure:  [13 cmH20-20 cmH20] 16 cmH20   Intake/Output Summary (Last 24 hours) at 10/27/2020 0811 Last data filed at 10/27/2020 0800 Gross per 24 hour  Intake 3504.89 ml  Output 4937 ml  Net -1432.11 ml    Filed Weights   10/25/20 0500 10/26/20 0500 10/27/20 0400  Weight: 79.5 kg 79.5 kg 76.7 kg    Examination: General: critically ill appearing on mech vent HEENT: MM pink/moist; ETT in place Neuro: Nurses states she was more alert and awake today on neuro exam. Still not following commands purposefully. On precedex/fentanyl CV: P3X9, RRR, systolic murmur present PULM:  dim rhonchi BS bilaterally; on mech vent PRVC GI: soft, bsx4 active  Extremities: warm/dry, trace edema  Skin: no rashes or lesions   Labs Na 134, Glucose 243, BUN 25, Creat 1.22, mag 2.7 WBC 24.6 (27.3), Hgb 8.2  Pending  Cx 9/26 >> Blood >> CS Gram + Cocci>>staph epidermidis (likely contaminate; follow 2nd culture. 9/26 Tracheal Aspirate >> Pending (no growth)   Resolved Hospital Problem list   Demand cardiac ischemia Chronic hyponatremia Septic shock due to aspiration pneumonia  Assessment & Plan:  S/p PEA cardiac arrest due to hypoxia/hypercapnia Acute respiratory failure with hypoxia: Failed extubation 9/23 P: -continue mech vent PRVC 6-8cc/kg -wean fio2/peep for sats >92% -VAP prevention in place -trend CXR -daily SBT/SAT -palliative care consulted  AKI CKD stage V now CKD stage Vd High anion gap metabolic acidosis due to kidney disease>> controlled with CRRT Anuric Underwent HD on 9/21 and 9/22. Started on CRRT 9/23 P: -nephrology following -continue CRRT -trend BMP -prn bladder scans/monitor for UOP -Avoid nephrotoxic agents, ensure adequate renal perfusion  Septic Shock Leukocytosis, WBC continues to rise, hypothermia Hypothermia  Completed course of Unasyn for asp pneumonia on 9/22.  P: -trend WBC/fever curve -continue levo for MAP >65; consider adding vaso -follow Bcx2 and resp culture -continue vanc/cefepime  Acute metabolic encephalopathy P: -continue to monitor neuro checks -limit sedating medications  Type 2 diabetes On TF. Hyperglycemic overnight P: -Glucose range low 200s -continue SSI resistant and CBG monitoring -continue semglee 40 units BID -continue 10U TF coverage  Perinephric hematoma post renal biopsy P: -trend CMP/CBC -monitor for signs of bleeding  Anemia of CKD  acute blood loss anemia, normocytic P: -trend CBC -transfuse for Hgb <7  Hypoalbuminemia with mild protein calorie malnutrition P: -continue TF  HX HOCM HX Hypertension HX Hyperlipidemia P: -Continue zetia and statin -Holding home BP meds  Best Practice    Diet/type: tubefeeds DVT prophylaxis: SCD and subq heparin 5000 units q8hours GI prophylaxis: PPI Lines: Central  line Foley:  N/A Code Status:  full code Krista Strickland spoke with husband 9/26. ( See note) He stated patient would not want to take care of trach. Plan for family conference 9/28 after husband can speak with additional family members re option for one way extubation , and if she is too weak to remain off life support, comfort care.; family meeting 9/28 at 3 pm; 9/29 will reach out to family again today to see if any decisions have been made  Critical Care time: 35 minutes   JD Rexene Agent [facility] 10/27/2020, 8:11 AM  Please see [email or web address] for pager details.  From 7A-7P if no response, please call [phone]. After hours, please call ELink [phone].

## 2020-10-27 NOTE — Progress Notes (Signed)
Andres Labrum, PA, was notified of patient increasing temp despite cold packs and of decreased reponsiveness to stimulation and decreased BP . Fentanyl turned off per his order  to see if patient will have inc LOC w/less sedation and inc BP. Tylenol ordered for fever as well and given.

## 2020-10-27 NOTE — Progress Notes (Signed)
eLink Physician-Brief Progress Note Patient Name: Krista Strickland DOB: 1950-05-28 MRN: [id number]   Date of Service  10/27/2020  HPI/Events of Note  Hypotension - BP = 69/55. Nursing titrating Norepinephrine IV infusion up. ESRD. Norepinephrine and Vasopressin IV infusion running via pigtail on HD access. No CVP available. Temp = 102.5 in spite of Tylenol.   eICU Interventions  Plan: Cooling Blanket PRN. Increase ceiling on Norepinephrine IV infusion to 60 mcg/min. ABG STAT.     Intervention Category Major Interventions: Hypotension - evaluation and management  Lysle Dingwall 10/27/2020, 8:48 PM

## 2020-10-28 ENCOUNTER — Inpatient Hospital Stay (HOSPITAL_COMMUNITY): Payer: HMO

## 2020-10-28 DIAGNOSIS — J9601 Acute respiratory failure with hypoxia: Secondary | ICD-10-CM | POA: Diagnosis not present

## 2020-10-28 LAB — COMPREHENSIVE METABOLIC PANEL
ALT: 63 U/L — ABNORMAL HIGH (ref 0–44)
AST: 86 U/L — ABNORMAL HIGH (ref 15–41)
Albumin: 2.2 g/dL — ABNORMAL LOW (ref 3.5–5.0)
Alkaline Phosphatase: 160 U/L — ABNORMAL HIGH (ref 38–126)
Anion gap: 13 (ref 5–15)
BUN: 71 mg/dL — ABNORMAL HIGH (ref 8–23)
CO2: 21 mmol/L — ABNORMAL LOW (ref 22–32)
Calcium: 9.3 mg/dL (ref 8.9–10.3)
Chloride: 100 mmol/L (ref 98–111)
Creatinine, Ser: 3.36 mg/dL — ABNORMAL HIGH (ref 0.44–1.00)
GFR, Estimated: 14 mL/min — ABNORMAL LOW (ref >60)
Glucose, Bld: 261 mg/dL — ABNORMAL HIGH (ref 70–99)
Potassium: 4.7 mmol/L (ref 3.5–5.1)
Sodium: 134 mmol/L — ABNORMAL LOW (ref 135–145)
Total Bilirubin: 0.7 mg/dL (ref 0.3–1.2)
Total Protein: 5.9 g/dL — ABNORMAL LOW (ref 6.5–8.1)

## 2020-10-28 LAB — GLUCOSE, CAPILLARY
Glucose-Capillary: 263 mg/dL — ABNORMAL HIGH (ref 70–99)
Glucose-Capillary: 220 mg/dL — ABNORMAL HIGH (ref 70–99)
Glucose-Capillary: 216 mg/dL — ABNORMAL HIGH (ref 70–99)
Glucose-Capillary: 143 mg/dL — ABNORMAL HIGH (ref 70–99)
Glucose-Capillary: 151 mg/dL — ABNORMAL HIGH (ref 70–99)
Glucose-Capillary: 168 mg/dL — ABNORMAL HIGH (ref 70–99)
Glucose-Capillary: 215 mg/dL — ABNORMAL HIGH (ref 70–99)
Glucose-Capillary: 210 mg/dL — ABNORMAL HIGH (ref 70–99)
Glucose-Capillary: 227 mg/dL — ABNORMAL HIGH (ref 70–99)
Glucose-Capillary: 232 mg/dL — ABNORMAL HIGH (ref 70–99)
Glucose-Capillary: 221 mg/dL — ABNORMAL HIGH (ref 70–99)

## 2020-10-28 LAB — CULTURE, BLOOD (ROUTINE X 2): Special Requests: ADEQUATE

## 2020-10-28 LAB — VANCOMYCIN, RANDOM: Vancomycin Rm: 29

## 2020-10-28 LAB — TRIGLYCERIDES: Triglycerides: 318 mg/dL — ABNORMAL HIGH (ref <150)

## 2020-10-28 MED ORDER — INSULIN REGULAR(HUMAN) IN NACL 100-0.9 UT/100ML-% IV SOLN
INTRAVENOUS | Status: DC
  Administered 2020-10-28: 2.2 [IU]/h via INTRAVENOUS
  Administered 2020-10-29: 6 [IU]/h via INTRAVENOUS
  Filled 2020-10-28 (×3): qty 100

## 2020-10-28 MED ORDER — DEXTROSE 50 % IV SOLN: 0.0000 mL | INTRAVENOUS | Status: DC | PRN

## 2020-10-28 MED ORDER — LEVETIRACETAM IN NACL 1000 MG/100ML IV SOLN
1000.0000 mg | Freq: Once | INTRAVENOUS | Status: AC
  Administered 2020-10-28: 1000 mg via INTRAVENOUS
  Filled 2020-10-28: qty 100

## 2020-10-28 MED ORDER — SODIUM ZIRCONIUM CYCLOSILICATE 10 G PO PACK
10.0000 g | PACK | Freq: Once | ORAL | Status: AC
  Administered 2020-10-28: 10 g
  Filled 2020-10-28: qty 1

## 2020-10-28 MED ORDER — INSULIN ASPART 100 UNIT/ML IJ SOLN
12.0000 [IU] | INTRAMUSCULAR | Status: DC
  Administered 2020-10-28: 12 [IU] via INTRAVENOUS

## 2020-10-28 MED ORDER — LEVETIRACETAM IN NACL 500 MG/100ML IV SOLN
500.0000 mg | Freq: Two times a day (BID) | INTRAVENOUS | Status: DC
  Administered 2020-10-28 – 2020-10-29 (×2): 500 mg via INTRAVENOUS
  Filled 2020-10-28 (×2): qty 100

## 2020-10-28 MED ORDER — MIDAZOLAM HCL 2 MG/2ML IJ SOLN
1.0000 mg | INTRAMUSCULAR | Status: DC | PRN
  Administered 2020-10-28: 1 mg via INTRAVENOUS
  Filled 2020-10-28: qty 2

## 2020-10-28 NOTE — Progress Notes (Signed)
MD was notified at [facility]  9/29 of patient having non reactive right pupil. Physician had no further concerns. Lastly, patient had seizure like activity at 0420 this morning and MD was notified. Versed and Keppra were ordered and administered to help relieve seizure like jerking and twitching of face of upper right extremity.

## 2020-10-28 NOTE — Progress Notes (Signed)
Pt returned to full support vent settings at this time due to low RR. RT will continue to monitor and be available as needed.

## 2020-10-28 NOTE — Progress Notes (Signed)
Pharmacy Antibiotic Note  Krista Strickland is a 70 y.o. female admitted on 10/05/2020 with sepsis. Patient had recent aspiration d/t intubation, and completed antibiotics course on 9/22 with Vancomycin/Cefepime >> Unasyn for aspiration pneumonia. On 9/23, patient was extubated, developed respiratory distress and cardiac arrest, then re-intubated and started on CRRT.  Last dose of Vancomycin 1 g IV given on 9/29 @ 1037. CRRT stopped 9/29 at 1220 per nephrology. Pharmacy has been consulted for Vancomycin and Cefepime dosing.   Since CRRT stopped, ~24 hr Vanc random level is 29, would not redose until vanc level < 20. Will check another vanc random level in ~24 hrs.  Plan: Continue Cefepime 1 g IV q24h.   Continue Vancomycin variable dosing per unstable renal function.  Follow-up Vanc random level on 10/1 @ 1200. Follow-up clinical status, renal function, cultures/sensitivities, abx plan, LOT.  Height: 5\' 4"  (162.6 cm) Weight: 78.9 kg (173 lb 15.1 oz) IBW/kg (Calculated) : 54.7  Temp (24hrs), Avg:101.5 F (38.6 C), Min:96.6 F (35.9 C), Max:103.5 F (39.7 C)  Recent Labs  Lab 10/22/20 0920 10/22/20 1604 10/23/20 0336 10/23/20 1525 10/24/20 0427 10/24/20 1713 10/25/20 0203 10/25/20 1417 10/26/20 0310 10/26/20 1532 10/27/20 0329 10/28/20 0804 10/28/20 1247  WBC  --   --  20.1*  --  25.6*  --  31.0*  --  27.3*  --  24.6*  --   --   CREATININE  --    < > 1.62*   < > 1.16*   < > 1.33* 1.08* 1.17* 1.24* 1.22* 3.36*  --   LATICACIDVEN 1.7  --   --   --   --   --   --   --   --   --   --   --   --   VANCORANDOM  --   --   --   --   --   --   --   --   --   --   --   --  29   < > = values in this interval not displayed.    Estimated Creatinine Clearance: 15.8 mL/min (A) (by C-G formula based on SCr of 3.36 mg/dL (H)).    Allergies  Allergen Reactions   Benazepril Swelling   Metronidazole Hives   Mobic [Meloxicam] Hives   Penicillins Hives and Swelling    Tolerated Unasyn  10/17/20 - bsb   Sulfonamide Derivatives Hives    Antimicrobials this admission: Cefepime 9/26 >>  Vancomycin 9/26 >>  Unasyn 9/18 >> 9/22 Cefepime 9/17 >> 9/18 Vancomycin 9/17 x 1  Dose adjustments this admission: 9/29 Off CRRT   Microbiology results: 9/26 Resp Cx: ngtd 9/26 BCx: ngtd 9/26 BCx: staph epi - likely contaminant 9/18 Resp Cx: GPC in pairs, PMN 9/17 BCx: ngtd 9/17 UCx: ng 9/14 MRSA PCR: not detected   Thank you for allowing pharmacy to be a part of this patient's care.  Vance Peper, PharmD PGY1 Pharmacy Resident Phone [phone] 10/28/2020 3:22 PM   Please check AMION for all [place] phone numbers After 10:00 PM, call [place] [phone]

## 2020-10-28 NOTE — Progress Notes (Signed)
NAME:  Krista Strickland, MRN:  [id number], DOB:  [birth year], LOS: 16 ADMISSION DATE:  10/09/2020, CONSULTATION DATE: 10/15/2020 REFERRING MD: Dr. Roger Shelter, CHIEF COMPLAINT: Cardiac arrest  History of Present Illness:  Krista Strickland is a 70 y.o. female with a past medical history significant for CKD stage V, hypertension, type 2 diabetes, GERD, hyperlipidemia, and hypertrophic cardiomyopathy who presented to the emergency department) with left lower quadrant abdominal pain that began day of admission.  Of note patient had renal biopsy 10/07/2020.  On ED arrival patient was seen hypertensive with all other vital signs within normal limits.  Due to abdominal pain complaints with recent liver biopsy CT abdomen pelvis was obtained and revealed 8 x 4 x 12 cm left present subscapular hematoma for which intervention radiology was consulted and recommended monitoring but if symptoms worsen and arteriogram could be considered.  Late evening of 9/16 patient suffered cardiac arrest.  After returning from the restroom patient had episode dyspnea followed by bradycardia and later asystole.  She received 1 mg of epi with ACLS/CPR with ROSC achieved and was seen with spontaneous respirations but significantly diminished mentation resulting in decision to endotracheally intubate.  Given sudden onset of dyspnea post ambulation with resultant bradycardia concern for PE as cause of cardiac arrest was very high given renal function decision was made to forego CTA and began therapeutic heparin.  Heparin drip was later discontinued due to downtrending of hemoglobin.  PCCM was consulted for further management and decision was made to transfer patient to [facility] for high-level care. Since admission patient has required placement of vascath for new onset dialysis (initiated 9/20) and failed extubation on 9/23.     Significant Hospital Events: Including procedures, antibiotic start and stop dates in addition to other  pertinent events   9/13 admitted to [facility] with abdominal pain found to have a subcapsular left renal hematoma post biopsy 9/16 cardiac arrest likely due to aspiration 9/18 passed SBT but copious secretion 9/20 non-tunneled dialysis catheter placed and HD initiated 9/23 patient extubated, developed respiratory distress and cardiac arrest. Patient was re-intubated. Right IJ Vas-cath placed. Left IJ vas-cath removed. 9/28: increasing levo requirements 9/29: family discussion; decided to make DNR. Does not believe patient would want trach and be in long term facility. Needs a few more days to decide about one-way extubation/comfort measures. Levo maxed at 40; added vaso. CRRT turned off per nephro  Interim History / Subjective:  Overnight had seizure like/jerking activitiy. Described as twitching/jerking of face of RUE. Given 1 versed and keppra.   CRRT turned off per nephro No UOP Levo at 30; vaso 0.3 Off precedex and fentanyl and not waking up to commands; does not respond to name. Only responds to painful stimuli Remains on mechanical ventilation: currently on PSV trial   Objective   Blood pressure (!) 149/54, pulse 89, temperature (!) 96.6 F (35.9 C), temperature source Rectal, resp. rate 19, height 5\' 4"  (1.626 m), weight 78.9 kg, SpO2 100 %.    Vent Mode: PRVC FiO2 (%):  [30 %] 30 % Set Rate:  [20 bmp] 20 bmp Vt Set:  [440 mL] 440 mL PEEP:  [5 cmH20-8 cmH20] 5 cmH20 Pressure Support:  [13 cmH20] 13 cmH20 Plateau Pressure:  [16 cmH20-18 cmH20] 16 cmH20   Intake/Output Summary (Last 24 hours) at 10/28/2020 0743 Last data filed at 10/28/2020 0100 Gross per 24 hour  Intake 2570.06 ml  Output 1034 ml  Net 1536.06 ml    [employer]  10/26/20 0500 10/27/20 0400 10/28/20 0500  Weight: 79.5 kg 76.7 kg 78.9 kg    Examination: General: critically ill appearing on mech vent HEENT: MM pink/moist; ETT in place Neuro: Off precedex/fentanyl this am; not following  commands/responding to name. Only winces and withdrawals to pain. Cough/gag reflex in tact CV: Y1O1, RRR, systolic murmur present PULM:  dim rhonchi BS bilaterally; on mech vent PRVC GI: soft, bsx4 active  Extremities: warm/dry, trace edema  Skin: no rashes or lesions   Labs Cmp/cbc pending  Pending Cx 9/26 >> Blood >> CS Gram + Cocci>>staph epidermidis (likely contaminate; follow 2nd culture. 9/26 Tracheal Aspirate >> Pending (no growth)   Resolved Hospital Problem list   Demand cardiac ischemia Chronic hyponatremia Septic shock due to aspiration pneumonia  Assessment & Plan:  S/p PEA cardiac arrest due to hypoxia/hypercapnia Acute respiratory failure with hypoxia: Failed extubation 9/23 R>L lung opacities: possible pneumonia P: -continue mech vent PRVC 6-8cc/kg -daily PSV trials -wean fio2/peep for sats >92% -VAP prevention in place -trend CXR -daily SBT/SAT -palliative care consulted -continue to follow trach culture and continue abx -scheduled CPT  AKI CKD stage V now CKD stage Vd High anion gap metabolic acidosis due to kidney disease>> controlled with CRRT Anuric Underwent HD on 9/21 and 9/22. Started on CRRT 9/23 P: -nephrology following -CRRT turned off on 9/29: nephro contacting family about whether restarting -trend BMP -prn bladder scans/monitor for UOP -Avoid nephrotoxic agents, ensure adequate renal perfusion  Septic Shock Leukocytosis, WBC continues to rise, hypothermia Hypothermia  Completed course of Unasyn for asp pneumonia on 9/22.  P: -trend WBC/fever curve -continue to wean levo and vaso for MAP >65 -follow Bcx2 and resp culture -continue vanc/cefepime  Acute metabolic encephalopathy P: -continue to assess neuro checks -limit sedation for RASS 0 to -1 -avoid other sedating meds  Type 2 diabetes On TF. Hyperglycemic overnight P: -Glucose range remain 200s -continue SSI resistant and CBG monitoring -increased semglee 45 units  BID -increased 12 U TF coverage IV q4  Perinephric hematoma post renal biopsy P: -trend CMP/CBC -monitor for signs of bleeding  Anemia of CKD  acute blood loss anemia, normocytic P: -trend CBC -transfuse for Hgb <7  Hypoalbuminemia with mild protein calorie malnutrition P: -continue TF  HX HOCM HX Hypertension HX Hyperlipidemia P: -Continue zetia and statin -Holding home BP meds  Best Practice    Diet/type: tubefeeds DVT prophylaxis: SCD and subq heparin 5000 units q8hours GI prophylaxis: PPI Lines: Central line Foley:  N/A Code Status:  full code [name] NP spoke with husband 9/26. ( See note) He stated patient would not want to take care of trach. Plan for family conference 9/28 after husband can speak with additional family members re option for one way extubation , and if she is too weak to remain off life support, comfort care.; family meeting 9/28 at 3 pm; 9/29 husband and sister decided to make patient DNR. They would like a few more days (possibly early next week) to see if she improves any before deciding to do comfort care.  Critical Care time: 35 minutes   JD Rexene Agent [facility] 10/28/2020, 7:43 AM  Please see [email or web address] for pager details.  From 7A-7P if no response, please call [phone]. After hours, please call ELink [phone].

## 2020-10-28 NOTE — Progress Notes (Signed)
eLink Physician-Brief Progress Note Patient Name: Krista Strickland DOB: Jun 11, 1950 MRN: [id number]   Date of Service  10/28/2020  HPI/Events of Note  Patient experiencing twitching/jerking of shoulders and head. Eitiology? Seizures vs myoclonus?  eICU Interventions  Plan: Versed 1 mg IV Q 1 hour PRN seizures or myoclonus. Keppra 1 gm IV now, then 500 mg IV Q 12 hours.      Intervention Category Major Interventions: Seizures - evaluation and management  Sherree Shankman Eugene 10/28/2020, 4:32 AM

## 2020-10-28 NOTE — Progress Notes (Signed)
[facility] Progress Note   70 year old lady with stage IV chronic kidney disease diabetes mellitus type 2 hyperlipidemia hypertrophic cardiomyopathy presents to the emergency department left lower quadrant pain status postbiopsy 10/07/2020.  Was found to have 8 x 4 x 12 cm subcapsular hematoma.  Late evening 10/14/2020.  With cardiac arrest received 1 mg of epi with ACLS/CPR with ROSC achieved.  Concerns for pulmonary embolus.  Heparin initiated.  Transferred from [facility] to [facility].     Urine output minimal underwent dialysis 10/19/2020 with 1.5 L, 10/20/2021 with 2.5 L removed Status post respiratory arrest.  Requiring intubation.  Patient started on CRRT 10/21/2020  Assessment/ Plan:   1.Acute kidney injury on CKD stage IV: Patient has longstanding CKD with baseline creatinine level seems to be around 2.6-2.9 however recently the GFR declined and creatinine level around 3.6 as outpatient.  Reportedly the biopsy result came back hypertensive and diabetic changes with severe interstitial fibrosis and tubular atrophy with mostly chronic changes.  - Initiated   HD 10/19/2020  and 10/20/2021 before transitioning to  CRRT 10/21/2020-10/27/20 -CRRT stopped on 9/29 due to worsening hypotension and overall poor clinical picture.  Family continues to have a hard time making definitive decision regarding goals of care but did agree to DNR.  No current indication for CRRT.  We will continue to monitor.  Discussion with family as below.  Unsure whether we would start back renal replacement therapy when the time comes   2 Post biopsy left kidney hematoma: 8 x 4 x 12 cm subcapsular hematoma along the left kidney leading to mass-effect on the renal parenchyma.  IR was consulted, no plan for embolization.  Continue to monitor CBC and imaging studies. Stable now.   3 Cardiac arrest 9/16-asystole, transferred from [facility] post arrest w/ brief course of CPR, 1 round of epi   4 Hyponatremia, mild at  133.  Continue to monitor   6 Metabolic acidosis: Resolved at this time.  Continue to monitor   7 shock: Likely multifactorial.  Continue pressors per primary team   8 Anemia of CKD and blood loss postbiopsy: Monitor CBC and transfuse as needed.   9.  Ventilator dependent respiratory failure.  With intubation per critical care service  10.  Hyperkalemia: 5.4 today.  Lokelma 10 g.  Continue to monitor on labs  Subjective:   Now off of CRRT with persistent severe hypotension with volume overload.  No signs of renal recovery with no urine output documented.    I called and discussed care with the patient's sister as well as her husband.  We discussed that the patient remains severely ill.  Currently no acute indication for renal replacement therapy but it is likely inevitable.  We discussed that renal replacement therapy would not improve the patient's overall clinical picture but only prolong current states or possibly by some time.  Her prognosis is poor currently.   Objective:   BP (!) 140/48   Pulse 85   Temp (!) 96.6 F (35.9 C) (Rectal)   Resp 20   Ht '5\' 4"'  (9.629 m)   Wt 78.9 kg   SpO2 100%   BMI 29.86 kg/m   Intake/Output Summary (Last 24 hours) at 10/28/2020 1031 Last data filed at 10/28/2020 0700 Gross per 24 hour  Intake 2868.24 ml  Output 484 ml  Net 2384.24 ml   Weight change: 2.2 kg  Physical Exam: General:intubated, sedation weaned down this am Heart:RRR Lungs:intubated, bl chest expansion, no crackles Abdomen:soft,  Non-tender, non-distended Extremities: Bilateral ankle edema present+, RIJ temp Neuro: sedated   Imaging: DG Chest Port 1 View  Result Date: 10/28/2020 CLINICAL DATA:  Acute respiratory failure with hypoxia EXAM: PORTABLE CHEST 1 VIEW COMPARISON:  10/26/2020 FINDINGS: Support devices are stable. Heart is normal size. Bilateral lower lobe airspace opacities, increasing in the right base since prior study, stable on the left. No visible effusions  or pneumothorax. No acute bony abnormality. IMPRESSION: Bilateral lower lobe opacities, increasing on the right and stable on the left. Electronically Signed   By: Rolm Baptise M.D.   On: 10/28/2020 07:00    Labs: BMET Recent Labs  Lab 10/23/20 1525 10/24/20 0427 10/24/20 1713 10/25/20 0203 10/25/20 1417 10/26/20 0310 10/26/20 1532 10/27/20 0329 10/27/20 2235  NA 135 136 136 134* 131* 133* 132* 134* 133*  K 5.2* 5.1 6.5* 5.8* 5.0 3.6 3.9 4.9 5.4*  CL 102 100 105 98 98 98 97* 102  --   CO2 '27 28 22 25 ' 21* '25 27 23  ' --   GLUCOSE 220* 246* 234* 278* 301* 227* 324* 243*  --   BUN 24* 23 30* 29* 23 25* 24* 25*  --   CREATININE 1.40* 1.16* 1.51* 1.33* 1.08* 1.17* 1.24* 1.22*  --   CALCIUM 8.4* 8.7* 8.6* 8.6* 8.5* 8.8* 8.6* 8.9  --   PHOS 2.4* 2.3* 3.3 3.4 2.8  --  2.5 2.6  --    CBC Recent Labs  Lab 10/21/20 1305 10/21/20 1314 10/24/20 0427 10/25/20 0203 10/26/20 0310 10/27/20 0329 10/27/20 2235  WBC 30.4*   < > 25.6* 31.0* 27.3* 24.6*  --   NEUTROABS 24.6*  --   --   --  20.9*  --   --   HGB 7.4*   < > 9.2* 8.9* 8.4* 8.2* 9.2*  HCT 25.4*   < > 30.4* 30.2* 28.6* 28.8* 27.0*  MCV 98.8   < > 93.8 95.9 96.6 99.0  --   PLT 361   < > 230 221 171 168  --    < > = values in this interval not displayed.    Medications:     albuterol  2.5 mg Nebulization BID   B-complex with vitamin C  1 tablet Per Tube Daily   calcitRIOL  0.25 mcg Per Tube Daily   chlorhexidine gluconate (MEDLINE KIT)  15 mL Mouth Rinse BID   Chlorhexidine Gluconate Cloth  6 each Topical Daily   docusate  100 mg Per Tube BID   ezetimibe  10 mg Per Tube Daily   febuxostat  40 mg Per NG tube Daily   feeding supplement (PROSource TF)  90 mL Per Tube TID   heparin injection (subcutaneous)  5,000 Units Subcutaneous Q8H   influenza vaccine adjuvanted  0.5 mL Intramuscular Tomorrow-1000   insulin aspart  0-20 Units Subcutaneous Q4H   insulin aspart  12 Units Intravenous Q4H   insulin glargine-yfgn  45 Units  Subcutaneous BID   mouth rinse  15 mL Mouth Rinse 10 times per day   pantoprazole sodium  40 mg Per Tube Daily   polyethylene glycol  17 g Per Tube Daily   pravastatin  20 mg Per NG tube QAC breakfast   prednisoLONE acetate  1 drop Left Eye BID   sodium chloride flush  10-40 mL Intracatheter Q12H   vancomycin variable dose per unstable renal function (pharmacist dosing)   Does not apply See admin instructions      Reesa Chew  10/28/2020,  10:31 AM

## 2020-10-29 ENCOUNTER — Inpatient Hospital Stay (HOSPITAL_COMMUNITY): Payer: HMO

## 2020-10-29 DIAGNOSIS — Z7189 Other specified counseling: Secondary | ICD-10-CM

## 2020-10-29 LAB — CBC
HCT: 27.9 % — ABNORMAL LOW (ref 36.0–46.0)
Hemoglobin: 8.1 g/dL — ABNORMAL LOW (ref 12.0–15.0)
MCH: 29 pg (ref 26.0–34.0)
MCHC: 29 g/dL — ABNORMAL LOW (ref 30.0–36.0)
MCV: 100 fL (ref 80.0–100.0)
Platelets: 141 10*3/uL — ABNORMAL LOW (ref 150–400)
RBC: 2.79 MIL/uL — ABNORMAL LOW (ref 3.87–5.11)
RDW: 23.8 % — ABNORMAL HIGH (ref 11.5–15.5)
WBC: 21 10*3/uL — ABNORMAL HIGH (ref 4.0–10.5)
nRBC: 6 % — ABNORMAL HIGH (ref 0.0–0.2)

## 2020-10-29 LAB — GLUCOSE, CAPILLARY
Glucose-Capillary: 132 mg/dL — ABNORMAL HIGH (ref 70–99)
Glucose-Capillary: 150 mg/dL — ABNORMAL HIGH (ref 70–99)
Glucose-Capillary: 151 mg/dL — ABNORMAL HIGH (ref 70–99)
Glucose-Capillary: 154 mg/dL — ABNORMAL HIGH (ref 70–99)
Glucose-Capillary: 162 mg/dL — ABNORMAL HIGH (ref 70–99)
Glucose-Capillary: 182 mg/dL — ABNORMAL HIGH (ref 70–99)
Glucose-Capillary: 194 mg/dL — ABNORMAL HIGH (ref 70–99)
Glucose-Capillary: 227 mg/dL — ABNORMAL HIGH (ref 70–99)
Glucose-Capillary: 236 mg/dL — ABNORMAL HIGH (ref 70–99)
Glucose-Capillary: 240 mg/dL — ABNORMAL HIGH (ref 70–99)
Glucose-Capillary: 243 mg/dL — ABNORMAL HIGH (ref 70–99)
Glucose-Capillary: 245 mg/dL — ABNORMAL HIGH (ref 70–99)
Glucose-Capillary: 338 mg/dL — ABNORMAL HIGH (ref 70–99)
Glucose-Capillary: 352 mg/dL — ABNORMAL HIGH (ref 70–99)

## 2020-10-29 LAB — COMPREHENSIVE METABOLIC PANEL
ALT: 56 U/L — ABNORMAL HIGH (ref 0–44)
AST: 59 U/L — ABNORMAL HIGH (ref 15–41)
Albumin: 2.4 g/dL — ABNORMAL LOW (ref 3.5–5.0)
Alkaline Phosphatase: 169 U/L — ABNORMAL HIGH (ref 38–126)
Anion gap: 12 (ref 5–15)
BUN: 94 mg/dL — ABNORMAL HIGH (ref 8–23)
CO2: 22 mmol/L (ref 22–32)
Calcium: 9.5 mg/dL (ref 8.9–10.3)
Chloride: 100 mmol/L (ref 98–111)
Creatinine, Ser: 4.24 mg/dL — ABNORMAL HIGH (ref 0.44–1.00)
GFR, Estimated: 11 mL/min — ABNORMAL LOW (ref >60)
Glucose, Bld: 258 mg/dL — ABNORMAL HIGH (ref 70–99)
Potassium: 5.2 mmol/L — ABNORMAL HIGH (ref 3.5–5.1)
Sodium: 134 mmol/L — ABNORMAL LOW (ref 135–145)
Total Bilirubin: 1.1 mg/dL (ref 0.3–1.2)
Total Protein: 6.3 g/dL — ABNORMAL LOW (ref 6.5–8.1)

## 2020-10-29 LAB — CULTURE, BLOOD (ROUTINE X 2): Culture: NO GROWTH

## 2020-10-29 MED ORDER — INSULIN ASPART 100 UNIT/ML IJ SOLN
2.0000 [IU] | INTRAMUSCULAR | Status: DC
  Administered 2020-10-29: 4 [IU] via SUBCUTANEOUS
  Administered 2020-10-29: 6 [IU] via SUBCUTANEOUS

## 2020-10-29 MED ORDER — GERHARDT'S BUTT CREAM
TOPICAL_CREAM | Freq: Two times a day (BID) | CUTANEOUS | Status: DC
  Filled 2020-10-29: qty 1

## 2020-10-29 MED ORDER — LEVETIRACETAM 100 MG/ML PO SOLN
500.0000 mg | Freq: Two times a day (BID) | ORAL | Status: DC
  Administered 2020-10-29 – 2020-10-30 (×2): 500 mg
  Filled 2020-10-29 (×2): qty 5

## 2020-10-29 MED ORDER — INSULIN DETEMIR 100 UNIT/ML ~~LOC~~ SOLN
24.0000 [IU] | Freq: Two times a day (BID) | SUBCUTANEOUS | Status: DC
  Administered 2020-10-29 – 2020-10-30 (×3): 24 [IU] via SUBCUTANEOUS
  Filled 2020-10-29 (×4): qty 0.24

## 2020-10-29 MED ORDER — INSULIN ASPART 100 UNIT/ML IJ SOLN
0.0000 [IU] | INTRAMUSCULAR | Status: DC
  Administered 2020-10-29: 15 [IU] via SUBCUTANEOUS
  Administered 2020-10-29: 20 [IU] via SUBCUTANEOUS
  Administered 2020-10-30 (×2): 7 [IU] via SUBCUTANEOUS

## 2020-10-29 MED ORDER — SODIUM ZIRCONIUM CYCLOSILICATE 5 G PO PACK
5.0000 g | PACK | Freq: Once | ORAL | Status: AC
  Administered 2020-10-29: 5 g
  Filled 2020-10-29 (×2): qty 1

## 2020-10-29 NOTE — Progress Notes (Signed)
[facility] Progress Note   70 year old lady with stage IV chronic kidney disease diabetes mellitus type 2 hyperlipidemia hypertrophic cardiomyopathy presents to the emergency department left lower quadrant pain status postbiopsy 10/07/2020.  Was found to have 8 x 4 x 12 cm subcapsular hematoma.  Late evening 10/14/2020.  With cardiac arrest received 1 mg of epi with ACLS/CPR with ROSC achieved.  Concerns for pulmonary embolus.  Heparin initiated.  Transferred from [facility] to [facility].     Urine output minimal underwent dialysis 10/19/2020 with 1.5 L, 10/20/2021 with 2.5 L removed Status post respiratory arrest.  Requiring intubation.  Patient started on CRRT 10/21/2020  Assessment/ Plan:   1.Acute kidney injury on CKD stage IV: Patient has longstanding CKD with baseline creatinine level seems to be around 2.6-2.9 however recently the GFR declined and creatinine level around 3.6 as outpatient.  Reportedly the biopsy result came back hypertensive and diabetic changes with severe interstitial fibrosis and tubular atrophy with mostly chronic changes.  - Initiated   HD 10/19/2020  and 10/20/2021 before transitioning to  CRRT 10/21/2020-10/27/20 -CRRT stopped on 9/29 due to worsening hypotension and overall poor clinical picture.  Family continues to have a hard time making definitive decision regarding goals of care but did agree to DNR.  No current indication for CRRT but this is likely to develop in the next 24 to 48 hours.  Unsure whether we would start back renal replacement therapy when the time comes.  There is a family meeting on Monday and hopefully we can delay restarting renal replacement therapy until then.   2 Post biopsy left kidney hematoma: 8 x 4 x 12 cm subcapsular hematoma along the left kidney leading to mass-effect on the renal parenchyma.  IR was consulted, no plan for embolization.  Continue to monitor CBC and imaging studies. Stable now.   3 Cardiac arrest  9/16-asystole, transferred from [facility] post arrest w/ brief course of CPR, 1 round of epi   4 Hyponatremia, mild at 133.  Continue to monitor   6 Metabolic acidosis: Resolved at this time.  Continue to monitor   7 shock: Likely multifactorial.  Continue pressors per primary team   8 Anemia of CKD and blood loss postbiopsy: Monitor CBC and transfuse as needed.   9.  Ventilator dependent respiratory failure.  With intubation per critical care service  10.  Hyperkalemia: Minimal at this time.  Use Lokelma as needed  Subjective:   Persistent instability.  No fevers.  Continues to require pressors.  Labs worsening consistent with renal failure.   Objective:   BP (!) 132/41   Pulse 86   Temp 98 F (36.7 C) (Rectal)   Resp 19   Ht _0  (1.626 m)   Wt 82.6 kg   SpO2 100%   BMI 31.26 kg/m   Intake/Output Summary (Last 24 hours) at 10/29/2020 1025 Last data filed at 10/29/2020 0800 Gross per 24 hour  Intake 2909.02 ml  Output 0 ml  Net 2909.02 ml   Weight change: 3.7 kg  Physical Exam: General:intubated, sedation weaned down this am Heart:RRR Lungs:intubated, bl chest expansion, no crackles Abdomen:soft, Non-tender, non-distended Extremities: Bilateral ankle edema present+, RIJ temp Neuro: sedated   Imaging: DG Chest Port 1 View  Result Date: 10/29/2020 CLINICAL DATA:  Respiratory failure.  Hypoxia EXAM: PORTABLE CHEST 1 VIEW COMPARISON:  October 28, 2020 FINDINGS: The ET tube and right central line are stable. The NG tube terminates below today's film. No pneumothorax. The  cardiomediastinal silhouette is stable. Opacity in the right base is stable. Increased opacity in the left mid lower lung. No other interval changes. IMPRESSION: 1. Support apparatus as above. 2. Stable opacity in the right base. 3. New opacity in the left mid and lower lung could represent layering effusion with underlying atelectasis or developing infiltrate. Recommend attention on follow-up. Electronically  Signed   By: Dorise Bullion III M.D.   On: 10/29/2020 06:37   DG Chest Port 1 View  Result Date: 10/28/2020 CLINICAL DATA:  Acute respiratory failure with hypoxia EXAM: PORTABLE CHEST 1 VIEW COMPARISON:  10/26/2020 FINDINGS: Support devices are stable. Heart is normal size. Bilateral lower lobe airspace opacities, increasing in the right base since prior study, stable on the left. No visible effusions or pneumothorax. No acute bony abnormality. IMPRESSION: Bilateral lower lobe opacities, increasing on the right and stable on the left. Electronically Signed   By: Rolm Baptise M.D.   On: 10/28/2020 07:00    Labs: BMET Recent Labs  Lab 10/23/20 1525 10/24/20 0427 10/24/20 1713 10/25/20 0203 10/25/20 1417 10/26/20 0310 10/26/20 1532 10/27/20 0329 10/27/20 2235 10/28/20 0804 10/29/20 0241  NA 135 136 136 134* 131* 133* 132* 134* 133* 134* 134*  K 5.2* 5.1 6.5* 5.8* 5.0 3.6 3.9 4.9 5.4* 4.7 5.2*  CL 102 100 105 98 98 98 97* 102  --  100 100  CO2 _0 21* _1 --  21* 22  GLUCOSE 220* 246* 234* 278* 301* 227* 324* 243*  --  261* 258*  BUN 24* 23 30* 29* 23 25* 24* 25*  --  71* 94*  CREATININE 1.40* 1.16* 1.51* 1.33* 1.08* 1.17* 1.24* 1.22*  --  3.36* 4.24*  CALCIUM 8.4* 8.7* 8.6* 8.6* 8.5* 8.8* 8.6* 8.9  --  9.3 9.5  PHOS 2.4* 2.3* 3.3 3.4 2.8  --  2.5 2.6  --   --   --    CBC Recent Labs  Lab 10/25/20 0203 10/26/20 0310 10/27/20 0329 10/27/20 2235 10/29/20 0241  WBC 31.0* 27.3* 24.6*  --  21.0*  NEUTROABS  --  20.9*  --   --   --   HGB 8.9* 8.4* 8.2* 9.2* 8.1*  HCT 30.2* 28.6* 28.8* 27.0* 27.9*  MCV 95.9 96.6 99.0  --  100.0  PLT 221 171 168  --  141*    Medications:     B-complex with vitamin C  1 tablet Per Tube Daily   calcitRIOL  0.25 mcg Per Tube Daily   chlorhexidine gluconate (MEDLINE KIT)  15 mL Mouth Rinse BID   Chlorhexidine Gluconate Cloth  6 each Topical Daily   docusate  100 mg Per Tube BID   ezetimibe  10 mg Per Tube Daily   febuxostat  40  mg Per NG tube Daily   feeding supplement (PROSource TF)  90 mL Per Tube TID   Gerhardt's butt cream   Topical BID   heparin injection (subcutaneous)  5,000 Units Subcutaneous Q8H   insulin aspart  2-6 Units Subcutaneous Q4H   insulin detemir  24 Units Subcutaneous Q12H   levETIRAcetam  500 mg Per Tube BID   mouth rinse  15 mL Mouth Rinse 10 times per day   pantoprazole sodium  40 mg Per Tube Daily   polyethylene glycol  17 g Per Tube Daily   pravastatin  20 mg Per NG tube QAC breakfast   prednisoLONE acetate  1 drop Left Eye BID   sodium  chloride flush  10-40 mL Intracatheter Q12H      Reesa Chew  10/29/2020, 10:25 AM

## 2020-10-29 NOTE — Progress Notes (Signed)
eLink Physician-Brief Progress Note Patient Name: Krista Strickland DOB: Jun 05, 1950 MRN: [id number]   Date of Service  10/29/2020  HPI/Events of Note  Nursing request for Gerhardt's Butt Cream.  eICU Interventions  Will order Gerhardt's Butt Cream now and BID.     Intervention Category Major Interventions: Other:  Rhayne Chatwin Cornelia Copa 10/29/2020, 4:01 AM

## 2020-10-29 NOTE — Progress Notes (Signed)
NAME:  Krista Strickland, MRN:  [id number], DOB:  02/11/1950, LOS: 33 ADMISSION DATE:  10/24/2020, CONSULTATION DATE: 10/15/2020 REFERRING MD: Dr. Roger Shelter, CHIEF COMPLAINT: Cardiac arrest  History of Present Illness:  70 yo female presented to [facility] ER with LLQ abdominal pain.  She had renal biopsy on 10/07/20.  She was found to have acute 12 x 8 x 4 cm Lt renal subcapsular hematoma on CT imaging.  IR consulted and advised for clinical monitoring.  On 9/16 she developed dyspnea and then bradycardia.  This lead to asystolic cardiac arrest.  She had ROSC after about 5 minutes.  She required intubation.  She was started empirically on heparin gtt with concern for acute PE as cause of cardiac arrest, but this was stopped due to drop in hemoglobin.  She was transferred to [facility] for further management.  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 Patient was re-intubated. Right IJ Vas-cath placed. Left IJ vas-cath removed. 9/28: increasing levo requirements 9/29: family discussion; decided to make DNR. Does not believe patient would want trach and be in long term facility. Needs a few more days to decide about one-way extubation/comfort measures. Levo maxed at 40; added vaso. CRRT turned off per nephro  Interim History / Subjective:  Remains on pressors.  Tolerating some pressure support.  No urine outpt.  Objective   Blood pressure (!) 132/41, pulse 86,  temperature 98 F (36.7 C), temperature source Rectal, resp. rate 19, height 5\' 4"  (1.626 m), weight 82.6 kg, SpO2 100 %.    Vent Mode: PSV;CPAP FiO2 (%):  [30 %] 30 % Set Rate:  [20 bmp] 20 bmp Vt Set:  [440 mL] 440 mL PEEP:  [5 cmH20] 5 cmH20 Pressure Support:  [13 cmH20] 13 cmH20 Plateau Pressure:  [14 cmH20-16 cmH20] 16 cmH20   Intake/Output Summary (Last 24 hours) at 10/29/2020 0815 Last data filed at 10/29/2020 0800 Gross per 24 hour  Intake 3009.02 ml  Output 0 ml  Net 3009.02 ml   Filed Weights   10/27/20 0400 10/28/20 0500 10/29/20 0500  Weight: 76.7 kg 78.9 kg 82.6 kg    Examination:  General - unresponsive Eyes - cataract noted ENT - ETT in place Cardiac - regular rate/rhythm, no murmur Chest - equal breath sounds b/l, no wheezing or rales Abdomen - soft, non tender, + bowel sounds Extremities - 1+ edema Skin - no rashes Neuro - grimaces with stimulation, not following commands   Resolved Hospital Problem list   Elevated troponin from demand ischemia, Hyponatremia, Anion gap metabolic acidosis with lactic acidosis  Assessment & Plan:   Acute hypoxic/hypercapnic respiratory failure in setting of PEA cardiac arrest with aspiration pneumonitis. - failed extubation attempt on 9/23 - mental status precludes additional extubation attempts at this time - family deciding about trach/peg and long term support versus comfort measures - f/u CXR intermittently - can wean of pressure support as tolerated - goal SpO2 > 92%  AKI from ATN in setting of cardiogenic/septic shock. Hyperkalemia.  CKD 5. - transitioned off CRRT 9/29 - not making urine and creatinine trending up - don't think she is a suitable candidate for long term dialysis - nephrology following  Septic shock from pneumonia. - day 6/7 of current round of ABx - can d/c vancomycin - Staph epi in blood culture from 9/26 likely contaminate and doesn't require treatment - wean pressors to keep MAP >  60  Acute metabolic encephalopathy from anoxia 2nd to cardiac arrest, sepsis, and renal failure. - RASS goal 0 - CT head from 9/23 was negative for acute findings - no significant improvement in mental status since cardiac arrest event  - continue keppra  DM type 2 poorly controlled with hyperglycemia. - transition off insulin gtt 10/1  Lt renal subcapsular hematoma after renal biopsy. Anemia of critical illness and chronic disease. - f/u CBC  Moderate protein calorie malnutrition. - continue tube feeds  Hx of HTN, HCOM, HLD. - continue zetia, pravachol  Goals of care. - DNR - palliative care consulted - family conflicted about whether to continue support care versus transitioning to comfort measures - family meeting tentatively planned for beginning of next week  Best Practice    Diet/type: tubefeeds DVT prophylaxis: SQ heparin GI prophylaxis: Protonix Lines: Rt IJ HD catheter Foley:  N/A Code Status:  DNR  Labs:   CMP Latest Ref Rng & Units 10/29/2020 10/28/2020 10/27/2020  Glucose 70 - 99 mg/dL 258(H) 261(H) -  BUN 8 - 23 mg/dL 94(H) 71(H) -  Creatinine 0.44 - 1.00 mg/dL 4.24(H) 3.36(H) -  Sodium 135 - 145 mmol/L 134(L) 134(L) 133(L)  Potassium 3.5 - 5.1 mmol/L 5.2(H) 4.7 5.4(H)  Chloride 98 - 111 mmol/L 100 100 -  CO2 22 - 32 mmol/L 22 21(L) -  Calcium 8.9 - 10.3 mg/dL 9.5 9.3 -  Total Protein 6.5 - 8.1 g/dL 6.3(L) 5.9(L) -  Total Bilirubin 0.3 - 1.2 mg/dL 1.1 0.7 -  Alkaline Phos 38 - 126 U/L 169(H) 160(H) -  AST 15 - 41 U/L 59(H) 86(H) -  ALT 0 - 44 U/L 56(H) 63(H) -    CBC Latest Ref Rng & Units 10/29/2020 10/27/2020 10/27/2020  WBC 4.0 - 10.5 K/uL 21.0(H) - 24.6(H)  Hemoglobin 12.0 - 15.0 g/dL 8.1(L) 9.2(L) 8.2(L)  Hematocrit 36.0 - 46.0 % 27.9(L) 27.0(L) 28.8(L)  Platelets 150 - 400 K/uL 141(L) - 168    ABG    Component Value Date/Time   PHART 7.361 10/27/2020 2235   PCO2ART 43.7 10/27/2020 2235   PO2ART 126 (H) 10/27/2020 2235   HCO3 24.1  10/27/2020 2235   TCO2 25 10/27/2020 2235   ACIDBASEDEF 1.0 10/27/2020 2235   O2SAT 98.0 10/27/2020 2235    CBG (last 3)  Recent Labs    10/29/20 0527 10/29/20 0636 10/29/20 0743  GLUCAP 150* 151* 162*    Critical care time: 35 minutes  [name], MD [place] Pager - [phone] 10/29/2020, 8:37 AM

## 2020-10-29 NOTE — Progress Notes (Signed)
Daily Progress Note   Patient Name: Krista Strickland       Date: 10/29/2020 DOB: Jun 06, 1950  Age: 70 y.o. MRN#: [id number] Attending Physician: [name], MD Primary Care Physician: Fayrene Helper, MD Admit Date: 10/25/2020  Reason for Consultation/Follow-up: Establishing goals of care "poor prognosis, multi organ failure, trying to decide between trach and one way extubation"  Subjective:  16:54 - I went to see patient at bedside. Remains off CRRT. Creatinine up to 4.24. Currently on vasopressin and levophed at 20 mcg.   19:30 - I spoke with patient's husband Krista Strickland by phone. I introduced myself and the role of Palliative Medicine as an extra layer of support for the family. I asked if we could arrange another family meeting to discuss [place]. Krista Strickland states "there is no need to meet" and "the family is still discussing". He also states they are "trying to accept God's will". Emotional support provided. I attempted to discuss outcomes from the last meeting and confirm that patient likely would not want a trach, permanent feeding tube, and to live in a facility. Krista Strickland states "we don't need to discuss that right now". I acknowledged that this is a very difficult situation; however, the time was approaching that a decision would need to be reached. Krista Strickland states "I know this", but is unwilling to engage in further conversation.    Length of Stay: 17   Physical Exam Vitals reviewed.  Constitutional:      General: She is in acute distress.     Appearance: She is ill-appearing.     Comments: sedated  Cardiovascular:     Rate and Rhythm: Normal rate.  Pulmonary:     Comments: intubated           Vital Signs: BP (!) 140/41   Pulse 87   Temp 99 F (37.2 C) (Rectal) Comment (Src):  cooling blanket turned off  Resp (!) 24   Ht 5\' 4"  (1.626 m)   Wt 82.6 kg   SpO2 100%   BMI 31.26 kg/m  SpO2: SpO2: 100 % O2 Device: O2 Device: Ventilator     Palliative Assessment/Data: PPS 30%      Palliative Care Assessment & Plan   HPI/Patient Profile: 70 y.o. female  with past medical history of CKD stage V, hypertension, type 2 diabetes, GERD,  hyperlipidemia, and hypertrophic cardiomyopathy who presented to [facility] emergency department on 10/10/2020 with left lower quadrant abdominal pain. She had recently underwent liver biopsy on 10/07/20. CT abdomen/pelvis revealed a subscapular left renal hematoma for which IR recommended monitoring. Late evening of 9/16, patient went into cardiac arrest likely due to aspiration. She received 1 round epi and CPR with ROSC and spontaneous respirations but due to poor mentation she was subsequently intubated.  9/17 - PCCM consulted and patient transferred to [facility] 9/20 - HD initiated  9/23 - patient failed extubation due to respiratory distress and cardiac arrest.   Assessment: - acute hypoxic /hypercapnic respiratory failure in setting of cardiac arrest - aspiration pneumonitis - AKI on CKD stage V - cardiogenic shock - septic shock from pneumonia - left renal subscapular hematoma - acute metabolic encephalopathy   Recommendations/Plan: Continue current interventions Husband was unwilling to schedule a family meeting at this time PMT will continue to follow and is available to support as needed  Goals of Care and Additional Recommendations: Limitations on Scope of Treatment: Full Scope Treatment  Code Status: DNR/DNI   Prognosis:  Poor in the setting of cardiogenic/septic shock and multi-system organ failure   Thank you for allowing the Palliative Medicine Team to assist in the care of this patient.   Total Time 25 minutes Prolonged Time Billed  no       Greater than 50%  of this time was spent counseling and coordinating care  related to the above assessment and plan.  Lavena Bullion, NP  Please contact Palliative Medicine Team phone at [phone] for questions and concerns.

## 2020-10-29 NOTE — Progress Notes (Signed)
Patient Krista Strickland notified Keppra additional dose of 500 exceeded normal limit in 12 hour period. I called pharmacist Charleston Ropes and verified that increased dose was appropriate for patient.  Medication was administered.

## 2020-10-29 NOTE — Progress Notes (Signed)
Pt placed back on full vent support due to tachycardia, increased agitation, and increased WOB. Pt tolerating well at this time, RN aware, RT will continue to monitor.

## 2020-10-29 NOTE — Progress Notes (Signed)
eLink Physician-Brief Progress Note Patient Name: Krista Strickland DOB: Nov 10, 1950 MRN: [id number]   Date of Service  10/29/2020  HPI/Events of Note  Was on Insulin drip which was D/C at 11:33 today, received Levemir 24 units at 0935 and standard SSI was started without TF coverage, now CBG is 352, RN asking for change in SSI and TF coverage   on vent with with plans for family mtg on Monday for [place]  eICU Interventions  Another dose of levemir 24 units due tonight. Increased SSI to resistant scale.     Intervention Category Intermediate Interventions: Hyperglycemia - evaluation and treatment  Shona Needles Bettyjo Lundblad 10/29/2020, 8:16 PM

## 2020-10-29 DEATH — deceased

## 2020-10-30 LAB — RENAL FUNCTION PANEL
Albumin: 2 g/dL — ABNORMAL LOW (ref 3.5–5.0)
Anion gap: 15 (ref 5–15)
BUN: 128 mg/dL — ABNORMAL HIGH (ref 8–23)
CO2: 17 mmol/L — ABNORMAL LOW (ref 22–32)
Calcium: 8.7 mg/dL — ABNORMAL LOW (ref 8.9–10.3)
Chloride: 103 mmol/L (ref 98–111)
Creatinine, Ser: 4.96 mg/dL — ABNORMAL HIGH (ref 0.44–1.00)
GFR, Estimated: 9 mL/min — ABNORMAL LOW (ref >60)
Glucose, Bld: 307 mg/dL — ABNORMAL HIGH (ref 70–99)
Phosphorus: 8.2 mg/dL — ABNORMAL HIGH (ref 2.5–4.6)
Potassium: 5.6 mmol/L — ABNORMAL HIGH (ref 3.5–5.1)
Sodium: 135 mmol/L (ref 135–145)

## 2020-10-30 LAB — CBC
HCT: 23.9 % — ABNORMAL LOW (ref 36.0–46.0)
Hemoglobin: 6.9 g/dL — CL (ref 12.0–15.0)
MCH: 28.9 pg (ref 26.0–34.0)
MCHC: 28.9 g/dL — ABNORMAL LOW (ref 30.0–36.0)
MCV: 100 fL (ref 80.0–100.0)
Platelets: 132 10*3/uL — ABNORMAL LOW (ref 150–400)
RBC: 2.39 MIL/uL — ABNORMAL LOW (ref 3.87–5.11)
RDW: 24.5 % — ABNORMAL HIGH (ref 11.5–15.5)
WBC: 17 10*3/uL — ABNORMAL HIGH (ref 4.0–10.5)
nRBC: 7.1 % — ABNORMAL HIGH (ref 0.0–0.2)

## 2020-10-30 LAB — GLUCOSE, CAPILLARY
Glucose-Capillary: 249 mg/dL — ABNORMAL HIGH (ref 70–99)
Glucose-Capillary: 249 mg/dL — ABNORMAL HIGH (ref 70–99)

## 2020-10-30 LAB — POTASSIUM: Potassium: 5.6 mmol/L — ABNORMAL HIGH (ref 3.5–5.1)

## 2020-10-30 LAB — PREPARE RBC (CROSSMATCH)

## 2020-10-30 MED ORDER — ACETAMINOPHEN 325 MG PO TABS: 650.0000 mg | ORAL_TABLET | Freq: Four times a day (QID) | ORAL | Status: DC | PRN

## 2020-10-30 MED ORDER — GLYCOPYRROLATE 0.2 MG/ML IJ SOLN: 0.2000 mg | INTRAMUSCULAR | Status: DC | PRN

## 2020-10-30 MED ORDER — PRISMASOL BGK 0/2.5 32-2.5 MEQ/L EC SOLN
Status: DC
  Filled 2020-10-30 (×6): qty 5000

## 2020-10-30 MED ORDER — DIPHENHYDRAMINE HCL 50 MG/ML IJ SOLN: 25.0000 mg | INTRAMUSCULAR | Status: DC | PRN

## 2020-10-30 MED ORDER — HEPARIN SODIUM (PORCINE) 1000 UNIT/ML DIALYSIS
1000.0000 [IU] | INTRAMUSCULAR | Status: DC | PRN
  Filled 2020-10-30: qty 3

## 2020-10-30 MED ORDER — LORAZEPAM 2 MG/ML IJ SOLN
INTRAMUSCULAR | Status: AC
  Filled 2020-10-30: qty 1

## 2020-10-30 MED ORDER — POLYVINYL ALCOHOL 1.4 % OP SOLN
1.0000 [drp] | Freq: Four times a day (QID) | OPHTHALMIC | Status: DC | PRN
  Filled 2020-10-30: qty 15

## 2020-10-30 MED ORDER — SODIUM CHLORIDE 0.9% IV SOLUTION: Freq: Once | INTRAVENOUS | Status: DC

## 2020-10-30 MED ORDER — SODIUM CHLORIDE 0.9 % FOR CRRT: 500.0000 mL | INTRAVENOUS_CENTRAL | Status: DC | PRN

## 2020-10-30 MED ORDER — GLYCOPYRROLATE 1 MG PO TABS: 1.0000 mg | ORAL_TABLET | ORAL | Status: DC | PRN

## 2020-10-30 MED ORDER — PRISMASOL BGK 0/2.5 32-2.5 MEQ/L EC SOLN
Status: DC
  Filled 2020-10-30: qty 5000

## 2020-10-30 MED ORDER — LORAZEPAM 2 MG/ML IJ SOLN
2.0000 mg | INTRAMUSCULAR | Status: DC | PRN
  Administered 2020-10-30: 2 mg via INTRAVENOUS

## 2020-10-30 MED ORDER — DEXTROSE 5 % IV SOLN: INTRAVENOUS | Status: DC

## 2020-10-30 MED ORDER — ACETAMINOPHEN 650 MG RE SUPP: 650.0000 mg | Freq: Four times a day (QID) | RECTAL | Status: DC | PRN

## 2020-10-30 MED ORDER — PRISMASOL BGK 0/2.5 32-2.5 MEQ/L EC SOLN
Status: DC
  Filled 2020-10-30 (×2): qty 5000

## 2020-10-30 MED ORDER — SODIUM ZIRCONIUM CYCLOSILICATE 10 G PO PACK
10.0000 g | PACK | Freq: Once | ORAL | Status: AC
  Administered 2020-10-30: 10 g
  Filled 2020-10-30: qty 1

## 2020-10-30 MED ORDER — FENTANYL CITRATE (PF) 100 MCG/2ML IJ SOLN
25.0000 ug | INTRAMUSCULAR | Status: DC | PRN
  Administered 2020-10-30: 100 ug via INTRAVENOUS
  Filled 2020-10-30: qty 2

## 2020-10-31 LAB — TYPE AND SCREEN
ABO/RH(D): A POS
Antibody Screen: NEGATIVE
Unit division: 0

## 2020-10-31 LAB — BPAM RBC
Blood Product Expiration Date: 202210302359
ISSUE DATE / TIME: 202210020829
Unit Type and Rh: 6200

## 2020-11-29 NOTE — Death Summary Note (Signed)
Krista Strickland was  70 y.o. female presented to [facility] ER on 10/06/2020 with LLQ abdominal pain.  She had renal biopsy on 10/07/20.  She was found to have acute 12 x 8 x 4 cm Lt renal subcapsular hematoma on CT imaging.  IR consulted and advised for clinical monitoring.  On 9/16 she developed dyspnea and then bradycardia.  This lead to asystolic cardiac arrest.  She had ROSC after about 5 minutes.  She required intubation.  She was started empirically on heparin gtt with concern for acute PE as cause of cardiac arrest, but this was stopped due to drop in hemoglobin.  She was transferred to [facility] for further management.  She developed progressive renal failure and seen by nephrology.  She was started on CRRT.  She was extubated on 9/23, but then developed cardiac arrest and reintubated.  She developed shock and fever.  Started on pressors, and antibiotics for aspiration pneumonia.  Family opted for DNR status.  She had fluctuating blood sugars and transiently needed insulin infusion.  She was tried off CRRT but renal function got worse.  She required PRBC transfusion for anemia.  She developed recurrent cardiac arrest on 11-02-2022.  She received one mg of epi and 1 amp of HCO3 and regained her pulse.  Had further discussion with family about goals of care.  They decided to transition to comfort measures.  She was extubated.  She expired on Nov 01, 2020 at 12:06 pm.  Cause of death: AKI from ATN secondary to cardiogenic and septic shock  Final diagnoses: Acute hypoxic/hypercapnic respiratory failure Aspiration pneumonitis PEA cardiac arrest Hyperkalemia Acute metabolic encephalopathy from anoxia, sepsis, and renal failure Non gap metabolic acidosis CKD 5 DM type 2 poorly controlled with hyperglycemia Moderate protein calorie malnutrition Hx of HCOM, Hypertension, Hyperlipidemia Anemia of critical illness and chronic disease Thrombocytopenia in setting of sepsis Lt renal subcapsular hematoma after renal biopsy Hx  of uterine cancer Blindness Hx of psoriasis  [name], MD [facility] Pulmonary/Critical Care 01-Nov-2020, 12:35 PM

## 2020-11-29 NOTE — Progress Notes (Signed)
Patient had recurrent episode of PEA arrest.  Received HCO3 and regained pulse.  Unfortunately mental status worse again.    Spoke with pt's family at bedside.  Explained best case scenario she would need long term support with trach, vent, peg, and dialysis.  As such she would need to be in a facility for 24 hr care.  They are in agreement that she should not want this quality of life.  They are ready to shift focus of care to maintaining comfort, but stop all other therapies.  DNR confirmed.  Explained process of vent remove, and events to expect until she passes away.  Also explained to the purpose of obtaining renal biopsy in patient with chronic kidney disease and reviewed findings of biopsy with them.  Time spent 32 minutes.  [name], MD [place] Pager - [phone] 11-27-20, 11:34 AM

## 2020-11-29 NOTE — Procedures (Signed)
Extubation Procedure Note  Patient Details:   Name: Krista Strickland DOB: [birth year] MRN: [id number]   Airway Documentation:    Vent end date: Nov 26, 2020 Vent end time: 1150   Evaluation  O2 sats: stable throughout Complications: No apparent complications Patient did tolerate procedure well. Bilateral Breath Sounds: Rhonchi, Diminished   Pt extubated to comfort care MD.   Carren Rang 11-26-2020, 11:51 AM

## 2020-11-29 NOTE — Progress Notes (Addendum)
eLink Physician-Brief Progress Note Patient Name: Krista Strickland DOB: 10-23-1950 MRN: [id number]   Date of Service  11-17-20  HPI/Events of Note  Notified of K 5.6 Creatinine 4.96, nephrology following  eICU Interventions  Ordered Lokelma as per nephrology recommendation for mild hyperkalemia     Intervention Category Intermediate Interventions: Electrolyte abnormality - evaluation and management  Judd Lien November 17, 2020, 4:14 AM

## 2020-11-29 NOTE — Progress Notes (Signed)
NAME:  Krista Strickland, MRN:  [id number], DOB:  06/10/1950, LOS: 76 ADMISSION DATE:  10/18/2020, CONSULTATION DATE: 10/15/2020 REFERRING MD: Dr. Roger Shelter, CHIEF COMPLAINT: Cardiac arrest  History of Present Illness:  70 yo female presented to [facility] ER with LLQ abdominal pain.  She had renal biopsy on 10/07/20.  She was found to have acute 12 x 8 x 4 cm Lt renal subcapsular hematoma on CT imaging.  IR consulted and advised for clinical monitoring.  On 9/16 she developed dyspnea and then bradycardia.  This lead to asystolic cardiac arrest.  She had ROSC after about 5 minutes.  She required intubation.  She was started empirically on heparin gtt with concern for acute PE as cause of cardiac arrest, but this was stopped due to drop in hemoglobin.  She was transferred to [facility] for further management.  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 Patient was re-intubated. Right IJ Vas-cath placed. Left IJ vas-cath removed. 9/28 increasing levo requirements 9/29 family discussion; decided to make DNR. Does not believe patient would want trach and be in long term facility. Needs a few more days to decide about one-way extubation/comfort measures. Levo maxed at 40; added vaso. CRRT turned off per nephro 9/30 start insulin gtt 10/1 off insulin gtt 10/2 Transfuse 1 unit PRBC; mental status improved and following commands  Interim History / Subjective:  To get  1 unit PRBC.  Tolerating pressure support.  Pressor needs improved some.  Following commands.  K and Creatinine up.  Objective   Blood pressure (!) 127/56, pulse 84, temperature (!) 97.4 F (36.3 C), temperature source Rectal, resp. rate (!) 23, height 5\' 4"  (1.626 m), weight 83.1 kg, SpO2 100 %.    Vent Mode: PSV;CPAP FiO2 (%):  [30 %] 30 % Set Rate:  [20 bmp] 20 bmp Vt Set:  [440 mL] 440 mL PEEP:  [5 cmH20] 5 cmH20 Pressure Support:  [13 cmH20] 13 cmH20 Plateau Pressure:  [16 cmH20-17 cmH20] 17 cmH20   Intake/Output Summary (Last 24 hours) at Nov 04, 2020 0813 Last data filed at 11-04-20 0600 Gross per 24 hour  Intake 2155.48 ml  Output --  Net 2155.48 ml   Filed Weights   10/28/20 0500 10/29/20 0500 11-04-20 0352  Weight: 78.9 kg 82.6 kg 83.1 kg    Examination:  General - sedated Eyes - blind in left eye ENT - ETT in place Cardiac - regular rate/rhythm, no murmur Chest - equal breath sounds b/l, no wheezing or rales Abdomen - soft, non tender, + bowel sounds Extremities - 2+ edema Skin - no rashes Neuro - moves extremities on command, nods appropriately to questioning   Resolved Hospital Problem list   Elevated troponin from demand ischemia, Hyponatremia, Anion gap metabolic acidosis with lactic acidosis  Assessment & Plan:   Acute hypoxic/hypercapnic respiratory failure in setting of PEA cardiac arrest with aspiration pneumonitis. - failed extubation attempt on 9/23 - deconditioning precludes repeat extubation trial at this time - if family wishes to continue  aggressive therapy, then she will need tracheostomy to assist with vent weaning - f/u CXR - goal SpO2 > 92% - continue pressure support as tolerated  AKI from ATN in setting of cardiogenic/septic shock. Hyperkalemia. CKD 5. - transitioned off CRRT 9/29; suspect she will need to resume CRRT on 10/2 - nephrology following - improvement in mental status likely will change equation of whether to continue  with dialysis more long term  Septic shock from pneumonia. - day 7 of current round of ABx; d/c after dose on 10/2 - Staph epi in blood culture from 9/26 likely contaminate and doesn't require treatment - wean pressors to keep MAP > 60  Acute metabolic encephalopathy from anoxia 2nd to cardiac arrest, sepsis, and renal failure. - wean presors to keep RASS goal 0 - CT head from 9/23 was negative for acute findings - mental status improved 10/2 and now following commands - continue keppra  DM type 2 poorly controlled with hyperglycemia. - SSI - continue levemir 24 units q12h  Lt renal subcapsular hematoma after renal biopsy. Anemia of critical illness and chronic disease. - transfuse 1 unit PRBC on 10/2 - f/u CBC - goal Hb > 7  Moderate protein calorie malnutrition. - continue tube feeds  Hx of HTN, HCOM, HLD. - continue zetia, pravachol  Goals of care. - DNR - palliative care consulted - family meeting tentatively scheduled for later this week - suspect improvement in mental status will change family decision making regarding goals of care  Best Practice    Diet/type: tubefeeds DVT prophylaxis: SQ heparin GI prophylaxis: Protonix Lines: Rt IJ HD catheter Foley:  N/A Code Status:  DNR  Labs:   CMP Latest Ref Rng & Units 15-Nov-2020 11/15/2020 10/29/2020  Glucose 70 - 99 mg/dL - 307(H) 258(H)  BUN 8 - 23 mg/dL - 128(H) 94(H)  Creatinine 0.44 - 1.00 mg/dL - 4.96(H) 4.24(H)  Sodium 135 - 145 mmol/L - 135 134(L)  Potassium 3.5 - 5.1 mmol/L 5.6(H) 5.6(H) 5.2(H)  Chloride 98 - 111 mmol/L - 103 100  CO2 22 - 32 mmol/L - 17(L) 22  Calcium 8.9 - 10.3 mg/dL - 8.7(L) 9.5  Total Protein 6.5 - 8.1 g/dL - - 6.3(L)  Total Bilirubin 0.3 - 1.2 mg/dL - - 1.1  Alkaline Phos 38 - 126 U/L - - 169(H)  AST 15 - 41 U/L - - 59(H)  ALT 0 - 44 U/L - - 56(H)    CBC Latest Ref Rng & Units 2020/11/15 10/29/2020 10/27/2020  WBC 4.0 - 10.5 K/uL 17.0(H) 21.0(H) -  Hemoglobin 12.0 - 15.0 g/dL  6.9(LL) 8.1(L) 9.2(L)  Hematocrit 36.0 - 46.0 % 23.9(L) 27.9(L) 27.0(L)  Platelets 150 - 400 K/uL 132(L) 141(L) -    ABG    Component Value Date/Time   PHART 7.361 10/27/2020 2235   PCO2ART 43.7 10/27/2020 2235   PO2ART 126 (H) 10/27/2020 2235   HCO3 24.1 10/27/2020 2235   TCO2 25 10/27/2020 2235   ACIDBASEDEF 1.0 10/27/2020 2235   O2SAT 98.0 10/27/2020 2235    CBG (last 3)  Recent Labs    10/29/20 2321 Nov 15, 2020 0337 2020-11-15 0730  GLUCAP 338* 249* 249*    Critical care time: 33 minutes  [name], MD [place] Pager - [phone] 11-15-2020, 8:13 AM

## 2020-11-29 NOTE — Progress Notes (Signed)
[facility] Progress Note   70 year old lady with stage IV chronic kidney disease diabetes mellitus type 2 hyperlipidemia hypertrophic cardiomyopathy presents to the emergency department left lower quadrant pain status postbiopsy 10/07/2020.  Was found to have 8 x 4 x 12 cm subcapsular hematoma.  Late evening 10/14/2020.  With cardiac arrest received 1 mg of epi with ACLS/CPR with ROSC achieved.  Concerns for pulmonary embolus.  Heparin initiated.  Transferred from [facility] to [facility].     Urine output minimal underwent dialysis 10/19/2020 with 1.5 L, 10/20/2021 with 2.5 L removed Status post respiratory arrest.  Requiring intubation.  Patient started on CRRT 10/21/2020  Assessment/ Plan:   1.Acute kidney injury on CKD stage IV: Patient has longstanding CKD with baseline creatinine level seems to be around 2.6-2.9 however recently the GFR declined and creatinine level around 3.6 as outpatient.  Reportedly the biopsy result came back hypertensive and diabetic changes with severe interstitial fibrosis and tubular atrophy with mostly chronic changes.  - Initiated   HD 10/19/2020  and 10/20/2021 before transitioning to  CRRT 10/21/2020-10/27/20 -CRRT stopped on 9/29 due to worsening hypotension and overall poor clinical picture.  Family continues to have a hard time making definitive decision regarding goals of care but did agree to DNR.  Had planned to hold further RRT pending family meeting on 10/3 but patient has shown some neurological improvement and has worsening hyperkalemia and possibly uremia. Therefore, family would like to start back rrt for now.  - Will start CRRT again 500/2000/300 w/ 2K bath - 50-100cc/hr UF   2 Post biopsy left kidney hematoma: 8 x 4 x 12 cm subcapsular hematoma along the left kidney leading to mass-effect on the renal parenchyma.  IR was consulted, no plan for embolization.  Continue to monitor CBC and imaging studies. Stable now.   3 Cardiac arrest  9/16-asystole, transferred from [facility] post arrest w/ brief course of CPR, 1 round of epi   4 Hyponatremia, resolved at this time   6 Metabolic acidosis: Resolved at this time.  Continue to monitor   7 shock: Likely multifactorial.  Continue pressors per primary team   8 Anemia of CKD and blood loss postbiopsy: Persistent anemia now requiring transfusion this morning.  Continue monitoring management per primary team   9.  Ventilator dependent respiratory failure.  With intubation per critical care service  10.  Hyperkalemia: Starting back CRRT as above  Subjective:   The patient continues to be critically ill.  Neurological status somewhat improved and following some commands today.  Hyperkalemia overnight received Lokelma.  Norepinephrine requirements have decreased but continues on norepinephrine and vasopressin.  Anemia requiring transfusion.   Objective:   BP (!) 147/58   Pulse 92   Temp 97.7 F (36.5 C) (Rectal)   Resp (!) 28   Ht _0  (1.626 m)   Wt 83.1 kg   SpO2 100%   BMI 31.45 kg/m   Intake/Output Summary (Last 24 hours) at 11-10-20 0846 Last data filed at Nov 10, 2020 0800 Gross per 24 hour  Intake 2355.63 ml  Output --  Net 2355.63 ml   Weight change: 0.5 kg  Physical Exam: General:intubated, sedation weaned down this am Heart:RRR Lungs:intubated, bl chest expansion, no crackles Abdomen:soft, Non-tender, non-distended Extremities: Bilateral ankle edema present+, RIJ temp Neuro: sedated   Imaging: DG Chest Port 1 View  Result Date: 10/29/2020 CLINICAL DATA:  Respiratory failure.  Hypoxia EXAM: PORTABLE CHEST 1 VIEW COMPARISON:  October 28, 2020 FINDINGS: The  ET tube and right central line are stable. The NG tube terminates below today's film. No pneumothorax. The cardiomediastinal silhouette is stable. Opacity in the right base is stable. Increased opacity in the left mid lower lung. No other interval changes. IMPRESSION: 1. Support apparatus as above. 2.  Stable opacity in the right base. 3. New opacity in the left mid and lower lung could represent layering effusion with underlying atelectasis or developing infiltrate. Recommend attention on follow-up. Electronically Signed   By: Dorise Bullion III M.D.   On: 10/29/2020 06:37    Labs: BMET Recent Labs  Lab 10/24/20 0427 10/24/20 1713 10/25/20 0203 10/25/20 1417 10/26/20 0310 10/26/20 1532 10/27/20 0329 10/27/20 2235 10/28/20 0804 10/29/20 0241 Nov 28, 2020 0127 November 28, 2020 0601  NA 136 136 134* 131* 133* 132* 134* 133* 134* 134* 135  --   K 5.1 6.5* 5.8* 5.0 3.6 3.9 4.9 5.4* 4.7 5.2* 5.6* 5.6*  CL 100 105 98 98 98 97* 102  --  100 100 103  --   CO2 _0 21* _1 --  21* 22 17*  --   GLUCOSE 246* 234* 278* 301* 227* 324* 243*  --  261* 258* 307*  --   BUN 23 30* 29* 23 25* 24* 25*  --  71* 94* 128*  --   CREATININE 1.16* 1.51* 1.33* 1.08* 1.17* 1.24* 1.22*  --  3.36* 4.24* 4.96*  --   CALCIUM 8.7* 8.6* 8.6* 8.5* 8.8* 8.6* 8.9  --  9.3 9.5 8.7*  --   PHOS 2.3* 3.3 3.4 2.8  --  2.5 2.6  --   --   --  8.2*  --    CBC Recent Labs  Lab 10/26/20 0310 10/27/20 0329 10/27/20 2235 10/29/20 0241 2020-11-28 0127  WBC 27.3* 24.6*  --  21.0* 17.0*  NEUTROABS 20.9*  --   --   --   --   HGB 8.4* 8.2* 9.2* 8.1* 6.9*  HCT 28.6* 28.8* 27.0* 27.9* 23.9*  MCV 96.6 99.0  --  100.0 100.0  PLT 171 168  --  141* 132*    Medications:     sodium chloride   Intravenous Once   B-complex with vitamin C  1 tablet Per Tube Daily   calcitRIOL  0.25 mcg Per Tube Daily   chlorhexidine gluconate (MEDLINE KIT)  15 mL Mouth Rinse BID   Chlorhexidine Gluconate Cloth  6 each Topical Daily   docusate  100 mg Per Tube BID   ezetimibe  10 mg Per Tube Daily   febuxostat  40 mg Per NG tube Daily   feeding supplement (PROSource TF)  90 mL Per Tube TID   Gerhardt's butt cream   Topical BID   heparin injection (subcutaneous)  5,000 Units Subcutaneous Q8H   insulin aspart  0-20 Units Subcutaneous Q4H    insulin detemir  24 Units Subcutaneous Q12H   levETIRAcetam  500 mg Per Tube BID   mouth rinse  15 mL Mouth Rinse 10 times per day   pantoprazole sodium  40 mg Per Tube Daily   polyethylene glycol  17 g Per Tube Daily   pravastatin  20 mg Per NG tube QAC breakfast   prednisoLONE acetate  1 drop Left Eye BID   sodium chloride flush  10-40 mL Intracatheter Q12H      Reesa Chew  11-28-2020, 8:46 AM

## 2020-11-29 NOTE — Progress Notes (Signed)
eLink Physician-Brief Progress Note Patient Name: Krista Strickland DOB: 12-11-1950 MRN: [id number]   Date of Service  11-13-2020  HPI/Events of Note  Notified by bedside RN of Hgb 6.9 from 9.1 the day before  eICU Interventions  Patient on norepinephrine and vasopressin No active sign of bleed Ordered to transfuse 1 unit PRBC     Intervention Category Intermediate Interventions: Other:  Judd Lien 11/13/20, 2:21 AM

## 2020-11-29 DEATH — deceased

## 2020-12-13 ENCOUNTER — Ambulatory Visit: Payer: HMO | Admitting: Orthopaedic Surgery

## 2020-12-29 ENCOUNTER — Ambulatory Visit: Payer: HMO | Admitting: Endocrinology

## 2021-08-19 IMAGING — US US BREAST*L* LIMITED INC AXILLA
1 series · 5 of 5 positions shown · non-contrast
Comparison: Previous exam(s).

CLINICAL DATA: 68-year-old female for further evaluation of
possible LEFT breast asymmetry on screening mammogram.

EXAM:
DIGITAL DIAGNOSTIC LEFT MAMMOGRAM WITH TOMO
ULTRASOUND LEFT BREAST

[Series 1: us breast*left* limited inc axilla · 5 of 5 slices shown]
[im 1/5]
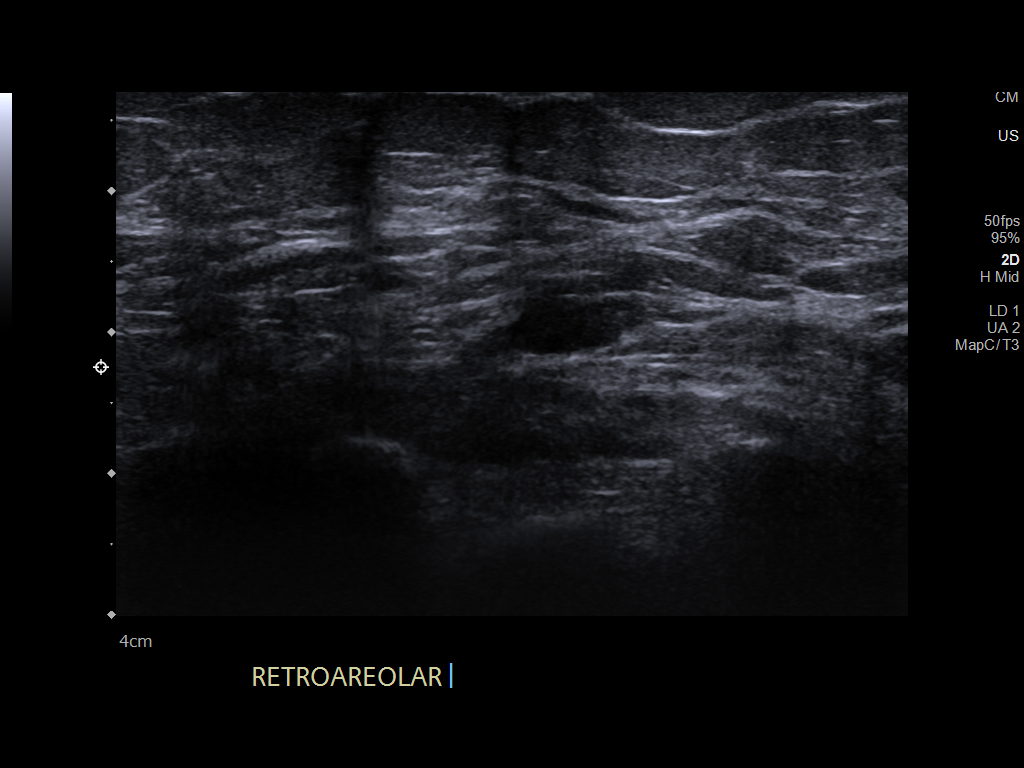
[im 2/5]
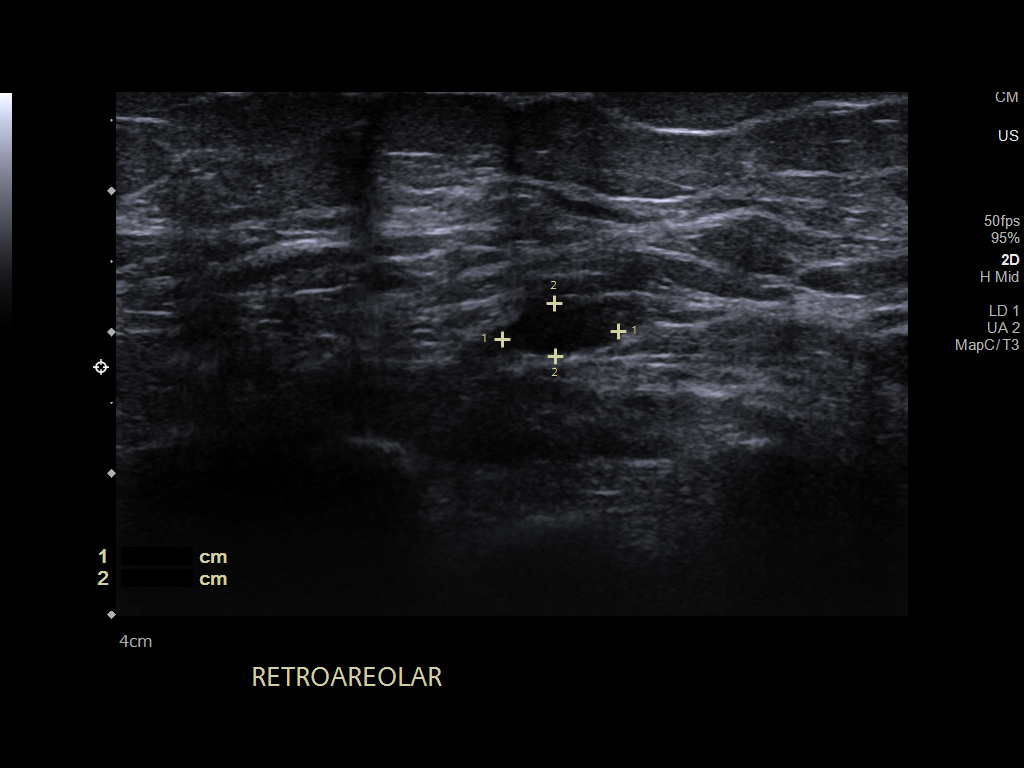
[im 3/5]
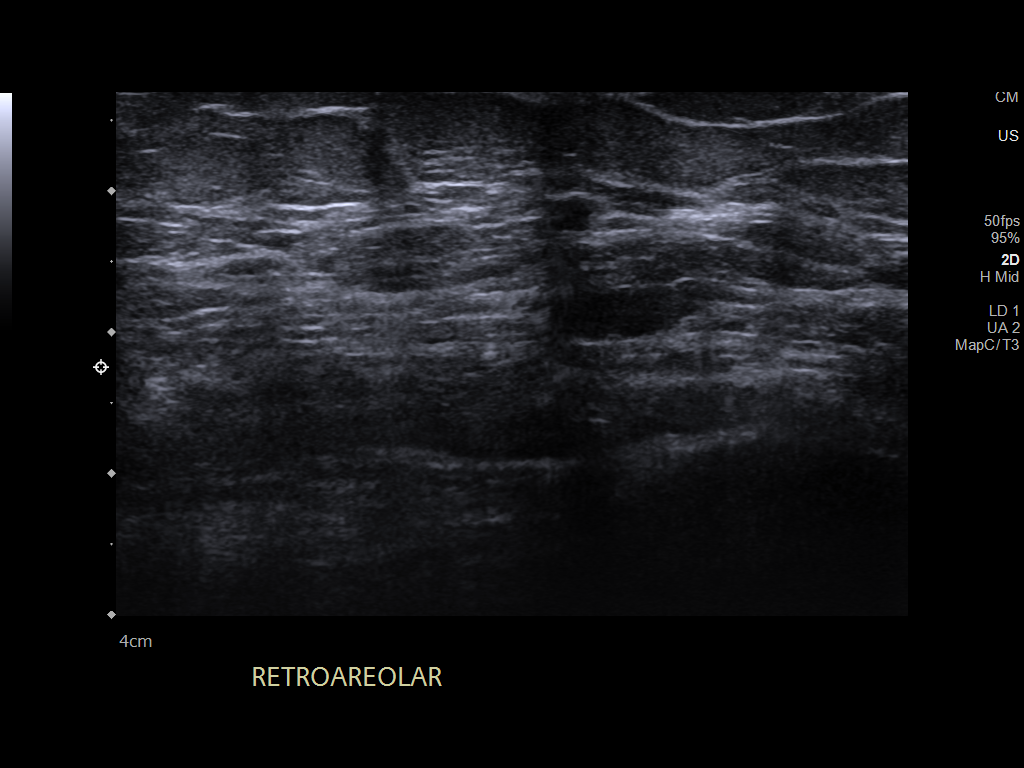
[im 4/5]
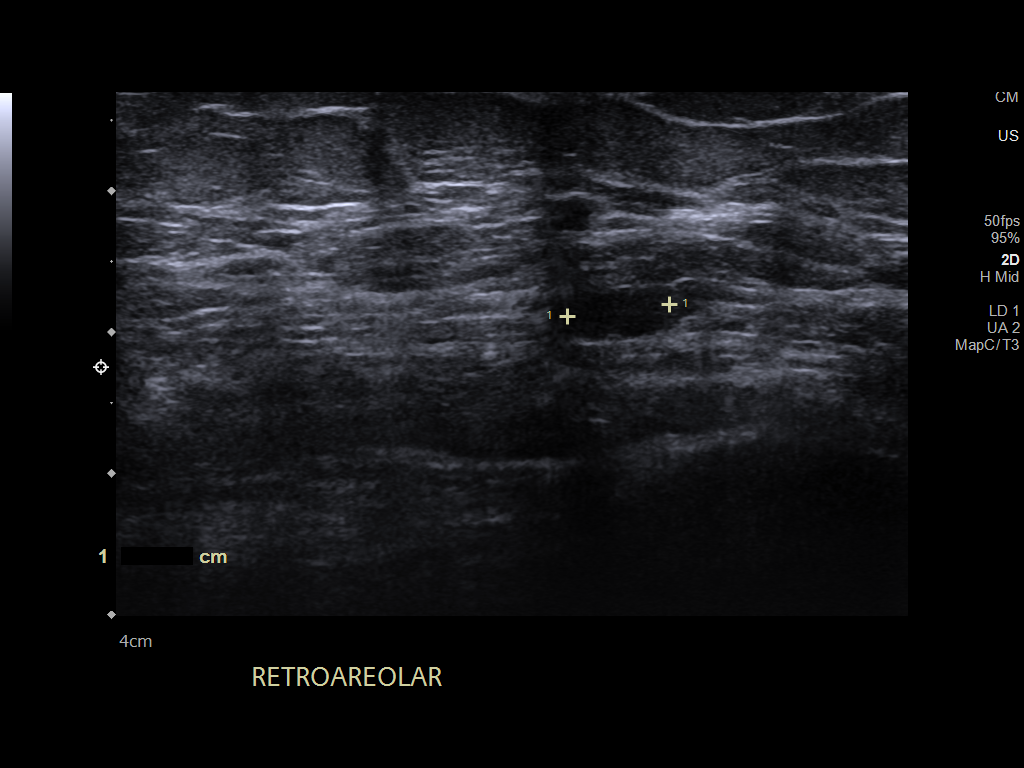
[im 5/5]
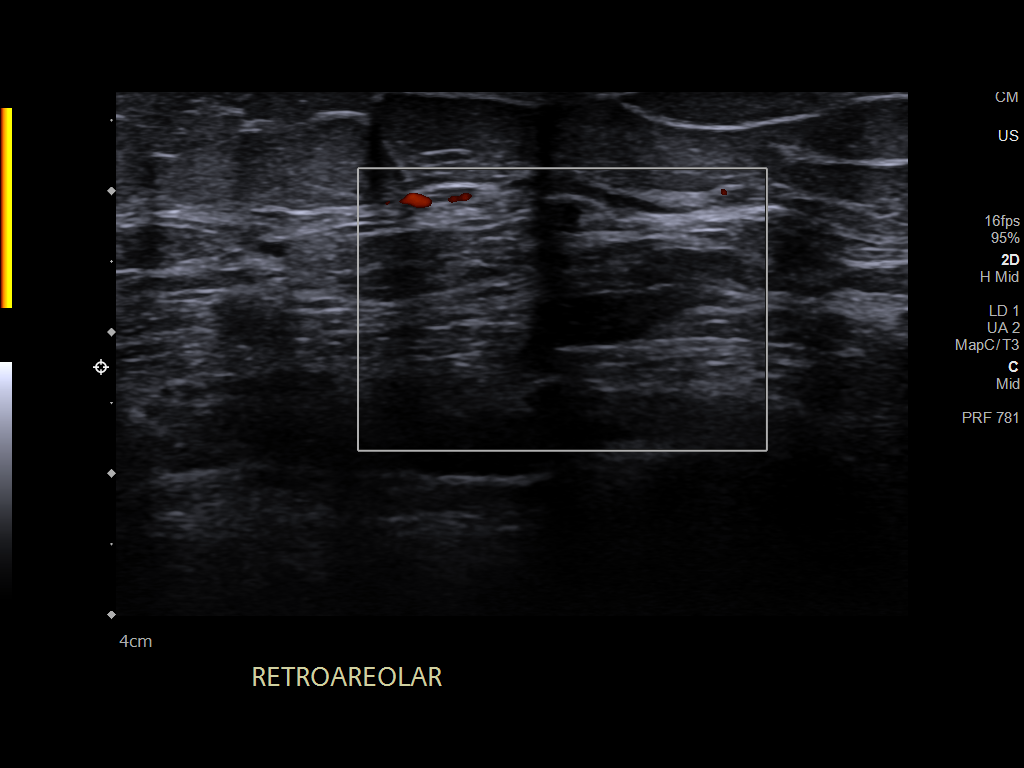

[5 of 5 positions shown; findings below may reference images not displayed]

ACR Breast Density Category b: There are scattered areas of
fibroglandular density.
FINDINGS: 2D/3D spot compression views of the LEFT breast demonstrate a very
faint 0.8 cm oval mass within the posterior RETROAREOLAR LEFT
breast, only visualized on the cc view.

Targeted ultrasound is performed, showing a 0.8 x 0.4 x 0.7 cm
benign cyst in the posterior RETROAREOLAR LEFT breast, corresponding
to the screening study finding.
IMPRESSION: Benign cyst in the posterior RETROAREOLAR LEFT breast corresponding
to the screening study finding.

RECOMMENDATION:
Bilateral screening mammogram in 1 year.

I have discussed the findings and recommendations with the patient.
If applicable, a reminder letter will be sent to the patient
regarding the next appointment.

BI-RADS CATEGORY  2: Benign.
